# Patient Record
Sex: Male | Born: 1940 | Race: White | Hispanic: No | Marital: Single | Smoking: Never smoker
Health system: Southern US, Community
[De-identification: ages and names within clinical notes are randomized; demographics above are authoritative.]

## PROBLEM LIST (undated history)

## (undated) DIAGNOSIS — I5032 Chronic diastolic (congestive) heart failure: Secondary | ICD-10-CM

## (undated) DIAGNOSIS — I480 Paroxysmal atrial fibrillation: Secondary | ICD-10-CM

## (undated) DIAGNOSIS — I2510 Atherosclerotic heart disease of native coronary artery without angina pectoris: Secondary | ICD-10-CM

## (undated) DIAGNOSIS — I10 Essential (primary) hypertension: Secondary | ICD-10-CM

## (undated) DIAGNOSIS — I77810 Thoracic aortic ectasia: Secondary | ICD-10-CM

## (undated) DIAGNOSIS — I872 Venous insufficiency (chronic) (peripheral): Secondary | ICD-10-CM

## (undated) DIAGNOSIS — I472 Ventricular tachycardia, unspecified: Secondary | ICD-10-CM

## (undated) DIAGNOSIS — J939 Pneumothorax, unspecified: Secondary | ICD-10-CM

## (undated) HISTORY — PX: INGUINAL HERNIA REPAIR: SUR1180

## (undated) HISTORY — DX: Pneumothorax, unspecified: J93.9

## (undated) HISTORY — DX: Paroxysmal atrial fibrillation: I48.0

## (undated) HISTORY — DX: Atherosclerotic heart disease of native coronary artery without angina pectoris: I25.10

## (undated) HISTORY — PX: OTHER SURGICAL HISTORY: SHX169

## (undated) HISTORY — DX: Chronic diastolic (congestive) heart failure: I50.32

## (undated) HISTORY — DX: Essential (primary) hypertension: I10

## (undated) HISTORY — DX: Venous insufficiency (chronic) (peripheral): I87.2

## (undated) HISTORY — DX: Ventricular tachycardia, unspecified: I47.20

## (undated) HISTORY — DX: Ventricular tachycardia: I47.2

## (undated) HISTORY — DX: Thoracic aortic ectasia: I77.810

---

## 2001-12-07 ENCOUNTER — Inpatient Hospital Stay (HOSPITAL_COMMUNITY): Admission: EM | Admit: 2001-12-07 | Discharge: 2001-12-13 | Payer: Self-pay | Admitting: Emergency Medicine

## 2002-02-06 ENCOUNTER — Encounter: Payer: Self-pay | Admitting: Urology

## 2002-02-06 ENCOUNTER — Encounter: Admission: RE | Admit: 2002-02-06 | Discharge: 2002-02-06 | Payer: Self-pay | Admitting: Urology

## 2002-03-09 ENCOUNTER — Encounter (INDEPENDENT_AMBULATORY_CARE_PROVIDER_SITE_OTHER): Payer: Self-pay | Admitting: Specialist

## 2002-03-09 ENCOUNTER — Ambulatory Visit (HOSPITAL_BASED_OUTPATIENT_CLINIC_OR_DEPARTMENT_OTHER): Admission: RE | Admit: 2002-03-09 | Discharge: 2002-03-09 | Payer: Self-pay | Admitting: Urology

## 2002-03-09 ENCOUNTER — Ambulatory Visit (HOSPITAL_COMMUNITY): Admission: RE | Admit: 2002-03-09 | Discharge: 2002-03-09 | Payer: Self-pay | Admitting: Urology

## 2002-03-16 ENCOUNTER — Encounter: Payer: Self-pay | Admitting: Urology

## 2002-03-16 ENCOUNTER — Ambulatory Visit (HOSPITAL_COMMUNITY): Admission: RE | Admit: 2002-03-16 | Discharge: 2002-03-16 | Payer: Self-pay | Admitting: Urology

## 2006-06-01 ENCOUNTER — Encounter: Admission: RE | Admit: 2006-06-01 | Discharge: 2006-06-01 | Payer: Self-pay | Admitting: Cardiology

## 2006-06-04 ENCOUNTER — Inpatient Hospital Stay (HOSPITAL_COMMUNITY): Admission: RE | Admit: 2006-06-04 | Discharge: 2006-06-07 | Payer: Self-pay | Admitting: Cardiology

## 2006-10-20 ENCOUNTER — Ambulatory Visit: Payer: Self-pay | Admitting: Internal Medicine

## 2007-01-05 ENCOUNTER — Ambulatory Visit: Payer: Self-pay | Admitting: Internal Medicine

## 2007-05-11 ENCOUNTER — Ambulatory Visit: Payer: Self-pay | Admitting: Internal Medicine

## 2007-11-11 ENCOUNTER — Ambulatory Visit: Payer: Self-pay | Admitting: Internal Medicine

## 2008-05-10 ENCOUNTER — Ambulatory Visit: Payer: Self-pay | Admitting: Internal Medicine

## 2008-06-17 ENCOUNTER — Encounter: Admission: RE | Admit: 2008-06-17 | Discharge: 2008-06-17 | Payer: Self-pay | Admitting: Family Medicine

## 2008-06-18 ENCOUNTER — Encounter: Admission: RE | Admit: 2008-06-18 | Discharge: 2008-06-18 | Payer: Self-pay | Admitting: Family Medicine

## 2008-08-13 ENCOUNTER — Encounter: Admission: RE | Admit: 2008-08-13 | Discharge: 2008-08-13 | Payer: Self-pay | Admitting: Cardiology

## 2008-08-16 ENCOUNTER — Encounter: Admission: RE | Admit: 2008-08-16 | Discharge: 2008-08-16 | Payer: Self-pay | Admitting: Dermatology

## 2009-05-09 ENCOUNTER — Ambulatory Visit: Payer: Self-pay | Admitting: Internal Medicine

## 2009-05-09 DIAGNOSIS — I10 Essential (primary) hypertension: Secondary | ICD-10-CM | POA: Insufficient documentation

## 2009-05-09 DIAGNOSIS — I4891 Unspecified atrial fibrillation: Secondary | ICD-10-CM

## 2009-05-31 ENCOUNTER — Encounter: Payer: Self-pay | Admitting: Internal Medicine

## 2009-07-08 ENCOUNTER — Inpatient Hospital Stay (HOSPITAL_COMMUNITY): Admission: RE | Admit: 2009-07-08 | Discharge: 2009-07-11 | Payer: Self-pay | Admitting: Orthopedic Surgery

## 2010-05-27 ENCOUNTER — Ambulatory Visit: Payer: Self-pay | Admitting: Internal Medicine

## 2010-05-27 DIAGNOSIS — I495 Sick sinus syndrome: Secondary | ICD-10-CM

## 2010-08-10 ENCOUNTER — Encounter: Payer: Self-pay | Admitting: Cardiology

## 2010-08-19 NOTE — Assessment & Plan Note (Signed)
Summary: per check out/sf   Visit Type:  1 year follow up  CC:  no complaints.  History of Present Illness: [name] is seen in followup for paroxysmal atrial fibrillation occurring in the setting of hypertension. He has had very few palpitation problems over the last year.  He denies chest pain.  He has gone through his knee and hip surgery and he is anticipating going skiing again this winters.  His heart rate and blood pressures the gym regularly have run 120-130 and pulse is 58-60 with an occasional 40  Problems Prior to Update: 1)  Hypertension, Benign  (ICD-401.1) 2)  Atrial Fibrillation  (ICD-427.31)  Current Medications (verified): 1)  Folic Acid 1 Mg Tabs (Folic Acid) .... Once Daily 2)  Flomax 0.4 Mg Caps (Tamsulosin Hcl) .... Two Times A Day 3)  Aspirin 81 Mg Tabs (Aspirin) .... Once Daily 4)  Clonidine Hcl 0.1 Mg Tabs (Clonidine Hcl) .... Two Times A Day 5)  Lipitor 20 Mg Tabs (Atorvastatin Calcium) .... Once Daily 6)  Pindolol 5 Mg Tabs (Pindolol) .... Two Times A Day 7)  Epa 1000 Mg Caps (Omega-3 Fatty Acids) .... Once Daily 8)  Calcium-Vitamin D 500-200 Mg-Unit Tabs (Calcium-Vitamin D) .... Once Daily 9)  Century  Tabs (Multiple Vitamins-Minerals) .... Once Daily 10)  Tylenol Extra Strength 500 Mg Tabs (Acetaminophen) .... 2 Daily 11)  Glucosamine 500 Mg Caps (Glucosamine Sulfate) .... Once Daily  Allergies (verified): No Known Drug Allergies  Past History:  Past Medical History: Last updated: 05/08/2009  Atrial fibrillation - paroxysmal  Coronary artery disease status post bare metal stent to the right coronary artery  Hypertension  Past Surgical History: Last updated: 05/08/2009 Status post inguinal hernia repair x2 in the 1950s and 1970s.  Family History: Last updated: 05/08/2009  Insignificant for early coronary artery disease.  Father deceased, age 80 - natural causes.  Mother deceased, age 60 - natural causes.  Social History: Last  updated: 05/08/2009 Retired  Single  Tobacco Use - No.  Alcohol Use - no Regular Exercise - yes Drug Use - no  Risk Factors: Exercise: yes (05/08/2009)  Risk Factors: Smoking Status: never (05/08/2009)  Vital Signs:  Patient profile:   70 year old male Height:      74 inches Weight:      165.25 pounds BMI:     21.29 Pulse rate:   44 / minute BP sitting:   150 / 70  (left arm) Cuff size:   regular  Vitals Entered By: Caralee Ates CMA (May 27, 2010 9:55 AM)  Physical Exam  General:  The patient was alert and oriented in no acute distress. HEENT Normal.  Neck veins were flat, carotids were brisk.  Lungs were clear.  Heart sounds were regular without murmurs or gallops.  Abdomen was soft with active bowel sounds. There is no clubbing cyanosis or edema. Skin Warm and dry there is prominence of his right scapula   EKG  Procedure date:  05/27/2010  Findings:      sinus rhythm at 48 Intervals 0.18/0.10/0.43 Axis is 77  Impression & Recommendations:  Problem # 1:  CAD, NATIVE VESSEL PRIOR BMS (ICD-414.01) stable continue current meds His updated medication list for this problem includes:    Aspirin 81 Mg Tabs (Aspirin) ..... 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 Once daily    Pindolol 5 Mg Tabs (Pindolol) .Marland Kitchen..Marland Kitchen Two times a day  Problem # 3:  ATRIAL FIBRILLATION (ICD-427.31) no intercurrent symptoms ofatrial fibrillation .b CHADS VASC score is 3- age-57;htn-1, vasc dis-1;  Will need to discuss oral anticoagulation at his next visit His updated medication list for this problem includes:    Aspirin 81 Mg Tabs (Aspirin) ..... Once daily    Pindolol 5 Mg Tabs (Pindolol) .Marland Kitchen..Marland Kitchen Two times a day  Problem # 4:  HYPERTENSION, BENIGN  (ICD-401.1) His blood pressure is modestly elevated. Intercurrently his hydrochlorothiazide has been discontinued. We will continue to monitor it     Patient Instructions: 1)  Your physician recommends that you continue on your current medications as directed. Please refer to the Current Medication list given to you today. 2)  Your physician wants you to follow-up in:  1year You will receive a reminder letter in the mail two months in advance. If you don't receive a letter, please call our office to schedule the follow-up appointment.

## 2010-10-20 LAB — URINALYSIS, ROUTINE W REFLEX MICROSCOPIC
Bilirubin Urine: NEGATIVE
Glucose, UA: NEGATIVE mg/dL
Hgb urine dipstick: NEGATIVE
Ketones, ur: NEGATIVE mg/dL
Nitrite: NEGATIVE
Nitrite: POSITIVE — AB
Protein, ur: 100 mg/dL — AB
Protein, ur: NEGATIVE mg/dL
Specific Gravity, Urine: 1.02 (ref 1.005–1.030)
Urobilinogen, UA: 0.2 mg/dL (ref 0.0–1.0)
pH: 6.5 (ref 5.0–8.0)

## 2010-10-20 LAB — BASIC METABOLIC PANEL
BUN: 9 mg/dL (ref 6–23)
BUN: 9 mg/dL (ref 6–23)
CO2: 27 mEq/L (ref 19–32)
Calcium: 8.1 mg/dL — ABNORMAL LOW (ref 8.4–10.5)
Creatinine, Ser: 1.27 mg/dL (ref 0.4–1.5)
GFR calc Af Amer: >60 mL/min (ref >60)
GFR calc Af Amer: >60 mL/min (ref >60)
GFR calc Af Amer: >60 mL/min (ref >60)
GFR calc non Af Amer: 54 mL/min — ABNORMAL LOW (ref >60)
GFR calc non Af Amer: 56 mL/min — ABNORMAL LOW (ref >60)
GFR calc non Af Amer: >60 mL/min (ref >60)
Potassium: 3.8 mEq/L (ref 3.5–5.1)
Potassium: 4 mEq/L (ref 3.5–5.1)
Sodium: 136 mEq/L (ref 135–145)

## 2010-10-20 LAB — URINE MICROSCOPIC-ADD ON

## 2010-10-20 LAB — CBC
HCT: 26.7 % — ABNORMAL LOW (ref 39.0–52.0)
Hemoglobin: 9.3 g/dL — ABNORMAL LOW (ref 13.0–17.0)
Platelets: 119 10*3/uL — ABNORMAL LOW (ref 150–400)
Platelets: 131 10*3/uL — ABNORMAL LOW (ref 150–400)
RBC: 2.97 MIL/uL — ABNORMAL LOW (ref 4.22–5.81)
RBC: 3.09 MIL/uL — ABNORMAL LOW (ref 4.22–5.81)
RBC: 3.25 MIL/uL — ABNORMAL LOW (ref 4.22–5.81)
WBC: 11 10*3/uL — ABNORMAL HIGH (ref 4.0–10.5)
WBC: 7.8 10*3/uL (ref 4.0–10.5)

## 2010-10-20 LAB — TYPE AND SCREEN: ABO/RH(D): O POS

## 2010-10-20 LAB — PROTIME-INR
INR: 1.48 (ref 0.00–1.49)
INR: 2.72 — ABNORMAL HIGH (ref 0.00–1.49)
Prothrombin Time: 17.8 seconds — ABNORMAL HIGH (ref 11.6–15.2)
Prothrombin Time: 39 seconds — ABNORMAL HIGH (ref 11.6–15.2)

## 2010-10-20 LAB — URINE CULTURE: Culture: NO GROWTH

## 2010-10-20 LAB — ABO/RH: ABO/RH(D): O POS

## 2010-10-21 LAB — CBC
Hemoglobin: 12.1 g/dL — ABNORMAL LOW (ref 13.0–17.0)
MCHC: 33 g/dL (ref 30.0–36.0)
Platelets: 171 10*3/uL (ref 150–400)
RDW: 14.7 % (ref 11.5–15.5)

## 2010-10-21 LAB — COMPREHENSIVE METABOLIC PANEL
ALT: 23 U/L (ref 0–53)
Albumin: 4 g/dL (ref 3.5–5.2)
Alkaline Phosphatase: 64 U/L (ref 39–117)
Calcium: 9.6 mg/dL (ref 8.4–10.5)
Glucose, Bld: 94 mg/dL (ref 70–99)
Potassium: 4.5 mEq/L (ref 3.5–5.1)
Sodium: 141 mEq/L (ref 135–145)
Total Protein: 7 g/dL (ref 6.0–8.3)

## 2010-10-21 LAB — PROTIME-INR
INR: 1.14 (ref 0.00–1.49)
Prothrombin Time: 14.5 seconds (ref 11.6–15.2)

## 2010-12-02 NOTE — Assessment & Plan Note (Signed)
[facility]                         ELECTROPHYSIOLOGY OFFICE NOTE   [name], [name]                       MRN:          [id number]  DATE:01/05/2007                            DOB:          [birth year]    Mr. [name] comes in.  He is having a little bit of atrial palpitations.  He records his heart rate on a daily basis.  Sometimes it is 100,  sometimes it is 50.  His energy level is not much different since we  stopped the Sotalol, notwithstanding the bradycardia.   He is also off the Coumadin.  He is taking aspirin, as well as Vytorin.   EXAMINATION:  His blood pressure is 118/68, his pulse is 65.  LUNGS:  Clear.  HEART SOUNDS:  Regular.   EKG, dated today, demonstrated sinus rhythm at about 55 with occasional  atrial couplets.   IMPRESSION:  1. Atrial fibrillation - paroxysmal.  2. Coronary artery disease with prior stenting and normal left      ventricular function.  3. Relative bradycardia, aggravated by Sotalol.   Mr. [name] is doing fine.  We will plan to see him again in about four  months' time.  In the event that he has more atrial fibrillation, we  will consider the initiation of Tikosyn in the hopes of trying to  maintain his electrical milieu in the atrium so as to minimize the  likelihood of down-the-road atrial fibrillation or persistent atrial  fibrillation, which might detract from success rates for PVI further  down the road.     Duke Salvia, MD, [facility]  Electronically Signed    SCK/MedQ  DD: 01/05/2007  DT: 01/05/2007  Job #: [id number]   cc:   Armanda Magic, M.D.

## 2010-12-02 NOTE — Assessment & Plan Note (Signed)
[name]                         ELECTROPHYSIOLOGY OFFICE NOTE   [name], [name]                       MRN:          [id number]  DATE:05/10/2008                            DOB:          11-29-1940    [name] continues to do well with his atrial fibrillation.  He has  had a little bit of late-day fatigue.  He is not sure whether this is  related to the pindolol or the fact that he is 67 and exercises briskly  each morning.   He is scheduled to go skiing 23 days this winter in the [place].   CURRENT MEDICATIONS:  1. Multiple nutraceuticals.  2. He is also on hydrochlorothiazide 25 mg a day.  3. Lisinopril 5.  4. Aspirin.  5. Vytorin.   PHYSICAL EXAMINATION:  VITAL SIGNS:  His blood pressure is quite  elevated today for him of 160/90; his pulse was 50; and his weight was  170, which is relatively stable.  NECK:  Veins were 7 cm.  LUNGS:  Clear.  HEART:  Sounds were irregular without significant murmurs.  ABDOMEN:  Soft.  EXTREMITIES:  Peripheral edema is 1 to 2+, which was a surprise.   Electrocardiogram dated today demonstrated a sinus rhythm at 51 with  intervals of 0.18/0.09/0.43.  The electrocardiogram was otherwise  normal.   IMPRESSION:  1. Paroxysmal atrial fibrillation.  2. Moderate hypertension.  3. Peripheral edema, question cause.  4. Modest end of the day fatigue.  5. Pindolol therapy for the above.  6. Aspirin therapy for thrombosed embolic prophylaxis.  7. CHADS score of 2.   [name] atrial fibrillation is not particularly asymptomatic.  Continuing on his current medical therapy makes sense.   The more alarming issue is his edema in the context of his hypertension.  This may well be related to increase salt intake as he got back from the  beach where he had been eating pretzels and and chips etc.   I have given him prescription for Lasix 20 to use as an alternative to  his hydrochlorothiazide  for 3 days in the event that modification of his  diet by decreasing salt intake, does not have intermediation effect on  his edema.   We will plan to see him again in 1 year's time.  He will be followed up  with his other physicians as scheduled.     Duke Salvia, MD, [facility]  Electronically Signed    SCK/MedQ  DD: 05/10/2008  DT: 05/11/2008  Job #: [id number]   cc:   Armanda Magic, M.D.

## 2010-12-02 NOTE — Assessment & Plan Note (Signed)
[facility]                         ELECTROPHYSIOLOGY OFFICE NOTE   [name], [name]                       MRN:          [id number]  DATE:05/11/2007                            DOB:          Nov 21, 1940    [name] comes in for follow-up for atrial fibrillation.  He is  largely asymptomatic.  He is currently taking no medications for this.   He continues to exercise, skiing and playing tennis.   On examination, his blood pressure is 134/70, his pulse is 45.  His lungs were clear.  Heart sounds were regular now.  The extremities were without edema.   Electrocardiogram dated today demonstrated sinus rhythm at 75, actually  probably in the range of 45 with intermittent bursts of an atrial  tachycardia, the P wave of which was monophasic, upright in lead V1 and  biphasic in V5.   IMPRESSION:  1. Atrial fibrillation with runs of a nonsustained atrial tachycardia.  2. Intermittent sinus bradycardia.  3. Hypertension.   [name] is doing really quite well.  He has no symptoms.  We had  discussed pursuing further treatment of his atrial arrhythmia but in the  absence of symptoms and the lack of data demonstrating that we can  improve outcome, I think we are a little bit premature in this regard.   We will plan to see him again in 6 months' time.  Maybe there will be  some data at that time demonstrating mortality benefit with early  pulmonary vein isolation, at which point that would be an appropriate  consideration.     Duke Salvia, MD, [facility]  Electronically Signed    SCK/MedQ  DD: 05/11/2007  DT: 05/12/2007  Job #: [id number]

## 2010-12-02 NOTE — Assessment & Plan Note (Signed)
[facility]                         ELECTROPHYSIOLOGY OFFICE NOTE   DARWIN, ROTHLISBERGER                       MRN:          [id number]  DATE:11/11/2007                            DOB:          Jan 17, 1941    Mr. [name] is doing pretty well on his atrial fibrillation.  He was  recently started on pindolol.  He has had a little bit of fatigue, but  nothing too noticeable.  He continues to ski, in fact just got back.   His other medications are hydrochlorothiazide 25, lisinopril 5.  He is  also on Vytorin 10/20.   EXAMINATION:  His blood pressure is 142/84, pulse is 58.  LUNGS:  Clear.  HEART:  Heart sounds were regular.  EXTREMITIES:  Without edema.   IMPRESSION:  1. Paroxysmal atrial fibrillation.  2. Hypertension.   Mr. [name] will continue on the current therapy.  We will plan to sit  down again in about six months' time to see if there are any hard data  on strategies for catheter ablation to reduce hard end points.     Duke Salvia, MD, [facility]  Electronically Signed    SCK/MedQ  DD: 11/11/2007  DT: 11/11/2007  Job #: [id number]   cc:   Armanda Magic, M.D.

## 2010-12-05 NOTE — Discharge Summary (Signed)
[place]  Patient:    [name], [name] Visit Number: [id number] MRN: [id number]          Service Type: MED Location: [id number] Attending Physician:  Armanda Magic Dictated by:   Anselm Lis, N.P. Admit Date:  12/07/2001 Discharge Date: 12/13/2001   CC:         Doylene Canning. Ladona Ridgel, M.D. [facility]  Florencia Reasons, M.D.  Veverly Fells. Arletha Grippe, M.D.  Sigmund I. Patsi Sears, M.D.  Rosanne Sack, M.D.   Discharge Summary  DATE OF BIRTH:  12-09-1940  CONSULTANTS: 1. Doylene Canning. Ladona Ridgel, M.D. [facility], electrophysiology. 2. Florencia Reasons, M.D., gastroenterology. 3. Veverly Fells. Arletha Grippe, M.D., head, eyes, ears, nose, and throat. 4. Sigmund I. Patsi Sears, M.D. (urology). 5. Rosanne Sack, M.D., [facility] hospitalist.  PROCEDURES: 1. Transfusion of a total of four units PRBCs. 2. (Dec 08, 2001) A 2-D echocardiogram revealing hyperdynamic LV systolic    function, EF 75-85%.  Trivial aortic valve regurgitation. 3. (Dec 08, 2001) Dr. Matthias Hughs: EGD revealing erosive/hemorrhagic gastritis    and incidental large hiatal hernia. 4. (Dec 07, 2001) Head CT without contrast revealing no acute intracranial    abnormality.  There is an air-fluid level within the right maxillary sinus    which could represent blood.  DISCHARGE DIAGNOSES:  1. Supraventricular tachycardia.  The patient with a history of palpitations     and irregular heart rate in the past.  On day of admission dizziness and     palpitations as well as nosebleed.  Presented to the emergency room and     was in supraventricular tachycardia, rate of 210 beats per minute,     associated with nasal and oropharyngeal bleeding.  He continued with     tachypalpitations in hospital and was placed on combination beta-blocker     and calcium channel blockers with improvement of his symptoms.  He     converted to normal sinus rhythm.  Admission electrocardiogram     demonstrated what appeared  to be long RP tachycardia at 210 beats per     minute.  Electrophysiology consultation obtained from Dr. Lewayne Bunting who     discussed medical therapy versus catheter ablation of his supraventricular     tachycardia.  The patient expressed preference for medical therapy with     calcium channel blocker and beta blockers as he had done fairly well with     only palpitations.  Dr. Ladona Ridgel felt that his supraventricular tachycardia     appeared to be atrial tachycardia, although he could not rule out the     unusual form of atrioventricular reciprocating tachycardia or a concealed     pathway.  The patient will follow up with Dr. Ladona Ridgel in six weeks     postdischarge.  The patient did have intermittent bursts of atrial     tachycardia versus unusual atrioventricular nodal reentry tachycardia.     His Cardizem was increased to 300 a day and Lopressor 50 mg b.i.d. at time     of discharge.  The patient had a 2-D echocardiogram which revealed     hyperdynamic systolic function, but it was an inadequate study for     evaluation of left ventricular wall motion.  There was trivial aortic     regurgitation.  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  The location of the outflow obstruction     (outflow tract versus midcavity) could not be definitely determined.  TSH     obtained and was within normal range at 0.513.  2. Anemia with gastrointestinal bleed/nasal bleed.  Prior to admission the     patient had hematemesis and subsequent nasal bleed, became presyncopal and     bumped his head on the kitchen floor as he tried to lower himself to the     floor.  Ulcer risk factors include a daily aspirin plus Naprosyn a few     times a week plus fairly regular heavy ethyl alcohol use.  During the     hospital course he did have melanotic stool.  His admission hemoglobin was     11.7 and  dropped to 8.0 on the morning of first hospital day.  Despite     blood transfusion with two units of PRBCs and increase of hemoglobin to     9.4, it again drifted downward to a low of 7.4.  He received two more     units of PRBCs and was discharged with a hemoglobin of 8.9 at time of     discharge without further melanotic stool nor nasal bleed.  He underwent     endoscopy by Dr. Matthias Hughs revealing erosive/hemorrhagic gastritis, question     culprit for his bleed.  There was an incidental large hiatal hernia     identified.  He was initiated on proton pump inhibitor b.i.d. and folic     acid was also added to regimen.  He will follow up with Dr. Matthias Hughs on     Tuesday, June 24, at noon.  The patient will have a followup CBC on     Wednesday, May 28.  Also anticipate a screening colonoscopy as an     outpatient.  3. Nasal bleed.  Nose initially packed at time of admission.  Dr. Arletha Grippe of     ears, eyes, nose and throat did, I believe, place essential packing.  The     nosebleed did resolve and it was without problems by the time of     discharge.  He will follow up with Dr. Arletha Grippe as an outpatient.  His head     CT did reveal air-fluid level within the right maxillary sinus which could     represent blood.  No other acute abnormality.  4. Ethyl alcohol abuse.  The patient had increased use of alcohol apparently     as much as one to two bottles of wine a day for the last several years.     He was initiated on the alcohol abuse detoxication pharmacy protocol and     internal medicine (Dr. Nolon Rod Monguilod, [place] hospitalist) was      consulted.  Dr. Jamie Brookes mentioned that patients potassium, magnesium     and phosphorus abnormalities were typically low in ETOH abuse.  He was     referred to AA as outpatient and this information was provided to him.  5. Hypokalemia.  Supplemented.  His serum potassium was as low as 3.1 and     3.4 at time of discharge.  He will have a followup BMET  on Wednesday,     May 28.  The patient discharged on K-Dur 40 mEq p.o. q.d.  6. Thrombocytopenia.  Admission platelets of 189 as well as 103, 151 at time     of discharge.  A followup CBC as outpatient  scheduled Wednesday, May 28.  7. Low-grade temperature.  As high as 101.6 on Dec 09, 2001, post transfusion     of PRBCs which may have been the culprit.  The patient remained afebrile     during the rest of hospital admission.  His admission WBC was 13.0,     drifting down but then as high as 14.1; 5.8 at time of discharge.  He did     have an initial left shift, but this may have been a stress response.     Neutrophils had normalized by the time of discharge.  8. Hematuria.  Consult obtained by Dr. Lynelle Smoke I. Tannenbaum who noted that     patient had an elevated PSA of 12.1 done screening examination.  Dr.     Patsi Sears also noted that patient had a history of gross hematuria which     had never been evaluated.  Dr. Patsi Sears discussed followup with the     patient and family and plans for a complete urologic evaluation were     forgone until after his daughters wedding which would occurring in     approximately three weeks.  Plan for patient to return to see Dr.     Patsi Sears for repeat PSA as well as a CT and flexible office cystoscopy.     This would allow the patient to intervally obtain treatment for possible     alcoholic withdrawal as well as for his tachyarrhythmia.  9. Admission hyperglycemia.  Glucose as high as 226, 108 at time of     discharge.  His hemoglobin A1c was okay at 5.4.  He was counseled on a     no-concentrated-sweet diet. 10. Symptoms of anxiety.  Started on Celexa. 11. Hypertension.  Anticipate improvement with increasing of Cardizem.  PLAN: 1. The patient discharged home in improved condition. 2. Discharge medications.    A. Protonix 40 mg one p.o. b.i.d.    B. Thiamine (vitamin B1) 100 mg p.o. q.d.    C. Folic acid 1 mg p.o. q.d.    D. Multivitamin once  daily.    E. Lopressor 50 mg one tab p.o. b.i.d.    F. K-Dur 40 mEq p.o. q.d.    G. Celexa 10 mg p.o. q.d. for one week, then 20 mg p.o. q.d.    H. Cardizem CD 300 mg one p.o. q.d. 2. Activity: As tolerated as before. 3. Diet: No concentrated sweets. 4. Followup: Dr. Lewayne Bunting [phone]); the patient to call to be seen    within 4-6 weeks.  Dr. Matthias Hughs [phone]) on Tuesday, June 24, at 12 noon.    Dr. Patsi Sears [phone]) on June 30, at 3:10 p.m.  Dr. Armanda Magic on    Thursday, May 29, at 11:45 a.m.  The patient will report to the office of    Dr. Armanda Magic, [address], Wednesday, May 28, for lab work including a    CBC and BMET.  HISTORY OF PRESENT ILLNESS:  The patient is a very pleasant 70 year old gentleman with a history of palpitations and irregular heartbeat in the past. He also has a history of hypertension and an inguinal hernia repair.  He has had no previous cardiac history and has not seen a medical doctor in 10 years. On the day of admission he awoke with nasal bleed.  Got out of bed with nose intermittently bleeding.  Approximately two hours later he felt dizzy and had presyncopal episode.  He hit his head on the kitchen counter  as he was catching himself to lower himself to the floor.  He did not lose consciousness.  He denied headache or seizure activity.  EMS arrived, blood pressure 150/88, heart rate 102.  His nose was bleeding.  In the ER he had bilateral nasal packing and later went into supraventricular tachycardia at 209 beats per minute which was asymptomatic.  He was given 6 mg adenosine which slowed him down, revealing question sinus tachycardia with PACs versus atrial fibrillation/futter.  He was started on an IV Cardizem drip and went into sinus tachycardia.  Further details of hospital admission as detailed above.  LABORATORY TESTS AND DATA:  Admission WBC 13.0, peak of 14.1, 5.8 at time of discharge.  Initial left shift, normalizing by time of  discharge.  Admission hemoglobin of 11.7 with hematocrit 34.4, as low as 7.4 on May 25; 8.9 at time of discharge.  Platelets on admission 189, as low as 103; 151 at time of discharge.  Admission pro time 13.9, INR of 1.0.  Admission sodium 143. Admission potassium of 4; as low as 3.1; supplemented; 3.4 at time of discharge.  Chloride was 108, CO2 22.  Glucose on admission 226 which was the highest he exhibited; 108 at time of discharge.  Admission calcium of 8.0, 8.7 at time of discharge.  Total protein and albumin decreased at 5.2 and 2.9, respectively.  AST elevated at 62, normalizing to 33.  Other LFTs within normal range save for low-normal magnesium at 1.7 and low phosphorus at 1.8. Hemoglobin A1c within normal range at 5.4.  CK of 218 with MB fraction 2.4, troponin I 0.04; second CK of 265, MB fraction 1.3, troponin I 0.03; third CK of 305, MB fraction 2.3, troponin I 0.04.  Cholesterol 159, triglycerides 87, HDL 70, LDL 72.  TSH within normal range at 0.513.  Serum iron elevated at 274, TIBC not calculated, RBC/folate elevated at 679.  PSA elevated at 12.10. UA negative.  Type and cross-match revealed ABO type O, Rh positive, antibody screen negative.  Admission chest x-ray revealed no active CAD.  Total time period for discharge greater than 40 minutes including filling out and reviewing discharge instructions with the patient, calling of prescriptions, dictating this discharge note. Dictated by:   Anselm Lis, N.P. Attending Physician:  Armanda Magic DD:  01/27/02 TD:  01/27/02 Job: [id number]

## 2010-12-05 NOTE — Cardiovascular Report (Signed)
NAMECHER, EGNOR NO.:  [id number]   MEDICAL RECORD NO.:  [id number]          PATIENT TYPE:  OIB   LOCATION:  2899                         FACILITY:  [facility]   PHYSICIAN:  Lyn Records, M.D.   DATE OF BIRTH:  [birth year]   DATE OF PROCEDURE:  06/04/2006  DATE OF DISCHARGE:                            CARDIAC CATHETERIZATION   INDICATIONS FOR PROCEDURE:  Early positive exercise treadmill test with  high grade mid to distal right coronary artery stenosis.   PROCEDURE PERFORMED:  1. Percutaneous transluminal coronary angiography of the right      coronary.  2. Intravascular ultrasound of the right coronary.  3. Bare metal stent overlapping implantation in the mid to distal      right coronary artery.   DESCRIPTION:  After informed consent, a 7-French sheath was exchanged  for the 6-French sheath that had been previously placed.  We also placed  a 6-French venous sheath.  We had concerns about heavy calcification  noted on cinefluoroscopy throughout the patient's entire coronary bed.  We attempted to intravascular ultrasound pre-dilatation, but were unable  to cross the lesion with the ultrasound catheter.  We predilated with a  2-mm Quantum balloon.  We then did intravascular ultrasound to size the  distal and proximal lesion segments and also to look at the distribution  of calcium within the vessel.  Calcium was deep.  The distal and  proximal margins were in the 3.75 to 4.1 mm diameter size.  The lesion  was felt to be approximately 14 mm in length.   We then deployed a 16 mm x 3.5 mm Liberty balloon to 14 atmospheres, two  balloon inflations were performed.  There was a region distal to the  stented area margin that was somewhat hazy and a second 12 mm x 3.5  Liberty was overlapped with the first stent.  Post dilatation was then  performed with a 4 x 12-mm Quantum balloon to 14 atmospheres.  The  distal margin of the mid vessel and the proximal margin  were stented.  The patient tolerated the procedure without complications.  IVUS was  done post procedure and the minimal luminal diameter within the stented  segment was nearly 9 mm square.  Angiographically, the lesion was  reduced from 95% to 0% with TIMI grade III flow.   The patient received 5600 units of IV heparin and intravenous Integrilin  double bolus followed by an infusion.  The patient also received 300 mg  of Plavix.  No significant complications occurred.   Atlantis intravascular ultrasound catheter was used.  Asahi Prowater  guidewire was used. A 7-French side-hole JR-4 guide catheter was used.  A 2 mm x 12 mm Quantum predilatation balloon was used.   CONCLUSION:  Successful stent of the mid to distal right coronary artery  from 99% to 0% with TIMI III flow with final post dilatation with a 4  balloon and minimal luminal area of approximately 9 mm square post  procedure.   PLAN:  Aspirin and Plavix.  The Plavix should be continued at least  three weeks.  Further  management per Dr. Mayford Knife.      Lyn Records, M.D.  Electronically Signed     HWS/MEDQ  D:  06/04/2006  T:  06/04/2006  Job:  [id number]   cc:   Ladell Pier, M.D.  Armanda Magic, M.D.

## 2010-12-05 NOTE — Consult Note (Signed)
[facility]  Patient:    [name], [name] Visit Number: [id number] MRN: [id number]          Service Type: MED Location: 3W [phone] Attending Physician:  Armanda Magic Dictated by:   Doylene Canning. Ladona Ridgel, M.D. [facility] Admit Date:  12/07/2001 Discharge Date: 12/13/2001   CC:         Armanda Magic, M.D.  Florencia Reasons, M.D.  Kathrine Cords   Consultation Report  REASON FOR REFERRAL:  Patient is referred by Dr. Mayford Knife for evaluation of SVT.  HISTORY OF PRESENT ILLNESS:  The patient is a very pleasant 70 year old man with a history of palpitations and irregular heartbeat in the past.  He also has a history of hypertension and an inguinal hernia repair.  He was in his usual state of health until Dec 08, 2001 when he had dizziness and palpitations and a nose bleed.  He presented to the emergency department where he seemed to be in SVT at a rate of 210 beats per minute in association with nasal and oropharyngeal bleeding.  He subsequently underwent GI evaluation demonstrated a hiatal hernia and erosive esophagitis.  In the hospital he continued to have tachy palpitations and was placed on a combination of beta blockers and calcium channel blockers with improvement in his symptoms.  He is now referred for evaluation.  The patient denies a history of syncope.  SOCIAL HISTORY:  The patient is widowed.  He drinks three and a half glasses of wine per day.  MEDICATIONS ON ADMISSION:  Aspirin, multivitamins.  In the hospital he was subsequently placed on Lopressor 50 mg b.i.d. and Cardizem 240 mg q.d. in conduction with protime pump blockers, thiamine, and multivitamins.  REVIEW OF SYSTEMS:  Notable for no problems with headache or vision or hearing problems.  He wears glasses for visual acuity.  He has had some problems with his gait for approximately one year.  He denies nausea and vomiting in the past.  He has a remote history of hematuria and has had approximately  a 5 pound weight gain but otherwise his review of systems was unremarkable with no arthritic symptoms complained of.  PHYSICAL EXAMINATION  GENERAL:  He is a pleasant, well-appearing, middle-aged man in no distress.  VITAL SIGNS:  Blood pressure 164/98, pulse 65 and regular, respirations 20.  CARDIOVASCULAR:  Irregular rhythm with a grade 2/6 systolic murmur at the apex.  LUNGS:  Clear bilaterally with no wheezes, rhonchi, or rales.  ABDOMEN:  Soft, nontender, nondistended. There was no obvious organomegaly.  EXTREMITIES:  No clubbing, cyanosis, edema.  There was trace to 1+ peripheral edema bilaterally.  LABORATORIES:  EKG demonstrates normal sinus rhythm.  Previous ECGs demonstrate what appears to be a long RP tachycardia at 210 beats per minute. Other telemetry strips demonstrate what appear to be atrial fibrillation.  IMPRESSION: 1. Recurrent supraventricular tachycardia. 2. Hypertension. 3. History of gastrointestinal bleeding/questionable epistaxis.  DISCUSSION:  I have discussed the treatment options with Mr. Taddei in some detail along with his daughter.  Options include proceeding with catheter ablation of his SVT.  The other option would be initial trial of medical therapy.  Risks, benefits, goals, and expectations of each treatment option have been given to the patient in a fair amount of detail and he would prefer to try medical therapy for now as he has not been particularly symptomatic in the past and has done fairly well with only palpitations since his calcium channel blockers and beta blockers have become  therapeutic.  Looking at his SVT, it appears to be atrial tachycardia, although we certainly could not rule out the usual form of AVRT.  In addition, he could have a concealed pathway. Dictated by:   Doylene Canning. Ladona Ridgel, M.D. [facility] Attending Physician:  Armanda Magic DD:  12/13/01 TD:  12/14/01 Job: [id number]

## 2010-12-05 NOTE — Discharge Summary (Signed)
NAMEKENTRAIL, SHEW              ACCOUNT NO.:  [id number]   MEDICAL RECORD NO.:  [id number]          PATIENT TYPE:  INP   LOCATION:  2035                         FACILITY:  [facility]   PHYSICIAN:  Armanda Magic, M.D.     DATE OF BIRTH:  May 26, 1941   DATE OF ADMISSION:  06/04/2006  DATE OF DISCHARGE:                               DISCHARGE SUMMARY   DISCHARGE DIAGNOSES:  1. Paroxysmal atrial fibrillation, currently in normal sinus rhythm.  2. On sotalol.  3. Coronary artery disease status post bare metal stent to the right      coronary artery.  4. History of ventricular tachycardia, nonsustained.  5. History of palpitations.  6. Systemic anticoagulation.  7. Hypertension.  8. Long-term medication use.   ALLERGIES:  NO KNOWN DRUG ALLERGIES.   HISTORY OF PRESENT ILLNESS:  Mr. Staples is a 70 year old male patient  who has palpitations as well as proven wide-complex tachycardia on  Holter monitor. Apparently, for these reasons,  he underwent an exercise  treadmill test which was inducible for ischemia. He was then sent for  cardiac catheterization on June 04, 2006. The catheterization  revealed a 95% RCA lesion, and Dr. Katrinka Blazing implanted a bare metal stent  without difficulty.   The patient has known PAF and he was kept in the hospital and  transitioned from flecainide to sotalol.  He was watched over 72 hours,  and on discharge, he had remained stable, and his QTc was approximately  420 msec. He was discharged to home in stable condition. His INR is 1.2,  and we will go ahead and resume his Coumadin as prior to admission.   DISCHARGE MEDICATIONS:  Include folic acid daily; potassium 20 mEq a  day; Flomax 0.4 mg a day; hydrochlorothiazide 25 mg a day; omega-3 fatty  acids twice a day; Voltaren as before; calcium, magnesium, and zinc  daily; lisinopril 5 mg a day; Coumadin as prior to admission; Plavix 75  mg a day; baby aspirin daily; sotalol 80 mg twice a day; and sublingual  nitroglycerin p.r.n. chest pain.   He is to stop his Cardizem, Toprol, and flecainide.   He is to clean his right groin cath site with soap and water. Do not  scrub the area. He is not to drive for two days. No lifting over 10  pounds for one week. He is to follow with Kizzie Furnish, PA-C, at [facility], on June 09, 2006 at 11:30 a.m. At 11:15 a.m., he will  see our pharmacist, Riki Rusk, for an INR check. He is to follow up with  Dr. Armanda Magic on June 18, 2006 at 1:30 p.m.      Guy Franco, P.A.      Armanda Magic, M.D.  Electronically Signed    LB/MEDQ  D:  06/07/2006  T:  06/07/2006  Job:  [id number]

## 2010-12-05 NOTE — Cardiovascular Report (Signed)
[name], [name]              ACCOUNT NO.:  [id number]   MEDICAL RECORD NO.:  [id number]          PATIENT TYPE:  INP   LOCATION:  2035                         FACILITY:  [facility]   PHYSICIAN:  [name], M.D.     DATE OF BIRTH:  17-Jun-1941   DATE OF PROCEDURE:  06/04/2006  DATE OF DISCHARGE:                            CARDIAC CATHETERIZATION   PROCEDURE:  Left heart catheterization, coronary angiography, left  ventriculography.   OPERATOR:  [name], M.D.   INDICATIONS:  Abnormal exercise treadmill test.   COMPLICATIONS:  None.   IV access via right femoral artery 6-French sheath.   This is a very pleasant 70 year old white male with a history of  paroxysmal atrial fibrillation on flecainide, now with palpitations and  Holter monitor showing wide complex tachycardia, question whether this  is nonsustained VT from her proarrhythmia or ischemia versus atrial  flutter with aberration.  The patient had a stress exercise treadmill  test with 2 to 3 mm of downsloping ST-segment depression and now  presents for cardiac catheterization.   The patient was brought to the cardiac catheterization laboratory in a  fasting nonsedated state.  Informed consent was obtained.  The patient  was connected to continuous heart rate and pulse oximetry monitoring and  intermittent blood pressure monitoring.  The right groin was prepped and  draped in sterile fashion.  1% Xylocaine was used for local anesthesia.   Using modified Seldinger technique, a 6-French sheath was placed in the  right femoral artery.  Under fluoroscopic guidance, a 6-French JL-4  catheter was placed in the left coronary artery.  Multiple cine films  were taken at 30-degree RAO and 40-degree LAO views.  This catheter was  then exchanged out over a guidewire for a 6-French JR-4 catheter which  was placed under fluoroscopic guidance in the right coronary artery.  Multiple cine films were taken in 30-degree RAO and  40-degree LAO views.  This catheter was then exchanged out over a guidewire for a 6-French  angled pigtail catheter which was placed in the left ventricular cavity.  Left ventriculography was performed in the 30-degree RAO view using  total of 30 mL of contrast at 15 mL per second.  The catheter was then  pulled back across the aortic valve, with no significant gradient noted.  At the end of the procedure, all catheters and sheaths were removed.  Manual compression was performed until adequate hemostasis was obtained.   The patient was transferred back to his room in stable condition.   RESULTS:  1. The left main coronary artery is heavily calcified and bifurcates      into the left anterior descending artery and left circumflex      artery.  2. The left anterior descending artery is heavily calcified in its      proximal portion.  There is a 30-40% ostial narrowing and then a 20-      30% proximal narrowing.  The LAD gives off a first diagonal branch      which is widely patent.  The LAD is widely patent in its  midportion, giving rise to a second diagonal which is widely      patent.  In the mid to distal portion of the LAD after the takeoff      of the second diagonal, was a 90% stenosis distally and a small      vessel.  3. The left circumflex is widely patent throughout its course in the      AV groove, giving rise to a high first obtuse marginal branch which      has a 50% narrowing.  It then gives off a second obtuse marginal      branch which is widely patent and bifurcates into 2 daughter      branches which are widely patent.  4. The right coronary artery is heavily calcified throughout its      course, with a 90% mid to distal lesion.  It bifurcates into the      posterior descending artery and posterolateral artery, both of      which are widely patent.  5. Left ventriculography shows normal LV systolic function with an EF      of 50%.  Left ventricular pressure 132/2  mmHg, aortic pressure      148/74 mmHg.   ASSESSMENT:  1. Two-vessel obstructive coronary disease with a 90% right coronary      artery heavily calcified and 80% distal left anterior descending.  2. Normal left ventricular function.  3. Paroxysmal atrial fibrillation.   PLAN:  PCI of the RCA per Dr. Katrinka Reyes.  Medical management of the LAD  lesion given how distally it is and such a small vessel distally.  Continue aspirin.  Start Plavix.  Restart Coumadin.  I discussed with  Dr. Sampson Reyes the results of the catheterization, and it was decided  that we would discontinue the flecainide and start sotalol 80 mg b.i.d.      [name], M.D.  Electronically Signed     TT/MEDQ  D:  06/07/2006  T:  06/07/2006  Job:  [id number]   cc:   [name], M.D.

## 2010-12-05 NOTE — Consult Note (Signed)
[facility]  Patient:    [name], [name] Visit Number: [id number] MRN: [id number]          Service Type: MED Location: 3W [phone] Attending Physician:  Armanda Magic Dictated by:   Florencia Reasons, M.D. Proc. Date: 12/08/01 Admit Date:  12/07/2001   CC:         Armanda Magic, M.D.   Consultation Report  REASON FOR CONSULTATION:  Dr. Candace Cruise, covering for Dr. 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  [name] was admitted to the hospital yesterday because of supraventricular tachycardia, syncope, and a nose bleed.  On further questioning, the patient also had hematemesis before the nose bleed started, became presyncopal, and bumped his head on the kitchen counter as he tried to lower himself to the floor.  He has ulcer risk factors of the use of a daily baby aspirin plus Naprosyn a couple of times a week, plus fairly regular heavy alcohol use.  However, there is no prior history of GI bleeding nor any prodrome of dyspeptic symptomatology.  Last night, he had a significant melanic stool, and he has dropped his hemoglobin overnight from 11.7 to a current level of 8.2.  He has been intermittently hypotensive, but this has been associated with him being in and out of supraventricular tachycardia, which may be contributing to the hypotension.  ALLERGIES:  No known drug allergies.  OUTPATIENT MEDICATIONS: 1. Daily aspirin. 2. Vitamin supplements. 3. Glucosamine. 4. Herbal remedies. 5. Magnesium and potassium supplements.  CURRENT MEDICATIONS IN THE HOSPITAL: 1. Keflex. 2. Ativan. 3. Vitamins. 4. Thymine. 5. P.r.n. antacids. 6. P.r.n. intravenous Diltiazem.  PAST SURGICAL HISTORY:  I do not believe there is any prior history of abdominal surgery other than inguinal hernia repair on two occasions, 1952 and 1969.  CHRONIC MEDICAL ILLNESSES: 1. Hypertension of about 40 years  duration. 2. Currently diagnosed supraventricular tachycardia.  No known history of    myocardial infarction, chronic obstructive pulmonary disease, or diabetes.  HABITS:  Nonsmoker, moderate ethanol, increased recently, apparently as much as 1 to 2 bottles of wine a day for the past several years.  FAMILY HISTORY:  Negative for GI tract illnesses such as colon cancer, ulcers, liver disease, or gallstones.  SOCIAL HISTORY:  The patient is widowed and lives by himself.  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  REVIEW OF SYSTEMS:  Negative for anorexia, involuntary weight loss (he actually thinks he has gained about 5 pounds in the past month or two), dyspepsia, significant reflux (does have occasional diet related heartburn), or prior history of melanic stools.  PHYSICAL EXAMINATION:  GENERAL:  [name] is a pleasant, articulate, relaxed individual, perhaps slightly sedated from Ativan.  VITAL SIGNS:  Heart rate about 112, systolic pressure about 120.  Respirations unlabored.  HEENT:  He is anicteric.  He has some slight facial pallor.  CHEST:  Clear to auscultation anteriorly, and at this time, the heart has a regular rhythm without gallops, rubs, murmurs, or clicks appreciated.  ABDOMEN:  Soft and without overt organomegaly, guarding, mass, tenderness, hepatomegaly, or overt ascites.  RECTAL:  A rectal examination was not performed since the patient just had a melanic stool a short while ago observed by the nurse.  LABORATORY DATA:  Admission hemoglobin was 11.7, and current hemoglobin is 8.2, down about a gram from this morning.  White count 12,400, with a diff earlier today being 73  polys, 12 lymph, no eosinophils.  MCV 95.  Platelet count 155,000.  Chemistry panel pertinent for elevation of BUN.  It was 22 on admission, and is 45 this morning.  Potassium 3.9 currently.  Albumin 3.6 prior to hydration.  CK-MBs negative.   Troponin-I negative.  Cholesterol 159. Serum iron elevated at 274.  Liver chemistries:  Bilirubin 0.6, alkaline phosphatase normal at 62, SGOT slightly elevated at 62, SGPT normal at 40, albumin 3.6 as noted.  IMPRESSION: 1. History of hematemesis and melanic stools with posthemorrhagic anemia in a    patient on aspirin and nonsteroidal anti-inflammatory drugs. 2. Gastrointestinal bleeding.  Picture is somewhat confused by a history of    nose bleed in association with this bleeding, but apparently starting after    the bleeding began.  I wonder whether this could actually have been blood    reflux stopped into his nose during the active hematemesis. 3. History of significant ethanol consumption.  PLAN:  Procedure to endoscopic evaluation this afternoon.  The patient is currently in the process of being transfused 2 units, so we will plan to do it towards the end of that transfusion later this afternoon.  We will initiate a proton pump inhibitor empirically.  Ultimately, the patient may be a candidate for screening colonoscopy, but that would need to be done electively after his acute medical problems are sorted out, most likely as an outpatient.  In the meantime, the patient will be started on a proton pump inhibitor. Dictated by:   Florencia Reasons, M.D. Attending Physician:  Armanda Magic DD:  12/08/01 TD:  12/11/01 Job: [id number]

## 2010-12-05 NOTE — Letter (Signed)
October 20, 2006    [name], M.D.  [address], [address]  [place], [place] [address]   RE:  [name]  MRN:  [id number]  /  DOB:  09-Jan-1941   Dear [name]:   It was a pleasure to see [name] at your request today regarding his  atrial fibrillation.  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  He does not recall it being sustained.  He does not recall ever having a cardioversion.  But ultimately, because  of your friend [name]'s connection with [name], he saw [name]  with consideration of ablation of this arrhythmia.  It was elected to  put him on flecainide.  He apparently had a stress test that was not  entirely normal.  Flecainide was started anyway.  Subsequently in the  fall of 2007, repeat stress testing was concerning, and he underwent  catheterization, and was found to have 2-vessel disease for which he  underwent bare-metal stenting, and flecainide was discontinued and  sotalol was initiated.   He has been on Coumadin for about a year.  His thromboembolic risk  factors are notable for hypertension.  They are otherwise negative on a  CHADS score.   The patient has very few palpitations, and he has no symptoms that he  can attribute to his atrial arrhythmia.   I just got off the phone talking to you, and you mentioned that he had  an atrial tachycardia.  We will have to look at that electrocardiogram  way back when.   He has also had problems with bradycardia, and that was part of the  hesitation for using the sotalol, and that is an issue ongoing.   Your progress notes also list a history of ventricular tachycardia,  though I do not see that.  I do know there is significant aberration on  his Holter monitor.   CURRENT MEDICATIONS:  Include:  1. Flomax.  2. Hydrochlorothiazide 25.  3. Lisinopril 5.  4.  Coumadin.  5. Aspirin.  6. Sotalol at 80 b.i.d.   HE HAS NO KNOWN DRUG ALLERGIES.   SOCIAL HISTORY:  He lives by himself.  He is very active skiing and  traveling frequently.  He does not use cigarettes or recreational drugs.  He does alcohol now a couple of times a year, he says.  This is a  considerable change from what was done previously, apparently.   There are remote hernias.   REVIEW OF SYSTEMS:  Apart from above, is notable for arthritis.  It is  otherwise negative across multiple organ systems.   EXAMINATION:  Today his blood pressure is 122/72.  His pulse was 44 and  regular.  His weight was 172.  HEENT EXAM:  Demonstrated no icterus or xanthoma.  NECK:  Veins were flat.  The carotids were brisk and full bilaterally  without bruits.  BACK:  Was without kyphosis or scoliosis.  LUNGS:  Were clear.  HEART:  Sounds were regular.  There was an S4.  ABDOMEN:  Soft with active bowel sounds.  __________  hepatosplenomegaly.  Femoral pulses were 2+.  Distal pulses were intact.  There was no  clubbing, cyanosis, or edema.  NEUROLOGIC EXAM:  Was grossly normal.   Electrocardiogram dated from your office showed heart rates in the mid  40s.  Holter demonstrated heart rates from 33 to  about 130, though the  latter were tachycardia.  The maximum regular heart rate appeared to be  about 100.   IMPRESSION:  1. Nonsustained atrial tachycardia with and without aberration.  2. Thromboembolic risk factors notable for hypertension.  3. History of atrial fibrillation.  4. History of ventricular tachycardia has been nonsustained, question      documentation.  5. Coronary artery disease.      a.     Normal left ventricular function.      b.     Status post BMS to the left anterior descending and the       right coronary artery.  6. Bradycardia.   [name], [name] has atrial tachycardia, and he may have atrial  fibrillation.  Thromboembolic risk stratification with new CHADS   guideline and the intermediate risk attributable to hypertension alone  suggests that Coumadin may not be necessary, and that aspirin would be a  reasonable alternative, at the dose of 81 to 325 mg now being clearly  set.  So, it was suggested that he continue on his Coumadin.   It would be appropriate also to discontinue his sotalol, as is having  not trivial bradycardia, although it is not clearly symptomatic.  In the  event that he has further arrhythmias that are sustained, Tikosyn or  catheter approach might be reasonable that time.   I reviewed the above with him.  He understands, and is glad to proceed  that way.   Thank you again for asking [place] to see him.    Sincerely,      Jesse Salvia, MD, [facility]  Electronically Signed    SCK/MedQ  DD: 10/20/2006  DT: 10/20/2006  Job #: [id number]   CC:    [name], M.D.

## 2010-12-05 NOTE — Procedures (Signed)
[facility]  Patient:    [name], [name] Visit Number: [id number] MRN: [id number]          Service Type: MED Location: 3W 0349 01 Attending Physician:  Armanda Magic Dictated by:   Florencia Reasons, M.D. Proc. Date: 12/08/01 Admit Date:  12/07/2001   CC:         Armanda Magic, M.D.   Procedure Report  PROCEDURE:  Upper endoscopy.  INDICATIONS FOR PROCEDURE:  This is a 70 year old gentleman admitted to the hospital yesterday with a history of hematemesis, nose bleeds and a significant drop in hemoglobin to approximately 8. He has also had hypotension and SVT. He was on aspirin prior to admission on a fairly regular basis. He also drinks significant amounts of alcohol. This is being done to exclude aspirin or alcohol induced upper tract source of bleeding. He has had melenic stool but it is unclear whether this may be arising from blood swallowed from the nose bleed.  FINDINGS:  No active bleeding. Moderately severe erosive and hemorrhagic esophagitis with focal ulcerations. Large hiatal hernia.  DESCRIPTION OF PROCEDURE:  The nature, purpose and risk of the procedure had been discussed with the patient who provided written consent. The procedure was done at the bedside in the intensive care unit. The patient had received Ativan earlier today and was somewhat somnolent so further sedation was not administered. Cetacaine spray for topical pharyngeal anesthesia was given and we used an adult small caliber GIF-160 Olympus endoscope which was passed under direct vision. The vocal cords looked normal. The esophagus was readily entered.  The proximal esophagus was unremarkable but the distal esophagus, beginning at about 35 cm, had marked erosive changes in a semicircumferential pattern, extending about 5 cm above the squamocolumnar junction. Interspersed among this area of confluent exudate were areas of focal hemorrhage and apparent ulceration.  However, there was no adherent clot or obvious visible vessel. 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 There was what appeared to be an esophageal ring right at the level of the gastroesophageal junction. This was at 40 cm. Below this was an 8 cm hiatal hernia, with a diaphragmatic hiatus located at about 48 cm from the mouth.  The stomach contained no blood or significant coffee-ground material, just a few hemorrhagic flecks here and there. No gastritis, erosions, ulcers or polyps or masses were observed and specifically no aspirin-related gastropathy or NSAID induced gastric irritation was noted.  Retroflexed viewing of the proximal stomach showed a rather patulous diaphragmatic hiatus probably measuring about 8 cm across and the large hiatal hernia was seen from its inferior perspective.  The pylorus, duodenal bulb, and second duodenum were carefully inspected. In fact, these areas were inspected several times to confirm the absence of any ulceration. None was seen. There were some questionable duodenal diverticula in the post bulbar region of the second duodenum. However, no ulceration was seen.  I elected not to biopsy the esophagus in view of the patients recent hemorrhage.  The scope was removed from the patient who tolerated the procedure quite well and without any apparent complication.  IMPRESSION:  1. No evidence of active bleeding at the time of this exam.  2. Minimal coffee-ground material in the duodenum and a few flecks in the     stomach but no fresh blood.  3. Moderately severe erosive, hemorrhagic,  and focally ulcerative distal     esophagitis.  4. Large (8 cm) hiatal hernia.  5. No evidence of aspirin induced or NSAID induced gastropathy or ulcers.  PLAN:  1. High dose PPI  therapy.  2. Follow-up endoscopy in a couple of months to confirm healing of the     esophagus.  3. It is unclear to what degree, if any, the patients esophagitis has     contributed to his melena and anemia, but I think it is quite possible     that he did bleed from his esophagitis.  4. The endoscopic appearance does not suggest a high risk of rebleeding. Dictated by:   Florencia Reasons, M.D. Attending Physician:  Armanda Magic DD:  12/08/01 TD:  12/12/01 Job: [id number]

## 2010-12-05 NOTE — H&P (Signed)
NAMEPREET, PERRIER              ACCOUNT NO.:  [id number]   MEDICAL RECORD NO.:  [id number]          PATIENT TYPE:  OIB   LOCATION:  2899                         FACILITY:  [facility]   PHYSICIAN:  Armanda Magic, M.D.     DATE OF BIRTH:  May 15, 1941   DATE OF ADMISSION:  06/04/2006  DATE OF DISCHARGE:                              HISTORY & PHYSICAL   PRIMARY CARE PHYSICIAN:  Ladell Pier, M.D. at [facility]  at [facility].   HISTORY OF PRESENT ILLNESS:  Mr. Dennard is a 70 year old Caucasian male  with a history of atrial fibrillation, now maintaining sinus bradycardia  on Cardizem and flecainide, systemic anticoagulation on Coumadin, and  hypertension.  The patient was having palpitations and was noted to have  wide complex tachycardia on a Holter monitor.  He then presented for an  outpatient exercise treadmill test at [facility] which was  positive for inducible ischemia.  Today he presents to the [facility] for an outpatient diagnostic cardiac catheterization to rule  out ischemia.  The patient denies chest pain, shortness of breath,  nausea, vomiting, diaphoresis, and dizziness.   PAST MEDICAL HISTORY:  1. Hypertension.  2. Paroxysmal atrial fibrillation, now maintaining sinus bradycardia.  3. History of SVT.  4. Systemic anticoagulation on Coumadin, followed in the [facility].  5. History of a systolic murmur in the past with very mild outflow      tract gradient of 25 mm at rest by echo in 2003.  6. History of pneumothorax in 1960s.  7. Low back pain   ALLERGIES:  No known drug allergies.   HOME MEDICATIONS:  1. Folic acid daily.  2. K-Dur 20 mEq daily.  3. Cardizem CD 300 mg daily.  4. Flomax 0.4 mg daily.  5. HCTZ 25 mg daily.  6. Vitamin B 2 tablets daily.  7. Vitamin D daily.  8. Toprol XL 50 mg daily.  9. Omega 3 fatty acids 1000 mg twice daily.  10.Voltaren 2 tablets daily.  11.Calcium  12.Magnesium.  13.Zinc.  14.Lisinopril 5 mg daily.  15.Flecainide 50 mg twice daily.  16.Coumadin take as directed.   PAST SURGICAL HISTORY:  Status post inguinal hernia repair x2 in the  1950s and 1970s.   FAMILY HISTORY:  Insignificant for early coronary artery disease.  Father deceased, age 33 - natural causes.  Mother deceased, age 79 - natural causes.   SOCIAL HISTORY:  Single with two adult daughters - both healthy.  Lives  alone.  Retired Cytogeneticist.  Former smoker for 4  years with cessation 43 years ago.  Currently denies tobacco or illicit  drug use.  However, admits to occasional alcohol use.  He currently  maintains a consistent exercise regimen, exercises six to seven times  per week for 1-1/2 hours each time during cardio and strength training.  He admits to 16-24 ounces of caffeine daily.   REVIEW OF SYSTEMS:  Positive for joint pain as well as low back pain.   PHYSICAL EXAMINATION:  GENERAL:  A 70 year old male, pleasant and  cooperative, NAD.  VITALS:  Temperature 97.3 pulse 52, respirations 16, blood pressure  143/72, O2 saturation is 95 on room air.  Height 6 feet 2 inches, weight  180 pounds.  HEENT: Unremarkable.  NECK: Supple without JVD or carotid bruits, bilaterally.  Carotid  upstrokes 2+.  PULMONARY:  Breath sounds are equal and clear to auscultation,  bilaterally.  No use of accessory muscles.  CARDIOVASCULAR:  Bradycardic.  Normal S1, S2 with a 1/6 systolic murmur.  ABDOMEN:  Soft, nontender, nondistended with active bowel sounds.  No  masses, organomegaly, or bilateral bruits.  EXTREMITIES: No peripheral edema.  DP and PT pulses 2+/2, bilaterally.  Femoral pulses 2+/2, bilaterally.  SKIN:  Warm and dry without rashes or lesions.  NEURO: No focal motor or sensory deficits.  PSYCH:  Normal mood and affect.  BACK:  No kyphosis or scoliosis.   LABORATORY DATA:  PT 15.6, INR 1.2, PTT 34.  Chest x-ray, EKG, CBC and  __________ were obtained on  an outpatient basis prior to the cardiac  catheterization.   ASSESSMENT AND PLAN:  1. Positive exercise treadmill test for inducible ischemia.  2. Atrial fibrillation now in sinus bradycardia.  3. Hypertension.  4. Systemic anticoagulation on Coumadin, being followed in the [facility].  5. Cardiac catheterization with possible PCI.  The catheterization      procedure, risks, and potential complications      have been explained to the patient by Dr. Mayford Knife including MI, CVA,      death, IV contrast dye allergy, renal insufficiency, and vascular      complications including bleeding, hematoma, and pseudoaneurysm.      The patient admits to understanding and wishes to proceed.      Tylene Fantasia, [place]      Armanda Magic, M.D.  Electronically Signed    RDM/MEDQ  D:  06/04/2006  T:  06/04/2006  Job:  [id number]   cc:   Ladell Pier, M.D.

## 2010-12-05 NOTE — Op Note (Signed)
[name], RAMROOP                         ACCOUNT NO.:  [id number]   MEDICAL RECORD NO.:  [id number]                   PATIENT TYPE:  AMB   LOCATION:  DAY                                  FACILITY:  [facility]   PHYSICIAN:  Sigmund I. Patsi Sears, M.D.         DATE OF BIRTH:  06-19-1941   DATE OF PROCEDURE:  DATE OF DISCHARGE:                                 OPERATIVE REPORT   PREOPERATIVE DIAGNOSES:  Elevated prostate-specific antigen, bladder outlet  obstruction, history of bladder diverticular formation, history of bilateral  hydronephrosis right greater than left.   POSTOPERATIVE DIAGNOSES:  Elevated prostate-specific antigen, bladder outlet  obstruction, history of bladder diverticular formation, history of bilateral  hydronephrosis right greater than left.   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   PREPARATION:  After appropriate preanesthesia, the patient was brought to  the operating room, placed on the operating table in the dorsal supine  position where general endotracheal anesthesia was introduced. He was then  replaced in dorsal lithotomy position where the pubis was prepped with  Betadine solution and draped in the usual fashion.   REVIEW OF HISTORY:  This 70 year old male was originally seen Dec 08, 2001  for inhospital evaluation. The patient had a remote history of gross  hematuria, and a history of PSA elevation of 12.1. He was started on Flomax  for bladder outlet symptoms, and has done well with that. The patient had  abdominal pelvic CT, with the findings of three urinary bladder diverticula  with a thick urinary bladder, enlarged prostate, bilateral hydronephrosis  right greater than left, with bladder outlet obstruction versus compression  of  the distal ureters by the bladder diverticula as the cause of its  problems. 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 He is now for cystourethroscopy bilateral retrograde  pyelogram, possible bladder biopsy and prostate biopsy.   Review of the CT shows that the CT was accomplished with and without  contrast. Hydronephrosis was noted bilaterally, left greater than right. The  left renal pelvis was significantly distended and the left ureter was  dilated more so than the right. There was no adenopathy seen.   DESCRIPTION OF PROCEDURE:  Cystourethroscopy was accomplished, and showed  the patient had multiple massive bladder trabeculations, cellule formation  and bladder diverticula formation. The left ureter was identified, and  retrograde pyelogram was performed which showed a large tortuous ureter, but  a good jet of contrast went into the left kidney, which showed a very full  and distended left renal pelvis, with blown out calices. The right side was  identified, but retrograde pyelogram was only performed in the lower portion  of the ureter because contrast would not go up the upper portion of the  ureter. There was no evidence  of obstruction, the lower portion of the  ureter appeared normal. The patient had such massive diverticular formation,  that it was impossible to get the catheter around the diverticulum. The  diverticula were scoped, and there was no evidence of bladder tumor with any  of the diverticula, but there was a bladder tumor right beside the opening  of the right ureter, and this was cold cup excisionally biopsied. A catheter  was placed because there was bleeding just from hydrodistention and this  will be taken out in the recovery room.   Transrectal ultrasound was accomplished, and shows a smooth prostate,  measuring approximately 50 cc, with no evidence of hypoechoic areas within  the transitional or peripheral zones.  The rectal wall and seminal vesicles  appeared normal. Transrectal needle biopsy of the prostate was accomplished,  and following this, the patient was awakened and taken to the recovery room  in good condition.                                               Sigmund I. Patsi Sears, M.D.    SIT/MEDQ  D:  03/09/2002  T:  03/10/2002  Job:  [id number]   cc:   Barbette Hair. Merril Abbe, M.D.

## 2010-12-05 NOTE — Consult Note (Signed)
[facility]  Patient:    [name], [name] Visit Number: [id number] MRN: [id number]          Service Type: MED Location: 3W 0349 01 Attending Physician:  Armanda Magic Dictated by:   Vonzell Schlatter. Patsi Sears, M.D. Proc. Date: 12/08/01 Admit Date:  12/07/2001   CC:         Rosanne Sack, M.D.   Consultation Report  HISTORY OF PRESENT ILLNESS:  This is a 70 year old retired white married male, who was admitted to the hospital after falling at home, with nosebleed, forehead abrasions and lacerations, and found to have significant ventricular tachycardia in the emergency room.  The patient complained of dizziness and had pre-syncopal episode.  He has since been admitted to the hospital, been transfused, had GI evaluation with a diagnosis of upper GI bleed.  PAST MEDICAL HISTORY: 1. Hypertension. 2. Inguinal hernia x2.  SOCIAL HISTORY:  The patient is retired with two children.  One is to be married in three weeks.  He is currently widowed.  ALCOHOL:  Three and a half glasses of wine per day (up to one to two bottles per day x3-4 years).  FAMILY HISTORY:  Noncontributory.  CURRENT MEDICATIONS: 1. Aspirin 81 mg a day. 2. Multivitamins. 3. Lycopene. 4. Magnesium. 5. Potassium. 6. Saw palmetto.  REVIEW OF SYSTEMS:  UROLOGIC:  Significant for remote history of gross hematuria x1, with no evaluation.  The patient denies bladder outlet obstructive symptoms.  PHYSICAL EXAMINATION:  RECTAL:  Normal sphincter tone.  Prostate is 3+ lobular benign.  There are no nodules.  There is no mass, no blood.  IMPRESSION:  The patient has an elevated PSA of 12.1 as done on screening examination.  He also has a history of gross hematuria which was never evaluated.  However, the patients daughter is being married in three weeks, and his cardiac situation appears to be the most pressing issue, along with this possible alcohol withdrawal.  Therefore, I have  discussed with the patient and his family that we would forego complete urologic evaluation until after his daughters wedding.  I will have him return to the office after his daughters wedding for repeat PSA, as well as CT and flexible office cystoscopy.  In this way, the patient will be able to obtain treatment for possible alcohol withdrawal, as well as his tachyarrhythmia. Dictated by:   Vonzell Schlatter Patsi Sears, M.D. Attending Physician:  Armanda Magic DD:  12/08/01 TD:  12/12/01 Job: [id number]

## 2011-06-05 ENCOUNTER — Encounter: Payer: Self-pay | Admitting: *Deleted

## 2011-06-08 ENCOUNTER — Ambulatory Visit (INDEPENDENT_AMBULATORY_CARE_PROVIDER_SITE_OTHER): Payer: Medicare Other | Admitting: Internal Medicine

## 2011-06-08 ENCOUNTER — Encounter: Payer: Self-pay | Admitting: Internal Medicine

## 2011-06-08 DIAGNOSIS — I10 Essential (primary) hypertension: Secondary | ICD-10-CM

## 2011-06-08 DIAGNOSIS — I4891 Unspecified atrial fibrillation: Secondary | ICD-10-CM

## 2011-06-08 DIAGNOSIS — E785 Hyperlipidemia, unspecified: Secondary | ICD-10-CM | POA: Insufficient documentation

## 2011-06-08 NOTE — Assessment & Plan Note (Signed)
Blood pressure is high today but he says normally is under control. He thinks his white whitecoat phenomenon

## 2011-06-08 NOTE — Patient Instructions (Signed)
We will see you back on an as needed basis.  

## 2011-06-08 NOTE — Assessment & Plan Note (Signed)
He asked about his need for Lipitor. For secondary prevention is clearly indicated.

## 2011-06-08 NOTE — Progress Notes (Signed)
  HPI  [name] is a 70 y.o. male is seen in followup for paroxysmal atrial fibrillation occurring in the setting of hypertension. He has had very few palpitation problems over the last year.  He denies chest pain.   His blood pressure is well controlled at home   Past Medical History  Diagnosis Date  . PAF (paroxysmal atrial fibrillation)   . CAD (coronary artery disease)     s/p bare metal stend to RCA  . HTN (hypertension)     Past Surgical History  Procedure Date  . Inguinal hernia repair     x2    Current Outpatient Prescriptions  Medication Sig Dispense Refill  . acetaminophen (TYLENOL) 500 MG tablet Take 500 mg by mouth every 6 (six) hours as needed.        Marland Kitchen aspirin 81 MG tablet Take 81 mg by mouth daily.        Marland Kitchen atorvastatin (LIPITOR) 20 MG tablet Take 20 mg by mouth daily.        . Calcium Carbonate-Vitamin D (CALCIUM-VITAMIN D) 500-200 MG-UNIT per tablet Take 1 tablet by mouth daily.        . cloNIDine (CATAPRES) 0.1 MG tablet Take 0.1 mg by mouth 2 (two) times daily.        . fish oil-omega-3 fatty acids 1000 MG capsule Take 2 g by mouth daily.        . folic acid (FOLVITE) 1 MG tablet Take 1 mg by mouth daily.        Marland Kitchen glucosamine-chondroitin 500-400 MG tablet Take 1 tablet by mouth daily.        . Multiple Vitamin (MULTIVITAMIN) tablet Take 1 tablet by mouth daily.        . pindolol (VISKEN) 5 MG tablet Take 5 mg by mouth 2 (two) times daily.        . Tamsulosin HCl (FLOMAX) 0.4 MG CAPS Take 0.4 mg by mouth 2 (two) times daily.          No Known Allergies  Review of Systems negative except from HPI and PMH  Physical Exam Well developed and well nourished in no acute distress HENT normal E scleral and icterus clear Neck Supple JVP flat; carotids brisk and full Clear to ausculation Regular rate and rhythm, no murmurs gallops or rub Soft with active bowel sounds No clubbing cyanosis and edema Alert and oriented, grossly normal motor and sensory  function Skin Warm and Dry  ECG Sinus rhythm at 70 year old 0.19/0.09/0.41 Axis is 63  Assessment and  Plan

## 2011-06-08 NOTE — Assessment & Plan Note (Signed)
Patient is having infrequent atrial fibrillation. Thrombo-embolic risk profile has a CHADS VASC score of 3 And I would recommend long-term anticoagulation. We discussed Pradaxa and other alternatives to Coumadin. He likes to ski and is concerned about safety. I told him that apixoban Has been demonstrated to be a safe his aspirin. I also shared with him a reservations about the utility of aspirin at all. Related to continue on his current course for now.

## 2013-05-21 ENCOUNTER — Other Ambulatory Visit: Payer: Self-pay | Admitting: *Deleted

## 2013-05-21 DIAGNOSIS — E7800 Pure hypercholesterolemia, unspecified: Secondary | ICD-10-CM

## 2013-05-21 DIAGNOSIS — Z79899 Other long term (current) drug therapy: Secondary | ICD-10-CM

## 2013-06-21 ENCOUNTER — Other Ambulatory Visit: Payer: Self-pay | Admitting: *Deleted

## 2013-06-21 ENCOUNTER — Other Ambulatory Visit: Payer: BC Managed Care – PPO

## 2013-06-21 DIAGNOSIS — E785 Hyperlipidemia, unspecified: Secondary | ICD-10-CM

## 2013-06-21 DIAGNOSIS — Z79899 Other long term (current) drug therapy: Secondary | ICD-10-CM

## 2013-06-23 ENCOUNTER — Other Ambulatory Visit: Payer: Medicare Other

## 2013-06-23 DIAGNOSIS — Z79899 Other long term (current) drug therapy: Secondary | ICD-10-CM

## 2013-06-23 DIAGNOSIS — E785 Hyperlipidemia, unspecified: Secondary | ICD-10-CM

## 2013-06-24 LAB — HEPATIC FUNCTION PANEL
AST: 28 U/L (ref 0–37)
Bilirubin, Direct: 0.1 mg/dL (ref 0.0–0.3)
Total Bilirubin: 0.4 mg/dL (ref 0.3–1.2)

## 2013-06-24 LAB — LIPID PANEL: Total CHOL/HDL Ratio: 2.3 Ratio

## 2013-06-26 ENCOUNTER — Encounter: Payer: Self-pay | Admitting: Cardiology

## 2013-06-26 ENCOUNTER — Encounter: Payer: Self-pay | Admitting: General Surgery

## 2013-06-26 ENCOUNTER — Ambulatory Visit (INDEPENDENT_AMBULATORY_CARE_PROVIDER_SITE_OTHER): Payer: Medicare Other | Admitting: Cardiology

## 2013-06-26 ENCOUNTER — Encounter (INDEPENDENT_AMBULATORY_CARE_PROVIDER_SITE_OTHER): Payer: Self-pay

## 2013-06-26 ENCOUNTER — Telehealth: Payer: Self-pay | Admitting: Cardiology

## 2013-06-26 ENCOUNTER — Ambulatory Visit: Payer: BC Managed Care – PPO | Admitting: Cardiology

## 2013-06-26 ENCOUNTER — Other Ambulatory Visit: Payer: Self-pay | Admitting: General Surgery

## 2013-06-26 VITALS — BP 148/94 | HR 59 | Ht 74.0 in | Wt 164.0 lb

## 2013-06-26 DIAGNOSIS — I10 Essential (primary) hypertension: Secondary | ICD-10-CM

## 2013-06-26 DIAGNOSIS — I4891 Unspecified atrial fibrillation: Secondary | ICD-10-CM

## 2013-06-26 DIAGNOSIS — I2510 Atherosclerotic heart disease of native coronary artery without angina pectoris: Secondary | ICD-10-CM | POA: Insufficient documentation

## 2013-06-26 DIAGNOSIS — E785 Hyperlipidemia, unspecified: Secondary | ICD-10-CM

## 2013-06-26 DIAGNOSIS — I872 Venous insufficiency (chronic) (peripheral): Secondary | ICD-10-CM | POA: Insufficient documentation

## 2013-06-26 DIAGNOSIS — Z79899 Other long term (current) drug therapy: Secondary | ICD-10-CM

## 2013-06-26 DIAGNOSIS — I472 Ventricular tachycardia: Secondary | ICD-10-CM | POA: Insufficient documentation

## 2013-06-26 NOTE — Telephone Encounter (Signed)
New message     Saw Dr Mayford Knife today---cannot get labs off of mychart.   Can you help?

## 2013-06-26 NOTE — Progress Notes (Signed)
[address] [place], [place]  [address] Phone: [phone] Fax:  [phone]  Date:  06/26/2013   ID:  [name], DOB [birth year], MRN [id number]  PCP:  Katy Apo, MD  Cardiologist:  Armanda Magic, MD     History of Present Illness: [name] is a 72 y.o. male with a history of ASCAD, HTN PAF, dyslipidemia.  He is doing well. He denies any chest pain, SOB, DOE, LE edema, dizziness, palpitations or syncope.  His BP at home runs around 130/.70mmHg.   Wt Readings from Last 3 Encounters:  06/26/13 164 lb (74.39 kg)  06/08/11 158 lb (71.668 kg)  05/27/10 165 lb 4 oz (74.957 kg)     Past Medical History  Diagnosis Date  . PAF (paroxysmal atrial fibrillation)   . CAD (coronary artery disease)     s/p bare metal stend to RCA  . HTN (hypertension)   . Ventricular tachycardia     possibly proarrhythmia from flecainide  . Chronic venous insufficiency   . Pneumothorax     Current Outpatient Prescriptions  Medication Sig Dispense Refill  . acetaminophen (TYLENOL) 500 MG tablet Take 500 mg by mouth 2 (two) times daily.       Marland Kitchen aspirin 81 MG tablet Take 81 mg by mouth daily.        Marland Kitchen atorvastatin (LIPITOR) 20 MG tablet Take 20 mg by mouth daily.        . Calcium Carbonate-Vitamin D (CALCIUM-VITAMIN D) 500-200 MG-UNIT per tablet Take 1 tablet by mouth daily.        . cloNIDine (CATAPRES) 0.1 MG tablet Take 0.1 mg by mouth 2 (two) times daily.        . fish oil-omega-3 fatty acids 1000 MG capsule Take 2 g by mouth daily.        . folic acid (FOLVITE) 1 MG tablet Take 1 mg by mouth daily.        Marland Kitchen glucosamine-chondroitin 500-400 MG tablet Take 1 tablet by mouth daily.        . Multiple Vitamin (MULTIVITAMIN) tablet Take 1 tablet by mouth daily.        . pindolol (VISKEN) 5 MG tablet Take 5 mg by mouth 2 (two) times daily.        . Tamsulosin HCl (FLOMAX) 0.4 MG CAPS Take 0.4 mg by mouth 2 (two) times daily.         No current facility-administered  medications for this visit.    Allergies:   No Known Allergies  Social History:  The patient  reports that he has never smoked. He does not have any smokeless tobacco history on file. He reports that he uses illicit drugs (Other-see comments). He reports that he does not drink alcohol.   Family History:  The patient's family history includes Coronary artery disease in an other family member.   ROS:  Please see the history of present illness.      All other systems reviewed and negative.   PHYSICAL EXAM: VS:  BP 148/94  Pulse 59  Ht 6\' 2"  (1.88 m)  Wt 164 lb (74.39 kg)  BMI 21.05 kg/m2 Well nourished, well developed, in no acute distress HEENT: normal Neck: no JVD Cardiac:  normal S1, S2; RRR; no murmur Lungs:  clear to auscultation bilaterally, no wheezing, rhonchi or rales Abd: soft, nontender, no hepatomegaly Ext: no edema Skin: warm and dry Neuro:  CNs 2-12 intact, no focal abnormalities noted  EKG:  NSR with occasional PAC's     ASSESSMENT AND PLAN:  1.  SVT with no reoccurence   - continue Pindolol 2.  ASCAD with no angina  - continue ASA 3.  HTN - well controlled  - continue pindolol/Clonidine 4.  Dyslipidemia - just checked and panel was excellent  - continue Atorvastatin   Followup with me in 1 year   Signed, Armanda Magic, MD 06/26/2013 1:39 PM

## 2013-06-26 NOTE — Telephone Encounter (Signed)
Spoke with pt and gave results verbally and explained he would have to wait a few days since he did not have mycharts yet when we got the original results.

## 2013-06-26 NOTE — Patient Instructions (Signed)
Your physician recommends that you continue on your current medications as directed. Please refer to the Current Medication list given to you today.  Your physician wants you to follow-up in: 1 Year with Dr Turner You will receive a reminder letter in the mail two months in advance. If you don't receive a letter, please call our office to schedule the follow-up appointment.  

## 2013-07-10 ENCOUNTER — Other Ambulatory Visit: Payer: Self-pay

## 2013-07-10 MED ORDER — PINDOLOL 5 MG PO TABS: 5.0000 mg | ORAL_TABLET | Freq: Two times a day (BID) | ORAL | Status: DC

## 2013-08-30 ENCOUNTER — Telehealth: Payer: Self-pay | Admitting: Cardiology

## 2013-08-30 NOTE — Telephone Encounter (Signed)
He needs to check with Dermatologist

## 2013-08-30 NOTE — Telephone Encounter (Signed)
To Danielle.  

## 2013-08-30 NOTE — Telephone Encounter (Signed)
New Problem:  Pt states he is taking 81 mg of aspirin... Pt is asking if he needs to stop taking that prior to a dermatology procedure.

## 2013-08-30 NOTE — Telephone Encounter (Signed)
To Dr. Turner, please advise.  

## 2013-08-31 ENCOUNTER — Other Ambulatory Visit: Payer: Self-pay | Admitting: Cardiology

## 2013-08-31 NOTE — Telephone Encounter (Signed)
PT made aware

## 2013-08-31 NOTE — Telephone Encounter (Signed)
LVM for pt to return call

## 2013-08-31 NOTE — Telephone Encounter (Signed)
Pts dermatologist preferred that we ask  Her since it can cause excess bleeding.   Local anesthetic per pt.

## 2013-08-31 NOTE — Telephone Encounter (Signed)
From a cardiac standpoint he is fine to hold ASA if dermatologist wants it held

## 2013-09-01 NOTE — Telephone Encounter (Signed)
Follow up    Pt did not understand message from [facility] yesterday.  Pt want to make sure Dr Radford Pax said it was ok to hold aspirin

## 2013-09-01 NOTE — Telephone Encounter (Signed)
Left message for pt OK to hold ASA per Dr Radford Pax.  Requested pt call back with further questions.

## 2013-11-28 ENCOUNTER — Other Ambulatory Visit: Payer: Self-pay | Admitting: Cardiology

## 2013-12-25 ENCOUNTER — Other Ambulatory Visit: Payer: BC Managed Care – PPO

## 2014-02-25 ENCOUNTER — Other Ambulatory Visit: Payer: Self-pay | Admitting: Cardiology

## 2014-03-16 ENCOUNTER — Other Ambulatory Visit: Payer: Self-pay | Admitting: Cardiology

## 2014-05-22 ENCOUNTER — Other Ambulatory Visit: Payer: Self-pay | Admitting: Dermatology

## 2014-05-24 ENCOUNTER — Other Ambulatory Visit: Payer: Self-pay | Admitting: Cardiology

## 2014-06-11 ENCOUNTER — Other Ambulatory Visit: Payer: Self-pay | Admitting: Dermatology

## 2014-06-19 ENCOUNTER — Other Ambulatory Visit (INDEPENDENT_AMBULATORY_CARE_PROVIDER_SITE_OTHER): Payer: Medicare Other | Admitting: *Deleted

## 2014-06-19 DIAGNOSIS — Z79899 Other long term (current) drug therapy: Secondary | ICD-10-CM

## 2014-06-19 DIAGNOSIS — E785 Hyperlipidemia, unspecified: Secondary | ICD-10-CM

## 2014-06-19 LAB — LIPID PANEL
CHOL/HDL RATIO: 3
Cholesterol: 128 mg/dL (ref 0–200)
HDL: 47.6 mg/dL (ref >39.00)
LDL Cholesterol: 72 mg/dL (ref 0–99)
NONHDL: 80.4
Triglycerides: 43 mg/dL (ref 0.0–149.0)
VLDL: 8.6 mg/dL (ref 0.0–40.0)

## 2014-06-19 LAB — ALT: ALT: 18 U/L (ref 0–53)

## 2014-06-20 ENCOUNTER — Telehealth: Payer: Self-pay | Admitting: Cardiology

## 2014-06-20 NOTE — Telephone Encounter (Signed)
New problem ° ° °Pt returning your call. Please call pt. °

## 2014-06-20 NOTE — Telephone Encounter (Signed)
Patient informed of lab results and verbal understanding expressed.   

## 2014-06-21 ENCOUNTER — Telehealth: Payer: Self-pay | Admitting: Cardiology

## 2014-06-21 NOTE — Telephone Encounter (Signed)
New message    Patient calling the attachment will not open for him. Asking for copy to mail to him.

## 2014-06-21 NOTE — Telephone Encounter (Signed)
Left message that labs are being placed in the mail.

## 2014-06-26 ENCOUNTER — Ambulatory Visit (INDEPENDENT_AMBULATORY_CARE_PROVIDER_SITE_OTHER): Payer: Medicare Other | Admitting: Cardiology

## 2014-06-26 ENCOUNTER — Encounter: Payer: Self-pay | Admitting: Cardiology

## 2014-06-26 VITALS — BP 158/82 | HR 59 | Ht 74.0 in | Wt 158.0 lb

## 2014-06-26 DIAGNOSIS — I10 Essential (primary) hypertension: Secondary | ICD-10-CM

## 2014-06-26 DIAGNOSIS — I2583 Coronary atherosclerosis due to lipid rich plaque: Secondary | ICD-10-CM

## 2014-06-26 DIAGNOSIS — I2510 Atherosclerotic heart disease of native coronary artery without angina pectoris: Secondary | ICD-10-CM

## 2014-06-26 DIAGNOSIS — E785 Hyperlipidemia, unspecified: Secondary | ICD-10-CM

## 2014-06-26 DIAGNOSIS — I480 Paroxysmal atrial fibrillation: Secondary | ICD-10-CM

## 2014-06-26 DIAGNOSIS — I872 Venous insufficiency (chronic) (peripheral): Secondary | ICD-10-CM

## 2014-06-26 NOTE — Patient Instructions (Signed)
Your physician wants you to follow-up in: 1 year with Dr. Turner. You will receive a reminder letter in the mail two months in advance. If you don't receive a letter, please call our office to schedule the follow-up appointment.  

## 2014-06-26 NOTE — Progress Notes (Signed)
[place], [place], [place]  [address] Phone: [phone] Fax:  [phone]  Date:  06/26/2014   ID:  [name], DOB [birth year], MRN [id number]  PCP:  Kandice Hams, MD  Cardiologist:  Fransico Him, MD    History of Present Illness: [name] is a 73 y.o. male with a history of ASCAD, HTN PAF, dyslipidemia. He is doing well. He denies any chest pain, SOB, DOE, LE edema, dizziness, palpitations or syncope. His BP at home runs around 126/86 -135/50mmHg.    Wt Readings from Last 3 Encounters:  06/26/14 158 lb (71.668 kg)  06/26/13 164 lb (74.39 kg)  06/08/11 158 lb (71.668 kg)     Past Medical History  Diagnosis Date  . PAF (paroxysmal atrial fibrillation)     Not on long term anticoagluation despite CHADS2VASC score of 4 due to patient refusal because he likes to ski and does not want to risk a fall with bleeding complications.  He understands his cardioembolic risk associated with PAF.   Marland Kitchen CAD (coronary artery disease)     s/p bare metal stend to RCA  . HTN (hypertension)   . Ventricular tachycardia     possibly proarrhythmia from flecainide  . Chronic venous insufficiency   . Pneumothorax     Current Outpatient Prescriptions  Medication Sig Dispense Refill  . acetaminophen (TYLENOL) 500 MG tablet Take 500 mg by mouth 2 (two) times daily.     Marland Kitchen aspirin 81 MG tablet Take 81 mg by mouth daily.      Marland Kitchen atorvastatin (LIPITOR) 20 MG tablet TAKE 1 TABLET BY MOUTH DAILY 90 tablet 0  . Cholecalciferol (D-3-5) 5000 UNITS capsule Take 5,000 Units by mouth daily.    . cloNIDine (CATAPRES) 0.1 MG tablet TAKE 1 TABLET BY MOUTH TWICE DAILY 180 tablet 0  . Cyanocobalamin (B-12) 2500 MCG TABS Take by mouth daily.    . fish oil-omega-3 fatty acids 1000 MG capsule Take 2 g by mouth daily.      . folic acid (FOLVITE) 1 MG tablet Take 1 mg by mouth daily.      Marland Kitchen glucosamine-chondroitin 500-400 MG tablet Take 1 tablet by mouth daily.      . Multiple Vitamin  (MULTIVITAMIN) tablet Take 1 tablet by mouth daily.      . pindolol (VISKEN) 5 MG tablet Take 1 tablet (5 mg total) by mouth 2 (two) times daily. 180 tablet 3  . Tamsulosin HCl (FLOMAX) 0.4 MG CAPS Take 0.4 mg by mouth 2 (two) times daily.       No current facility-administered medications for this visit.    Allergies:   No Known Allergies  Social History:  The patient  reports that he has never smoked. He does not have any smokeless tobacco history on file. He reports that he uses illicit drugs (Other-see comments). He reports that he does not drink alcohol.   Family History:  The patient's family history includes Coronary artery disease in an other family member.   ROS:  Please see the history of present illness.      All other systems reviewed and negative.   PHYSICAL EXAM: VS:  BP 158/82 mmHg  Pulse 59  Ht 6\' 2"  (1.88 m)  Wt 158 lb (71.668 kg)  BMI 20.28 kg/m2 Well nourished, well developed, in no acute distress HEENT: normal Neck: no JVD Cardiac:  normal S1, S2; RRR; no murmur Lungs:  clear to auscultation bilaterally, no wheezing, rhonchi or rales  Abd: soft, nontender, no hepatomegaly Ext: no edema Skin: warm and dry Neuro:  CNs 2-12 intact, no focal abnormalities noted  EKG:     NSR with sinus arrhythmia and no ST changes  ASSESSMENT AND PLAN:  1. SVT/PAF with no reoccurence - continue Pindolol 2. ASCAD with no angina - continue ASA 3. HTN - well controlled - continue pindolol/Clonidine 4. Dyslipidemia - just checked and LDL at goal - continue Atorvastatin   Followup with me in 1 year   Signed, Fransico Him, MD [facility] 06/26/2014 2:38 PM

## 2014-06-27 ENCOUNTER — Telehealth: Payer: Self-pay | Admitting: Cardiology

## 2014-06-27 NOTE — Telephone Encounter (Signed)
New message     Need to give an update to Dr Radford Pax.  He saw her yesterday.  Please call

## 2014-06-27 NOTE — Telephone Encounter (Signed)
Spoke with patient about Xarelto and Eliquis. Patient st he is thinking about whether he wants to try one basked on his conversation with Dr. Radford Pax yesterday.  Instructed patient to think about it and call back when he makes a decision.

## 2014-07-17 ENCOUNTER — Telehealth: Payer: Self-pay | Admitting: Cardiology

## 2014-07-17 NOTE — Telephone Encounter (Signed)
New message      Pt saw Dr Radford Pax last week.  They talked about taking a blood thinner.  Daughter want to talk to Dr Marca Ancona has questions

## 2014-07-18 NOTE — Telephone Encounter (Signed)
[name] inquiring about blood thinner for her father. She st that pt told her Dr. Radford Pax mentioned starting one at the last office visit.  She asked about EKG and was explained that it was sinus arrhythmia.  Explained to her that if patient were to start a blood thinner, it would be for prevention.  She is thankful for the information.

## 2014-08-20 ENCOUNTER — Other Ambulatory Visit: Payer: Self-pay | Admitting: Cardiology

## 2014-08-21 ENCOUNTER — Other Ambulatory Visit: Payer: Self-pay | Admitting: Cardiology

## 2014-11-13 ENCOUNTER — Telehealth: Payer: Self-pay | Admitting: Cardiology

## 2014-11-13 NOTE — Telephone Encounter (Signed)
New message     Pt calling stating that he is considering taking a supplement called Turmeric/Curcumin 500 mg pt wants to take 1,000 mg per day. Pt is wanting to find out if Dr. Radford Pax thinks this will be ok or if it would have a negative reaction with his current meds. Please call back and advise.

## 2014-11-13 NOTE — Telephone Encounter (Signed)
Please forward this to pharmacy

## 2014-11-14 NOTE — Telephone Encounter (Signed)
Turmeric may increase bleeding risk.  He should watch for any signs of bleeding or increased bruising as he is already taking aspirin and fish oil.

## 2014-11-15 NOTE — Telephone Encounter (Signed)
F/u ° ° °Pt returning your call °

## 2014-11-15 NOTE — Telephone Encounter (Signed)
Left message to call back  

## 2014-11-15 NOTE — Telephone Encounter (Signed)
Informed patient that Turmeric may increase his bleeding risk. Instructed him to watch for signs of bleeding, increased bruising, or petechiae.  Patient agrees with treatment plan.

## 2014-12-14 ENCOUNTER — Telehealth: Payer: Self-pay | Admitting: Cardiology

## 2014-12-14 NOTE — Telephone Encounter (Signed)
Informed patient that CAD is listed in his chart and he is taking medications to treat it. Patient annoyed because his insurance is more expensive because of the diagnosis, but appreciates the call back. Instructed patient to call if he has any other questions or concerns.

## 2014-12-14 NOTE — Telephone Encounter (Signed)
New Prob   Pt has some questions regarding his current diagnosis. Calling to verify he is being treated for coronary artery disease. Please call.

## 2015-04-03 ENCOUNTER — Encounter: Payer: Self-pay | Admitting: Cardiology

## 2015-05-11 ENCOUNTER — Other Ambulatory Visit: Payer: Self-pay | Admitting: Cardiology

## 2015-05-28 ENCOUNTER — Telehealth: Payer: Self-pay | Admitting: Cardiology

## 2015-05-28 DIAGNOSIS — E785 Hyperlipidemia, unspecified: Secondary | ICD-10-CM

## 2015-05-28 NOTE — Telephone Encounter (Signed)
New Message  Pt calling about lab work for Dec appt. Pt was made appt for Dec 2 for labs, but no order in syst.

## 2015-05-29 NOTE — Telephone Encounter (Signed)
Left message for patient to come fasting to lab appointment December 2. Labs ordered. Instructed patient to call back with further questions or concerns.

## 2015-05-29 NOTE — Telephone Encounter (Signed)
FLP and ALT 

## 2015-06-21 ENCOUNTER — Other Ambulatory Visit (INDEPENDENT_AMBULATORY_CARE_PROVIDER_SITE_OTHER): Payer: Medicare Other | Admitting: *Deleted

## 2015-06-21 ENCOUNTER — Telehealth: Payer: Self-pay | Admitting: Cardiology

## 2015-06-21 DIAGNOSIS — E785 Hyperlipidemia, unspecified: Secondary | ICD-10-CM

## 2015-06-21 LAB — HEPATIC FUNCTION PANEL
ALK PHOS: 47 U/L (ref 40–115)
ALT: 16 U/L (ref 9–46)
AST: 21 U/L (ref 10–35)
Albumin: 3.5 g/dL — ABNORMAL LOW (ref 3.6–5.1)
BILIRUBIN DIRECT: 0.1 mg/dL (ref <=0.2)
BILIRUBIN TOTAL: 0.6 mg/dL (ref 0.2–1.2)
Indirect Bilirubin: 0.5 mg/dL (ref 0.2–1.2)
Total Protein: 5.8 g/dL — ABNORMAL LOW (ref 6.1–8.1)

## 2015-06-21 LAB — LIPID PANEL
CHOL/HDL RATIO: 2.2 ratio (ref <=5.0)
CHOLESTEROL: 108 mg/dL — AB (ref 125–200)
HDL: 50 mg/dL (ref >=40)
LDL Cholesterol: 52 mg/dL (ref <130)
Triglycerides: 31 mg/dL (ref <150)
VLDL: 6 mg/dL (ref <30)

## 2015-06-21 NOTE — Telephone Encounter (Signed)
Pt states that he has an appt with Dr. Radford Pax on 12/8 and he would like to get a copy of his lab results while he is here that day. Advised pt that I will send message to Valetta Fuller to make her aware so she could give those to him on Thursday. Pt verbalized understanding and was appreciative for call.

## 2015-06-21 NOTE — Telephone Encounter (Signed)
New problem    Pt need copy of his lab report.

## 2015-06-25 NOTE — Telephone Encounter (Signed)
Labs printed to be given to patient 12/8.

## 2015-06-27 ENCOUNTER — Encounter: Payer: Self-pay | Admitting: Cardiology

## 2015-06-27 ENCOUNTER — Encounter: Payer: Medicare Other | Admitting: Cardiology

## 2015-06-28 NOTE — Progress Notes (Signed)
This encounter was created in error - please disregard.

## 2015-06-30 NOTE — Progress Notes (Signed)
Cardiology Office Note   Date:  07/01/2015   ID:  Santanna, Scarce 12-Oct-1940, MRN [id number]  PCP:  Kandice Hams, MD    No chief complaint on file.     History of Present Illness: TRAI NASH is a 74 y.o. male with a history of ASCAD, HTN PAF, dyslipidemia. He is doing well. He denies any chest pain, SOB, DOE, LE edema, dizziness, palpitations or syncope.   Past Medical History  Diagnosis Date  . PAF (paroxysmal atrial fibrillation) (HCC)     Not on long term anticoagluation despite CHADS2VASC score of 4 due to patient refusal because he likes to ski and does not want to risk a fall with bleeding complications.  He understands his cardioembolic risk associated with PAF.   Marland Kitchen CAD (coronary artery disease)     s/p bare metal stend to RCA  . HTN (hypertension)   . Ventricular tachycardia (HCC)     possibly proarrhythmia from flecainide  . Chronic venous insufficiency   . Pneumothorax     Past Surgical History  Procedure Laterality Date  . Inguinal hernia repair      x2     Current Outpatient Prescriptions  Medication Sig Dispense Refill  . aspirin 81 MG tablet Take 81 mg by mouth daily.      Marland Kitchen atorvastatin (LIPITOR) 20 MG tablet TAKE 1 TABLET BY MOUTH DAILY 90 tablet 0  . Cholecalciferol (D-3-5) 5000 UNITS capsule Take 5,000 Units by mouth daily.    . cloNIDine (CATAPRES) 0.1 MG tablet TAKE 1 TABLET BY MOUTH TWICE DAILY 180 tablet 0  . Coenzyme Q10 (CO Q 10 PO) Take 1 tablet by mouth daily.    . Cyanocobalamin (B-12) 2500 MCG TABS Take by mouth daily.    . fish oil-omega-3 fatty acids 1000 MG capsule Take 2 g by mouth daily.      Marland Kitchen glucosamine-chondroitin 500-400 MG tablet Take 1 tablet by mouth daily.      . Multiple Vitamin (MULTIVITAMIN) tablet Take 1 tablet by mouth daily.      . pindolol (VISKEN) 5 MG tablet TAKE 1 TABLET BY MOUTH TWICE DAILY 180 tablet 0  . Tamsulosin HCl (FLOMAX) 0.4 MG CAPS Take 0.4 mg by mouth 2 (two) times  daily.       No current facility-administered medications for this visit.    Allergies:   Review of patient's allergies indicates no known allergies.    Social History:  The patient  reports that he has never smoked. He does not have any smokeless tobacco history on file. He reports that he uses illicit drugs (Other-see comments). He reports that he does not drink alcohol.   Family History:  The patient's family history includes Coronary artery disease in an other family member.    ROS:  Please see the history of present illness.   Otherwise, review of systems are positive for none.   Al other systems are reviewed and negative.    PHYSICAL EXAM: VS:  BP 136/88 mmHg  Pulse 59  Ht 6\' 2"  (1.88 m)  Wt 157 lb (71.215 kg)  BMI 20.15 kg/m2 , BMI Body mass index is 20.15 kg/(m^2). GEN: Well nourished, well developed, in no acute distress HEENT: normal Neck: no JVD, carotid bruits, or masses Cardiac: RRR; no murmurs, rubs, or gallops,no edema  Respiratory:  clear to auscultation bilaterally, normal work of breathing GI: soft, nontender,  nondistended, + BS MS: no deformity or atrophy Skin: warm and dry, no rash Neuro:  Strength and sensation are intact Psych: euthymic mood, full affect   EKG:  EKG is ordered today. The ekg ordered today demonstrates NSR with no ST change   Recent Labs: 06/21/2015: ALT 16    Lipid Panel    Component Value Date/Time   CHOL 108* 06/21/2015 0755   TRIG 31 06/21/2015 0755   HDL 50 06/21/2015 0755   CHOLHDL 2.2 06/21/2015 0755   VLDL 6 06/21/2015 0755   LDLCALC 52 06/21/2015 0755      Wt Readings from Last 3 Encounters:  07/01/15 157 lb (71.215 kg)  06/26/14 158 lb (71.668 kg)  06/26/13 164 lb (74.39 kg)        ASSESSMENT AND PLAN:  1. SVT/PAF with no reoccurence - continue Pindolol 2. ASCAD with no angina - continue ASA 3. HTN - well controlled - continue pindolol/Clonidine 4.  Dyslipidemia - just checked and LDL at goal (52) - continue Atorvastatin  Current medicines are reviewed at length with the patient today.  The patient does not have concerns regarding medicines.  The following changes have been made:  no change  Labs/ tests ordered today: See above Assessment and Plan No orders of the defined types were placed in this encounter.     Disposition:   FU with me in 1 year  Signed, Sueanne Margarita, MD  07/01/2015 10:02 AM    [facility] [place], [place], [place]  [address] Phone: [phone]; Fax: [phone]

## 2015-07-01 ENCOUNTER — Ambulatory Visit (INDEPENDENT_AMBULATORY_CARE_PROVIDER_SITE_OTHER): Payer: Medicare Other | Admitting: Cardiology

## 2015-07-01 ENCOUNTER — Encounter: Payer: Self-pay | Admitting: Cardiology

## 2015-07-01 VITALS — BP 136/88 | HR 59 | Ht 74.0 in | Wt 157.0 lb

## 2015-07-01 DIAGNOSIS — I480 Paroxysmal atrial fibrillation: Secondary | ICD-10-CM

## 2015-07-01 DIAGNOSIS — I2510 Atherosclerotic heart disease of native coronary artery without angina pectoris: Secondary | ICD-10-CM | POA: Diagnosis not present

## 2015-07-01 DIAGNOSIS — I2583 Coronary atherosclerosis due to lipid rich plaque: Secondary | ICD-10-CM

## 2015-07-01 DIAGNOSIS — I10 Essential (primary) hypertension: Secondary | ICD-10-CM | POA: Diagnosis not present

## 2015-07-01 DIAGNOSIS — E785 Hyperlipidemia, unspecified: Secondary | ICD-10-CM | POA: Diagnosis not present

## 2015-07-01 NOTE — Patient Instructions (Signed)
Medication Instructions:  Your physician recommends that you continue on your current medications as directed. Please refer to the Current Medication list given to you today.   Labwork: None Ordered   Testing/Procedures: None Ordered   Follow-Up: Your physician wants you to follow-up in: 1 year with Dr. Acie Fredrickson.  You will receive a reminder letter in the mail two months in advance. If you don't receive a letter, please call our office to schedule the follow-up appointment.   If you need a refill on your cardiac medications before your next appointment, please call your pharmacy.

## 2015-07-23 ENCOUNTER — Telehealth: Payer: Self-pay | Admitting: Cardiology

## 2015-07-23 NOTE — Telephone Encounter (Signed)
This should not interfere with any of his current cardiac medications.

## 2015-07-23 NOTE — Telephone Encounter (Signed)
Patient would like to start taking turmeric 500 mg daily for joint health and would like to know if Dr. Radford Pax thinks it's OK before starting.  To Dr. Radford Pax and Gay Filler, [facility].

## 2015-07-23 NOTE — Telephone Encounter (Signed)
Left message for patient that it is fine for him to start new supplement. Instructed patient to call if he has any further questions or concerns.

## 2015-07-23 NOTE — Telephone Encounter (Signed)
New message    Patient calling    Pt C/O medication issue:  1. Name of Medication: supplement turmeric / curcumin   2. How are you currently taking this medication (dosage and times per day)? Has not started taken medication    3. Are you having a reaction (difficulty breathing--STAT)? Has not started taken medication    4. What is your medication issue?  Would like to start taken this medication / or any interaction .

## 2015-07-24 NOTE — Telephone Encounter (Signed)
Ok to take tumeric  

## 2015-08-10 ENCOUNTER — Other Ambulatory Visit: Payer: Self-pay | Admitting: Cardiology

## 2015-08-12 ENCOUNTER — Telehealth: Payer: Self-pay | Admitting: Cardiology

## 2015-08-12 NOTE — Telephone Encounter (Signed)
New message      Calling to let nurse know that [facility] may call to refill pindolol.  He did not want it refilled, he was inquiring on the price.  If they call-----do not refill it at this time

## 2015-08-13 NOTE — Telephone Encounter (Signed)
Noted. To Refill team as FYI.

## 2015-08-15 ENCOUNTER — Other Ambulatory Visit: Payer: Self-pay | Admitting: Cardiology

## 2015-08-15 MED ORDER — PINDOLOL 5 MG PO TABS: 5.0000 mg | ORAL_TABLET | Freq: Two times a day (BID) | ORAL | Status: DC

## 2015-08-15 MED ORDER — CLONIDINE HCL 0.1 MG PO TABS: 0.1000 mg | ORAL_TABLET | Freq: Two times a day (BID) | ORAL | Status: DC

## 2015-08-15 MED ORDER — ATORVASTATIN CALCIUM 20 MG PO TABS: 20.0000 mg | ORAL_TABLET | Freq: Every day | ORAL | Status: DC

## 2015-08-15 NOTE — Telephone Encounter (Signed)
Pt changed pharmacy and requested Rx be resent. Confirmation received.

## 2015-09-30 ENCOUNTER — Telehealth: Payer: Self-pay | Admitting: Cardiology

## 2015-09-30 NOTE — Telephone Encounter (Signed)
Nitro not listed on patients current or historical med list.

## 2015-09-30 NOTE — Telephone Encounter (Signed)
°*  STAT* If patient is at the pharmacy, call can be transferred to refill team.   1. Which medications need to be refilled? (please list name of each medication and dose if known) Nitro Stat 0.4 mg-new prescription  2. Which pharmacy/location (including street and city if local pharmacy) is medication to be sent to? [facility] [phone]  3. Do they need a 30 day or 90 day supply? 25

## 2015-10-02 ENCOUNTER — Other Ambulatory Visit: Payer: Self-pay | Admitting: *Deleted

## 2015-10-02 MED ORDER — NITROGLYCERIN 0.4 MG SL SUBL: 0.4000 mg | SUBLINGUAL_TABLET | SUBLINGUAL | Status: DC | PRN

## 2015-10-02 NOTE — Telephone Encounter (Signed)
Yes he needs this refilled

## 2015-10-02 NOTE — Telephone Encounter (Signed)
Patient called and stated that the nitro was prescribed for him by Dr Radford Pax ten years ago or so. He questioned if Dr Radford Pax still wanted him to carry this medication. He has not had to use it, but he will be going out of town on Tuesday and wanted a new bottle just in case he needs it.

## 2015-10-02 NOTE — Telephone Encounter (Signed)
Rx sent in, patient aware  

## 2016-03-18 ENCOUNTER — Telehealth: Payer: Self-pay | Admitting: Cardiology

## 2016-03-18 NOTE — Telephone Encounter (Signed)
New message       Can pt take tylenol (500mg ---1 tablet) for aches and pains?

## 2016-03-18 NOTE — Telephone Encounter (Signed)
Informed patient it is fine for him to take Tylenol 500 mg PRN for minor aches and pains. He understands to take seldomly for kidney safety. He was grateful for call.

## 2016-04-23 ENCOUNTER — Telehealth: Payer: Self-pay | Admitting: Cardiology

## 2016-04-23 DIAGNOSIS — E785 Hyperlipidemia, unspecified: Secondary | ICD-10-CM

## 2016-04-23 NOTE — Telephone Encounter (Signed)
New Message  Pt voiced he wants someone to contact him about his labs prior to his appt on 06/23/2016.  No orders listed for labs.  Please f/u

## 2016-05-01 NOTE — Telephone Encounter (Signed)
Follow Up:     Pt still waiting to find out if he needs lab work?

## 2016-05-01 NOTE — Telephone Encounter (Signed)
FLP and ALT - he can come fasting to his appt if he wants

## 2016-05-01 NOTE — Telephone Encounter (Signed)
Left message for patient that he will be called next week to let him know if Dr. Radford Pax would like lab work prior to appointment in December.

## 2016-05-04 NOTE — Telephone Encounter (Signed)
Patient to come for fasting lab work Friday, December 1. He was grateful for call.

## 2016-06-09 ENCOUNTER — Telehealth: Payer: Self-pay | Admitting: Cardiology

## 2016-06-09 NOTE — Telephone Encounter (Signed)
Attempted to call patient. After several rings, hung up as no VM came on to leave message.  Will try again later.

## 2016-06-09 NOTE — Telephone Encounter (Signed)
New Message  Pt call requesting to speak with RN about taking the supplement SAM-E. Please call back to discuss

## 2016-06-15 NOTE — Telephone Encounter (Signed)
Patient would like permission from Cardiology to start the supplement SAM-e (A-adenosyl-methionine) for liver and joint health.

## 2016-06-15 NOTE — Telephone Encounter (Signed)
F/u Message ° °Pt returning RN call. Please call back to discuss  °

## 2016-06-16 ENCOUNTER — Encounter: Payer: Self-pay | Admitting: Cardiology

## 2016-06-16 NOTE — Telephone Encounter (Signed)
[place] for pt to take SAM-e. Cautioned him that if he is started on any medications that affect serotonin (SSRI, tramadol, MAOI) etc that he should stop taking supplementation (potential to cause serotonin syndrome). Pt verbalized understanding.

## 2016-06-19 ENCOUNTER — Other Ambulatory Visit: Payer: Medicare Other | Admitting: *Deleted

## 2016-06-19 DIAGNOSIS — E785 Hyperlipidemia, unspecified: Secondary | ICD-10-CM

## 2016-06-19 LAB — HEPATIC FUNCTION PANEL
ALBUMIN: 3.6 g/dL (ref 3.6–5.1)
ALK PHOS: 56 U/L (ref 40–115)
ALT: 16 U/L (ref 9–46)
AST: 20 U/L (ref 10–35)
Bilirubin, Direct: 0.1 mg/dL (ref <=0.2)
Indirect Bilirubin: 0.4 mg/dL (ref 0.2–1.2)
TOTAL PROTEIN: 5.2 g/dL — AB (ref 6.1–8.1)
Total Bilirubin: 0.5 mg/dL (ref 0.2–1.2)

## 2016-06-19 LAB — LIPID PANEL
CHOL/HDL RATIO: 1.9 ratio (ref <5.0)
CHOLESTEROL: 107 mg/dL (ref <200)
HDL: 56 mg/dL (ref >40)
LDL Cholesterol: 44 mg/dL (ref <100)
TRIGLYCERIDES: 34 mg/dL (ref <150)
VLDL: 7 mg/dL (ref <30)

## 2016-06-22 NOTE — Progress Notes (Signed)
Cardiology Office Note    Date:  06/23/2016   ID:  Jesse, Reyes [birth year], MRN [id number]  PCP:  Kandice Hams, MD  Cardiologist:  Fransico Him, MD   Chief Complaint  Patient presents with  . Coronary Artery Disease  . Hypertension  . Atrial Fibrillation  . Hyperlipidemia    History of Present Illness:  [name] is a 75 y.o. male with a history of ASCAD, HTN PAF, dyslipidemia. He is doing well. He denies any chest pain, SOB, DOE,  dizziness, palpitations or syncope.He is working 5 days weekly with no problems. He occasionally has some LE edema which is controlled on compression hose.  mn Past Medical History:  Diagnosis Date  . CAD (coronary artery disease)    s/p bare metal stend to RCA  . Chronic venous insufficiency   . HTN (hypertension)   . PAF (paroxysmal atrial fibrillation) (HCC)    Not on long term anticoagluation despite CHADS2VASC score of 4 due to patient refusal because he likes to ski and does not want to risk a fall with bleeding complications.  He understands his cardioembolic risk associated with PAF.   Marland Kitchen Pneumothorax   . Ventricular tachycardia (HCC)    possibly proarrhythmia from flecainide    Past Surgical History:  Procedure Laterality Date  . INGUINAL HERNIA REPAIR     x2    Current Medications: Outpatient Medications Prior to Visit  Medication Sig Dispense Refill  . aspirin 81 MG tablet Take 81 mg by mouth daily.      Marland Kitchen atorvastatin (LIPITOR) 20 MG tablet Take 1 tablet (20 mg total) by mouth daily. 90 tablet 3  . Cholecalciferol (D-3-5) 5000 UNITS capsule Take 5,000 Units by mouth daily.    . cloNIDine (CATAPRES) 0.1 MG tablet Take 1 tablet (0.1 mg total) by mouth 2 (two) times daily. 180 tablet 3  . Coenzyme Q10 (CO Q 10 PO) Take 1 tablet by mouth daily.    . Cyanocobalamin (B-12) 2500 MCG TABS Take by mouth daily.    . fish oil-omega-3 fatty acids 1000 MG capsule Take 2 g by mouth daily.      Marland Kitchen  glucosamine-chondroitin 500-400 MG tablet Take 1 tablet by mouth daily.      . Multiple Vitamin (MULTIVITAMIN) tablet Take 1 tablet by mouth daily.      . nitroGLYCERIN (NITROSTAT) 0.4 MG SL tablet Place 1 tablet (0.4 mg total) under the tongue every 5 (five) minutes as needed for chest pain. 25 tablet 5  . pindolol (VISKEN) 5 MG tablet Take 1 tablet (5 mg total) by mouth 2 (two) times daily. 180 tablet 3  . Tamsulosin HCl (FLOMAX) 0.4 MG CAPS Take 0.4 mg by mouth 2 (two) times daily.       No facility-administered medications prior to visit.      Allergies:   Patient has no known allergies.   Social History   Social History  . Marital status: Widowed    Spouse name: N/A  . Number of children: N/A  . Years of education: N/A   Occupational History  . retired    Social History Main Topics  . Smoking status: Never Smoker  . Smokeless tobacco: Never Used  . Alcohol use No     Comment: occsaoinal  . Drug use:     Types: Other-see comments  . Sexual activity: Not Asked   Other Topics Concern  . None   Social History Narrative  . None  Family History:  The patient's family history is not on file.   ROS:   Please see the history of present illness.    ROS All other systems reviewed and are negative.  No flowsheet data found.     PHYSICAL EXAM:   VS:  BP 134/68   Pulse 60   Ht 6\' 2"  (1.88 m)   Wt 159 lb 12.8 oz (72.5 kg)   BMI 20.52 kg/m    GEN: Well nourished, well developed, in no acute distress  HEENT: normal  Neck: no JVD, carotid bruits, or masses Cardiac: RRR; no murmurs, rubs, or gallops,no edema.  Intact distal pulses bilaterally.  Respiratory:  clear to auscultation bilaterally, normal work of breathing GI: soft, nontender, nondistended, + BS MS: no deformity or atrophy  Skin: warm and dry, no rash Neuro:  Alert and Oriented x 3, Strength and sensation are intact Psych: euthymic mood, full affect  Wt Readings from Last 3 Encounters:  06/23/16 159  lb 12.8 oz (72.5 kg)  07/01/15 157 lb (71.2 kg)  06/26/14 158 lb (71.7 kg)      Studies/Labs Reviewed:   EKG:  EKG is ordered today.  The ekg ordered today demonstrates sinus bradycardia at 54bpm with PACs with no changes  Recent Labs: 06/19/2016: ALT 16   Lipid Panel    Component Value Date/Time   CHOL 107 06/19/2016 0809   TRIG 34 06/19/2016 0809   HDL 56 06/19/2016 0809   CHOLHDL 1.9 06/19/2016 0809   VLDL 7 06/19/2016 0809   LDLCALC 44 06/19/2016 0809    Additional studies/ records that were reviewed today include:  none    ASSESSMENT:    1. Persistent atrial fibrillation ([place])   2. HYPERTENSION, BENIGN   3. Coronary artery disease involving native coronary artery of native heart without angina pectoris   4. Dyslipidemia      PLAN:  In order of problems listed above:  1. Persistent atrial fibrillation - maintaining sinus bradycardia.  Continue BB.  He had refused anticoagulation in the past due to being very active with skiing.  His CHADS2VASC score is 4. 2. HTN - BP controlled on current meds.  Continue BB 3. ASCAD s/p BMS to the RCA - he has not had any angina and remains quite active.  Continue ASA/statin and BB and Clonidine. 4. Hyperlipidemia with LDL goal < 70.  Continue statin. LDL at goal at 44 this month when checked.     Medication Adjustments/Labs and Tests Ordered: Current medicines are reviewed at length with the patient today.  Concerns regarding medicines are outlined above.  Medication changes, Labs and Tests ordered today are listed in the Patient Instructions below.  There are no Patient Instructions on file for this visit.   Signed, Fransico Him, MD  06/23/2016 8:28 AM    [address], [place], [place]  [address] Phone: [phone]; Fax: [phone]

## 2016-06-23 ENCOUNTER — Encounter: Payer: Self-pay | Admitting: Cardiology

## 2016-06-23 ENCOUNTER — Telehealth: Payer: Self-pay | Admitting: Cardiology

## 2016-06-23 ENCOUNTER — Ambulatory Visit (INDEPENDENT_AMBULATORY_CARE_PROVIDER_SITE_OTHER): Payer: Medicare Other | Admitting: Cardiology

## 2016-06-23 VITALS — BP 134/68 | HR 60 | Ht 74.0 in | Wt 159.8 lb

## 2016-06-23 DIAGNOSIS — I481 Persistent atrial fibrillation: Secondary | ICD-10-CM | POA: Diagnosis not present

## 2016-06-23 DIAGNOSIS — E785 Hyperlipidemia, unspecified: Secondary | ICD-10-CM

## 2016-06-23 DIAGNOSIS — I2510 Atherosclerotic heart disease of native coronary artery without angina pectoris: Secondary | ICD-10-CM

## 2016-06-23 DIAGNOSIS — I10 Essential (primary) hypertension: Secondary | ICD-10-CM

## 2016-06-23 DIAGNOSIS — I4819 Other persistent atrial fibrillation: Secondary | ICD-10-CM

## 2016-06-23 NOTE — Telephone Encounter (Signed)
Copy of labs printed and placed in outgoing mail box.

## 2016-06-23 NOTE — Patient Instructions (Signed)

## 2016-06-23 NOTE — Telephone Encounter (Signed)
New Message  Pt voiced he is calling to request a copy of his results, if someone would like to call, email, or send it to him in the mail it's fine with him.  Please f/u with pt

## 2016-06-25 ENCOUNTER — Telehealth: Payer: Self-pay | Admitting: Cardiology

## 2016-06-25 NOTE — Telephone Encounter (Signed)
New message      Pt was seen recently.  He forgot to ask the nurse for a copy of his lab results.  Please call and give him the numbers over the phone or send him a copy of the labs.

## 2016-06-25 NOTE — Telephone Encounter (Signed)
Informed patient a copy was placed in the mail last time he called. He was grateful for help.

## 2016-08-19 ENCOUNTER — Other Ambulatory Visit: Payer: Self-pay | Admitting: Cardiology

## 2016-08-24 ENCOUNTER — Telehealth: Payer: Self-pay | Admitting: Cardiology

## 2016-08-24 ENCOUNTER — Other Ambulatory Visit: Payer: Self-pay | Admitting: *Deleted

## 2016-08-24 MED ORDER — PINDOLOL 5 MG PO TABS: ORAL_TABLET | ORAL | 2 refills | Status: DC

## 2016-08-24 NOTE — Telephone Encounter (Signed)
New message   *STAT* If patient is at the pharmacy, call can be transferred to refill team.   1. Which medications need to be refilled? (please list name of each medication and dose if known) Pindolol  2. Which pharmacy/location (including street and city if local pharmacy) is medication to be sent to[facility]  3. Do they need a 30 day or 90 day supply? Per pt would like to change from 30 day to 90 day. Please call back to discuss

## 2016-09-07 ENCOUNTER — Telehealth: Payer: Self-pay | Admitting: Cardiology

## 2016-09-07 NOTE — Telephone Encounter (Signed)
NO ANSWER PHONE JUST RINGS WILL TRY LATER ./CY 

## 2016-09-07 NOTE — Telephone Encounter (Signed)
New message      Can pt take "type 2 collegen tablets" for joint problems along with his heart medications?  Please call

## 2016-09-08 NOTE — Telephone Encounter (Signed)
Will forward to Dr. Radford Pax to review.

## 2016-09-09 NOTE — Telephone Encounter (Signed)
Patient needs to review with pharmacy but most likely this is fine

## 2016-09-09 NOTE — Telephone Encounter (Signed)
Informed patient that collagen tablets should be Ok to take with cardiac medications. He was grateful for call.

## 2016-09-09 NOTE — Telephone Encounter (Signed)
Agree this should be ok with his cardiac medications.

## 2017-01-28 ENCOUNTER — Telehealth: Payer: Self-pay | Admitting: Cardiology

## 2017-01-28 NOTE — Telephone Encounter (Signed)
Patient calling, states that he would like to take a 500 mg tylenol as needed. Patient would like to know how long before or after the current medications he takes, can he take the 500 mg tylenol.

## 2017-01-28 NOTE — Telephone Encounter (Signed)
Left message to call back  

## 2017-01-29 NOTE — Telephone Encounter (Signed)
Mr.Sangha is returning your call. He ask that you call him back at [phone].. Thanks

## 2017-01-29 NOTE — Telephone Encounter (Signed)
Informed patient he may take Tylenol as needed. Reiterated to him not to take over 3000 mg daily to protect his liver. He was grateful for call and agrees with treatment plan.

## 2017-03-16 ENCOUNTER — Telehealth: Payer: Self-pay | Admitting: Cardiology

## 2017-03-16 DIAGNOSIS — E785 Hyperlipidemia, unspecified: Secondary | ICD-10-CM

## 2017-03-16 NOTE — Telephone Encounter (Signed)
FLp and ALT

## 2017-03-16 NOTE — Telephone Encounter (Signed)
New message    Pt states he should have blood work for his December appt. No orders to schedule.

## 2017-03-17 NOTE — Telephone Encounter (Signed)
Scheduled patient for fasting blood work 12/12. Patient agrees with treatment plan and was grateful for call.

## 2017-06-30 ENCOUNTER — Other Ambulatory Visit: Payer: Medicare Other

## 2017-07-02 ENCOUNTER — Encounter: Payer: Self-pay | Admitting: Cardiology

## 2017-07-02 ENCOUNTER — Other Ambulatory Visit: Payer: Medicare Other | Admitting: *Deleted

## 2017-07-02 ENCOUNTER — Telehealth: Payer: Self-pay

## 2017-07-02 ENCOUNTER — Ambulatory Visit (INDEPENDENT_AMBULATORY_CARE_PROVIDER_SITE_OTHER): Payer: Medicare Other | Admitting: Cardiology

## 2017-07-02 ENCOUNTER — Encounter (INDEPENDENT_AMBULATORY_CARE_PROVIDER_SITE_OTHER): Payer: Self-pay

## 2017-07-02 VITALS — BP 201/92 | HR 42 | Ht 74.0 in | Wt 158.8 lb

## 2017-07-02 DIAGNOSIS — I10 Essential (primary) hypertension: Secondary | ICD-10-CM | POA: Diagnosis not present

## 2017-07-02 DIAGNOSIS — I2510 Atherosclerotic heart disease of native coronary artery without angina pectoris: Secondary | ICD-10-CM

## 2017-07-02 DIAGNOSIS — E785 Hyperlipidemia, unspecified: Secondary | ICD-10-CM | POA: Diagnosis not present

## 2017-07-02 DIAGNOSIS — I4819 Other persistent atrial fibrillation: Secondary | ICD-10-CM

## 2017-07-02 DIAGNOSIS — I481 Persistent atrial fibrillation: Secondary | ICD-10-CM | POA: Diagnosis not present

## 2017-07-02 LAB — LIPID PANEL
CHOL/HDL RATIO: 2 ratio (ref 0.0–5.0)
CHOLESTEROL TOTAL: 117 mg/dL (ref 100–199)
HDL: 58 mg/dL (ref >39)
LDL Calculated: 51 mg/dL (ref 0–99)
TRIGLYCERIDES: 39 mg/dL (ref 0–149)
VLDL Cholesterol Cal: 8 mg/dL (ref 5–40)

## 2017-07-02 LAB — ALT: ALT: 17 IU/L (ref 0–44)

## 2017-07-02 NOTE — Telephone Encounter (Addendum)
Patient was seen in the office earlier and was Hypertensive (178/84 and 201/92 on recheck). Dr. Radford Pax wanted to order a 24 hour BP monitor. Patient was scheduled for 08/06/17 (first available). This is too long for the patient to have to wait. Discussed with Dr. Radford Pax, and she wants to cancel 24 hour BP monitor and have patient be seen in the HTN Clinic and he is to bring his BP monitor with him. Made Dr. Radford Pax aware that the first available would be the week after Christmas. Dr. Radford Pax was okay with this.   Attempted to contact patient, but there was no answer. Left message for patient to call back.

## 2017-07-02 NOTE — Telephone Encounter (Signed)
Patient returned call. Made him aware that Dr. Radford Pax would like for him to be seen in HTN Clinic and cancel the BP monitor since there is such a delay. Patient verbalized understanding. Appointment made at [facility] on 07/15/17 at 2:30 PM. Patient agrees to bring his BP monitor from home to appointment. Patient also states that he started a new herbal medicine that is called Evangeline Gula for joint pain and is not sure if this would cause his HTN. Patient advised to stop taking it for now to see if his BP improves. Also reviewed LIPID and ALT results with patient from today while he was on the phone. Patient verbalized understanding and thanked me for the call.

## 2017-07-02 NOTE — Addendum Note (Signed)
Addended by: Drue Novel I on: 07/02/2017 02:51 PM   Modules accepted: Orders

## 2017-07-02 NOTE — Progress Notes (Signed)
Cardiology Office Note:    Date:  07/02/2017   ID:  [name], DOB [birth year], MRN [id number]  PCP:  Seward Carol, MD  Cardiologist:  Fransico Him, MD   Referring MD: Seward Carol, MD   Chief Complaint  Patient presents with  . Follow-up    CAD, HTN, Afib    History of Present Illness:    [name] is a 76 y.o. male with a hx of ASCAD s/p BMS to the RCA, HTN,  PAF with CHADS2VASC score of 4 but not on long term anticoagulation de to refusal to take because he likes to ski, dyslipidemia and possible proarrhythmia from flecainide with VT.  He is here today for followup and is doing well.  He denies any chest pain or pressure, SOB, DOE, PND, orthopnea, LE edema, dizziness, palpitations or syncope. He is compliant with He meds and is tolerating meds with no SE.     Past Medical History:  Diagnosis Date  . CAD (coronary artery disease)    s/p bare metal stend to RCA  . Chronic venous insufficiency   . HTN (hypertension)   . PAF (paroxysmal atrial fibrillation) (HCC)    Not on long term anticoagluation despite CHADS2VASC score of 4 due to patient refusal because he likes to ski and does not want to risk a fall with bleeding complications.  He understands his cardioembolic risk associated with PAF.   Marland Kitchen Pneumothorax   . Ventricular tachycardia (HCC)    possibly proarrhythmia from flecainide    Past Surgical History:  Procedure Laterality Date  . INGUINAL HERNIA REPAIR     x2    Current Medications: Current Meds  Medication Sig  . Ascorbic Acid (VITAMIN C PO) Take by mouth daily.  Marland Kitchen aspirin 81 MG tablet Take 81 mg by mouth daily.    Marland Kitchen atorvastatin (LIPITOR) 20 MG tablet TAKE ONE TABLET (20 MG) BY MOUTH DAILY.  Marland Kitchen Cholecalciferol (D-3-5) 5000 UNITS capsule Take 5,000 Units by mouth daily.  . cloNIDine (CATAPRES) 0.1 MG tablet TAKE ONE TABLET (0.1 MG) BY MOUTH TWO TIMES DAILY.  Marland Kitchen Coenzyme Q10 (CO Q 10 PO) Take 1 tablet by mouth daily.  . Cyanocobalamin (B-12)  2500 MCG TABS Take by mouth daily.  . fish oil-omega-3 fatty acids 1000 MG capsule Take 2 g by mouth daily.    Marland Kitchen glucosamine-chondroitin 500-400 MG tablet Take 1 tablet by mouth daily.    . Multiple Vitamin (MULTIVITAMIN) tablet Take 1 tablet by mouth daily.    . nitroGLYCERIN (NITROSTAT) 0.4 MG SL tablet Place 1 tablet (0.4 mg total) under the tongue every 5 (five) minutes as needed for chest pain.  . pindolol (VISKEN) 5 MG tablet TAKE ONE TABLET (5 MG) BY MOUTH TWO TIMES DAILY.  . Tamsulosin HCl (FLOMAX) 0.4 MG CAPS Take 0.4 mg by mouth 2 (two) times daily.       Allergies:   Patient has no known allergies.   Social History   Socioeconomic History  . Marital status: Widowed    Spouse name: None  . Number of children: None  . Years of education: None  . Highest education level: None  Social Needs  . Financial resource strain: None  . Food insecurity - worry: None  . Food insecurity - inability: None  . Transportation needs - medical: None  . Transportation needs - non-medical: None  Occupational History  . Occupation: retired  Tobacco Use  . Smoking status: Never Smoker  . Smokeless tobacco: Never  Used  Substance and Sexual Activity  . Alcohol use: No    Comment: occsaoinal  . Drug use: Yes    Types: Other-see comments  . Sexual activity: None  Other Topics Concern  . None  Social History Narrative  . None     Family History: The patient's family history includes Coronary artery disease in his unknown relative.  ROS:   Please see the history of present illness.    ROS  All other systems reviewed and negative.   EKGs/Labs/Other Studies Reviewed:    The following studies were reviewed today: none  EKG:  EKG is  ordered today.  The ekg ordered today demonstrates sinus bradycardia at 46bpm with sinus arrhythmia  Recent Labs: No results found for requested labs within last 8760 hours.   Recent Lipid Panel    Component Value Date/Time   CHOL 107 06/19/2016  0809   TRIG 34 06/19/2016 0809   HDL 56 06/19/2016 0809   CHOLHDL 1.9 06/19/2016 0809   VLDL 7 06/19/2016 0809   LDLCALC 44 06/19/2016 0809    Physical Exam:    VS:  BP (!) 178/84   Pulse (!) 46   Ht 6\' 2"  (1.88 m)   Wt 158 lb 12.8 oz (72 kg)   BMI 20.39 kg/m     Wt Readings from Last 3 Encounters:  07/02/17 158 lb 12.8 oz (72 kg)  06/23/16 159 lb 12.8 oz (72.5 kg)  07/01/15 157 lb (71.2 kg)     GEN:  Well nourished, well developed in no acute distress HEENT: Normal NECK: No JVD; No carotid bruits LYMPHATICS: No lymphadenopathy CARDIAC: RRR, no murmurs, rubs, gallops RESPIRATORY:  Clear to auscultation without rales, wheezing or rhonchi  ABDOMEN: Soft, non-tender, non-distended MUSCULOSKELETAL:  No edema; No deformity  SKIN: Warm and dry NEUROLOGIC:  Alert and oriented x 3 PSYCHIATRIC:  Normal affect   ASSESSMENT:    1. Persistent atrial fibrillation ([place])   2. HYPERTENSION, BENIGN   3. Coronary artery disease involving native coronary artery of native heart without angina pectoris   4. Dyslipidemia    PLAN:    In order of problems listed above:  1. Persistent atrial fibrillation - he continues to remain in sinus bradycardia.  He is off flecainide due to possible proarrhythmia with VT in the past.  He will continue on Pindolol 5mg  BID. He has a CHADS2VASC score of 4 and has refused anticoagulation in the past due to being very active with skiing.  He understands the risk involved with cardioembolic events.    2. HTN - BP poorly controlled on exam today but at home runs 131/36mmHg.  Repeat BP in office even higher so I am wondering if his BP cuff at home is accurate.  He will continue on Pindolol 5mg  BID and Clonidine. I will get a 24 hour BP monitor.   3. ASCAD s/p BMS to the RCA - he has not had any angina and remains quite active. He will continue on ASA 81mg  daily, atorvastatin 20mg  daily and Clonidine 0.1mg  BID.    4. Hyperlipidemia with LDL goal < 70.   Continue statin. LDL at goal at 58 a year ago.  I will repeat FLP and ALT.      Medication Adjustments/Labs and Tests Ordered: Current medicines are reviewed at length with the patient today.  Concerns regarding medicines are outlined above.  No orders of the defined types were placed in this encounter.  No orders of the defined types were  placed in this encounter.   Signed, Fransico Him, MD  07/02/2017 12:10 PM    [place]

## 2017-07-02 NOTE — Patient Instructions (Signed)
Medication Instructions:  Your physician recommends that you continue on your current medications as directed. Please refer to the Current Medication list given to you today.   Labwork: None ordered  Testing/Procedures: Your physician wants you to wear a 24 hour Blood Pressure Monitor.  Follow-Up: Your physician wants you to follow-up in: 1 year with Dr. Radford Pax. You will receive a reminder letter in the mail two months in advance. If you don't receive a letter, please call our office to schedule the follow-up appointment.   Any Other Special Instructions Will Be Listed Below (If Applicable).     If you need a refill on your cardiac medications before your next appointment, please call your pharmacy.

## 2017-07-03 ENCOUNTER — Other Ambulatory Visit: Payer: Self-pay | Admitting: Cardiology

## 2017-07-08 ENCOUNTER — Telehealth: Payer: Self-pay | Admitting: Cardiology

## 2017-07-08 NOTE — Telephone Encounter (Signed)
New message      Patient called back , spoke with [place] the other day about herbal supplement and his bp, he would like to follow up with her and go over somethings that has happened.   Patient states he will call back to talk with [place] later.

## 2017-07-08 NOTE — Telephone Encounter (Signed)
Spoke with patient and he feels that his elevated BP of 201/92 on 07/02/17 was more stress and anxiety related. Patient states he has stopped the herbal supplement and his BP is much better, he also has a follow up appointment on 12/27 at  [facility]. He would like to give bp readings  12/14  116/71 HR 60 12/15 AM 140/80 HR 56   PM--129/76 HR 54 12/16 PM 131/75 HR 52 12/17 AM 125/79 HR 58    PM 138/80 HR54 12/18 126/81 HR67 12/19 124/78 HR69  Informed patient to keep appointment on 12/27 and I will forward BP reading to Dr. Radford Pax to review. Patient verbalized understanding and thanked me for the call.

## 2017-07-08 NOTE — Telephone Encounter (Signed)
BPs much improved - have patient continue to follow once daily and keep appt

## 2017-07-15 ENCOUNTER — Encounter: Payer: Self-pay | Admitting: Pharmacist

## 2017-07-15 ENCOUNTER — Ambulatory Visit (INDEPENDENT_AMBULATORY_CARE_PROVIDER_SITE_OTHER): Payer: Medicare Other | Admitting: Pharmacist

## 2017-07-15 VITALS — BP 158/76 | HR 58

## 2017-07-15 DIAGNOSIS — I10 Essential (primary) hypertension: Secondary | ICD-10-CM

## 2017-07-15 DIAGNOSIS — I2510 Atherosclerotic heart disease of native coronary artery without angina pectoris: Secondary | ICD-10-CM | POA: Diagnosis not present

## 2017-07-15 MED ORDER — CLONIDINE HCL 0.1 MG PO TABS: 0.1000 mg | ORAL_TABLET | Freq: Three times a day (TID) | ORAL | 0 refills | Status: DC

## 2017-07-15 NOTE — Progress Notes (Signed)
Patient ID: [name]                 DOB: [birth year]                      MRN: [id number]     HPI: DAILY CRATE is a 76 y.o. male referred by Dr. Radford Pax to HTN clinic. PMH includes CAD, hyperlipidemia, hypertension, and Afib.  Elevated BP of 201/92 was noted during regula check up with Dr Radford Pax on 07/02/2017. 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 Patient home BP readings average 131/70 but need to calibrate home BP machine and assess technique during HTN clinic assessment.  Patient presents to clinic and denies chest pain, headaches, swelling, blurry vision, or any problems with current therapy. He reports increase anxiety and stress due to health problems in the family. His 66 years old son-in-law has terminal cancer and patient is helping his daughter as much as possible.   Current HTN meds:  Pindolol 5mg  twice daily Clonidine 0.1mg  twice daily  Intolerance: none  BP goal: 130/80  Family History:   Social History: denies tobacco use; reports occasional alcohol use  Diet: eating out more this days   Exercise: daily work out   Home BP readings:   25 readings; average 125/73 (HR 48 - 74 bpm)  **[place] (76yrs old); not accurate with > 5mmHg different from manual readings**   Wt Readings from Last 3 Encounters:  07/02/17 158 lb 12.8 oz (72 kg)  06/23/16 159 lb 12.8 oz (72.5 kg)  07/01/15 157 lb (71.2 kg)   BP Readings from Last 3 Encounters:  07/15/17 (!) 158/76  07/02/17 (!) 201/92  06/23/16 134/68   Pulse Readings from Last 3 Encounters:  07/15/17 (!) 58  07/02/17 (!) 42  06/23/16 60    Renal function: CrCl cannot be calculated (Patient's most recent lab result is older than the maximum 21 days allowed.).  Past Medical History:  Diagnosis Date  . CAD (coronary artery disease)    s/p bare metal stend to RCA  . Chronic venous insufficiency   . HTN (hypertension)     . PAF (paroxysmal atrial fibrillation) (HCC)    Not on long term anticoagluation despite CHADS2VASC score of 4 due to patient refusal because he likes to ski and does not want to risk a fall with bleeding complications.  He understands his cardioembolic risk associated with PAF.   Marland Kitchen Pneumothorax   . Ventricular tachycardia (HCC)    possibly proarrhythmia from flecainide    Current Outpatient Medications on File Prior to Visit  Medication Sig Dispense Refill  . Ascorbic Acid (VITAMIN C PO) Take by mouth daily.    Marland Kitchen aspirin 81 MG tablet Take 81 mg by mouth daily.      Marland Kitchen atorvastatin (LIPITOR) 20 MG tablet TAKE ONE TABLET (20 MG) BY MOUTH DAILY. 90 tablet 3  . Cholecalciferol (D-3-5) 5000 UNITS capsule Take 5,000 Units by mouth daily.    . Coenzyme Q10 (CO Q 10 PO) Take 1 tablet by mouth daily.    . Cyanocobalamin (B-12) 2500 MCG TABS Take by mouth daily.    . fish oil-omega-3 fatty acids 1000 MG capsule Take 2 g by mouth daily.      Marland Kitchen glucosamine-chondroitin 500-400 MG tablet Take 1 tablet by mouth daily.      . Multiple Vitamin (MULTIVITAMIN) tablet Take 1 tablet by mouth daily.      Marland Kitchen  nitroGLYCERIN (NITROSTAT) 0.4 MG SL tablet Place 1 tablet (0.4 mg total) under the tongue every 5 (five) minutes as needed for chest pain. 25 tablet 5  . pindolol (VISKEN) 5 MG tablet TAKE ONE TABLET (5 MG) BY MOUTH TWO TIMES DAILY. 180 tablet 2  . Tamsulosin HCl (FLOMAX) 0.4 MG CAPS Take 0.4 mg by mouth 2 (two) times daily.       No current facility-administered medications on file prior to visit.     No Known Allergies  Blood pressure (!) 158/76, pulse (!) 58, SpO2 99 %.  HYPERTENSION, BENIGN Blood pressure remains above desired goal of 130/80 today and home BP device was determined to be inaccurate with > 42mmHg difference from manual readings. Patient denies previous exposure to ACEi or ARBs but last BMET in system is > 42 years old. Will increase clonidine to 0.1mg  three times daily and patient is to  change home BP device. Plan to f/u in 4 weeks with HTN clinic and add ACEi to therapy is renal function stable.   Mathea Frieling Rodriguez-Guzman PharmD, BCPS, [facility] [address] [place] [address] 07/15/2017 9:34 PM

## 2017-07-15 NOTE — Assessment & Plan Note (Signed)
Blood pressure remains above desired goal of 130/80 today and home BP device was determined to be inaccurate with > 48mmHg difference from manual readings. Patient denies previous exposure to ACEi or ARBs but last BMET in system is > 78 years old. Will increase clonidine to 0.1mg  three times daily and patient is to change home BP device. Plan to f/u in 4 weeks with HTN clinic and add ACEi to therapy is renal function stable.

## 2017-07-15 NOTE — Patient Instructions (Addendum)
Return for a a follow up appointment in 4 weeks  Check your blood pressure at home daily (if able) and keep record of the readings.  Take your BP meds as follows: *INCREASE clonidine to 0.1mg  three times daily* CONTINUE all other medication as previously prescribed  Bring all of your meds, your BP cuff and your record of home blood pressures to your next appointment.  Exercise as you're able, try to walk approximately 30 minutes per day.  Keep salt intake to a minimum, especially watch canned and prepared boxed foods.  Eat more fresh fruits and vegetables and fewer canned items.  Avoid eating in fast food restaurants.    HOW TO TAKE YOUR BLOOD PRESSURE: . Rest 5 minutes before taking your blood pressure. .  Don't smoke or drink caffeinated beverages for at least 30 minutes before. . Take your blood pressure before (not after) you eat. . Sit comfortably with your back supported and both feet on the floor (don't cross your legs). . Elevate your arm to heart level on a table or a desk. . Use the proper sized cuff. It should fit smoothly and snugly around your bare upper arm. There should be enough room to slip a fingertip under the cuff. The bottom edge of the cuff should be 1 inch above the crease of the elbow. . Ideally, take 3 measurements at one sitting and record the average.

## 2017-07-16 LAB — BASIC METABOLIC PANEL
BUN / CREAT RATIO: 9 — AB (ref 10–24)
BUN: 10 mg/dL (ref 8–27)
CALCIUM: 9.3 mg/dL (ref 8.6–10.2)
CO2: 27 mmol/L (ref 20–29)
CREATININE: 1.07 mg/dL (ref 0.76–1.27)
Chloride: 105 mmol/L (ref 96–106)
GFR calc non Af Amer: 66 mL/min/{1.73_m2} (ref >59)
GFR, EST AFRICAN AMERICAN: 76 mL/min/{1.73_m2} (ref >59)
Glucose: 86 mg/dL (ref 65–99)
Potassium: 4.3 mmol/L (ref 3.5–5.2)
Sodium: 145 mmol/L — ABNORMAL HIGH (ref 134–144)

## 2017-07-27 ENCOUNTER — Telehealth: Payer: Self-pay | Admitting: Cardiology

## 2017-07-27 NOTE — Telephone Encounter (Signed)
Ok to take cinnamon with other medications, there are no major interactions.

## 2017-07-27 NOTE — Telephone Encounter (Signed)
[name] is calling because he is interested in taking a Cinnamon Tablet 500 mg for joint control. Wants to know if this would interfere with his current medication ? Please call

## 2017-07-27 NOTE — Telephone Encounter (Signed)
Please forward to [facility] to review.  I am not aware of any interactions

## 2017-07-30 NOTE — Telephone Encounter (Signed)
Left message on VM okay to take cinnamon per Pepper Pike, [facility].

## 2017-08-02 ENCOUNTER — Other Ambulatory Visit: Payer: Self-pay | Admitting: Pharmacist Clinician (PhC)/ Clinical Pharmacy Specialist

## 2017-08-02 MED ORDER — CLONIDINE HCL 0.1 MG PO TABS: 0.1000 mg | ORAL_TABLET | Freq: Three times a day (TID) | ORAL | 1 refills | Status: DC

## 2017-08-12 ENCOUNTER — Ambulatory Visit (INDEPENDENT_AMBULATORY_CARE_PROVIDER_SITE_OTHER): Payer: Medicare Other | Admitting: Pharmacist

## 2017-08-12 VITALS — BP 138/80 | HR 56

## 2017-08-12 DIAGNOSIS — I10 Essential (primary) hypertension: Secondary | ICD-10-CM

## 2017-08-12 NOTE — Progress Notes (Signed)
Patient ID: JAMAURY GUMZ                 DOB: 26-Dec-1940                      MRN: [id number]     HPI: [name] is a 77 y.o. male referred by Dr. Radford Pax to HTN clinic. PMH includes CAD, hyperlipidemia, hypertension, and Afib. During most recent HTN follow up visit on 07/15/17 I increased his clonidine 0.1mg  from twice daily to TID. BMET was repeated on 12/27 as well an showed stable renal function. Patient presents to clinic today and denies chest pain, headaches, swelling, blurry vision, or any problems with current therapy.  His 67 years old son-in-law passed away from from terminal cancer 3 weeks ago.  Current HTN meds:  Pindolol 5mg  twice daily Clonidine 0.1mg  three times daily  Intolerance: none  BP goal: 130/80  Family History: no significant  Social History: denies tobacco use; reports occasional alcohol use  Diet: eating out more this days   Exercise: daily work out   Home BP readings:   25 readings; average 126/74 (HR 55 - 81 bpm)  **[employer] BP cuff calibrated and determined to be accurate within 60mmHg**  Wt Readings from Last 3 Encounters:  07/02/17 158 lb 12.8 oz (72 kg)  06/23/16 159 lb 12.8 oz (72.5 kg)  07/01/15 157 lb (71.2 kg)   BP Readings from Last 3 Encounters:  08/12/17 138/80  07/15/17 (!) 158/76  07/02/17 (!) 201/92   Pulse Readings from Last 3 Encounters:  08/12/17 (!) 56  07/15/17 (!) 58  07/02/17 (!) 42    Past Medical History:  Diagnosis Date  . CAD (coronary artery disease)    s/p bare metal stend to RCA  . Chronic venous insufficiency   . HTN (hypertension)   . PAF (paroxysmal atrial fibrillation) (HCC)    Not on long term anticoagluation despite CHADS2VASC score of 4 due to patient refusal because he likes to ski and does not Reyes to risk a fall with bleeding complications.  He understands his cardioembolic risk associated with PAF.   Marland Kitchen Pneumothorax   . Ventricular tachycardia (HCC)    possibly proarrhythmia from  flecainide    Current Outpatient Medications on File Prior to Visit  Medication Sig Dispense Refill  . Ascorbic Acid (VITAMIN C PO) Take by mouth daily.    Marland Kitchen aspirin 81 MG tablet Take 81 mg by mouth daily.      Marland Kitchen atorvastatin (LIPITOR) 20 MG tablet TAKE ONE TABLET (20 MG) BY MOUTH DAILY. 90 tablet 3  . Cholecalciferol (D-3-5) 5000 UNITS capsule Take 5,000 Units by mouth daily.    . cloNIDine (CATAPRES) 0.1 MG tablet Take 1 tablet (0.1 mg total) by mouth 3 (three) times daily. 270 tablet 1  . Coenzyme Q10 (CO Q 10 PO) Take 1 tablet by mouth daily.    . Cyanocobalamin (B-12) 2500 MCG TABS Take by mouth daily.    . fish oil-omega-3 fatty acids 1000 MG capsule Take 2 g by mouth daily.      Marland Kitchen glucosamine-chondroitin 500-400 MG tablet Take 1 tablet by mouth daily.      . Multiple Vitamin (MULTIVITAMIN) tablet Take 1 tablet by mouth daily.      . nitroGLYCERIN (NITROSTAT) 0.4 MG SL tablet Place 1 tablet (0.4 mg total) under the tongue every 5 (five) minutes as needed for chest pain. 25 tablet 5  . Tamsulosin HCl (FLOMAX)  0.4 MG CAPS Take 0.4 mg by mouth 2 (two) times daily.       No current facility-administered medications on file prior to visit.     No Known Allergies  Blood pressure 138/80, pulse (!) 56, SpO2 96 %.  HYPERTENSION, BENIGN Blood well controlled in office and at home with an average BP reading of 126/74. Patient denies problems with current therapy or any adverse drug events.  Will continue current medication without changes and follow up in 8 weeks to assure stability in blood pressure. Renal function remain appropriate to initiate ACEi in the future if needed.   Dhwani Venkatesh Rodriguez-Guzman PharmD, BCPS, [place] [id number] 08/15/2017 7:13 PM

## 2017-08-12 NOTE — Patient Instructions (Signed)
Return for a  follow up appointment in 8 weeks  Check your blood pressure at home daily (if able) and keep record of the readings.  Take your BP meds as follows: *CONTINUE all medication as previously prescribed*  Bring all of your meds, your BP cuff and your record of home blood pressures to your next appointment.  Exercise as you're able, try to walk approximately 30 minutes per day.  Keep salt intake to a minimum, especially watch canned and prepared boxed foods.  Eat more fresh fruits and vegetables and fewer canned items.  Avoid eating in fast food restaurants.    HOW TO TAKE YOUR BLOOD PRESSURE: . Rest 5 minutes before taking your blood pressure. .  Don't smoke or drink caffeinated beverages for at least 30 minutes before. . Take your blood pressure before (not after) you eat. . Sit comfortably with your back supported and both feet on the floor (don't cross your legs). . Elevate your arm to heart level on a table or a desk. . Use the proper sized cuff. It should fit smoothly and snugly around your bare upper arm. There should be enough room to slip a fingertip under the cuff. The bottom edge of the cuff should be 1 inch above the crease of the elbow. . Ideally, take 3 measurements at one sitting and record the average.

## 2017-08-13 ENCOUNTER — Other Ambulatory Visit: Payer: Self-pay | Admitting: Cardiology

## 2017-08-14 ENCOUNTER — Other Ambulatory Visit: Payer: Self-pay | Admitting: Cardiology

## 2017-08-15 ENCOUNTER — Encounter: Payer: Self-pay | Admitting: Pharmacist

## 2017-08-15 NOTE — Assessment & Plan Note (Addendum)
Blood well controlled in office and at home with an average BP reading of 126/74. Patient denies problems with current therapy or any adverse drug events.  Will continue current medication without changes and follow up in 8 weeks to assure stability in blood pressure. Renal function remain appropriate to initiate ACEi in the future if needed.

## 2017-08-16 NOTE — Telephone Encounter (Signed)
Medication Detail    Disp Refills Start End   cloNIDine (CATAPRES) 0.1 MG tablet 270 tablet 1 08/02/2017    Sig - Route: Take 1 tablet (0.1 mg total) by mouth 3 (three) times daily. - Oral   Sent to pharmacy as: cloNIDine (CATAPRES) 0.1 MG tablet   E-Prescribing Status: Receipt confirmed by pharmacy (08/02/2017 3:26 PM EST)   Pharmacy   [id number] - [place], [place]

## 2017-08-18 ENCOUNTER — Other Ambulatory Visit: Payer: Self-pay | Admitting: Pharmacist Clinician (PhC)/ Clinical Pharmacy Specialist

## 2017-08-18 MED ORDER — PINDOLOL 5 MG PO TABS: 5.0000 mg | ORAL_TABLET | Freq: Two times a day (BID) | ORAL | 2 refills | Status: DC

## 2017-09-30 ENCOUNTER — Telehealth: Payer: Self-pay | Admitting: Pharmacist Clinician (PhC)/ Clinical Pharmacy Specialist

## 2017-09-30 NOTE — Telephone Encounter (Signed)
Patient called to report increase in weight and "fluid retention".  States he attributes this to increase in clonidine from 0.1 mg bid to 0.1 mg tid.  Says that he saw this on the internet, with the recommendation to give fluid pills when taking clonidine.    I assured him that we don't associate clonidine with fluid retention and have no manufacturer recommendations to give fluid pills when using clonidine.  Suggested that if he is concerned he can go back to twice daily dosing and see if his symptoms improve.   Patient agreeable to this, will call if he has further concerns

## 2017-10-07 ENCOUNTER — Ambulatory Visit: Payer: Medicare Other

## 2017-10-14 ENCOUNTER — Encounter: Payer: Self-pay | Admitting: Pharmacist Clinician (PhC)/ Clinical Pharmacy Specialist

## 2017-10-14 ENCOUNTER — Ambulatory Visit (INDEPENDENT_AMBULATORY_CARE_PROVIDER_SITE_OTHER): Payer: Medicare Other | Admitting: Pharmacist Clinician (PhC)/ Clinical Pharmacy Specialist

## 2017-10-14 DIAGNOSIS — I10 Essential (primary) hypertension: Secondary | ICD-10-CM | POA: Diagnosis not present

## 2017-10-14 NOTE — Assessment & Plan Note (Signed)
Patient with hypertension, with some aspect of white coat syndrome.  His home readings are all looking to be well WNL, however he still remains high when coming into the office.   Will not make any changes and asked that he continue checking home readings several times each week.  He can call the office should he have any further concerns for his pressure.  I will forward a note to Dr. Radford Pax regarding the right calf edema.

## 2017-10-14 NOTE — Progress Notes (Signed)
Patient ID: [name]                 DOB: 11/18/1940                      MRN: [id number]     HPI: [name] is a 77 y.o. male referred by Dr. Radford Pax to HTN clinic. PMH includes CAD, hyperlipidemia, hypertension, and Afib.  He has been doing well on his current regimen of pindolol and clonidine.    Today he denies any chest pain or tightness, shortness of breath, visual disturbances or dizziness.  He has recently noted that his right leg is swelling, below the knee, and because of this has also noted a 3-4 pound weight gain.  He reports that the swelling is not usually present in the morning, but increases as the day goes on, despite wearing compression stockings.  His left leg is fine.    Current HTN meds:  Pindolol 5mg  twice daily Clonidine 0.1mg  twice daily  Intolerance: none  BP goal: 130/80  Family History: no significant  Social History: denies tobacco use; reports occasional alcohol use  Diet: eating out more this days   Exercise: daily work out, helps with his grandchildren  Home BP readings:   Did not bring his home cuff today, but reports most readings in the 409W systolic, and today was 119/14 just before coming here.  **[employer] BP cuff calibrated and determined to be accurate within 38mmHg**  Wt Readings from Last 3 Encounters:  10/14/17 158 lb (71.7 kg)  07/02/17 158 lb 12.8 oz (72 kg)  06/23/16 159 lb 12.8 oz (72.5 kg)   BP Readings from Last 3 Encounters:  10/14/17 (!) 160/84  08/12/17 138/80  07/15/17 (!) 158/76   Pulse Readings from Last 3 Encounters:  10/14/17 72  08/12/17 (!) 56  07/15/17 (!) 58    Past Medical History:  Diagnosis Date  . CAD (coronary artery disease)    s/p bare metal stend to RCA  . Chronic venous insufficiency   . HTN (hypertension)   . PAF (paroxysmal atrial fibrillation) (HCC)    Not on long term anticoagluation despite CHADS2VASC score of 4 due to patient refusal because he likes to ski and does not want to  risk a fall with bleeding complications.  He understands his cardioembolic risk associated with PAF.   Marland Kitchen Pneumothorax   . Ventricular tachycardia (HCC)    possibly proarrhythmia from flecainide    Current Outpatient Medications on File Prior to Visit  Medication Sig Dispense Refill  . Ascorbic Acid (VITAMIN C PO) Take by mouth daily.    Marland Kitchen aspirin 81 MG tablet Take 81 mg by mouth daily.      Marland Kitchen atorvastatin (LIPITOR) 20 MG tablet TAKE ONE TABLET (20 MG) BY MOUTH DAILY. 90 tablet 3  . Cholecalciferol (D-3-5) 5000 UNITS capsule Take 5,000 Units by mouth daily.    . cloNIDine (CATAPRES) 0.1 MG tablet Take 1 tablet (0.1 mg total) by mouth 3 (three) times daily. 270 tablet 1  . Coenzyme Q10 (CO Q 10 PO) Take 1 tablet by mouth daily.    . Cyanocobalamin (B-12) 2500 MCG TABS Take by mouth daily.    . fish oil-omega-3 fatty acids 1000 MG capsule Take 2 g by mouth daily.      Marland Kitchen glucosamine-chondroitin 500-400 MG tablet Take 1 tablet by mouth daily.      . Multiple Vitamin (MULTIVITAMIN) tablet Take 1 tablet by  mouth daily.      . nitroGLYCERIN (NITROSTAT) 0.4 MG SL tablet Place 1 tablet (0.4 mg total) under the tongue every 5 (five) minutes as needed for chest pain. 25 tablet 5  . pindolol (VISKEN) 5 MG tablet Take 1 tablet (5 mg total) by mouth 2 (two) times daily. 180 tablet 2  . Tamsulosin HCl (FLOMAX) 0.4 MG CAPS Take 0.4 mg by mouth 2 (two) times daily.       No current facility-administered medications on file prior to visit.     No Known Allergies  Blood pressure (!) 160/84, pulse 72, height 6\' 2"  (1.88 m), weight 158 lb (71.7 kg).  HYPERTENSION, BENIGN Patient with hypertension, with some aspect of white coat syndrome.  His home readings are all looking to be well WNL, however he still remains high when coming into the office.   Will not make any changes and asked that he continue checking home readings several times each week.  He can call the office should he have any further concerns  for his pressure.  I will forward a note to Dr. Radford Pax regarding the right calf edema.    Tommy Medal PharmD CPP [facility] [address] [place] [id number] 10/14/2017 7:37 PM

## 2017-10-14 NOTE — Patient Instructions (Addendum)
Call if you notice that your home BP readings are consistently >202 systolic  Your blood pressure today is 160/84   Check your blood pressure at home daily (if able) and keep record of the readings.  Take your BP meds as follows:  Continue with your current medications  Bring all of your meds, your BP cuff and your record of home blood pressures to your next appointment.  Exercise as you're able, try to walk approximately 30 minutes per day.  Keep salt intake to a minimum, especially watch canned and prepared boxed foods.  Eat more fresh fruits and vegetables and fewer canned items.  Avoid eating in fast food restaurants.    HOW TO TAKE YOUR BLOOD PRESSURE: . Rest 5 minutes before taking your blood pressure. .  Don't smoke or drink caffeinated beverages for at least 30 minutes before. . Take your blood pressure before (not after) you eat. . Sit comfortably with your back supported and both feet on the floor (don't cross your legs). . Elevate your arm to heart level on a table or a desk. . Use the proper sized cuff. It should fit smoothly and snugly around your bare upper arm. There should be enough room to slip a fingertip under the cuff. The bottom edge of the cuff should be 1 inch above the crease of the elbow. . Ideally, take 3 measurements at one sitting and record the average.

## 2017-10-17 ENCOUNTER — Other Ambulatory Visit: Payer: Self-pay | Admitting: Cardiology

## 2017-10-18 ENCOUNTER — Other Ambulatory Visit: Payer: Self-pay | Admitting: *Deleted

## 2017-10-18 ENCOUNTER — Telehealth: Payer: Self-pay | Admitting: Cardiology

## 2017-10-18 DIAGNOSIS — R0602 Shortness of breath: Secondary | ICD-10-CM

## 2017-10-18 DIAGNOSIS — M7989 Other specified soft tissue disorders: Secondary | ICD-10-CM

## 2017-10-18 MED ORDER — NITROGLYCERIN 0.4 MG SL SUBL: 0.4000 mg | SUBLINGUAL_TABLET | SUBLINGUAL | 3 refills | Status: DC | PRN

## 2017-10-18 NOTE — Telephone Encounter (Signed)
I spoke with patient. Patient states 5lb weight gain, right calf swelling and states he gets winded with activity. Patient states right calf swelling started about 10 days ago, he states the swelling increases throughout the day. Swelling improves at night once his leg is elevated. Patient denies redness, warmth or tenderness.  Patient reported symtpoms to [place], [facility] on 10/14/17 while at HTN clinic at [facility]. Patient also states he is very active, he walks everyday, but the past few days he has been more winded with his walks. I informed patient that I would send to Dr.Turner for further recommendations. Patient verbalized understanding.

## 2017-10-18 NOTE — Telephone Encounter (Signed)
New Message   Pt c/o swelling: STAT is pt has developed SOB within 24 hours  1) How much weight have you gained and in what time span? 5lbs in about a week   If swelling, where is the swelling located? Below the knee 2) Are you currently taking a fluid pill?   3) Are you currently SOB? No. Only short of breath when moving around are working out.    4) Do you have a log of your daily weights (if so, list)? No  5) Have you gained 3 pounds in a day or 5 pounds in a week? Yes    Have you traveled recently? no

## 2017-10-19 ENCOUNTER — Telehealth: Payer: Self-pay

## 2017-10-19 NOTE — Addendum Note (Signed)
Addended by: Lynann Bologna on: 10/19/2017 01:41 PM   Modules accepted: Orders

## 2017-10-19 NOTE — Telephone Encounter (Signed)
Spoke with pt he is fine with MD's recommendations. Pt is aware of appointment with Dr. Radford Pax for tomorrow 4/3 at 10:40 AM. Orders were placed for chest CT, 2D echo,LE doppler  and BMET.   Pt needs to know that he needs to come to the office today to get blood work today prior chest CT at 9:00 AM tomorrow prior seen Dr. Radford Pax. Left pt a detail message and  to call back.

## 2017-10-19 NOTE — Telephone Encounter (Addendum)
Please get STAT chest CT to rule out acute PE, le venous dopplers to rule out DVT and 2D echo to assess LVF.  ALso needs OV this week

## 2017-10-19 NOTE — Telephone Encounter (Signed)
Called pt  on his home and his cell phone. Left pt a message to call back regarding Dr. Theodosia Blender recommendations: MD recommends to get STAT chest CT to R/O acute PE, left venous doppler to R/O DVT and 2D echo to assess LVF. Also needs OV this week.

## 2017-10-19 NOTE — Telephone Encounter (Signed)
Spoke with the pts Daughter and she will endorse to him that he needs to arrive at 0730 to have his STAT BMET done here at our office, prior to his scheduled CT angio at 0900.  Endorsed to the daughter that we have been unable to make contact with the pt to endorse STAT lab needed prior to his CT.  Daughter verbalized understanding and agrees with this plan.  Daughter states she will endorse this to the pt, for she will be seeing him in 30 mins.

## 2017-10-19 NOTE — Telephone Encounter (Signed)
LPM:  Labs 4/2; Chest CT 4/3 @ 0900; OV @ 10:40.Marland KitchenMarland Kitchen

## 2017-10-19 NOTE — Telephone Encounter (Signed)
Left patient final message regarding lab work, Chest CT and appt with Dr Radford Pax...  Have not heard back from patient this afternoon after multiple attempts.Marland Kitchen

## 2017-10-20 ENCOUNTER — Other Ambulatory Visit: Payer: Medicare Other | Admitting: *Deleted

## 2017-10-20 ENCOUNTER — Ambulatory Visit (INDEPENDENT_AMBULATORY_CARE_PROVIDER_SITE_OTHER)
Admission: RE | Admit: 2017-10-20 | Discharge: 2017-10-20 | Disposition: A | Discharge status: Home / Self Care | Payer: Medicare Other | Source: Ambulatory Visit | Attending: Cardiology | Admitting: Cardiology

## 2017-10-20 ENCOUNTER — Ambulatory Visit: Payer: Medicare Other | Admitting: Cardiology

## 2017-10-20 DIAGNOSIS — R0602 Shortness of breath: Secondary | ICD-10-CM

## 2017-10-20 DIAGNOSIS — M7989 Other specified soft tissue disorders: Secondary | ICD-10-CM

## 2017-10-20 LAB — BASIC METABOLIC PANEL
BUN / CREAT RATIO: 14 (ref 10–24)
BUN: 15 mg/dL (ref 8–27)
CO2: 27 mmol/L (ref 20–29)
CREATININE: 1.1 mg/dL (ref 0.76–1.27)
Calcium: 9.2 mg/dL (ref 8.6–10.2)
Chloride: 107 mmol/L — ABNORMAL HIGH (ref 96–106)
GFR, EST AFRICAN AMERICAN: 74 mL/min/{1.73_m2} (ref >59)
GFR, EST NON AFRICAN AMERICAN: 64 mL/min/{1.73_m2} (ref >59)
GLUCOSE: 111 mg/dL — AB (ref 65–99)
Potassium: 4.5 mmol/L (ref 3.5–5.2)
SODIUM: 141 mmol/L (ref 134–144)

## 2017-10-20 MED ORDER — FUROSEMIDE 20 MG PO TABS: 20.0000 mg | ORAL_TABLET | ORAL | 3 refills | Status: DC | PRN

## 2017-10-20 MED ORDER — IOPAMIDOL (ISOVUE-370) INJECTION 76%
80.0000 mL | Freq: Once | INTRAVENOUS | Status: AC | PRN
  Administered 2017-10-20: 80 mL via INTRAVENOUS

## 2017-10-20 NOTE — Telephone Encounter (Signed)
This encounter was created in error - please disregard.

## 2017-10-20 NOTE — Telephone Encounter (Signed)
I spoke with patient in office today. Patient here today for Chest CT and labs. Patient is scheduled for LE venous doppler and echo tomorrow and patient rescheduled to with Dr. Radford Pax on 10/22/17 at 7:30AM. I reviewed upcoming appts with patient and explained that Dr. Radford Pax recommends lasix 20 mg daily as needed for LE edema. Patient in agreement with treatment plan.   Per Dr. Radford Pax Please get RLE venous doppler to rule out DVT. Then start Lasix 20mg  daily PRN for LE edema.

## 2017-10-21 ENCOUNTER — Ambulatory Visit (HOSPITAL_COMMUNITY)
Admission: RE | Admit: 2017-10-21 | Discharge: 2017-10-21 | Disposition: A | Discharge status: Home / Self Care | Payer: Medicare Other | Source: Ambulatory Visit | Attending: Cardiology | Admitting: Cardiology

## 2017-10-21 ENCOUNTER — Ambulatory Visit (HOSPITAL_BASED_OUTPATIENT_CLINIC_OR_DEPARTMENT_OTHER): Payer: Medicare Other

## 2017-10-21 ENCOUNTER — Other Ambulatory Visit: Payer: Self-pay

## 2017-10-21 DIAGNOSIS — I4891 Unspecified atrial fibrillation: Secondary | ICD-10-CM | POA: Insufficient documentation

## 2017-10-21 DIAGNOSIS — R000 Tachycardia, unspecified: Secondary | ICD-10-CM | POA: Insufficient documentation

## 2017-10-21 DIAGNOSIS — I082 Rheumatic disorders of both aortic and tricuspid valves: Secondary | ICD-10-CM | POA: Insufficient documentation

## 2017-10-21 DIAGNOSIS — I313 Pericardial effusion (noninflammatory): Secondary | ICD-10-CM | POA: Diagnosis not present

## 2017-10-21 DIAGNOSIS — I872 Venous insufficiency (chronic) (peripheral): Secondary | ICD-10-CM | POA: Diagnosis not present

## 2017-10-21 DIAGNOSIS — E785 Hyperlipidemia, unspecified: Secondary | ICD-10-CM | POA: Insufficient documentation

## 2017-10-21 DIAGNOSIS — R0602 Shortness of breath: Secondary | ICD-10-CM

## 2017-10-21 DIAGNOSIS — I2510 Atherosclerotic heart disease of native coronary artery without angina pectoris: Secondary | ICD-10-CM | POA: Diagnosis not present

## 2017-10-21 DIAGNOSIS — I119 Hypertensive heart disease without heart failure: Secondary | ICD-10-CM | POA: Diagnosis not present

## 2017-10-21 DIAGNOSIS — M7989 Other specified soft tissue disorders: Secondary | ICD-10-CM | POA: Diagnosis not present

## 2017-10-22 ENCOUNTER — Ambulatory Visit: Payer: Medicare Other | Admitting: Cardiology

## 2017-10-25 ENCOUNTER — Telehealth: Payer: Self-pay | Admitting: Cardiology

## 2017-10-25 ENCOUNTER — Telehealth: Payer: Self-pay

## 2017-10-25 DIAGNOSIS — I714 Abdominal aortic aneurysm, without rupture, unspecified: Secondary | ICD-10-CM

## 2017-10-25 MED ORDER — APIXABAN 5 MG PO TABS: 5.0000 mg | ORAL_TABLET | Freq: Two times a day (BID) | ORAL | 11 refills | Status: DC

## 2017-10-25 NOTE — Telephone Encounter (Signed)
Notes recorded by Teressa Senter, RN on 10/25/2017 at 5:19 PM EDT Patient made aware of CT, echo and LE [place]  results and Dr. Theodosia Blender recommendation to repeat Chest CT in 1 year. Patient in agreement with treatment plan and thankful for the call   Notes recorded by Sueanne Margarita, MD on 10/22/2017 at 11:35 PM EDT No PE. 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 This was not appreciated on echo. Please repeat CHest CT angio in 1 year to reassess

## 2017-10-25 NOTE — Telephone Encounter (Signed)
Patient is aware that samples are up front ready for pick up on Wednesday and a discount card for he first 30 days as well

## 2017-10-25 NOTE — Telephone Encounter (Signed)
-----   Message from Sueanne Margarita, MD sent at 10/23/2017 11:59 PM EDT ----- Patient back in atrial fibrillation - he has refused anticoagulation in the past.  Recommend starting eliquis 5mg  BID, stop ASA and needs to get into afib clinic ASAP Monday or Tuesday

## 2017-10-25 NOTE — Telephone Encounter (Signed)
Patient calling,  Patient states that he would like to know if his results are available.

## 2017-10-25 NOTE — Telephone Encounter (Signed)
Patient calling back, states that the office put some Eliquis coupons up front. Patient states that he will come pick it up on Wednesday.

## 2017-10-25 NOTE — Telephone Encounter (Signed)
Patient calling back,  *see previous note from today* Patient states that he spoke to nurse previously about picking up Eliquis samples and patient would like to know if he can pick Eliquis samples up on Wednesday.  Patient states that he would like to know if office has a  savings card for the Eliquis medication.

## 2017-10-25 NOTE — Telephone Encounter (Signed)
Patient is aware of echo results. Dr. Radford Pax recommend patient to start eliquis 5 mg BID, stop ASA and needs to get into A. FIB clinic ASAP Monday or Tuesday. Patient is unable to come in today for A. FIB clinic. Patient would like afternoon, so patient is going to the A. FIB clinic on Wednesday afternoon. Patient will start on eliquis, will leave sample at front desk and send in prescription.  Patient verbalized understanding.

## 2017-10-25 NOTE — Telephone Encounter (Signed)
F

## 2017-10-26 ENCOUNTER — Telehealth: Payer: Self-pay | Admitting: Cardiology

## 2017-10-26 NOTE — Telephone Encounter (Signed)
New Message   Patient is calling to obtain lab test results. Please call to discuss.

## 2017-10-26 NOTE — Telephone Encounter (Signed)
Informed pt of results. Pt verbalized understanding. 

## 2017-10-27 ENCOUNTER — Encounter (HOSPITAL_COMMUNITY): Payer: Self-pay | Admitting: Nurse Practitioner

## 2017-10-27 ENCOUNTER — Ambulatory Visit (HOSPITAL_COMMUNITY)
Admission: RE | Admit: 2017-10-27 | Discharge: 2017-10-27 | Disposition: A | Discharge status: Home / Self Care | Payer: Medicare Other | Source: Ambulatory Visit | Attending: Nurse Practitioner | Admitting: Nurse Practitioner

## 2017-10-27 VITALS — BP 132/84 | HR 119 | Ht 74.0 in | Wt 162.0 lb

## 2017-10-27 DIAGNOSIS — I481 Persistent atrial fibrillation: Secondary | ICD-10-CM

## 2017-10-27 DIAGNOSIS — I480 Paroxysmal atrial fibrillation: Secondary | ICD-10-CM | POA: Insufficient documentation

## 2017-10-27 DIAGNOSIS — Z79899 Other long term (current) drug therapy: Secondary | ICD-10-CM | POA: Diagnosis not present

## 2017-10-27 DIAGNOSIS — I10 Essential (primary) hypertension: Secondary | ICD-10-CM | POA: Insufficient documentation

## 2017-10-27 DIAGNOSIS — Z7982 Long term (current) use of aspirin: Secondary | ICD-10-CM | POA: Insufficient documentation

## 2017-10-27 DIAGNOSIS — I4819 Other persistent atrial fibrillation: Secondary | ICD-10-CM

## 2017-10-28 ENCOUNTER — Encounter: Payer: Self-pay | Admitting: Cardiology

## 2017-10-28 ENCOUNTER — Ambulatory Visit (INDEPENDENT_AMBULATORY_CARE_PROVIDER_SITE_OTHER): Payer: Medicare Other | Admitting: Cardiology

## 2017-10-28 VITALS — BP 138/80 | HR 67 | Wt 161.8 lb

## 2017-10-28 DIAGNOSIS — I5030 Unspecified diastolic (congestive) heart failure: Secondary | ICD-10-CM | POA: Insufficient documentation

## 2017-10-28 DIAGNOSIS — E785 Hyperlipidemia, unspecified: Secondary | ICD-10-CM | POA: Diagnosis not present

## 2017-10-28 DIAGNOSIS — I2510 Atherosclerotic heart disease of native coronary artery without angina pectoris: Secondary | ICD-10-CM | POA: Diagnosis not present

## 2017-10-28 DIAGNOSIS — I481 Persistent atrial fibrillation: Secondary | ICD-10-CM | POA: Diagnosis not present

## 2017-10-28 DIAGNOSIS — I4819 Other persistent atrial fibrillation: Secondary | ICD-10-CM

## 2017-10-28 DIAGNOSIS — I10 Essential (primary) hypertension: Secondary | ICD-10-CM | POA: Diagnosis not present

## 2017-10-28 DIAGNOSIS — I5032 Chronic diastolic (congestive) heart failure: Secondary | ICD-10-CM | POA: Diagnosis not present

## 2017-10-28 MED ORDER — FUROSEMIDE 20 MG PO TABS: 20.0000 mg | ORAL_TABLET | Freq: Every day | ORAL | 3 refills | Status: DC

## 2017-10-28 MED ORDER — APIXABAN 5 MG PO TABS: 5.0000 mg | ORAL_TABLET | Freq: Two times a day (BID) | ORAL | 11 refills | Status: DC

## 2017-10-28 NOTE — Progress Notes (Signed)
Cardiology Office Note:    Date:  10/28/2017   ID:  [name], DOB 1941/03/16, MRN [id number]  PCP:  Seward Carol, MD  Cardiologist:  No primary care provider on file.    Referring MD: Seward Carol, MD   Chief Complaint  Patient presents with  . Coronary Artery Disease  . Hypertension  . Atrial Fibrillation  . Hyperlipidemia    History of Present Illness:    [name] is a 77 y.o. 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 recently he has had problems with increasing shortness of breath lower extremity edema.  The echocardiogram showed low normal LV function with EF 50-55% was felt to possibly be underestimated due to atrial fibrillation.  He had mild AR, severe left atrial enlargement and a mild pericardial effusion.  He appear to be back in atrial fibrillation and prescription was sent in for Eliquis and he was referred back to A. fib clinic.  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  He was referred to orthopedics.  He was seen in A. fib clinic yesterday.  He was weight was up by 4 pounds compared to the week prior.  He remained in atrial fibrillation.  He had not started the Eliquis and after a long discussion by  Roderic Palau, NP, he refused to start the Eliquis until he spoke with me.  In regards to fluid overload he was currently taking 20 mg of Lasix daily and he was instructed to take an extra dose last night.  He is now here for follow-up.  He has not really lost any significant weight since  the extra dose of Lasix and is still 3 pounds up from his baseline weight.  He denies any chest pain or pressure, PND, orthopnea, dizziness, palpitations or syncope.  He says he has not really noticed any more shortness of breath.  He still intermittently has some lower extremity edema.  He says the cyst in his left knee is old and was noted by his orthopedic surgeon he has been getting steroid shots in his knee.  The right lower extremity lower extremity edema has significantly improved.  He is compliant with his meds and is tolerating meds with no SE.    Past Medical History:  Diagnosis Date  . CAD (coronary artery disease)    s/p bare metal stend to RCA  . Chronic diastolic CHF (congestive heart failure) ([place])   . Chronic venous insufficiency   . HTN (hypertension)   . PAF (paroxysmal atrial fibrillation) (HCC)    Not on long term anticoagluation despite CHADS2VASC score of 4 due to patient refusal because he likes to ski and does not want to risk a fall with bleeding complications.  He understands his cardioembolic risk associated with PAF.   Marland Kitchen Pneumothorax   . Ventricular tachycardia (HCC)    possibly proarrhythmia from flecainide    Past Surgical History:  Procedure Laterality Date  . INGUINAL HERNIA REPAIR     x2  Current Medications: Current Meds  Medication Sig  . Ascorbic Acid (VITAMIN C PO) Take by mouth daily.  Marland Kitchen atorvastatin (LIPITOR) 20 MG tablet TAKE ONE TABLET (20 MG) BY MOUTH DAILY.  Marland Kitchen Cholecalciferol (D-3-5) 5000 UNITS capsule Take 5,000 Units by mouth daily.  . cloNIDine (CATAPRES) 0.1 MG tablet Take 1 tablet (0.1 mg total) by mouth 3 (three) times daily.  . Coenzyme Q10 (CO Q 10 PO) Take 1 tablet by mouth daily.  . Cyanocobalamin (B-12) 2500 MCG TABS Take by mouth daily.  . fish oil-omega-3 fatty acids 1000 MG capsule Take 2 g by mouth daily.    Marland Kitchen glucosamine-chondroitin 500-400 MG tablet Take 1 tablet by mouth daily.    . Multiple Vitamin (MULTIVITAMIN) tablet  Take 1 tablet by mouth daily.    . nitroGLYCERIN (NITROSTAT) 0.4 MG SL tablet Place 1 tablet (0.4 mg total) under the tongue every 5 (five) minutes as needed for chest pain.  . pindolol (VISKEN) 5 MG tablet Take 1 tablet (5 mg total) by mouth 2 (two) times daily.  . Tamsulosin HCl (FLOMAX) 0.4 MG CAPS Take 0.4 mg by mouth 2 (two) times daily.    . [DISCONTINUED] aspirin EC 81 MG tablet Take 81 mg by mouth daily.  . [DISCONTINUED] furosemide (LASIX) 20 MG tablet Take 1 tablet (20 mg total) by mouth as needed. As needed daily for swelling     Allergies:   Patient has no known allergies.   Social History   Socioeconomic History  . Marital status: Widowed    Spouse name: Not on file  . Number of children: Not on file  . Years of education: Not on file  . Highest education level: Not on file  Occupational History  . Occupation: retired  Scientific laboratory technician  . Financial resource strain: Not on file  . Food insecurity:    Worry: Not on file    Inability: Not on file  . Transportation needs:    Medical: Not on file    Non-medical: Not on file  Tobacco Use  . Smoking status: Never Smoker  . Smokeless tobacco: Never Used  Substance and Sexual Activity  . Alcohol use: No    Comment: occsaoinal  . Drug use: Yes    Types: Other-see comments  . Sexual activity: Not on file  Lifestyle  . Physical activity:    Days per week: Not on file    Minutes per session: Not on file  . Stress: Not on file  Relationships  . Social connections:    Talks on phone: Not on file    Gets together: Not on file    Attends religious service: Not on file    Active member of club or organization: Not on file    Attends meetings of clubs or organizations: Not on file    Relationship status: Not on file  Other Topics Concern  . Not on file  Social History Narrative  . Not on file     Family History: The patient's none family history includes Coronary artery disease in his unknown relative.  ROS:   Please  see the history of present illness.    ROS  All other systems reviewed and negative.   EKGs/Labs/Other Studies Reviewed:    The following studies were reviewed today: Office visit - afib clinic  EKG:  EKG is not ordered today.    Recent Labs: 07/02/2017: ALT 17 10/20/2017: BUN 15; Creatinine, Ser 1.10; Potassium 4.5; Sodium 141   Recent Lipid Panel  Component Value Date/Time   CHOL 117 07/02/2017 0818   TRIG 39 07/02/2017 0818   HDL 58 07/02/2017 0818   CHOLHDL 2.0 07/02/2017 0818   CHOLHDL 1.9 06/19/2016 0809   VLDL 7 06/19/2016 0809   LDLCALC 51 07/02/2017 0818    Physical Exam:    VS:  BP 138/80   Pulse 67   Wt 161 lb 12.8 oz (73.4 kg)   SpO2 99%   BMI 20.77 kg/m     Wt Readings from Last 3 Encounters:  10/28/17 161 lb 12.8 oz (73.4 kg)  10/27/17 162 lb (73.5 kg)  10/14/17 158 lb (71.7 kg)     GEN:  Well nourished, well developed in no acute distress HEENT: Normal NECK: No JVD; No carotid bruits LYMPHATICS: No lymphadenopathy CARDIAC: RRR, no murmurs, rubs, gallops RESPIRATORY:  Clear to auscultation without rales, wheezing or rhonchi  ABDOMEN: Soft, non-tender, non-distended MUSCULOSKELETAL:  No edema; No deformity  SKIN: Warm and dry NEUROLOGIC:  Alert and oriented x 3 PSYCHIATRIC:  Normal affect   ASSESSMENT:    1. Persistent atrial fibrillation ([place])   2. HYPERTENSION, BENIGN   3. Coronary artery disease involving native coronary artery of native heart without angina pectoris   4. Chronic diastolic CHF (congestive heart failure) ([place])   5. Dyslipidemia    PLAN:    In order of problems listed above:  1.  Persistent atrial fibrillation -he remains in atrial fibrillation today but rate controlled.  He will continue on pindolol 5 mg twice daily.  We had a very long discussion about anticoagulation today.  He has been leary to use anti-coagulation in the past because he is a skier but he says he is stopped seeing is not going to do it anymore  because of his age and worrying about injuries. His he agrees to start CHADSVASC score is 3 (CAD, HTN and CHF) and I have recommended that he go on Eliquis 5 mg twice daily.  We will then wait 4 weeks and I will see him back in the office and if still in atrial fibrillation we will set him up for outpatient cardioversion.  I stressed the importance of him continuing on chronic anticoagulation for a lifetime given his increased risk of stroke regardless of if he is in sinus rhythm or in A. Fib.    2.  Hypertension -blood pressures well controlled on exam today.  Continue on clonidine 0.1 mg 3 times daily and pindolol 5 mg twice daily.  3.  ASCAD - s/p remote BMS to the RCA.  He will continue on beta-blocker and statin.  I will stop his aspirin due to taking Eliquis.  4.  Chronic diastolic CHF - his weight is up 3 pounds from 10/14/2017 and when I last saw him in December.  He has trace lower extremity edema on exam.  I recommended increasing Lasix to 40 mg daily for 3 days then go back to 20 mg daily with an extra 20 mg as needed for lower extremity edema or weight gain  5.  Dyslipidemia with LDL goal less than 70 -he will continue on atorvastatin 20 mg daily.  LDL was 51 on 07/02/2017.    OnceMedication Adjustments/Labs and Tests Ordered: Current medicines are reviewed at length with the patient today.  Concerns regarding medicines are outlined above.  Orders Placed This Encounter  Procedures  . CBC  . TSH   Meds ordered this encounter  Medications  . apixaban (ELIQUIS) 5 MG TABS tablet  Sig: Take 1 tablet (5 mg total) by mouth 2 (two) times daily.    Dispense:  60 tablet    Refill:  11  . furosemide (LASIX) 20 MG tablet    Sig: Take 1 tablet (20 mg total) by mouth daily. Take extra 20 mg once a day as needed for edema    Dispense:  135 tablet    Refill:  3    Signed, Fransico Him, MD  10/28/2017 12:57 PM    [place]

## 2017-10-28 NOTE — Progress Notes (Signed)
Primary Care Physician: Seward Carol, MD Referring Physician: Dr. Ashley Jacobs KYLYN SOOKRAM is a 77 y.o. male with a h/o CAD, HTN, PAF not on anticoagulation, that is in the afib clinic for f/u of a phone call where pt mentioned that he had gained weight of 5 lbs with LEE, rt greater than left, and shortness of breath with activity. Chest CT was ordered as well as venous dopplers and 2D echo. When he arrived for echo he was in afib and referred here.  Today, the pt refuses an EKG. He was asked to start anticoagulation, Eliquis, by Dr. Radford Pax, but he went on the website and did not like what  he read, so he did not start. He seems to be somewhat in denial that he appears to have been in persistent afib for some time, tries to contribute his symptoms to other causes. Recent doppler studies were negative for DVT. Echo showed normal EF but with severely dilated left atrium. He has afib with RVR today, by auscultation, has been on pindolol for some time. His weight is up 4 lbs since last week.States  that he is not going to do anything different until he sees Dr. Radford Pax tomorrow.  Today, he denies symptoms of palpitations, chest pain, , orthopnea, PND,  dizziness, presyncope, syncope, or neurologic sequela. The patient is tolerating medications without difficulties and is otherwise without complaint today.   Past Medical History:  Diagnosis Date  . CAD (coronary artery disease)    s/p bare metal stend to RCA  . Chronic venous insufficiency   . HTN (hypertension)   . PAF (paroxysmal atrial fibrillation) (HCC)    Not on long term anticoagluation despite CHADS2VASC score of 4 due to patient refusal because he likes to ski and does not want to risk a fall with bleeding complications.  He understands his cardioembolic risk associated with PAF.   Marland Kitchen Pneumothorax   . Ventricular tachycardia (HCC)    possibly proarrhythmia from flecainide   Past Surgical History:  Procedure Laterality Date  .  INGUINAL HERNIA REPAIR     x2    Current Outpatient Medications  Medication Sig Dispense Refill  . Ascorbic Acid (VITAMIN C PO) Take by mouth daily.    Marland Kitchen aspirin EC 81 MG tablet Take 81 mg by mouth daily.    Marland Kitchen atorvastatin (LIPITOR) 20 MG tablet TAKE ONE TABLET (20 MG) BY MOUTH DAILY. 90 tablet 3  . Cholecalciferol (D-3-5) 5000 UNITS capsule Take 5,000 Units by mouth daily.    . cloNIDine (CATAPRES) 0.1 MG tablet Take 1 tablet (0.1 mg total) by mouth 3 (three) times daily. 270 tablet 1  . Coenzyme Q10 (CO Q 10 PO) Take 1 tablet by mouth daily.    . Cyanocobalamin (B-12) 2500 MCG TABS Take by mouth daily.    . fish oil-omega-3 fatty acids 1000 MG capsule Take 2 g by mouth daily.      . furosemide (LASIX) 20 MG tablet Take 1 tablet (20 mg total) by mouth as needed. As needed daily for swelling 25 tablet 3  . glucosamine-chondroitin 500-400 MG tablet Take 1 tablet by mouth daily.      . Multiple Vitamin (MULTIVITAMIN) tablet Take 1 tablet by mouth daily.      . nitroGLYCERIN (NITROSTAT) 0.4 MG SL tablet Place 1 tablet (0.4 mg total) under the tongue every 5 (five) minutes as needed for chest pain. 25 tablet 3  . pindolol (VISKEN) 5 MG tablet Take 1 tablet (5  mg total) by mouth 2 (two) times daily. 180 tablet 2  . Tamsulosin HCl (FLOMAX) 0.4 MG CAPS Take 0.4 mg by mouth 2 (two) times daily.       No current facility-administered medications for this encounter.     No Known Allergies  Social History   Socioeconomic History  . Marital status: Widowed    Spouse name: Not on file  . Number of children: Not on file  . Years of education: Not on file  . Highest education level: Not on file  Occupational History  . Occupation: retired  Scientific laboratory technician  . Financial resource strain: Not on file  . Food insecurity:    Worry: Not on file    Inability: Not on file  . Transportation needs:    Medical: Not on file    Non-medical: Not on file  Tobacco Use  . Smoking status: Never Smoker  .  Smokeless tobacco: Never Used  Substance and Sexual Activity  . Alcohol use: No    Comment: occsaoinal  . Drug use: Yes    Types: Other-see comments  . Sexual activity: Not on file  Lifestyle  . Physical activity:    Days per week: Not on file    Minutes per session: Not on file  . Stress: Not on file  Relationships  . Social connections:    Talks on phone: Not on file    Gets together: Not on file    Attends religious service: Not on file    Active member of club or organization: Not on file    Attends meetings of clubs or organizations: Not on file    Relationship status: Not on file  . Intimate partner violence:    Fear of current or ex partner: Not on file    Emotionally abused: Not on file    Physically abused: Not on file    Forced sexual activity: Not on file  Other Topics Concern  . Not on file  Social History Narrative  . Not on file    Family History  Problem Relation Age of Onset  . Coronary artery disease Unknown     ROS- All systems are reviewed and negative except as per the HPI above  Physical Exam: Vitals:   10/27/17 1448  BP: 132/84  Pulse: (!) 119  SpO2: 96%  Weight: 162 lb (73.5 kg)  Height: 6\' 2"  (1.88 m)   Wt Readings from Last 3 Encounters:  10/27/17 162 lb (73.5 kg)  10/14/17 158 lb (71.7 kg)  07/02/17 158 lb 12.8 oz (72 kg)    Labs: Lab Results  Component Value Date   NA 141 10/20/2017   K 4.5 10/20/2017   CL 107 (H) 10/20/2017   CO2 27 10/20/2017   GLUCOSE 111 (H) 10/20/2017   BUN 15 10/20/2017   CREATININE 1.10 10/20/2017   CALCIUM 9.2 10/20/2017   Lab Results  Component Value Date   INR 2.72 (H) 07/11/2009   Lab Results  Component Value Date   CHOL 117 07/02/2017   HDL 58 07/02/2017   LDLCALC 51 07/02/2017   TRIG 39 07/02/2017     GEN- The patient is well appearing, alert and oriented x 3 today.   Head- normocephalic, atraumatic Eyes-  Sclera clear, conjunctiva pink Ears- hearing intact Oropharynx-  clear Neck- supple, no JVP Lymph- no cervical lymphadenopathy Lungs- Clear to ausculation bilaterally, normal work of breathing Heart- irregular rate and rhythm, no murmurs, rubs or gallops, PMI not laterally displaced GI-  soft, NT, ND, + BS Extremities- no clubbing, cyanosis,  2+ pitting edema bilaterally but rt greater thanleft MS- no significant deformity or atrophy Skin- no rash or lesion Psych- euthymic mood, full affect Neuro- strength and sensation are intact  EKG-pt declined Echo-Study Conclusions  - Left ventricle: There was mild concentric hypertrophy. Systolic   function was normal. The estimated ejection fraction was in the   range of 50% to 55%. - Aortic valve: There was mild regurgitation. - Left atrium: The atrium was severely dilated. - Right ventricle: Pacer wire or catheter noted in right ventricle.   Systolic function was normal. - Right atrium: The atrium was moderately dilated. Pacer wire or   catheter noted in right atrium. - Inferior vena cava: The vessel was dilated. The respirophasic   diameter changes were blunted (< 50%), consistent with elevated   central venous pressure. - Pericardium, extracardiac: A mild pericardial effusion was   identified. Features were not consistent with tamponade   physiology.  Impressions:  - LVEF might be underestimated by a-fib with RVR during the   acquisition.   Assessment and Plan: 1. H/o paroxysmal afib , may be in persistent afib for unknown time General education and triggers discussed Discussed that he needled more rate control but pt declined Discussed at length re anticoagulation and chadsvasc score of 4, pt declined Per EPIC holter is pending  2. Weight gain/LLE Pt is currently taking 20 mg lasix daily I suggested that he may need bid until he gets back to baseline weight and LLE is improved He will take an extra tonight and discuss with Dr. Radford Pax further tomorrow   Geroge Baseman. Franko Hilliker, [facility] [address] [place], [place] [address] [phone]

## 2017-10-28 NOTE — Patient Instructions (Addendum)
Medication Instructions:  Your physician has recommended you make the following change in your medication:  STOP: aspirin  START: Eliquis 5 mg (1 tablet) two times a day   INCREASE: Lasix to 40 mg (2  Tablets) once a day for 3 days, Then TAKE 20 mg once a day. Take extra 20 mg (1 tablet) once a day as needed for edema.   If you need a refill on your cardiac medications, please contact your pharmacy first.  Labwork: Today for complete blood count and thyroid  Testing/Procedures: None ordered   Follow-Up: Your physician recommends that you schedule a follow-up appointment in: 4 weeks with Dr. Radford Pax   Any Other Special Instructions Will Be Listed Below (If Applicable). Apixaban oral tablets What is this medicine? APIXABAN (a PIX a ban) is an anticoagulant (blood thinner). It is used to lower the chance of stroke in people with a medical condition called atrial fibrillation. It is also used to treat or prevent blood clots in the lungs or in the veins. This medicine may be used for other purposes; ask your health care provider or pharmacist if you have questions. COMMON BRAND NAME(S): Eliquis What should I tell my health care provider before I take this medicine? They need to know if you have any of these conditions: -bleeding disorders -bleeding in the brain -blood in your stools (black or tarry stools) or if you have blood in your vomit -history of stomach bleeding -kidney disease -liver disease -mechanical heart valve -an unusual or allergic reaction to apixaban, other medicines, foods, dyes, or preservatives -pregnant or trying to get pregnant -breast-feeding How should I use this medicine? Take this medicine by mouth with a glass of water. Follow the directions on the prescription label. You can take it with or without food. If it upsets your stomach, take it with food. Take your medicine at regular intervals. Do not take it more often than directed. Do not stop taking except  on your doctor's advice. Stopping this medicine may increase your risk of a blot clot. Be sure to refill your prescription before you run out of medicine. Talk to your pediatrician regarding the use of this medicine in children. Special care may be needed. Overdosage: If you think you have taken too much of this medicine contact a poison control center or emergency room at once. NOTE: This medicine is only for you. Do not share this medicine with others. What if I miss a dose? If you miss a dose, take it as soon as you can. If it is almost time for your next dose, take only that dose. Do not take double or extra doses. What may interact with this medicine? This medicine may interact with the following: -aspirin and aspirin-like medicines -certain medicines for fungal infections like ketoconazole and itraconazole -certain medicines for seizures like carbamazepine and phenytoin -certain medicines that treat or prevent blood clots like warfarin, enoxaparin, and dalteparin -clarithromycin -NSAIDs, medicines for pain and inflammation, like ibuprofen or naproxen -rifampin -ritonavir -St. John's wort This list may not describe all possible interactions. Give your health care provider a list of all the medicines, herbs, non-prescription drugs, or dietary supplements you use. Also tell them if you smoke, drink alcohol, or use illegal drugs. Some items may interact with your medicine. What should I watch for while using this medicine? Visit your doctor or health care professional for regular checks on your progress. Notify your doctor or health care professional and seek emergency treatment if you develop breathing  problems; changes in vision; chest pain; severe, sudden headache; pain, swelling, warmth in the leg; trouble speaking; sudden numbness or weakness of the face, arm or leg. These can be signs that your condition has gotten worse. If you are going to have surgery or other procedure, tell your  doctor that you are taking this medicine. What side effects may I notice from receiving this medicine? Side effects that you should report to your doctor or health care professional as soon as possible: -allergic reactions like skin rash, itching or hives, swelling of the face, lips, or tongue -signs and symptoms of bleeding such as bloody or black, tarry stools; red or dark-brown urine; spitting up blood or brown material that looks like coffee grounds; red spots on the skin; unusual bruising or bleeding from the eye, gums, or nose This list may not describe all possible side effects. Call your doctor for medical advice about side effects. You may report side effects to FDA at 1-800-FDA-1088. Where should I keep my medicine? Keep out of the reach of children. Store at room temperature between 20 and 25 degrees C (68 and 77 degrees F). Throw away any unused medicine after the expiration date. NOTE: This sheet is a summary. It may not cover all possible information. If you have questions about this medicine, talk to your doctor, pharmacist, or health care provider.  2018 Elsevier/Gold Standard (2016-01-27 11:54:23)   Thank you for choosing [facility], RN  [phone]  If you need a refill on your cardiac medications before your next appointment, please call your pharmacy.

## 2017-10-29 ENCOUNTER — Telehealth: Payer: Self-pay

## 2017-10-29 LAB — CBC
Hematocrit: 38.7 % (ref 37.5–51.0)
Hemoglobin: 12.3 g/dL — ABNORMAL LOW (ref 13.0–17.7)
MCH: 29.1 pg (ref 26.6–33.0)
MCHC: 31.8 g/dL (ref 31.5–35.7)
MCV: 92 fL (ref 79–97)
Platelets: 159 10*3/uL (ref 150–379)
RBC: 4.23 x10E6/uL (ref 4.14–5.80)
RDW: 13.7 % (ref 12.3–15.4)
WBC: 6.1 10*3/uL (ref 3.4–10.8)

## 2017-10-29 LAB — TSH: TSH: 1.55 u[IU]/mL (ref 0.450–4.500)

## 2017-10-29 NOTE — Telephone Encounter (Signed)
LMOM for pt to call back about alternative. Can try Xarelto but cost likely similar given he has medicare plan. If Xarelto cost-prohibitive will need to pursue Coumadin.

## 2017-10-29 NOTE — Telephone Encounter (Addendum)
**Note De-Identified  Obfuscation** The pt is requesting an alternative to Eliquis as his co-pay for Eliquis is way more than he can afford.  I called the pts pharmacy and was advised that a PA is not required for Eliquis but that the pts co-pay is over $400 a month. Pharmacy recommends trying Xarelto.  Please advise.

## 2017-11-01 NOTE — Telephone Encounter (Signed)
Spoke with patient about alternatives. He states that he is ok with Eliquis and will consider Xarelto if cost lower and Dr. Radford Pax feels more appropriate. Advised that he call play and let us know if will be significantly cheaper. He states that he took warfarin years ago and is not interested in taking this. Advised he call if he would like to consider a change. He states understanding.

## 2017-11-05 ENCOUNTER — Telehealth: Payer: Self-pay | Admitting: Cardiology

## 2017-11-05 NOTE — Telephone Encounter (Signed)
Patient states he is working an [employer] and will be standing for an extended period of time which causes some knee pain. He states tylenol usually helps relieve some of the pain and would like to know if its okay to take. Informed patient okay to take tylenol for knee pain. Patient verbalized understanding and thankful for the call

## 2017-11-05 NOTE — Telephone Encounter (Signed)
New message   Pt c/o medication issue:  1. Name of Medication: Tylenol  2. How are you currently taking this medication (dosage and times per day)? N/A  3. Are you having a reaction (difficulty breathing--STAT)? No  4. What is your medication issue? Patient wants to know about taking medication for knee pain

## 2017-12-03 ENCOUNTER — Encounter (INDEPENDENT_AMBULATORY_CARE_PROVIDER_SITE_OTHER): Payer: Self-pay

## 2017-12-03 ENCOUNTER — Ambulatory Visit (INDEPENDENT_AMBULATORY_CARE_PROVIDER_SITE_OTHER): Payer: Medicare Other | Admitting: Cardiology

## 2017-12-03 ENCOUNTER — Encounter: Payer: Self-pay | Admitting: Cardiology

## 2017-12-03 ENCOUNTER — Telehealth: Payer: Self-pay | Admitting: *Deleted

## 2017-12-03 ENCOUNTER — Telehealth: Payer: Self-pay | Admitting: Cardiology

## 2017-12-03 VITALS — BP 132/81 | HR 109 | Ht 74.0 in | Wt 161.2 lb

## 2017-12-03 DIAGNOSIS — I5032 Chronic diastolic (congestive) heart failure: Secondary | ICD-10-CM

## 2017-12-03 DIAGNOSIS — E785 Hyperlipidemia, unspecified: Secondary | ICD-10-CM

## 2017-12-03 DIAGNOSIS — I481 Persistent atrial fibrillation: Secondary | ICD-10-CM | POA: Diagnosis not present

## 2017-12-03 DIAGNOSIS — I4819 Other persistent atrial fibrillation: Secondary | ICD-10-CM

## 2017-12-03 DIAGNOSIS — I10 Essential (primary) hypertension: Secondary | ICD-10-CM

## 2017-12-03 DIAGNOSIS — I2510 Atherosclerotic heart disease of native coronary artery without angina pectoris: Secondary | ICD-10-CM

## 2017-12-03 DIAGNOSIS — Z79899 Other long term (current) drug therapy: Secondary | ICD-10-CM

## 2017-12-03 NOTE — Progress Notes (Signed)
Cardiology Office Note:    Date:  12/03/2017    ID:  [name], DOB 1941-05-09, MRN [id number]  PCP:  Seward Carol, MD  Cardiologist:  No primary care provider on file.    Referring MD: Seward Carol, MD   Chief Complaint  Patient presents with  . Coronary Artery Disease  . Atrial Fibrillation  . Hypertension    History of Present Illness:    [name] is a 77 y.o. male with a hx of ASCADs/p BMS to the RCA, HTN,PAF with CHADS2VASC score of 4 but not on long term anticoagulation due to refusal to take because he likes to ski, dyslipidemiaand possible proarrhythmia from flecainide with VT. Diagnosed in April with acute diastolic CHF.   Echocardiogram showed low normal LV function with EF 50-55% was felt to possibly be underestimated due to atrial fibrillation.  He had mild AR, severe left atrial enlargement and a mild pericardial effusion.  He also was back in atrial fibrillation and was started on Eliquis.  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  He was referred to orthopedics and plans are for left TKR in August.   He is here for 4 week followup.  He has not had any problems with the Eliquis and has not missed any doses.  He complains of fatigue but denies any chest pain, SOB, DOE, PND, orthopnea or palpitations.  He has chronic LE edema which is improved on diuretics and compression hose.      Past Medical History:  Diagnosis Date  . CAD (coronary artery disease)    s/p bare metal stend to RCA  . Chronic diastolic CHF (congestive heart failure) ([place])   . Chronic venous insufficiency   . HTN (hypertension)   . PAF (paroxysmal atrial fibrillation) (HCC)    Not on long  term anticoagluation despite CHADS2VASC score of 4 due to patient refusal because he likes to ski and does not want to risk a fall with bleeding complications.  He understands his cardioembolic risk associated with PAF.   Marland Kitchen Pneumothorax   . Ventricular tachycardia (HCC)    possibly proarrhythmia from flecainide    Past Surgical History:  Procedure Laterality Date  . INGUINAL HERNIA REPAIR     x2    Current Medications: Current Meds  Medication Sig  . apixaban (ELIQUIS) 5 MG TABS tablet Take 1 tablet (5 mg total) by mouth 2 (two) times daily.  . Ascorbic Acid (VITAMIN C PO) Take by mouth daily.  Marland Kitchen atorvastatin (LIPITOR) 20 MG tablet TAKE ONE TABLET (20 MG) BY MOUTH DAILY.  Marland Kitchen Cholecalciferol (D-3-5) 5000 UNITS capsule Take 5,000 Units by mouth daily.  Marland Kitchen CINNAMON PO Take 500 mg by mouth 2 (two) times daily.  . cloNIDine (CATAPRES) 0.1 MG tablet Take 1 tablet (0.1 mg total) by mouth 3 (three) times daily.  . Coenzyme Q10 (CO Q 10 PO) Take 1 tablet by mouth daily.  . Cyanocobalamin (B-12) 2500 MCG TABS Take by mouth daily.  . fish oil-omega-3 fatty acids 1000 MG capsule Take 2 g by mouth daily.    . furosemide (LASIX) 20 MG tablet Take 1 tablet (20 mg total) by mouth daily. Take extra 20 mg once  a day as needed for edema  . glucosamine-chondroitin 500-400 MG tablet Take 1 tablet by mouth daily.    . Multiple Vitamin (MULTIVITAMIN) tablet Take 1 tablet by mouth daily.    . nitroGLYCERIN (NITROSTAT) 0.4 MG SL tablet Place 1 tablet (0.4 mg total) under the tongue every 5 (five) minutes as needed for chest pain.  . pindolol (VISKEN) 5 MG tablet Take 1 tablet (5 mg total) by mouth 2 (two) times daily.  . Tamsulosin HCl (FLOMAX) 0.4 MG CAPS Take 0.4 mg by mouth 2 (two) times daily.       Allergies:   Patient has no known allergies.   Social History   Socioeconomic History  . Marital status: Widowed    Spouse name: Not on file  . Number of children: Not on file  . Years of education: Not  on file  . Highest education level: Not on file  Occupational History  . Occupation: retired  Scientific laboratory technician  . Financial resource strain: Not on file  . Food insecurity:    Worry: Not on file    Inability: Not on file  . Transportation needs:    Medical: Not on file    Non-medical: Not on file  Tobacco Use  . Smoking status: Never Smoker  . Smokeless tobacco: Never Used  Substance and Sexual Activity  . Alcohol use: No    Comment: occsaoinal  . Drug use: Yes    Types: Other-see comments  . Sexual activity: Not on file  Lifestyle  . Physical activity:    Days per week: Not on file    Minutes per session: Not on file  . Stress: Not on file  Relationships  . Social connections:    Talks on phone: Not on file    Gets together: Not on file    Attends religious service: Not on file    Active member of club or organization: Not on file    Attends meetings of clubs or organizations: Not on file    Relationship status: Not on file  Other Topics Concern  . Not on file  Social History Narrative  . Not on file     Family History: The patient's family history includes Coronary artery disease in his unknown relative.  ROS:   Please see the history of present illness.    ROS  All other systems reviewed and negative.   EKGs/Labs/Other Studies Reviewed:    The following studies were reviewed today: none  EKG:  EKG is  ordered today.  The ekg ordered today demonstrates atrial fibrillation with CVR.   Recent Labs: 07/02/2017: ALT 17 10/20/2017: BUN 15; Creatinine, Ser 1.10; Potassium 4.5; Sodium 141 10/28/2017: Hemoglobin 12.3; Platelets 159; TSH 1.550   Recent Lipid Panel    Component Value Date/Time   CHOL 117 07/02/2017 0818   TRIG 39 07/02/2017 0818   HDL 58 07/02/2017 0818   CHOLHDL 2.0 07/02/2017 0818   CHOLHDL 1.9 06/19/2016 0809   VLDL 7 06/19/2016 0809   LDLCALC 51 07/02/2017 0818    Physical Exam:    VS:  BP 132/81   Pulse (!) 109   Ht 6\' 2"  (1.88 m)    Wt 161 lb 3.2 oz (73.1 kg)   BMI 20.70 kg/m     Wt Readings from Last 3 Encounters:  12/03/17 161 lb 3.2 oz (73.1 kg)  10/28/17 161 lb 12.8 oz (73.4 kg)  10/27/17 162 lb (73.5 kg)     GEN:  Well nourished, well  developed in no acute distress HEENT: Normal NECK: No JVD; No carotid bruits LYMPHATICS: No lymphadenopathy CARDIAC: irregularly irregular, no murmurs, rubs, gallops RESPIRATORY:  Clear to auscultation without rales, wheezing or rhonchi  ABDOMEN: Soft, non-tender, non-distended MUSCULOSKELETAL:  1+ pitting LE edema; No deformity  SKIN: Warm and dry NEUROLOGIC:  Alert and oriented x 3 PSYCHIATRIC:  Normal affect   ASSESSMENT:    1. Persistent atrial fibrillation ([place])   2. HYPERTENSION, BENIGN   3. Coronary artery disease involving native coronary artery of native heart without angina pectoris   4. Chronic diastolic CHF (congestive heart failure) ([place])   5. Dyslipidemia    PLAN:    In order of problems listed above:  1.  Persistent atrial fibrillation - HR is controlled on current medical therapy.  He has not missed any doses of Eliquis and EKG confirms persistence of atrial fibrillation. I will set him up for DCCV next week.  He will continue on Eliquis 5mg  BID and Pindolol 5mg  BID.    2.  HTN - BP is well controlled on exam today.  He will continue on Pindolol 5mg  BID and Clonidine 0.1mg  TID.    3.  ASCAD - s/p remote BMS to the RCA.  He denies any anginal sx.  He wil continue on BB and statin.  He is not on ASA due to DOAC.    4.  Chronic diastolic CHF - he has some mild LE edema on exam which is likely related to underlying afib.  Hopefully this will resolve once NSR has been reestablished. He will continue on lasix 20mg  daily.    5.  Hyperlipidemia with LDL goal < 70.  He will continue on atorvastatin 20mg  daily.  LDL was 51 on 07/02/2017.  Medication Adjustments/Labs and Tests Ordered: Current medicines are reviewed at length with the patient today.  Concerns  regarding medicines are outlined above.  Orders Placed This Encounter  Procedures  . EKG 12-Lead   No orders of the defined types were placed in this encounter.   Signed, Fransico Him, MD  12/03/2017 12:36 PM    [place]

## 2017-12-03 NOTE — Telephone Encounter (Signed)
New Message  Pt states he is calling about a procedure that Radford Pax wants him to have. Please call

## 2017-12-03 NOTE — Telephone Encounter (Signed)
   [facility] Pre-operative Risk Assessment    Request for surgical clearance:  1. What type of surgery is being performed? LEFT TKA-MEDIAL & LATERAL W/WO PATELLA RESURFACING  2. When is this surgery scheduled? 02/28/18   3. Are there any medications that need to be held prior to surgery and how long? ELIQUIS   4. Practice name and name of physician performing surgery? [facility]; DR. Velda [place]   5. What is your office phone and fax number? PH# [phone]; FAX # [phone]   0. Anesthesia type (None, local, MAC, general) ? ANESTHESIA CHOICE   Julaine Hua 12/03/2017, 1:46 PM  _________________________________________________________________   (provider comments below)

## 2017-12-03 NOTE — Patient Instructions (Signed)
Medication Instructions:  Your physician recommends that you continue on your current medications as directed. Please refer to the Current Medication list given to you today.  If you need a refill on your cardiac medications, please contact your pharmacy first.  Labwork: Your physician recommends that you return for lab work in: 1 week prior to your scheduled cardioversion   Testing/Procedures: Your physician has recommended that you have a Cardioversion (DCCV). Electrical Cardioversion uses a jolt of electricity to your heart either through paddles or wired patches attached to your chest. This is a controlled, usually prescheduled, procedure. Defibrillation is done under light anesthesia in the hospital, and you usually go home the day of the procedure. This is done to get your heart back into a normal rhythm. You are not awake for the procedure. Please see the instruction sheet given to you today.   Follow-Up: Your physician recommends that you schedule a follow-up appointment in: 1 month with Dr. Radford Pax   Any Other Special Instructions Will Be Listed Below (If Applicable).   Thank you for choosing [facility], RN  [phone]  If you need a refill on your cardiac medications before your next appointment, please call your pharmacy.

## 2017-12-03 NOTE — Telephone Encounter (Signed)
I spoke with patient. Patient would like to know the success rate of a cardioversion and if he would have to stay on eliquis forever. I explained to patient that per Dr. Radford Pax it's a high success rate that patients are converted to a normal rhythm and due to chadsvasc score would recommend continuing Eliquis. Patient stated understanding. He states he would speak with his daughter and call back to schedule cardioversion.

## 2017-12-03 NOTE — H&P (View-Only) (Signed)
Cardiology Office Note:    Date:  12/03/2017    ID:  [name], DOB 1941-05-27, MRN [id number]  PCP:  Seward Carol, MD  Cardiologist:  No primary care provider on file.    Referring MD: Seward Carol, MD   Chief Complaint  Patient presents with  . Coronary Artery Disease  . Atrial Fibrillation  . Hypertension    History of Present Illness:    [name] is a 77 y.o. male with a hx of ASCADs/p BMS to the RCA, HTN,PAF with CHADS2VASC score of 4 but not on long term anticoagulation due to refusal to take because he likes to ski, dyslipidemiaand possible proarrhythmia from flecainide with VT. Diagnosed in April with acute diastolic CHF.   Echocardiogram showed low normal LV function with EF 50-55% was felt to possibly be underestimated due to atrial fibrillation.  He had mild AR, severe left atrial enlargement and a mild pericardial effusion.  He also was back in atrial fibrillation and was started on Eliquis.  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  He was referred to orthopedics and plans are for left TKR in August.   He is here for 4 week followup.  He has not had any problems with the Eliquis and has not missed any doses.  He complains of fatigue but denies any chest pain, SOB, DOE, PND, orthopnea or palpitations.  He has chronic LE edema which is improved on diuretics and compression hose.      Past Medical History:  Diagnosis Date  . CAD (coronary artery disease)    s/p bare metal stend to RCA  . Chronic diastolic CHF (congestive heart failure) ([place])   . Chronic venous insufficiency   . HTN (hypertension)   . PAF (paroxysmal atrial fibrillation) (HCC)    Not on long  term anticoagluation despite CHADS2VASC score of 4 due to patient refusal because he likes to ski and does not want to risk a fall with bleeding complications.  He understands his cardioembolic risk associated with PAF.   Marland Kitchen Pneumothorax   . Ventricular tachycardia (HCC)    possibly proarrhythmia from flecainide    Past Surgical History:  Procedure Laterality Date  . INGUINAL HERNIA REPAIR     x2    Current Medications: Current Meds  Medication Sig  . apixaban (ELIQUIS) 5 MG TABS tablet Take 1 tablet (5 mg total) by mouth 2 (two) times daily.  . Ascorbic Acid (VITAMIN C PO) Take by mouth daily.  Marland Kitchen atorvastatin (LIPITOR) 20 MG tablet TAKE ONE TABLET (20 MG) BY MOUTH DAILY.  Marland Kitchen Cholecalciferol (D-3-5) 5000 UNITS capsule Take 5,000 Units by mouth daily.  Marland Kitchen CINNAMON PO Take 500 mg by mouth 2 (two) times daily.  . cloNIDine (CATAPRES) 0.1 MG tablet Take 1 tablet (0.1 mg total) by mouth 3 (three) times daily.  . Coenzyme Q10 (CO Q 10 PO) Take 1 tablet by mouth daily.  . Cyanocobalamin (B-12) 2500 MCG TABS Take by mouth daily.  . fish oil-omega-3 fatty acids 1000 MG capsule Take 2 g by mouth daily.    . furosemide (LASIX) 20 MG tablet Take 1 tablet (20 mg total) by mouth daily. Take extra 20 mg once  a day as needed for edema  . glucosamine-chondroitin 500-400 MG tablet Take 1 tablet by mouth daily.    . Multiple Vitamin (MULTIVITAMIN) tablet Take 1 tablet by mouth daily.    . nitroGLYCERIN (NITROSTAT) 0.4 MG SL tablet Place 1 tablet (0.4 mg total) under the tongue every 5 (five) minutes as needed for chest pain.  . pindolol (VISKEN) 5 MG tablet Take 1 tablet (5 mg total) by mouth 2 (two) times daily.  . Tamsulosin HCl (FLOMAX) 0.4 MG CAPS Take 0.4 mg by mouth 2 (two) times daily.       Allergies:   Patient has no known allergies.   Social History   Socioeconomic History  . Marital status: Widowed    Spouse name: Not on file  . Number of children: Not on file  . Years of education: Not  on file  . Highest education level: Not on file  Occupational History  . Occupation: retired  Scientific laboratory technician  . Financial resource strain: Not on file  . Food insecurity:    Worry: Not on file    Inability: Not on file  . Transportation needs:    Medical: Not on file    Non-medical: Not on file  Tobacco Use  . Smoking status: Never Smoker  . Smokeless tobacco: Never Used  Substance and Sexual Activity  . Alcohol use: No    Comment: occsaoinal  . Drug use: Yes    Types: Other-see comments  . Sexual activity: Not on file  Lifestyle  . Physical activity:    Days per week: Not on file    Minutes per session: Not on file  . Stress: Not on file  Relationships  . Social connections:    Talks on phone: Not on file    Gets together: Not on file    Attends religious service: Not on file    Active member of club or organization: Not on file    Attends meetings of clubs or organizations: Not on file    Relationship status: Not on file  Other Topics Concern  . Not on file  Social History Narrative  . Not on file     Family History: The patient's family history includes Coronary artery disease in his unknown relative.  ROS:   Please see the history of present illness.    ROS  All other systems reviewed and negative.   EKGs/Labs/Other Studies Reviewed:    The following studies were reviewed today: none  EKG:  EKG is  ordered today.  The ekg ordered today demonstrates atrial fibrillation with CVR.   Recent Labs: 07/02/2017: ALT 17 10/20/2017: BUN 15; Creatinine, Ser 1.10; Potassium 4.5; Sodium 141 10/28/2017: Hemoglobin 12.3; Platelets 159; TSH 1.550   Recent Lipid Panel    Component Value Date/Time   CHOL 117 07/02/2017 0818   TRIG 39 07/02/2017 0818   HDL 58 07/02/2017 0818   CHOLHDL 2.0 07/02/2017 0818   CHOLHDL 1.9 06/19/2016 0809   VLDL 7 06/19/2016 0809   LDLCALC 51 07/02/2017 0818    Physical Exam:    VS:  BP 132/81   Pulse (!) 109   Ht 6\' 2"  (1.88 m)    Wt 161 lb 3.2 oz (73.1 kg)   BMI 20.70 kg/m     Wt Readings from Last 3 Encounters:  12/03/17 161 lb 3.2 oz (73.1 kg)  10/28/17 161 lb 12.8 oz (73.4 kg)  10/27/17 162 lb (73.5 kg)     GEN:  Well nourished, well  developed in no acute distress HEENT: Normal NECK: No JVD; No carotid bruits LYMPHATICS: No lymphadenopathy CARDIAC: irregularly irregular, no murmurs, rubs, gallops RESPIRATORY:  Clear to auscultation without rales, wheezing or rhonchi  ABDOMEN: Soft, non-tender, non-distended MUSCULOSKELETAL:  1+ pitting LE edema; No deformity  SKIN: Warm and dry NEUROLOGIC:  Alert and oriented x 3 PSYCHIATRIC:  Normal affect   ASSESSMENT:    1. Persistent atrial fibrillation ([place])   2. HYPERTENSION, BENIGN   3. Coronary artery disease involving native coronary artery of native heart without angina pectoris   4. Chronic diastolic CHF (congestive heart failure) ([place])   5. Dyslipidemia    PLAN:    In order of problems listed above:  1.  Persistent atrial fibrillation - HR is controlled on current medical therapy.  He has not missed any doses of Eliquis and EKG confirms persistence of atrial fibrillation. I will set him up for DCCV next week.  He will continue on Eliquis 5mg  BID and Pindolol 5mg  BID.    2.  HTN - BP is well controlled on exam today.  He will continue on Pindolol 5mg  BID and Clonidine 0.1mg  TID.    3.  ASCAD - s/p remote BMS to the RCA.  He denies any anginal sx.  He wil continue on BB and statin.  He is not on ASA due to DOAC.    4.  Chronic diastolic CHF - he has some mild LE edema on exam which is likely related to underlying afib.  Hopefully this will resolve once NSR has been reestablished. He will continue on lasix 20mg  daily.    5.  Hyperlipidemia with LDL goal < 70.  He will continue on atorvastatin 20mg  daily.  LDL was 51 on 07/02/2017.  Medication Adjustments/Labs and Tests Ordered: Current medicines are reviewed at length with the patient today.  Concerns  regarding medicines are outlined above.  Orders Placed This Encounter  Procedures  . EKG 12-Lead   No orders of the defined types were placed in this encounter.   Signed, Fransico Him, MD  12/03/2017 12:36 PM    [place]

## 2017-12-07 NOTE — Telephone Encounter (Signed)
Left message with Katharine Look, daughter to call back to schedule cardioversion and BMET

## 2017-12-07 NOTE — Telephone Encounter (Signed)
Follow up    Patient calling to request a call be made to his daughter Claudie Fisherman to discuss care plan. Katharine Look [phone]

## 2017-12-07 NOTE — Telephone Encounter (Signed)
Pt calling and stated he need Dr Radford Pax to call his daughter Katharine Look at [phone] and not the nurse to call his daughter,concerning his heart function and the procedure he is having to schedule.Marland Kitchen

## 2017-12-08 NOTE — Telephone Encounter (Signed)
   Primary Cardiologist:Traci Turner, MD  Chart reviewed as part of pre-operative protocol coverage. Last seen by Dr. Radford Pax 12/03/17. Plan for DCCV in upcoming weeks. Will review clearance when patient comes for follow up.  Pre-op covering staff: - Please contact requesting surgeon's office via preferred method (i.e, phone, fax) to inform them of need for cardioversion  appointment prior to surgery.  [place], [place]  12/08/2017, 2:03 PM

## 2017-12-08 NOTE — Telephone Encounter (Signed)
He will not be able to be cleared until he is 4 weeks out for DCCV.  Please set up appt with me 4 weeks after DCCV

## 2017-12-08 NOTE — Telephone Encounter (Signed)
DCCV needs to be scheduled.

## 2017-12-08 NOTE — Telephone Encounter (Signed)
   I will route this recommendation to the requesting party via Epic fax function and remove from pre-op pool.  Please call with questions.  [place], [place] 12/08/2017, 4:24 PM

## 2017-12-08 NOTE — Telephone Encounter (Signed)
Left message with daughter per patient request to call back to set up patient for DCCV.

## 2017-12-08 NOTE — Telephone Encounter (Addendum)
Will send message to Dr. Theodosia Blender nurse to see when patient will have f/u appt and cardioversion, so he can be cleared for surgery.

## 2017-12-09 ENCOUNTER — Telehealth: Payer: Self-pay | Admitting: Cardiology

## 2017-12-09 NOTE — Telephone Encounter (Signed)
DCCV scheduled for 12/23/17 pending approval by Dr. Radford Pax   See phone note 12/09/17

## 2017-12-09 NOTE — Telephone Encounter (Signed)
Received a call from [place], patient's daughter. She states she is out of town right now and patient has no one to transport home after DCCV. She states she will be back in 2 weeks. She also requested that DCCV be performed by Dr. Radford Pax. Scheduled patient for DCCV on 12/23/17 at 12PM with Dr. Radford Pax. I informed her I would make Dr. Radford Pax aware. She stated understanding and thankful for the call.   Patient informed he will need BMET on Monday 12/20/17.

## 2017-12-09 NOTE — Telephone Encounter (Signed)
I spoke with patient. Reviewed cardioversion instructions with patient. Informed patient that the procedure will done a short stay. Instructions mailed to patient's home address. Patient stated understanding and thankful for the call

## 2017-12-09 NOTE — Telephone Encounter (Signed)
Per Dr. Radford Pax okay for patient to proceed with DCCV on 12/23/17. CASE no. [id number]. Patient and [name] made aware. Patient to schedule a 4 week f/u with Dr. Radford Pax after DCCV. [name] stated she will speak with pt and call back to schedule 4 week f/u/ She stated understanding and thankful for the call

## 2017-12-09 NOTE — Telephone Encounter (Signed)
New Message:      Pt is calling with some questions about a upcoming procedure.

## 2017-12-15 NOTE — Telephone Encounter (Signed)
**Note De-Identified  Obfuscation** As of today the pt has not picked up the Eliquis samples that were placed in the front office awaiting his pick up. I have placed Eliquis samples back in samples closet.

## 2017-12-20 ENCOUNTER — Other Ambulatory Visit: Payer: Medicare Other | Admitting: *Deleted

## 2017-12-20 DIAGNOSIS — Z79899 Other long term (current) drug therapy: Secondary | ICD-10-CM

## 2017-12-21 LAB — BASIC METABOLIC PANEL
BUN/Creatinine Ratio: 14 (ref 10–24)
BUN: 15 mg/dL (ref 8–27)
CALCIUM: 9.2 mg/dL (ref 8.6–10.2)
CO2: 28 mmol/L (ref 20–29)
CREATININE: 1.09 mg/dL (ref 0.76–1.27)
Chloride: 103 mmol/L (ref 96–106)
GFR calc Af Amer: 75 mL/min/{1.73_m2} (ref >59)
GFR, EST NON AFRICAN AMERICAN: 65 mL/min/{1.73_m2} (ref >59)
Glucose: 97 mg/dL (ref 65–99)
Potassium: 4.1 mmol/L (ref 3.5–5.2)
Sodium: 143 mmol/L (ref 134–144)

## 2017-12-22 ENCOUNTER — Other Ambulatory Visit: Payer: Self-pay | Admitting: Cardiology

## 2017-12-22 ENCOUNTER — Telehealth: Payer: Self-pay | Admitting: Cardiology

## 2017-12-22 NOTE — Telephone Encounter (Signed)
Pt asking if he would be able to eat after the cardioversion. I informed pt once he is stable he will be discharged and allowed to eat. He stated understanding and thankful for the call

## 2017-12-22 NOTE — Telephone Encounter (Signed)
New message     Patient wants you to call him about tomorrow

## 2017-12-23 ENCOUNTER — Ambulatory Visit (HOSPITAL_COMMUNITY): Payer: Medicare Other | Admitting: Anesthesiology

## 2017-12-23 ENCOUNTER — Encounter (HOSPITAL_COMMUNITY)
Admission: RE | Disposition: A | Discharge status: Home / Self Care | Payer: Self-pay | Source: Ambulatory Visit | Attending: Cardiology

## 2017-12-23 ENCOUNTER — Telehealth: Payer: Self-pay | Admitting: Cardiology

## 2017-12-23 ENCOUNTER — Ambulatory Visit (HOSPITAL_COMMUNITY)
Admission: RE | Admit: 2017-12-23 | Discharge: 2017-12-23 | Disposition: A | Discharge status: Home / Self Care | Payer: Medicare Other | Source: Ambulatory Visit | Attending: Cardiology | Admitting: Cardiology

## 2017-12-23 ENCOUNTER — Encounter (HOSPITAL_COMMUNITY): Payer: Self-pay | Admitting: *Deleted

## 2017-12-23 DIAGNOSIS — I5032 Chronic diastolic (congestive) heart failure: Secondary | ICD-10-CM | POA: Diagnosis not present

## 2017-12-23 DIAGNOSIS — I110 Hypertensive heart disease with heart failure: Secondary | ICD-10-CM | POA: Diagnosis not present

## 2017-12-23 DIAGNOSIS — Z8249 Family history of ischemic heart disease and other diseases of the circulatory system: Secondary | ICD-10-CM | POA: Diagnosis not present

## 2017-12-23 DIAGNOSIS — E785 Hyperlipidemia, unspecified: Secondary | ICD-10-CM | POA: Insufficient documentation

## 2017-12-23 DIAGNOSIS — I2510 Atherosclerotic heart disease of native coronary artery without angina pectoris: Secondary | ICD-10-CM | POA: Diagnosis not present

## 2017-12-23 DIAGNOSIS — Z7901 Long term (current) use of anticoagulants: Secondary | ICD-10-CM | POA: Diagnosis not present

## 2017-12-23 DIAGNOSIS — I481 Persistent atrial fibrillation: Secondary | ICD-10-CM | POA: Insufficient documentation

## 2017-12-23 DIAGNOSIS — I739 Peripheral vascular disease, unspecified: Secondary | ICD-10-CM | POA: Diagnosis not present

## 2017-12-23 HISTORY — PX: CARDIOVERSION: SHX1299

## 2017-12-23 SURGERY — CARDIOVERSION
Anesthesia: General

## 2017-12-23 MED ORDER — PROPOFOL 10 MG/ML IV BOLUS
INTRAVENOUS | Status: DC | PRN
  Administered 2017-12-23: 70 mg via INTRAVENOUS

## 2017-12-23 MED ORDER — CLONIDINE HCL 0.1 MG PO TABS: 0.1000 mg | ORAL_TABLET | Freq: Two times a day (BID) | ORAL | Status: DC

## 2017-12-23 MED ORDER — LIDOCAINE 2% (20 MG/ML) 5 ML SYRINGE
INTRAMUSCULAR | Status: DC | PRN
  Administered 2017-12-23: 100 mg via INTRAVENOUS

## 2017-12-23 MED ORDER — SODIUM CHLORIDE 0.9 % IV SOLN
INTRAVENOUS | Status: DC
  Administered 2017-12-23: 11:00:00 via INTRAVENOUS

## 2017-12-23 NOTE — Interval H&P Note (Signed)
History and Physical Interval Note:  12/23/2017 12:01 PM  [name]  has presented today for surgery, with the diagnosis of afib  The various methods of treatment have been discussed with the patient and family. After consideration of risks, benefits and other options for treatment, the patient has consented to  Procedure(s): CARDIOVERSION (N/A) as a surgical intervention .  The patient's history has been reviewed, patient examined, no change in status, stable for surgery.  I have reviewed the patient's chart and labs.  Questions were answered to the patient's satisfaction.     Fransico Him

## 2017-12-23 NOTE — Telephone Encounter (Signed)
New Message   Pt's daughter is calling to set up an appt for a cardio version recheck, gave the soonest with a PA 6/17 but pt wants to make sure that's an ok date for Dr. Radford Pax. Please call

## 2017-12-23 NOTE — CV Procedure (Signed)
   Electrical Cardioversion Procedure Note [name] [id number] 1941/03/03  Procedure: Electrical Cardioversion Indications:  Atrial Fibrillation  Time Out: Verified patient identification, verified procedure,medications/allergies/relevent history reviewed, required imaging and test results available.  Performed  Procedure Details  The patient was NPO after midnight. Anesthesia was administered at the beside  by Dr.Germeroth with 70mg  of propofol and 100mg  of Lidocaine.  Cardioversion was done with synchronized biphasic defibrillation with AP pads with 150watts.  The patient did not convert to normal sinus rhythm. Cardioversion was done with synchronized biphasic defibrillation with AP pads with 200watts.  The patient did not convert to normal sinus rhythm.  Repeat cardioversion was done with synchronized biphasic defibrillation with AP pads with 200watts and successfully converted patient to NSR with PACs.  The patient tolerated the procedure well   IMPRESSION:  Successful cardioversion of atrial fibrillation    [name] 12/23/2017, 11:57 AM

## 2017-12-23 NOTE — Telephone Encounter (Addendum)
Dr. Radford Pax and Mearl Latin, is it ok for the pt to be seen on 02/01/18, for post cardioversion follow-up? Scheduling put him there. Pt wanted to clarify with you, on the timing of this appt. This is the next available date with an APP, per scheduling.  You have an opening on 6/17, would you like the pt to be scheduled for then, or proceed with already scheduled appt with Ellen Henri PA-C for 02/01/18? Please advise.

## 2017-12-23 NOTE — Discharge Instructions (Signed)
Electrical Cardioversion, Care After °This sheet gives you information about how to care for yourself after your procedure. Your health care provider may also give you more specific instructions. If you have problems or questions, contact your health care provider. °What can I expect after the procedure? °After the procedure, it is common to have: °· Some redness on the skin where the shocks were given. ° °Follow these instructions at home: °· Do not drive for 24 hours if you were given a medicine to help you relax (sedative). °· Take over-the-counter and prescription medicines only as told by your health care provider. °· Ask your health care provider how to check your pulse. Check it often. °· Rest for 48 hours after the procedure or as told by your health care provider. °· Avoid or limit your caffeine use as told by your health care provider. °Contact a health care provider if: °· You feel like your heart is beating too quickly or your pulse is not regular. °· You have a serious muscle cramp that does not go away. °Get help right away if: °· You have discomfort in your chest. °· You are dizzy or you feel faint. °· You have trouble breathing or you are short of breath. °· Your speech is slurred. °· You have trouble moving an arm or leg on one side of your body. °· Your fingers or toes turn cold or blue. °This information is not intended to replace advice given to you by your health care provider. Make sure you discuss any questions you have with your health care provider. °Document Released: 04/26/2013 Document Revised: 02/07/2016 Document Reviewed: 01/10/2016 °Elsevier Interactive Patient Education © 2018 [employer] ° °

## 2017-12-23 NOTE — Transfer of Care (Signed)
Immediate Anesthesia Transfer of Care Note  Patient: [name]  Procedure(s) Performed: CARDIOVERSION (N/A )  Patient Location: Endoscopy Unit  Anesthesia Type:MAC  Level of Consciousness: drowsy and patient cooperative  Airway & Oxygen Therapy: Patient Spontanous Breathing  Post-op Assessment: Report given to RN and Post -op Vital signs reviewed and stable  Post vital signs: Reviewed and stable  Last Vitals:  Vitals Value Taken Time  BP 148/82 12/23/2017 12:30 PM  Temp    Pulse 63 12/23/2017 12:30 PM  Resp 16 12/23/2017 12:30 PM  SpO2 99 % 12/23/2017 12:30 PM  Vitals shown include unvalidated device data.  Last Pain:  Vitals:   12/23/17 1230  TempSrc:   PainSc: 0-No pain         Complications: No apparent anesthesia complications

## 2017-12-23 NOTE — Anesthesia Preprocedure Evaluation (Signed)
Anesthesia Evaluation  Patient identified by MRN, date of birth, ID band Patient awake    Reviewed: Allergy & Precautions, NPO status , Patient's Chart, lab work & pertinent test results  Airway Mallampati: II  TM Distance: >3 FB Neck ROM: Full    Dental no notable dental hx.    Pulmonary neg pulmonary ROS,    Pulmonary exam normal breath sounds clear to auscultation       Cardiovascular hypertension, + CAD, + Peripheral Vascular Disease and +CHF  Normal cardiovascular exam Rhythm:Regular Rate:Normal  Echo 10/2017 - Left ventricle: There was mild concentric hypertrophy. Systolic function was normal. The estimated ejection fraction was in the range of 50% to 55%. - Aortic valve: There was mild regurgitation. - Left atrium: The atrium was severely dilated. - Right ventricle: Pacer wire or catheter noted in right ventricle. Systolic function was normal. - Right atrium: The atrium was moderately dilated. Pacer wire or catheter noted in right atrium. - Inferior vena cava: The vessel was dilated. The respirophasic diameter changes were blunted (< 50%), consistent with elevated central venous pressure. - Pericardium, extracardiac: A mild pericardial effusion was identified. Features were not consistent with tamponade physiology.  Impressions: - LVEF might be underestimated by a-fib with RVR during the acquisition.   Neuro/Psych negative neurological ROS  negative psych ROS   GI/Hepatic negative GI ROS, Neg liver ROS,   Endo/Other  negative endocrine ROS  Renal/GU negative Renal ROS     Musculoskeletal negative musculoskeletal ROS (+)   Abdominal   Peds  Hematology negative hematology ROS (+)   Anesthesia Other Findings   Reproductive/Obstetrics negative OB ROS                             Anesthesia Physical Anesthesia Plan  ASA: III  Anesthesia Plan: General   Post-op Pain Management:     Induction: Intravenous  PONV Risk Score and Plan: 2 and Propofol infusion and Treatment may vary due to age or medical condition  Airway Management Planned: Mask  Additional Equipment:   Intra-op Plan:   Post-operative Plan:   Informed Consent: I have reviewed the patients History and Physical, chart, labs and discussed the procedure including the risks, benefits and alternatives for the proposed anesthesia with the patient or authorized representative who has indicated his/her understanding and acceptance.   Dental advisory given  Plan Discussed with: CRNA  Anesthesia Plan Comments:         Anesthesia Quick Evaluation

## 2017-12-24 NOTE — Anesthesia Postprocedure Evaluation (Signed)
Anesthesia Post Note  Patient: [name]  Procedure(s) Performed: CARDIOVERSION (N/A )     Patient location during evaluation: PACU Anesthesia Type: General Level of consciousness: sedated and patient cooperative Pain management: pain level controlled Vital Signs Assessment: post-procedure vital signs reviewed and stable Respiratory status: spontaneous breathing Cardiovascular status: stable Anesthetic complications: no    Last Vitals:  Vitals:   12/23/17 1220 12/23/17 1230  BP: 136/85 (!) 148/82  Pulse: 72 66  Resp: 19 (!) 21  Temp:    SpO2: 100% 97%    Last Pain:  Vitals:   12/23/17 1230  TempSrc:   PainSc: 0-No pain                 Nolon Nations

## 2017-12-25 NOTE — Telephone Encounter (Signed)
6-17 is fine.

## 2017-12-27 NOTE — Telephone Encounter (Signed)
Pt scheduled with Dr. Radford Pax on 12/24/17 at 2 PM. Left message informing pt of changes and informed pt call call back with any questions or concerns.

## 2018-01-03 ENCOUNTER — Encounter: Payer: Self-pay | Admitting: Cardiology

## 2018-01-03 ENCOUNTER — Ambulatory Visit (INDEPENDENT_AMBULATORY_CARE_PROVIDER_SITE_OTHER): Payer: Medicare Other | Admitting: Cardiology

## 2018-01-03 VITALS — BP 140/73 | HR 69 | Ht 74.0 in | Wt 160.0 lb

## 2018-01-03 DIAGNOSIS — I5032 Chronic diastolic (congestive) heart failure: Secondary | ICD-10-CM | POA: Diagnosis not present

## 2018-01-03 DIAGNOSIS — I481 Persistent atrial fibrillation: Secondary | ICD-10-CM

## 2018-01-03 DIAGNOSIS — I4819 Other persistent atrial fibrillation: Secondary | ICD-10-CM

## 2018-01-03 DIAGNOSIS — I10 Essential (primary) hypertension: Secondary | ICD-10-CM

## 2018-01-03 DIAGNOSIS — E785 Hyperlipidemia, unspecified: Secondary | ICD-10-CM

## 2018-01-03 DIAGNOSIS — I2510 Atherosclerotic heart disease of native coronary artery without angina pectoris: Secondary | ICD-10-CM | POA: Diagnosis not present

## 2018-01-03 MED ORDER — PINDOLOL 10 MG PO TABS: 10.0000 mg | ORAL_TABLET | Freq: Two times a day (BID) | ORAL | 3 refills | Status: DC

## 2018-01-03 NOTE — Progress Notes (Signed)
Cardiology Office Note:    Date:  01/03/2018   ID:  [name], DOB Jun 27, 1941, MRN [id number]  PCP:  Jesse Carol, MD  Cardiologist:  Jesse Him, MD    Referring MD: Jesse Carol, MD   Chief Complaint  Patient presents with  . Atrial Fibrillation  . Coronary Artery Disease  . Hypertension    History of Present Illness:    [name] is a 77 y.o. 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 Diagnosed in April with acute diastolic CHF.  Echocardiogram showed low normal LV function with EF 50-55% was felt to possibly be underestimated due to atrial fibrillation. He had mild AR, severe left atrial enlargement and a mild pericardial effusion. He also was back in atrial fibrillation and was started on Eliquis. 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 He was referred to orthopedics and plans are for left TKR in August.   I saw Reyes back in May and he remained in atrial fibrillation with controlled ventricular response.  He had not missed any doses of his Eliquis for over 4 weeks and he was set up for outpatient cardioversion.  He underwent cardioversion on 12/23/2017 to normal sinus rhythm.  He is now here for follow-up.  He is here today for followup and is doing well.  He denies any chest pain or pressure, SOB, DOE, PND, orthopnea, LE edema, dizziness, palpitations or syncope. He is compliant with his meds and is tolerating meds with no SE.      Past Medical History:  Diagnosis Date  . CAD (coronary artery disease)    s/p bare metal stend to RCA  . Chronic diastolic CHF  (congestive heart failure) ([place])   . Chronic venous insufficiency   . HTN (hypertension)   . PAF (paroxysmal atrial fibrillation) ([facility])    Not on long term anticoagluation despite CHADS2VASC score of 4 due to patient refusal because he likes to ski and does not want to risk a fall with bleeding complications.  He understands his cardioembolic risk associated with PAF.   Marland Kitchen Pneumothorax   . Ventricular tachycardia ([facility])    possibly proarrhythmia from flecainide    Past Surgical History:  Procedure Laterality Date  . CARDIOVERSION N/A 12/23/2017   Procedure: CARDIOVERSION;  Surgeon: Sueanne Margarita, MD;  Location: [facility];  Service: Cardiovascular;  Laterality: N/A;  . INGUINAL HERNIA REPAIR     x2    Current Medications: Current Meds  Medication Sig  . apixaban (ELIQUIS) 5 MG TABS tablet Take 1 tablet (5 mg total) by mouth 2 (two) times daily.  . Ascorbic Acid (VITAMIN C PO) Take 1 tablet by mouth daily.   Marland Kitchen atorvastatin (LIPITOR) 20 MG tablet TAKE ONE TABLET (20 MG) BY MOUTH DAILY.  Marland Kitchen Cholecalciferol (D-3-5) 5000 UNITS capsule Take 5,000 Units by mouth daily.  Marland Kitchen CINNAMON PO Take 500 mg by mouth 2 (two) times daily.  . cloNIDine (CATAPRES) 0.1 MG tablet Take 1 tablet (0.1 mg total) by mouth 2 (two) times daily.  . Coenzyme Q10 (CO Q 10 PO) Take 1 tablet by mouth daily.  . Cyanocobalamin (  B-12) 2500 MCG TABS Take by mouth daily.  . fish oil-omega-3 fatty acids 1000 MG capsule Take 2 g by mouth daily.    . furosemide (LASIX) 20 MG tablet Take 1 tablet (20 mg total) by mouth daily. Take extra 20 mg once a day as needed for edema  . glucosamine-chondroitin 500-400 MG tablet Take 1 tablet by mouth daily.    . Multiple Vitamin (MULTIVITAMIN) tablet Take 1 tablet by mouth daily.    . nitroGLYCERIN (NITROSTAT) 0.4 MG SL tablet Place 1 tablet (0.4 mg total) under the tongue every 5 (five) minutes as needed for chest pain.  . pindolol (VISKEN) 5 MG tablet Take 1 tablet (5 mg total) by mouth  2 (two) times daily.  . Tamsulosin HCl (FLOMAX) 0.4 MG CAPS Take 0.4 mg by mouth 2 (two) times daily.       Allergies:   Patient has no known allergies.   Social History   Socioeconomic History  . Marital status: Widowed    Spouse name: Not on file  . Number of children: Not on file  . Years of education: Not on file  . Highest education level: Not on file  Occupational History  . Occupation: retired  Scientific laboratory technician  . Financial resource strain: Not on file  . Food insecurity:    Worry: Not on file    Inability: Not on file  . Transportation needs:    Medical: Not on file    Non-medical: Not on file  Tobacco Use  . Smoking status: Never Smoker  . Smokeless tobacco: Never Used  Substance and Sexual Activity  . Alcohol use: No    Comment: occsaoinal  . Drug use: Yes    Types: Other-see comments  . Sexual activity: Not on file  Lifestyle  . Physical activity:    Days per week: Not on file    Minutes per session: Not on file  . Stress: Not on file  Relationships  . Social connections:    Talks on phone: Not on file    Gets together: Not on file    Attends religious service: Not on file    Active member of club or organization: Not on file    Attends meetings of clubs or organizations: Not on file    Relationship status: Not on file  Other Topics Concern  . Not on file  Social History Narrative  . Not on file     Family History: The patient's family history includes Coronary artery disease in his unknown relative.  ROS:   Please see the history of present illness.    ROS  All other systems reviewed and negative.   EKGs/Labs/Other Studies Reviewed:    The following studies were reviewed today: none  EKG:  EKG is  ordered today.  The ekg ordered today demonstrates normal sinus rhythm with occasional PACs and atrial couplets.  Recent Labs: 07/02/2017: ALT 17 10/28/2017: Hemoglobin 12.3; Platelets 159; TSH 1.550 12/20/2017: BUN 15; Creatinine, Ser 1.09;  Potassium 4.1; Sodium 143   Recent Lipid Panel    Component Value Date/Time   CHOL 117 07/02/2017 0818   TRIG 39 07/02/2017 0818   HDL 58 07/02/2017 0818   CHOLHDL 2.0 07/02/2017 0818   CHOLHDL 1.9 06/19/2016 0809   VLDL 7 06/19/2016 0809   LDLCALC 51 07/02/2017 0818    Physical Exam:    VS:  BP 140/73   Pulse 69   Ht 6\' 2"  (1.88 m)   Wt 160 lb (72.6  kg)   BMI 20.54 kg/m     Wt Readings from Last 3 Encounters:  01/03/18 160 lb (72.6 kg)  12/23/17 161 lb (73 kg)  12/03/17 161 lb 3.2 oz (73.1 kg)     GEN:  Well nourished, well developed in no acute distress HEENT: Normal NECK: No JVD; No carotid bruits LYMPHATICS: No lymphadenopathy CARDIAC: RRR, no murmurs, rubs, gallops and frequent ectopy RESPIRATORY:  Clear to auscultation without rales, wheezing or rhonchi  ABDOMEN: Soft, non-tender, non-distended MUSCULOSKELETAL:  No edema; No deformity  SKIN: Warm and dry NEUROLOGIC:  Alert and oriented x 3 PSYCHIATRIC:  Normal affect   ASSESSMENT:    1. Persistent atrial fibrillation ([place])   2. Coronary artery disease involving native coronary artery of native heart without angina pectoris   3. Chronic diastolic CHF (congestive heart failure) ([place])   4. HYPERTENSION, BENIGN   5. Dyslipidemia    PLAN:    In order of problems listed above:  1.  Persistent atrial fibrillation -status post cardioversion to normal sinus rhythm.  He appears to be maintaining sinus rhythm on exam today but does appear to have short runs of tachycardia that on EKG appear to be frequent PACs, atrial couplets and nonsustained atrial tachycardia..  I have instructed Reyes to increase pindolol to 10 mg twice daily to try to suppress the PACs prevent recurrence of atrial fibrillation and continue Eliquis 5 mg twice daily.  Creatinine was stable at 1.09 on 12/20/2017 and hemoglobin stable at 12.3 on 10/28/2017.  2.  ASCHD - s/p BMS to the RCA.  He denies any anginal symptoms.  He will continue on statin  therapy.  He is not on aspirin due to Eliquis.  3.  Chronic diastolic CHF -he appears euvolemic on exam today.  He will continue on Lasix 20 mg daily.  4.  Hypertension -BP appears well controlled today.  Since I am increasing his pindolol to 10 mg twice daily and blood pressure is well controlled I will actually have Reyes stop his clonidine.  We will do this slowly over a taper.  I instructed Reyes to decrease his clonidine to 0.1 mg daily for 2 days then every other day for 2 days and then stop.  I have asked Reyes to check his blood pressure and heart rate daily for a week and call me with the results.  5.  Hyperlipidemia with LDL goal less than 70 -he will continue on atorvastatin 20 mg daily.  LDL was at goal at 51 on 07/02/2017.   Medication Adjustments/Labs and Tests Ordered: Current medicines are reviewed at length with the patient today.  Concerns regarding medicines are outlined above.  Orders Placed This Encounter  Procedures  . EKG 12-Lead   No orders of the defined types were placed in this encounter.   Signed, Jesse Him, MD  01/03/2018 2:15 PM    [facility]

## 2018-01-03 NOTE — Patient Instructions (Signed)
Medication Instructions:  Your physician has recommended you make the following change in your medication:  INCREASE: Pindolol to 10 mg two times a day   You physician would like to gradually stop your Clonidine:  take 1 tablet once a day for 2 days, then take 1 tablet every other day for 2 days, Then discontinue completely    Monitor and record blood pressure and heart rate daily for the next week and call the office next with blood pressure and heart rate readings.   If you need a refill on your cardiac medications, please contact your pharmacy first.  Labwork: None ordered   Testing/Procedures: None ordered   Follow-Up: Your physician recommends that you schedule a follow-up appointment in: 4 months with Dr. Radford Pax   Any Other Special Instructions Will Be Listed Below (If Applicable).   Thank you for choosing [facility], RN  [phone]  If you need a refill on your cardiac medications before your next appointment, please call your pharmacy.

## 2018-01-10 ENCOUNTER — Telehealth: Payer: Self-pay | Admitting: Cardiology

## 2018-01-10 NOTE — Telephone Encounter (Signed)
New Message   Pt calling to speak with nurse, hung up without giving a reason. Please call

## 2018-01-11 NOTE — Telephone Encounter (Signed)
Left message to call back  

## 2018-01-11 NOTE — Telephone Encounter (Signed)
New Message: ° ° ° ° ° ° °Pt is returning a call °

## 2018-01-11 NOTE — Telephone Encounter (Signed)
Follow up  ° ° °Patient is returning call. Please call to discuss.  °

## 2018-01-12 NOTE — Telephone Encounter (Signed)
Pt calling to report blood pressure readings.   6/18 121/76  63 6/19 137/79  61 6/20 128/76  62  6/21 133/78  64  6/22 136/81  56 6/23 143/85  61 6/24 130/72  66 6/25 125/77  60  Will forward to Dr. Radford Pax for review. Pt verbalized understanding and thankful for the call

## 2018-01-12 NOTE — Telephone Encounter (Signed)
Stable blood pressure and heart rate.  Continue current medical therapy

## 2018-01-14 ENCOUNTER — Telehealth: Payer: Self-pay | Admitting: Cardiology

## 2018-01-14 NOTE — Telephone Encounter (Signed)
Left message to call back  

## 2018-01-14 NOTE — Telephone Encounter (Signed)
Pt stated he was completing his paperwork for his upcoming surgery and would like to know what to document for his cardiac history. I informed him per chart he had history of CAD, CHF, HTN and afib. He stated understanding and thankful for the call

## 2018-01-14 NOTE — Telephone Encounter (Signed)
New Message    Patient is calling requesting to speak with a nurse. He states that he is to have a surgery in August on his knee and was told that he needed to ask the nurse some questions. I did ask is he needing a cardiac clearance and he said no he just wants to leave a message and its a waste of his time to have to wait. Please call.

## 2018-01-19 ENCOUNTER — Encounter (HOSPITAL_COMMUNITY): Payer: Self-pay | Admitting: *Deleted

## 2018-01-25 ENCOUNTER — Telehealth: Payer: Self-pay | Admitting: Cardiology

## 2018-01-25 MED ORDER — PINDOLOL 10 MG PO TABS: 10.0000 mg | ORAL_TABLET | Freq: Two times a day (BID) | ORAL | 3 refills | Status: DC

## 2018-01-25 NOTE — Telephone Encounter (Signed)
New Message    *STAT* If patient is at the pharmacy, call can be transferred to refill team.   1. Which medications need to be refilled? (please list name of each medication and dose if known) pindolol (VISKEN) 10 MG tablet  2. Which pharmacy/location (including street and city if local pharmacy) is medication to be sent to? [id number] - [place], [address]  3. Do they need a 30 day or 90 day supply? [place]

## 2018-01-25 NOTE — Telephone Encounter (Signed)
[facility]. He states its cheaper due to his insurance. I informed pt that I [facility] to [facility]. He stated understanding and thankful for the call

## 2018-02-01 ENCOUNTER — Ambulatory Visit: Payer: Medicare Other | Admitting: Cardiology

## 2018-02-14 ENCOUNTER — Telehealth: Payer: Self-pay | Admitting: Cardiology

## 2018-02-14 NOTE — Telephone Encounter (Signed)
Please check with pharm D since he is on DOAC but I think it is ok

## 2018-02-14 NOTE — Telephone Encounter (Signed)
Will route to our Pharmacist for further review and recommendation, and triage to follow-up with the pt thereafter.

## 2018-02-14 NOTE — Telephone Encounter (Signed)
New Message   Pt c/o medication issue:  1. Name of Medication: Tumeric 500mg  once a day  2. How are you currently taking this medication (dosage and times per day)? 500mg  Once a day  3. Are you having a reaction (difficulty breathing--STAT)? no  4. What is your medication issue? Pt wants to know if its ok to take this. Please call

## 2018-02-14 NOTE — Telephone Encounter (Signed)
Will route to Dr. Turner for review and advisement. 

## 2018-02-15 NOTE — Telephone Encounter (Signed)
Informed patient he will need to monitor for S/S of bleeding should he decide to use turmeric because it can increase bleeding risk. He was grateful for call and agrees with treatment plan.

## 2018-02-15 NOTE — Telephone Encounter (Signed)
Turmeric increases bleeding risk with Eliquis will need to monitor for signs/symptoms of bleeding if chooses to use.

## 2018-02-18 ENCOUNTER — Telehealth: Payer: Self-pay

## 2018-02-18 NOTE — Patient Instructions (Addendum)
[name]  02/18/2018   Your procedure is scheduled on: 02-28-18   Report to [facility] Main  Entrance    Report to Admitting at 11:15 AM    Call this number if you have problems the morning of surgery [phone]   Remember: Do not eat food or drink liquids :After Midnight. You may have a Clear Liquid Diet from Midnight until 7:45 AM. After 7:45 AM, nothing until after surgery.     CLEAR LIQUID DIET   Foods Allowed                                                                     Foods Excluded  Coffee and tea, regular and decaf                             liquids that you cannot  Plain Jell-O in any flavor                                             see through such as: Fruit ices (not with fruit pulp)                                     milk, soups, orange juice  Iced Popsicles                                    All solid food Carbonated beverages, regular and diet                                    Cranberry, grape and apple juices Sports drinks like Gatorade Lightly seasoned clear broth or consume(fat free) Sugar, honey syrup  Sample Menu Breakfast                                Lunch                                     Supper Cranberry juice                    Beef broth                            Chicken broth Jell-O                                     Grape juice  Apple juice Coffee or tea                        Jell-O                                      Popsicle                                                Coffee or tea                        Coffee or tea  _____________________________________________________________________     Take these medicines the morning of surgery with A SIP OF WATER: Atorvastatin (Lipitor), Pindolol (Visken), and Tamsulosin HCL ( Flomax)                                You may not have any metal on your body including hair pins and              piercings  Do not wear jewelry,  lotions, powders, cologne or deodorant             Men may shave face and neck.   Do not bring valuables to the hospital. [place].  Contacts, dentures or bridgework may not be worn into surgery.  Leave suitcase in the car. After surgery it may be brought to your room.       Special Instructions: N/A              Please read over the following fact sheets you were given: _____________________________________________________________________             [facility] - Preparing for Surgery Before surgery, you can play an important role.  Because skin is not sterile, your skin needs to be as free of germs as possible.  You can reduce the number of germs on your skin by washing with CHG (chlorahexidine gluconate) soap before surgery.  CHG is an antiseptic cleaner which kills germs and bonds with the skin to continue killing germs even after washing. Please DO NOT use if you have an allergy to CHG or antibacterial soaps.  If your skin becomes reddened/irritated stop using the CHG and inform your nurse when you arrive at Short Stay. Do not shave (including legs and underarms) for at least 48 hours prior to the first CHG shower.  You may shave your face/neck. Please follow these instructions carefully:  1.  Shower with CHG Soap the night before surgery and the  morning of Surgery.  2.  If you choose to wash your hair, wash your hair first as usual with your  normal  shampoo.  3.  After you shampoo, rinse your hair and body thoroughly to remove the  shampoo.                           4.  Use CHG as you would any other liquid soap.  You can apply chg directly  to the skin and wash  Gently with a scrungie or clean washcloth.  5.  Apply the CHG Soap to your body ONLY FROM THE NECK DOWN.   Do not use on face/ open                           Wound or open sores. Avoid contact with eyes, ears mouth and genitals (private parts).                        Wash face,  Genitals (private parts) with your normal soap.             6.  Wash thoroughly, paying special attention to the area where your surgery  will be performed.  7.  Thoroughly rinse your body with warm water from the neck down.  8.  DO NOT shower/wash with your normal soap after using and rinsing off  the CHG Soap.                9.  Pat yourself dry with a clean towel.            10.  Wear clean pajamas.            11.  Place clean sheets on your bed the night of your first shower and do not  sleep with pets. Day of Surgery : Do not apply any lotions/deodorants the morning of surgery.  Please wear clean clothes to the hospital/surgery center.  FAILURE TO FOLLOW THESE INSTRUCTIONS MAY RESULT IN THE CANCELLATION OF YOUR SURGERY PATIENT SIGNATURE_________________________________  NURSE SIGNATURE__________________________________  ________________________________________________________________________   [name]  An incentive spirometer is a tool that can help keep your lungs clear and active. This tool measures how well you are filling your lungs with each breath. Taking long deep breaths may help reverse or decrease the chance of developing breathing (pulmonary) problems (especially infection) following:  A long period of time when you are unable to move or be active. BEFORE THE PROCEDURE   If the spirometer includes an indicator to show your best effort, your nurse or respiratory therapist will set it to a desired goal.  If possible, sit up straight or lean slightly forward. Try not to slouch.  Hold the incentive spirometer in an upright position. INSTRUCTIONS FOR USE  1. Sit on the edge of your bed if possible, or sit up as far as you can in bed or on a chair. 2. Hold the incentive spirometer in an upright position. 3. Breathe out normally. 4. Place the mouthpiece in your mouth and seal your lips tightly around it. 5. Breathe in slowly and as  deeply as possible, raising the piston or the ball toward the top of the column. 6. Hold your breath for 3-5 seconds or for as long as possible. Allow the piston or ball to fall to the bottom of the column. 7. Remove the mouthpiece from your mouth and breathe out normally. 8. Rest for a few seconds and repeat Steps 1 through 7 at least 10 times every 1-2 hours when you are awake. Take your time and take a few normal breaths between deep breaths. 9. The spirometer may include an indicator to show your best effort. Use the indicator as a goal to work toward during each repetition. 10. After each set of 10 deep breaths, practice coughing to be sure your lungs are clear. If you have an incision (the cut made at the time of  surgery), support your incision when coughing by placing a pillow or rolled up towels firmly against it. Once you are able to get out of bed, walk around indoors and cough well. You may stop using the incentive spirometer when instructed by your caregiver.  RISKS AND COMPLICATIONS  Take your time so you do not get dizzy or light-headed.  If you are in pain, you may need to take or ask for pain medication before doing incentive spirometry. It is harder to take a deep breath if you are having pain. AFTER USE  Rest and breathe slowly and easily.  It can be helpful to keep track of a log of your progress. Your caregiver can provide you with a simple table to help with this. If you are using the spirometer at home, follow these instructions: [place] IF:   You are having difficultly using the spirometer.  You have trouble using the spirometer as often as instructed.  Your pain medication is not giving enough relief while using the spirometer.  You develop fever of 100.5 F (38.1 C) or higher. SEEK IMMEDIATE MEDICAL CARE IF:   You cough up bloody sputum that had not been present before.  You develop fever of 102 F (38.9 C) or greater.  You develop worsening pain  at or near the incision site. MAKE SURE YOU:   Understand these instructions.  Will watch your condition.  Will get help right away if you are not doing well or get worse. Document Released: 11/16/2006 Document Revised: 09/28/2011 Document Reviewed: 01/17/2007 ExitCare Patient Information 2014 Exit[employer], [place].   ________________________________________________________________________  WHAT IS A BLOOD TRANSFUSION? Blood Transfusion Information  A transfusion is the replacement of blood or some of its parts. Blood is made up of multiple cells which provide different functions.  Red blood cells carry oxygen and are used for blood loss replacement.  White blood cells fight against infection.  Platelets control bleeding.  Plasma helps clot blood.  Other blood products are available for specialized needs, such as hemophilia or other clotting disorders. BEFORE THE TRANSFUSION  Who gives blood for transfusions?   Healthy volunteers who are fully evaluated to make sure their blood is safe. This is blood bank blood. Transfusion therapy is the safest it has ever been in the practice of medicine. Before blood is taken from a donor, a complete history is taken to make sure that person has no history of diseases nor engages in risky social behavior (examples are intravenous drug use or sexual activity with multiple partners). The donor's travel history is screened to minimize risk of transmitting infections, such as malaria. The donated blood is tested for signs of infectious diseases, such as HIV and hepatitis. The blood is then tested to be sure it is compatible with you in order to minimize the chance of a transfusion reaction. If you or a relative donates blood, this is often done in anticipation of surgery and is not appropriate for emergency situations. It takes many days to process the donated blood. RISKS AND COMPLICATIONS Although transfusion therapy is very safe and saves many lives, the  main dangers of transfusion include:   Getting an infectious disease.  Developing a transfusion reaction. This is an allergic reaction to something in the blood you were given. Every precaution is taken to prevent this. The decision to have a blood transfusion has been considered carefully by your caregiver before blood is given. Blood is not given unless the benefits outweigh the risks. AFTER THE  TRANSFUSION  Right after receiving a blood transfusion, you will usually feel much better and more energetic. This is especially true if your red blood cells have gotten low (anemic). The transfusion raises the level of the red blood cells which carry oxygen, and this usually causes an energy increase.  The nurse administering the transfusion will monitor you carefully for complications. HOME CARE INSTRUCTIONS  No special instructions are needed after a transfusion. You may find your energy is better. Speak with your caregiver about any limitations on activity for underlying diseases you may have. SEEK MEDICAL CARE IF:   Your condition is not improving after your transfusion.  You develop redness or irritation at the intravenous (IV) site. SEEK IMMEDIATE MEDICAL CARE IF:  Any of the following symptoms occur over the next 12 hours:  Shaking chills.  You have a temperature by mouth above 102 F (38.9 C), not controlled by medicine.  Chest, back, or muscle pain.  People around you feel you are not acting correctly or are confused.  Shortness of breath or difficulty breathing.  Dizziness and fainting.  You get a rash or develop hives.  You have a decrease in urine output.  Your urine turns a dark color or changes to pink, red, or brown. Any of the following symptoms occur over the next 10 days:  You have a temperature by mouth above 102 F (38.9 C), not controlled by medicine.  Shortness of breath.  Weakness after normal activity.  The white part of the eye turns yellow  (jaundice).  You have a decrease in the amount of urine or are urinating less often.  Your urine turns a dark color or changes to pink, red, or brown. Document Released: 07/03/2000 Document Revised: 09/28/2011 Document Reviewed: 02/20/2008 [facility] Patient Information 2014 [place], [place].  _______________________________________________________________________

## 2018-02-18 NOTE — Telephone Encounter (Signed)
   [facility] Pre-operative Risk Assessment    Request for surgical clearance:  1. What type of surgery is being performed? Left Total Knee Arthroplasty   2. When is this surgery scheduled? 02/28/2018   3. What type of clearance is required (medical clearance vs. Pharmacy clearance to hold med vs. Both)? both  4. Are there any medications that need to be held prior to surgery and how long?  Eliquis 5 mg - no specific date range requested   5. Practice name and name of physician performing surgery? Dr. Wynelle Link at [facility]  6. What is your office phone number  [phone]    7.   What is your office fax number  [phone]  8.   Anesthesia type (None, local, MAC, general) ? Not noted- stated "choice" under "type of anesthesia"   Damian Leavell 02/18/2018, 12:27 PM  _________________________________________________________________   (provider comments below)

## 2018-02-18 NOTE — Telephone Encounter (Signed)
   Primary Cardiologist:Traci Turner, MD  Chart reviewed as part of pre-operative protocol coverage. Will route to pharm for input on Eliquis, then pt will need call for assessment.  Charlie Pitter, PA-C 02/18/2018, 2:28 PM

## 2018-02-18 NOTE — Progress Notes (Signed)
12-23-17 (Epic) EKG  10-21-17 (Epic) ECHO

## 2018-02-20 NOTE — Telephone Encounter (Signed)
Patient with diagnosis of Afib on Eliquis for anticoagulation.    Procedure: TKA Date of procedure: 02/28/18  CHADS2-VASc score of  5 (CHF, HTN, AGE, DM2, stroke/tia x 2, CAD, AGE, male)  CrCl 22ml/min  Per office protocol, patient can hold Eliquis for 3 days prior to procedure.

## 2018-02-21 ENCOUNTER — Encounter (HOSPITAL_COMMUNITY)
Admission: RE | Admit: 2018-02-21 | Discharge: 2018-02-21 | Disposition: A | Discharge status: Home / Self Care | Payer: Medicare Other | Source: Ambulatory Visit | Attending: Orthopedic Surgery | Admitting: Orthopedic Surgery

## 2018-02-21 ENCOUNTER — Encounter (HOSPITAL_COMMUNITY): Payer: Self-pay

## 2018-02-21 ENCOUNTER — Other Ambulatory Visit: Payer: Self-pay

## 2018-02-21 DIAGNOSIS — Z01812 Encounter for preprocedural laboratory examination: Secondary | ICD-10-CM | POA: Diagnosis present

## 2018-02-21 DIAGNOSIS — M1712 Unilateral primary osteoarthritis, left knee: Secondary | ICD-10-CM | POA: Insufficient documentation

## 2018-02-21 LAB — CBC
HEMATOCRIT: 38.4 % — AB (ref 39.0–52.0)
Hemoglobin: 12.3 g/dL — ABNORMAL LOW (ref 13.0–17.0)
MCH: 29.1 pg (ref 26.0–34.0)
MCHC: 32 g/dL (ref 30.0–36.0)
MCV: 91 fL (ref 78.0–100.0)
PLATELETS: 232 10*3/uL (ref 150–400)
RBC: 4.22 MIL/uL (ref 4.22–5.81)
RDW: 14.1 % (ref 11.5–15.5)
WBC: 5.1 10*3/uL (ref 4.0–10.5)

## 2018-02-21 LAB — COMPREHENSIVE METABOLIC PANEL
ALBUMIN: 3.7 g/dL (ref 3.5–5.0)
ALT: 20 U/L (ref 0–44)
AST: 31 U/L (ref 15–41)
Alkaline Phosphatase: 57 U/L (ref 38–126)
Anion gap: 6 (ref 5–15)
BUN: 16 mg/dL (ref 8–23)
CHLORIDE: 109 mmol/L (ref 98–111)
CO2: 29 mmol/L (ref 22–32)
Calcium: 9.1 mg/dL (ref 8.9–10.3)
Creatinine, Ser: 1.01 mg/dL (ref 0.61–1.24)
GFR calc Af Amer: >60 mL/min (ref >60)
GFR calc non Af Amer: >60 mL/min (ref >60)
GLUCOSE: 100 mg/dL — AB (ref 70–99)
POTASSIUM: 4.1 mmol/L (ref 3.5–5.1)
SODIUM: 144 mmol/L (ref 135–145)
Total Bilirubin: 0.5 mg/dL (ref 0.3–1.2)
Total Protein: 6.3 g/dL — ABNORMAL LOW (ref 6.5–8.1)

## 2018-02-21 LAB — URINALYSIS, ROUTINE W REFLEX MICROSCOPIC
Bacteria, UA: NONE SEEN
Bilirubin Urine: NEGATIVE
Glucose, UA: NEGATIVE mg/dL
HGB URINE DIPSTICK: NEGATIVE
Ketones, ur: NEGATIVE mg/dL
Nitrite: NEGATIVE
Protein, ur: NEGATIVE mg/dL
Specific Gravity, Urine: 1.01 (ref 1.005–1.030)
pH: 5 (ref 5.0–8.0)

## 2018-02-21 LAB — APTT: APTT: 38 s — AB (ref 24–36)

## 2018-02-21 LAB — PROTIME-INR
INR: 1.22
Prothrombin Time: 15.3 seconds — ABNORMAL HIGH (ref 11.4–15.2)

## 2018-02-21 LAB — SURGICAL PCR SCREEN
MRSA, PCR: NEGATIVE
STAPHYLOCOCCUS AUREUS: NEGATIVE

## 2018-02-21 NOTE — Progress Notes (Signed)
02-21-18 PT, PTT results routed to Dr. Wynelle Link for review

## 2018-02-21 NOTE — Telephone Encounter (Signed)
   Chart reviewed according to protocol.  The patient has a history of coronary artery disease status post stenting to the RCA in 3833, diastolic heart failure, paroxysmal atrial fibrillation, hypertension, hyperlipidemia, ascending thoracic aortic aneurysm.  He has been intolerant to flecainide due to pro-arrhythmia (ventricular tachycardia) in the past.  He underwent recent cardioversion in 6/19.  When last seen, he was in normal sinus rhythm.  Cardiac catheterization in 2007 demonstrated a 90% mid RCA stenosis, distal 90% LAD stenosis in a small portion of the vessel and 50% OM1 stenosis.  The RCA was treated with a bare-metal stent.    Nuclear stress test in 2010 was low risk.    Echo in April 2019 demonstrated normal EF, mild AI and mild pericardial effusion.    Chest CTA 4/19 demonstrated 4.5 cm thoracic aortic aneurysm.  RCRI:  0.9 (low risk).  I have left a message for the patient to call back in order to assess and clinical change since last seen by Dr. Radford Pax on 01/03/18.    Richardson Dopp, PA-C    02/21/2018 4:36 PM

## 2018-02-21 NOTE — Progress Notes (Addendum)
Late entry: Pt called to advised that he had a UA done at [facility], and wanted to know if the UAt needs to be repeate, as pt has to self-cath... UA results received from [facility], and faxed to Dr. Anne Fu office for review. Spoke to [place], Dr. Anne Fu scheduler who indicated that she would have a PA look at the results, and contact patient with a decision. Per pt's, no call from Dr. Anne Fu office.   Called Kelly on 02/21/18 to follow-up. Per Claiborne Billings, [place] would like UA repeated. Also, surgery time is now 1:45 PM on 02-28-18

## 2018-02-22 NOTE — Telephone Encounter (Signed)
Tried to call patient back and left message.  Office staff:  If patient calls back, please document best phone number to reach patient.   If between 1:30-5:00, keep patient on the phone and transfer to PA/NP in [place] clinic.  Richardson Dopp, PA-C    02/22/2018 3:47 PM

## 2018-02-22 NOTE — Telephone Encounter (Signed)
Follow up ° ° ° °Patient is returning call in reference to pre-op clearance.  °

## 2018-02-23 NOTE — Telephone Encounter (Signed)
Follow Up:     Returning Scott's call from yesterday.

## 2018-02-23 NOTE — Telephone Encounter (Signed)
Left message for patient to call back.  Operators: Please ask patient what number and what time we should try to call him back.  Richardson Dopp, PA-C    02/23/2018 3:59 PM

## 2018-02-23 NOTE — Telephone Encounter (Signed)
Spoke with patient 02/23/2018. He is doing well without chest pain or shortness of breath. He may proceed with planned surgery at acceptable risk. As noted, he will hold Eliquis for 3 days prior to surgery.  He will be resumed postop when safe.  Call back staff: I faxed a letter to the surgeon.  Please ensure that he received the letter.  Note will be removed from preop pool.  Richardson Dopp, PA-C 02/23/2018 5:09 PM

## 2018-02-23 NOTE — Telephone Encounter (Signed)
His daughter called back.  However, she is not listed on DPR.  Patient does not turn his cell phone on.  She will have him call back tomorrow.  We need to confirm that he knows to not hold his Eliquis no longer than 3 days prior to his procedure.  Richardson Dopp, PA-C    02/23/2018 4:20 PM

## 2018-02-24 ENCOUNTER — Encounter (HOSPITAL_COMMUNITY): Payer: Self-pay

## 2018-02-24 NOTE — Progress Notes (Signed)
02-24-18 Cardiac clearance on chart.

## 2018-02-24 NOTE — Telephone Encounter (Signed)
Spoke with requesting office and they confirmed that they have received clearance.

## 2018-02-25 ENCOUNTER — Other Ambulatory Visit: Payer: Self-pay | Admitting: Orthopedic Surgery

## 2018-02-25 NOTE — Care Plan (Signed)
L TKA scheduled on 02-28-18 DCP:  Home with dtr DME:  Has a RW and 3-in-1 PT:  Virtual PT

## 2018-02-27 MED ORDER — TRANEXAMIC ACID 1000 MG/10ML IV SOLN
2000.0000 mg | INTRAVENOUS | Status: DC
  Filled 2018-02-27: qty 20

## 2018-02-27 MED ORDER — BUPIVACAINE LIPOSOME 1.3 % IJ SUSP
20.0000 mL | Freq: Once | INTRAMUSCULAR | Status: DC
  Filled 2018-02-27: qty 20

## 2018-02-27 NOTE — H&P (Signed)
TOTAL KNEE ADMISSION H&P  Patient is being admitted for left total knee arthroplasty.  Subjective:  Chief Complaint:left knee pain.  HPI: [name], 77 y.o. male, has a history of pain and functional disability in the left knee due to arthritis and has failed non-surgical conservative treatments for greater than 12 weeks to includeNSAID's and/or analgesics, corticosteriod injections, flexibility and strengthening excercises and activity modification.  Onset of symptoms was gradual, starting >10 years ago with gradually worsening course since that time. The patient noted no past surgery on the left knee(s).  Patient currently rates pain in the left knee(s) at 7 out of 10 with activity. Patient has night pain, worsening of pain with activity and weight bearing, pain that interferes with activities of daily living, pain with passive range of motion, crepitus and joint swelling.  Patient has evidence of periarticular osteophytes and joint space narrowing by imaging studies. There is no active infection.  Patient Active Problem List   Diagnosis Date Noted  . Chronic diastolic CHF (congestive heart failure) ([place])   . CAD (coronary artery disease) 06/26/2013  . Ventricular tachycardia ([place])   . Chronic venous insufficiency   . Dyslipidemia 06/08/2011  . 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 HYPERTENSION, BENIGN 05/09/2009  . ATRIAL FIBRILLATION 05/09/2009   Past Medical History:  Diagnosis Date  . CAD (coronary artery disease)    s/p bare metal stend to RCA  . Chronic diastolic CHF (congestive heart failure) ([place])   . Chronic venous insufficiency   . HTN (hypertension)   . PAF (paroxysmal atrial fibrillation) ([facility])    Not on long term anticoagluation despite CHADS2VASC score of 4 due to patient refusal because he likes to ski and does not want to risk a fall with bleeding complications.  He understands his cardioembolic risk associated with PAF.   Marland Kitchen Pneumothorax   . Ventricular tachycardia  ([facility])    possibly proarrhythmia from flecainide    Past Surgical History:  Procedure Laterality Date  . CARDIOVERSION N/A 12/23/2017   Procedure: CARDIOVERSION;  Surgeon: Sueanne Margarita, MD;  Location: [facility] ENDOSCOPY;  Service: Cardiovascular;  Laterality: N/A;  . INGUINAL HERNIA REPAIR     x2    Current Facility-Administered Medications  Medication Dose Route Frequency Provider Last Rate Last Dose  . [START ON 02/28/2018] bupivacaine liposome (EXPAREL) 1.3 % injection 266 mg  20 mL Other Once Gaynelle Arabian, MD      . Derrill Memo ON 02/28/2018] tranexamic acid (CYKLOKAPRON) 2,000 mg in sodium chloride 0.9 % 50 mL Topical Application  6,144 mg Topical To OR Gaynelle Arabian, MD       Current Outpatient Medications  Medication Sig Dispense Refill Last Dose  . apixaban (ELIQUIS) 5 MG TABS tablet Take 1 tablet (5 mg total) by mouth 2 (two) times daily. (Patient not taking: Reported on 02/21/2018) 60 tablet 11 Not Taking at Unknown time  . Ascorbic Acid (VITAMIN C PO) Take 1 tablet by mouth daily.    Taking  . atorvastatin (LIPITOR) 20 MG tablet TAKE ONE TABLET (20 MG) BY MOUTH DAILY. 90 tablet 3 Taking  . Cholecalciferol (VITAMIN D3) 2000 units TABS Take 2,000 Units by mouth daily.     . Coenzyme Q10 (CO Q 10 PO) Take 1 tablet by mouth daily.   Taking  . Cyanocobalamin (B-12) 2500 MCG TABS Take 2,500 mcg by mouth daily.    Taking  . fish oil-omega-3 fatty acids 1000 MG capsule Take 2 g by mouth daily.     Taking  .  furosemide (LASIX) 20 MG tablet Take 1 tablet (20 mg total) by mouth daily. Take extra 20 mg once a day as needed for edema 135 tablet 3 Taking  . glucosamine-chondroitin 500-400 MG tablet Take 1 tablet by mouth 2 (two) times daily.    Taking  . Magnesium ([employer] MAGNESIUM) 250 MG TABS Take 250 mg by mouth daily.     . Multiple Vitamin (MULTIVITAMIN) tablet Take 1 tablet by mouth daily.     Taking  . nitroGLYCERIN (NITROSTAT) 0.4 MG SL tablet Place 1 tablet (0.4 mg total) under the tongue every 5  (five) minutes as needed for chest pain. 25 tablet 3 Taking  . pindolol (VISKEN) 10 MG tablet Take 1 tablet (10 mg total) by mouth 2 (two) times daily. 180 tablet 3   . Tamsulosin HCl (FLOMAX) 0.4 MG CAPS Take 0.4 mg by mouth 2 (two) times daily.     Taking   No Known Allergies  Social History   Tobacco Use  . Smoking status: Never Smoker  . Smokeless tobacco: Never Used  Substance Use Topics  . Alcohol use: No    Comment: occsaoinal    Family History  Problem Relation Age of Onset  . Coronary artery disease Unknown      Review of Systems  Constitutional: Negative.   HENT: Positive for hearing loss. Negative for congestion, ear discharge, ear pain, nosebleeds, sinus pain, sore throat and tinnitus.   Eyes: Negative.   Respiratory: Negative.  Negative for stridor.   Cardiovascular: Negative.   Gastrointestinal: Negative.   Genitourinary: Negative.   Musculoskeletal: Positive for joint pain and myalgias. Negative for back pain, falls and neck pain.  Skin: Negative.   Neurological: Negative.   Endo/Heme/Allergies: Negative.   Psychiatric/Behavioral: Positive for memory loss. Negative for depression, hallucinations, substance abuse and suicidal ideas. The patient is nervous/anxious. The patient does not have insomnia.     Objective:  Physical Exam  Constitutional: He is oriented to person, place, and time. He appears well-developed and well-nourished. No distress.  HENT:  Head: Normocephalic and atraumatic.  Right Ear: External ear normal.  Left Ear: External ear normal.  Nose: Nose normal.  Mouth/Throat: Oropharynx is clear and moist.  Eyes: Conjunctivae and EOM are normal.  Neck: Normal range of motion. Neck supple.  Cardiovascular: Normal rate, normal heart sounds and intact distal pulses. An irregularly irregular rhythm present.  Respiratory: Effort normal and breath sounds normal. No respiratory distress. He has no wheezes.  GI: Soft. Bowel sounds are normal. He  exhibits no distension. There is no tenderness.  Musculoskeletal:       Right hip: Normal.       Left hip: Normal.       Right knee: Normal.  His LEFT knee shows no effusion. He has a valgus deformity in the knee. He is tender lateral greater than medial and range of motion is 5-125. There is no instability about the knee.  Neurological: He is alert and oriented to person, place, and time. He has normal strength. No sensory deficit.  Skin: No rash noted. He is not diaphoretic. No erythema.  Psychiatric: He has a normal mood and affect.    Vital Signs BP:144/78 HR: 76 bpm   Estimated body mass index is 19.58 kg/m as calculated from the following:   Height as of 02/21/18: 6\' 2"  (1.88 m).   Weight as of 02/21/18: 69.2 kg.   Imaging Review Plain radiographs demonstrate severe degenerative joint disease of the left knee(s). The  overall alignment ismild valgus. The bone quality appears to be fair for age and reported activity level.   Preoperative templating of the joint replacement has been completed, documented, and submitted to the Operating Room personnel in order to optimize intra-operative equipment management.   Anticipated LOS equal to or greater than 2 midnights due to - Age 62 and older with one or more of the following:  - Obesity  - Expected need for hospital services (PT, OT, Nursing) required for safe  discharge  - Anticipated need for postoperative skilled nursing care or inpatient rehab  - Active co-morbidities: Coronary Artery Disease and Cardiac Arrhythmia OR   - Unanticipated findings during/Post Surgery: None  - Patient is a high risk of re-admission due to: None     Assessment/Plan:  End stage primary osteoarthritis, left knee   The patient history, physical examination, clinical judgment of the provider and imaging studies are consistent with end stage degenerative joint disease of the left knee(s) and total knee arthroplasty is deemed medically necessary.  The treatment options including medical management, injection therapy arthroscopy and arthroplasty were discussed at length. The risks and benefits of total knee arthroplasty were presented and reviewed. The risks due to aseptic loosening, infection, stiffness, patella tracking problems, thromboembolic complications and other imponderables were discussed. The patient acknowledged the explanation, agreed to proceed with the plan and consent was signed. Patient is being admitted for inpatient treatment for surgery, pain control, PT, OT, prophylactic antibiotics, VTE prophylaxis, progressive ambulation and ADL's and discharge planning. The patient is planning to be discharged home    Therapy Plans: outpatient PT Disposition: Home with alone; daughter nearby PCP: Dr. Fransico Him Other: some memory issues   Ardeen Jourdain, PA-C

## 2018-02-28 ENCOUNTER — Inpatient Hospital Stay (HOSPITAL_COMMUNITY): Payer: Medicare Other | Admitting: Anesthesiology

## 2018-02-28 ENCOUNTER — Encounter (HOSPITAL_COMMUNITY)
Admission: RE | Disposition: A | Discharge status: Home / Self Care | Payer: Self-pay | Source: Ambulatory Visit | Attending: Orthopedic Surgery

## 2018-02-28 ENCOUNTER — Inpatient Hospital Stay (HOSPITAL_COMMUNITY)
Admission: RE | Admit: 2018-02-28 | Discharge: 2018-03-08 | DRG: 470 | Disposition: A | Discharge status: Home / Self Care | Payer: Medicare Other | Source: Ambulatory Visit | Attending: Orthopedic Surgery | Admitting: Orthopedic Surgery

## 2018-02-28 ENCOUNTER — Encounter (HOSPITAL_COMMUNITY): Payer: Self-pay

## 2018-02-28 ENCOUNTER — Other Ambulatory Visit: Payer: Self-pay

## 2018-02-28 DIAGNOSIS — I97191 Other postprocedural cardiac functional disturbances following other surgery: Secondary | ICD-10-CM | POA: Diagnosis not present

## 2018-02-28 DIAGNOSIS — I739 Peripheral vascular disease, unspecified: Secondary | ICD-10-CM | POA: Diagnosis present

## 2018-02-28 DIAGNOSIS — I480 Paroxysmal atrial fibrillation: Secondary | ICD-10-CM | POA: Diagnosis present

## 2018-02-28 DIAGNOSIS — Z955 Presence of coronary angioplasty implant and graft: Secondary | ICD-10-CM | POA: Diagnosis not present

## 2018-02-28 DIAGNOSIS — I110 Hypertensive heart disease with heart failure: Secondary | ICD-10-CM | POA: Diagnosis present

## 2018-02-28 DIAGNOSIS — E785 Hyperlipidemia, unspecified: Secondary | ICD-10-CM | POA: Diagnosis present

## 2018-02-28 DIAGNOSIS — I4891 Unspecified atrial fibrillation: Secondary | ICD-10-CM

## 2018-02-28 DIAGNOSIS — D649 Anemia, unspecified: Secondary | ICD-10-CM | POA: Diagnosis not present

## 2018-02-28 DIAGNOSIS — Z79899 Other long term (current) drug therapy: Secondary | ICD-10-CM | POA: Diagnosis not present

## 2018-02-28 DIAGNOSIS — I340 Nonrheumatic mitral (valve) insufficiency: Secondary | ICD-10-CM | POA: Diagnosis not present

## 2018-02-28 DIAGNOSIS — I4892 Unspecified atrial flutter: Secondary | ICD-10-CM | POA: Diagnosis not present

## 2018-02-28 DIAGNOSIS — M1710 Unilateral primary osteoarthritis, unspecified knee: Secondary | ICD-10-CM | POA: Diagnosis not present

## 2018-02-28 DIAGNOSIS — Y838 Other surgical procedures as the cause of abnormal reaction of the patient, or of later complication, without mention of misadventure at the time of the procedure: Secondary | ICD-10-CM | POA: Diagnosis not present

## 2018-02-28 DIAGNOSIS — Z7901 Long term (current) use of anticoagulants: Secondary | ICD-10-CM | POA: Diagnosis not present

## 2018-02-28 DIAGNOSIS — I951 Orthostatic hypotension: Secondary | ICD-10-CM | POA: Diagnosis not present

## 2018-02-28 DIAGNOSIS — M179 Osteoarthritis of knee, unspecified: Secondary | ICD-10-CM | POA: Diagnosis present

## 2018-02-28 DIAGNOSIS — I2510 Atherosclerotic heart disease of native coronary artery without angina pectoris: Secondary | ICD-10-CM | POA: Diagnosis present

## 2018-02-28 DIAGNOSIS — I5032 Chronic diastolic (congestive) heart failure: Secondary | ICD-10-CM | POA: Diagnosis present

## 2018-02-28 DIAGNOSIS — I25118 Atherosclerotic heart disease of native coronary artery with other forms of angina pectoris: Secondary | ICD-10-CM | POA: Diagnosis not present

## 2018-02-28 DIAGNOSIS — M1712 Unilateral primary osteoarthritis, left knee: Secondary | ICD-10-CM | POA: Diagnosis present

## 2018-02-28 HISTORY — PX: TOTAL KNEE ARTHROPLASTY: SHX125

## 2018-02-28 LAB — TYPE AND SCREEN
ABO/RH(D): O POS
ANTIBODY SCREEN: NEGATIVE

## 2018-02-28 SURGERY — ARTHROPLASTY, KNEE, TOTAL
Anesthesia: Spinal | Site: Knee | Laterality: Left

## 2018-02-28 MED ORDER — LACTATED RINGERS IV SOLN
INTRAVENOUS | Status: DC
  Administered 2018-02-28 (×2): via INTRAVENOUS

## 2018-02-28 MED ORDER — MENTHOL 3 MG MT LOZG
1.0000 | LOZENGE | OROMUCOSAL | Status: DC | PRN
  Filled 2018-02-28: qty 9

## 2018-02-28 MED ORDER — SODIUM CHLORIDE 0.9 % IR SOLN
Status: DC | PRN
  Administered 2018-02-28: 1000 mL

## 2018-02-28 MED ORDER — ONDANSETRON HCL 4 MG PO TABS: 4.0000 mg | ORAL_TABLET | Freq: Four times a day (QID) | ORAL | Status: DC | PRN

## 2018-02-28 MED ORDER — PROPOFOL 10 MG/ML IV BOLUS
INTRAVENOUS | Status: AC
  Filled 2018-02-28: qty 20

## 2018-02-28 MED ORDER — EPHEDRINE SULFATE 50 MG/ML IJ SOLN
INTRAMUSCULAR | Status: DC | PRN
  Administered 2018-02-28 (×2): 10 mg via INTRAVENOUS

## 2018-02-28 MED ORDER — FUROSEMIDE 20 MG PO TABS
20.0000 mg | ORAL_TABLET | Freq: Every day | ORAL | Status: DC
  Administered 2018-03-01 – 2018-03-04 (×4): 20 mg via ORAL
  Filled 2018-02-28 (×4): qty 1

## 2018-02-28 MED ORDER — METHOCARBAMOL 500 MG PO TABS
500.0000 mg | ORAL_TABLET | Freq: Four times a day (QID) | ORAL | Status: DC | PRN
  Administered 2018-03-01 – 2018-03-03 (×2): 500 mg via ORAL
  Filled 2018-02-28 (×2): qty 1

## 2018-02-28 MED ORDER — METHOCARBAMOL 500 MG IVPB - SIMPLE MED
INTRAVENOUS | Status: AC
Start: 2018-02-28 — End: 2018-03-01
  Filled 2018-02-28: qty 50

## 2018-02-28 MED ORDER — SODIUM CHLORIDE 0.9 % IJ SOLN
INTRAMUSCULAR | Status: DC | PRN
  Administered 2018-02-28: 60 mL

## 2018-02-28 MED ORDER — DIPHENHYDRAMINE HCL 12.5 MG/5ML PO ELIX: 12.5000 mg | ORAL_SOLUTION | ORAL | Status: DC | PRN

## 2018-02-28 MED ORDER — HYDROMORPHONE HCL 1 MG/ML IJ SOLN
0.5000 mg | INTRAMUSCULAR | Status: DC | PRN
  Administered 2018-02-28: 0.5 mg via INTRAVENOUS
  Administered 2018-03-01: 1 mg via INTRAVENOUS
  Filled 2018-02-28 (×2): qty 1

## 2018-02-28 MED ORDER — ONDANSETRON HCL 4 MG/2ML IJ SOLN: 4.0000 mg | Freq: Four times a day (QID) | INTRAMUSCULAR | Status: DC | PRN

## 2018-02-28 MED ORDER — TRAMADOL HCL 50 MG PO TABS: 50.0000 mg | ORAL_TABLET | Freq: Four times a day (QID) | ORAL | Status: DC | PRN

## 2018-02-28 MED ORDER — CEFAZOLIN SODIUM-DEXTROSE 2-4 GM/100ML-% IV SOLN
2.0000 g | INTRAVENOUS | Status: AC
  Administered 2018-02-28: 2 g via INTRAVENOUS
  Filled 2018-02-28: qty 100

## 2018-02-28 MED ORDER — METOCLOPRAMIDE HCL 5 MG PO TABS: 5.0000 mg | ORAL_TABLET | Freq: Three times a day (TID) | ORAL | Status: DC | PRN

## 2018-02-28 MED ORDER — SODIUM CHLORIDE 0.9 % IJ SOLN
INTRAMUSCULAR | Status: AC
  Filled 2018-02-28: qty 10

## 2018-02-28 MED ORDER — PROPOFOL 10 MG/ML IV BOLUS
INTRAVENOUS | Status: AC
  Filled 2018-02-28: qty 40

## 2018-02-28 MED ORDER — DEXAMETHASONE SODIUM PHOSPHATE 10 MG/ML IJ SOLN
10.0000 mg | Freq: Once | INTRAMUSCULAR | Status: AC
  Administered 2018-03-01: 10 mg via INTRAVENOUS
  Filled 2018-02-28: qty 1

## 2018-02-28 MED ORDER — DEXAMETHASONE SODIUM PHOSPHATE 10 MG/ML IJ SOLN
INTRAMUSCULAR | Status: AC
  Filled 2018-02-28: qty 1

## 2018-02-28 MED ORDER — ONDANSETRON HCL 4 MG/2ML IJ SOLN: 4.0000 mg | Freq: Once | INTRAMUSCULAR | Status: DC | PRN

## 2018-02-28 MED ORDER — METOCLOPRAMIDE HCL 5 MG/ML IJ SOLN: 5.0000 mg | Freq: Three times a day (TID) | INTRAMUSCULAR | Status: DC | PRN

## 2018-02-28 MED ORDER — TRANEXAMIC ACID 1000 MG/10ML IV SOLN: 1000.0000 mg | INTRAVENOUS | Status: DC

## 2018-02-28 MED ORDER — OXYCODONE HCL 5 MG PO TABS
10.0000 mg | ORAL_TABLET | ORAL | Status: DC | PRN
  Administered 2018-03-04 – 2018-03-07 (×4): 10 mg via ORAL
  Filled 2018-02-28 (×2): qty 2

## 2018-02-28 MED ORDER — PINDOLOL 5 MG PO TABS
10.0000 mg | ORAL_TABLET | Freq: Two times a day (BID) | ORAL | Status: DC
  Administered 2018-02-28 – 2018-03-08 (×14): 10 mg via ORAL
  Filled 2018-02-28 (×17): qty 2

## 2018-02-28 MED ORDER — APIXABAN 2.5 MG PO TABS
2.5000 mg | ORAL_TABLET | Freq: Two times a day (BID) | ORAL | Status: DC
  Administered 2018-03-01 – 2018-03-04 (×7): 2.5 mg via ORAL
  Filled 2018-02-28 (×7): qty 1

## 2018-02-28 MED ORDER — PHENYLEPHRINE 40 MCG/ML (10ML) SYRINGE FOR IV PUSH (FOR BLOOD PRESSURE SUPPORT)
PREFILLED_SYRINGE | INTRAVENOUS | Status: DC | PRN
  Administered 2018-02-28: 80 ug via INTRAVENOUS

## 2018-02-28 MED ORDER — BUPIVACAINE IN DEXTROSE 0.75-8.25 % IT SOLN
INTRATHECAL | Status: DC | PRN
  Administered 2018-02-28: 1.6 mL via INTRATHECAL

## 2018-02-28 MED ORDER — ONDANSETRON HCL 4 MG/2ML IJ SOLN
INTRAMUSCULAR | Status: DC | PRN
  Administered 2018-02-28: 4 mg via INTRAVENOUS

## 2018-02-28 MED ORDER — OXYCODONE HCL 5 MG PO TABS
5.0000 mg | ORAL_TABLET | ORAL | Status: DC | PRN
  Administered 2018-03-01: 5 mg via ORAL
  Administered 2018-03-01: 10 mg via ORAL
  Administered 2018-03-01: 5 mg via ORAL
  Administered 2018-03-01 – 2018-03-07 (×5): 10 mg via ORAL
  Filled 2018-02-28: qty 1
  Filled 2018-02-28 (×10): qty 2

## 2018-02-28 MED ORDER — MIDAZOLAM HCL 2 MG/2ML IJ SOLN
1.0000 mg | INTRAMUSCULAR | Status: DC
  Filled 2018-02-28: qty 2

## 2018-02-28 MED ORDER — DEXAMETHASONE SODIUM PHOSPHATE 10 MG/ML IJ SOLN
8.0000 mg | Freq: Once | INTRAMUSCULAR | Status: AC
  Administered 2018-02-28: 10 mg via INTRAVENOUS

## 2018-02-28 MED ORDER — ACETAMINOPHEN 10 MG/ML IV SOLN
1000.0000 mg | Freq: Four times a day (QID) | INTRAVENOUS | Status: DC
  Administered 2018-02-28: 1000 mg via INTRAVENOUS
  Filled 2018-02-28: qty 100

## 2018-02-28 MED ORDER — BISACODYL 10 MG RE SUPP: 10.0000 mg | Freq: Every day | RECTAL | Status: DC | PRN

## 2018-02-28 MED ORDER — FENTANYL CITRATE (PF) 100 MCG/2ML IJ SOLN: 25.0000 ug | INTRAMUSCULAR | Status: DC | PRN

## 2018-02-28 MED ORDER — FLEET ENEMA 7-19 GM/118ML RE ENEM: 1.0000 | ENEMA | Freq: Once | RECTAL | Status: DC | PRN

## 2018-02-28 MED ORDER — FENTANYL CITRATE (PF) 100 MCG/2ML IJ SOLN
50.0000 ug | INTRAMUSCULAR | Status: DC
Start: 2018-02-28 — End: 2018-02-28
  Administered 2018-02-28: 50 ug via INTRAVENOUS
  Filled 2018-02-28: qty 2

## 2018-02-28 MED ORDER — STERILE WATER FOR IRRIGATION IR SOLN
Status: DC | PRN
  Administered 2018-02-28: 2000 mL

## 2018-02-28 MED ORDER — POLYETHYLENE GLYCOL 3350 17 G PO PACK: 17.0000 g | PACK | Freq: Every day | ORAL | Status: DC | PRN

## 2018-02-28 MED ORDER — ONDANSETRON HCL 4 MG/2ML IJ SOLN
INTRAMUSCULAR | Status: AC
  Filled 2018-02-28: qty 2

## 2018-02-28 MED ORDER — ROPIVACAINE HCL 7.5 MG/ML IJ SOLN
INTRAMUSCULAR | Status: DC | PRN
  Administered 2018-02-28: 20 mL via PERINEURAL

## 2018-02-28 MED ORDER — SODIUM CHLORIDE 0.9 % IJ SOLN
INTRAMUSCULAR | Status: AC
  Filled 2018-02-28: qty 50

## 2018-02-28 MED ORDER — DOCUSATE SODIUM 100 MG PO CAPS
100.0000 mg | ORAL_CAPSULE | Freq: Two times a day (BID) | ORAL | Status: DC
  Administered 2018-02-28 – 2018-03-07 (×15): 100 mg via ORAL
  Filled 2018-02-28 (×15): qty 1

## 2018-02-28 MED ORDER — 0.9 % SODIUM CHLORIDE (POUR BTL) OPTIME
TOPICAL | Status: DC | PRN
  Administered 2018-02-28: 1000 mL

## 2018-02-28 MED ORDER — OXYCODONE HCL 5 MG/5ML PO SOLN
5.0000 mg | Freq: Once | ORAL | Status: DC | PRN
  Filled 2018-02-28: qty 5

## 2018-02-28 MED ORDER — BUPIVACAINE LIPOSOME 1.3 % IJ SUSP
INTRAMUSCULAR | Status: DC | PRN
  Administered 2018-02-28: 20 mL

## 2018-02-28 MED ORDER — CHLORHEXIDINE GLUCONATE 4 % EX LIQD: 60.0000 mL | Freq: Once | CUTANEOUS | Status: DC

## 2018-02-28 MED ORDER — PROPOFOL 500 MG/50ML IV EMUL
INTRAVENOUS | Status: DC | PRN
  Administered 2018-02-28: 100 ug/kg/min via INTRAVENOUS

## 2018-02-28 MED ORDER — METHOCARBAMOL 500 MG IVPB - SIMPLE MED
500.0000 mg | Freq: Four times a day (QID) | INTRAVENOUS | Status: DC | PRN
  Filled 2018-02-28: qty 50

## 2018-02-28 MED ORDER — NITROGLYCERIN 0.4 MG SL SUBL: 0.4000 mg | SUBLINGUAL_TABLET | SUBLINGUAL | Status: DC | PRN

## 2018-02-28 MED ORDER — SODIUM CHLORIDE 0.9 % IV SOLN
1000.0000 mg | INTRAVENOUS | Status: AC
  Administered 2018-02-28: 1000 mg via INTRAVENOUS
  Filled 2018-02-28: qty 10

## 2018-02-28 MED ORDER — TAMSULOSIN HCL 0.4 MG PO CAPS
0.4000 mg | ORAL_CAPSULE | Freq: Two times a day (BID) | ORAL | Status: DC
  Administered 2018-02-28 – 2018-03-07 (×15): 0.4 mg via ORAL
  Filled 2018-02-28 (×15): qty 1

## 2018-02-28 MED ORDER — SODIUM CHLORIDE 0.9 % IV SOLN
INTRAVENOUS | Status: DC
  Administered 2018-02-28 – 2018-03-06 (×3): via INTRAVENOUS

## 2018-02-28 MED ORDER — PHENOL 1.4 % MT LIQD
1.0000 | OROMUCOSAL | Status: DC | PRN
  Filled 2018-02-28: qty 177

## 2018-02-28 MED ORDER — ACETAMINOPHEN 500 MG PO TABS
1000.0000 mg | ORAL_TABLET | Freq: Four times a day (QID) | ORAL | Status: AC
  Administered 2018-02-28 – 2018-03-01 (×4): 1000 mg via ORAL
  Filled 2018-02-28 (×3): qty 2

## 2018-02-28 MED ORDER — OXYCODONE HCL 5 MG PO TABS: 5.0000 mg | ORAL_TABLET | Freq: Once | ORAL | Status: DC | PRN

## 2018-02-28 MED ORDER — ATORVASTATIN CALCIUM 20 MG PO TABS
20.0000 mg | ORAL_TABLET | Freq: Every day | ORAL | Status: DC
  Administered 2018-03-01 – 2018-03-08 (×8): 20 mg via ORAL
  Filled 2018-02-28 (×8): qty 1

## 2018-02-28 MED ORDER — CEFAZOLIN SODIUM-DEXTROSE 2-4 GM/100ML-% IV SOLN
2.0000 g | Freq: Four times a day (QID) | INTRAVENOUS | Status: AC
  Administered 2018-02-28 – 2018-03-01 (×2): 2 g via INTRAVENOUS
  Filled 2018-02-28 (×2): qty 100

## 2018-02-28 SURGICAL SUPPLY — 57 items
BAG ZIPLOCK 12X15 (MISCELLANEOUS) ×3 IMPLANT
BANDAGE ACE 6X5 VEL STRL LF (GAUZE/BANDAGES/DRESSINGS) ×3 IMPLANT
BLADE SAG 18X100X1.27 (BLADE) ×3 IMPLANT
BLADE SAW SGTL 11.0X1.19X90.0M (BLADE) ×3 IMPLANT
BOWL SMART MIX CTS (DISPOSABLE) ×3 IMPLANT
CEMENT HV SMART SET (Cement) ×6 IMPLANT
CEMENT TIBIA MBT SIZE 5 (Knees) ×1 IMPLANT
CLOSURE WOUND 1/2 X4 (GAUZE/BANDAGES/DRESSINGS) ×2
COVER SURGICAL LIGHT HANDLE (MISCELLANEOUS) ×3 IMPLANT
CUFF TOURN SGL QUICK 34 (TOURNIQUET CUFF) ×2
CUFF TRNQT CYL 34X4X40X1 (TOURNIQUET CUFF) ×1 IMPLANT
DECANTER SPIKE VIAL GLASS SM (MISCELLANEOUS) ×3 IMPLANT
DRAPE U-SHAPE 47X51 STRL (DRAPES) ×3 IMPLANT
DRSG ADAPTIC 3X8 NADH LF (GAUZE/BANDAGES/DRESSINGS) ×3 IMPLANT
DRSG PAD ABDOMINAL 8X10 ST (GAUZE/BANDAGES/DRESSINGS) ×3 IMPLANT
DURAPREP 26ML APPLICATOR (WOUND CARE) ×3 IMPLANT
ELECT REM PT RETURN 15FT ADLT (MISCELLANEOUS) ×3 IMPLANT
EVACUATOR 1/8 PVC DRAIN (DRAIN) ×3 IMPLANT
FEMUR SIGMA PS SZ 4.0 L (Knees) ×3 IMPLANT
GAUZE SPONGE 4X4 12PLY STRL (GAUZE/BANDAGES/DRESSINGS) ×3 IMPLANT
GLOVE BIO SURGEON STRL SZ7 (GLOVE) ×3 IMPLANT
GLOVE BIO SURGEON STRL SZ8 (GLOVE) ×6 IMPLANT
GLOVE BIOGEL PI IND STRL 6.5 (GLOVE) ×1 IMPLANT
GLOVE BIOGEL PI IND STRL 7.0 (GLOVE) ×2 IMPLANT
GLOVE BIOGEL PI IND STRL 8 (GLOVE) ×1 IMPLANT
GLOVE BIOGEL PI IND STRL 9 (GLOVE) ×1 IMPLANT
GLOVE BIOGEL PI INDICATOR 6.5 (GLOVE) ×2
GLOVE BIOGEL PI INDICATOR 7.0 (GLOVE) ×4
GLOVE BIOGEL PI INDICATOR 8 (GLOVE) ×2
GLOVE BIOGEL PI INDICATOR 9 (GLOVE) ×2
GLOVE ECLIPSE 8.5 STRL (GLOVE) ×3 IMPLANT
GLOVE SURG SS PI 6.5 STRL IVOR (GLOVE) ×6 IMPLANT
GOWN STRL REUS W/TWL LRG LVL3 (GOWN DISPOSABLE) ×6 IMPLANT
HANDPIECE INTERPULSE COAX TIP (DISPOSABLE) ×2
HOLDER FOLEY CATH W/STRAP (MISCELLANEOUS) IMPLANT
IMMOBILIZER KNEE 20 (SOFTGOODS) ×6 IMPLANT
IMMOBILIZER KNEE 20 THIGH 36 (SOFTGOODS) ×1 IMPLANT
MANIFOLD NEPTUNE II (INSTRUMENTS) ×3 IMPLANT
NS IRRIG 1000ML POUR BTL (IV SOLUTION) ×3 IMPLANT
PACK TOTAL KNEE CUSTOM (KITS) ×3 IMPLANT
PAD ABD 8X10 STRL (GAUZE/BANDAGES/DRESSINGS) ×3 IMPLANT
PADDING CAST COTTON 6X4 STRL (CAST SUPPLIES) ×6 IMPLANT
PATELLA DOME PFC 41MM (Knees) ×3 IMPLANT
PLATE ROT INSERT 12.5MM SIZE 4 (Plate) ×3 IMPLANT
POSITIONER SURGICAL ARM (MISCELLANEOUS) ×3 IMPLANT
SET HNDPC FAN SPRY TIP SCT (DISPOSABLE) ×1 IMPLANT
STRIP CLOSURE SKIN 1/2X4 (GAUZE/BANDAGES/DRESSINGS) ×4 IMPLANT
SUT MNCRL AB 4-0 PS2 18 (SUTURE) ×3 IMPLANT
SUT STRATAFIX 0 PDS 27 VIOLET (SUTURE) ×3
SUT VIC AB 2-0 CT1 27 (SUTURE) ×6
SUT VIC AB 2-0 CT1 TAPERPNT 27 (SUTURE) ×3 IMPLANT
SUTURE STRATFX 0 PDS 27 VIOLET (SUTURE) ×1 IMPLANT
TIBIA MBT CEMENT SIZE 5 (Knees) ×3 IMPLANT
TRAY FOLEY MTR SLVR 16FR STAT (SET/KITS/TRAYS/PACK) ×3 IMPLANT
WATER STERILE IRR 1000ML POUR (IV SOLUTION) ×6 IMPLANT
WRAP KNEE MAXI GEL POST OP (GAUZE/BANDAGES/DRESSINGS) ×3 IMPLANT
YANKAUER SUCT BULB TIP 10FT TU (MISCELLANEOUS) ×3 IMPLANT

## 2018-02-28 NOTE — Anesthesia Procedure Notes (Signed)
Anesthesia Regional Block: Adductor canal block   Pre-Anesthetic Checklist: ,, timeout performed, Correct Patient, Correct Site, Correct Laterality, Correct Procedure, Correct Position, site marked, Risks and benefits discussed,  Surgical consent,  Pre-op evaluation,  At surgeon's request and post-op pain management  Laterality: Left  Prep: chloraprep       Needles:  Injection technique: Single-shot  Needle Type: Echogenic Needle     Needle Length: 10cm  Needle Gauge: 21     Additional Needles:   Narrative:  Start time: 02/28/2018 1:16 PM End time: 02/28/2018 1:20 PM Injection made incrementally with aspirations every 5 mL.  Performed by: Personally  Anesthesiologist: Audry Pili, MD  Additional Notes: No pain on injection. No increased resistance to injection. Injection made in 5cc increments. Good needle visualization. Patient tolerated the procedure well.

## 2018-02-28 NOTE — Transfer of Care (Signed)
Immediate Anesthesia Transfer of Care Note  Patient: [name]  Procedure(s) Performed: LEFT TOTAL KNEE ARTHROPLASTY (Left Knee)  Patient Location: PACU  Anesthesia Type:Spinal  Level of Consciousness: awake, alert , oriented and patient cooperative  Airway & Oxygen Therapy: Patient Spontanous Breathing and Patient connected to face mask oxygen  Post-op Assessment: Report given to RN, Post -op Vital signs reviewed and stable and Patient moving all extremities  Post vital signs: Reviewed and stable  Last Vitals:  Vitals Value Taken Time  BP 149/84 02/28/2018  3:45 PM  Temp    Pulse 63 02/28/2018  3:47 PM  Resp 15 02/28/2018  3:47 PM  SpO2 100 % 02/28/2018  3:47 PM  Vitals shown include unvalidated device data.  Last Pain:  Vitals:   02/28/18 1345  TempSrc:   PainSc: 0-No pain         Complications: No apparent anesthesia complications

## 2018-02-28 NOTE — Discharge Instructions (Addendum)
° °Dr. Frank Aluisio °Total Joint Specialist °[facility] °[address] °[place], [place] [address] °[phone] ° °TOTAL KNEE REPLACEMENT POSTOPERATIVE DIRECTIONS ° °Knee Rehabilitation, Guidelines Following Surgery  °Results after knee surgery are often greatly improved when you follow the exercise, range of motion and muscle strengthening exercises prescribed by your doctor. Safety measures are also important to protect the knee from further injury. Any time any of these exercises cause you to have increased pain or swelling in your knee joint, decrease the amount until you are comfortable again and slowly increase them. If you have problems or questions, call your caregiver or physical therapist for advice.  ° °HOME CARE INSTRUCTIONS  °Remove items at home which could result in a fall. This includes throw rugs or furniture in walking pathways.  °· ICE to the affected knee every three hours for 30 minutes at a time and then as needed for pain and swelling.  Continue to use ice on the knee for pain and swelling from surgery. You may notice swelling that will progress down to the foot and ankle.  This is normal after surgery.  Elevate the leg when you are not up walking on it.   °· Continue to use the breathing machine which will help keep your temperature down.  It is common for your temperature to cycle up and down following surgery, especially at night when you are not up moving around and exerting yourself.  The breathing machine keeps your lungs expanded and your temperature down. °· Do not place pillow under knee, focus on keeping the knee straight while resting ° °DIET °You may resume your previous home diet once your are discharged from the hospital. ° °DRESSING / WOUND CARE / SHOWERING °You may change your dressing every day with sterile gauze.  Please use good hand washing techniques before changing the dressing.  Do not use any lotions or creams on the incision until instructed by your  surgeon. °You may start showering once you are discharged home but do not submerge the incision under water. Just pat the incision dry and apply a dry gauze dressing on daily. °Change the surgical dressing daily and reapply a dry dressing each time. ° °ACTIVITY °Walk with your walker as instructed. °Use walker as long as suggested by your caregivers. °Avoid periods of inactivity such as sitting longer than an hour when not asleep. This helps prevent blood clots.  °You may resume a sexual relationship in one month or when given the OK by your doctor.  °You may return to work once you are cleared by your doctor.  °Do not drive a car for 6 weeks or until released by you surgeon.  °Do not drive while taking narcotics. ° °WEIGHT BEARING °Weight bearing as tolerated with assist device (walker, cane, etc) as directed, use it as long as suggested by your surgeon or therapist, typically at least 4-6 weeks. ° °POSTOPERATIVE CONSTIPATION PROTOCOL °Constipation - defined medically as fewer than three stools per week and severe constipation as less than one stool per week. ° °One of the most common issues patients have following surgery is constipation.  Even if you have a regular bowel pattern at home, your normal regimen is likely to be disrupted due to multiple reasons following surgery.  Combination of anesthesia, postoperative narcotics, change in appetite and fluid intake all can affect your bowels.  In order to avoid complications following surgery, here are some recommendations in order to help you during your recovery period. ° °  Colace (docusate) - Pick up an over-the-counter form of Colace or another stool softener and take twice a day as long as you are requiring postoperative pain medications.  Take with a full glass of water daily.  If you experience loose stools or diarrhea, hold the colace until you stool forms back up.  If your symptoms do not get better within 1 week or if they get worse, check with your  doctor. ° °Dulcolax (bisacodyl) - Pick up over-the-counter and take as directed by the product packaging as needed to assist with the movement of your bowels.  Take with a full glass of water.  Use this product as needed if not relieved by Colace only.  ° °MiraLax (polyethylene glycol) - Pick up over-the-counter to have on hand.  MiraLax is a solution that will increase the amount of water in your bowels to assist with bowel movements.  Take as directed and can mix with a glass of water, juice, soda, coffee, or tea.  Take if you go more than two days without a movement. °Do not use MiraLax more than once per day. Call your doctor if you are still constipated or irregular after using this medication for 7 days in a row. ° °If you continue to have problems with postoperative constipation, please contact the office for further assistance and recommendations.  If you experience "the worst abdominal pain ever" or develop nausea or vomiting, please contact the office immediatly for further recommendations for treatment. ° °ITCHING ° If you experience itching with your medications, try taking only a single pain pill, or even half a pain pill at a time.  You can also use Benadryl over the counter for itching or also to help with sleep.  ° °TED HOSE STOCKINGS °Wear the elastic stockings on both legs for three weeks following surgery during the day but you may remove then at night for sleeping. ° °MEDICATIONS °See your medication summary on the “After Visit Summary” that the nursing staff will review with you prior to discharge.  You may have some home medications which will be placed on hold until you complete the course of blood thinner medication.  It is important for you to complete the blood thinner medication as prescribed by your surgeon.  Continue your approved medications as instructed at time of discharge. ° °PRECAUTIONS °If you experience chest pain or shortness of breath - call 911 immediately for transfer to the  hospital emergency department.  °If you develop a fever greater that 101 F, purulent drainage from wound, increased redness or drainage from wound, foul odor from the wound/dressing, or calf pain - CONTACT YOUR SURGEON.   °                                                °FOLLOW-UP APPOINTMENTS °Make sure you keep all of your appointments after your operation with your surgeon and caregivers. You should call the office at the above phone number and make an appointment for approximately two weeks after the date of your surgery or on the date instructed by your surgeon outlined in the "After Visit Summary". ° ° °RANGE OF MOTION AND STRENGTHENING EXERCISES  °Rehabilitation of the knee is important following a knee injury or an operation. After just a few days of immobilization, the muscles of the thigh which control the knee become weakened and   shrink (atrophy). Knee exercises are designed to build up the tone and strength of the thigh muscles and to improve knee motion. Often times heat used for twenty to thirty minutes before working out will loosen up your tissues and help with improving the range of motion but do not use heat for the first two weeks following surgery. These exercises can be done on a training (exercise) mat, on the floor, on a table or on a bed. Use what ever works the best and is most comfortable for you Knee exercises include:  Leg Lifts - While your knee is still immobilized in a splint or cast, you can do straight leg raises. Lift the leg to 60 degrees, hold for 3 sec, and slowly lower the leg. Repeat 10-20 times 2-3 times daily. Perform this exercise against resistance later as your knee gets better.  Quad and Hamstring Sets - Tighten up the muscle on the front of the thigh (Quad) and hold for 5-10 sec. Repeat this 10-20 times hourly. Hamstring sets are done by pushing the foot backward against an object and holding for 5-10 sec. Repeat as with quad sets.   Leg Slides: Lying on your back,  slowly slide your foot toward your buttocks, bending your knee up off the floor (only go as far as is comfortable). Then slowly slide your foot back down until your leg is flat on the floor again.  Angel Wings: Lying on your back spread your legs to the side as far apart as you can without causing discomfort.  A rehabilitation program following serious knee injuries can speed recovery and prevent re-injury in the future due to weakened muscles. Contact your doctor or a physical therapist for more information on knee rehabilitation.   IF YOU ARE TRANSFERRED TO A SKILLED REHAB FACILITY If the patient is transferred to a skilled rehab facility following release from the hospital, a list of the current medications will be sent to the facility for the patient to continue.  When discharged from the skilled rehab facility, please have the facility set up the patient's [place] prior to being released. Also, the skilled facility will be responsible for providing the patient with their medications at time of release from the facility to include their pain medication, the muscle relaxants, and their blood thinner medication. If the patient is still at the rehab facility at time of the two week follow up appointment, the skilled rehab facility will also need to assist the patient in arranging follow up appointment in our office and any transportation needs.  MAKE SURE YOU:  Understand these instructions.  Get help right away if you are not doing well or get worse.    Pick up stool softner and laxative for home use following surgery while on pain medications. Do not submerge incision under water. Please use good hand washing techniques while changing dressing each day. May shower starting three days after surgery. Please use a clean towel to pat the incision dry following showers. Continue to use ice for pain and swelling after surgery. Do not use any lotions or creams on the incision  until instructed by your surgeon.  Information on my medicine - ELIQUIS (apixaban)  This medication education was reviewed with me or my healthcare representative as part of my discharge preparation.  The pharmacist that spoke with me during my hospital stay was:  Why was Eliquis prescribed for you? Eliquis was prescribed for you to reduce the risk of blood clots forming  after orthopedic surgery.    What do You need to know about Eliquis? Take your Eliquis TWICE DAILY - one tablet in the morning and one tablet in the evening with or without food.  It would be best to take the dose about the same time each day.  If you have difficulty swallowing the tablet whole please discuss with your pharmacist how to take the medication safely.  Take Eliquis exactly as prescribed by your doctor and DO NOT stop taking Eliquis without talking to the doctor who prescribed the medication.  Stopping without other medication to take the place of Eliquis may increase your risk of developing a clot.  After discharge, you should have regular check-up appointments with your healthcare provider that is prescribing your Eliquis.  What do you do if you miss a dose? If a dose of ELIQUIS is not taken at the scheduled time, take it as soon as possible on the same day and twice-daily administration should be resumed.  The dose should not be doubled to make up for a missed dose.  Do not take more than one tablet of ELIQUIS at the same time.  Important Safety Information A possible side effect of Eliquis is bleeding. You should call your healthcare provider right away if you experience any of the following: ? Bleeding from an injury or your nose that does not stop. ? Unusual colored urine (red or dark brown) or unusual colored stools (red or black). ? Unusual bruising for unknown reasons. ? A serious fall or if you hit your head (even if there is no bleeding).  Some medicines may interact with Eliquis and  might increase your risk of bleeding or clotting while on Eliquis. To help avoid this, consult your healthcare provider or pharmacist prior to using any new prescription or non-prescription medications, including herbals, vitamins, non-steroidal anti-inflammatory drugs (NSAIDs) and supplements.  This website has more information on Eliquis (apixaban): http://[email or web address].com/eliquis/home

## 2018-02-28 NOTE — Op Note (Signed)
OPERATIVE REPORT-TOTAL KNEE ARTHROPLASTY   Pre-operative diagnosis- Osteoarthritis  Left knee(s)  Post-operative diagnosis- Osteoarthritis Left knee(s)  Procedure-  Left  Total Knee Arthroplasty  Surgeon- Jesse Plover. Steadman Prosperi, MD  Assistant- Theresa Duty, PA-C   Anesthesia-  Adductor canal block and spinal  EBL-50 mL   Drains Hemovac  Tourniquet time-  Total Tourniquet Time Documented: Thigh (Left) - 37 minutes Total: Thigh (Left) - 37 minutes     Complications- None  Condition-PACU - hemodynamically stable.   Brief Clinical Note   [name] is a 77 y.o. year old male with end stage OA of his left knee with progressively worsening pain and dysfunction. He has constant pain, with activity and at rest and significant functional deficits with difficulties even with ADLs. He has had extensive non-op management including analgesics, injections of cortisone and viscosupplements, and home exercise program, but remains in significant pain with significant dysfunction. Radiographs show bone on bone arthritis lateral and patellofemoral with significant valgus deformity. He presents now for left Total Knee Arthroplasty.    Procedure in detail---   The patient is brought into the operating room and positioned supine on the operating table. After successful administration of  Adductor canal block and spinal,   a tourniquet is placed high on the  Left thigh(s) and the lower extremity is prepped and draped in the usual sterile fashion. Time out is performed by the operating team and then the  Left lower extremity is wrapped in Esmarch, knee flexed and the tourniquet inflated to 300 mmHg.       A midline incision is made with a ten blade through the subcutaneous tissue to the level of the extensor mechanism. A fresh blade is used to make a medial parapatellar arthrotomy. Soft tissue over the proximal medial tibia is subperiosteally elevated to the joint line with a knife and into the  semimembranosus bursa with a Cobb elevator. Soft tissue over the proximal lateral tibia is elevated with attention being paid to avoiding the patellar tendon on the tibial tubercle. The patella is everted, knee flexed 90 degrees and the ACL and PCL are removed. Findings are bone on bone lateral and patellofemoral with large global osteophytes.        The drill is used to create a starting hole in the distal femur and the canal is thoroughly irrigated with sterile saline to remove the fatty contents. The 5 degree Left  valgus alignment guide is placed into the femoral canal and the distal femoral cutting block is pinned to remove 10 mm off the distal femur. Resection is made with an oscillating saw.      The tibia is subluxed forward and the menisci are removed. The extramedullary alignment guide is placed referencing proximally at the medial aspect of the tibial tubercle and distally along the second metatarsal axis and tibial crest. The block is pinned to remove 61mm off the more deficient lateral  side. Resection is made with an oscillating saw. Size 5is the most appropriate size for the tibia and the proximal tibia is prepared with the modular drill and keel punch for that size.      The femoral sizing guide is placed and size 4 is most appropriate. Rotation is marked off the epicondylar axis and confirmed by creating a rectangular flexion gap at 90 degrees. The size 4 cutting block is pinned in this rotation and the anterior, posterior and chamfer cuts are made with the oscillating saw. The intercondylar block is then placed and  that cut is made.      Trial size 5 tibial component, trial size 4 posterior stabilized femur and a 12.5  mm posterior stabilized rotating platform insert trial is placed. Full extension is achieved with excellent varus/valgus and anterior/posterior balance throughout full range of motion. The patella is everted and thickness measured to be 27  mm. Free hand resection is taken to 15  mm, a 41 template is placed, lug holes are drilled, trial patella is placed, and it tracks normally. Osteophytes are removed off the posterior femur with the trial in place. All trials are removed and the cut bone surfaces prepared with pulsatile lavage. Cement is mixed and once ready for implantation, the size 5 tibial implant, size  4 posterior stabilized femoral component, and the size 41 patella are cemented in place and the patella is held with the clamp. The trial insert is placed and the knee held in full extension. The Exparel (20 ml mixed with 60 ml saline) is injected into the extensor mechanism, posterior capsule, medial and lateral gutters and subcutaneous tissues.  All extruded cement is removed and once the cement is hard the permanent 12.5 mm posterior stabilized rotating platform insert is placed into the tibial tray.      The wound is copiously irrigated with saline solution and the extensor mechanism closed over a hemovac drain with #1 V-loc suture. The tourniquet is released for a total tourniquet time of 37  minutes. Flexion against gravity is 140 degrees and the patella tracks normally. Subcutaneous tissue is closed with 2.0 vicryl and subcuticular with running 4.0 Monocryl. The incision is cleaned and dried and steri-strips and a bulky sterile dressing are applied. The limb is placed into a knee immobilizer and the patient is awakened and transported to recovery in stable condition.      Please note that a surgical assistant was a medical necessity for this procedure in order to perform it in a safe and expeditious manner. Surgical assistant was necessary to retract the ligaments and vital neurovascular structures to prevent injury to them and also necessary for proper positioning of the limb to allow for anatomic placement of the prosthesis.   Jesse Plover Renarda Mullinix, MD    02/28/2018, 3:10 PM

## 2018-02-28 NOTE — Interval H&P Note (Signed)
History and Physical Interval Note:  02/28/2018 11:27 AM  [name]  has presented today for surgery, with the diagnosis of left knee osteoarthritis  The various methods of treatment have been discussed with the patient and family. After consideration of risks, benefits and other options for treatment, the patient has consented to  Procedure(s): LEFT TOTAL KNEE ARTHROPLASTY (Left) as a surgical intervention .  The patient's history has been reviewed, patient examined, no change in status, stable for surgery.  I have reviewed the patient's chart and labs.  Questions were answered to the patient's satisfaction.     Pilar Plate Eesa Justiss

## 2018-02-28 NOTE — Anesthesia Postprocedure Evaluation (Signed)
Anesthesia Post Note  Patient: [name]  Procedure(s) Performed: LEFT TOTAL KNEE ARTHROPLASTY (Left Knee)     Patient location during evaluation: PACU Anesthesia Type: Spinal Level of consciousness: awake and alert Pain management: pain level controlled Vital Signs Assessment: post-procedure vital signs reviewed and stable Respiratory status: spontaneous breathing and respiratory function stable Cardiovascular status: blood pressure returned to baseline and stable Postop Assessment: spinal receding and no apparent nausea or vomiting Anesthetic complications: no    Last Vitals:  Vitals:   02/28/18 1715 02/28/18 1720  BP:  (!) 161/95  Pulse:  66  Resp:  14  Temp: (!) 36.3 C (!) 36.3 C  SpO2:  98%    Last Pain:  Vitals:   02/28/18 1700  TempSrc:   PainSc: 0-No pain                 Audry Pili

## 2018-02-28 NOTE — Progress Notes (Signed)
AssistedDr. Brock with left, ultrasound guided, adductor canal block. Side rails up, monitors on throughout procedure. See vital signs in flow sheet. Tolerated Procedure well.  

## 2018-02-28 NOTE — Anesthesia Procedure Notes (Signed)
Spinal  Patient location during procedure: OR Start time: 02/28/2018 2:05 PM End time: 02/28/2018 2:10 PM Staffing Anesthesiologist: Audry Pili, MD Performed: anesthesiologist  Preanesthetic Checklist Completed: patient identified, surgical consent, pre-op evaluation, timeout performed, IV checked, risks and benefits discussed and monitors and equipment checked Spinal Block Patient position: sitting Prep: DuraPrep Patient monitoring: heart rate, cardiac monitor, continuous pulse ox and blood pressure Approach: midline Location: L2-3 Injection technique: single-shot Needle Needle type: Pencan  Needle gauge: 24 G Additional Notes Functioning IV was confirmed and monitors were applied. Sterile prep and drape, including hand hygiene, mask, and sterile gloves were used. The patient was positioned and the spine was prepped. The skin was anesthetized with lidocaine. Free flow of clear CSF was obtained prior to injecting local anesthetic into the CSF. The spinal needle aspirated freely following injection. The needle was carefully withdrawn. The patient tolerated the procedure well. Consent was obtained prior to the procedure with all questions answered and concerns addressed. Risks including, but not limited to, bleeding, infection, nerve damage, paralysis, failed block, inadequate analgesia, allergic reaction, high spinal, itching, and headache were discussed and the patient wished to proceed.  Renold Don, MD

## 2018-02-28 NOTE — Anesthesia Preprocedure Evaluation (Addendum)
Anesthesia Evaluation  Patient identified by MRN, date of birth, ID band Patient awake    Reviewed: Allergy & Precautions, NPO status , Patient's Chart, lab work & pertinent test results  History of Anesthesia Complications Negative for: history of anesthetic complications  Airway Mallampati: II  TM Distance: >3 FB Neck ROM: Full    Dental  (+) Teeth Intact, Chipped,    Pulmonary neg pulmonary ROS,    breath sounds clear to auscultation       Cardiovascular hypertension, Pt. on medications + CAD, + Cardiac Stents (BMS in RCA), + Peripheral Vascular Disease and +CHF  + dysrhythmias Atrial Fibrillation and Ventricular Tachycardia  Rhythm:Irregular Rate:Bradycardia   '19 TTE - Mild concentric LVH. EF 50% to 55%. Mild AI, severely dilated LA. Pacer wire or catheter noted in RA and RV. RA was moderately dilated. A mild pericardial effusion was identified. Features were not consistent with tamponade physiology.    Neuro/Psych negative neurological ROS  negative psych ROS   GI/Hepatic negative GI ROS, Neg liver ROS,   Endo/Other  negative endocrine ROS  Renal/GU negative Renal ROS  negative genitourinary   Musculoskeletal  (+) Arthritis ,   Abdominal   Peds  Hematology  (+) anemia ,   Anesthesia Other Findings   Reproductive/Obstetrics                           Anesthesia Physical Anesthesia Plan  ASA: III  Anesthesia Plan: Spinal   Post-op Pain Management:  Regional for Post-op pain   Induction:   PONV Risk Score and Plan: 1 and Treatment may vary due to age or medical condition and Propofol infusion  Airway Management Planned: Natural Airway and Simple Face Mask  Additional Equipment: None  Intra-op Plan:   Post-operative Plan:   Informed Consent: I have reviewed the patients History and Physical, chart, labs and discussed the procedure including the risks, benefits and  alternatives for the proposed anesthesia with the patient or authorized representative who has indicated his/her understanding and acceptance.     Plan Discussed with: CRNA and Anesthesiologist  Anesthesia Plan Comments: (Labs reviewed, platelets acceptable. Discussed risks and benefits of spinal, including spinal/epidural hematoma, infection, failed block, and PDPH. Patient expressed understanding and wished to proceed. )      Anesthesia Quick Evaluation

## 2018-03-01 ENCOUNTER — Encounter (HOSPITAL_COMMUNITY): Payer: Self-pay | Admitting: Orthopedic Surgery

## 2018-03-01 LAB — BASIC METABOLIC PANEL
Anion gap: 10 (ref 5–15)
BUN: 13 mg/dL (ref 8–23)
CHLORIDE: 105 mmol/L (ref 98–111)
CO2: 26 mmol/L (ref 22–32)
CREATININE: 0.88 mg/dL (ref 0.61–1.24)
Calcium: 8.5 mg/dL — ABNORMAL LOW (ref 8.9–10.3)
GFR calc Af Amer: >60 mL/min (ref >60)
GFR calc non Af Amer: >60 mL/min (ref >60)
GLUCOSE: 163 mg/dL — AB (ref 70–99)
POTASSIUM: 4.3 mmol/L (ref 3.5–5.1)
Sodium: 141 mmol/L (ref 135–145)

## 2018-03-01 LAB — CBC
HEMATOCRIT: 29.7 % — AB (ref 39.0–52.0)
HEMOGLOBIN: 9.8 g/dL — AB (ref 13.0–17.0)
MCH: 29.7 pg (ref 26.0–34.0)
MCHC: 33 g/dL (ref 30.0–36.0)
MCV: 90 fL (ref 78.0–100.0)
Platelets: 142 10*3/uL — ABNORMAL LOW (ref 150–400)
RBC: 3.3 MIL/uL — AB (ref 4.22–5.81)
RDW: 13.7 % (ref 11.5–15.5)
WBC: 9.7 10*3/uL (ref 4.0–10.5)

## 2018-03-01 MED ORDER — TRAMADOL HCL 50 MG PO TABS: 50.0000 mg | ORAL_TABLET | Freq: Four times a day (QID) | ORAL | Status: DC | PRN

## 2018-03-01 NOTE — Progress Notes (Signed)
Patient constantly getting out of bed. Patient again non-compliant with safety measures.   Patient states "I want to see the Dr's order that states that I have to stay in the bed, Im prisoner here. I cant move at all." RN again educated patient about safety precautions, and the importance of calling if needing assistance out of bed.  Patient states "I just want to put my pajamas on"  RN has requested that the patient call any time he needs assistance. Patient states "I don't want to bother you." RN again states that we are here to help you.   RN writing this note to document the safety measures that are in place to keep patient from falling, and the amount of education that has been communicated with the patient and family.

## 2018-03-01 NOTE — Progress Notes (Addendum)
   Subjective: 1 Day Post-Op Procedure(s) (LRB): LEFT TOTAL KNEE ARTHROPLASTY (Left) Patient reports pain as mild.   Patient seen in rounds by Dr. Wynelle Link. Patient is well, and has had no acute complaints or problems other than pain in the left knee. Foley catheter to be removed this AM. Denies chest pain, SOB, or calf pain.  We will start therapy today.   Objective: Vital signs in last 24 hours: Temp:  [97.4 F (36.3 C)-98.2 F (36.8 C)] 97.7 F (36.5 C) (08/13 0533) Pulse Rate:  [27-89] 59 (08/13 0533) Resp:  [10-23] 16 (08/13 0533) BP: (123-184)/(74-105) 123/74 (08/13 0533) SpO2:  [97 %-100 %] 99 % (08/13 0533) Weight:  [69.2 kg] 69.2 kg (08/12 1121)  Intake/Output from previous day:  Intake/Output Summary (Last 24 hours) at 03/01/2018 0727 Last data filed at 03/01/2018 0600 Gross per 24 hour  Intake 3260.93 ml  Output 2225 ml  Net 1035.93 ml     Labs: Recent Labs    03/01/18 0521  HGB 9.8*   Recent Labs    03/01/18 0521  WBC 9.7  RBC 3.30*  HCT 29.7*  PLT 142*   Recent Labs    03/01/18 0521  NA 141  K 4.3  CL 105  CO2 26  BUN 13  CREATININE 0.88  GLUCOSE 163*  CALCIUM 8.5*    Exam: General - Patient is Alert and Oriented Extremity - Neurologically intact Neurovascular intact Sensation intact distally Dorsiflexion/Plantar flexion intact Dressing - dressing C/D/I Motor Function - intact, moving foot and toes well on exam.   Past Medical History:  Diagnosis Date  . CAD (coronary artery disease)    s/p bare metal stend to RCA  . Chronic diastolic CHF (congestive heart failure) ([place])   . Chronic venous insufficiency   . HTN (hypertension)   . PAF (paroxysmal atrial fibrillation) ([facility])    Not on long term anticoagluation despite CHADS2VASC score of 4 due to patient refusal because he likes to ski and does not want to risk a fall with bleeding complications.  He understands his cardioembolic risk associated with PAF.   Marland Kitchen Pneumothorax   .  Ventricular tachycardia ([facility])    possibly proarrhythmia from flecainide    Assessment/Plan: 1 Day Post-Op Procedure(s) (LRB): LEFT TOTAL KNEE ARTHROPLASTY (Left) Principal Problem:   OA (osteoarthritis) of knee  Estimated body mass index is 19.58 kg/m as calculated from the following:   Height as of this encounter: 6\' 2"  (1.88 m).   Weight as of this encounter: 69.2 kg. Advance diet Up with therapy  Anticipated LOS equal to or greater than 2 midnights due to - Age 79 and older with one or more of the following:  - Obesity  - Expected need for hospital services (PT, OT, Nursing) required for safe  discharge  - Anticipated need for postoperative skilled nursing care or inpatient rehab  - Active co-morbidities: Coronary Artery Disease, Heart Failure and Cardiac Arrhythmia OR   - Unanticipated findings during/Post Surgery: None  - Patient is a high risk of re-admission due to: None    DVT Prophylaxis - Eliquis Weight bearing as tolerated. D/C O2 and pulse ox and try on room air. Hemovac pulled without difficulty, will begin therapy today.  Plan is to go Home after hospital stay. Possible discharge tomorrow with outpatient PT if meeting goals.   Theresa Duty, PA-C Orthopedic Surgery 03/01/2018, 7:27 AM

## 2018-03-01 NOTE — Progress Notes (Signed)
Pt family concerned that the video physical therapy will not work for pt. They are also concerned he is overall not doing what he is being advised to by nursing staff as well. He will not keep his leg straight and is very impulsive with behavior in general. They feel like Dr. Wynelle Link needs to reinforce what the patient needs to do with patient.

## 2018-03-01 NOTE — Progress Notes (Addendum)
Pt's has schedule Pindolol 10mg  at 2200, BP is 142/86, HR of 55 apical pulse, paged ortho on call if its ok to give medicine and ordered to give it; also informed about the HemoVac that was pulled accidentally by pt, noticed scant drainage from the dressing. No active bleeding. No new Order at this time; Will continue to monitor pt.

## 2018-03-01 NOTE — Progress Notes (Signed)
Physical Therapy Treatment Patient Details Name: [name] MRN: [id number] DOB: [birth year] Today's Date: 03/01/2018    History of Present Illness L tka,. H/O Afib, cardioversion 12/23/17. 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 Requires frequent cues for safety.Did participate in there EXer.. Daughter present. Continue PT until safe for DC.  Follow Up Recommendations  Follow surgeon's recommendation for DC plan and follow-up therapies(virtual)     Equipment Recommendations  None recommended by PT    Recommendations for Other Services       Precautions / Restrictions Precautions Precautions: Fall Precaution Comments: impulsive, safety Restrictions Weight Bearing Restrictions: No    Mobility  Bed Mobility Overal bed mobility: Needs Assistance Bed Mobility: Supine to Sit     Supine to sit: Min guard     General bed mobility comments: OOB in recliner upon arrival   Transfers Overall transfer level: Needs assistance Equipment used: Rolling walker (2 wheeled) Transfers: Sit to/from Stand Sit to Stand: Min assist         General transfer comment: declined to ambulate  Ambulation/Gait                 Stairs             Wheelchair Mobility    Modified Rankin (Stroke Patients Only)       Balance Overall balance assessment: Needs assistance Sitting-balance support: No upper extremity supported;Feet supported Sitting balance-Leahy Scale: Good     Standing balance support: During functional activity;Bilateral upper extremity supported Standing balance-Leahy Scale: Fair                              Cognition Arousal/Alertness: Awake/alert Behavior During Therapy: Impulsive;Restless Overall Cognitive Status: Impaired/Different from baseline Area of Impairment: Safety/judgement;Awareness                         Safety/Judgement: Decreased awareness of  safety;Decreased awareness of deficits Awareness: Emergent   General Comments: pt requires consistent cues for safety during all mobility tasks and redirection       Exercises Total Joint Exercises Ankle Circles/Pumps: AROM;10 reps;Both;Supine Quad Sets: AROM;Left;10 reps;Supine Short Arc Quad: AAROM;Left;10 reps;Supine Hip ABduction/ADduction: AAROM;Left;10 reps;Supine Straight Leg Raises: AROM;Left;10 reps;Supine    General Comments        Pertinent Vitals/Pain Pain Assessment: Faces Faces Pain Scale: Hurts whole lot Pain Location: L knee  Pain Descriptors / Indicators: Aching;Discomfort;Grimacing;Guarding;Tightness Pain Intervention(s): Monitored during session;Premedicated before session;Repositioned;Ice applied    Home Living Family/patient expects to be discharged to:: Private residence Living Arrangements: Children(daughter to stay the first week ) Available Help at Discharge: Family Type of Home: House Home Access: Level entry   Home Layout: Multi-level;Able to live on main level with bedroom/bathroom Home Equipment: Gilford Rile - 2 wheels;Bedside commode      Prior Function Level of Independence: Independent          PT Goals (current goals can now be found in the care plan section) Acute Rehab PT Goals Patient Stated Goal: to walk, go home Progress towards PT goals: Progressing toward goals    Frequency    7X/week      PT Plan      Co-evaluation              AM-PAC PT "6 Clicks" Daily Activity  Outcome Measure  Difficulty turning over in bed (  including adjusting bedclothes, sheets and blankets)?: A Little Difficulty moving from lying on back to sitting on the side of the bed? : A Little Difficulty sitting down on and standing up from a chair with arms (e.g., wheelchair, bedside commode, etc,.)?: A Lot Help needed moving to and from a bed to chair (including a wheelchair)?: A Lot Help needed walking in hospital room?: A Lot Help needed  climbing 3-5 steps with a railing? : Total 6 Click Score: 13    End of Session   Activity Tolerance: Patient limited by pain Patient left: in chair;with call bell/phone within reach;with chair alarm set;with family/visitor present Nurse Communication: Mobility status PT Visit Diagnosis: Unsteadiness on feet (R26.81);Pain Pain - Right/Left: Left Pain - part of body: Knee     Time: 5686-1683 PT Time Calculation (min) (ACUTE ONLY): 16 min  Charges:  $Therapeutic Exercise: 8-22 mins                     [place] [facility] [phone]    [name] 03/01/2018, 4:40 PM

## 2018-03-01 NOTE — Plan of Care (Signed)
Pt stable though remains very uncooperative and does not obey commands. He still gets out of bed with bed alarm frequently going off. Pt also pulling off ace wrap and refuses scds/ teds. Pt will not work with nursing staff overall.

## 2018-03-01 NOTE — Progress Notes (Signed)
Patient trying multiple times to get OOB between 7 and 7:30. Bed alarm going off.  Rn and NT present to the room and ask patient what he needs at this time. Patient stated that he wanted to get dressed. I educated patient about the importance of not getting out of bed alone because we do not want him to fall.   RN also stated that I would help him get dressed once I came back to do his assessment.   Patient non-compliant with safety measures including: bed alarm, chair alarm, leaving feet elevated, not having feet down out of the bed, or while in the chair.   Daughter arrived to room at 11am, and reinforced teaching with patient and daughter at this time.

## 2018-03-01 NOTE — Evaluation (Signed)
Occupational Therapy Evaluation Patient Details Name: [name] MRN: [id number] DOB: 05-22-1941 Today's Date: 03/01/2018    History of Present Illness L tka,. H/O Afib, cardioversion 12/23/17. 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 At baseline pt is independent with ADLs and functional mobility. Pt presenting with increased pain, decreased dynamic balance and functional performance. Pt requiring minA for room level mobility using RW. Currently requires minA for standing grooming ADLs, min-modA for LB ADLs. Pt demonstrates decreased memory and safety awareness during this session, requiring frequent cues during functional tasks and mobility. Pt will benefit from continued OT services while in acute setting to maximize his overall safety and independence with ADLs and mobility. Will follow.     Follow Up Recommendations  Follow surgeon's recommendation for DC plan and follow-up therapies;Supervision/Assistance - 24 hour    Equipment Recommendations  None recommended by OT           Precautions / Restrictions Precautions Precautions: Fall Precaution Comments: impulsive Restrictions Weight Bearing Restrictions: No      Mobility Bed Mobility Overal bed mobility: Needs Assistance Bed Mobility: Supine to Sit     Supine to sit: Min guard     General bed mobility comments: OOB in recliner upon arrival   Transfers Overall transfer level: Needs assistance Equipment used: Rolling walker (2 wheeled) Transfers: Sit to/from Stand Sit to Stand: Min assist         General transfer comment: cues for safety and sequence    Balance Overall balance assessment: Needs assistance Sitting-balance support: No upper extremity supported;Feet supported Sitting balance-Leahy Scale: Good     Standing balance support: During functional activity;Bilateral upper extremity supported Standing balance-Leahy Scale: Fair                              ADL either performed or assessed with clinical judgement   ADL Overall ADL's : Needs assistance/impaired Eating/Feeding: Set up;Sitting   Grooming: Minimal assistance;Standing;Wash/dry hands Grooming Details (indicate cue type and reason): minA standing balance  Upper Body Bathing: Min guard;Sitting   Lower Body Bathing: Minimal assistance;Sit to/from stand   Upper Body Dressing : Set up;Sitting   Lower Body Dressing: Minimal assistance;Sit to/from stand Lower Body Dressing Details (indicate cue type and reason): requires assist to don L shoe, educated on donning operative LE first when completing dressing task, educated on safety during task as pt initially attempting to stand to slide foot into shoe and doing so without RW in front of him  Toilet Transfer: Minimal assistance;Ambulation;BSC;RW Toilet Transfer Details (indicate cue type and reason): over toilet; simulated in transfer to/from [place] and Hygiene: Minimal assistance;Sit to/from Nurse, children's Details (indicate cue type and reason): daughter present end of session and educating pt and daughter on use of 3:1 as shower chair during bathing task initially for safety, will plan to practice transfer during following tx session Functional mobility during ADLs: Minimal assistance;Rolling walker General ADL Comments: began education regarding safety and compensatory strategies for completing ADLs; pt requires frequent safety cues during functional tasks                          Pertinent Vitals/Pain Pain Assessment: Faces Pain Score: 6  Faces Pain Scale: Hurts even more Pain Location: L knee  Pain Descriptors / Indicators: Aching;Discomfort;Grimacing;Guarding;Tightness  Pain Intervention(s): Monitored during session;Repositioned;Ice applied     Hand Dominance     Extremity/Trunk Assessment Upper Extremity Assessment Upper Extremity  Assessment: Overall WFL for tasks assessed   Lower Extremity Assessment Lower Extremity Assessment: Defer to PT evaluation LLE Deficits / Details: 10-60 knee flexion   Cervical / Trunk Assessment Cervical / Trunk Assessment: Kyphotic   Communication Communication Communication: No difficulties   Cognition Arousal/Alertness: Awake/alert Behavior During Therapy: Impulsive;Restless Overall Cognitive Status: Impaired/Different from baseline Area of Impairment: Safety/judgement;Awareness                         Safety/Judgement: Decreased awareness of safety;Decreased awareness of deficits Awareness: Emergent   General Comments: pt requires consistent cues for safety during all mobility tasks and redirection    General Comments       Exercises Total Joint Exercises Ankle Circles/Pumps: AROM;10 reps;Both Quad Sets: AROM;Left;10 reps Hip ABduction/ADduction: AROM;Left;10 reps;Supine Straight Leg Raises: AROM;Left;10 reps;Supine Long Arc Quad: AROM;Seated;Left;10 reps Knee Flexion: AROM;Seated;Left;10 reps   Shoulder Instructions      Home Living Family/patient expects to be discharged to:: Private residence Living Arrangements: Children(daughter to stay the first week ) Available Help at Discharge: Family Type of Home: House Home Access: Level entry     Home Layout: Multi-level;Able to live on main level with bedroom/bathroom     Bathroom Shower/Tub: Occupational psychologist: Standard     Home Equipment: Environmental consultant - 2 wheels;Bedside commode          Prior Functioning/Environment Level of Independence: Independent                 OT Problem List: Decreased strength;Impaired balance (sitting and/or standing);Decreased cognition;Decreased knowledge of precautions;Pain;Decreased safety awareness;Decreased activity tolerance;Decreased range of motion      OT Treatment/Interventions: Self-care/ADL training;DME and/or AE instruction;Therapeutic  activities;Balance training;Therapeutic exercise;Patient/family education    OT Goals(Current goals can be found in the care plan section) Acute Rehab OT Goals Patient Stated Goal: to walk, go home OT Goal Formulation: With patient Time For Goal Achievement: 03/15/18 Potential to Achieve Goals: Good  OT Frequency: Min 2X/week   Barriers to D/C:            Co-evaluation              AM-PAC PT "6 Clicks" Daily Activity     Outcome Measure Help from another person eating meals?: None Help from another person taking care of personal grooming?: A Little Help from another person toileting, which includes using toliet, bedpan, or urinal?: A Little Help from another person bathing (including washing, rinsing, drying)?: A Lot Help from another person to put on and taking off regular upper body clothing?: None Help from another person to put on and taking off regular lower body clothing?: A Lot 6 Click Score: 18   End of Session Equipment Utilized During Treatment: Gait belt;Rolling walker  Activity Tolerance: Patient tolerated treatment well Patient left: in chair;with call bell/phone within reach;with chair alarm set;with family/visitor present  OT Visit Diagnosis: Other abnormalities of gait and mobility (R26.89);Pain Pain - Right/Left: Left Pain - part of body: Knee                Time: 1700-1749 OT Time Calculation (min): 27 min Charges:  OT General Charges $OT Visit: 1 Visit OT Evaluation $OT Eval Low Complexity: 1 Low OT Treatments $Self Care/Home Management : 8-22 mins  Lou Cal, OT Pager [phone] 03/01/2018   Bubba Hales  Lorinda Creed 03/01/2018, 2:12 PM

## 2018-03-01 NOTE — Evaluation (Signed)
Physical Therapy Evaluation Patient Details Name: [name] MRN: [id number] DOB: 1940/11/03 Today's Date: 03/01/2018   History of Present Illness  L tka,. H/O Afib, cardioversion 12/23/17. 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 Patient plans to  Have family assist after Dc. Pt admitted with above diagnosis. Pt currently with functional limitations due to the deficits listed below (see PT Problem List).  Pt will benefit from skilled PT to increase their independence and safety with mobility to allow discharge to the venue listed below.  Patient self caths.     Follow Up Recommendations Follow surgeon's recommendation for DC plan and follow-up therapies([facility])    Equipment Recommendations  None recommended by PT    Recommendations for Other Services   OT    Precautions / Restrictions Precautions Precautions: Fall Precaution Comments: impulsive Restrictions Weight Bearing Restrictions: No      Mobility  Bed Mobility Overal bed mobility: Needs Assistance Bed Mobility: Supine to Sit     Supine to sit: Min guard     General bed mobility comments: cues for technique, safety,  Transfers Overall transfer level: Needs assistance Equipment used: Rolling walker (2 wheeled) Transfers: Sit to/from Stand Sit to Stand: Min assist         General transfer comment: cues for safety and sequence  Ambulation/Gait Ambulation/Gait assistance: Min assist Gait Distance (Feet): 100 Feet Assistive device: Rolling walker (2 wheeled) Gait Pattern/deviations: Step-to pattern;Step-through pattern     General Gait Details: cues for sequence and safety  Stairs            Wheelchair Mobility    Modified Rankin (Stroke Patients Only)       Balance Overall balance assessment: Needs assistance Sitting-balance support: No upper extremity supported;Feet supported Sitting  balance-Leahy Scale: Good     Standing balance support: During functional activity;Bilateral upper extremity supported Standing balance-Leahy Scale: Fair                               Pertinent Vitals/Pain Pain Assessment: 0-10 Pain Score: 6  Pain Location: back of the knee Pain Descriptors / Indicators: Aching;Discomfort;Grimacing;Guarding;Tightness Pain Intervention(s): Limited activity within patient's tolerance;Monitored during session;Premedicated before session;Repositioned;Ice applied    Home Living Family/patient expects to be discharged to:: Private residence Living Arrangements: Children Available Help at Discharge: Family Type of Home: House Home Access: Level entry     Home Layout: Multi-level;Able to live on main level with bedroom/bathroom Home Equipment: Gilford Rile - 2 wheels;Bedside commode      Prior Function Level of Independence: Independent               Hand Dominance        Extremity/Trunk Assessment   Upper Extremity Assessment Upper Extremity Assessment: Overall WFL for tasks assessed    Lower Extremity Assessment Lower Extremity Assessment: LLE deficits/detail LLE Deficits / Details: 10-60 knee flexion    Cervical / Trunk Assessment Cervical / Trunk Assessment: Kyphotic  Communication   Communication: No difficulties  Cognition Arousal/Alertness: Awake/alert Behavior During Therapy: Impulsive;Restless Overall Cognitive Status: No family/caregiver present to determine baseline cognitive functioning Area of Impairment: Safety/judgement                         Safety/Judgement: Decreased awareness of safety     General Comments: constantly  putting  recliner   leg rest up/down, cues for safety, frequentr redirection to stay on task. Unable to complete exercises due to patient distracting      General Comments      Exercises Total Joint Exercises Ankle Circles/Pumps: AROM;10 reps;Both Quad Sets:  AROM;Left;10 reps Hip ABduction/ADduction: AROM;Left;10 reps;Supine Straight Leg Raises: AROM;Left;10 reps;Supine Long Arc Quad: AROM;Seated;Left;10 reps Knee Flexion: AROM;Seated;Left;10 reps   Assessment/Plan    PT Assessment Patient needs continued PT services  PT Problem List Decreased strength;Decreased range of motion;Decreased cognition;Decreased activity tolerance;Decreased knowledge of use of DME;Decreased safety awareness;Decreased mobility;Decreased knowledge of precautions;Pain       PT Treatment Interventions DME instruction;Gait training;Therapeutic exercise;Functional mobility training;Therapeutic activities;Patient/family education;Stair training    PT Goals (Current goals can be found in the Care Plan section)  Acute Rehab PT Goals Patient Stated Goal: to walk, go home PT Goal Formulation: With patient Time For Goal Achievement: 03/04/18 Potential to Achieve Goals: Good    Frequency 7X/week   Barriers to discharge        Co-evaluation               AM-PAC PT "6 Clicks" Daily Activity  Outcome Measure Difficulty turning over in bed (including adjusting bedclothes, sheets and blankets)?: A Little Difficulty moving from lying on back to sitting on the side of the bed? : A Little Difficulty sitting down on and standing up from a chair with arms (e.g., wheelchair, bedside commode, etc,.)?: A Lot Help needed moving to and from a bed to chair (including a wheelchair)?: A Lot Help needed walking in hospital room?: A Lot Help needed climbing 3-5 steps with a railing? : Total 6 Click Score: 13    End of Session Equipment Utilized During Treatment: Gait belt Activity Tolerance: Patient tolerated treatment well Patient left: in chair;with call bell/phone within reach;with chair alarm set Nurse Communication: Mobility status PT Visit Diagnosis: Unsteadiness on feet (R26.81);Pain Pain - Right/Left: Left Pain - part of body: Knee    Time: 4287-6811 PT Time  Calculation (min) (ACUTE ONLY): 34 min   Charges:   PT Evaluation $PT Eval Low Complexity: 1 Low PT Treatments $Gait Training: 8-22 mins        [place] PT [phone]   [name] 03/01/2018, 11:22 AM

## 2018-03-01 NOTE — Progress Notes (Signed)
At 11am, patient found removing surgical ACE wrap. RN was informed in report that patient had done this during their shift as well.  ACE wrap replaced, and patient was again educated not to touch the knee, or dressing, and that if he had concerns to address them with staff.   Patient stated understanding at this time.   RN concerned for patient's memory, as well as judgement related to safety awareness and short term memory.

## 2018-03-01 NOTE — Progress Notes (Signed)
Care Plan Notes 12/01/2017 to 03/01/2018       Care Plan by Leonides Grills A at 02/25/2018  3:43 PM    Date of Service   Author Author Type Status Note Type File Time  02/25/2018  [name]  Signed Care Plan 02/25/2018             L TKA scheduled on 02-28-18 DCP:  Home with dtr DME:  Has a RW and 3-in-1 PT:  Virtual PT

## 2018-03-02 LAB — BASIC METABOLIC PANEL
Anion gap: 7 (ref 5–15)
BUN: 16 mg/dL (ref 8–23)
CALCIUM: 8.3 mg/dL — AB (ref 8.9–10.3)
CO2: 27 mmol/L (ref 22–32)
Chloride: 104 mmol/L (ref 98–111)
Creatinine, Ser: 0.91 mg/dL (ref 0.61–1.24)
GFR calc Af Amer: >60 mL/min (ref >60)
GLUCOSE: 117 mg/dL — AB (ref 70–99)
Potassium: 3.8 mmol/L (ref 3.5–5.1)
SODIUM: 138 mmol/L (ref 135–145)

## 2018-03-02 LAB — CBC
HCT: 27.5 % — ABNORMAL LOW (ref 39.0–52.0)
Hemoglobin: 9.1 g/dL — ABNORMAL LOW (ref 13.0–17.0)
MCH: 29.7 pg (ref 26.0–34.0)
MCHC: 33.1 g/dL (ref 30.0–36.0)
MCV: 89.9 fL (ref 78.0–100.0)
PLATELETS: 135 10*3/uL — AB (ref 150–400)
RBC: 3.06 MIL/uL — ABNORMAL LOW (ref 4.22–5.81)
RDW: 14.1 % (ref 11.5–15.5)
WBC: 10.2 10*3/uL (ref 4.0–10.5)

## 2018-03-02 MED ORDER — ACETAMINOPHEN 325 MG PO TABS
650.0000 mg | ORAL_TABLET | Freq: Four times a day (QID) | ORAL | Status: DC | PRN
  Administered 2018-03-02 – 2018-03-03 (×2): 650 mg via ORAL
  Filled 2018-03-02: qty 2

## 2018-03-02 NOTE — Progress Notes (Signed)
Physical Therapy Treatment Patient Details Name: QAADIR KENT MRN: [id number] DOB: Oct 03, 1940 Today's Date: 03/02/2018    History of Present Illness L tka,. H/O Afib, cardioversion 12/23/17. 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 Concentrated exercises on knee extension. Continue PT. Daughter is present. Deferred to discuss  Rehab options with MD or case manager.   Follow Up Recommendations  Follow surgeon's recommendation for DC plan and follow-up therapies(patient may benefit from OPPT initially for closer guidance of post op rehab.)     Equipment Recommendations  None recommended by PT    Recommendations for Other Services       Precautions / Restrictions Precautions Precautions: Fall Precaution Comments: impulsive, safety Required Braces or Orthoses: Knee Immobilizer - Left Restrictions Weight Bearing Restrictions: No    Mobility  Bed Mobility Overal bed mobility: Needs Assistance Bed Mobility: Supine to Sit     Supine to sit: Min assist     General bed mobility comments: in recliner  Transfers Overall transfer level: Needs assistance Equipment used: Rolling walker (2 wheeled) Transfers: Sit to/from Stand Sit to Stand: Min assist         General transfer comment: steadying assistance; cues for UE/LE placement  Ambulation/Gait Ambulation/Gait assistance: Min assist Gait Distance (Feet): 100 Feet Assistive device: Rolling walker (2 wheeled) Gait Pattern/deviations: Step-to pattern;Step-through pattern Gait velocity: decreased. stopp for break x 1   General Gait Details: cues for sequence and safety   Stairs             Wheelchair Mobility    Modified Rankin (Stroke Patients Only)       Balance                                            Cognition Arousal/Alertness: Awake/alert Behavior During Therapy: Impulsive Overall  Cognitive Status: Impaired/Different from baseline Area of Impairment: Safety/judgement;Awareness                         Safety/Judgement: Decreased awareness of safety;Decreased awareness of deficits Awareness: Emergent   General Comments: patient able to focus on the exercises with frequent cues.      Exercises Total Joint Exercises Ankle Circles/Pumps: AROM;10 reps;Both;Supine Quad Sets: AROM;Left;10 reps;Supine Short Arc Quad: AAROM;Left;10 reps;Supine Heel Slides: AAROM;Left;10 reps;Supine Hip ABduction/ADduction: AAROM;Left;10 reps;Supine Straight Leg Raises: Left;10 reps;Supine;AAROM Long Arc Quad: Seated;Left;10 reps;AAROM Knee Flexion: Seated;Left;10 reps;AAROM Goniometric ROM: 20-50 left knee flexion    General Comments        Pertinent Vitals/Pain Pain Assessment: Faces Pain Score: 5  Faces Pain Scale: Hurts little more Pain Location: left knee Pain Descriptors / Indicators: Discomfort;Sore Pain Intervention(s): Monitored during session;Premedicated before session;Ice applied    Home Living                      Prior Function            PT Goals (current goals can now be found in the care plan section) Progress towards PT goals: Progressing toward goals    Frequency    7X/week      PT Plan Current plan remains appropriate    Co-evaluation              AM-PAC PT "6 Clicks" Daily  Activity  Outcome Measure  Difficulty turning over in bed (including adjusting bedclothes, sheets and blankets)?: A Little Difficulty moving from lying on back to sitting on the side of the bed? : A Little Difficulty sitting down on and standing up from a chair with arms (e.g., wheelchair, bedside commode, etc,.)?: A Lot Help needed moving to and from a bed to chair (including a wheelchair)?: A Lot Help needed walking in hospital room?: A Lot Help needed climbing 3-5 steps with a railing? : Total 6 Click Score: 13    End of Session  Equipment Utilized During Treatment: Gait belt Activity Tolerance: Patient tolerated treatment well Patient left: (with RN in BR) Nurse Communication: Mobility status PT Visit Diagnosis: Unsteadiness on feet (R26.81);Pain Pain - Right/Left: Left Pain - part of body: Knee     Time: 2023-3435 PT Time Calculation (min) (ACUTE ONLY): 43 min  Charges:  $Gait Training: 8-22 mins $Therapeutic Exercise: 8-22 mins $Self Care/Home Management: [place]    [name] 03/02/2018, 2:27 PM

## 2018-03-02 NOTE — Progress Notes (Signed)
Physical Therapy Treatment Patient Details Name: [name] MRN: [id number] DOB: January 30, 1941 Today's Date: 03/02/2018    History of Present Illness L tka,. H/O Afib, cardioversion 12/23/17. 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 Daughter is present and expresses concern for patient's ability to participate in virtual. Deferred to MD/casemanagement. Patient could benefit from OPPT initially to ensure steady progress with 1:1  Pt/therapist engagement. Continue PT .  Follow Up Recommendations  Follow surgeon's recommendation for DC plan and follow-up therapies     Equipment Recommendations  None recommended by PT    Recommendations for Other Services       Precautions / Restrictions Precautions Precautions: Fall Precaution Comments: impulsive, safety Required Braces or Orthoses: Knee Immobilizer - Left    Mobility  Bed Mobility Overal bed mobility: Needs Assistance Bed Mobility: Supine to Sit     Supine to sit: Min assist     General bed mobility comments: in recliner  Transfers Overall transfer level: Needs assistance Equipment used: Rolling walker (2 wheeled) Transfers: Sit to/from Stand Sit to Stand: Min assist         General transfer comment: assist to rise and steady from recliner, patient is very tall and  loww seat.  Ambulation/Gait Ambulation/Gait assistance: Min assist Gait Distance (Feet): 130 Feet Assistive device: Rolling walker (2 wheeled) Gait Pattern/deviations: Step-to pattern;Step-through pattern Gait velocity: decreased. stopp for break x 1   General Gait Details: cues for sequence and safety, increase weight on left, posture   Stairs             Wheelchair Mobility    Modified Rankin (Stroke Patients Only)       Balance                                            Cognition Arousal/Alertness: Awake/alert Behavior During Therapy:  Impulsive Overall Cognitive Status: Impaired/Different from baseline Area of Impairment: Safety/judgement;Awareness                         Safety/Judgement: Decreased awareness of safety;Decreased awareness of deficits Awareness: Emergent   General Comments: patient is much improved in MS, calm and followint directions      Exercises Total Joint Exercises Ankle Circles/Pumps: AROM;10 reps;Both;Supine Quad Sets: AROM;Left;10 reps;Supine Short Arc Quad: AAROM;Left;10 reps;Supine Heel Slides: AAROM;Left;10 reps;Supine Hip ABduction/ADduction: AAROM;Left;10 reps;Supine Straight Leg Raises: Left;10 reps;Supine;AAROM Long Arc Quad: Seated;Left;10 reps;AAROM Knee Flexion: Seated;Left;10 reps;AAROM Goniometric ROM: 20-50 left knee flexion    General Comments        Pertinent Vitals/Pain Pain Score: 2  Faces Pain Scale: Hurts little more Pain Location: left knee Pain Descriptors / Indicators: Discomfort;Sore Pain Intervention(s): Monitored during session    Home Living                      Prior Function            PT Goals (current goals can now be found in the care plan section) Progress towards PT goals: Progressing toward goals    Frequency    7X/week      PT Plan Current plan remains appropriate    Co-evaluation              AM-PAC PT "6 Clicks" Daily  Activity  Outcome Measure  Difficulty turning over in bed (including adjusting bedclothes, sheets and blankets)?: A Little Difficulty moving from lying on back to sitting on the side of the bed? : A Little Difficulty sitting down on and standing up from a chair with arms (e.g., wheelchair, bedside commode, etc,.)?: A Lot Help needed moving to and from a bed to chair (including a wheelchair)?: A Lot Help needed walking in hospital room?: A Lot Help needed climbing 3-5 steps with a railing? : Total 6 Click Score: 13    End of Session Equipment Utilized During Treatment: Gait  belt Activity Tolerance: Patient tolerated treatment well Patient left: in chair;with call bell/phone within reach;with chair alarm set;with family/visitor present Nurse Communication: Mobility status PT Visit Diagnosis: Unsteadiness on feet (R26.81);Pain Pain - Right/Left: Left Pain - part of body: Knee     Time: 1530-1555 PT Time Calculation (min) (ACUTE ONLY): 25 min  Charges:  $Gait Training: 23-37 mins $Therapeutic Exercise: 8-22 mins $Self Care/Home Management: 8-22                        [name] 03/02/2018, 5:04 PM

## 2018-03-02 NOTE — Progress Notes (Signed)
Occupational Therapy Treatment Patient Details Name: [name] MRN: [id number] DOB: June 10, 1941 Today's Date: 03/02/2018    History of present illness L tka,. H/O Afib, cardioversion 12/23/17. 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  Followed commands today  Follow Up Recommendations  Supervision/Assistance - 24 hour    Equipment Recommendations  None recommended by OT(has [facility])    Recommendations for Other Services      Precautions / Restrictions Precautions Precautions: Fall Precaution Comments: impulsive, safety Restrictions Weight Bearing Restrictions: No       Mobility Bed Mobility         Supine to sit: Min assist     General bed mobility comments: light assistance for LLE  Transfers   Equipment used: Rolling walker (2 wheeled)   Sit to Stand: Min assist         General transfer comment: steadying assistance; cues for UE/LE placement    Balance                                           ADL either performed or assessed with clinical judgement   ADL                           Toilet Transfer: Minimal assistance;Ambulation;RW Toilet Transfer Details (indicate cue type and reason): simulated chair           General ADL Comments: Pt ambulated with light min A and cues for walker distance.  Had +2 assistance available (PT) as notes indicated pt was restless and not following commands with nursing last evening.   ADL was completed this am.  Mod cues for safety     Vision       Perception     Praxis      Cognition Arousal/Alertness: Awake/alert Behavior During Therapy: Impulsive Overall Cognitive Status: Impaired/Different from baseline Area of Impairment: Safety/judgement;Awareness                               General Comments: pt was following all commands, but he tends to move quickly. When going to sit, multiple commands  needed to extend LLE        Exercises     Shoulder Instructions       General Comments      Pertinent Vitals/ Pain       Pain Assessment: Faces Faces Pain Scale: Hurts a little bit Pain Location: L knee  Pain Descriptors / Indicators: Discomfort;Sore Pain Intervention(s): Limited activity within patient's tolerance;Monitored during session;Repositioned  Home Living                                          Prior Functioning/Environment              Frequency           Progress Toward Goals  OT Goals(current goals can now be found in the care plan section)  Progress towards OT goals: Progressing toward goals     Plan      Co-evaluation  AM-PAC PT "6 Clicks" Daily Activity     Outcome Measure   Help from another person eating meals?: None Help from another person taking care of personal grooming?: A Little Help from another person toileting, which includes using toliet, bedpan, or urinal?: A Little Help from another person bathing (including washing, rinsing, drying)?: A Lot Help from another person to put on and taking off regular upper body clothing?: A Little Help from another person to put on and taking off regular lower body clothing?: A Lot 6 Click Score: 17    End of Session    OT Visit Diagnosis: Pain Pain - Right/Left: Left Pain - part of body: Knee   Activity Tolerance Patient tolerated treatment well   Patient Left in chair;with call bell/phone within reach;with chair alarm set   Nurse Communication          Time: 7903-8333 OT Time Calculation (min): 17 min  Charges: OT General Charges $OT Visit: 1 Visit OT Treatments $Therapeutic Activity: 8-22 mins  Lesle Chris, OTR/L [phone] 03/02/2018   [place] 03/02/2018, 11:21 AM

## 2018-03-02 NOTE — Progress Notes (Signed)
   Subjective: 2 Days Post-Op Procedure(s) (LRB): LEFT TOTAL KNEE ARTHROPLASTY (Left) Patient reports pain as mild.   Patient seen in rounds with Dr. Wynelle Link. Patient is well, but had some problems with confusion yesterday afternoon. Alert and oriented during rounds this AM. Voiding without difficulty and positive flatus. Denies chest pain, Sob, or calf pain.  Plan is to go Home after hospital stay.  Objective: Vital signs in last 24 hours: Temp:  [97.3 F (36.3 C)-98.3 F (36.8 C)] 98.2 F (36.8 C) (08/14 0635) Pulse Rate:  [53-69] 64 (08/14 0635) Resp:  [16-17] 16 (08/14 0635) BP: (108-125)/(55-80) 125/67 (08/14 0635) SpO2:  [92 %-100 %] 92 % (08/14 0635)  Intake/Output from previous day:  Intake/Output Summary (Last 24 hours) at 03/02/2018 0829 Last data filed at 03/02/2018 6387 Gross per 24 hour  Intake 793.28 ml  Output 450 ml  Net 343.28 ml    Labs: Recent Labs    03/01/18 0521 03/02/18 0525  HGB 9.8* 9.1*   Recent Labs    03/01/18 0521 03/02/18 0525  WBC 9.7 10.2  RBC 3.30* 3.06*  HCT 29.7* 27.5*  PLT 142* 135*   Recent Labs    03/01/18 0521 03/02/18 0525  NA 141 138  K 4.3 3.8  CL 105 104  CO2 26 27  BUN 13 16  CREATININE 0.88 0.91  GLUCOSE 163* 117*  CALCIUM 8.5* 8.3*    Exam: General - Patient is Alert and Oriented Extremity - Neurologically intact Neurovascular intact Sensation intact distally Dorsiflexion/Plantar flexion intact Dressing/Incision - clean, dry, no drainage Motor Function - intact, moving foot and toes well on exam.   Past Medical History:  Diagnosis Date  . CAD (coronary artery disease)    s/p bare metal stend to RCA  . Chronic diastolic CHF (congestive heart failure) ([place])   . Chronic venous insufficiency   . HTN (hypertension)   . PAF (paroxysmal atrial fibrillation) (HCC)    Not on long term anticoagluation despite CHADS2VASC score of 4 due to patient refusal because he likes to ski and does not want to risk a  fall with bleeding complications.  He understands his cardioembolic risk associated with PAF.   Marland Kitchen Pneumothorax   . Ventricular tachycardia (HCC)    possibly proarrhythmia from flecainide    Assessment/Plan: 2 Days Post-Op Procedure(s) (LRB): LEFT TOTAL KNEE ARTHROPLASTY (Left) Principal Problem:   OA (osteoarthritis) of knee  Estimated body mass index is 19.58 kg/m as calculated from the following:   Height as of this encounter: 6\' 2"  (1.88 m).   Weight as of this encounter: 69.2 kg. Up with therapy  DVT Prophylaxis - Eliquis Weight-bearing as tolerated  Family has expressed concern that patient may not do well with virtual PT. Dr. Wynelle Link and I both spoke with patient regarding the importance of completing his therapy. Daughter who is a nurse will be at home with patient, and Dr. Wynelle Link and I do not feel that the VERA will be a problem with his knee rehabilitation.   Possible discharge tomorrow if meeting goals with therapy and no further issues with confusion.   Theresa Duty, PA-C Orthopedic Surgery 03/02/2018, 8:29 AM

## 2018-03-02 NOTE — Progress Notes (Signed)
Physical Therapy Treatment Patient Details Name: [name] MRN: [id number] DOB: [birth year] Today's Date: 03/02/2018    History of Present Illness L tka,. H/O Afib, cardioversion 12/23/17. 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 Daughter present and expresses concern for patient having virtual therapy. Family will be available. Deferred post op PT  To be determined by MD. Continue PT.   Follow Up Recommendations  Follow surgeon's recommendation for DC plan and follow-up therapies     Equipment Recommendations  None recommended by PT    Recommendations for Other Services       Precautions / Restrictions Precautions Precautions: Fall Precaution Comments: impulsive, safety Required Braces or Orthoses: Knee Immobilizer - Left Restrictions Weight Bearing Restrictions: No    Mobility  Bed Mobility Overal bed mobility: Needs Assistance Bed Mobility: Supine to Sit     Supine to sit: Min assist     General bed mobility comments: light assistance for LLE  Transfers Overall transfer level: Needs assistance Equipment used: Rolling walker (2 wheeled) Transfers: Sit to/from Stand Sit to Stand: Min assist         General transfer comment: steadying assistance; cues for UE/LE placement  Ambulation/Gait Ambulation/Gait assistance: Min assist Gait Distance (Feet): 100 Feet Assistive device: Rolling walker (2 wheeled) Gait Pattern/deviations: Step-to pattern;Step-through pattern Gait velocity: cues for sequence and posture   General Gait Details: cues for sequence and safety   Stairs             Wheelchair Mobility    Modified Rankin (Stroke Patients Only)       Balance                                            Cognition Arousal/Alertness: Awake/alert Behavior During Therapy: Impulsive Overall Cognitive Status: Impaired/Different from baseline Area of  Impairment: Safety/judgement;Awareness                         Safety/Judgement: Decreased awareness of safety;Decreased awareness of deficits Awareness: Emergent   General Comments: pt was following all commands, but he tends to move quickly. When going to sit, multiple commands needed to extend LLE      Exercises      General Comments        Pertinent Vitals/Pain Pain Assessment: Faces Faces Pain Scale: Hurts even more Pain Location: L knee  Pain Descriptors / Indicators: Discomfort;Sore Pain Intervention(s): Monitored during session;Limited activity within patient's tolerance;Ice applied    Home Living                      Prior Function            PT Goals (current goals can now be found in the care plan section) Progress towards PT goals: Progressing toward goals    Frequency           PT Plan Current plan remains appropriate    Co-evaluation              AM-PAC PT "6 Clicks" Daily Activity  Outcome Measure  Difficulty turning over in bed (including adjusting bedclothes, sheets and blankets)?: A Little Difficulty moving from lying on back to sitting on the side of the bed? : A Little Difficulty sitting down on and standing  up from a chair with arms (e.g., wheelchair, bedside commode, etc,.)?: A Lot Help needed moving to and from a bed to chair (including a wheelchair)?: A Lot Help needed walking in hospital room?: A Lot Help needed climbing 3-5 steps with a railing? : Total 6 Click Score: 13    End of Session Equipment Utilized During Treatment: Gait belt Activity Tolerance: Patient tolerated treatment well Patient left: in chair;with call bell/phone within reach;with chair alarm set;with family/visitor present Nurse Communication: Mobility status PT Visit Diagnosis: Unsteadiness on feet (R26.81);Pain Pain - Right/Left: Left Pain - part of body: Knee     Time: 6644-0347 PT Time Calculation (min) (ACUTE ONLY): 10  min  Charges:  $Gait Training: 8-22 mins                     [place] PT [phone]    [name] 03/02/2018, 2:20 PM

## 2018-03-03 ENCOUNTER — Telehealth: Payer: Self-pay | Admitting: Cardiology

## 2018-03-03 LAB — CBC
HCT: 31.2 % — ABNORMAL LOW (ref 39.0–52.0)
Hemoglobin: 10.3 g/dL — ABNORMAL LOW (ref 13.0–17.0)
MCH: 29.6 pg (ref 26.0–34.0)
MCHC: 33 g/dL (ref 30.0–36.0)
MCV: 89.7 fL (ref 78.0–100.0)
PLATELETS: 143 10*3/uL — AB (ref 150–400)
RBC: 3.48 MIL/uL — AB (ref 4.22–5.81)
RDW: 14.2 % (ref 11.5–15.5)
WBC: 10.2 10*3/uL (ref 4.0–10.5)

## 2018-03-03 MED ORDER — SODIUM CHLORIDE 0.9 % IV BOLUS
250.0000 mL | Freq: Once | INTRAVENOUS | Status: AC
  Administered 2018-03-03: 250 mL via INTRAVENOUS

## 2018-03-03 MED ORDER — METHOCARBAMOL 500 MG PO TABS: 500.0000 mg | ORAL_TABLET | Freq: Four times a day (QID) | ORAL | 0 refills | Status: DC | PRN

## 2018-03-03 MED ORDER — OXYCODONE HCL 5 MG PO TABS: 5.0000 mg | ORAL_TABLET | Freq: Four times a day (QID) | ORAL | 0 refills | Status: DC | PRN

## 2018-03-03 MED ORDER — ACETAMINOPHEN 325 MG PO TABS
650.0000 mg | ORAL_TABLET | Freq: Four times a day (QID) | ORAL | Status: DC | PRN
  Administered 2018-03-03 – 2018-03-07 (×8): 650 mg via ORAL
  Filled 2018-03-03 (×8): qty 2

## 2018-03-03 MED ORDER — TRAMADOL HCL 50 MG PO TABS: 50.0000 mg | ORAL_TABLET | Freq: Four times a day (QID) | ORAL | 0 refills | Status: DC | PRN

## 2018-03-03 NOTE — Telephone Encounter (Signed)
New Message:       Pt's daughter is calling and states her dad just had a knee replacement. She states he has been orthostatic hypertensive. She states the pt is still in the hospital but he is very dehydrated. She would like to speak with Dr. Radford Pax regarding this issue

## 2018-03-03 NOTE — Progress Notes (Signed)
OT Note:  Touched base with daughter to review shower transfer sequence/positioning of 3:1 vs shower seat (which she may get). She is getting an aide to assist with showering at home.  [place], OTR/L [phone] 03/03/2018

## 2018-03-03 NOTE — Progress Notes (Signed)
Pt transferred to [facility].

## 2018-03-03 NOTE — Telephone Encounter (Signed)
Spoke with patient's daughter, Lovey Newcomer, who states patient was supposed to be discharged today s/p left total knee replacement. She states patient became very orthostatic when he stood and was given IV fluids and will be staying another night. She asks if Dr. Radford Pax should consult regarding patient's cardiac medications. I advised that the admitting service for patient would need to consult cardiology in order for our team to see the patient. Daughter states this was discussed with Dr. Maureen Ralphs and she will check later to see if our team has consulted on the patient. I advised that Dr. Radford Pax is out of the office until next week but that I will route to Dr. Radford Pax for her knowledge. Sandy verbalized understanding and agreement and thanked me for the call.

## 2018-03-03 NOTE — Plan of Care (Signed)
Pt alert and oriented this am.  No complaints of pain.  Dressing intact, pt doing well.  Plan to d/c home per MD order today 03/03/18.

## 2018-03-03 NOTE — Progress Notes (Signed)
Patient alert and oriented. Family requested not to give Robaxin referencing that they feel it may have contributed to the patient's confused state. Order for Tylenol PRN obtained, pain controlled. Vitals stable. Self-catheterization output 875 mL of  amber/clear urine.

## 2018-03-03 NOTE — Progress Notes (Signed)
RN made aware by OT that pt had episode of orthostatic hypotension during therapy session.  Pt c/o dizziness and was assisted to chair. RN paged Drue Dun, [place] for Dr. Wynelle Link and 283mL bolus ordered, as well as, orthostatic bp's.  Oral intake encouraged. RN will monitor.

## 2018-03-03 NOTE — Progress Notes (Signed)
Physical Therapy Treatment Patient Details Name: [name] MRN: [id number] DOB: 11/18/1940 Today's Date: 03/03/2018    History of Present Illness L tka,. H/O Afib, cardioversion 12/23/17. 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 Previously dizzy and low BP with OT. Will check back in PM for ambulation. Plans DC tomorrow with virtual PT. Daughter present for instruction.   Follow Up Recommendations  Follow surgeon's recommendation for DC plan and follow-up therapies     Equipment Recommendations  None recommended by PT    Recommendations for Other Services       Precautions / Restrictions Precautions Precautions: Fall Precaution Comments: impulsive at times; check BP Required Braces or Orthoses: Knee Immobilizer - Left    Mobility  Bed Mobility               General bed mobility comments: oob  Transfers                 General transfer comment: did not ambulate at this time. has had a  bolus of fluids, BP soft. will check back  Ambulation/Gait                 Stairs             Wheelchair Mobility    Modified Rankin (Stroke Patients Only)       Balance                                            Cognition Arousal/Alertness: Awake/alert                                     General Comments: able to participate in all exercises, gives info re: virtual.  therapy      Exercises Total Joint Exercises Ankle Circles/Pumps: AROM;10 reps;Both;Supine Quad Sets: AROM;Left;10 reps;Supine Short Arc Quad: AAROM;Left;10 reps;Supine Heel Slides: AAROM;Left;10 reps;Supine Hip ABduction/ADduction: AAROM;Left;10 reps;Supine Straight Leg Raises: Left;10 reps;Supine;AAROM Long Arc Quad: Seated;Left;10 reps;AAROM Knee Flexion: Seated;Left;10 reps;AAROM Goniometric ROM: 15-50  left knee flexion    General Comments         Pertinent Vitals/Pain Faces Pain Scale: Hurts a little bit Pain Location: left knee Pain Descriptors / Indicators: Discomfort;Sore Pain Intervention(s): Monitored during session;Premedicated before session    Home Living                      Prior Function            PT Goals (current goals can now be found in the care plan section) Progress towards PT goals: Progressing toward goals    Frequency    7X/week      PT Plan Current plan remains appropriate    Co-evaluation              AM-PAC PT "6 Clicks" Daily Activity  Outcome Measure  Difficulty turning over in bed (including adjusting bedclothes, sheets and blankets)?: A Little Difficulty moving from lying on back to sitting on the side of the bed? : A Little Difficulty sitting down on and standing up from a chair with arms (e.g., wheelchair, bedside commode, etc,.)?: A Lot Help needed moving to and from a bed to chair (  including a wheelchair)?: A Lot Help needed walking in hospital room?: A Lot Help needed climbing 3-5 steps with a railing? : Total 6 Click Score: 13    End of Session   Activity Tolerance: Patient tolerated treatment well Patient left: in chair;with chair alarm set;with family/visitor present Nurse Communication: Mobility status PT Visit Diagnosis: Unsteadiness on feet (R26.81);Pain Pain - Right/Left: Left Pain - part of body: Knee     Time: 1252-7129 PT Time Calculation (min) (ACUTE ONLY): 48 min  Charges:  $Therapeutic Exercise: 23-37 mins $Self Care/Home Management: 8-22                        [name] 03/03/2018, 4:32 PM

## 2018-03-03 NOTE — Progress Notes (Signed)
PT Cancellation Note  Patient Details Name: [name] MRN: [id number] DOB: [birth year]   Cancelled Treatment:    Reason Eval/Treat Not Completed: Fatigue/lethargy limiting ability to participate , patient has ambulated  With nursing  And just moved. Patient desires to stay in recliner. Continue tomorrow.  [name] 03/03/2018, 6:08 PM

## 2018-03-03 NOTE — Care Management Important Message (Signed)
Important Message  Patient Details  Name: [name] MRN: [id number] Date of Birth: [birth year]   Medicare Important Message Given:  Yes    Kerin Salen 03/03/2018, 10:51 AMImportant Message  Patient Details  Name: [name] MRN: [id number] Date of Birth: [birth year]   Medicare Important Message Given:  Yes    Kerin Salen 03/03/2018, 10:51 AM

## 2018-03-03 NOTE — Progress Notes (Signed)
Occupational Therapy Treatment Patient Details Name: [name] MRN: [id number] DOB: 07/10/1941 Today's Date: 03/03/2018    History of present illness L tka,. H/O Afib, cardioversion 12/23/17. 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  Helped pt take a shower, get dressed and try to use toilet. Pt dizzy in bathroom and BP low (see vitals).  Transferred to chair, reclined.  RN aware.  Follow Up Recommendations  Supervision/Assistance - 24 hour    Equipment Recommendations  None recommended by OT(has bsc)    Recommendations for Other Services      Precautions / Restrictions Precautions Precautions: Fall Precaution Comments: impulsive at times; check BP Required Braces or Orthoses: Knee Immobilizer - Left Restrictions Weight Bearing Restrictions: No       Mobility Bed Mobility               General bed mobility comments: oob  Transfers   Equipment used: Rolling walker (2 wheeled)   Sit to Stand: Min assist         General transfer comment: assist to rise and steady.  Did not use KI as pt performing leg lift    Balance                                           ADL either performed or assessed with clinical judgement   ADL               Lower Body Bathing: Minimal assistance;Sit to/from stand       Lower Body Dressing: Moderate assistance;Sit to/from stand             Tub/Shower Transfer Details (indicate cue type and reason): min guard ambulating to shower; only stepped back in with RLE, keeping L out due to limited space.   General ADL Comments: ambulated to shower and completed this activity. Returned to room and got dressed.  Pt needed to use toilet; returned to bathroom and he was dizzy.  BPs taken and low:  see vitals section of chart.     Vision       Perception     Praxis      Cognition Arousal/Alertness: Awake/alert Behavior During Therapy: WFL for tasks  assessed/performed                                   General Comments: less impulsive today.  Needed multiple cues at times for sequencing walker:  BP was low in bathroom        Exercises     Shoulder Instructions       General Comments      Pertinent Vitals/ Pain       Faces Pain Scale: Hurts a little bit Pain Location: left knee Pain Descriptors / Indicators: Discomfort;Sore Pain Intervention(s): Limited activity within patient's tolerance;Monitored during session;Premedicated before session;Repositioned  Home Living                                          Prior Functioning/Environment              Frequency  Min 2X/week        Progress Toward Goals  OT  Goals(current goals can now be found in the care plan section)  Progress towards OT goals: Progressing toward goals     Plan      Co-evaluation                 AM-PAC PT "6 Clicks" Daily Activity     Outcome Measure   Help from another person eating meals?: None Help from another person taking care of personal grooming?: A Little Help from another person toileting, which includes using toliet, bedpan, or urinal?: A Little Help from another person bathing (including washing, rinsing, drying)?: A Lot Help from another person to put on and taking off regular upper body clothing?: A Little Help from another person to put on and taking off regular lower body clothing?: A Lot 6 Click Score: 17    End of Session CPM Left Knee CPM Left Knee: Off  OT Visit Diagnosis: Pain Pain - Right/Left: Left Pain - part of body: Knee   Activity Tolerance Patient tolerated treatment well   Patient Left in chair;with call bell/phone within reach;with chair alarm set   Nurse Communication (BP)        Time: 3244-0102 OT Time Calculation (min): 57 min  Charges: OT General Charges $OT Visit: 1 Visit OT Treatments $Self Care/Home Management : 53-67 mins  Lesle Chris, OTR/L [phone] 03/03/2018   [name] 03/03/2018, 11:08 AM

## 2018-03-03 NOTE — Progress Notes (Signed)
   Subjective: 3 Days Post-Op Procedure(s) (LRB): LEFT TOTAL KNEE ARTHROPLASTY (Left) Patient reports pain as mild.   Patient seen in rounds by Dr. Wynelle Link. Patient is well, and has had no acute complaints or problems. Pt alert and oriented this AM, states he is ready to go home. Voiding without difficulty and positive flatus. Denies chest pain, SOB, or calf pain. Plan is to go Home after hospital stay.  Objective: Vital signs in last 24 hours: Temp:  [97.9 F (36.6 C)-99.4 F (37.4 C)] 97.9 F (36.6 C) (08/15 0519) Pulse Rate:  [63-122] 63 (08/15 0519) Resp:  [16-18] 16 (08/15 0519) BP: (123-145)/(66-88) 123/66 (08/15 0519) SpO2:  [97 %-98 %] 98 % (08/15 0519)  Intake/Output from previous day:  Intake/Output Summary (Last 24 hours) at 03/03/2018 0730 Last data filed at 03/03/2018 0531 Gross per 24 hour  Intake 435 ml  Output 875 ml  Net -440 ml     Labs: Recent Labs    03/01/18 0521 03/02/18 0525 03/03/18 0546  HGB 9.8* 9.1* 10.3*   Recent Labs    03/02/18 0525 03/03/18 0546  WBC 10.2 10.2  RBC 3.06* 3.48*  HCT 27.5* 31.2*  PLT 135* 143*   Recent Labs    03/01/18 0521 03/02/18 0525  NA 141 138  K 4.3 3.8  CL 105 104  CO2 26 27  BUN 13 16  CREATININE 0.88 0.91  GLUCOSE 163* 117*  CALCIUM 8.5* 8.3*   Exam: General - Patient is Alert and Oriented Extremity - Neurologically intact Neurovascular intact Sensation intact distally Dorsiflexion/Plantar flexion intact Dressing/Incision - clean, dry, no drainage Motor Function - intact, moving foot and toes well on exam.   Past Medical History:  Diagnosis Date  . CAD (coronary artery disease)    s/p bare metal stend to RCA  . Chronic diastolic CHF (congestive heart failure) ([place])   . Chronic venous insufficiency   . HTN (hypertension)   . PAF (paroxysmal atrial fibrillation) ([facility])    Not on long term anticoagluation despite CHADS2VASC score of 4 due to patient refusal because he likes to ski and does  not want to risk a fall with bleeding complications.  He understands his cardioembolic risk associated with PAF.   Marland Kitchen Pneumothorax   . Ventricular tachycardia (HCC)    possibly proarrhythmia from flecainide    Assessment/Plan: 3 Days Post-Op Procedure(s) (LRB): LEFT TOTAL KNEE ARTHROPLASTY (Left) Principal Problem:   OA (osteoarthritis) of knee  Estimated body mass index is 19.58 kg/m as calculated from the following:   Height as of this encounter: 6\' 2"  (1.88 m).   Weight as of this encounter: 69.2 kg. Up with therapy D/C IV fluids  DVT Prophylaxis - Eliquis Weight-bearing as tolerated  Plan for discharge to home today with virtual physical therapy. Follow-up in the office with Dr. Wynelle Link in 2 weeks.   Theresa Duty, PA-C Orthopedic Surgery 03/03/2018, 7:30 AM

## 2018-03-04 ENCOUNTER — Other Ambulatory Visit: Payer: Self-pay

## 2018-03-04 ENCOUNTER — Encounter (HOSPITAL_COMMUNITY): Payer: Self-pay | Admitting: Nurse Practitioner

## 2018-03-04 DIAGNOSIS — I4891 Unspecified atrial fibrillation: Secondary | ICD-10-CM

## 2018-03-04 DIAGNOSIS — M1710 Unilateral primary osteoarthritis, unspecified knee: Secondary | ICD-10-CM

## 2018-03-04 LAB — BASIC METABOLIC PANEL
Anion gap: 11 (ref 5–15)
BUN: 25 mg/dL — AB (ref 8–23)
CALCIUM: 8.6 mg/dL — AB (ref 8.9–10.3)
CO2: 28 mmol/L (ref 22–32)
Chloride: 102 mmol/L (ref 98–111)
Creatinine, Ser: 1 mg/dL (ref 0.61–1.24)
GFR calc non Af Amer: >60 mL/min (ref >60)
Glucose, Bld: 112 mg/dL — ABNORMAL HIGH (ref 70–99)
Potassium: 3.7 mmol/L (ref 3.5–5.1)
Sodium: 141 mmol/L (ref 135–145)

## 2018-03-04 LAB — CBC
HEMATOCRIT: 31.4 % — AB (ref 39.0–52.0)
Hemoglobin: 10.2 g/dL — ABNORMAL LOW (ref 13.0–17.0)
MCH: 29.1 pg (ref 26.0–34.0)
MCHC: 32.5 g/dL (ref 30.0–36.0)
MCV: 89.7 fL (ref 78.0–100.0)
Platelets: 196 10*3/uL (ref 150–400)
RBC: 3.5 MIL/uL — ABNORMAL LOW (ref 4.22–5.81)
RDW: 14.2 % (ref 11.5–15.5)
WBC: 10 10*3/uL (ref 4.0–10.5)

## 2018-03-04 MED ORDER — AMIODARONE HCL IN DEXTROSE 360-4.14 MG/200ML-% IV SOLN
30.0000 mg/h | INTRAVENOUS | Status: DC
  Administered 2018-03-04 – 2018-03-08 (×7): 30 mg/h via INTRAVENOUS
  Filled 2018-03-04 (×10): qty 200

## 2018-03-04 MED ORDER — SODIUM CHLORIDE 0.9 % IV BOLUS
500.0000 mL | Freq: Once | INTRAVENOUS | Status: AC
  Administered 2018-03-04: 500 mL via INTRAVENOUS

## 2018-03-04 MED ORDER — METOPROLOL TARTRATE 5 MG/5ML IV SOLN
5.0000 mg | Freq: Once | INTRAVENOUS | Status: AC
  Administered 2018-03-04: 5 mg via INTRAVENOUS
  Filled 2018-03-04 (×2): qty 5

## 2018-03-04 MED ORDER — APIXABAN 5 MG PO TABS
5.0000 mg | ORAL_TABLET | Freq: Two times a day (BID) | ORAL | Status: DC
  Administered 2018-03-04 – 2018-03-08 (×8): 5 mg via ORAL
  Filled 2018-03-04 (×8): qty 1

## 2018-03-04 MED ORDER — AMIODARONE LOAD VIA INFUSION
150.0000 mg | Freq: Once | INTRAVENOUS | Status: AC
  Administered 2018-03-04: 150 mg via INTRAVENOUS
  Filled 2018-03-04 (×3): qty 83.34

## 2018-03-04 MED ORDER — AMIODARONE HCL IN DEXTROSE 360-4.14 MG/200ML-% IV SOLN
60.0000 mg/h | INTRAVENOUS | Status: AC
  Administered 2018-03-04 (×2): 60 mg/h via INTRAVENOUS
  Filled 2018-03-04 (×2): qty 200

## 2018-03-04 NOTE — Progress Notes (Signed)
PT Cancellation Note  Patient Details Name: [name] MRN: [id number] DOB: [birth year]   Cancelled Treatment:    Reason Eval/Treat Not Completed: Medical issues which prohibited therapyAttemptred in AM but needed to go to [facility],  Now transferred to tele for afib/ RVR. Check back when medically stable.   Marcelino Freestone PT [phone]  03/04/2018, 4:19 PM

## 2018-03-04 NOTE — Progress Notes (Signed)
Received a call from patient's daughter, Claudie Fisherman, re: his care specifically relating to a hypotensive episode the patient had with OT.  She went on to express her concern over her Dad's dehydration, hypotension, BP meds, and his Lasix.  She asked for me to contact the on call MD and request a hospitalist and/or Cardiology consult.  I explained to her that it felt inappropriate for me to request the MD make those referrals and she became very upset. I reviewed the patient's BPs with her, explaining that yesterday's interventions by Dr. Wynelle Link and Theresa Duty, PA appeared to adequately address the patient's hypotension.  I informed her that I planned to contact the on call person for orders to hold the patient's beta blocker and that I would discuss the diuretic with Dr. Wynelle Link in the morning when he made rounds.  Mrs. Harlow Mares requested I call her back to inform her of the on call person's response to her requests. I told her I would give her a return call.

## 2018-03-04 NOTE — Progress Notes (Signed)
I called the patient's daughter to update her on her dad and what the on call person decided to do. I reaffirmed my previous statement to her that I would discuss her concerns with Dr. Wynelle Link in the morning. She thanked me for calling back with the update and I reassured her that I was glad to care for her father.

## 2018-03-04 NOTE — Plan of Care (Signed)
Patient to transfer to telemetry. Plan discussed with patient and daughter.

## 2018-03-04 NOTE — Progress Notes (Addendum)
   Subjective: 4 Days Post-Op Procedure(s) (LRB): LEFT TOTAL KNEE ARTHROPLASTY (Left) Patient reports pain as mild.   Patient seen in rounds by Dr. Wynelle Link. Patient is well, and has had no acute complaints or problems. Denies chest pain or SOB. Voiding without difficulty and positive flatus. Plan is to go Home after hospital stay.  Objective: Vital signs in last 24 hours: Temp:  [97.5 F (36.4 C)-98.6 F (37 C)] 97.6 F (36.4 C) (08/16 0542) Pulse Rate:  [55-119] 87 (08/16 0542) Resp:  [16-18] 16 (08/16 0542) BP: (77-116)/(60-87) 98/76 (08/16 0542) SpO2:  [97 %-100 %] 99 % (08/16 0542)  Intake/Output from previous day:  Intake/Output Summary (Last 24 hours) at 03/04/2018 0802 Last data filed at 03/04/2018 0545 Gross per 24 hour  Intake 661.6 ml  Output 901 ml  Net -239.4 ml    Intake/Output this shift: No intake/output data recorded.  Labs: Recent Labs    03/02/18 0525 03/03/18 0546  HGB 9.1* 10.3*   Recent Labs    03/02/18 0525 03/03/18 0546  WBC 10.2 10.2  RBC 3.06* 3.48*  HCT 27.5* 31.2*  PLT 135* 143*   Recent Labs    03/02/18 0525  NA 138  K 3.8  CL 104  CO2 27  BUN 16  CREATININE 0.91  GLUCOSE 117*  CALCIUM 8.3*   No results for input(s): LABPT, INR in the last 72 hours.  Exam: General - Patient is Alert and Oriented Extremity - Neurologically intact Neurovascular intact Sensation intact distally Dorsiflexion/Plantar flexion intact Dressing/Incision - clean, dry, no drainage Motor Function - intact, moving foot and toes well on exam.   Past Medical History:  Diagnosis Date  . CAD (coronary artery disease)    s/p bare metal stend to RCA  . Chronic diastolic CHF (congestive heart failure) ([place])   . Chronic venous insufficiency   . HTN (hypertension)   . PAF (paroxysmal atrial fibrillation) (HCC)    Not on long term anticoagluation despite CHADS2VASC score of 4 due to patient refusal because he likes to ski and does not want to risk a  fall with bleeding complications.  He understands his cardioembolic risk associated with PAF.   Marland Kitchen Pneumothorax   . Ventricular tachycardia (HCC)    possibly proarrhythmia from flecainide    Assessment/Plan: 4 Days Post-Op Procedure(s) (LRB): LEFT TOTAL KNEE ARTHROPLASTY (Left) Principal Problem:   OA (osteoarthritis) of knee  Estimated body mass index is 19.58 kg/m as calculated from the following:   Height as of this encounter: 6\' 2"  (1.88 m).   Weight as of this encounter: 69.2 kg. Up with therapy  DVT Prophylaxis - Eliquis Weight-bearing as tolerated  Spoke with Justice Deeds, RN this AM. Will call cardiology consult for patient.  Theresa Duty, PA-C Orthopedic Surgery 03/04/2018, 8:02 AM

## 2018-03-04 NOTE — Plan of Care (Signed)
Plan of care discussed/reviewed.

## 2018-03-04 NOTE — Progress Notes (Signed)
Merla Riches, PA-C was notified regarding the patient's BP (99/73) and the requests made by the patient's daughter. Order received to hold the beta blocker.  Leroy Sea stated that we would hold off on calling any consults unless the patient began to exhibit symptomatic hypotension. I explained to the patient that he would not be getting his Pindolol tonight due to his current BP.  Patient verbalized u/o holding this med.

## 2018-03-04 NOTE — Consult Note (Addendum)
Cardiology Consult    Patient ID: [name] MRN: [id number], DOB/AGE: 02/04/1941   Admit date: 02/28/2018 Date of Consult: 03/04/2018  Primary Physician: Patient, No Pcp Per Primary Cardiologist: Fransico Him, MD Requesting Provider: F. Aluisio, MD  Patient Profile    [name] is a 77 y.o. male with a history of CAD s/p RCA stenting in 2007, HFpEF, PAF s/p recent DCCV, HTN, HL, and OA s/p L TKA, who is being seen today for the evaluation of low blood pressures at the request of Dr. Wynelle Link.  Past Medical History   Past Medical History:  Diagnosis Date  . CAD (coronary artery disease)    a. 05/2006 Abnl stress test w/ 2-59mm ST dep; b. 05/2006 Cath/PCI: LM nl, LAD 30-40ost, 20-30p, 90d (small), D1 nl, D2 nl, LCX nl, OM1/2 nl, RCA Ca2+, 67m/d (3.5x16 Liberty BMS & 3.5x12 Liberty BMS), EF 50%.  . Chronic diastolic CHF (congestive heart failure) ([place])    a. 10/2017 Echo: EF 50-55%, mild AI, sev dil LA, mod dil RA.  Marland Kitchen Chronic venous insufficiency   . HTN (hypertension)   . PAF (paroxysmal atrial fibrillation) ([place])    a. CHA2DS2VASc 5-->Eliquis; b. Prev WCT on flecainide;  c. 12/2017 s/p DCCV.  Marland Kitchen Pneumothorax   . Ventricular tachycardia (HCC)    a. possibly proarrhythmia from flecainide.    Past Surgical History:  Procedure Laterality Date  . CARDIOVERSION N/A 12/23/2017   Procedure: CARDIOVERSION;  Surgeon: Sueanne Margarita, MD;  Location: [facility] ENDOSCOPY;  Service: Cardiovascular;  Laterality: N/A;  . INGUINAL HERNIA REPAIR     x2  . TOTAL KNEE ARTHROPLASTY Left 02/28/2018   Procedure: LEFT TOTAL KNEE ARTHROPLASTY;  Surgeon: Gaynelle Arabian, MD;  Location: [facility] ORS;  Service: Orthopedics;  Laterality: Left;     Allergies  No Known Allergies  History of Present Illness    77 year old male with the above complex past medical history including coronary artery disease, HFpEF, paroxysmal atrial fibrillation, hypertension, hyperlipidemia, and arthritis.  His cardiac history  dates back to the early 2000, when he was diagnosed with paroxysmal atrial fibrillation.  He was initially placed on flecainide but Holter monitoring subsequently showed wide-complex tachycardia concerning for ventricular tachycardia.  Flecainide was discontinued.  He subsequently underwent exercise treadmill testing and developed 2 to 3 mm downsloping ST segment depression, which led to diagnostic catheterization revealing severe RCA disease which was treated with 2 bare-metal stents.  For a long time, he was not on any oral anticoagulation however in April of this year, he developed acute diastolic congestive heart failure with an EF of 50 to 55%.  He was noted to be back in atrial fibrillation.  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  Lower extremity duplex showed venous insufficiency on the right with a complex cystic structure in the left medial knee.  He was placed on Eliquis and after 4 weeks of compliance, he subsequently underwent successful cardioversion in June.  Given cystic structure in the left knee, he was referred to orthopedics and in the setting of worsening pain and arthritis, decision was made to pursue left total knee arthroplasty.  Patient presented August 12 for left total knee arthroplasty.  He has been receiving physical therapy since and on August 15, he was noted to have symptomatic orthostatic hypotension while working with occupational therapy.  Blood pressure dropped to 77/60 and he felt lightheaded.  He was given IV fluids and his beta-blocker dose was held last night and this morning. He has continued to receive lasix 20mg  daily (last dose this AM). Pressures have been in the high 90s to low 100s since.  His last dose of oxycodone was 2 days ago.  We have been asked to evaluate.  He denies chest pain, palpitations, dyspnea, pnd, orthopnea, n, v, syncope, edema, weight gain,  or early satiety. He left leg feels ok - not too painful and w/o swelling - now only req acetaminophen prn.  Inpatient Medications    . apixaban  2.5 mg Oral Q12H  . atorvastatin  20 mg Oral q1800  . docusate sodium  100 mg Oral BID  . furosemide  20 mg Oral Daily  . pindolol  10 mg Oral BID  . tamsulosin  0.4 mg Oral BID    Family History    Family History  Problem Relation Age of Onset  . Coronary artery disease Unknown    He indicated that his mother is deceased. He indicated that his father is deceased. He indicated that his maternal grandmother is deceased. He indicated that his maternal grandfather is deceased. He indicated that his paternal grandmother is deceased. He indicated that his paternal grandfather is deceased. He indicated that the status of his unknown relative is unknown.   Social History    Social History   Socioeconomic History  . Marital status: Widowed    Spouse name: Not on file  . Number of children: Not on file  . Years of education: Not on file  . Highest education level: Not on file  Occupational History  . Occupation: retired  Scientific laboratory technician  . Financial resource strain: Not on file  . Food insecurity:    Worry: Not on file    Inability: Not on file  . Transportation needs:    Medical: Not on file    Non-medical: Not on file  Tobacco Use  . Smoking status: Never Smoker  . Smokeless tobacco: Never Used  Substance and Sexual Activity  . Alcohol use: No    Comment: occsaoinal  . Drug use: Yes    Types: Other-see comments  . Sexual activity: Not on file  Lifestyle  . Physical activity:    Days per week: Not on file    Minutes per session: Not on file  . Stress: Not on file  Relationships  . Social connections:    Talks on phone: Not on file    Gets together: Not on file    Attends religious service: Not on file    Active member of club or organization: Not on file    Attends meetings of clubs or organizations: Not on file     Relationship status: Not on file  . Intimate partner violence:    Fear of current or ex partner: Not on file    Emotionally abused: Not on file    Physically abused: Not on file    Forced sexual activity: Not on file  Other Topics Concern  . Not on file  Social History Narrative  . Not on file     Review of Systems    General:  No chills, fever, night sweats or weight changes.  Cardiovascular:  No chest pain, dyspnea on exertion, edema, orthopnea, palpitations, paroxysmal nocturnal dyspnea. +++LH while working w/ OT yesterday. Dermatological: No rash, lesions/masses Respiratory: No cough, dyspnea Urologic: No hematuria, dysuria Abdominal:   No nausea, vomiting, diarrhea, bright red blood per  rectum, melena, or hematemesis Neurologic:  No visual changes, wkns, changes in mental status. MSK: minimal left knee pain. All other systems reviewed and are otherwise negative except as noted above.  Physical Exam    Blood pressure 98/76, pulse 87, temperature 97.6 F (36.4 C), temperature source Oral, resp. rate 16, height 6\' 2"  (1.88 m), weight 69.2 kg, SpO2 99 %.  General: Pleasant, NAD Psych: Normal affect. Neuro: Alert and oriented X 3. Moves all extremities spontaneously. HEENT: Normal  Neck: Supple without bruits or JVD. Lungs:  Resp regular and unlabored, CTA. Heart: IR, IR no s3, s4, or murmurs. Abdomen: Soft, non-tender, non-distended, BS + x 4.  Extremities: No clubbing, cyanosis or edema. DP/PT/Radials 2+ and equal bilaterally.  Labs     Lab Results  Component Value Date   WBC 10.2 03/03/2018   HGB 10.3 (L) 03/03/2018   HCT 31.2 (L) 03/03/2018   MCV 89.7 03/03/2018   PLT 143 (L) 03/03/2018    Recent Labs  Lab 03/02/18 0525  NA 138  K 3.8  CL 104  CO2 27  BUN 16  CREATININE 0.91  CALCIUM 8.3*  GLUCOSE 117*   Lab Results  Component Value Date   CHOL 117 07/02/2017   HDL 58 07/02/2017   LDLCALC 51 07/02/2017   TRIG 39 07/02/2017     Radiology Studies     No results found.  ECG & Cardiac Imaging    Afib, 165, inflat st dep - new  Assessment & Plan    1.  Orthostatic hypotension: S/p L TKA.  BP's initially stable post-op but softened on 8/15 and he developed LH while walking with OT on 8/15, with a bp of 77/60.  He received 27ml bolus yesterday and pindolol held last night and this AM.  He did receive lasix this AM.  No narcotics in 48h.  BP's have since been in the mid 90's to low 100's.  He has had no recurrent presyncope/orthostasis.  On exam, rhythm was irregular and so I obtained ECG showing rapid afib.  F/u ecg shows rapid afib.  He is asymptomatic.  HR's in the 90's yesterday, 119 last night, and 87 this AM.  Currently 165.  It's possible that he was in afib yesterday and it may be contributing to relative hypotension.  I will rebolus 500 ml over 2 hrs and tx to tele  lopressor 5mg  IV.  Resume pindolol.   2.  Paroxysmal atrial fibrillation: s/p recent DCCV in June.  At last office visit, he was in sinus with freq PAC's and runs of Atach and pindolol was increased to 10mg  BID.  As above, irregular on exam today and currently in rapid Afib.  Bolus and  blocker for now.  His LA was sev dil on echo in April and I suspect he will require antiarrhythmic rx  amio.  Will discuss w/ MD.  He is currently on eliquis 2.5bid in the post-op period but will resume 5mg  bid @ discharge - I will do this now.  F/u bmet/Mg. If he does not convert on amio, we would consider repeat DCCV.  3.  Coronary artery disease: s/p RCA BMS x 2 in 2007.  No chest pain or dyspnea.  ECG w/ inflat ST dep in setting of rapid afib.  Will check trop.  Cont  blocker and statin. No ASA in setting of eliquis.  4.  HFpEF: EF 50-55% in setting of Afib earlier this year.  Euvolemic on exam.  Will have to monitor  volume closely in setting of recurrent afib.  Resume  blocker.  6.  Osteoarthritis status post left total knee arthroplasty: Per Ortho. In setting of above, would hold off  on d/c.  Signed, Murray Hodgkins, NP 03/04/2018, 11:39 AM  For questions or updates, please contact   Please consult [email or web address] for contact info under Cardiology/STEMI.

## 2018-03-04 NOTE — Progress Notes (Signed)
When making rounds this morning, I spoke with Dr. Wynelle Link re: the patient's daughter's concerns and the conversations I had had with her last night.

## 2018-03-05 DIAGNOSIS — Z7901 Long term (current) use of anticoagulants: Secondary | ICD-10-CM

## 2018-03-05 DIAGNOSIS — I480 Paroxysmal atrial fibrillation: Secondary | ICD-10-CM

## 2018-03-05 DIAGNOSIS — I25118 Atherosclerotic heart disease of native coronary artery with other forms of angina pectoris: Secondary | ICD-10-CM

## 2018-03-05 LAB — MAGNESIUM: Magnesium: 1.9 mg/dL (ref 1.7–2.4)

## 2018-03-05 NOTE — Progress Notes (Signed)
PT Cancellation Note  Patient Details Name: DERMOT GREMILLION MRN: [id number] DOB: 17-Dec-1940   Cancelled Treatment:    Reason Eval/Treat Not Completed: Other (comment) attempted to work with patient, however he was occupied with MD at this time. Will plan to attempt back later as schedule allows.    Deniece Ree PT, DPT, CBIS  Supplemental Physical Therapist [facility]   Pager [phone]

## 2018-03-05 NOTE — Progress Notes (Signed)
   Subjective: 5 Days Post-Op Procedure(s) (LRB): LEFT TOTAL KNEE ARTHROPLASTY (Left) Patient reports pain as mild.   Patient seen in rounds for Dr. Wynelle Link. Patient is well, and has had no acute complaints or problems. Voiding without difficulty and positive flatus. Denies chest pain, SOB, or calf pain. Plan is to go Home after hospital stay.  Objective: Vital signs in last 24 hours: Temp:  [97.6 F (36.4 C)-98.2 F (36.8 C)] 98.2 F (36.8 C) (08/17 0634) Pulse Rate:  [79-120] 95 (08/17 0634) Resp:  [16-18] 16 (08/17 0634) BP: (111-126)/(67-92) 121/83 (08/17 0634) SpO2:  [95 %-100 %] 100 % (08/17 0634)  Intake/Output from previous day:  Intake/Output Summary (Last 24 hours) at 03/05/2018 0855 Last data filed at 03/04/2018 1539 Gross per 24 hour  Intake 710.89 ml  Output 800 ml  Net -89.11 ml    Labs: Recent Labs    03/03/18 0546 03/04/18 1305  HGB 10.3* 10.2*   Recent Labs    03/03/18 0546 03/04/18 1305  WBC 10.2 10.0  RBC 3.48* 3.50*  HCT 31.2* 31.4*  PLT 143* 196   Recent Labs    03/04/18 1305  NA 141  K 3.7  CL 102  CO2 28  BUN 25*  CREATININE 1.00  GLUCOSE 112*  CALCIUM 8.6*   No results for input(s): LABPT, INR in the last 72 hours.  Exam: General - Patient is Alert and Oriented Extremity - Neurologically intact Neurovascular intact Sensation intact distally Dorsiflexion/Plantar flexion intact Dressing/Incision - clean, dry, no drainage Motor Function - intact, moving foot and toes well on exam.   Past Medical History:  Diagnosis Date  . CAD (coronary artery disease)    a. 05/2006 Abnl stress test w/ 2-62mm ST dep; b. 05/2006 Cath/PCI: LM nl, LAD 30-40ost, 20-30p, 90d (small), D1 nl, D2 nl, LCX nl, OM1/2 nl, RCA Ca2+, 1m/d (3.5x16 Liberty BMS & 3.5x12 Liberty BMS), EF 50%.  . Chronic diastolic CHF (congestive heart failure) ([place])    a. 10/2017 Echo: EF 50-55%, mild AI, sev dil LA, mod dil RA.  Marland Kitchen Chronic venous insufficiency   . HTN  (hypertension)   . PAF (paroxysmal atrial fibrillation) ([place])    a. CHA2DS2VASc 5-->Eliquis; b. Prev WCT on flecainide;  c. 12/2017 s/p DCCV.  Marland Kitchen Pneumothorax   . Ventricular tachycardia (HCC)    a. possibly proarrhythmia from flecainide.    Assessment/Plan: 5 Days Post-Op Procedure(s) (LRB): LEFT TOTAL KNEE ARTHROPLASTY (Left) Principal Problem:   OA (osteoarthritis) of knee  Estimated body mass index is 19.58 kg/m as calculated from the following:   Height as of this encounter: 6\' 2"  (1.88 m).   Weight as of this encounter: 69.2 kg. Up with therapy  DVT Prophylaxis - Eliquis Weight-bearing as tolerated  For orthopedic standpoint, pt is ready to go home with virtual physical therapy. Pending cardiology clearance for discharge. Follow-up in the office in 2 weeks with Dr. Wynelle Link.  Theresa Duty, PA-C Orthopedic Surgery 03/05/2018, 8:55 AM

## 2018-03-05 NOTE — Progress Notes (Signed)
Progress Note  Patient Name: [name] Date of Encounter: 03/05/2018  Primary Cardiologist: Fransico Him, MD   Subjective   Denies any palpitations, SOB, CP.  Inpatient Medications    Scheduled Meds: . apixaban  5 mg Oral Q12H  . atorvastatin  20 mg Oral q1800  . docusate sodium  100 mg Oral BID  . pindolol  10 mg Oral BID  . tamsulosin  0.4 mg Oral BID   Continuous Infusions: . sodium chloride 10 mL/hr at 03/04/18 1722  . amiodarone 30 mg/hr (03/04/18 2042)   PRN Meds: acetaminophen, bisacodyl, diphenhydrAMINE, HYDROmorphone (DILAUDID) injection, menthol-cetylpyridinium **OR** phenol, methocarbamol **OR** methocarbamol (ROBAXIN) IV, metoCLOPramide **OR** metoCLOPramide (REGLAN) injection, nitroGLYCERIN, ondansetron **OR** ondansetron (ZOFRAN) IV, oxyCODONE, oxyCODONE, polyethylene glycol, sodium phosphate, traMADol   Vital Signs    Vitals:   03/04/18 1428 03/04/18 1523 03/04/18 2311 03/05/18 0634  BP: (!) 126/92 111/80 113/67 121/83  Pulse: (!) 109 (!) 120 79 95  Resp: 17 18 16 16   Temp: 97.6 F (36.4 C) 97.9 F (36.6 C) 98 F (36.7 C) 98.2 F (36.8 C)  TempSrc: Oral Oral Oral Oral  SpO2: 98% 95% 99% 100%  Weight:      Height:        Intake/Output Summary (Last 24 hours) at 03/05/2018 0928 Last data filed at 03/04/2018 1539 Gross per 24 hour  Intake 710.89 ml  Output 800 ml  Net -89.11 ml   Filed Weights   02/28/18 1121  Weight: 69.2 kg   Telemetry    A-fib with RVR ventricular rates 95-115 - Personally Reviewed  Physical Exam   GEN: No acute distress.   Neck: No JVD Cardiac: iRRR, no murmurs, rubs, or gallops.  Respiratory: Clear to auscultation bilaterally. GI: Soft, nontender, non-distended  MS: mild B/L edema; No deformity. Neuro:  Nonfocal  Psych: Normal affect   Labs    Chemistry Recent Labs  Lab 03/01/18 0521 03/02/18 0525 03/04/18 1305  NA 141 138 141  K 4.3 3.8 3.7  CL 105 104 102  CO2 26 27 28   GLUCOSE 163* 117*  112*  BUN 13 16 25*  CREATININE 0.88 0.91 1.00  CALCIUM 8.5* 8.3* 8.6*  GFRNONAA >60 >60 >60  GFRAA >60 >60 >60  ANIONGAP 10 7 11     Hematology Recent Labs  Lab 03/02/18 0525 03/03/18 0546 03/04/18 1305  WBC 10.2 10.2 10.0  RBC 3.06* 3.48* 3.50*  HGB 9.1* 10.3* 10.2*  HCT 27.5* 31.2* 31.4*  MCV 89.9 89.7 89.7  MCH 29.7 29.6 29.1  MCHC 33.1 33.0 32.5  RDW 14.1 14.2 14.2  PLT 135* 143* 196   Cardiac EnzymesNo results for input(s): TROPONINI in the last 168 hours. No results for input(s): TROPIPOC in the last 168 hours.   BNPNo results for input(s): BNP, PROBNP in the last 168 hours.   DDimer No results for input(s): DDIMER in the last 168 hours.   Radiology    No results found.  Cardiac Studies   Patient Profile     77 y.o. male   Assessment & Plan    1. PAF  - continue amio drip - ventricular rates are better controlled, continue amio drip, if no spontaneous cardioversion, plan for a DCCV on Monday   2. Orthostatic hypotension - improved   3. CAD, s/p RCA BMS x 2 in 2007 - no chest pain  4. HFpEF: EF 50-55%  5. Osteoarthritis status post left total knee arthroplasty: Per Ortho. In setting of above, would hold  off on d/c.  For questions or updates, please contact [place] Please consult [email or web address] for contact info under Cardiology/STEMI.      Signed, Ena Dawley, MD  03/05/2018, 9:28 AM

## 2018-03-05 NOTE — Progress Notes (Signed)
Physical Therapy Treatment Patient Details Name: [name] MRN: [id number] DOB: 1941/06/06 Today's Date: 03/05/2018    History of Present Illness L tka,. H/O Afib, cardioversion 12/23/17. 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 HR at rest fluctuating between 57-70s, he was able to complete all functional mobility with S and RW today, verbal cues for safety but no physical assistance provided. Ambulated approximately 156f with RW and cues from PT for gait pattern, HR post activity found to be 109-125, BP 108/83, RN aware of vitals and patient status/response to activity. He was left up in the chair with all needs met, chair alarm active, RN present and attending.     Follow Up Recommendations  Follow surgeon's recommendation for DC plan and follow-up therapies     Equipment Recommendations  None recommended by PT    Recommendations for Other Services       Precautions / Restrictions Precautions Precautions: Fall Precaution Comments: impulsive at times; check BP Required Braces or Orthoses: Knee Immobilizer - Left Restrictions Weight Bearing Restrictions: No    Mobility  Bed Mobility               General bed mobility comments: DNT, received up in chair   Transfers Overall transfer level: Needs assistance Equipment used: Rolling walker (2 wheeled) Transfers: Sit to/from Stand Sit to Stand: Supervision         General transfer comment: S, verbal cues for safety, no physical assistance provided   Ambulation/Gait Ambulation/Gait assistance: Supervision Gait Distance (Feet): 160 Feet Assistive device: Rolling walker (2 wheeled) Gait Pattern/deviations: Step-through pattern;Decreased step length - right;Decreased stance time - left;Decreased dorsiflexion - left;Decreased weight shift to left;Trunk flexed     General Gait Details: cues for technique with gait, noted  difficulty placing L LE fully on floor, patient reports he has ongoing leg length difference contributing    Stairs             Wheelchair Mobility    Modified Rankin (Stroke Patients Only)       Balance Overall balance assessment: Needs assistance Sitting-balance support: No upper extremity supported;Feet supported Sitting balance-Leahy Scale: Good     Standing balance support: During functional activity;Bilateral upper extremity supported Standing balance-Leahy Scale: Good                              Cognition Arousal/Alertness: Awake/alert Behavior During Therapy: WFL for tasks assessed/performed Overall Cognitive Status: Impaired/Different from baseline Area of Impairment: Safety/judgement;Awareness                         Safety/Judgement: Decreased awareness of safety;Decreased awareness of deficits Awareness: Emergent   General Comments: able to participate in all exercises, gives info re: virtual.  therapy      Exercises Total Joint Exercises Ankle Circles/Pumps: Both;20 reps;Seated Hip ABduction/ADduction: Both;10 reps;Seated Long Arc Quad: Left;10 reps;Seated Knee Flexion: Left;10 reps;Seated    General Comments General comments (skin integrity, edema, etc.): HR 56-70s at rest, elevated to 109-125 post-gait at rest in chair; BP 108/83 following activity and per nursing measures had been WNL throughout day       Pertinent Vitals/Pain Pain Assessment: No/denies pain Pain Score: 0-No pain Faces Pain Scale: No hurt Pain Intervention(s): Monitored during session    Home Living  Prior Function            PT Goals (current goals can now be found in the care plan section) Acute Rehab PT Goals Patient Stated Goal: to walk, go home PT Goal Formulation: With patient Time For Goal Achievement: 03/04/18 Potential to Achieve Goals: Good Progress towards PT goals: Progressing toward goals     Frequency    7X/week      PT Plan Current plan remains appropriate    Co-evaluation              AM-PAC PT "6 Clicks" Daily Activity  Outcome Measure  Difficulty turning over in bed (including adjusting bedclothes, sheets and blankets)?: A Little Difficulty moving from lying on back to sitting on the side of the bed? : A Little Difficulty sitting down on and standing up from a chair with arms (e.g., wheelchair, bedside commode, etc,.)?: A Little Help needed moving to and from a bed to chair (including a wheelchair)?: A Little Help needed walking in hospital room?: A Little Help needed climbing 3-5 steps with a railing? : A Little 6 Click Score: 18    End of Session   Activity Tolerance: Patient tolerated treatment well Patient left: in chair;with call bell/phone within reach;with nursing/sitter in room;with chair alarm set Nurse Communication: Other (comment)(vitals during session, response to activity ) PT Visit Diagnosis: Unsteadiness on feet (R26.81);Pain Pain - Right/Left: Left Pain - part of body: Knee     Time: 6950-7225 PT Time Calculation (min) (ACUTE ONLY): 28 min  Charges:  $Gait Training: 8-22 mins $Self Care/Home Management: 8-22                     Deniece Ree PT, DPT, CBIS  Supplemental Physical Therapist [place]   Pager [phone]

## 2018-03-06 LAB — BASIC METABOLIC PANEL
Anion gap: 9 (ref 5–15)
BUN: 14 mg/dL (ref 8–23)
CO2: 27 mmol/L (ref 22–32)
Calcium: 8.4 mg/dL — ABNORMAL LOW (ref 8.9–10.3)
Chloride: 107 mmol/L (ref 98–111)
Creatinine, Ser: 1 mg/dL (ref 0.61–1.24)
GFR calc Af Amer: >60 mL/min (ref >60)
GFR calc non Af Amer: >60 mL/min (ref >60)
Glucose, Bld: 118 mg/dL — ABNORMAL HIGH (ref 70–99)
Potassium: 4 mmol/L (ref 3.5–5.1)
Sodium: 143 mmol/L (ref 135–145)

## 2018-03-06 MED ORDER — SODIUM CHLORIDE 0.9% FLUSH: 3.0000 mL | INTRAVENOUS | Status: DC | PRN

## 2018-03-06 MED ORDER — SODIUM CHLORIDE 0.9% FLUSH: 3.0000 mL | Freq: Two times a day (BID) | INTRAVENOUS | Status: DC

## 2018-03-06 MED ORDER — SODIUM CHLORIDE 0.9 % IV SOLN
250.0000 mL | INTRAVENOUS | Status: DC
  Administered 2018-03-06: 250 mL via INTRAVENOUS

## 2018-03-06 NOTE — Progress Notes (Signed)
   Subjective: 6 Days Post-Op Procedure(s) (LRB): LEFT TOTAL KNEE ARTHROPLASTY (Left) Patient reports pain as mild.  No complaints for theknee at this time. Patient is well, and has had no acute complaints or problems. Voiding without difficulty and positive flatus. Denies chest pain, SOB, or calf pain. Plan is to go Home after hospital stay.  Objective: Vital signs in last 24 hours: Temp:  [98.7 F (37.1 C)-98.8 F (37.1 C)] 98.7 F (37.1 C) (08/18 0504) Pulse Rate:  [81-94] 83 (08/18 0504) Resp:  [12-18] 14 (08/18 0504) BP: (107-119)/(71-85) 119/75 (08/18 0504) SpO2:  [87 %-98 %] 97 % (08/18 0504)  Intake/Output from previous day:  Intake/Output Summary (Last 24 hours) at 03/06/2018 0949 Last data filed at 03/06/2018 0600 Gross per 24 hour  Intake 1190.22 ml  Output 500 ml  Net 690.22 ml    Labs: Recent Labs    03/04/18 1305  HGB 10.2*   Recent Labs    03/04/18 1305  WBC 10.0  RBC 3.50*  HCT 31.4*  PLT 196   Recent Labs    03/04/18 1305  NA 141  K 3.7  CL 102  CO2 28  BUN 25*  CREATININE 1.00  GLUCOSE 112*  CALCIUM 8.6*   No results for input(s): LABPT, INR in the last 72 hours.  Exam: General - Patient is Alert and Oriented Extremity - Neurologically intact Neurovascular intact Sensation intact distally Dorsiflexion/Plantar flexion intact Dressing/Incision - clean, dry, no drainage Motor Function - intact, moving foot and toes well on exam.   Past Medical History:  Diagnosis Date  . CAD (coronary artery disease)    a. 05/2006 Abnl stress test w/ 2-62mm ST dep; b. 05/2006 Cath/PCI: LM nl, LAD 30-40ost, 20-30p, 90d (small), D1 nl, D2 nl, LCX nl, OM1/2 nl, RCA Ca2+, 54m/d (3.5x16 Liberty BMS & 3.5x12 Liberty BMS), EF 50%.  . Chronic diastolic CHF (congestive heart failure) ([place])    a. 10/2017 Echo: EF 50-55%, mild AI, sev dil LA, mod dil RA.  Marland Kitchen Chronic venous insufficiency   . HTN (hypertension)   . PAF (paroxysmal atrial fibrillation) ([place])    a.  CHA2DS2VASc 5-->Eliquis; b. Prev WCT on flecainide;  c. 12/2017 s/p DCCV.  Marland Kitchen Pneumothorax   . Ventricular tachycardia (HCC)    a. possibly proarrhythmia from flecainide.    Assessment/Plan: 6 Days Post-Op Procedure(s) (LRB): LEFT TOTAL KNEE ARTHROPLASTY (Left) Principal Problem:   OA (osteoarthritis) of knee Active Problems:   Atrial fibrillation with RVR (HCC)  Estimated body mass index is 19.58 kg/m as calculated from the following:   Height as of this encounter: 6\' 2"  (1.88 m).   Weight as of this encounter: 69.2 kg. Up with therapy  DVT Prophylaxis - Eliquis Weight-bearing as tolerated  For orthopedic standpoint, pt is ready to go home with virtual physical therapy.  Will keep NPO tonight at [facility] in case they plan for cardioversion tomrrow.  Pt is going to consider his options.  Likely dc tomorrow afternoon.  Nicholes Stairs  Orthopedic Surgery 03/06/2018, 9:49 AM

## 2018-03-06 NOTE — Progress Notes (Signed)
12 Lead EKG obtained to confirm conversion to ST. Placed on chart. Eulas Post, RN

## 2018-03-06 NOTE — Progress Notes (Signed)
Physical Therapy Treatment Patient Details Name: [name] MRN: [id number] DOB: October 27, 1940 Today's Date: 03/06/2018    History of Present Illness Pt is a 77 year old male s/p L TKA with post op afib,. H/O Afib, cardioversion 12/23/17. 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  Pt with HR 109-126 bpm during session.  Possible DCCV tomorrow prior to d/c home.  Follow Up Recommendations  Follow surgeon's recommendation for DC plan and follow-up therapies(plans for virtual therapy)     Equipment Recommendations  None recommended by PT    Recommendations for Other Services       Precautions / Restrictions Precautions Precautions: Fall Precaution Comments: impulsive at times; monitor HR Restrictions Weight Bearing Restrictions: No    Mobility  Bed Mobility Overal bed mobility: Needs Assistance Bed Mobility: Supine to Sit     Supine to sit: Supervision;HOB elevated        Transfers Overall transfer level: Needs assistance Equipment used: Rolling walker (2 wheeled) Transfers: Sit to/from Stand Sit to Stand: Min guard;From elevated surface         General transfer comment: min/guard for safety  Ambulation/Gait Ambulation/Gait assistance: Min guard;Supervision Gait Distance (Feet): 180 Feet Assistive device: Rolling walker (2 wheeled) Gait Pattern/deviations: Step-through pattern;Decreased stride length;Trunk flexed     General Gait Details: verbal cues for posture, heel strike, L knee flexion with swing; HR 109-126 bpm during gait   Stairs             Wheelchair Mobility    Modified Rankin (Stroke Patients Only)       Balance                                            Cognition Arousal/Alertness: Awake/alert Behavior During Therapy: WFL for tasks assessed/performed Overall Cognitive Status: Within Functional Limits for tasks assessed                                         Exercises Total Joint Exercises Ankle Circles/Pumps: Both;20 reps;Seated Quad Sets: AROM;Left;10 reps;Supine Short Arc Quad: AAROM;Left;10 reps;Supine Heel Slides: AAROM;Left;10 reps;Seated Hip ABduction/ADduction: Both;10 reps;AROM Straight Leg Raises: Left;10 reps;Supine;AAROM    General Comments        Pertinent Vitals/Pain Pain Assessment: 0-10 Pain Score: 2  Pain Location: left knee Pain Descriptors / Indicators: Discomfort;Sore Pain Intervention(s): Limited activity within patient's tolerance;Repositioned;Monitored during session;Ice applied;Premedicated before session    Home Living                      Prior Function            PT Goals (current goals can now be found in the care plan section) Progress towards PT goals: Progressing toward goals    Frequency    7X/week      PT Plan Current plan remains appropriate    Co-evaluation              AM-PAC PT "6 Clicks" Daily Activity  Outcome Measure  Difficulty turning over in bed (including adjusting bedclothes, sheets and blankets)?: A Little Difficulty moving from lying on back to sitting on the side of the bed? : A Little Difficulty sitting down on  and standing up from a chair with arms (e.g., wheelchair, bedside commode, etc,.)?: A Little Help needed moving to and from a bed to chair (including a wheelchair)?: A Little Help needed walking in hospital room?: A Little Help needed climbing 3-5 steps with a railing? : A Little 6 Click Score: 18    End of Session Equipment Utilized During Treatment: Gait belt Activity Tolerance: Patient tolerated treatment well Patient left: in chair;with call bell/phone within reach;with nursing/sitter in room;with chair alarm set Nurse Communication: Mobility status PT Visit Diagnosis: Difficulty in walking, not elsewhere classified (R26.2)     Time: 0981-1914 PT Time Calculation (min) (ACUTE ONLY): 22  min  Charges:  $Gait Training: 8-22 mins                     Carmelia Bake, PT, DPT 03/06/2018 Pager: [phone]  York Ram E 03/06/2018, 1:22 PM

## 2018-03-06 NOTE — Progress Notes (Signed)
Progress Note  Patient Name: [name] Date of Encounter: 03/06/2018  Primary Cardiologist: Fransico Him, MD   Subjective   He is asymptomatic but angry about the whole situation.  Inpatient Medications    Scheduled Meds: . apixaban  5 mg Oral Q12H  . atorvastatin  20 mg Oral q1800  . docusate sodium  100 mg Oral BID  . pindolol  10 mg Oral BID  . tamsulosin  0.4 mg Oral BID   Continuous Infusions: . sodium chloride 100 mL/hr at 03/06/18 0829  . amiodarone 30 mg/hr (03/05/18 1656)   PRN Meds: acetaminophen, bisacodyl, diphenhydrAMINE, HYDROmorphone (DILAUDID) injection, menthol-cetylpyridinium **OR** phenol, methocarbamol **OR** methocarbamol (ROBAXIN) IV, metoCLOPramide **OR** metoCLOPramide (REGLAN) injection, nitroGLYCERIN, ondansetron **OR** ondansetron (ZOFRAN) IV, oxyCODONE, oxyCODONE, polyethylene glycol, sodium phosphate, traMADol   Vital Signs    Vitals:   03/05/18 1010 03/05/18 1512 03/05/18 2335 03/06/18 0504  BP: 108/83 107/71 112/85 119/75  Pulse: 86 94 81 83  Resp:  18 12 14   Temp:  98.8 F (37.1 C) 98.7 F (37.1 C) 98.7 F (37.1 C)  TempSrc:  Oral Oral Oral  SpO2:  (!) 87% 98% 97%  Weight:      Height:        Intake/Output Summary (Last 24 hours) at 03/06/2018 0916 Last data filed at 03/06/2018 0600 Gross per 24 hour  Intake 1190.22 ml  Output 500 ml  Net 690.22 ml   Filed Weights   02/28/18 1121  Weight: 69.2 kg   Telemetry    A-fib with RVR ventricular rates 85-108 - Personally Reviewed  Physical Exam   GEN: No acute distress.   Neck: No JVD Cardiac: iRRR, no murmurs, rubs, or gallops.  Respiratory: Clear to auscultation bilaterally. GI: Soft, nontender, non-distended  MS: mild B/L edema; No deformity. Neuro:  Nonfocal  Psych: Normal affect   Labs    Chemistry Recent Labs  Lab 03/01/18 0521 03/02/18 0525 03/04/18 1305  NA 141 138 141  K 4.3 3.8 3.7  CL 105 104 102  CO2 26 27 28   GLUCOSE 163* 117* 112*  BUN 13  16 25*  CREATININE 0.88 0.91 1.00  CALCIUM 8.5* 8.3* 8.6*  GFRNONAA >60 >60 >60  GFRAA >60 >60 >60  ANIONGAP 10 7 11     Hematology Recent Labs  Lab 03/02/18 0525 03/03/18 0546 03/04/18 1305  WBC 10.2 10.2 10.0  RBC 3.06* 3.48* 3.50*  HGB 9.1* 10.3* 10.2*  HCT 27.5* 31.2* 31.4*  MCV 89.9 89.7 89.7  MCH 29.7 29.6 29.1  MCHC 33.1 33.0 32.5  RDW 14.1 14.2 14.2  PLT 135* 143* 196   Cardiac EnzymesNo results for input(s): TROPONINI in the last 168 hours. No results for input(s): TROPIPOC in the last 168 hours.   BNPNo results for input(s): BNP, PROBNP in the last 168 hours.   DDimer No results for input(s): DDIMER in the last 168 hours.   Radiology    No results found.  Cardiac Studies   Patient Profile     77 y.o. male   Assessment & Plan    1. PAF  - ventricular rates are better controlled, continue amio drip, if no spontaneous cardioversion, plan for a DCCV on Monday, I will place him on DCCV schedule, however the patient still wants to think if he wants to be rate controlled or cardioverted, doesn't seem to have a good understanding of his condition,  Hopefully he   2. Orthostatic hypotension - improved   3. CAD, s/p RCA  BMS x 2 in 2007 - no chest pain  4. HFpEF: EF 50-55%  5. Osteoarthritis status post left total knee arthroplasty: Per Ortho. In setting of above, would hold off on d/c.  For questions or updates, please contact [facility] Please consult [email or web address] for contact info under Cardiology/STEMI.      Signed, Ena Dawley, MD  03/06/2018, 9:16 AM

## 2018-03-06 NOTE — Progress Notes (Signed)
RN was obtaining the patient's consent for cardioversion tomorrow AM when the patient stated he did not feel as though he got enough information for the MD about the procedure. He wants to meet with the MD in the AM and discuss the procedure in more detail before signing the consent form.

## 2018-03-07 NOTE — Progress Notes (Signed)
Physical Therapy Treatment Patient Details Name: [name] MRN: [id number] DOB: [birth year] Today's Date: 03/07/2018    History of Present Illness Pt is a 77 year old male s/p L TKA with post op afib,. H/O Afib, cardioversion 12/23/17. 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    PT Comments     The patient is mobil;izing well. Delayed DC due to afib. To go for TEE[facility] at [facility]. Continue PT  Follow Up Recommendations  Follow surgeon's recommendation for DC plan and follow-up therapies(virtual)     Equipment Recommendations    none   Recommendations for Other Services       Precautions / Restrictions Precautions Precaution Comments: monitor HR,     Mobility  Bed Mobility Overal bed mobility: Independent                Transfers Overall transfer level: Needs assistance Equipment used: Rolling walker (2 wheeled) Transfers: Sit to/from Stand Sit to Stand: Supervision            Ambulation/Gait Ambulation/Gait assistance: Supervision Gait Distance (Feet): 130 Feet Assistive device: Rolling walker (2 wheeled) Gait Pattern/deviations: Step-through pattern     General Gait Details: verbal cues for posture, heel strike, L knee flexion with swing; HR 112-127during gait. RN gave clearNCE TO AMBULATE    Stairs             Wheelchair Mobility    Modified Rankin (Stroke Patients Only)       Balance                                            Cognition Arousal/Alertness: Awake/alert Behavior During Therapy: WFL for tasks assessed/performed Overall Cognitive Status: Within Functional Limits for tasks assessed                                        Exercises Total Joint Exercises Straight Leg Raises: AROM;Left;10 reps;Supine Long Arc Quad: AROM;Left;10 reps;Seated Knee Flexion: AROM;Left;10 reps;Seated    General Comments        Pertinent Vitals/Pain Faces Pain  Scale: Hurts little more Pain Descriptors / Indicators: Discomfort;Sore Pain Intervention(s): Monitored during session;Ice applied    Home Living                      Prior Function            PT Goals (current goals can now be found in the care plan section) Acute Rehab PT Goals Patient Stated Goal: to walk, go home PT Goal Formulation: With patient Time For Goal Achievement: 03/12/18 Potential to Achieve Goals: Good Progress towards PT goals: Progressing toward goals;Goals met and updated - see care plan    Frequency    7X/week      PT Plan Current plan remains appropriate    Co-evaluation              AM-PAC PT "6 Clicks" Daily Activity  Outcome Measure  Difficulty turning over in bed (including adjusting bedclothes, sheets and blankets)?: None Difficulty moving from lying on back to sitting on the side of the bed? : None Difficulty sitting down on and standing up from a chair with arms (e.g., wheelchair, bedside commode, etc,.)?: A Little Help needed  moving to and from a bed to chair (including a wheelchair)?: A Little Help needed walking in hospital room?: A Little Help needed climbing 3-5 steps with a railing? : A Little 6 Click Score: 20    End of Session   Activity Tolerance: Patient tolerated treatment well Patient left: in chair;with call bell/phone within reach Nurse Communication: Mobility status PT Visit Diagnosis: Difficulty in walking, not elsewhere classified (R26.2) Pain - Right/Left: Left Pain - part of body: Knee     Time: 1207-1231 PT Time Calculation (min) (ACUTE ONLY): 24 min  Charges:  $Gait Training: 8-22 mins $Self Care/Home Management: [place] PT [phone]    [name] 03/07/2018, 1:15 PM

## 2018-03-07 NOTE — Progress Notes (Signed)
Patient is scheduled for a cardioversion @ [facility] @ 1415 on Tuesday, 8.20.19. Spoke with Jeannene Patella, RN about setting up [facility] transport for arrival at [facility] @ 1200.

## 2018-03-07 NOTE — Care Management Note (Signed)
Case Management Note  Patient Details  Name: [name] MRN: [id number] Date of Birth: 1940-11-23  Subjective/Objective:                    Action/Plan: Pt will follow Virtual PT, has DME.   Expected Discharge Date:  03/03/18               Expected Discharge Plan:  OP Rehab  In-House Referral:     Discharge planning Services  CM Consult  Post Acute Care Choice:    Choice offered to:     DME Arranged:    DME Agency:     HH Arranged:    HH Agency:     Status of Service:  Completed, signed off  If discussed at [employer] of Stay Meetings, dates discussed:    Additional CommentsPurcell Mouton, RN 03/07/2018, 1:17 PM

## 2018-03-07 NOTE — Progress Notes (Signed)
   Subjective: 7 Days Post-Op Procedure(s) (LRB): LEFT TOTAL KNEE ARTHROPLASTY (Left) Patient reports pain as mild.   Plan is to go Home after hospital stay. He is to undergo a cardioversion today  Objective: Vital signs in last 24 hours: Temp:  [98.5 F (36.9 C)-99.2 F (37.3 C)] 98.5 F (36.9 C) (08/19 0512) Pulse Rate:  [127-128] 128 (08/19 0512) Resp:  [12-16] 12 (08/19 0512) BP: (104-136)/(70-92) 130/84 (08/19 0512) SpO2:  [97 %-98 %] 98 % (08/19 0512)  Intake/Output from previous day: No intake or output data in the 24 hours ending 03/07/18 0658  Intake/Output this shift: No intake/output data recorded.  Labs: Recent Labs    03/04/18 1305  HGB 10.2*   Recent Labs    03/04/18 1305  WBC 10.0  RBC 3.50*  HCT 31.4*  PLT 196   Recent Labs    03/04/18 1305 03/06/18 1401  NA 141 143  K 3.7 4.0  CL 102 107  CO2 28 27  BUN 25* 14  CREATININE 1.00 1.00  GLUCOSE 112* 118*  CALCIUM 8.6* 8.4*   No results for input(s): LABPT, INR in the last 72 hours.  EXAM General - Patient is Alert, Appropriate and Oriented Extremity - Neurologically intact Neurovascular intact Incision: dressing C/D/I No cellulitis present Compartment soft Dressing/Incision - clean, dry, no drainage Motor Function - intact, moving foot and toes well on exam.   Past Medical History:  Diagnosis Date  . CAD (coronary artery disease)    a. 05/2006 Abnl stress test w/ 2-56mm ST dep; b. 05/2006 Cath/PCI: LM nl, LAD 30-40ost, 20-30p, 90d (small), D1 nl, D2 nl, LCX nl, OM1/2 nl, RCA Ca2+, 51m/d (3.5x16 Liberty BMS & 3.5x12 Liberty BMS), EF 50%.  . Chronic diastolic CHF (congestive heart failure) ([place])    a. 10/2017 Echo: EF 50-55%, mild AI, sev dil LA, mod dil RA.  Marland Kitchen Chronic venous insufficiency   . HTN (hypertension)   . PAF (paroxysmal atrial fibrillation) ([place])    a. CHA2DS2VASc 5-->Eliquis; b. Prev WCT on flecainide;  c. 12/2017 s/p DCCV.  Marland Kitchen Pneumothorax   . Ventricular tachycardia (HCC)     a. possibly proarrhythmia from flecainide.    Assessment/Plan: 7 Days Post-Op Procedure(s) (LRB): LEFT TOTAL KNEE ARTHROPLASTY (Left) Principal Problem:   OA (osteoarthritis) of knee Active Problems:   Atrial fibrillation with RVR (HCC)   Up with therapy  Cardioversion today Possible discharge once stable from cardiac standpoint  DVT Prophylaxis - Eliquis Weight-Bearing as tolerated to left leg  Gaynelle Arabian 03/07/2018, 6:58 AM

## 2018-03-07 NOTE — Progress Notes (Addendum)
Progress Note  Patient Name: [name] Date of Encounter: 03/07/2018  Primary Cardiologist: Fransico Him, MD   Subjective   Patient is asymptomatic with no chest discomfort, shortness of breath or lightheadedness. He is frustrated with this delay in his discharge and wants to know what we are going to do about it.   Inpatient Medications    Scheduled Meds: . apixaban  5 mg Oral Q12H  . atorvastatin  20 mg Oral q1800  . docusate sodium  100 mg Oral BID  . pindolol  10 mg Oral BID  . sodium chloride flush  3 mL Intravenous Q12H  . tamsulosin  0.4 mg Oral BID   Continuous Infusions: . sodium chloride 100 mL/hr at 03/06/18 0829  . sodium chloride 250 mL (03/06/18 1131)  . amiodarone 30 mg/hr (03/06/18 2240)   PRN Meds: acetaminophen, bisacodyl, diphenhydrAMINE, HYDROmorphone (DILAUDID) injection, menthol-cetylpyridinium **OR** phenol, methocarbamol **OR** methocarbamol (ROBAXIN) IV, metoCLOPramide **OR** metoCLOPramide (REGLAN) injection, nitroGLYCERIN, ondansetron **OR** ondansetron (ZOFRAN) IV, oxyCODONE, oxyCODONE, polyethylene glycol, sodium chloride flush, sodium phosphate, traMADol   Vital Signs    Vitals:   03/06/18 0504 03/06/18 1453 03/06/18 2105 03/07/18 0512  BP: 119/75 104/70 (!) 136/92 130/84  Pulse: 83 (!) 127 (!) 128 (!) 128  Resp: 14 16 14 12   Temp: 98.7 F (37.1 C) 99.2 F (37.3 C) 98.9 F (37.2 C) 98.5 F (36.9 C)  TempSrc: Oral Oral Oral Oral  SpO2: 97% 97% 97% 98%  Weight:      Height:       No intake or output data in the 24 hours ending 03/07/18 0927 Filed Weights   02/28/18 1121  Weight: 69.2 kg    Telemetry    Atrial flutter in the 120s overnight, this morning rates 90s-110s- Personally Reviewed  ECG    Atrial flutter with 2:1 conduction, 26 bpm- Personally Reviewed  Physical Exam   GEN: No acute distress.   Neck: No JVD Cardiac: Irregularly irregular rhythm, no murmurs, rubs, or gallops.  Respiratory: Clear to  auscultation bilaterally. GI: Soft, nontender, non-distended  MS: left knee and lower leg edema;  Neuro:  Nonfocal  Psych: Normal affect   Labs    Chemistry Recent Labs  Lab 03/02/18 0525 03/04/18 1305 03/06/18 1401  NA 138 141 143  K 3.8 3.7 4.0  CL 104 102 107  CO2 27 28 27   GLUCOSE 117* 112* 118*  BUN 16 25* 14  CREATININE 0.91 1.00 1.00  CALCIUM 8.3* 8.6* 8.4*  GFRNONAA >60 >60 >60  GFRAA >60 >60 >60  ANIONGAP 7 11 9      Hematology Recent Labs  Lab 03/02/18 0525 03/03/18 0546 03/04/18 1305  WBC 10.2 10.2 10.0  RBC 3.06* 3.48* 3.50*  HGB 9.1* 10.3* 10.2*  HCT 27.5* 31.2* 31.4*  MCV 89.9 89.7 89.7  MCH 29.7 29.6 29.1  MCHC 33.1 33.0 32.5  RDW 14.1 14.2 14.2  PLT 135* 143* 196    Cardiac EnzymesNo results for input(s): TROPONINI in the last 168 hours. No results for input(s): TROPIPOC in the last 168 hours.   BNPNo results for input(s): BNP, PROBNP in the last 168 hours.   DDimer No results for input(s): DDIMER in the last 168 hours.   Radiology    No results found.  Cardiac Studies   Echocardiogram 10/21/2017 Study Conclusions - Left ventricle: There was mild concentric hypertrophy. Systolic   function was normal. The estimated ejection fraction was in the   range of 50% to 55%. -  Aortic valve: There was mild regurgitation. - Left atrium: The atrium was severely dilated. - Right ventricle: Pacer wire or catheter noted in right ventricle.   Systolic function was normal. - Right atrium: The atrium was moderately dilated. Pacer wire or   catheter noted in right atrium. - Inferior vena cava: The vessel was dilated. The respirophasic   diameter changes were blunted (< 50%), consistent with elevated   central venous pressure. - Pericardium, extracardiac: A mild pericardial effusion was   identified. Features were not consistent with tamponade   physiology.  Impressions: - LVEF might be underestimated by a-fib with RVR during the    acquisition.  Patient Profile     77 y.o. male with history of CAD as/P RCA stenting 2007, HFpEF, PAF status post recent DCCV 12/2017, hypertension, hyperlipidemia, OSA who is now status post left TKA with low blood pressure and recurrent atrial fibrillation.  Assessment & Plan    Atrial fib/ flutter with RVR -History of paroxysmal atrial fibrillation with wide-complex tachycardia while on flecainide. -Was doing well for years but was noted to be in A. fib in April in the setting of diastolic CHF.  Was started on Eliquis at that time and underwent DCCV in 01/06/2018 -Now in afib/flutter in the 110's-120's, possible related to hyperadrenergic state postoperatively. Left atrium was severely dilated on echo in April which makes it more difficult to maintain sinus rhythm.  -Currently on amiodarone IV load with no spontaneous conversion.  Plan for elective electrical cardioversion, however, there is no room scheduled for today.  He has been scheduled for tomorrow. -Continue Pindolol. HR prior to going into afib looks to have been in the 50's-60's. I am reluctant to increase BB as he may lead to severe bradycardia if he converts. Will discuss with Dr. Stanford Breed.   Orthostatic hypotension -Improved  CAD -S/P BMS x2 to RCA in 2007 -No chest pain. -On pindolol and statin.  Not on aspirin in setting of need for anticoagulation.  Status post left TKA -Management per orthopedic -Recovering well except for afib.    For questions or updates, please contact [place] Please consult [email or web address] for contact info under Cardiology/STEMI.      Signed, Daune Perch, NP  03/07/2018, 9:27 AM   As above, patient seen and examined.  He denies dyspnea or chest pain.  He remains in atrial fibrillation this morning.  It is unclear when his atrial fibrillation began and he was not therapeutically anticoagulated initially.  I would therefore favor TEE to exclude thrombus prior to his cardioversion.  We  will hopefully arrange for tomorrow.  Otherwise continue pindolol and amiodarone.  Continue apixaban.  If he does not hold sinus rhythm then rate control and anticoagulation may be appropriate. Kirk Ruths, MD

## 2018-03-07 NOTE — Care Management Important Message (Signed)
Important Message  Patient Details  Name: [name] MRN: [id number] Date of Birth: Jul 14, 1941   Medicare Important Message Given:  Yes    Kerin Salen 03/07/2018, 12:31 [place] Message  Patient Details  Name: [name] MRN: [id number] Date of Birth: 1941-02-19   Medicare Important Message Given:  Yes    Kerin Salen 03/07/2018, 12:31 PM

## 2018-03-08 ENCOUNTER — Inpatient Hospital Stay (HOSPITAL_COMMUNITY): Payer: Medicare Other | Admitting: Certified Registered Nurse Anesthetist

## 2018-03-08 ENCOUNTER — Encounter (HOSPITAL_COMMUNITY)
Admission: RE | Disposition: A | Discharge status: Home / Self Care | Payer: Self-pay | Source: Ambulatory Visit | Attending: Orthopedic Surgery

## 2018-03-08 ENCOUNTER — Inpatient Hospital Stay (HOSPITAL_COMMUNITY): Payer: Medicare Other

## 2018-03-08 ENCOUNTER — Encounter (HOSPITAL_COMMUNITY): Payer: Self-pay

## 2018-03-08 DIAGNOSIS — I340 Nonrheumatic mitral (valve) insufficiency: Secondary | ICD-10-CM

## 2018-03-08 HISTORY — PX: TEE WITHOUT CARDIOVERSION: SHX5443

## 2018-03-08 HISTORY — PX: CARDIOVERSION: SHX1299

## 2018-03-08 SURGERY — ECHOCARDIOGRAM, TRANSESOPHAGEAL
Anesthesia: General

## 2018-03-08 MED ORDER — AMIODARONE HCL 200 MG PO TABS: 200.0000 mg | ORAL_TABLET | Freq: Every day | ORAL | Status: DC

## 2018-03-08 MED ORDER — AMIODARONE HCL 200 MG PO TABS: 200.0000 mg | ORAL_TABLET | Freq: Every day | ORAL | 0 refills | Status: DC

## 2018-03-08 MED ORDER — PHENYLEPHRINE 40 MCG/ML (10ML) SYRINGE FOR IV PUSH (FOR BLOOD PRESSURE SUPPORT)
PREFILLED_SYRINGE | INTRAVENOUS | Status: DC | PRN
  Administered 2018-03-08 (×2): 80 ug via INTRAVENOUS

## 2018-03-08 MED ORDER — SODIUM CHLORIDE 0.9 % IV SOLN: INTRAVENOUS | Status: DC

## 2018-03-08 MED ORDER — SODIUM CHLORIDE 0.9 % IV SOLN
INTRAVENOUS | Status: DC
  Administered 2018-03-08: 500 mL via INTRAVENOUS

## 2018-03-08 MED ORDER — LIDOCAINE 2% (20 MG/ML) 5 ML SYRINGE
INTRAMUSCULAR | Status: DC | PRN
  Administered 2018-03-08: 60 mg via INTRAVENOUS

## 2018-03-08 MED ORDER — SODIUM CHLORIDE 0.9 % IV SOLN
INTRAVENOUS | Status: DC | PRN
  Administered 2018-03-08: 12:00:00 via INTRAVENOUS

## 2018-03-08 MED ORDER — PROPOFOL 10 MG/ML IV BOLUS
INTRAVENOUS | Status: DC | PRN
  Administered 2018-03-08 (×2): 40 mg via INTRAVENOUS
  Administered 2018-03-08: 60 mg via INTRAVENOUS
  Administered 2018-03-08: 30 mg via INTRAVENOUS
  Administered 2018-03-08: 60 mg via INTRAVENOUS

## 2018-03-08 NOTE — Progress Notes (Signed)
Attempted to have pt sign informed consent this morning. Pt states that he would still like to talk to the MD about the procedure before signing. Incomplete consent placed at the front of pt chart.

## 2018-03-08 NOTE — Transfer of Care (Signed)
Immediate Anesthesia Transfer of Care Note  Patient: [name]  Procedure(s) Performed: TRANSESOPHAGEAL ECHOCARDIOGRAM (TEE) (N/A ) CARDIOVERSION (N/A )  Patient Location: Endoscopy Unit  Anesthesia Type:General  Level of Consciousness: awake, oriented and patient cooperative  Airway & Oxygen Therapy: Patient Spontanous Breathing and Patient connected to nasal cannula oxygen  Post-op Assessment: Report given to RN and Post -op Vital signs reviewed and stable  Post vital signs: Reviewed and stable  Last Vitals:  Vitals Value Taken Time  BP 83/47 03/08/2018  2:11 PM  Temp    Pulse 64 03/08/2018  2:12 PM  Resp 18 03/08/2018  2:12 PM  SpO2 99 % 03/08/2018  2:12 PM  Vitals shown include unvalidated device data.  Last Pain:  Vitals:   03/08/18 1154  TempSrc: Oral  PainSc: 4       Patients Stated Pain Goal: 2 (59/97/74 1423)  Complications: No apparent anesthesia complications

## 2018-03-08 NOTE — Progress Notes (Signed)
Resumed care for this pt at 2300. I agree with previous RN's assessment. Pt resting comfortably at this time. Will continue to monitor.

## 2018-03-08 NOTE — Anesthesia Preprocedure Evaluation (Addendum)
Anesthesia Evaluation  Patient identified by MRN, date of birth, ID band Patient awake    Reviewed: Allergy & Precautions, NPO status , Patient's Chart, lab work & pertinent test results  Airway Mallampati: I  TM Distance: >3 FB Neck ROM: Full    Dental  (+) Missing,    Pulmonary neg pulmonary ROS,    Pulmonary exam normal breath sounds clear to auscultation       Cardiovascular hypertension, Pt. on home beta blockers and Pt. on medications + CAD, + Cardiac Stents (x 1), + Peripheral Vascular Disease and +CHF  Normal cardiovascular exam+ dysrhythmias Atrial Fibrillation + Valvular Problems/Murmurs AI  Rhythm:Regular Rate:Normal  ECG: A-flutter, rate 126  ECHO: LV EF: 50% -   55%   Neuro/Psych negative neurological ROS  negative psych ROS   GI/Hepatic negative GI ROS, Neg liver ROS,   Endo/Other  negative endocrine ROS  Renal/GU negative Renal ROS     Musculoskeletal negative musculoskeletal ROS (+)   Abdominal   Peds  Hematology  (+) anemia , HLD   Anesthesia Other Findings   Reproductive/Obstetrics                            Anesthesia Physical Anesthesia Plan  ASA: IV  Anesthesia Plan: General   Post-op Pain Management:    Induction: Intravenous  PONV Risk Score and Plan: 2 and Propofol infusion and Treatment may vary due to age or medical condition  Airway Management Planned: Nasal Cannula  Additional Equipment:   Intra-op Plan:   Post-operative Plan:   Informed Consent: I have reviewed the patients History and Physical, chart, labs and discussed the procedure including the risks, benefits and alternatives for the proposed anesthesia with the patient or authorized representative who has indicated his/her understanding and acceptance.   Dental advisory given  Plan Discussed with: CRNA  Anesthesia Plan Comments:         Anesthesia Quick Evaluation

## 2018-03-08 NOTE — Progress Notes (Addendum)
Progress Note  Patient Name: [name] Date of Encounter: 03/08/2018  Primary Cardiologist: Fransico Him, MD   Subjective   No complaints. Denies palpitations, CP and dyspnea. No significant post operative knee pain.   Inpatient Medications    Scheduled Meds: . apixaban  5 mg Oral Q12H  . atorvastatin  20 mg Oral q1800  . docusate sodium  100 mg Oral BID  . pindolol  10 mg Oral BID  . sodium chloride flush  3 mL Intravenous Q12H  . tamsulosin  0.4 mg Oral BID   Continuous Infusions: . sodium chloride 10 mL/hr at 03/06/18 0829  . sodium chloride 10 mL/hr at 03/08/18 0600  . amiodarone Stopped (03/08/18 0556)   PRN Meds: acetaminophen, bisacodyl, diphenhydrAMINE, HYDROmorphone (DILAUDID) injection, menthol-cetylpyridinium **OR** phenol, methocarbamol **OR** methocarbamol (ROBAXIN) IV, metoCLOPramide **OR** metoCLOPramide (REGLAN) injection, nitroGLYCERIN, ondansetron **OR** ondansetron (ZOFRAN) IV, oxyCODONE, oxyCODONE, polyethylene glycol, sodium chloride flush, sodium phosphate, traMADol   Vital Signs    Vitals:   03/07/18 1311 03/07/18 2052 03/07/18 2143 03/08/18 0420  BP: 114/65 135/78  118/64  Pulse: 97 (!) 34 98 92  Resp: 18 18  18   Temp: 99 F (37.2 C) 98.1 F (36.7 C)  98 F (36.7 C)  TempSrc: Oral Oral  Oral  SpO2: 97% (!) 79%  97%  Weight:      Height:        Intake/Output Summary (Last 24 hours) at 03/08/2018 0805 Last data filed at 03/08/2018 0600 Gross per 24 hour  Intake 1453.66 ml  Output 1 ml  Net 1452.66 ml   Filed Weights   02/28/18 1121  Weight: 69.2 kg    Telemetry    Atrial fibrillation in the 110s - Personally Reviewed  ECG    Atrial fibrillation - Personally Reviewed  Physical Exam   GEN: No acute distress.   Neck: No JVD Cardiac: irregularly irregular rhythm, mildly tachy rate, no murmurs, rubs, or gallops.  Respiratory: Clear to auscultation bilaterally. GI: Soft, nontender, non-distended  MS: No edema; No  deformity. Neuro:  Nonfocal  Psych: Normal affect   Labs    Chemistry Recent Labs  Lab 03/02/18 0525 03/04/18 1305 03/06/18 1401  NA 138 141 143  K 3.8 3.7 4.0  CL 104 102 107  CO2 27 28 27   GLUCOSE 117* 112* 118*  BUN 16 25* 14  CREATININE 0.91 1.00 1.00  CALCIUM 8.3* 8.6* 8.4*  GFRNONAA >60 >60 >60  GFRAA >60 >60 >60  ANIONGAP 7 11 9      Hematology Recent Labs  Lab 03/02/18 0525 03/03/18 0546 03/04/18 1305  WBC 10.2 10.2 10.0  RBC 3.06* 3.48* 3.50*  HGB 9.1* 10.3* 10.2*  HCT 27.5* 31.2* 31.4*  MCV 89.9 89.7 89.7  MCH 29.7 29.6 29.1  MCHC 33.1 33.0 32.5  RDW 14.1 14.2 14.2  PLT 135* 143* 196    Cardiac EnzymesNo results for input(s): TROPONINI in the last 168 hours. No results for input(s): TROPIPOC in the last 168 hours.   BNPNo results for input(s): BNP, PROBNP in the last 168 hours.   DDimer No results for input(s): DDIMER in the last 168 hours.   Radiology    No results found.  Cardiac Studies   Echocardiogram 10/21/2017 Study Conclusions - Left ventricle: There was mild concentric hypertrophy. Systolic function was normal. The estimated ejection fraction was in the range of 50% to 55%. - Aortic valve: There was mild regurgitation. - Left atrium: The atrium was severely dilated. - Right  ventricle: Pacer wire or catheter noted in right ventricle. Systolic function was normal. - Right atrium: The atrium was moderately dilated. Pacer wire or catheter noted in right atrium. - Inferior vena cava: The vessel was dilated. The respirophasic diameter changes were blunted (<50%), consistent with elevated central venous pressure. - Pericardium, extracardiac: A mild pericardial effusion was identified. Features were not consistent with tamponade physiology.  Impressions: - LVEF might be underestimated by a-fib with RVR during the acquisition.  Patient Profile     77 y.o. male with history of CAD s/p RCA stenting 2007, HFpEF, PAF  status post recent DCCV 12/2017, hypertension, hyperlipidemia, OSA who is now status post left TKA with low blood pressure and recurrent atrial fibrillation.  Assessment & Plan    1. Atrial fib/ flutter with RVR -History of paroxysmal atrial fibrillation with wide-complex tachycardia while on flecainide. -Was doing well for years but was noted to be in A. fib in April in the setting of diastolic CHF.  Was started on Eliquis at that time and underwent DCCV in 01/06/2018 -Now in afib/flutter in the 110's-120's, possible related to hyperadrenergic state postoperatively after knee surgery. Left atrium was severely dilated on echo in April which makes it more difficult to maintain sinus rhythm.  -Currently on amiodarone IV load with no spontaneous conversion. HR in the low 100s-110s.  Plan for elective TEE guided electrical cardioversion today + conversion to oral amiodarone.  - Possible d/c home after cardioversion today if no complications.  - F/u in clinic in 1-2 weeks for repeat EKG. If failed cardioversion/ recurrent afib, would recommend referral to the Afib clinic for further management/ recommendations.    2.  Hypotension -Improved. Normotensive this morning at 118/64.  3.  CAD -S/P BMS x2 to RCA in 2007 -No chest pain. -Continue medical therapy with BB and statin.  Not on aspirin in setting of anticoagulation.  4.  Status post left TKA -Management per orthopedic -Recovering well except for afib.   For questions or updates, please contact [place] Please consult [email or web address] for contact info under Cardiology/STEMI.      Signed, Lyda Jester, PA-C  03/08/2018, 8:05 AM   As above, pt seen and examined; no chest pain or dyspnea; for TEE DCCV today; if successful, pt can be DCed on preadmission meds with addition of amiodarone 200 mg daily and fu in clinic in one week. Would plan to continue amiodarone for 8-12 weeks and then DC to see if he holds sinus on his own. Continue  apixaban. Kirk Ruths

## 2018-03-08 NOTE — Progress Notes (Signed)
   Subjective: 8 Days Post-Op Procedure(s) (LRB): LEFT TOTAL KNEE ARTHROPLASTY (Left) Patient reports pain as mild.   Patient seen in rounds for Dr. Wynelle Link. Patient is well, and has had no acute complaints or problems. Denies chest pain, SOB or calf pain. Voiding without difficulty.  Objective: Vital signs in last 24 hours: Temp:  [98 F (36.7 C)-99 F (37.2 C)] 98 F (36.7 C) (08/20 0420) Pulse Rate:  [34-98] 92 (08/20 0420) Resp:  [18] 18 (08/20 0420) BP: (114-135)/(64-78) 118/64 (08/20 0420) SpO2:  [79 %-97 %] 97 % (08/20 0420)  Intake/Output from previous day:  Intake/Output Summary (Last 24 hours) at 03/08/2018 0744 Last data filed at 03/08/2018 0600 Gross per 24 hour  Intake 1453.66 ml  Output 1 ml  Net 1452.66 ml     Recent Labs    03/06/18 1401  NA 143  K 4.0  CL 107  CO2 27  BUN 14  CREATININE 1.00  GLUCOSE 118*  CALCIUM 8.4*   Exam: General - Patient is Alert and Oriented Extremity - Neurologically intact Neurovascular intact Sensation intact distally Dorsiflexion/Plantar flexion intact Dressing/Incision - clean, dry, no drainage Motor Function - intact, moving foot and toes well on exam.   Past Medical History:  Diagnosis Date  . CAD (coronary artery disease)    a. 05/2006 Abnl stress test w/ 2-7mm ST dep; b. 05/2006 Cath/PCI: LM nl, LAD 30-40ost, 20-30p, 90d (small), D1 nl, D2 nl, LCX nl, OM1/2 nl, RCA Ca2+, 42m/d (3.5x16 [place] BMS & 3.5x12 [place] BMS), EF 50%.  . Chronic diastolic CHF (congestive heart failure) ([place])    a. 10/2017 Echo: EF 50-55%, mild AI, sev dil LA, mod dil RA.  Marland Kitchen Chronic venous insufficiency   . HTN (hypertension)   . PAF (paroxysmal atrial fibrillation) ([place])    a. CHA2DS2VASc 5-->Eliquis; b. Prev WCT on flecainide;  c. 12/2017 s/p DCCV.  Marland Kitchen Pneumothorax   . Ventricular tachycardia ([facility])    a. possibly proarrhythmia from flecainide.    Assessment/Plan: 8 Days Post-Op Procedure(s) (LRB): LEFT TOTAL KNEE ARTHROPLASTY  (Left) Principal Problem:   OA (osteoarthritis) of knee Active Problems:   Atrial fibrillation with RVR (HCC)  Estimated body mass index is 19.58 kg/m as calculated from the following:   Height as of this encounter: 6\' 2"  (1.88 m).   Weight as of this encounter: 69.2 kg.  DVT Prophylaxis - Eliquis Weight-bearing as tolerated  Pt to be transferred to [facility] today for cardioversion at 12 pm. Knee is doing well. We will see him in the office on August 27th for 2 week post-operative follow-up.  Theresa Duty, PA-C Orthopedic Surgery 03/08/2018, 7:44 AM

## 2018-03-08 NOTE — CV Procedure (Signed)
    PROCEDURE NOTE:  Procedure:  Transesophageal echocardiogram Operator:  Fransico Him, MD Indications:  Atrial fibrillation Complications: None  During this procedure the patient is administered a total of Propofol  230mg  and 60mg  of Fentanyl to achieve and maintain moderate conscious sedation.  The patient's heart rate, blood pressure, and oxygen saturation are monitored continuously during the procedure by anesthesia.   Results: Normal LV size and function Normal RV size and function Moderately dilated RA Moderately dilated LA with spontaneous echo contrast.  There was spontaneous echo contrast in the LAA but no evidence of thrombus in the LA or LAA.  Normal TV with mild to moderate TR Normal PV Normal MV with mild to moderate MR with 2 separate jets Trileaflet AV with mild to moderate AV sclerosis and mild to moderate AR. Normal interatrial septum with no evidence of shunt by colorflow dopper  Mild atherosclerotic plaque in the thoracic and ascending aorta.  The patient tolerated the procedure well and went on to DCCV  Signed: Fransico Him, MD [facility]   Electrical Cardioversion Procedure Note [name] [id number] May 11, 1941  Procedure: Electrical Cardioversion Indications:  Atrial Fibrillation  Time Out: Verified patient identification, verified procedure,medications/allergies/relevent history reviewed, required imaging and test results available.  Performed  Procedure Details  The patient was NPO after midnight. Anesthesia was administered at the beside  by Dr.Singer with   Cardioversion was done with synchronized biphasic defibrillation with AP pads with 150watts.  The patient converted to normal sinus rhythm. The patient tolerated the procedure well   IMPRESSION:  Successful cardioversion of atrial fibrillation    Traci Turner 03/08/2018, 10:47 AM

## 2018-03-08 NOTE — Interval H&P Note (Signed)
History and Physical Interval Note:  03/08/2018 10:46 AM  Marcha Solders  has presented today for surgery, with the diagnosis of a fib  The various methods of treatment have been discussed with the patient and family. After consideration of risks, benefits and other options for treatment, the patient has consented to  Procedure(s): TRANSESOPHAGEAL ECHOCARDIOGRAM (TEE) (N/A) CARDIOVERSION (N/A) as a surgical intervention .  The patient's history has been reviewed, patient examined, no change in status, stable for surgery.  I have reviewed the patient's chart and labs.  Questions were answered to the patient's satisfaction.     Fransico Him

## 2018-03-08 NOTE — Progress Notes (Signed)
Pt discharged to home, acknowledged understanding, reviewed care for Left total knee and post Cardioversion. Pt and daughter educated. Remain in SR denies chest pain, VSS stable. SRP, RN

## 2018-03-08 NOTE — Progress Notes (Signed)
Report given to [facility], en route to transport pt to [facility]. SRP, RN

## 2018-03-08 NOTE — Progress Notes (Signed)
*  PRELIMINARY RESULTS* Echocardiogram Echocardiogram Transesophageal has been performed.  [name] 03/08/2018, 2:28 PM

## 2018-03-09 ENCOUNTER — Telehealth: Payer: Self-pay | Admitting: Cardiology

## 2018-03-09 NOTE — Discharge Summary (Signed)
Physician Discharge Summary   Patient ID: [name] MRN: [id number] DOB/AGE: March 06, 1941 77 y.o.  Admit date: 02/28/2018 Discharge date: 03/08/2018  Primary Diagnosis: Osteoarthritis, left knee   Admission Diagnoses:  Past Medical History:  Diagnosis Date  . CAD (coronary artery disease)    a. 05/2006 Abnl stress test w/ 2-88m ST dep; b. 05/2006 Cath/PCI: LM nl, LAD 30-40ost, 20-30p, 90d (small), D1 nl, D2 nl, LCX nl, OM1/2 nl, RCA Ca2+, 913m (3.5x16 Liberty BMS & 3.5x12 Liberty BMS), EF 50%.  . Chronic diastolic CHF (congestive heart failure) ([place]   a. 10/2017 Echo: EF 50-55%, mild AI, sev dil LA, mod dil RA.  . Marland Kitchenhronic venous insufficiency   . HTN (hypertension)   . PAF (paroxysmal atrial fibrillation) ([facility]   a. CHA2DS2VASc 5-->Eliquis; b. Prev WCT on flecainide;  c. 12/2017 s/p DCCV.  . Marland Kitchenneumothorax   . Ventricular tachycardia (HCC)    a. possibly proarrhythmia from flecainide.   Discharge Diagnoses:   Principal Problem:   OA (osteoarthritis) of knee Active Problems:   Atrial fibrillation with RVR (HCC)  Estimated body mass index is 19.58 kg/m as calculated from the following:   Height as of this encounter: '6\' 2"'  (1.88 m).   Weight as of this encounter: 69.2 kg.  Procedure:  Procedure(s) (LRB): TRANSESOPHAGEAL ECHOCARDIOGRAM (TEE) (N/A) CARDIOVERSION (N/A)   Consults: cardiology  HPI: [name] a [age].o. year old male with end stage OA of his left knee with progressively worsening pain and dysfunction. He has constant pain, with activity and at rest and significant functional deficits with difficulties even with ADLs. He has had extensive non-op management including analgesics, injections of cortisone and viscosupplements, and home exercise program, but remains in significant pain with significant dysfunction. Radiographs show bone on bone arthritis lateral and patellofemoral with significant valgus deformity. He presents now for left Total Knee  Arthroplasty.  Laboratory Data: Admission on 02/28/2018, Discharged on 03/08/2018  Component Date Value Ref Range Status  . WBC 03/01/2018 9.7  4.0 - 10.5 K/uL Final  . RBC 03/01/2018 3.30* 4.22 - 5.81 MIL/uL Final  . Hemoglobin 03/01/2018 9.8* 13.0 - 17.0 g/dL Final  . HCT 03/01/2018 29.7* 39.0 - 52.0 % Final  . MCV 03/01/2018 90.0  78.0 - 100.0 fL Final  . MCH 03/01/2018 29.7  26.0 - 34.0 pg Final  . MCHC 03/01/2018 33.0  30.0 - 36.0 g/dL Final  . RDW 03/01/2018 13.7  11.5 - 15.5 % Final  . Platelets 03/01/2018 142* 150 - 400 K/uL Final   Performed at [facility] [place] [id number]. Sodium 03/01/2018 141  135 - 145 mmol/L Final  . Potassium 03/01/2018 4.3  3.5 - 5.1 mmol/L Final  . Chloride 03/01/2018 105  98 - 111 mmol/L Final  . CO2 03/01/2018 26  22 - 32 mmol/L Final  . Glucose, Bld 03/01/2018 163* 70 - 99 mg/dL Final  . BUN 03/01/2018 13  8 - 23 mg/dL Final  . Creatinine, Ser 03/01/2018 0.88  0.61 - 1.24 mg/dL Final  . Calcium 03/01/2018 8.5* 8.9 - 10.3 mg/dL Final  . GFR calc non Af Amer 03/01/2018 >60  >60 mL/min Final  . GFR calc Af Amer 03/01/2018 >60  >60 mL/min Final   Comment: (NOTE) The eGFR has been calculated using the CKD EPI equation. This calculation has not been validated in all clinical situations. eGFR's persistently <60 mL/min signify possible Chronic Kidney Disease.   .Marland Kitchen  Anion gap 03/01/2018 10  5 - 15 Final   Performed at [facility], [place] [address] [place], [place] [address]  . WBC 03/02/2018 10.2  4.0 - 10.5 K/uL Final  . RBC 03/02/2018 3.06* 4.22 - 5.81 MIL/uL Final  . Hemoglobin 03/02/2018 9.1* 13.0 - 17.0 g/dL Final  . HCT 03/02/2018 27.5* 39.0 - 52.0 % Final  . MCV 03/02/2018 89.9  78.0 - 100.0 fL Final  . MCH 03/02/2018 29.7  26.0 - 34.0 pg Final  . MCHC 03/02/2018 33.1  30.0 - 36.0 g/dL Final  . RDW 03/02/2018 14.1  11.5 - 15.5 % Final  . Platelets 03/02/2018 135* 150 - 400  K/uL Final   Performed at [facility], [place] [address] [place], [place] [address]  . Sodium 03/02/2018 138  135 - 145 mmol/L Final  . Potassium 03/02/2018 3.8  3.5 - 5.1 mmol/L Final  . Chloride 03/02/2018 104  98 - 111 mmol/L Final  . CO2 03/02/2018 27  22 - 32 mmol/L Final  . Glucose, Bld 03/02/2018 117* 70 - 99 mg/dL Final  . BUN 03/02/2018 16  8 - 23 mg/dL Final  . Creatinine, Ser 03/02/2018 0.91  0.61 - 1.24 mg/dL Final  . Calcium 03/02/2018 8.3* 8.9 - 10.3 mg/dL Final  . GFR calc non Af Amer 03/02/2018 >60  >60 mL/min Final  . GFR calc Af Amer 03/02/2018 >60  >60 mL/min Final   Comment: (NOTE) The eGFR has been calculated using the CKD EPI equation. This calculation has not been validated in all clinical situations. eGFR's persistently <60 mL/min signify possible Chronic Kidney Disease.   Georgiann Hahn gap 03/02/2018 7  5 - 15 Final   Performed at [facility], [place] [address], [place], [place] [address]  . WBC 03/03/2018 10.2  4.0 - 10.5 K/uL Final  . RBC 03/03/2018 3.48* 4.22 - 5.81 MIL/uL Final  . Hemoglobin 03/03/2018 10.3* 13.0 - 17.0 g/dL Final  . HCT 03/03/2018 31.2* 39.0 - 52.0 % Final  . MCV 03/03/2018 89.7  78.0 - 100.0 fL Final  . MCH 03/03/2018 29.6  26.0 - 34.0 pg Final  . MCHC 03/03/2018 33.0  30.0 - 36.0 g/dL Final  . RDW 03/03/2018 14.2  11.5 - 15.5 % Final  . Platelets 03/03/2018 143* 150 - 400 K/uL Final   Performed at [facility], [place] [address] [place], [place] [address]  . WBC 03/04/2018 10.0  4.0 - 10.5 K/uL Final  . RBC 03/04/2018 3.50* 4.22 - 5.81 MIL/uL Final  . Hemoglobin 03/04/2018 10.2* 13.0 - 17.0 g/dL Final  . HCT 03/04/2018 31.4* 39.0 - 52.0 % Final  . MCV 03/04/2018 89.7  78.0 - 100.0 fL Final  . MCH 03/04/2018 29.1  26.0 - 34.0 pg Final  . MCHC 03/04/2018 32.5  30.0 - 36.0 g/dL Final  . RDW 03/04/2018 14.2  11.5 - 15.5 % Final  . Platelets 03/04/2018 196  150 - 400 K/uL Final   Performed  at [facility], [place] [address] [place],  [address]  . Sodium 03/04/2018 141  135 - 145 mmol/L Final  . Potassium 03/04/2018 3.7  3.5 - 5.1 mmol/L Final  . Chloride 03/04/2018 102  98 - 111 mmol/L Final  . CO2 03/04/2018 28  22 - 32 mmol/L Final  . Glucose, Bld 03/04/2018 112* 70 - 99 mg/dL Final  . BUN 03/04/2018 25* 8 - 23 mg/dL Final  . Creatinine, Ser 03/04/2018  1.00  0.61 - 1.24 mg/dL Final  . Calcium 03/04/2018 8.6* 8.9 - 10.3 mg/dL Final  . GFR calc non Af Amer 03/04/2018 >60  >60 mL/min Final  . GFR calc Af Amer 03/04/2018 >60  >60 mL/min Final   Comment: (NOTE) The eGFR has been calculated using the CKD EPI equation. This calculation has not been validated in all clinical situations. eGFR's persistently <60 mL/min signify possible Chronic Kidney Disease.   Georgiann Hahn gap 03/04/2018 11  5 - 15 Final   Performed at [facility], [place] [address], [place], [place] [address]  . Magnesium 03/05/2018 1.9  1.7 - 2.4 mg/dL Final   Performed at [place] [address] [place], [place] [address]  . Sodium 03/06/2018 143  135 - 145 mmol/L Final  . Potassium 03/06/2018 4.0  3.5 - 5.1 mmol/L Final  . Chloride 03/06/2018 107  98 - 111 mmol/L Final  . CO2 03/06/2018 27  22 - 32 mmol/L Final  . Glucose, Bld 03/06/2018 118* 70 - 99 mg/dL Final  . BUN 03/06/2018 14  8 - 23 mg/dL Final  . Creatinine, Ser 03/06/2018 1.00  0.61 - 1.24 mg/dL Final  . Calcium 03/06/2018 8.4* 8.9 - 10.3 mg/dL Final  . GFR calc non Af Amer 03/06/2018 >60  >60 mL/min Final  . GFR calc Af Amer 03/06/2018 >60  >60 mL/min Final   Comment: (NOTE) The eGFR has been calculated using the CKD EPI equation. This calculation has not been validated in all clinical situations. eGFR's persistently <60 mL/min signify possible Chronic Kidney Disease.   Georgiann Hahn gap 03/06/2018 9  5 - 15 Final   Performed at [facility], [place] [address]  [place], [place] [address]  Hospital Outpatient Visit on 02/21/2018  Component Date Value Ref Range Status  . aPTT 02/21/2018 38* 24 - 36 seconds Final   Comment:        IF BASELINE aPTT IS ELEVATED, SUGGEST PATIENT RISK ASSESSMENT BE USED TO DETERMINE APPROPRIATE ANTICOAGULANT THERAPY. Performed at [facility], [place] [address] [place], [place] [address]   . WBC 02/21/2018 5.1  4.0 - 10.5 K/uL Final  . RBC 02/21/2018 4.22  4.22 - 5.81 MIL/uL Final  . Hemoglobin 02/21/2018 12.3* 13.0 - 17.0 g/dL Final  . HCT 02/21/2018 38.4* 39.0 - 52.0 % Final  . MCV 02/21/2018 91.0  78.0 - 100.0 fL Final  . MCH 02/21/2018 29.1  26.0 - 34.0 pg Final  . MCHC 02/21/2018 32.0  30.0 - 36.0 g/dL Final  . RDW 02/21/2018 14.1  11.5 - 15.5 % Final  . Platelets 02/21/2018 232  150 - 400 K/uL Final   Performed at [facility], [place] [address] [place], [place] [address]  . Sodium 02/21/2018 144  135 - 145 mmol/L Final  . Potassium 02/21/2018 4.1  3.5 - 5.1 mmol/L Final  . Chloride 02/21/2018 109  98 - 111 mmol/L Final  . CO2 02/21/2018 29  22 - 32 mmol/L Final  . Glucose, Bld 02/21/2018 100* 70 - 99 mg/dL Final  . BUN 02/21/2018 16  8 - 23 mg/dL Final  . Creatinine, Ser 02/21/2018 1.01  0.61 - 1.24 mg/dL Final  . Calcium 02/21/2018 9.1  8.9 - 10.3 mg/dL Final  . Total Protein 02/21/2018 6.3* 6.5 - 8.1 g/dL Final  . Albumin 02/21/2018 3.7  3.5 - 5.0 g/dL Final  . AST 02/21/2018 31  15 - 41 U/L Final  .  ALT 02/21/2018 20  0 - 44 U/L Final  . Alkaline Phosphatase 02/21/2018 57  38 - 126 U/L Final  . Total Bilirubin 02/21/2018 0.5  0.3 - 1.2 mg/dL Final  . GFR calc non Af Amer 02/21/2018 >60  >60 mL/min Final  . GFR calc Af Amer 02/21/2018 >60  >60 mL/min Final   Comment: (NOTE) The eGFR has been calculated using the CKD EPI equation. This calculation has not been validated in all clinical situations. eGFR's persistently <60 mL/min signify possible Chronic Kidney Disease.     Georgiann Hahn gap 02/21/2018 6  5 - 15 Final   Performed at [facility], [place] [address], [place], [place] [address]  . Prothrombin Time 02/21/2018 15.3* 11.4 - 15.2 seconds Final  . INR 02/21/2018 1.22   Final   Performed at [facility], [place] [address] [place], [place] [address]  . ABO/RH(D) 02/21/2018 O POS   Final  . Antibody Screen 02/21/2018 NEG   Final  . Sample Expiration 02/21/2018 03/03/2018   Final  . Extend sample reason 02/21/2018    Final                   Value:NO TRANSFUSIONS OR PREGNANCY IN THE PAST 3 MONTHS Performed at [facility], [place] [address], [place], [place] [address]   . MRSA, PCR 02/21/2018 NEGATIVE  NEGATIVE Final  . Staphylococcus aureus 02/21/2018 NEGATIVE  NEGATIVE Final   Comment: (NOTE) The Xpert SA Assay (FDA approved for NASAL specimens in patients 89 years of age and older), is one component of a comprehensive surveillance program. It is not intended to diagnose infection nor to guide or monitor treatment. Performed at [facility], [place] [address] [place], [place] [address]   . Color, Urine 02/21/2018 YELLOW  YELLOW Final  . APPearance 02/21/2018 CLEAR  CLEAR Final  . Specific Gravity, Urine 02/21/2018 1.010  1.005 - 1.030 Final  . pH 02/21/2018 5.0  5.0 - 8.0 Final  . Glucose, UA 02/21/2018 NEGATIVE  NEGATIVE mg/dL Final  . Hgb urine dipstick 02/21/2018 NEGATIVE  NEGATIVE Final  . Bilirubin Urine 02/21/2018 NEGATIVE  NEGATIVE Final  . Ketones, ur 02/21/2018 NEGATIVE  NEGATIVE mg/dL Final  . Protein, ur 02/21/2018 NEGATIVE  NEGATIVE mg/dL Final  . Nitrite 02/21/2018 NEGATIVE  NEGATIVE Final  . Leukocytes, UA 02/21/2018 TRACE* NEGATIVE Final  . RBC / HPF 02/21/2018 0-5  0 - 5 RBC/hpf Final  . WBC, UA 02/21/2018 6-10  0 - 5 WBC/hpf Final  . Bacteria, UA 02/21/2018 NONE SEEN  NONE SEEN Final  . Squamous Epithelial / LPF 02/21/2018 0-5  0 - 5 Final  . Mucus 02/21/2018  PRESENT   Final  . Hyaline Casts, UA 02/21/2018 PRESENT   Final   Performed at [facility], [place] [address], [place], [place] [address]     X-Rays:No results found.  EKG: Orders placed or performed during the hospital encounter of 02/28/18  . EKG 12-Lead  . EKG 12-Lead  . EKG 12-Lead  . EKG 12-Lead  . EKG 12-Lead  . EKG 12-Lead     Hospital Course: KEIJUAN SCHELLHASE is a 78 y.o. who was admitted to [facility]. They were brought to the operating room on 02/28/2018 - 03/08/2018 and underwent Procedure(s): TRANSESOPHAGEAL ECHOCARDIOGRAM (TEE) CARDIOVERSION.  Patient tolerated the procedure well and was later transferred to the recovery room and then to the orthopaedic floor for postoperative care.  They were  given PO and IV analgesics for pain control following their surgery.  They were given 24 hours of postoperative antibiotics of  Anti-infectives (From admission, onward)   Start     Dose/Rate Route Frequency Ordered Stop   02/28/18 2000  ceFAZolin (ANCEF) IVPB 2g/100 mL premix     2 g 200 mL/hr over 30 Minutes Intravenous Every 6 hours 02/28/18 1726 03/01/18 0242   02/28/18 1115  ceFAZolin (ANCEF) IVPB 2g/100 mL premix     2 g 200 mL/hr over 30 Minutes Intravenous On call to O.R. 02/28/18 1114 02/28/18 1418     and started on DVT prophylaxis in the form of Eliquis.   PT and OT were ordered for total joint protocol.  Discharge planning consulted to help with postop disposition and equipment needs.  Patient had a decent night on the evening of surgery. He started to get up OOB with therapy on POD #1. Hemovac drain was pulled without difficulty. On POD #2, dressing was changed and the incision was clean, dry, and intact with no drainage. Pt continued to work with therapy into POD #3. Pt had hypotensive episode while working with occupational therapy on day three, bolus was ordered and given. Cardiology was consulted on POD #4 for hypotensive episode and soft blood  pressures with hx of paroxysmal atrial fibrillation. Pt was seen by Dr. Lyman Bishop, MD and was found to have recurrent post-operative atrial fibrillation. Pt was started on an amiodarone drip, it was determined that if he did not convert on the drip a repeat DCCV would be considered. Pt did not convert back to sinus rhythm over the weekend, continued to work with therapy and was progressing well from an orthopedic standpoint. On POD #8, it was decided by cardiology to proceed with DCCV. Pt was transferred to [facility] for procedure. The DCCV was successful, and pt was cleared for discharge from cardiology standpoint by Kirk Ruths, MD with oral amiodarone for rhythm control. Pt was also cleared for discharge from an orthopedic standpoint. He was discharged to home with his daughter in stable condition.  Diet: Cardiac diet Activity:WBAT Follow-up: in 2 weeks with Dr. Wynelle Link; f/u in 1 week with cardiology Disposition - Home with virtual physical therapy Discharged Condition: stable   Discharge Instructions    Call MD / Call 911   Complete by:  As directed    If you experience chest pain or shortness of breath, CALL 911 and be transported to the hospital emergency room.  If you develope a fever above 101 F, pus (white drainage) or increased drainage or redness at the wound, or calf pain, call your surgeon's office.   Change dressing   Complete by:  As directed    Change the dressing daily with sterile 4 x 4 inch gauze dressing and apply TED hose.   Constipation Prevention   Complete by:  As directed    Drink plenty of fluids.  Prune juice may be helpful.  You may use a stool softener, such as Colace (over the counter) 100 mg twice a day.  Use MiraLax (over the counter) for constipation as needed.   Diet - low sodium heart healthy   Complete by:  As directed    Discharge instructions   Complete by:  As directed    Dr. Gaynelle Arabian Total Joint Specialist [facility] [address], [place], [place] [address] [phone]  TOTAL KNEE REPLACEMENT POSTOPERATIVE DIRECTIONS  Knee Rehabilitation, Guidelines Following Surgery  Results after  knee surgery are often greatly improved when you follow the exercise, range of motion and muscle strengthening exercises prescribed by your doctor. Safety measures are also important to protect the knee from further injury. Any time any of these exercises cause you to have increased pain or swelling in your knee joint, decrease the amount until you are comfortable again and slowly increase them. If you have problems or questions, call your caregiver or physical therapist for advice.   HOME CARE INSTRUCTIONS  Remove items at home which could result in a fall. This includes throw rugs or furniture in walking pathways.  ICE to the affected knee every three hours for 30 minutes at a time and then as needed for pain and swelling.  Continue to use ice on the knee for pain and swelling from surgery. You may notice swelling that will progress down to the foot and ankle.  This is normal after surgery.  Elevate the leg when you are not up walking on it.   Continue to use the breathing machine which will help keep your temperature down.  It is common for your temperature to cycle up and down following surgery, especially at night when you are not up moving around and exerting yourself.  The breathing machine keeps your lungs expanded and your temperature down. Do not place pillow under knee, focus on keeping the knee straight while resting   DIET You may resume your previous home diet once your are discharged from the hospital.  DRESSING / WOUND CARE / SHOWERING You may shower 3 days after surgery, but keep the wounds dry during showering.  You may use an occlusive plastic wrap (Press'n Seal for example), NO SOAKING/SUBMERGING IN THE BATHTUB.  If the bandage gets wet, change with a clean dry gauze.  If the incision gets wet, pat the  wound dry with a clean towel. You may start showering once you are discharged home but do not submerge the incision under water. Just pat the incision dry and apply a dry gauze dressing on daily. Change the surgical dressing daily and reapply a dry dressing each time.  ACTIVITY Walk with your walker as instructed. Use walker as long as suggested by your caregivers. Avoid periods of inactivity such as sitting longer than an hour when not asleep. This helps prevent blood clots.  You may resume a sexual relationship in one month or when given the OK by your doctor.  You may return to work once you are cleared by your doctor.  Do not drive a car for 6 weeks or until released by you surgeon.  Do not drive while taking narcotics.  WEIGHT BEARING Weight bearing as tolerated with assist device (walker, cane, etc) as directed, use it as long as suggested by your surgeon or therapist, typically at least 4-6 weeks.  POSTOPERATIVE CONSTIPATION PROTOCOL Constipation - defined medically as fewer than three stools per week and severe constipation as less than one stool per week.  One of the most common issues patients have following surgery is constipation.  Even if you have a regular bowel pattern at home, your normal regimen is likely to be disrupted due to multiple reasons following surgery.  Combination of anesthesia, postoperative narcotics, change in appetite and fluid intake all can affect your bowels.  In order to avoid complications following surgery, here are some recommendations in order to help you during your recovery period.  Colace (docusate) - Pick up an over-the-counter form of Colace or another stool softener and  take twice a day as long as you are requiring postoperative pain medications.  Take with a full glass of water daily.  If you experience loose stools or diarrhea, hold the colace until you stool forms back up.  If your symptoms do not get better within 1 week or if they get worse,  check with your doctor.  Dulcolax (bisacodyl) - Pick up over-the-counter and take as directed by the product packaging as needed to assist with the movement of your bowels.  Take with a full glass of water.  Use this product as needed if not relieved by Colace only.   MiraLax (polyethylene glycol) - Pick up over-the-counter to have on hand.  MiraLax is a solution that will increase the amount of water in your bowels to assist with bowel movements.  Take as directed and can mix with a glass of water, juice, soda, coffee, or tea.  Take if you go more than two days without a movement. Do not use MiraLax more than once per day. Call your doctor if you are still constipated or irregular after using this medication for 7 days in a row.  If you continue to have problems with postoperative constipation, please contact the office for further assistance and recommendations.  If you experience "the worst abdominal pain ever" or develop nausea or vomiting, please contact the office immediatly for further recommendations for treatment.  ITCHING  If you experience itching with your medications, try taking only a single pain pill, or even half a pain pill at a time.  You can also use Benadryl over the counter for itching or also to help with sleep.   TED HOSE STOCKINGS Wear the elastic stockings on both legs for three weeks following surgery during the day but you may remove then at night for sleeping.  MEDICATIONS See your medication summary on the "After Visit Summary" that the nursing staff will review with you prior to discharge.  You may have some home medications which will be placed on hold until you complete the course of blood thinner medication.  It is important for you to complete the blood thinner medication as prescribed by your surgeon.  Continue your approved medications as instructed at time of discharge.  PRECAUTIONS If you experience chest pain or shortness of breath - call 911 immediately for  transfer to the hospital emergency department.  If you develop a fever greater that 101 F, purulent drainage from wound, increased redness or drainage from wound, foul odor from the wound/dressing, or calf pain - CONTACT YOUR SURGEON.                                                   FOLLOW-UP APPOINTMENTS Make sure you keep all of your appointments after your operation with your surgeon and caregivers. You should call the office at the above phone number and make an appointment for approximately two weeks after the date of your surgery or on the date instructed by your surgeon outlined in the "After Visit Summary".   RANGE OF MOTION AND STRENGTHENING EXERCISES  Rehabilitation of the knee is important following a knee injury or an operation. After just a few days of immobilization, the muscles of the thigh which control the knee become weakened and shrink (atrophy). Knee exercises are designed to build up the tone and strength of the  thigh muscles and to improve knee motion. Often times heat used for twenty to thirty minutes before working out will loosen up your tissues and help with improving the range of motion but do not use heat for the first two weeks following surgery. These exercises can be done on a training (exercise) mat, on the floor, on a table or on a bed. Use what ever works the best and is most comfortable for you Knee exercises include:  Leg Lifts - While your knee is still immobilized in a splint or cast, you can do straight leg raises. Lift the leg to 60 degrees, hold for 3 sec, and slowly lower the leg. Repeat 10-20 times 2-3 times daily. Perform this exercise against resistance later as your knee gets better.  Quad and Hamstring Sets - Tighten up the muscle on the front of the thigh (Quad) and hold for 5-10 sec. Repeat this 10-20 times hourly. Hamstring sets are done by pushing the foot backward against an object and holding for 5-10 sec. Repeat as with quad sets.  Leg Slides: Lying  on your back, slowly slide your foot toward your buttocks, bending your knee up off the floor (only go as far as is comfortable). Then slowly slide your foot back down until your leg is flat on the floor again. Angel Wings: Lying on your back spread your legs to the side as far apart as you can without causing discomfort.  A rehabilitation program following serious knee injuries can speed recovery and prevent re-injury in the future due to weakened muscles. Contact your doctor or a physical therapist for more information on knee rehabilitation.   IF YOU ARE TRANSFERRED TO A SKILLED REHAB FACILITY If the patient is transferred to a skilled rehab facility following release from the hospital, a list of the current medications will be sent to the facility for the patient to continue.  When discharged from the skilled rehab facility, please have the facility set up the patient's [place] prior to being released. Also, the skilled facility will be responsible for providing the patient with their medications at time of release from the facility to include their pain medication, the muscle relaxants, and their blood thinner medication. If the patient is still at the rehab facility at time of the two week follow up appointment, the skilled rehab facility will also need to assist the patient in arranging follow up appointment in our office and any transportation needs.  MAKE SURE YOU:  Understand these instructions.  Get help right away if you are not doing well or get worse.    Pick up stool softner and laxative for home use following surgery while on pain medications. Do not submerge incision under water. Please use good hand washing techniques while changing dressing each day. May shower starting three days after surgery. Please use a clean towel to pat the incision dry following showers. Continue to use ice for pain and swelling after surgery. Do not use any lotions or creams on the  incision until instructed by your surgeon.   Do not put a pillow under the knee. Place it under the heel.   Complete by:  As directed    Driving restrictions   Complete by:  As directed    No driving for two weeks   TED hose   Complete by:  As directed    Use stockings (TED hose) for three weeks on both leg(s).  You may remove them at night for sleeping.  Weight bearing as tolerated   Complete by:  As directed      Allergies as of 03/08/2018   No Known Allergies     Medication List    TAKE these medications   amiodarone 200 MG tablet Commonly known as:  PACERONE Take 1 tablet (200 mg total) by mouth daily.   apixaban 5 MG Tabs tablet Commonly known as:  ELIQUIS Take 1 tablet (5 mg total) by mouth 2 (two) times daily.   atorvastatin 20 MG tablet Commonly known as:  LIPITOR TAKE ONE TABLET (20 MG) BY MOUTH DAILY.   B-12 2500 MCG Tabs Take 2,500 mcg by mouth daily.   CO Q 10 PO Take 1 tablet by mouth daily.   [facility] MAGNESIUM 250 MG Tabs Generic drug:  Magnesium Take 250 mg by mouth daily.   fish oil-omega-3 fatty acids 1000 MG capsule Take 2 g by mouth daily.   furosemide 20 MG tablet Commonly known as:  LASIX Take 1 tablet (20 mg total) by mouth daily. Take extra 20 mg once a day as needed for edema   glucosamine-chondroitin 500-400 MG tablet Take 1 tablet by mouth 2 (two) times daily.   methocarbamol 500 MG tablet Commonly known as:  ROBAXIN Take 1 tablet (500 mg total) by mouth every 6 (six) hours as needed for muscle spasms.   multivitamin tablet Take 1 tablet by mouth daily.   nitroGLYCERIN 0.4 MG SL tablet Commonly known as:  NITROSTAT Place 1 tablet (0.4 mg total) under the tongue every 5 (five) minutes as needed for chest pain.   oxyCODONE 5 MG immediate release tablet Commonly known as:  Oxy IR/ROXICODONE Take 1-2 tablets (5-10 mg total) by mouth every 6 (six) hours as needed for moderate pain (pain score 4-6).   pindolol 10 MG tablet Commonly  known as:  VISKEN Take 1 tablet (10 mg total) by mouth 2 (two) times daily.   tamsulosin 0.4 MG Caps capsule Commonly known as:  FLOMAX Take 0.4 mg by mouth 2 (two) times daily.   traMADol 50 MG tablet Commonly known as:  ULTRAM Take 1-2 tablets (50-100 mg total) by mouth every 6 (six) hours as needed for moderate pain (if oxycodone alone not sufficient).   VITAMIN C PO Take 1 tablet by mouth daily.   Vitamin D3 2000 units Tabs Take 2,000 Units by mouth daily.            Discharge Care Instructions  (From admission, onward)         Start     Ordered   03/03/18 0000  Weight bearing as tolerated     03/03/18 0735   03/03/18 0000  Change dressing    Comments:  Change the dressing daily with sterile 4 x 4 inch gauze dressing and apply TED hose.   03/03/18 0735         Follow-up Information    Gaynelle Arabian, MD. Schedule an appointment as soon as possible for a visit on 03/15/2018.   Specialty:  Orthopedic Surgery Contact information: [address] [place] [address] [phone]        Lelon Perla, MD Follow up in 1 week(s).   Specialty:  Cardiology Contact information: [address] [place] [address] [phone]           Signed: Theresa Duty, PA-C Orthopaedic Surgery 03/09/2018, 9:05 AM

## 2018-03-09 NOTE — Anesthesia Postprocedure Evaluation (Signed)
Anesthesia Post Note  Patient: [name]  Procedure(s) Performed: TRANSESOPHAGEAL ECHOCARDIOGRAM (TEE) (N/A ) CARDIOVERSION (N/A )     Patient location during evaluation: PACU Anesthesia Type: General Level of consciousness: awake and alert Pain management: pain level controlled Vital Signs Assessment: post-procedure vital signs reviewed and stable Respiratory status: spontaneous breathing, nonlabored ventilation, respiratory function stable and patient connected to nasal cannula oxygen Cardiovascular status: blood pressure returned to baseline and stable Postop Assessment: no apparent nausea or vomiting Anesthetic complications: no    Last Vitals:  Vitals:   03/08/18 1409 03/08/18 1418  BP:  (!) 99/56  Pulse:  83  Resp: (!) 24 12  Temp: 37 C   SpO2: 100% 98%    Last Pain:  Vitals:   03/08/18 1418  TempSrc:   PainSc: 3                  Obbie Lewallen P Medardo Hassing

## 2018-03-09 NOTE — Telephone Encounter (Signed)
RN spoke to [place] -- ( daughters of patient) [name] gave permission to speak to  Her sister [name].     [name] was calling she called because she was  trying to get an appointment for next week per discharge instruction from the hospital by orthopaedic.    Aware  Will contact them back with an appointment with  physician or extenders.

## 2018-03-09 NOTE — Telephone Encounter (Addendum)
daughter aware Notified awaiting for a cancellation.

## 2018-03-09 NOTE — Telephone Encounter (Signed)
° °  Patients daughter calling to request appointment with Dr Stanford Breed in 1 week per discharge summary. Offered to schedule with primary cardiologist Dr Jake Church declined. Offered APP staff, however daughter is adamant exactly 1 week appt needed. Please advise Please call  Daughter at [phone]

## 2018-03-10 NOTE — Telephone Encounter (Signed)
SPOKE TO DAUGHTER SANDY. APPOINTMENT AVAILABLE  FOR 03/16/18 AT 8:20 AM WITH DR TURNER. VERBALIZED UNDERSTANDING.

## 2018-03-15 ENCOUNTER — Encounter: Payer: Self-pay | Admitting: Cardiology

## 2018-03-15 ENCOUNTER — Other Ambulatory Visit: Payer: Self-pay

## 2018-03-15 NOTE — Patient Outreach (Signed)
[facility]) Care Management  03/15/2018  [name] 10-31-1940 [id number]   EMMI- General Discharge RED ON EMMI ALERT Day # 4  Date: 8/226/19 Red Alert Reason:  Read discharge papers? No  Sad/hopeless/anxious/empty? Yes    Outreach attempt: spoke with patient. He is able to verify HIPAA.  Discussed red alerts with patient.  He states that he did not read his discharge instruction as he gave his instructions to his daughter who handles those things for him.  He states that he does not have any problems with depression but is just dealing with his knee replacement and limited mobility right now.  Patient states he will see his surgeon today and has transportation to appointment. He has no questions or concerns about medications.    Discussed [facility] services.  Patient declined need at this time.   Plan: RN CM will close case.    Jone Baseman, RN, MSN [facility] Care Management Care Management Coordinator Direct Line [phone] Toll Free: [phone]  Fax: [phone]

## 2018-03-16 ENCOUNTER — Encounter: Payer: Self-pay | Admitting: Cardiology

## 2018-03-16 ENCOUNTER — Ambulatory Visit (INDEPENDENT_AMBULATORY_CARE_PROVIDER_SITE_OTHER): Payer: Medicare Other | Admitting: Cardiology

## 2018-03-16 VITALS — BP 114/70 | HR 54 | Ht 74.0 in | Wt 156.1 lb

## 2018-03-16 DIAGNOSIS — I5032 Chronic diastolic (congestive) heart failure: Secondary | ICD-10-CM | POA: Diagnosis not present

## 2018-03-16 DIAGNOSIS — I4819 Other persistent atrial fibrillation: Secondary | ICD-10-CM

## 2018-03-16 DIAGNOSIS — I481 Persistent atrial fibrillation: Secondary | ICD-10-CM | POA: Diagnosis not present

## 2018-03-16 DIAGNOSIS — I2510 Atherosclerotic heart disease of native coronary artery without angina pectoris: Secondary | ICD-10-CM

## 2018-03-16 DIAGNOSIS — E785 Hyperlipidemia, unspecified: Secondary | ICD-10-CM

## 2018-03-16 DIAGNOSIS — I10 Essential (primary) hypertension: Secondary | ICD-10-CM | POA: Diagnosis not present

## 2018-03-16 DIAGNOSIS — I77810 Thoracic aortic ectasia: Secondary | ICD-10-CM

## 2018-03-16 DIAGNOSIS — I712 Thoracic aortic aneurysm, without rupture, unspecified: Secondary | ICD-10-CM | POA: Insufficient documentation

## 2018-03-16 NOTE — Progress Notes (Signed)
Cardiology Office Note:    Date:  03/16/2018   ID:  [name], DOB Aug 27, 1940, MRN [id number]  PCP:  Seward Carol, MD  Cardiologist:  Fransico Him, MD    Referring MD: No ref. provider found   Chief Complaint  Patient presents with  . Atrial Fibrillation  . Hypertension  . Coronary Artery Disease  . Hyperlipidemia    History of Present Illness:    [name] is a 77 y.o. 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 Diagnosed in April with acute diastolic CHF. Echocardiogram showed low normal LV function with EF 50-55% was felt to possibly be underestimated due to atrial fibrillation. He had mild AR, severe left atrial enlargement and a mild pericardial effusion. Healso was back inatrial fibrillation andwas started onEliquis. 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   He recently was hospitalized after a left total knee replacement which was complicated by recurrent atrial fibrillation with RVR.  He underwent TEE cardioversion to normal sinus rhythm.  He was loaded with amiodarone and is now back for follow-up. He denies any chest pain or pressure, SOB, DOE, PND, orthopnea, LE edema, dizziness, palpitations or syncope. He is compliant with his meds and is tolerating meds with no SE.     Past Medical History:  Diagnosis Date  . Ascending aorta dilatation (HCC)    45mm by chest CT angio 10/2017  . CAD (coronary artery disease)    a. 05/2006 Abnl stress test w/ 2-36mm ST dep; b. 05/2006 Cath/PCI: LM nl, LAD 30-40ost, 20-30p, 90d (small), D1 nl, D2 nl, LCX nl, OM1/2 nl, RCA  Ca2+, 80m/d (3.5x16 [place] BMS & 3.5x12 [place] BMS), EF 50%.  . Chronic diastolic CHF (congestive heart failure) ([place])    a. 10/2017 Echo: EF 50-55%, mild AI, sev dil LA, mod dil RA.  Marland Kitchen Chronic venous insufficiency   . HTN (hypertension)   . PAF (paroxysmal atrial fibrillation) ([place])    a. CHA2DS2VASc 5-->Eliquis; b. Prev WCT on flecainide;  c. 12/2017 s/p DCCV.  Marland Kitchen Pneumothorax   . Ventricular tachycardia (HCC)    a. possibly proarrhythmia from flecainide.    Past Surgical History:  Procedure Laterality Date  . CARDIOVERSION N/A 12/23/2017   Procedure: CARDIOVERSION;  Surgeon: Sueanne Margarita, MD;  Location[facility] ENDOSCOPY;  Service: Cardiovascular;  Laterality: N/A;  . CARDIOVERSION N/A 03/08/2018   Procedure: CARDIOVERSION;  Surgeon: Sueanne Margarita, MD;  Location: [facility] ENDOSCOPY;  Service: Cardiovascular;  Laterality: N/A;  . INGUINAL HERNIA REPAIR     x2  . TEE WITHOUT CARDIOVERSION N/A 03/08/2018   Procedure: TRANSESOPHAGEAL ECHOCARDIOGRAM (TEE);  Surgeon: Sueanne Margarita, MD;  Location[facility] ENDOSCOPY;  Service: Cardiovascular;  Laterality: N/A;  . TOTAL KNEE ARTHROPLASTY Left 02/28/2018   Procedure: LEFT TOTAL KNEE ARTHROPLASTY;  Surgeon: Gaynelle Arabian, MD;  Location: [facility] ORS;  Service: Orthopedics;  Laterality: Left;    Current Medications: Current Meds  Medication Sig  . amiodarone (PACERONE) 200 MG tablet Take 1 tablet (200 mg total) by mouth daily.  Marland Kitchen apixaban (ELIQUIS) 5 MG TABS tablet Take 1 tablet (5 mg total) by mouth 2 (  two) times daily.  . Ascorbic Acid (VITAMIN C PO) Take 1 tablet by mouth daily.   Marland Kitchen atorvastatin (LIPITOR) 20 MG tablet TAKE ONE TABLET (20 MG) BY MOUTH DAILY.  Marland Kitchen Cholecalciferol (VITAMIN D3) 2000 units TABS Take 2,000 Units by mouth daily.  . Coenzyme Q10 (CO Q 10 PO) Take 1 tablet by mouth daily.  . Cyanocobalamin (B-12) 2500 MCG TABS Take 2,500 mcg by mouth daily.   . fish oil-omega-3 fatty acids 1000 MG capsule Take 2 g by mouth daily.    . furosemide  (LASIX) 20 MG tablet Take 1 tablet (20 mg total) by mouth daily. Take extra 20 mg once a day as needed for edema  . glucosamine-chondroitin 500-400 MG tablet Take 1 tablet by mouth 2 (two) times daily.   . Magnesium (CVS MAGNESIUM) 250 MG TABS Take 250 mg by mouth daily.  . methocarbamol (ROBAXIN) 500 MG tablet Take 1 tablet (500 mg total) by mouth every 6 (six) hours as needed for muscle spasms.  . Multiple Vitamin (MULTIVITAMIN) tablet Take 1 tablet by mouth daily.    . nitroGLYCERIN (NITROSTAT) 0.4 MG SL tablet Place 1 tablet (0.4 mg total) under the tongue every 5 (five) minutes as needed for chest pain.  Marland Kitchen oxyCODONE (OXY IR/ROXICODONE) 5 MG immediate release tablet Take 1-2 tablets (5-10 mg total) by mouth every 6 (six) hours as needed for moderate pain (pain score 4-6).  . pindolol (VISKEN) 10 MG tablet Take 1 tablet (10 mg total) by mouth 2 (two) times daily.  . Tamsulosin HCl (FLOMAX) 0.4 MG CAPS Take 0.4 mg by mouth 2 (two) times daily.    . [DISCONTINUED] traMADol (ULTRAM) 50 MG tablet Take 1-2 tablets (50-100 mg total) by mouth every 6 (six) hours as needed for moderate pain (if oxycodone alone not sufficient).     Allergies:   Patient has no known allergies.   Social History   Socioeconomic History  . Marital status: Widowed    Spouse name: Not on file  . Number of children: Not on file  . Years of education: Not on file  . Highest education level: Not on file  Occupational History  . Occupation: retired  Scientific laboratory technician  . Financial resource strain: Not on file  . Food insecurity:    Worry: Not on file    Inability: Not on file  . Transportation needs:    Medical: Not on file    Non-medical: Not on file  Tobacco Use  . Smoking status: Never Smoker  . Smokeless tobacco: Never Used  Substance and Sexual Activity  . Alcohol use: No    Comment: occsaoinal  . Drug use: Yes    Types: Other-see comments  . Sexual activity: Not on file  Lifestyle  . Physical activity:     Days per week: Not on file    Minutes per session: Not on file  . Stress: Not on file  Relationships  . Social connections:    Talks on phone: Not on file    Gets together: Not on file    Attends religious service: Not on file    Active member of club or organization: Not on file    Attends meetings of clubs or organizations: Not on file    Relationship status: Not on file  Other Topics Concern  . Not on file  Social History Narrative   Lives in [place] by himself but 2 dtrs nearby and help out when necessary.     Family History:  The patient's family history includes Coronary artery disease in his unknown relative.  ROS:   Please see the history of present illness.    ROS  All other systenonems reviewed and negative.   EKGs/Labs/Other Studies Reviewed:    The following studies were reviewed today: none  EKG:  EKG is  ordered today.  The ekg ordered today demonstrates EKG shows sinus bradycardia 54 bpm with no ST-T wave abnormalities.  The QTC is stable at 438 ms.  Recent Labs: 10/28/2017: TSH 1.550 02/21/2018: ALT 20 03/04/2018: Hemoglobin 10.2; Platelets 196 03/05/2018: Magnesium 1.9 03/06/2018: BUN 14; Creatinine, Ser 1.00; Potassium 4.0; Sodium 143   Recent Lipid Panel    Component Value Date/Time   CHOL 117 07/02/2017 0818   TRIG 39 07/02/2017 0818   HDL 58 07/02/2017 0818   CHOLHDL 2.0 07/02/2017 0818   CHOLHDL 1.9 06/19/2016 0809   VLDL 7 06/19/2016 0809   LDLCALC 51 07/02/2017 0818    Physical Exam:    VS:  BP 114/70   Pulse (!) 54   Ht 6\' 2"  (1.88 m)   Wt 156 lb 1.9 oz (70.8 kg)   SpO2 96%   BMI 20.04 kg/m     Wt Readings from Last 3 Encounters:  03/16/18 156 lb 1.9 oz (70.8 kg)  02/28/18 152 lb 8 oz (69.2 kg)  02/21/18 152 lb 8 oz (69.2 kg)     GEN:  Well nourished, well developed in no acute distress HEENT: Normal NECK: No JVD; No carotid bruits LYMPHATICS: No lymphadenopathy CARDIAC: RRR, no murmurs, rubs, gallops RESPIRATORY:  Clear to  auscultation without rales, wheezing or rhonchi  ABDOMEN: Soft, non-tender, non-distended MUSCULOSKELETAL:  Left knee swollen; No deformity  SKIN: Warm and dry NEUROLOGIC:  Alert and oriented x 3 PSYCHIATRIC:  Normal affect   ASSESSMENT:    1. Coronary artery disease involving native coronary artery of native heart without angina pectoris   2. Persistent atrial fibrillation ([place])   3. Chronic diastolic CHF (congestive heart failure) ([place])   4. HYPERTENSION, BENIGN   5. Dyslipidemia    PLAN:    In order of problems listed above:  1. ASCAD - s/p BMS to the RCA.  He denies any anginal symptoms.  He will continue on atorvastatin and beta-blocker.  He is not on aspirin due to DOAC.    2.  Persistent atrial fibrillation -he had a recent recurrence of atrial fibrillation of RVR after knee replacement.  He underwent TEE cardioversion to sinus rhythm was loaded on amiodarone.  He is maintaining normal sinus rhythm on exam today.  He will continue on amiodarone 200 mg daily, pindolol 10 mg twice daily and apixaban 5 mg twice daily.  Creatinine was normal at 1 and hemoglobin 10.2 on 03/04/2018.  Check TSH and LFTs in 3 months.   3.  Chronic diastolic CHF -he appears euvolemic on exam today.  He will continue on Lasix 20 mg daily.  Potassium was 4 on 03/06/2018.  4.  HTN -BP is well controlled on exam today.  He will continue on pindolol 10 mg twice daily  5.  Hyperlipidemia with LDL goal < 70.  LDL was 51 on 07/02/2017.  He will continue on atorvastatin 20 mg daily.  6.  Dilated ascending aorta - 35mm by Chest CT angio 10/2017 - repeat chest CT in 04/2018 for stability.   Medication Adjustments/Labs and Tests Ordered: Current medicines are reviewed at length with the patient today.  Concerns regarding medicines are outlined above.  No orders of the defined types were placed in this encounter.  No orders of the defined types were placed in this encounter.   Signed, Fransico Him, MD    03/16/2018 8:40 AM    [place]

## 2018-03-16 NOTE — Patient Instructions (Signed)
Medication Instructions:  Your physician recommends that you continue on your current medications as directed. Please refer to the Current Medication list given to you today.   Labwork: Future: TSH and LFT and BMET in 3 months. BMET is prior to CT angio  Testing/Procedures: Non-Cardiac CT Angiography (CTA), is a special type of CT scan that uses a computer to produce multi-dimensional views of major blood vessels throughout the body. In CT angiography, a contrast material is injected through an IV to help visualize the blood vessels   Follow-Up: Your physician recommends that you schedule a follow-up appointment in: 3 months with Dr. Radford Pax  If you need a refill on your cardiac medications before your next appointment, please call your pharmacy.

## 2018-03-22 ENCOUNTER — Other Ambulatory Visit: Payer: Self-pay | Admitting: *Deleted

## 2018-03-22 MED ORDER — AMIODARONE HCL 200 MG PO TABS: 200.0000 mg | ORAL_TABLET | Freq: Every day | ORAL | 3 refills | Status: DC

## 2018-03-22 NOTE — Telephone Encounter (Signed)
Patients daughter called and requested that Dr Radford Pax refill patients amiodarone. Ordered by Derl Barrow, PA. Okay to refill under Dr Radford Pax? Please advise. Thanks, MI

## 2018-04-12 ENCOUNTER — Telehealth: Payer: Self-pay | Admitting: Cardiology

## 2018-04-12 NOTE — Telephone Encounter (Signed)
New Message:   Patient is calling he is having some concerns about his appts coming up. Please call patient.

## 2018-04-13 NOTE — Telephone Encounter (Signed)
Spoke with the patient, he had his concerns addressed. He had no further questions.

## 2018-04-15 ENCOUNTER — Other Ambulatory Visit: Payer: Medicare Other | Admitting: *Deleted

## 2018-04-15 ENCOUNTER — Encounter (INDEPENDENT_AMBULATORY_CARE_PROVIDER_SITE_OTHER): Payer: Self-pay

## 2018-04-15 DIAGNOSIS — I2510 Atherosclerotic heart disease of native coronary artery without angina pectoris: Secondary | ICD-10-CM

## 2018-04-15 DIAGNOSIS — I4819 Other persistent atrial fibrillation: Secondary | ICD-10-CM

## 2018-04-15 LAB — BASIC METABOLIC PANEL
BUN / CREAT RATIO: 12 (ref 10–24)
BUN: 14 mg/dL (ref 8–27)
CHLORIDE: 103 mmol/L (ref 96–106)
CO2: 25 mmol/L (ref 20–29)
Calcium: 9.3 mg/dL (ref 8.6–10.2)
Creatinine, Ser: 1.15 mg/dL (ref 0.76–1.27)
GFR calc Af Amer: 70 mL/min/{1.73_m2} (ref >59)
GFR calc non Af Amer: 61 mL/min/{1.73_m2} (ref >59)
GLUCOSE: 97 mg/dL (ref 65–99)
Potassium: 4.1 mmol/L (ref 3.5–5.2)
SODIUM: 144 mmol/L (ref 134–144)

## 2018-04-15 LAB — HEPATIC FUNCTION PANEL
ALT: 16 IU/L (ref 0–44)
AST: 17 IU/L (ref 0–40)
Albumin: 4.1 g/dL (ref 3.5–4.8)
Alkaline Phosphatase: 84 IU/L (ref 39–117)
Bilirubin Total: 0.4 mg/dL (ref 0.0–1.2)
Bilirubin, Direct: 0.18 mg/dL (ref 0.00–0.40)
TOTAL PROTEIN: 6.1 g/dL (ref 6.0–8.5)

## 2018-04-15 LAB — TSH: TSH: 2.89 u[IU]/mL (ref 0.450–4.500)

## 2018-04-18 ENCOUNTER — Telehealth: Payer: Self-pay | Admitting: Cardiology

## 2018-04-18 NOTE — Telephone Encounter (Signed)
New Message:     Patient is calling for result for his blood work

## 2018-04-18 NOTE — Telephone Encounter (Signed)
Spoke with the patient regarding his lab work. He expressed understanding and had no further questions.

## 2018-04-20 ENCOUNTER — Ambulatory Visit (INDEPENDENT_AMBULATORY_CARE_PROVIDER_SITE_OTHER)
Admission: RE | Admit: 2018-04-20 | Discharge: 2018-04-20 | Disposition: A | Discharge status: Home / Self Care | Payer: Medicare Other | Source: Ambulatory Visit | Attending: Cardiology | Admitting: Cardiology

## 2018-04-20 ENCOUNTER — Telehealth: Payer: Self-pay

## 2018-04-20 DIAGNOSIS — I77810 Thoracic aortic ectasia: Secondary | ICD-10-CM | POA: Diagnosis not present

## 2018-04-20 MED ORDER — IOPAMIDOL (ISOVUE-370) INJECTION 76%
100.0000 mL | Freq: Once | INTRAVENOUS | Status: AC | PRN
  Administered 2018-04-20: 100 mL via INTRAVENOUS

## 2018-04-20 NOTE — Telephone Encounter (Signed)
-----   Message from Sueanne Margarita, MD sent at 04/20/2018 10:39 AM EDT ----- Chest CTA showed stable aneurysmal dilatation of the ascending thoracic aorta at 27mm.  Repeat Chest CTA in 1 year for followup

## 2018-04-20 NOTE — Telephone Encounter (Signed)
Follow up  ° °Patient is returning call in reference to test results. Please call.  °

## 2018-04-20 NOTE — Telephone Encounter (Signed)
Notes recorded by Frederik Schmidt, RN on 04/20/2018 at 12:02 PM EDT Informed patient of Chest CTA results. He needs to be scheduled for 1 year repeat Chest CT. ------

## 2018-04-20 NOTE — Telephone Encounter (Signed)
Notes recorded by Frederik Schmidt, RN on 04/20/2018 at 11:28 AM EDT [id number] 10/2 ------

## 2018-04-20 NOTE — Telephone Encounter (Signed)
-----   Message from Sueanne Margarita, MD sent at 04/20/2018 10:39 AM EDT ----- Chest CTA showed stable aneurysmal dilatation of the ascending thoracic aorta at 50mm.  Repeat Chest CTA in 1 year for followup

## 2018-04-21 ENCOUNTER — Telehealth: Payer: Self-pay | Admitting: Cardiology

## 2018-04-21 ENCOUNTER — Ambulatory Visit: Payer: Medicare Other | Admitting: Cardiology

## 2018-04-21 MED ORDER — FUROSEMIDE 20 MG PO TABS: 20.0000 mg | ORAL_TABLET | Freq: Every day | ORAL | 3 refills | Status: DC

## 2018-04-21 NOTE — Telephone Encounter (Signed)
Spoke with the patient, he was out of this lasix 20 mg. I refilled and corrected due to wrong pharmacy.

## 2018-04-21 NOTE — Telephone Encounter (Signed)
Placed order for f/u 1 year CTA

## 2018-04-21 NOTE — Telephone Encounter (Signed)
New Message:     Pt wants to know if Dr Radford Pax wants him to continue taking Lasix? If so, he will need it called to [facility]

## 2018-05-23 NOTE — Progress Notes (Signed)
Cardiology Office Note:    Date:  05/24/2018   ID:  [name], DOB 11/11/1940, MRN [id number]  PCP:  Seward Carol, MD  Cardiologist:  Fransico Him, MD    Referring MD: Seward Carol, MD   Chief Complaint  Patient presents with  . Coronary Artery Disease  . Atrial Fibrillation  . Hypertension  . Hyperlipidemia    History of Present Illness:    [name] is a 77 y.o. 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 Diagnosed in April with acute diastolic CHF. Echocardiogram showed low normal LV function with EF 50-55% was felt to possibly be underestimated due to atrial fibrillation. He had mild AR, severe left atrial enlargement and a mild pericardial effusion. Healso was back inatrial fibrillation andwas started onEliquis. 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   He recently was hospitalized after a left total knee replacement which was complicated by recurrent atrial fibrillation with RVR.  He underwent TEE cardioversion to normal sinus rhythm.  He was loaded with amiodarone and has not had any reoccurrence of afib.  He is here today for followup and is doing well.  He denies any chest pain or pressure, SOB, DOE, PND, orthopnea,  dizziness, palpitations or syncope.  He is having some problems with his left knee not having as much range of motion as they would like after total knee replacement and he may need to have a quick procedure done where they put him under anesthesia for a few minutes to move his leg around and break up the scar tissue externally.  He says over the past  week he has noticed bilateral lower extremity edema more on the right than left in his right calf has been very sore to touch.  He has not missed any doses of his anticoagulant.  He is compliant with his meds and is tolerating meds with no SE.    Past Medical History:  Diagnosis Date  . Ascending aorta dilatation (HCC)    52mm by chest CT angio 04/2018  . CAD (coronary artery disease)    a. 05/2006 Abnl stress test w/ 2-67mm ST dep; b. 05/2006 Cath/PCI: LM nl, LAD 30-40ost, 20-30p, 90d (small), D1 nl, D2 nl, LCX nl, OM1/2 nl, RCA Ca2+, 2m/d (3.5x16 [place] BMS & 3.5x12 [place] BMS), EF 50%.  . Chronic diastolic CHF (congestive heart failure) ([place])    a. 10/2017 Echo: EF 50-55%, mild AI, sev dil LA, mod dil RA.  Marland Kitchen Chronic venous insufficiency   . HTN (hypertension)   . PAF (paroxysmal atrial fibrillation) ([place])    a. CHA2DS2VASc 5-->Eliquis; b. Prev WCT on flecainide;  c. 12/2017 s/p DCCV.  Marland Kitchen Pneumothorax   . Ventricular tachycardia (HCC)    a. possibly proarrhythmia from flecainide.    Past Surgical History:  Procedure Laterality Date  . CARDIOVERSION N/A 12/23/2017   Procedure: CARDIOVERSION;  Surgeon: Sueanne Margarita, MD;  Location: [facility] ENDOSCOPY;  Service: Cardiovascular;  Laterality: N/A;  . CARDIOVERSION N/A 03/08/2018   Procedure: CARDIOVERSION;  Surgeon: Sueanne Margarita, MD;  Location: [place];  Service: Cardiovascular;  Laterality: N/A;  .  INGUINAL HERNIA REPAIR     x2  . TEE WITHOUT CARDIOVERSION N/A 03/08/2018   Procedure: TRANSESOPHAGEAL ECHOCARDIOGRAM (TEE);  Surgeon: Sueanne Margarita, MD;  Location: [facility] ENDOSCOPY;  Service: Cardiovascular;  Laterality: N/A;  . TOTAL KNEE ARTHROPLASTY Left 02/28/2018   Procedure: LEFT TOTAL KNEE ARTHROPLASTY;  Surgeon: Gaynelle Arabian, MD;  Location: [facility] ORS;  Service: Orthopedics;  Laterality: Left;    Current Medications: Current Meds  Medication Sig  . amiodarone (PACERONE) 200 MG tablet Take 1 tablet (200 mg total) by mouth daily.  Marland Kitchen  apixaban (ELIQUIS) 5 MG TABS tablet Take 1 tablet (5 mg total) by mouth 2 (two) times daily.  . Ascorbic Acid (VITAMIN C PO) Take 1 tablet by mouth daily.   Marland Kitchen atorvastatin (LIPITOR) 20 MG tablet TAKE ONE TABLET (20 MG) BY MOUTH DAILY.  Marland Kitchen Cholecalciferol (VITAMIN D3) 2000 units TABS Take 2,000 Units by mouth daily.  . Coenzyme Q10 (CO Q 10 PO) Take 1 tablet by mouth daily.  . fish oil-omega-3 fatty acids 1000 MG capsule Take 2 g by mouth daily.    . furosemide (LASIX) 20 MG tablet Take 1 tablet (20 mg total) by mouth daily. Take extra 20 mg once a day as needed for edema  . glucosamine-chondroitin 500-400 MG tablet Take 1 tablet by mouth 2 (two) times daily.   . methocarbamol (ROBAXIN) 500 MG tablet Take 1 tablet (500 mg total) by mouth every 6 (six) hours as needed for muscle spasms.  . Multiple Vitamin (MULTIVITAMIN) tablet Take 1 tablet by mouth daily.    . nitroGLYCERIN (NITROSTAT) 0.4 MG SL tablet Place 1 tablet (0.4 mg total) under the tongue every 5 (five) minutes as needed for chest pain.  Marland Kitchen oxyCODONE (OXY IR/ROXICODONE) 5 MG immediate release tablet Take 1-2 tablets (5-10 mg total) by mouth every 6 (six) hours as needed for moderate pain (pain score 4-6).  . pindolol (VISKEN) 10 MG tablet Take 1 tablet (10 mg total) by mouth 2 (two) times daily.  . Tamsulosin HCl (FLOMAX) 0.4 MG CAPS Take 0.4 mg by mouth 2 (two) times daily.       Allergies:   Patient has no known allergies.   Social History   Socioeconomic History  . Marital status: Widowed    Spouse name: Not on file  . Number of children: Not on file  . Years of education: Not on file  . Highest education level: Not on file  Occupational History  . Occupation: retired  Scientific laboratory technician  . Financial resource strain: Not on file  . Food insecurity:    Worry: Not on file    Inability: Not on file  . Transportation needs:    Medical: Not on file    Non-medical: Not on file  Tobacco Use  . Smoking status: Never Smoker  .  Smokeless tobacco: Never Used  Substance and Sexual Activity  . Alcohol use: No    Comment: occsaoinal  . Drug use: Yes    Types: Other-see comments  . Sexual activity: Not on file  Lifestyle  . Physical activity:    Days per week: Not on file    Minutes per session: Not on file  . Stress: Not on file  Relationships  . Social connections:    Talks on phone: Not on file    Gets together: Not on file    Attends religious service: Not on file    Active member of club or organization: Not on file    Attends  meetings of clubs or organizations: Not on file    Relationship status: Not on file  Other Topics Concern  . Not on file  Social History Narrative   Lives in [place] by himself but 2 dtrs nearby and help out when necessary.     Family History: The patient's family history includes Coronary artery disease in his unknown relative.  ROS:   Please see the history of present illness.    ROS  All other systems reviewed and negative.   EKGs/Labs/Other Studies Reviewed:    The following studies were reviewed today: none  EKG:  EKG is not ordered today.    Recent Labs: 03/04/2018: Hemoglobin 10.2; Platelets 196 03/05/2018: Magnesium 1.9 04/15/2018: ALT 16; BUN 14; Creatinine, Ser 1.15; Potassium 4.1; Sodium 144; TSH 2.890   Recent Lipid Panel    Component Value Date/Time   CHOL 117 07/02/2017 0818   TRIG 39 07/02/2017 0818   HDL 58 07/02/2017 0818   CHOLHDL 2.0 07/02/2017 0818   CHOLHDL 1.9 06/19/2016 0809   VLDL 7 06/19/2016 0809   LDLCALC 51 07/02/2017 0818    Physical Exam:    VS:  BP (!) 124/58 (BP Location: Left Arm, Patient Position: Sitting, Cuff Size: Normal)   Ht 6' 2.4" (1.89 m)   Wt 156 lb 3.2 oz (70.9 kg)   BMI 19.84 kg/m     Wt Readings from Last 3 Encounters:  05/24/18 156 lb 3.2 oz (70.9 kg)  03/16/18 156 lb 1.9 oz (70.8 kg)  02/28/18 152 lb 8 oz (69.2 kg)     GEN:  Well nourished, well developed in no acute distress HEENT: Normal NECK: No JVD;  No carotid bruits LYMPHATICS: No lymphadenopathy CARDIAC: RRR, no murmurs, rubs, gallops RESPIRATORY:  Clear to auscultation without rales, wheezing or rhonchi  ABDOMEN: Soft, non-tender, non-distended MUSCULOSKELETAL: 1+ bilateral lower extremity edema; No deformity  SKIN: Warm and dry NEUROLOGIC:  Alert and oriented x 3 PSYCHIATRIC:  Normal affect   ASSESSMENT:    1. Coronary artery disease involving native coronary artery of native heart without angina pectoris   2. HYPERTENSION, BENIGN   3. Chronic diastolic CHF (congestive heart failure) (HCC)   4. Persistent atrial fibrillation   5. Ascending aorta dilatation (HCC)   6. Dyslipidemia   7. Bilateral lower extremity edema    PLAN:    In order of problems listed above:  1.  ASCAD s/p BMS to the RCA in 2007.  He has not had any anginal symptoms.   He will continue on statin.  He is not on aspirin due to DOAC.  2.  HTN -his BP is well controlled on exam today.    3.  Chronic diastolic CHF -he appears euvolemic on exam today and weight is stable.  He will continue on Lasix 20 mg daily.  Creatinine was stable at 1.15 on 04/15/2018.  4.  Persistent atrial fibrillation - he is status post TEE cardioversion with subsequent amiodarone load following total knee replacement.  He has not had any further breakthrough atrial fibrillation.  He will continue on amiodarone 200 mg and apixaban 5 mg twice daily.  He will also continue on pindolol 10 mg twice daily.  Creatinine was normal at 1.15 and potassium 4.1 on 04/15/2018 and hemoglobin stable at 10.2 on 03/04/2018.  TSH was normal at 2.89 and ALT 16 on 04/15/2018.    We will repeat TSH and ALT in December.  4.  Ascending aortic dilatation -chest CT 04/20/2018 showed stable uncomplicated fusiform  aneurysmal dilatation of the ascending thoracic aorta measuring 44 mm.  This is stable compared to CT 6 months prior.  Repeat chest CT Angio on 1 year.  6.  Hyperlipidemia with LDL goal < 70.  His LDL was  51 on 07/02/2017.   He will continue on atorvastatin 20 mg daily.  I will get an FLP and ALT in 6 weeks when he comes in for blood work for his thyroid and liver testing for amiodarone use.  7.  Bilateral lower extremity edema right greater than left -he has 1+ lower extremity edema clearly worse in the right leg than the left.  He has not missed any doses of anticoagulation but the concerning thing is is that he has had pain in the calf and behind his knee on the right side which is more swollen.  I recommend we check bilateral lower extremity venous Dopplers to rule out DVT despite being on anticoagulation.  I have instructed him to increase his Lasix to 40 mg daily and try to keep his legs elevated above his heart when he sitting down.  He has been fairly sedentary because of his knee replacement and this could be contributing.  Medication Adjustments/Labs and Tests Ordered: Current medicines are reviewed at length with the patient today.  Concerns regarding medicines are outlined above.  No orders of the defined types were placed in this encounter.  No orders of the defined types were placed in this encounter.   Signed, Fransico Him, MD  05/24/2018 1:31 PM    [place]

## 2018-05-24 ENCOUNTER — Ambulatory Visit (INDEPENDENT_AMBULATORY_CARE_PROVIDER_SITE_OTHER): Payer: Medicare Other | Admitting: Cardiology

## 2018-05-24 VITALS — BP 124/58 | Ht 74.4 in | Wt 156.2 lb

## 2018-05-24 DIAGNOSIS — I77810 Thoracic aortic ectasia: Secondary | ICD-10-CM

## 2018-05-24 DIAGNOSIS — I5032 Chronic diastolic (congestive) heart failure: Secondary | ICD-10-CM

## 2018-05-24 DIAGNOSIS — E785 Hyperlipidemia, unspecified: Secondary | ICD-10-CM

## 2018-05-24 DIAGNOSIS — I4819 Other persistent atrial fibrillation: Secondary | ICD-10-CM

## 2018-05-24 DIAGNOSIS — R600 Localized edema: Secondary | ICD-10-CM

## 2018-05-24 DIAGNOSIS — I10 Essential (primary) hypertension: Secondary | ICD-10-CM

## 2018-05-24 DIAGNOSIS — I2510 Atherosclerotic heart disease of native coronary artery without angina pectoris: Secondary | ICD-10-CM

## 2018-05-24 MED ORDER — FUROSEMIDE 40 MG PO TABS: 40.0000 mg | ORAL_TABLET | Freq: Every day | ORAL | 3 refills | Status: DC

## 2018-05-24 NOTE — Patient Instructions (Signed)
Medication Instructions:  Increase Lasix to 40 mg daily  If you need a refill on your cardiac medications before your next appointment, please call your pharmacy.   Lab work: Future: 12/18 BMET, TSH, LFT and Lipids  If you have labs (blood work) drawn today and your tests are completely normal, you will receive your results only by: Marland Kitchen MyChart Message (if you have MyChart) OR . A paper copy in the mail If you have any lab test that is abnormal or we need to change your treatment, we will call you to review the results.  Testing/Procedures: Your physician has requested that you have a lower extremity arterial  duplex. During this test, ultrasound are used to evaluate arterial blood flow in the legs. Allow one hour for this exam. There are no restrictions or special instructions.  Follow-Up: Your physician recommends that you schedule a follow-up appointment in: 2 weeks with PA.  Your physician recommends that you schedule a follow-up appointment in: 3 months with Dr. Radford Pax

## 2018-05-24 NOTE — Addendum Note (Signed)
Addended by: Sarina Ill on: 05/24/2018 02:07 PM   Modules accepted: Orders

## 2018-05-25 ENCOUNTER — Telehealth: Payer: Self-pay | Admitting: Cardiology

## 2018-05-25 ENCOUNTER — Ambulatory Visit (HOSPITAL_COMMUNITY)
Admission: RE | Admit: 2018-05-25 | Discharge: 2018-05-25 | Disposition: A | Discharge status: Home / Self Care | Payer: Medicare Other | Source: Ambulatory Visit | Attending: Cardiovascular Disease | Admitting: Cardiovascular Disease

## 2018-05-25 DIAGNOSIS — R600 Localized edema: Secondary | ICD-10-CM | POA: Diagnosis present

## 2018-05-25 NOTE — Telephone Encounter (Signed)
Patient was asking about venous ultrasound results and advised the patient, when Dr. Radford Pax receives the results we would contact him.

## 2018-05-25 NOTE — Telephone Encounter (Signed)
Follow up  ° ° °Patient returning call from yesterday  °

## 2018-05-27 ENCOUNTER — Telehealth: Payer: Self-pay | Admitting: Cardiology

## 2018-05-27 NOTE — Telephone Encounter (Signed)
Called patient about his vascular study. 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 Asked Dr. Lovena Le, DOD about results and what to do about patient's continues pain. Dr. Lovena Le advised patient to follow up with ortho. Informed patient of Dr. Tanna Furry advisement. Patient stated he will see them today.

## 2018-05-27 NOTE — Telephone Encounter (Signed)
°  Patient calling for results of vascular scan

## 2018-05-30 ENCOUNTER — Telehealth: Payer: Self-pay | Admitting: Cardiology

## 2018-05-30 NOTE — Telephone Encounter (Signed)
Pt wanting to know if needs to keep appt.Marland Kitchen Appears leg pain is from cyst .Will forward to Dr Radford Pax .

## 2018-05-30 NOTE — Telephone Encounter (Signed)
New message:  Patient calling about a appt that is on 06/07/18 wanted to know if he still needs this appt  Now that he is going to the orthopedic. Please call patient.

## 2018-05-30 NOTE — Telephone Encounter (Signed)
Attempted to call the pt back to ask if lasix improved his edema, per Dr Radford Pax, and he did not answer and had no VM set-up.

## 2018-05-30 NOTE — Telephone Encounter (Signed)
Has edema improved with increased lasix dose

## 2018-05-31 NOTE — Telephone Encounter (Signed)
Left the pt a message to call the office back and request to speak with a triage nurse, to endorse Dr. Theodosia Blender recommendations, as indicated in this message.

## 2018-05-31 NOTE — Telephone Encounter (Signed)
Ok to cancel appt with Estella Husk, PA and come for scheduled blood work

## 2018-05-31 NOTE — Telephone Encounter (Signed)
Called the pt per Dr. Theodosia Blender request..  The pt reports that he has had some improvement with his edema since increasing the lasix to 40mg  a day...he says it is not completely gone but much improved.   He says that he has been feeling well and requests to cancel his f/u appt with Estella Husk PA which was made at his last visit with Dr. Radford Pax.Marland Kitchen He says he does not need it and is planning to return for his labwork (bmet) in December as previously scheduled.  I advised him that I will let Dr. Radford Pax know how he feels and let him know her recommendations re: the 11/19 appt... Pt is also set to see Dr. Radford Pax back on 09/14/18.

## 2018-06-02 NOTE — Telephone Encounter (Signed)
LMTCB

## 2018-06-02 NOTE — Telephone Encounter (Signed)
Lm that it is okay to cx Ermalinda Barrios PA  appt ./cy

## 2018-06-07 ENCOUNTER — Ambulatory Visit: Payer: Medicare Other | Admitting: Physician Assistant

## 2018-07-06 ENCOUNTER — Other Ambulatory Visit: Payer: Medicare Other

## 2018-07-06 DIAGNOSIS — E785 Hyperlipidemia, unspecified: Secondary | ICD-10-CM

## 2018-07-06 DIAGNOSIS — I4819 Other persistent atrial fibrillation: Secondary | ICD-10-CM

## 2018-07-06 DIAGNOSIS — I10 Essential (primary) hypertension: Secondary | ICD-10-CM

## 2018-07-06 DIAGNOSIS — I5032 Chronic diastolic (congestive) heart failure: Secondary | ICD-10-CM

## 2018-07-06 DIAGNOSIS — I2510 Atherosclerotic heart disease of native coronary artery without angina pectoris: Secondary | ICD-10-CM

## 2018-07-06 LAB — HEPATIC FUNCTION PANEL
ALT: 20 IU/L (ref 0–44)
AST: 18 IU/L (ref 0–40)
Albumin: 4 g/dL (ref 3.5–4.8)
Alkaline Phosphatase: 76 IU/L (ref 39–117)
Bilirubin Total: 0.2 mg/dL (ref 0.0–1.2)
Bilirubin, Direct: 0.11 mg/dL (ref 0.00–0.40)
Total Protein: 5.8 g/dL — ABNORMAL LOW (ref 6.0–8.5)

## 2018-07-06 LAB — BASIC METABOLIC PANEL
BUN / CREAT RATIO: 13 (ref 10–24)
BUN: 18 mg/dL (ref 8–27)
CO2: 25 mmol/L (ref 20–29)
CREATININE: 1.34 mg/dL — AB (ref 0.76–1.27)
Calcium: 9.1 mg/dL (ref 8.6–10.2)
Chloride: 104 mmol/L (ref 96–106)
GFR calc Af Amer: 58 mL/min/{1.73_m2} — ABNORMAL LOW (ref >59)
GFR, EST NON AFRICAN AMERICAN: 50 mL/min/{1.73_m2} — AB (ref >59)
GLUCOSE: 96 mg/dL (ref 65–99)
Potassium: 4.1 mmol/L (ref 3.5–5.2)
SODIUM: 143 mmol/L (ref 134–144)

## 2018-07-06 LAB — LIPID PANEL
CHOLESTEROL TOTAL: 127 mg/dL (ref 100–199)
Chol/HDL Ratio: 2.2 ratio (ref 0.0–5.0)
HDL: 58 mg/dL (ref >39)
LDL CALC: 61 mg/dL (ref 0–99)
TRIGLYCERIDES: 40 mg/dL (ref 0–149)
VLDL Cholesterol Cal: 8 mg/dL (ref 5–40)

## 2018-07-06 LAB — TSH: TSH: 2.11 u[IU]/mL (ref 0.450–4.500)

## 2018-07-07 ENCOUNTER — Telehealth: Payer: Self-pay

## 2018-07-07 DIAGNOSIS — R7989 Other specified abnormal findings of blood chemistry: Secondary | ICD-10-CM

## 2018-07-07 MED ORDER — FUROSEMIDE 20 MG PO TABS: 20.0000 mg | ORAL_TABLET | Freq: Every day | ORAL | 3 refills | Status: DC

## 2018-07-07 NOTE — Telephone Encounter (Signed)
-----   Message from Sueanne Margarita, MD sent at 07/07/2018  7:55 AM EST ----- Please let patient know that creatinine has increased.  Please decrease lasix to 20mg  daily and repeat BMET in 1 week

## 2018-07-07 NOTE — Telephone Encounter (Signed)
Spoke with the patient, he accepted taking 20 mg of Lasix and is scheduled for 1 week f/u BMET 12/27. He had no further questions.

## 2018-07-15 ENCOUNTER — Other Ambulatory Visit: Payer: Medicare Other | Admitting: *Deleted

## 2018-07-15 ENCOUNTER — Encounter (INDEPENDENT_AMBULATORY_CARE_PROVIDER_SITE_OTHER): Payer: Self-pay

## 2018-07-15 DIAGNOSIS — R7989 Other specified abnormal findings of blood chemistry: Secondary | ICD-10-CM

## 2018-07-15 LAB — BASIC METABOLIC PANEL
BUN/Creatinine Ratio: 11 (ref 10–24)
BUN: 14 mg/dL (ref 8–27)
CHLORIDE: 105 mmol/L (ref 96–106)
CO2: 24 mmol/L (ref 20–29)
Calcium: 9.3 mg/dL (ref 8.6–10.2)
Creatinine, Ser: 1.25 mg/dL (ref 0.76–1.27)
GFR calc non Af Amer: 54 mL/min/{1.73_m2} — ABNORMAL LOW (ref >59)
GFR, EST AFRICAN AMERICAN: 63 mL/min/{1.73_m2} (ref >59)
Glucose: 94 mg/dL (ref 65–99)
Potassium: 4.3 mmol/L (ref 3.5–5.2)
Sodium: 142 mmol/L (ref 134–144)

## 2018-08-09 ENCOUNTER — Telehealth: Payer: Self-pay | Admitting: Cardiology

## 2018-08-09 NOTE — Telephone Encounter (Signed)
Spoke to patient regarding his Pindolol dosage.  He is prescribed 10 mg bid, but has 5 mg tablets from his pharmacy.    I told him ok to take 2 x 5 mg tablets bid.  He will call us when he needs a refill and we'll try to accommodate him with 10 mg tablets.  He verbalized understanding and was thankful for the call.

## 2018-08-09 NOTE — Telephone Encounter (Signed)
New Message        Pt c/o medication issue:  1. Name of Medication: Pindolol  2. How are you currently taking this medication (dosage and times per day)? 2 x a day 10 mg  3. Are you having a reaction (difficulty breathing--STAT)? No   4. What is your medication issue? Need to confirm the dosage

## 2018-08-10 ENCOUNTER — Other Ambulatory Visit: Payer: Self-pay | Admitting: Cardiology

## 2018-08-26 ENCOUNTER — Encounter: Payer: Self-pay | Admitting: Cardiology

## 2018-08-31 ENCOUNTER — Telehealth: Payer: Self-pay | Admitting: Cardiology

## 2018-08-31 NOTE — Telephone Encounter (Signed)
New message:   Patient calling concering when he works out what not to do. Please call back.

## 2018-08-31 NOTE — Telephone Encounter (Signed)
Spoke with the patient, he wanted to know why he is supposed restrict his upper body workouts and what he can do. Sending to Dr. Radford Pax.

## 2018-09-01 NOTE — Telephone Encounter (Signed)
He has a mild to moderate sized ascending aortic aneurysm and therefore should not do any upper body weight lifting including pushups.  The increased stress on aorta could cause it to enlarge further

## 2018-09-05 NOTE — Telephone Encounter (Signed)
Spoke with the patient, he expressed understanding about Dr. Theodosia Blender recommendations. He had no further questions.

## 2018-09-14 ENCOUNTER — Encounter: Payer: Self-pay | Admitting: Cardiology

## 2018-09-14 ENCOUNTER — Ambulatory Visit (INDEPENDENT_AMBULATORY_CARE_PROVIDER_SITE_OTHER): Payer: Medicare Other | Admitting: Cardiology

## 2018-09-14 VITALS — BP 142/78 | HR 44 | Ht 74.0 in | Wt 160.0 lb

## 2018-09-14 DIAGNOSIS — I2510 Atherosclerotic heart disease of native coronary artery without angina pectoris: Secondary | ICD-10-CM

## 2018-09-14 DIAGNOSIS — I77810 Thoracic aortic ectasia: Secondary | ICD-10-CM

## 2018-09-14 DIAGNOSIS — I4819 Other persistent atrial fibrillation: Secondary | ICD-10-CM | POA: Diagnosis not present

## 2018-09-14 DIAGNOSIS — E785 Hyperlipidemia, unspecified: Secondary | ICD-10-CM

## 2018-09-14 DIAGNOSIS — I10 Essential (primary) hypertension: Secondary | ICD-10-CM

## 2018-09-14 DIAGNOSIS — I5032 Chronic diastolic (congestive) heart failure: Secondary | ICD-10-CM | POA: Diagnosis not present

## 2018-09-14 LAB — CBC
HEMOGLOBIN: 11.1 g/dL — AB (ref 13.0–17.7)
Hematocrit: 33.5 % — ABNORMAL LOW (ref 37.5–51.0)
MCH: 28.1 pg (ref 26.6–33.0)
MCHC: 33.1 g/dL (ref 31.5–35.7)
MCV: 85 fL (ref 79–97)
Platelets: 219 10*3/uL (ref 150–450)
RBC: 3.95 x10E6/uL — AB (ref 4.14–5.80)
RDW: 14.6 % (ref 11.6–15.4)
WBC: 5.6 10*3/uL (ref 3.4–10.8)

## 2018-09-14 MED ORDER — AMIODARONE HCL 200 MG PO TABS: 200.0000 mg | ORAL_TABLET | Freq: Every day | ORAL | 3 refills | Status: DC

## 2018-09-14 MED ORDER — PINDOLOL 5 MG PO TABS: 5.0000 mg | ORAL_TABLET | Freq: Two times a day (BID) | ORAL | 3 refills | Status: DC

## 2018-09-14 NOTE — Progress Notes (Signed)
Cardiology Office Note:    Date:  09/14/2018   ID:  [name], Amedee Jul 30, 1940, MRN [id number]  PCP:  Seward Carol, MD  Cardiologist:  Fransico Him, MD    Referring MD: Seward Carol, MD   Chief Complaint  Patient presents with  . Coronary Artery Disease  . Hypertension  . Atrial Fibrillation  . Hyperlipidemia    History of Present Illness:    [name] is a 78 y.o. 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   He is here today for followup and is doing well.  He denies any chest pain or pressure, SOB, DOE, PND, orthopnea, LE edema, dizziness, palpitations or syncope. He is compliant with his meds and is tolerating meds with no SE.    Past Medical History:  Diagnosis Date  . Ascending aorta dilatation (HCC)    78mm by chest CT angio 04/2018  . CAD (coronary artery disease)    a. 05/2006 Abnl stress test w/ 2-29mm ST dep; b. 05/2006 Cath/PCI: LM nl, LAD 30-40ost, 20-30p, 90d (small), D1 nl, D2 nl, LCX nl, OM1/2 nl, RCA Ca2+, 72m/d (3.5x16 Liberty BMS & 3.5x12 Liberty BMS), EF 50%.  . Chronic diastolic CHF (congestive heart failure) ([place])    a. 10/2017 Echo: EF 50-55%, mild AI, sev dil LA, mod dil RA.  Marland Kitchen Chronic venous insufficiency   . HTN (hypertension)   . PAF (paroxysmal atrial fibrillation) ([place])    a. CHA2DS2VASc 5-->Eliquis; b. Prev WCT on flecainide;  c. 12/2017 s/p DCCV.  Marland Kitchen Pneumothorax   . Ventricular tachycardia (HCC)    a. possibly proarrhythmia from flecainide.    Past Surgical History:  Procedure Laterality Date  . CARDIOVERSION N/A 12/23/2017   Procedure: CARDIOVERSION;  Surgeon: Sueanne Margarita, MD;  Location: [facility] ENDOSCOPY;  Service: Cardiovascular;  Laterality: N/A;  . CARDIOVERSION N/A 03/08/2018   Procedure:  CARDIOVERSION;  Surgeon: Sueanne Margarita, MD;  Location: [facility] ENDOSCOPY;  Service: Cardiovascular;  Laterality: N/A;  . INGUINAL HERNIA REPAIR     x2  . TEE WITHOUT CARDIOVERSION N/A 03/08/2018   Procedure: TRANSESOPHAGEAL ECHOCARDIOGRAM (TEE);  Surgeon: Sueanne Margarita, MD;  Location: [facility] ENDOSCOPY;  Service: Cardiovascular;  Laterality: N/A;  . TOTAL KNEE ARTHROPLASTY Left 02/28/2018   Procedure: LEFT TOTAL KNEE ARTHROPLASTY;  Surgeon: Gaynelle Arabian, MD;  Location: [facility] ORS;  Service: Orthopedics;  Laterality: Left;    Current Medications: Current Meds  Medication Sig  . amiodarone (PACERONE) 200 MG tablet Take 1 tablet (200 mg total) by mouth daily.  Marland Kitchen apixaban (ELIQUIS) 5 MG TABS tablet Take 1 tablet (5 mg total) by mouth 2 (two) times daily.  . Ascorbic Acid (VITAMIN C PO) Take 1 tablet by mouth daily.   Marland Kitchen atorvastatin (LIPITOR) 20 MG tablet TAKE 1 TABLET BY MOUTH EVERY DAY  . Cholecalciferol (VITAMIN D3) 2000 units TABS Take 2,000 Units by mouth daily.  . fish oil-omega-3 fatty acids 1000 MG capsule Take 2 g by mouth daily.    . furosemide (LASIX) 20 MG tablet Take 1 tablet (20 mg total) by mouth daily.  Marland Kitchen glucosamine-chondroitin 500-400 MG tablet Take 1 tablet by mouth 2 (two) times daily.   . Multiple Vitamin (MULTIVITAMIN) tablet Take 1 tablet by mouth daily.    . nitroGLYCERIN (  NITROSTAT) 0.4 MG SL tablet Place 1 tablet (0.4 mg total) under the tongue every 5 (five) minutes as needed for chest pain.  . pindolol (VISKEN) 10 MG tablet Take 1 tablet (10 mg total) by mouth 2 (two) times daily.  . Tamsulosin HCl (FLOMAX) 0.4 MG CAPS Take 0.4 mg by mouth 2 (two) times daily.       Allergies:   Patient has no known allergies.   Social History   Socioeconomic History  . Marital status: Widowed    Spouse name: Not on file  . Number of children: Not on file  . Years of education: Not on file  . Highest education level: Not on file  Occupational History  . Occupation: retired  Photographer  . Financial resource strain: Not on file  . Food insecurity:    Worry: Not on file    Inability: Not on file  . Transportation needs:    Medical: Not on file    Non-medical: Not on file  Tobacco Use  . Smoking status: Never Smoker  . Smokeless tobacco: Never Used  Substance and Sexual Activity  . Alcohol use: No    Comment: occsaoinal  . Drug use: Yes    Types: Other-see comments  . Sexual activity: Not on file  Lifestyle  . Physical activity:    Days per week: Not on file    Minutes per session: Not on file  . Stress: Not on file  Relationships  . Social connections:    Talks on phone: Not on file    Gets together: Not on file    Attends religious service: Not on file    Active member of club or organization: Not on file    Attends meetings of clubs or organizations: Not on file    Relationship status: Not on file  Other Topics Concern  . Not on file  Social History Narrative   Lives in [place] by himself but 2 dtrs nearby and help out when necessary.     Family History: The patient's family history includes Coronary artery disease in an other family member.  ROS:   Please see the history of present illness.    ROS  All other systems reviewed and negative.   EKGs/Labs/Other Studies Reviewed:    The following studies were reviewed today: none  EKG:  EKG is not ordered today.    Recent Labs: 03/04/2018: Hemoglobin 10.2; Platelets 196 03/05/2018: Magnesium 1.9 07/06/2018: ALT 20; TSH 2.110 07/15/2018: BUN 14; Creatinine, Ser 1.25; Potassium 4.3; Sodium 142   Recent Lipid Panel    Component Value Date/Time   CHOL 127 07/06/2018 0817   TRIG 40 07/06/2018 0817   HDL 58 07/06/2018 0817   CHOLHDL 2.2 07/06/2018 0817   CHOLHDL 1.9 06/19/2016 0809   VLDL 7 06/19/2016 0809   LDLCALC 61 07/06/2018 0817    Physical Exam:    VS:  BP (!) 142/78   Pulse (!) 44   Ht 6\' 2"  (1.88 m)   Wt 160 lb (72.6 kg)   BMI 20.54 kg/m     Wt Readings from Last 3  Encounters:  09/14/18 160 lb (72.6 kg)  05/24/18 156 lb 3.2 oz (70.9 kg)  03/16/18 156 lb 1.9 oz (70.8 kg)     GEN:  Well nourished, well developed in no acute distress HEENT: Normal NECK: No JVD; No carotid bruits LYMPHATICS: No lymphadenopathy CARDIAC: Regular rhythm and bradycardic, no murmurs, rubs, gallops RESPIRATORY:  Clear to auscultation without rales,  wheezing or rhonchi  ABDOMEN: Soft, non-tender, non-distended MUSCULOSKELETAL:  No edema; No deformity  SKIN: Warm and dry NEUROLOGIC:  Alert and oriented x 3 PSYCHIATRIC:  Normal affect   ASSESSMENT:    1. Coronary artery disease involving native coronary artery of native heart without angina pectoris   2. HYPERTENSION, BENIGN   3. Chronic diastolic CHF (congestive heart failure) (HCC)   4. Persistent atrial fibrillation   5. Ascending aorta dilatation (HCC)   6. Dyslipidemia    PLAN:    In order of problems listed above:  1.   ASCAD - s/p BMS to the RCA.  He has not had any anginal symptoms since I saw him last.  He will continue on beta-blocker and statin therapy.  He is not on aspirin due to apixaban.  2.  HTN -BP is well controlled on exam today but heart rate is in the mid to upper 40s.  I recommended cutting pindolol back to 5 mg twice daily.  He says he is not sure exactly what dose he is on so he will call back and let us know what the dose currently is.  I have asked him to check his blood pressure daily for a week and call with results.  I will have him see my extender back in 1 to 2 weeks for recheck and if blood pressure is trending upward then may need to add on a second agent.  3.  Chronic diastolic CHF -he does not appear volume overloaded on exam today and weight is stable.  He will continue on Lasix 40 mg daily.  His creatinine was stable at 1.25 and potassium 4.3 on 07/15/2018.  4.  Persistent atrial fibrillation -sinus rhythm on exam and has not had any palpitations since I saw him last.  He will  continue on amiodarone 200 mg daily and apixaban 5 mg twice daily.  He is bradycardic today so I recommended cutting pindolol back to 5 mg twice daily.  He will see the extender back in 1 to 2 weeks and if still bradycardic then would stop the pindolol.  Hemoglobin was stable at 10.2 in August 2019.  I will repeat a hemoglobin today.  His TSH was stable at 2.1 on 07/06/2018.  He has not had any baseline PFTs on amio so I will order these today with DLCO.  5.  Ascending aortic dilatation -87 mm on echo 10/21/2017.  Chest CTA 05/08/2018 the ascending aorta measured 44 mm.  Will repeat a chest CT in 1 year to follow-up on this.  Continue statin therapy.  I encouraged him to avoid upper body weight lifting.  6.  Hyperlipidemia -  LDL goal is less than 70.  His LDL was at goal at 61 and ALT normal at 20 on 07/06/2018. He will continue on atorvastatin 20 mg daily.   Medication Adjustments/Labs and Tests Ordered: Current medicines are reviewed at length with the patient today.  Concerns regarding medicines are outlined above.  No orders of the defined types were placed in this encounter.  No orders of the defined types were placed in this encounter.   Signed, Fransico Him, MD  09/14/2018 9:10 AM    [place]

## 2018-09-14 NOTE — Patient Instructions (Signed)
Medication Instructions:  Decrease Pindolol to 5 mg, twice a day, by mouth  If you need a refill on your cardiac medications before your next appointment, please call your pharmacy.   Lab work: Today: CBC  If you have labs (blood work) drawn today and your tests are completely normal, you will receive your results only by: Marland Kitchen MyChart Message (if you have MyChart) OR . A paper copy in the mail If you have any lab test that is abnormal or we need to change your treatment, we will call you to review the results.  Testing/Procedures: Your physician has recommended that you have a pulmonary function test. Pulmonary Function Tests are a group of tests that measure how well air moves in and out of your lungs.  Follow-Up: At [facility], you and your health needs are our priority.  As part of our continuing mission to provide you with exceptional heart care, we have created designated Provider Care Teams.  These Care Teams include your primary Cardiologist (physician) and Advanced Practice Providers (APPs -  Physician Assistants and Nurse Practitioners) who all work together to provide you with the care you need, when you need it.  Your physician recommends that you schedule a follow-up appointment in: 1-2 weeks with the PA.    You will need a follow up appointment in 6 months.  Please call our office 2 months in advance to schedule this appointment.  You may see Fransico Him, MD or one of the following Advanced Practice Providers on your designated Care Team:   [place], PA-C Melina Copa, PA-C . Ermalinda Barrios, PA-C   Check blood pressure daily, for 1 week and call our office with the results.

## 2018-09-14 NOTE — Telephone Encounter (Signed)
  Patient wants to ask some questions regarding why his pindolol (VISKEN) 5 MG tablet was lowered. He states he forgot to ask at his appt today

## 2018-09-14 NOTE — Telephone Encounter (Signed)
Error

## 2018-09-15 ENCOUNTER — Telehealth: Payer: Self-pay | Admitting: Physician Assistant

## 2018-09-15 NOTE — Telephone Encounter (Signed)
The patient has been notified of the result and verbalized understanding.  All questions (if any) were answered. Julaine Hua, [facility] 09/15/2018 12:18 PM

## 2018-09-15 NOTE — Telephone Encounter (Signed)
F/U Message          Patient is returning your call, would like a call back

## 2018-09-16 ENCOUNTER — Telehealth: Payer: Self-pay | Admitting: Cardiology

## 2018-09-16 NOTE — Telephone Encounter (Signed)
Advised the patient, that he should continue to take amiodarone.

## 2018-09-16 NOTE — Telephone Encounter (Signed)
New Message   Pt c/o medication issue:  1. Name of Medication: Amiodarone  2. How are you currently taking this medication (dosage and times per day)? 200mg s  3. Are you having a reaction (difficulty breathing--STAT)? No   4. What is your medication issue? Patient states he has enough for a week and a half but wants to know if the doctor wants to stop the medication or not before he has to refill it.

## 2018-09-21 ENCOUNTER — Telehealth: Payer: Self-pay | Admitting: Cardiology

## 2018-09-21 NOTE — Telephone Encounter (Signed)
  Patient is calling [name] to discuss information that [name] requested.

## 2018-09-21 NOTE — Telephone Encounter (Signed)
Blood pressure: 2/26: 126/70 HR: 53 2/27: 112/66 HR: 53 2/28 110/61 HR: 54 2/29: 135/73 HR:52 3/1: 118/68 HR: 53 3/2 119/62 HR: 52  He has felt fine, he stated that he occasionally gets winded when going up an incline, but he bikes 20 minutes a day and feels fine.

## 2018-09-21 NOTE — Telephone Encounter (Signed)
Good BP no change in treatment at this time

## 2018-09-22 NOTE — Telephone Encounter (Signed)
Spoke with the patient, he expressed understanding and had no further questions.  

## 2018-09-23 ENCOUNTER — Ambulatory Visit (INDEPENDENT_AMBULATORY_CARE_PROVIDER_SITE_OTHER): Payer: Medicare Other | Admitting: Internal Medicine

## 2018-09-23 DIAGNOSIS — R0600 Dyspnea, unspecified: Secondary | ICD-10-CM

## 2018-09-23 DIAGNOSIS — I4819 Other persistent atrial fibrillation: Secondary | ICD-10-CM

## 2018-09-23 LAB — PULMONARY FUNCTION TEST
DL/VA % pred: 110 %
DL/VA: 4.24 ml/min/mmHg/L
DLCO cor % pred: 96 %
DLCO cor: 25.9 ml/min/mmHg
DLCO unc % pred: 85 %
DLCO unc: 22.92 ml/min/mmHg
FEF 25-75 Post: 1.07 L/sec
FEF 25-75 Pre: 0.99 L/sec
FEF2575-%CHANGE-POST: 8 %
FEF2575-%Pred-Post: 46 %
FEF2575-%Pred-Pre: 42 %
FEV1-%Change-Post: 3 %
FEV1-%Pred-Post: 73 %
FEV1-%Pred-Pre: 70 %
FEV1-Post: 2.42 L
FEV1-Pre: 2.33 L
FEV1FVC-%Change-Post: 2 %
FEV1FVC-%Pred-Pre: 81 %
FEV6-%Change-Post: 1 %
FEV6-%Pred-Post: 89 %
FEV6-%Pred-Pre: 88 %
FEV6-Post: 3.86 L
FEV6-Pre: 3.82 L
FEV6FVC-%Change-Post: 0 %
FEV6FVC-%PRED-POST: 101 %
FEV6FVC-%Pred-Pre: 101 %
FVC-%Change-Post: 1 %
FVC-%Pred-Post: 88 %
FVC-%Pred-Pre: 87 %
FVC-POST: 4.06 L
FVC-Pre: 4.01 L
Post FEV1/FVC ratio: 59 %
Post FEV6/FVC ratio: 95 %
Pre FEV1/FVC ratio: 58 %
Pre FEV6/FVC Ratio: 95 %
RV % pred: 112 %
RV: 3.15 L
TLC % pred: 86 %
TLC: 6.65 L

## 2018-09-23 NOTE — Progress Notes (Signed)
Full PFT performed today. Pt was note able to do successful DLCO.

## 2018-09-26 ENCOUNTER — Telehealth: Payer: Self-pay | Admitting: Cardiology

## 2018-09-26 DIAGNOSIS — I4819 Other persistent atrial fibrillation: Secondary | ICD-10-CM

## 2018-09-26 NOTE — Telephone Encounter (Signed)
Spoke with the patient, he accepted canceling his appointment. He also is aware of his PFT results. He had no further questions about either and will follow up with out office in 6 months.    Notes recorded by Sueanne Margarita, MD on 09/25/2018 at 5:07 PM EDT PFTs showed minimal small airway disease but no emphysema - stable lungs for Amio - repeat PFTs in 1 year

## 2018-09-26 NOTE — Telephone Encounter (Signed)
Okay to cancel appointment will need a repeat appointment in 6 months with me

## 2018-09-26 NOTE — Telephone Encounter (Signed)
The visit was originally for monitoring his blood pressure, however, it was normal. Can we cancel his appointment?

## 2018-09-26 NOTE — Telephone Encounter (Signed)
New Message   Pt is calling because he has a Follow up appt with the PA and he wants to know why it is necessary  Please call back

## 2018-09-28 ENCOUNTER — Ambulatory Visit: Payer: Medicare Other | Admitting: Physician Assistant

## 2018-09-29 ENCOUNTER — Encounter: Payer: Self-pay | Admitting: Cardiology

## 2018-10-01 ENCOUNTER — Other Ambulatory Visit: Payer: Self-pay | Admitting: Cardiology

## 2018-10-03 ENCOUNTER — Encounter (HOSPITAL_COMMUNITY): Payer: Self-pay | Admitting: Emergency Medicine

## 2018-10-03 ENCOUNTER — Other Ambulatory Visit: Payer: Self-pay

## 2018-10-03 ENCOUNTER — Emergency Department (HOSPITAL_COMMUNITY)
Admission: EM | Admit: 2018-10-03 | Discharge: 2018-10-03 | Disposition: A | Discharge status: Home / Self Care | Payer: Medicare Other | Attending: Emergency Medicine | Admitting: Emergency Medicine

## 2018-10-03 DIAGNOSIS — Z96652 Presence of left artificial knee joint: Secondary | ICD-10-CM | POA: Diagnosis not present

## 2018-10-03 DIAGNOSIS — I83892 Varicose veins of left lower extremities with other complications: Secondary | ICD-10-CM | POA: Diagnosis not present

## 2018-10-03 DIAGNOSIS — Y92015 Private garage of single-family (private) house as the place of occurrence of the external cause: Secondary | ICD-10-CM | POA: Insufficient documentation

## 2018-10-03 DIAGNOSIS — Y9389 Activity, other specified: Secondary | ICD-10-CM | POA: Diagnosis not present

## 2018-10-03 DIAGNOSIS — Z23 Encounter for immunization: Secondary | ICD-10-CM | POA: Diagnosis not present

## 2018-10-03 DIAGNOSIS — Z79899 Other long term (current) drug therapy: Secondary | ICD-10-CM | POA: Diagnosis not present

## 2018-10-03 DIAGNOSIS — X58XXXA Exposure to other specified factors, initial encounter: Secondary | ICD-10-CM | POA: Insufficient documentation

## 2018-10-03 DIAGNOSIS — I5032 Chronic diastolic (congestive) heart failure: Secondary | ICD-10-CM | POA: Diagnosis not present

## 2018-10-03 DIAGNOSIS — I2510 Atherosclerotic heart disease of native coronary artery without angina pectoris: Secondary | ICD-10-CM | POA: Diagnosis not present

## 2018-10-03 DIAGNOSIS — S91302A Unspecified open wound, left foot, initial encounter: Secondary | ICD-10-CM | POA: Diagnosis present

## 2018-10-03 DIAGNOSIS — Y999 Unspecified external cause status: Secondary | ICD-10-CM | POA: Insufficient documentation

## 2018-10-03 DIAGNOSIS — I110 Hypertensive heart disease with heart failure: Secondary | ICD-10-CM | POA: Diagnosis not present

## 2018-10-03 MED ORDER — TETANUS-DIPHTH-ACELL PERTUSSIS 5-2.5-18.5 LF-MCG/0.5 IM SUSP
0.5000 mL | Freq: Once | INTRAMUSCULAR | Status: AC
  Administered 2018-10-03: 0.5 mL via INTRAMUSCULAR
  Filled 2018-10-03: qty 0.5

## 2018-10-03 MED ORDER — SILVER NITRATE-POT NITRATE 75-25 % EX MISC
1.0000 | Freq: Once | CUTANEOUS | Status: AC
  Administered 2018-10-03: 1 via TOPICAL
  Filled 2018-10-03: qty 1

## 2018-10-03 NOTE — ED Provider Notes (Signed)
[place] DEPT Provider Note   CSN: [id number] Arrival date & time: 10/03/18  1820    History   Chief Complaint No chief complaint on file.   HPI [name] is a 78 y.o. male.     78 year old male brought in by EMS for left foot wound.  Patient states he was in his garage working on his car, was wearing shoes and noticed that his shoe felt squishy.  Patient looked down and noticed that his sock was full of blood and he had blood all over the garage floor.  Patient was unable to identify where the bleeding was coming from, states there was a steady stream of blood coming out of his foot.  Patient states bleeding did improve with applying pressure to this foot and then resume when he lifted his foot up.  Patient called his daughter who is a Animal nutritionist, arrived on scene and was ambulating patient when he began to feel weak.  Patient was found to be hypotensive by EMS, blood pressure has improved.  Last tetanus is unknown.  Bleeding is controlled with pressure dressing.  Patient is on Eliquis.     Past Medical History:  Diagnosis Date  . Ascending aorta dilatation (HCC)    90mm by chest CT angio 04/2018  . CAD (coronary artery disease)    a. 05/2006 Abnl stress test w/ 2-11mm ST dep; b. 05/2006 Cath/PCI: LM nl, LAD 30-40ost, 20-30p, 90d (small), D1 nl, D2 nl, LCX nl, OM1/2 nl, RCA Ca2+, 74m/d (3.5x16 Liberty BMS & 3.5x12 Liberty BMS), EF 50%.  . Chronic diastolic CHF (congestive heart failure) ([place])    a. 10/2017 Echo: EF 50-55%, mild AI, sev dil LA, mod dil RA.  Marland Kitchen Chronic venous insufficiency   . HTN (hypertension)   . PAF (paroxysmal atrial fibrillation) ([place])    a. CHA2DS2VASc 5-->Eliquis; b. Prev WCT on flecainide;  c. 12/2017 s/p DCCV.  Marland Kitchen Pneumothorax   . Ventricular tachycardia (HCC)    a. possibly proarrhythmia from flecainide.    Patient Active Problem List   Diagnosis Date Noted  . Bilateral lower extremity edema 05/24/2018  .  Persistent atrial fibrillation 03/16/2018  . Ascending aorta dilatation (HCC)   . OA (osteoarthritis) of knee 02/28/2018  . Chronic diastolic CHF (congestive heart failure) ([place])   . CAD (coronary artery disease) 06/26/2013  . Ventricular tachycardia ([place])   . Chronic venous insufficiency   . Dyslipidemia 06/08/2011  . 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 HYPERTENSION, BENIGN 05/09/2009    Past Surgical History:  Procedure Laterality Date  . CARDIOVERSION N/A 12/23/2017   Procedure: CARDIOVERSION;  Surgeon: Sueanne Margarita, MD;  Location: [facility] ENDOSCOPY;  Service: Cardiovascular;  Laterality: N/A;  . CARDIOVERSION N/A 03/08/2018   Procedure: CARDIOVERSION;  Surgeon: Sueanne Margarita, MD;  Location: [facility] ENDOSCOPY;  Service: Cardiovascular;  Laterality: N/A;  . INGUINAL HERNIA REPAIR     x2  . TEE WITHOUT CARDIOVERSION N/A 03/08/2018   Procedure: TRANSESOPHAGEAL ECHOCARDIOGRAM (TEE);  Surgeon: Sueanne Margarita, MD;  Location: [facility] ENDOSCOPY;  Service: Cardiovascular;  Laterality: N/A;  . TOTAL KNEE ARTHROPLASTY Left 02/28/2018   Procedure: LEFT TOTAL KNEE ARTHROPLASTY;  Surgeon: Gaynelle Arabian, MD;  Location: [facility] ORS;  Service: Orthopedics;  Laterality: Left;        Home Medications    Prior to Admission medications   Medication Sig Start Date End Date Taking? Authorizing Provider  amiodarone (PACERONE) 200 MG tablet Take 1 tablet (200 mg total) by mouth daily.  09/14/18 09/14/19  Sueanne Margarita, MD  Ascorbic Acid (VITAMIN C PO) Take 1 tablet by mouth daily.     [provider]  atorvastatin (LIPITOR) 20 MG tablet TAKE 1 TABLET BY MOUTH EVERY DAY 08/10/18   Sueanne Margarita, MD  Cholecalciferol (VITAMIN D3) 2000 units TABS Take 2,000 Units by mouth daily.    [provider]  ELIQUIS 5 MG TABS tablet TAKE 1 TABLET BY MOUTH TWICE A DAY 10/03/18   Turner, Eber Hong, MD  fish oil-omega-3 fatty acids 1000 MG capsule Take 2 g by mouth daily.      [provider]  furosemide (LASIX)  20 MG tablet Take 1 tablet (20 mg total) by mouth daily. 07/07/18 07/07/19  Sueanne Margarita, MD  glucosamine-chondroitin 500-400 MG tablet Take 1 tablet by mouth 2 (two) times daily.     [provider]  Multiple Vitamin (MULTIVITAMIN) tablet Take 1 tablet by mouth daily.      [provider]  nitroGLYCERIN (NITROSTAT) 0.4 MG SL tablet Place 1 tablet (0.4 mg total) under the tongue every 5 (five) minutes as needed for chest pain. 10/18/17   Sueanne Margarita, MD  pindolol (VISKEN) 5 MG tablet Take 1 tablet (5 mg total) by mouth 2 (two) times daily. 09/14/18 09/14/19  Sueanne Margarita, MD  Tamsulosin HCl (FLOMAX) 0.4 MG CAPS Take 0.4 mg by mouth 2 (two) times daily.      [provider]    Family History Family History  Problem Relation Age of Onset  . Coronary artery disease Other     Social History Social History   Tobacco Use  . Smoking status: Never Smoker  . Smokeless tobacco: Never Used  Substance Use Topics  . Alcohol use: No    Comment: occsaoinal  . Drug use: Yes    Types: Other-see comments     Allergies   Patient has no known allergies.   Review of Systems Review of Systems  Constitutional: Negative for fever.  Musculoskeletal: Negative for arthralgias and myalgias.  Skin: Positive for wound.  Allergic/Immunologic: Negative for immunocompromised state.  Neurological: Positive for dizziness.  Hematological: Bruises/bleeds easily.     Physical Exam Updated Vital Signs BP 123/62   Pulse (!) 50 Comment: Provider aware  Temp 98.2 F (36.8 C) (Oral)   Resp 18   Ht 6\' 2"  (1.88 m)   Wt 70.8 kg   SpO2 99%   BMI 20.03 kg/m   Physical Exam Vitals signs and nursing note reviewed.  Constitutional:      General: He is not in acute distress.    Appearance: He is well-developed. He is not diaphoretic.  HENT:     Head: Normocephalic and atraumatic.  Cardiovascular:     Pulses: Normal pulses.  Pulmonary:     Effort: Pulmonary effort is  normal.  Musculoskeletal:        General: No swelling, tenderness or deformity.     Left foot: Normal.       Feet:  Skin:    General: Skin is warm and dry.     Capillary Refill: Capillary refill takes less than 2 seconds.     Coloration: Skin is not pale.     Findings: No erythema or rash.  Neurological:     Mental Status: He is alert and oriented to person, place, and time.  Psychiatric:        Behavior: Behavior normal.      ED Treatments / Results  Labs (all labs ordered are listed, but only abnormal results are displayed) Labs Reviewed - No data to display  EKG None  Radiology No results found.  Procedures Procedures (including critical care time)  Medications Ordered in ED Medications  Tdap (BOOSTRIX) injection 0.5 mL (0.5 mLs Intramuscular Given 10/03/18 2000)  silver nitrate applicators applicator 1 Stick (1 Stick Topical Given 10/03/18 2012)     Initial Impression / Assessment and Plan / ED Course  I have reviewed the triage vital signs and the nursing notes.  Pertinent labs & imaging results that were available during my care of the patient were reviewed by me and considered in my medical decision making (see chart for details).  Clinical Course as of Oct 03 2203  Mon Oct 03, 5330  [id number] 78 year old male on Eliquis presents with complaint of bleeding from his left foot.  On exam he appears to have a scab over an area of varicose veins, suspect that he had bleeding from a varicose vein earlier today which has now stopped.  Patient requests silver nitrate to area to cauterize small area which was done prior to discharge, tetanus was updated.  Patient feels well with normal vital signs, suspect that his hypotensive episode with EMS was due to seeing all the blood from his event tonight.   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

## 2018-10-03 NOTE — Discharge Instructions (Signed)
If bleeding returns, apply pressure dressing for 20 minutes. If area continues to bleed, apply another dressing and return to the ER or urgent care.

## 2018-10-03 NOTE — ED Triage Notes (Signed)
Per EMS: Pt from home.  Pt was working in garage, on his car.  Pt's states his L foot felt "squishy".  Pt then noticed his foot was bleeding.  Pt stated he began bleeding at 16:30 and the bleeding stopped at 16:50.  Pt wrapped it at 17:00.  Pt is on Eliquis.  Pt felt lightheaded when daughter help him to a standing position.  Initial BP 80/40, HR 56, CBG 126.  BP increased to 116/51, HR 50.  No pt c/o of pain.

## 2018-10-10 ENCOUNTER — Telehealth: Payer: Self-pay | Admitting: Cardiology

## 2018-10-10 NOTE — Telephone Encounter (Signed)
The patient wanted to know if a cinnamon supplement was fine to take. He also has noticed an increase in his SOB, while outside. I advised that allergies are prevalent now. She stated it usually only occurs outside. He had no other concerns.

## 2018-10-10 NOTE — Telephone Encounter (Signed)
° ° °  Patient calling, would like to know if a cinnamon supplement would interfere with medications. Also has questions about the side effects of pindolol (VISKEN) 5 MG tablet  Pt c/o medication issue:  1. Name of Medication: pindolol (VISKEN) 5 MG tablet  2. How are you currently taking this medication (dosage and times per day)? As written  3. Are you having a reaction (difficulty breathing--STAT)? no  4. What is your medication issue? Can medication cause SOB

## 2018-10-11 NOTE — Telephone Encounter (Signed)
Ok to take cinnamon.  Recommend claritin for allergies and if SOB does not improve then he needs to call us

## 2018-10-12 NOTE — Telephone Encounter (Signed)
Spoke with the patient, he expressed understanding about Dr. Theodosia Blender recommendations.

## 2018-11-21 ENCOUNTER — Telehealth: Payer: Self-pay | Admitting: Cardiology

## 2018-11-21 DIAGNOSIS — R0602 Shortness of breath: Secondary | ICD-10-CM

## 2018-11-21 DIAGNOSIS — I2510 Atherosclerotic heart disease of native coronary artery without angina pectoris: Secondary | ICD-10-CM

## 2018-11-21 DIAGNOSIS — I4819 Other persistent atrial fibrillation: Secondary | ICD-10-CM

## 2018-11-21 DIAGNOSIS — R0609 Other forms of dyspnea: Principal | ICD-10-CM

## 2018-11-21 NOTE — Telephone Encounter (Signed)
Called patient and reviewed Dr. Theodosia Blender advice with him. Patient states his main concern is to know if he can be on the lower dose of Eliquis. I advised him that Eliquis dose is based on age, weight, and kidney function and that at this time per Dr. Radford Pax he needs to remain on Eliqius 5 mg BID. He is concerned about bleeding since he is active and I advised that we do not advise patients to stop Eliquis unless they have internal bleeding or an injury that requires Eliquis to be held for healing. He states he is not taking ASA or ibuprofen. He has Vit E in his multivitamin.  Patient agrees to come to the office for lexiscan myoview and lab work. I advised I will send message to scheduler for date for [place] and that we will do the lab work on the same day unless that date is postponed at which time we will schedule an EKG. Patient states his symptoms occur only recently and with increased exertion. He states it may be related to allergies. I advised that we will call back regarding planning of tests for him and he thanked me for the call.

## 2018-11-21 NOTE — Telephone Encounter (Signed)
Left message for patient to call back for advice from Dr. Radford Pax

## 2018-11-21 NOTE — Telephone Encounter (Signed)
New Message ° °Patient returning your call please call back. °

## 2018-11-21 NOTE — Telephone Encounter (Signed)
Follow Up:    Pt said he had talked  To you earlier today, says he needs to talk  To you again please.

## 2018-11-21 NOTE — Telephone Encounter (Signed)
He needs to stay on current dose of Eliquis.  This is likely not causing his SOB.  Please set up lexiscan myoview to rule out ischemia and come in for CBC, CMET, TSH and BNP.  He may be back in afib and that is why he is SOB.  Please get an EKG when he comes in for his blood work

## 2018-11-21 NOTE — Telephone Encounter (Signed)
Spoke with patient and answered his questions about the lexiscan myoview. I advised that our office will contact him once the test is scheduled. He thanked me for the call.

## 2018-11-21 NOTE — Telephone Encounter (Signed)
Pt concerned the Eliquis is making his blood too "thin".. he says he cut his leg in his garage 10/03/18 and went to the ER because he could not get it to stop bleeding and his sock was soaked with blood.. he went to the ER and got a pressure dressing but he felt very weak and says his BP had dropped.   He says he hit his elbow a week later and same circumstance he bleed for quite a while and had to apply pressure for quite some time. No other noticeable bruising on his body as far as he knows.   He is asking to decrease his dose.. I advised him I will have to talk with Dr. Radford Pax and have her review his chart... his last CBC was 09/14/18 and HGB was 11.1  Pt also says he is getting short of breath when working in the yard such as mowing the lawn but can walk and golf without difficulty.Marland Kitchen it is only with extra exertion.. no edema..he also reports he read Eliquis could be the problem.   Pt BP has been running 110/58.   Will forward to Dr. Radford Pax for her review.

## 2018-11-21 NOTE — Telephone Encounter (Signed)
New message:     Patient calling stating he is having some side effects from [place]. Please call patient.

## 2018-11-22 NOTE — Telephone Encounter (Signed)
° ° °  Patient requesting to speak with me before scheduling stress test

## 2018-11-23 NOTE — Telephone Encounter (Signed)
Patient would like to speak with nurse before he does his stress test. Please call.

## 2018-11-24 NOTE — Telephone Encounter (Signed)
Spoke with the patient, he expressed understanding about the instructions for his [place].

## 2018-11-24 NOTE — Addendum Note (Signed)
Addended by: Sarina Ill on: 11/24/2018 05:11 PM   Modules accepted: Orders

## 2018-11-24 NOTE — Telephone Encounter (Signed)
Left message to call back  

## 2018-12-08 ENCOUNTER — Encounter: Payer: Self-pay | Admitting: Cardiology

## 2019-01-05 ENCOUNTER — Telehealth (HOSPITAL_COMMUNITY): Payer: Self-pay

## 2019-01-05 NOTE — Telephone Encounter (Signed)
New message ° ° °Patient is returning your call. Please call. °

## 2019-01-05 NOTE — Telephone Encounter (Signed)
New message   Just an FYI. We have made several attempts to contact this patient including sending a letter to schedule or reschedule their Myocardial Perfusion We will be removing the patient from the WQ.     6.3. @ 12:31pm home phone just rings [name] 5.21.20 mail reminder letter [name] 5.11.20@ 10:07am lm on home vm to call [place]

## 2019-01-05 NOTE — Telephone Encounter (Signed)
Left message to call back  

## 2019-01-05 NOTE — Telephone Encounter (Signed)
Please call his daughter to make sure he is ok

## 2019-01-09 NOTE — Telephone Encounter (Signed)
Spoke with the patient, he stated he was feeling good and did not want to proceed with the stress test. His symptoms of SOB have decrease. I advised to contact the office if something changes.

## 2019-01-09 NOTE — Telephone Encounter (Signed)
Spoke with the patient, he is doing better and will wait on the stress test. See other encounter.

## 2019-01-11 ENCOUNTER — Encounter (HOSPITAL_COMMUNITY): Payer: Medicare Other

## 2019-01-26 ENCOUNTER — Telehealth: Payer: Self-pay | Admitting: Cardiology

## 2019-01-26 NOTE — Telephone Encounter (Signed)
No message needed °

## 2019-02-01 ENCOUNTER — Encounter (HOSPITAL_COMMUNITY): Payer: Self-pay

## 2019-02-01 ENCOUNTER — Other Ambulatory Visit: Payer: Self-pay

## 2019-02-01 ENCOUNTER — Emergency Department (HOSPITAL_COMMUNITY)
Admission: EM | Admit: 2019-02-01 | Discharge: 2019-02-01 | Disposition: A | Discharge status: Home / Self Care | Payer: Medicare Other | Attending: Emergency Medicine | Admitting: Emergency Medicine

## 2019-02-01 DIAGNOSIS — I110 Hypertensive heart disease with heart failure: Secondary | ICD-10-CM | POA: Diagnosis not present

## 2019-02-01 DIAGNOSIS — Z79899 Other long term (current) drug therapy: Secondary | ICD-10-CM | POA: Insufficient documentation

## 2019-02-01 DIAGNOSIS — I83892 Varicose veins of left lower extremities with other complications: Secondary | ICD-10-CM | POA: Diagnosis not present

## 2019-02-01 DIAGNOSIS — I5032 Chronic diastolic (congestive) heart failure: Secondary | ICD-10-CM | POA: Insufficient documentation

## 2019-02-01 DIAGNOSIS — Z96652 Presence of left artificial knee joint: Secondary | ICD-10-CM | POA: Diagnosis not present

## 2019-02-01 DIAGNOSIS — Z7901 Long term (current) use of anticoagulants: Secondary | ICD-10-CM | POA: Insufficient documentation

## 2019-02-01 DIAGNOSIS — I259 Chronic ischemic heart disease, unspecified: Secondary | ICD-10-CM | POA: Insufficient documentation

## 2019-02-01 DIAGNOSIS — R58 Hemorrhage, not elsewhere classified: Secondary | ICD-10-CM | POA: Diagnosis present

## 2019-02-01 MED ORDER — LIDOCAINE-EPINEPHRINE 2 %-1:100000 IJ SOLN
20.0000 mL | Freq: Once | INTRAMUSCULAR | Status: AC
  Administered 2019-02-01: 3 mL via INTRADERMAL
  Filled 2019-02-01: qty 1

## 2019-02-01 NOTE — ED Triage Notes (Signed)
Patient arrived via [facility] from home. Patient is AOx4 and ambulatory. Patient called 911 due to having squirting blood coming from left foot. Patient is on blood thinners. Patient is unsure of any other medical diagnosis or health conditions at this time.

## 2019-02-01 NOTE — ED Provider Notes (Signed)
[place] DEPT Provider Note   CSN: [id number] Arrival date & time: 02/01/19  0945     History   Chief Complaint Chief Complaint  Patient presents with  . Left Lower Leg Bleed    HPI [name] is a 78 y.o. male.     The history is provided by the patient and medical records. No language interpreter was used.     78 year old male with history of CAD, CHF, paroxysmal atrial fibrillation currently on Eliquis, and hypertension, brought here via EMS for evaluation of bleeding from the leg.  Patient report this morning he was laughing, brushing his teeth when he noticed that blood stabbing in his left lower pants.  He then inspect his leg and notice blood oozing from his leg.  EMS arrived and notice blood squirting from his LLE when he stood up.  Pressure dressing applied and pt brought here.  Pt denies any recent injury, denies pain or numbness.  He Denies fever. He has been compliant with his medication.  He has had one similar bleeding from varicose vein.    Past Medical History:  Diagnosis Date  . Ascending aorta dilatation (HCC)    88mm by chest CT angio 04/2018  . CAD (coronary artery disease)    a. 05/2006 Abnl stress test w/ 2-31mm ST dep; b. 05/2006 Cath/PCI: LM nl, LAD 30-40ost, 20-30p, 90d (small), D1 nl, D2 nl, LCX nl, OM1/2 nl, RCA Ca2+, 27m/d (3.5x16 Liberty BMS & 3.5x12 Liberty BMS), EF 50%.  . Chronic diastolic CHF (congestive heart failure) ([place])    a. 10/2017 Echo: EF 50-55%, mild AI, sev dil LA, mod dil RA.  Marland Kitchen Chronic venous insufficiency   . HTN (hypertension)   . PAF (paroxysmal atrial fibrillation) ([place])    a. CHA2DS2VASc 5-->Eliquis; b. Prev WCT on flecainide;  c. 12/2017 s/p DCCV.  Marland Kitchen Pneumothorax   . Ventricular tachycardia (HCC)    a. possibly proarrhythmia from flecainide.    Patient Active Problem List   Diagnosis Date Noted  . Bilateral lower extremity edema 05/24/2018  . Persistent atrial fibrillation 03/16/2018   . Ascending aorta dilatation (HCC)   . OA (osteoarthritis) of knee 02/28/2018  . Chronic diastolic CHF (congestive heart failure) ([place])   . CAD (coronary artery disease) 06/26/2013  . Ventricular tachycardia ([place])   . Chronic venous insufficiency   . Dyslipidemia 06/08/2011  . 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 HYPERTENSION, BENIGN 05/09/2009    Past Surgical History:  Procedure Laterality Date  . CARDIOVERSION N/A 12/23/2017   Procedure: CARDIOVERSION;  Surgeon: Sueanne Margarita, MD;  Location: [facility] ENDOSCOPY;  Service: Cardiovascular;  Laterality: N/A;  . CARDIOVERSION N/A 03/08/2018   Procedure: CARDIOVERSION;  Surgeon: Sueanne Margarita, MD;  Location: [facility] ENDOSCOPY;  Service: Cardiovascular;  Laterality: N/A;  . INGUINAL HERNIA REPAIR     x2  . TEE WITHOUT CARDIOVERSION N/A 03/08/2018   Procedure: TRANSESOPHAGEAL ECHOCARDIOGRAM (TEE);  Surgeon: Sueanne Margarita, MD;  Location: [facility] ENDOSCOPY;  Service: Cardiovascular;  Laterality: N/A;  . TOTAL KNEE ARTHROPLASTY Left 02/28/2018   Procedure: LEFT TOTAL KNEE ARTHROPLASTY;  Surgeon: Gaynelle Arabian, MD;  Location: [facility] ORS;  Service: Orthopedics;  Laterality: Left;        Home Medications    Prior to Admission medications   Medication Sig Start Date End Date Taking? Authorizing Provider  amiodarone (PACERONE) 200 MG tablet Take 1 tablet (200 mg total) by mouth daily. 09/14/18 09/14/19  Sueanne Margarita, MD  Ascorbic Acid (VITAMIN  C PO) Take 1 tablet by mouth daily.     [provider]  atorvastatin (LIPITOR) 20 MG tablet TAKE 1 TABLET BY MOUTH EVERY DAY 08/10/18   Sueanne Margarita, MD  Cholecalciferol (VITAMIN D3) 2000 units TABS Take 2,000 Units by mouth daily.    [provider]  ELIQUIS 5 MG TABS tablet TAKE 1 TABLET BY MOUTH TWICE A DAY 10/03/18   Turner, Eber Hong, MD  fish oil-omega-3 fatty acids 1000 MG capsule Take 2 g by mouth daily.      [provider]  furosemide (LASIX) 20 MG tablet Take 1 tablet (20 mg total) by  mouth daily. 07/07/18 07/07/19  Sueanne Margarita, MD  glucosamine-chondroitin 500-400 MG tablet Take 1 tablet by mouth 2 (two) times daily.     [provider]  Multiple Vitamin (MULTIVITAMIN) tablet Take 1 tablet by mouth daily.      [provider]  nitroGLYCERIN (NITROSTAT) 0.4 MG SL tablet Place 1 tablet (0.4 mg total) under the tongue every 5 (five) minutes as needed for chest pain. 10/18/17   Sueanne Margarita, MD  pindolol (VISKEN) 5 MG tablet Take 1 tablet (5 mg total) by mouth 2 (two) times daily. 09/14/18 09/14/19  Sueanne Margarita, MD  Tamsulosin HCl (FLOMAX) 0.4 MG CAPS Take 0.4 mg by mouth 2 (two) times daily.      [provider]    Family History Family History  Problem Relation Age of Onset  . Coronary artery disease Other     Social History Social History   Tobacco Use  . Smoking status: Never Smoker  . Smokeless tobacco: Never Used  Substance Use Topics  . Alcohol use: No    Comment: occsaoinal  . Drug use: Yes    Types: Other-see comments     Allergies   Patient has no known allergies.   Review of Systems Review of Systems  Constitutional: Negative for fever.  Skin: Positive for wound.  Neurological: Negative for numbness.     Physical Exam Updated Vital Signs BP 119/66 (BP Location: Right Arm)   Pulse (!) 50   Temp 98.7 F (37.1 C) (Oral)   Resp (!) 22   Ht 6\' 2"  (1.88 m)   Wt 70.8 kg   SpO2 100%   BMI 20.04 kg/m   Physical Exam Vitals signs and nursing note reviewed.  Constitutional:      General: He is not in acute distress.    Appearance: He is well-developed.  HENT:     Head: Atraumatic.  Eyes:     Conjunctiva/sclera: Conjunctivae normal.  Neck:     Musculoskeletal: Neck supple.  Skin:    Findings: No rash.     Comments: LLE: an actively bleeding varicose vein noted to distal lateral lower leg without surrounding trauma.  Pedal pulse palpable with bedside doppler US.  Brisk cap refills to toes.     Neurological:     Mental Status: He is alert.      ED Treatments / Results  Labs (all labs ordered are listed, but only abnormal results are displayed) Labs Reviewed - No data to display  EKG None  Radiology No results found.  Procedures .Marland KitchenLaceration Repair  Date/Time: 02/01/2019 10:23 AM Performed by: Domenic Moras, PA-C Authorized by: Domenic Moras, PA-C   Consent:    Consent obtained:  Verbal   Consent given by:  Patient   Risks discussed:  Vascular damage   Alternatives discussed:  No treatment Anesthesia (see  MAR for exact dosages):    Anesthesia method:  Local infiltration   Local anesthetic:  Lidocaine 1% WITH epi Laceration details:    Location:  Leg   Leg location:  L lower leg   Length (cm):  0.1 Repair type:    Repair type:  Simple Exploration:    Hemostasis achieved with:  Tied off vessels   Contaminated: no   Treatment:    Area cleansed with:  Saline   Amount of cleaning:  Standard   Visualized foreign bodies/material removed: no   Skin repair:    Repair method:  Sutures   Suture size:  5-0   Suture material:  Plain gut   Suture technique:  Figure eight   Number of sutures:  1 Approximation:    Approximation:  Close Post-procedure details:    Dressing: pressure dressing with combat gauze.   Patient tolerance of procedure:  Tolerated well, no immediate complications   (including critical care time)  Medications Ordered in ED Medications  lidocaine-EPINEPHrine (XYLOCAINE W/EPI) 2 %-1:100000 (with pres) injection 20 mL (3 mLs Intradermal Given 02/01/19 1002)     Initial Impression / Assessment and Plan / ED Course  I have reviewed the triage vital signs and the nursing notes.  Pertinent labs & imaging results that were available during my care of the patient were reviewed by me and considered in my medical decision making (see chart for details).        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  Pressure dressing applied.  Wound care instructed. Care discussed with Dr. Ronnald Nian.  12:16 PM Patient has been monitored for the past 2 hours, ambulate without any difficulty, no active bleeding.  Stable for discharge.    Domenic Moras, PA-C 02/01/19 1216    Curatolo, Lares, DO 02/01/19 1510    Curatolo, Adam, DO 02/01/19 1510

## 2019-02-01 NOTE — ED Provider Notes (Signed)
Medical screening examination/treatment/procedure(s) were conducted as a shared visit with non-physician practitioner(s) and myself.  I personally evaluated the patient during the encounter. Briefly, the patient is a 78 y.o. male  Patient brought in by EMS for concern for arterial bleeding to his left lower extremity.  However patient has history of varicose veins.  He is on Eliquis.  When wrapping was taken down patient had slow ooze from likely varicose vein bleed.  Patient had good pulses.  Asymptomatic.  Doubt significant anemia.  PA with suture repair and patient given wound care instructions and discharged from ED in good condition.  This chart was dictated using voice recognition software.  Despite best efforts to proofread,  errors can occur which can change the documentation meaning.     Lennice Sites, DO 02/01/19 1009

## 2019-02-03 ENCOUNTER — Telehealth: Payer: Self-pay | Admitting: Internal Medicine

## 2019-02-03 DIAGNOSIS — I872 Venous insufficiency (chronic) (peripheral): Secondary | ICD-10-CM

## 2019-02-03 DIAGNOSIS — I8392 Asymptomatic varicose veins of left lower extremity: Secondary | ICD-10-CM

## 2019-02-03 NOTE — Telephone Encounter (Signed)
New message:     Patient calling concering some medication issue and states he has been to the ER a few times. Please call patient. Patient also states that he need a medical clearance for dental surgery . Dr. Benson Norway [phone].

## 2019-02-03 NOTE — Telephone Encounter (Signed)
OK to refer to vein and vascular.  Send to preop pool for dental clearance

## 2019-02-03 NOTE — Telephone Encounter (Signed)
Left a message to call back.

## 2019-02-03 NOTE — Telephone Encounter (Signed)
Spoke with the patient, he stated he has had two varicose veins burst and bleed everywhere in the past few months so he had to go to the hospital. He wanted to be referred to vein and vascular.

## 2019-02-06 NOTE — Telephone Encounter (Signed)
F/U Message          Patient is returning a nurses call. Pls advise

## 2019-02-06 NOTE — Telephone Encounter (Signed)
I spoke with pt and gave him information regarding referral.  I also told him I had spoken with dental office and told him they were going to fax clearance if needed.

## 2019-02-06 NOTE — Telephone Encounter (Signed)
Referral to [facility] placed. I spoke with dental office and they are seeing pt today for consultation. They will fax clearance request to our office if needed. Per DPR OK to leave voice message on phone. Left message for pt that referral had been placed and dental office contacted.

## 2019-02-06 NOTE — Telephone Encounter (Signed)
See phone note from 02/03/19

## 2019-02-07 ENCOUNTER — Telehealth: Payer: Self-pay | Admitting: *Deleted

## 2019-02-07 ENCOUNTER — Other Ambulatory Visit: Payer: Self-pay | Admitting: *Deleted

## 2019-02-07 DIAGNOSIS — I83812 Varicose veins of left lower extremities with pain: Secondary | ICD-10-CM

## 2019-02-07 NOTE — Telephone Encounter (Signed)
Returning [name]'s earlier telephone voice message regarding bleeding episode left ankle.  Mr. Roycroft states after showering on 02-01-2019 a varicosity on his left ankle started bleeding and he called EMS and was seen at [facility] ER.  He states no further bleeding from left ankle varicosity but this is his second episode, the first episode was in March 2020.  Reviewed with Mr. Lubeck how to get bleeding stopped if it reoccurs.  Received referral from Mr. Goris's cardiologist.  Informed Mr. Caspers that [facility] schedulers would call him to schedule venous reflux and consult with MD.

## 2019-02-16 ENCOUNTER — Telehealth: Payer: Self-pay | Admitting: Cardiology

## 2019-02-16 NOTE — Telephone Encounter (Signed)
New Message   Pt c/o medication issue:  1. Name of Medication: Boswellia Serrata 1200mg   2. How are you currently taking this medication (dosage and times per day)? 1 tablet a day  3. Are you having a reaction (difficulty breathing--STAT)? no  4. What is your medication issue? Patient wants to make sure the medication won't interfere with Eliquis and Pindolol patient already takes.

## 2019-02-17 NOTE — Telephone Encounter (Signed)
Left message to call back  

## 2019-02-17 NOTE — Telephone Encounter (Signed)
Spoke with pt and went over information provided.  Pt appreciative for call.

## 2019-02-17 NOTE — Telephone Encounter (Signed)
Supplement does not interact with his cardiac meds. Boswellia can cause nausea, diarrhea, and stomach pain - would advise pt to stop therapy if he notices any of these side effects.

## 2019-02-17 NOTE — Telephone Encounter (Signed)
Please let patient know the recommendations made by [place] PharmD

## 2019-02-23 ENCOUNTER — Encounter: Payer: Self-pay | Admitting: Vascular Surgery

## 2019-02-23 ENCOUNTER — Ambulatory Visit (HOSPITAL_COMMUNITY)
Admission: RE | Admit: 2019-02-23 | Discharge: 2019-02-23 | Disposition: A | Discharge status: Home / Self Care | Payer: Medicare Other | Source: Ambulatory Visit | Attending: Vascular Surgery | Admitting: Vascular Surgery

## 2019-02-23 ENCOUNTER — Ambulatory Visit (INDEPENDENT_AMBULATORY_CARE_PROVIDER_SITE_OTHER): Payer: Medicare Other | Admitting: Vascular Surgery

## 2019-02-23 ENCOUNTER — Other Ambulatory Visit: Payer: Self-pay

## 2019-02-23 VITALS — BP 144/73 | HR 53 | Temp 97.7°F | Resp 16 | Ht 74.4 in | Wt 157.2 lb

## 2019-02-23 DIAGNOSIS — I872 Venous insufficiency (chronic) (peripheral): Secondary | ICD-10-CM

## 2019-02-23 DIAGNOSIS — I83812 Varicose veins of left lower extremities with pain: Secondary | ICD-10-CM

## 2019-02-23 DIAGNOSIS — I2510 Atherosclerotic heart disease of native coronary artery without angina pectoris: Secondary | ICD-10-CM

## 2019-02-23 NOTE — Progress Notes (Signed)
REASON FOR CONSULT:    Visit varicose veins of the left lower extremity.  The patient is self-referred.  ASSESSMENT & PLAN:   CHRONIC VENOUS INSUFFICIENCY: This patient has CEAP C4c venous disease.  Based on his duplex he does have evidence of significant deep venous reflux.  He does have some superficial venous reflux also but this is not as significant with no real options for addressing this.  The veins are quite small.  We have discussed conservative treatment for his venous insufficiency.  I discussed with him the importance of intermittent leg elevation and the proper positioning for this.  I have written him a prescription for knee-high compression stockings with a gradient of 15 to 20 mmHg.  I was concerned that giving him a tighter stocking might put him at risk for causing bleeding again from these superficial varicosities on his foot.  I have encouraged him to avoid prolonged sitting and standing.  We discussed the importance of exercise specifically walking and water aerobics.  In addition I encouraged him to keep his skin well lubricated.  I explained that if he does develop recurrent episodes of bleeding I think he would be a good candidate for sclerotherapy of the varicosities on the left foot.  He will call if he develops any new problems.  [name], MD, FACS Beeper [phone] Office: [phone]   HPI:   [name] is a pleasant 78 y.o. male, who presents for evaluation of varicose veins of the left leg.  The patient had 2 bleeding episodes of varicose veins in the left leg.  He had an episode in March with significant bleeding and then again most recently 3 weeks ago he had significant bleeding from a varicosity in his left lower leg.  This required transport to the emergency department.  He is unaware of any previous history of DVT or phlebitis.  He does not describe significant pain in his legs with standing.  He does wear compression stockings but does not know the  exact gradient.  He elevates his legs sometimes.  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  He is otherwise fairly healthy.  He does have a history of some congestive heart failure.  I do not get any history of claudication, rest pain, or nonhealing ulcers.  Past Medical History:  Diagnosis Date  . Ascending aorta dilatation (HCC)    46mm by chest CT angio 04/2018  . CAD (coronary artery disease)    a. 05/2006 Abnl stress test w/ 2-45mm ST dep; b. 05/2006 Cath/PCI: LM nl, LAD 30-40ost, 20-30p, 90d (small), D1 nl, D2 nl, LCX nl, OM1/2 nl, RCA Ca2+, 72m/d (3.5x16 Liberty BMS & 3.5x12 Liberty BMS), EF 50%.  . Chronic diastolic CHF (congestive heart failure) ([place])    a. 10/2017 Echo: EF 50-55%, mild AI, sev dil LA, mod dil RA.  Marland Kitchen Chronic venous insufficiency   . HTN (hypertension)   . PAF (paroxysmal atrial fibrillation) ([place])    a. CHA2DS2VASc 5-->Eliquis; b. Prev WCT on flecainide;  c. 12/2017 s/p DCCV.  Marland Kitchen Pneumothorax   . Ventricular tachycardia (HCC)    a. possibly proarrhythmia from flecainide.    Family History  Problem Relation Age of Onset  . Coronary artery disease Other     SOCIAL HISTORY: Social History   Socioeconomic History  . Marital status: Widowed    Spouse name: Not on file  . Number of children: Not on file  . Years of education: Not on file  .  Highest education level: Not on file  Occupational History  . Occupation: retired  Scientific laboratory technician  . Financial resource strain: Not on file  . Food insecurity    Worry: Not on file    Inability: Not on file  . Transportation needs    Medical: Not on file    Non-medical: Not on file  Tobacco Use  . Smoking status: Never Smoker  . Smokeless tobacco: Never Used  Substance and Sexual Activity  . Alcohol use: No    Comment: occsaoinal  . Drug use: Yes    Types: Other-see comments  . Sexual activity: Not Currently  Lifestyle  . Physical activity    Days per week: Not on file    Minutes per session: Not on file   . Stress: Not on file  Relationships  . Social Herbalist on phone: Not on file    Gets together: Not on file    Attends religious service: Not on file    Active member of club or organization: Not on file    Attends meetings of clubs or organizations: Not on file    Relationship status: Not on file  . Intimate partner violence    Fear of current or ex partner: Not on file    Emotionally abused: Not on file    Physically abused: Not on file    Forced sexual activity: Not on file  Other Topics Concern  . Not on file  Social History Narrative   Lives in [place] by himself but 2 dtrs nearby and help out when necessary.    No Known Allergies  Current Outpatient Medications  Medication Sig Dispense Refill  . amiodarone (PACERONE) 200 MG tablet Take 1 tablet (200 mg total) by mouth daily. (Patient taking differently: Take 200 mg by mouth every evening. ) 90 tablet 3  . Ascorbic Acid (VITAMIN C PO) Take 1 tablet by mouth daily.     . Cholecalciferol (VITAMIN D3) 2000 units TABS Take 2,000 Units by mouth daily.    Marland Kitchen ELIQUIS 5 MG TABS tablet TAKE 1 TABLET BY MOUTH TWICE A DAY 180 tablet 3  . fish oil-omega-3 fatty acids 1000 MG capsule Take 2 g by mouth daily.      . furosemide (LASIX) 20 MG tablet Take 1 tablet (20 mg total) by mouth daily. 90 tablet 3  . glucosamine-chondroitin 500-400 MG tablet Take 1 tablet by mouth 2 (two) times daily.     . Multiple Vitamin (MULTIVITAMIN) tablet Take 1 tablet by mouth daily.      . nitroGLYCERIN (NITROSTAT) 0.4 MG SL tablet Place 1 tablet (0.4 mg total) under the tongue every 5 (five) minutes as needed for chest pain. 25 tablet 3  . pindolol (VISKEN) 10 MG tablet Take 5 mg by mouth 2 (two) times daily.     . Tamsulosin HCl (FLOMAX) 0.4 MG CAPS Take 0.4 mg by mouth 2 (two) times daily.      . vitamin E 400 UNIT capsule Take 400 Units by mouth daily.    Marland Kitchen atorvastatin (LIPITOR) 20 MG tablet TAKE 1 TABLET BY MOUTH EVERY DAY (Patient not taking:  Reported on 02/23/2019) 90 tablet 3  . pindolol (VISKEN) 5 MG tablet Take 1 tablet (5 mg total) by mouth 2 (two) times daily. (Patient not taking: Reported on 02/01/2019) 180 tablet 3   No current facility-administered medications for this visit.     REVIEW OF SYSTEMS:  [X]  denotes positive finding, [ ]  denotes  negative finding Cardiac  Comments:  Chest pain or chest pressure:    Shortness of breath upon exertion:    Short of breath when lying flat:    Irregular heart rhythm:        Vascular    Pain in calf, thigh, or hip brought on by ambulation:    Pain in feet at night that wakes you up from your sleep:     Blood clot in your veins:    Leg swelling:         Pulmonary    Oxygen at home:    Productive cough:     Wheezing:         Neurologic    Sudden weakness in arms or legs:     Sudden numbness in arms or legs:     Sudden onset of difficulty speaking or slurred speech:    Temporary loss of vision in one eye:     Problems with dizziness:         Gastrointestinal    Blood in stool:     Vomited blood:         Genitourinary    Burning when urinating:     Blood in urine:        Psychiatric    Major depression:         Hematologic    Bleeding problems:    Problems with blood clotting too easily:        Skin    Rashes or ulcers:        Constitutional    Fever or chills:     PHYSICAL EXAM:   Vitals:   02/23/19 1419  BP: (!) 144/73  Pulse: (!) 53  Resp: 16  Temp: 97.7 F (36.5 C)  TempSrc: Temporal  SpO2: 98%  Weight: 157 lb 3.2 oz (71.3 kg)  Height: 6' 2.4" (1.89 m)    GENERAL: The patient is a well-nourished male, in no acute distress. The vital signs are documented above. CARDIAC: There is a regular rate and rhythm.  VASCULAR: I do not detect carotid bruits. He has palpable pedal pulses. He has significant hyperpigmentation on the left leg. He has corona phlebectatica of the left foot where he has had the 2 venous bleeding episodes.       PULMONARY: There is good air exchange bilaterally without wheezing or rales. ABDOMEN: Soft and non-tender with normal pitched bowel sounds.  MUSCULOSKELETAL: There are no major deformities or cyanosis. NEUROLOGIC: No focal weakness or paresthesias are detected. SKIN: There are no ulcers or rashes noted. PSYCHIATRIC: The patient has a normal affect.  DATA:    VENOUS DUPLEX: I have independently interpreted his venous duplex scan today of the left lower extremity.  There is no evidence of deep venous thrombosis or superficial venous thrombosis.  He does have deep venous reflux involving the common femoral vein, femoral vein, and popliteal vein.  He does have some reflux in the superficial system involving the saphenofemoral junction and proximal right great saphenous vein however the vein is not especially dilated.  He has some reflux in the proximal small saphenous vein but again the vein is not especially dilated.

## 2019-02-27 ENCOUNTER — Telehealth: Payer: Self-pay | Admitting: Cardiology

## 2019-02-27 NOTE — Telephone Encounter (Signed)
Left message to call back  

## 2019-02-27 NOTE — Telephone Encounter (Signed)
I spoke with pt. He played message back for me. Message was from Odin on July 31,2020.  This has already been addressed.  See phone note from July 30,2020.  I gave pt information from pharmacist in this note.

## 2019-02-27 NOTE — Telephone Encounter (Signed)
  Patient states he is returning a call from Friday

## 2019-03-14 ENCOUNTER — Telehealth: Payer: Self-pay | Admitting: *Deleted

## 2019-03-14 NOTE — Telephone Encounter (Signed)
Returning [name]'s call.  Left detailed telephone message reviewing results of venous reflux exam and his appointment with Dr. Scot Dock on 02-23-2019 including  Dr. Nicole Cella recommendations of elevation when sitting, wearing knee high compression hose and avoiding prolonged sitting and standing.

## 2019-03-30 ENCOUNTER — Telehealth: Payer: Self-pay | Admitting: Cardiology

## 2019-03-30 MED ORDER — FUROSEMIDE 20 MG PO TABS: 20.0000 mg | ORAL_TABLET | Freq: Every day | ORAL | 3 refills | Status: DC

## 2019-03-30 NOTE — Telephone Encounter (Signed)
Patient is returning call.  °

## 2019-03-30 NOTE — Telephone Encounter (Signed)
New Message:     Pt wants to know if he is supposed to continue taking Furosemide?

## 2019-03-30 NOTE — Telephone Encounter (Signed)
I spoke to the patient who is calling for a refill on Lasix and 6 month f/u with Dr Radford Pax.

## 2019-03-30 NOTE — Telephone Encounter (Signed)
Follow up ° ° °Patient is returning call. Please call. °

## 2019-03-30 NOTE — Telephone Encounter (Signed)
lpmtcb 9/10

## 2019-03-30 NOTE — Telephone Encounter (Signed)
I spoke to the patient and confirmed appointment with Dr Radford Pax.  He verbalized understanding.

## 2019-04-12 NOTE — Progress Notes (Signed)
Cardiology Office Note:    Date:  04/13/2019   ID:  [name], DOB 01-Nov-1940, MRN [id number]  PCP:  Seward Carol, MD  Cardiologist:  Fransico Him, MD    Referring MD: Seward Carol, MD   Chief Complaint  Patient presents with  . Coronary Artery Disease  . Hypertension  . Atrial Fibrillation  . Hyperlipidemia    History of Present Illness:    [name] is a 78 y.o. 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    He is here today for followup and is doing well.  He denies any chest pain or pressure, SOB, DOE, PND, orthopnea, LE edema, dizziness, palpitations or syncope. He is compliant with his meds and is tolerating meds with no SE.    Past Medical History:  Diagnosis Date  . Ascending aorta dilatation (HCC)    81mm by chest CT angio 04/2018  . CAD (coronary artery disease)    a. 05/2006 Abnl stress test w/ 2-38mm ST dep; b. 05/2006 Cath/PCI: LM nl, LAD 30-40ost, 20-30p, 90d (small), D1 nl, D2 nl, LCX nl, OM1/2 nl, RCA Ca2+, 79m/d (3.5x16 Liberty BMS & 3.5x12 Liberty BMS), EF 50%.  . Chronic diastolic CHF (congestive heart failure) ([place])    a. 10/2017 Echo: EF 50-55%, mild AI, sev dil LA, mod dil RA.  Marland Kitchen Chronic venous insufficiency   . HTN (hypertension)   . PAF (paroxysmal atrial fibrillation) ([place])    a. CHA2DS2VASc 5-->Eliquis; b. Prev WCT on flecainide;  c. 12/2017 s/p DCCV.  Marland Kitchen Pneumothorax   . Ventricular tachycardia (HCC)    a. possibly proarrhythmia from flecainide.    Past Surgical History:  Procedure Laterality Date  . CARDIOVERSION N/A 12/23/2017   Procedure: CARDIOVERSION;  Surgeon: Sueanne Margarita, MD;  Location: [facility] ENDOSCOPY;  Service: Cardiovascular;  Laterality: N/A;  . CARDIOVERSION N/A 03/08/2018   Procedure:  CARDIOVERSION;  Surgeon: Sueanne Margarita, MD;  Location: [facility] ENDOSCOPY;  Service: Cardiovascular;  Laterality: N/A;  . INGUINAL HERNIA REPAIR     x2  . TEE WITHOUT CARDIOVERSION N/A 03/08/2018   Procedure: TRANSESOPHAGEAL ECHOCARDIOGRAM (TEE);  Surgeon: Sueanne Margarita, MD;  Location: [facility] ENDOSCOPY;  Service: Cardiovascular;  Laterality: N/A;  . TOTAL KNEE ARTHROPLASTY Left 02/28/2018   Procedure: LEFT TOTAL KNEE ARTHROPLASTY;  Surgeon: Gaynelle Arabian, MD;  Location: [facility] ORS;  Service: Orthopedics;  Laterality: Left;    Current Medications: Current Meds  Medication Sig  . amiodarone (PACERONE) 200 MG tablet Take 1 tablet (200 mg total) by mouth daily. (Patient taking differently: Take 200 mg by mouth every evening. )  . Ascorbic Acid (VITAMIN C PO) Take 1 tablet by mouth daily.   Marland Kitchen atorvastatin (LIPITOR) 20 MG tablet TAKE 1 TABLET BY MOUTH EVERY DAY  . Cholecalciferol (VITAMIN D3) 2000 units TABS Take 2,000 Units by mouth daily.  Marland Kitchen ELIQUIS 5 MG TABS tablet TAKE 1 TABLET BY MOUTH TWICE A DAY  . fish oil-omega-3 fatty acids 1000 MG capsule Take 2 g by mouth daily.    . furosemide (LASIX) 20 MG tablet Take 1 tablet (20 mg total) by mouth daily.  Marland Kitchen glucosamine-chondroitin 500-400 MG tablet Take 1 tablet by mouth 2 (two) times daily.   . Multiple Vitamin (MULTIVITAMIN) tablet Take 1 tablet by  mouth daily.    . nitroGLYCERIN (NITROSTAT) 0.4 MG SL tablet Place 1 tablet (0.4 mg total) under the tongue every 5 (five) minutes as needed for chest pain.  . pindolol (VISKEN) 5 MG tablet Take 1 tablet (5 mg total) by mouth 2 (two) times daily.  . Tamsulosin HCl (FLOMAX) 0.4 MG CAPS Take 0.4 mg by mouth 2 (two) times daily.    . vitamin E 400 UNIT capsule Take 400 Units by mouth daily.     Allergies:   Patient has no known allergies.   Social History   Socioeconomic History  . Marital status: Widowed    Spouse name: Not on file  . Number of children: Not on file  . Years of education: Not on file  .  Highest education level: Not on file  Occupational History  . Occupation: retired  Scientific laboratory technician  . Financial resource strain: Not on file  . Food insecurity    Worry: Not on file    Inability: Not on file  . Transportation needs    Medical: Not on file    Non-medical: Not on file  Tobacco Use  . Smoking status: Never Smoker  . Smokeless tobacco: Never Used  Substance and Sexual Activity  . Alcohol use: No    Comment: occsaoinal  . Drug use: Yes    Types: Other-see comments  . Sexual activity: Not Currently  Lifestyle  . Physical activity    Days per week: Not on file    Minutes per session: Not on file  . Stress: Not on file  Relationships  . Social Herbalist on phone: Not on file    Gets together: Not on file    Attends religious service: Not on file    Active member of club or organization: Not on file    Attends meetings of clubs or organizations: Not on file    Relationship status: Not on file  Other Topics Concern  . Not on file  Social History Narrative   Lives in [place] by himself but 2 dtrs nearby and help out when necessary.     Family History: The patient's family history includes Coronary artery disease in an other family member.  ROS:   Please see the history of present illness.    ROS  All other systems reviewed and negative.   EKGs/Labs/Other Studies Reviewed:    The following studies were reviewed today: none  EKG:  EKG is  ordered today.  The ekg ordered today demonstrates sinus bradycardia at 45bpm with early repol  Recent Labs: 07/06/2018: ALT 20; TSH 2.110 07/15/2018: BUN 14; Creatinine, Ser 1.25; Potassium 4.3; Sodium 142 09/14/2018: Hemoglobin 11.1; Platelets 219   Recent Lipid Panel    Component Value Date/Time   CHOL 127 07/06/2018 0817   TRIG 40 07/06/2018 0817   HDL 58 07/06/2018 0817   CHOLHDL 2.2 07/06/2018 0817   CHOLHDL 1.9 06/19/2016 0809   VLDL 7 06/19/2016 0809   LDLCALC 61 07/06/2018 0817    Physical Exam:     VS:  Ht 6\' 2"  (1.88 m)   Wt 158 lb 3.2 oz (71.8 kg)   BMI 20.31 kg/m     Wt Readings from Last 3 Encounters:  04/13/19 158 lb 3.2 oz (71.8 kg)  02/23/19 157 lb 3.2 oz (71.3 kg)  02/01/19 156 lb 1.4 oz (70.8 kg)     GEN:  Well nourished, well developed in no acute distress HEENT: Normal NECK: No JVD; No  carotid bruits LYMPHATICS: No lymphadenopathy CARDIAC: RRR, no murmurs, rubs, gallops RESPIRATORY:  Clear to auscultation without rales, wheezing or rhonchi  ABDOMEN: Soft, non-tender, non-distended MUSCULOSKELETAL:  No edema; No deformity  SKIN: Warm and dry NEUROLOGIC:  Alert and oriented x 3 PSYCHIATRIC:  Normal affect   ASSESSMENT:    1. Coronary artery disease involving native coronary artery of native heart without angina pectoris   2. HYPERTENSION, BENIGN   3. Chronic diastolic CHF (congestive heart failure) (HCC)   4. Persistent atrial fibrillation   5. Ascending aorta dilatation (HCC)   6. Dyslipidemia    PLAN:    In order of problems listed above:  1.  ASCAD - s/p BMS to RCA -no anginal sx since last OV -continue BB and statin -not on ASA due to DOAC  2.  HTN -BP well controlled -continue Pindolol 5mg  BID  3.  Chronic diastolic CHF -he appears euvolemic on exam today -continue lasix 40mg  daily -creatinine 1.25 on 06/2018  4.  Persistent atrial fibrillation -he is maintaining NSR on exam today.   -continue Amio 200mg  daily and Pindolol 5mg  BID -no bleeding issues on DOAC -continue apixaban 5mg  BID -TSH and LFTs normal 06/2018.   -he is up to date on PFTs.   -check BMET and CBC  5.  Ascending aortic aneurysm -chest CTA a year ago 2mm -repeat Chest CTA for stability -continue statin and good BP control -reminded not to do any upper body weight lifting  6.  HLD -LDL goal < 70 -continue atorvastatin 20mg  daily -LDL at goal at 61 in 06/2019  7.  Asymptomatic bradycardia -his HR is 45bpm today secondary to Pindolol for suppression of PAF  but he is completely asymptomatic and works out at [employer] with no problems. -I have asked him to let me know if he has any problems with dizziness, exertional fatigue or exercise intolerance.  -for now will keep on current dose of Pindolol as he has had some breakthrough of PAF in the past and this seems to be holding him in NSR. -I will get a 24 hour Holter to assess average HR   Medication Adjustments/Labs and Tests Ordered: Current medicines are reviewed at length with the patient today.  Concerns regarding medicines are outlined above.  No orders of the defined types were placed in this encounter.  No orders of the defined types were placed in this encounter.   Signed, Fransico Him, MD  04/13/2019 9:23 AM    [place]

## 2019-04-13 ENCOUNTER — Encounter: Payer: Self-pay | Admitting: Cardiology

## 2019-04-13 ENCOUNTER — Ambulatory Visit (INDEPENDENT_AMBULATORY_CARE_PROVIDER_SITE_OTHER): Payer: Medicare Other | Admitting: Cardiology

## 2019-04-13 ENCOUNTER — Other Ambulatory Visit: Payer: Self-pay

## 2019-04-13 ENCOUNTER — Telehealth: Payer: Self-pay

## 2019-04-13 VITALS — BP 143/66 | HR 45 | Ht 74.0 in | Wt 158.2 lb

## 2019-04-13 DIAGNOSIS — I10 Essential (primary) hypertension: Secondary | ICD-10-CM | POA: Diagnosis not present

## 2019-04-13 DIAGNOSIS — E785 Hyperlipidemia, unspecified: Secondary | ICD-10-CM

## 2019-04-13 DIAGNOSIS — I4819 Other persistent atrial fibrillation: Secondary | ICD-10-CM

## 2019-04-13 DIAGNOSIS — I495 Sick sinus syndrome: Secondary | ICD-10-CM

## 2019-04-13 DIAGNOSIS — I5032 Chronic diastolic (congestive) heart failure: Secondary | ICD-10-CM

## 2019-04-13 DIAGNOSIS — R001 Bradycardia, unspecified: Secondary | ICD-10-CM

## 2019-04-13 DIAGNOSIS — I2510 Atherosclerotic heart disease of native coronary artery without angina pectoris: Secondary | ICD-10-CM | POA: Diagnosis not present

## 2019-04-13 DIAGNOSIS — I77810 Thoracic aortic ectasia: Secondary | ICD-10-CM

## 2019-04-13 LAB — BASIC METABOLIC PANEL
BUN/Creatinine Ratio: 11 (ref 10–24)
BUN: 15 mg/dL (ref 8–27)
CO2: 25 mmol/L (ref 20–29)
Calcium: 9.3 mg/dL (ref 8.6–10.2)
Chloride: 104 mmol/L (ref 96–106)
Creatinine, Ser: 1.35 mg/dL — ABNORMAL HIGH (ref 0.76–1.27)
GFR calc Af Amer: 57 mL/min/{1.73_m2} — ABNORMAL LOW (ref >59)
GFR calc non Af Amer: 50 mL/min/{1.73_m2} — ABNORMAL LOW (ref >59)
Glucose: 103 mg/dL — ABNORMAL HIGH (ref 65–99)
Potassium: 4.4 mmol/L (ref 3.5–5.2)
Sodium: 141 mmol/L (ref 134–144)

## 2019-04-13 LAB — LIPID PANEL
Chol/HDL Ratio: 2 ratio (ref 0.0–5.0)
Cholesterol, Total: 118 mg/dL (ref 100–199)
HDL: 58 mg/dL (ref >39)
LDL Chol Calc (NIH): 51 mg/dL (ref 0–99)
Triglycerides: 33 mg/dL (ref 0–149)
VLDL Cholesterol Cal: 9 mg/dL (ref 5–40)

## 2019-04-13 LAB — CBC
Hematocrit: 28.5 % — ABNORMAL LOW (ref 37.5–51.0)
Hemoglobin: 8.6 g/dL — ABNORMAL LOW (ref 13.0–17.7)
MCH: 22.2 pg — ABNORMAL LOW (ref 26.6–33.0)
MCHC: 30.2 g/dL — ABNORMAL LOW (ref 31.5–35.7)
MCV: 74 fL — ABNORMAL LOW (ref 79–97)
Platelets: 225 10*3/uL (ref 150–450)
RBC: 3.87 x10E6/uL — ABNORMAL LOW (ref 4.14–5.80)
RDW: 16.6 % — ABNORMAL HIGH (ref 11.6–15.4)
WBC: 4.6 10*3/uL (ref 3.4–10.8)

## 2019-04-13 LAB — ALT: ALT: 17 IU/L (ref 0–44)

## 2019-04-13 NOTE — Telephone Encounter (Signed)
Patient returned call and would like a call back.

## 2019-04-13 NOTE — Patient Instructions (Signed)
Medication Instructions:  Your physician recommends that you continue on your current medications as directed. Please refer to the Current Medication list given to you today.  If you need a refill on your cardiac medications before your next appointment, please call your pharmacy.   Lab work: Your physician recommends that you return for lab work today: BMET CBC ALT Fasting Lipid Profile  If you have labs (blood work) drawn today and your tests are completely normal, you will receive your results only by: Marland Kitchen MyChart Message (if you have MyChart) OR . A paper copy in the mail If you have any lab test that is abnormal or we need to change your treatment, we will call you to review the results.  Testing/Procedures: None Ordered  Follow-Up: At [facility], you and your health needs are our priority.  As part of our continuing mission to provide you with exceptional heart care, we have created designated Provider Care Teams.  These Care Teams include your primary Cardiologist (physician) and Advanced Practice Providers (APPs -  Physician Assistants and Nurse Practitioners) who all work together to provide you with the care you need, when you need it. You will need a virtual follow up appointment in 6 months.  Please call our office 2 months in advance to schedule this appointment.  You may see Fransico Him, MD or one of the following Advanced Practice Providers on your designated Care Team:   [place], PA-C Melina Copa, PA-C . Ermalinda Barrios, PA-C

## 2019-04-13 NOTE — Telephone Encounter (Signed)
Phoned and left voice message (OK per DPR) informing patient that Dr. Radford Pax would like him to wear a 3 day heart monitor and requested call back for further instructions.

## 2019-04-13 NOTE — Telephone Encounter (Signed)
Phoned patient, explained that Dr. Radford Pax would like him to wear a 3 day heart monitor to further assess heart rhythm. Informed that monitor would be sent to his house and described process and support that would be available for assistance he would need. Questions answered to patient's satisfaction.

## 2019-04-14 ENCOUNTER — Emergency Department (HOSPITAL_COMMUNITY)
Admission: EM | Admit: 2019-04-14 | Discharge: 2019-04-14 | Disposition: A | Discharge status: Home / Self Care | Payer: Medicare Other | Attending: Emergency Medicine | Admitting: Emergency Medicine

## 2019-04-14 ENCOUNTER — Other Ambulatory Visit: Payer: Self-pay

## 2019-04-14 ENCOUNTER — Encounter (HOSPITAL_COMMUNITY): Payer: Self-pay | Admitting: Emergency Medicine

## 2019-04-14 ENCOUNTER — Telehealth: Payer: Self-pay | Admitting: Cardiology

## 2019-04-14 DIAGNOSIS — D649 Anemia, unspecified: Secondary | ICD-10-CM | POA: Insufficient documentation

## 2019-04-14 DIAGNOSIS — I2510 Atherosclerotic heart disease of native coronary artery without angina pectoris: Secondary | ICD-10-CM | POA: Insufficient documentation

## 2019-04-14 DIAGNOSIS — I110 Hypertensive heart disease with heart failure: Secondary | ICD-10-CM | POA: Insufficient documentation

## 2019-04-14 DIAGNOSIS — R7989 Other specified abnormal findings of blood chemistry: Secondary | ICD-10-CM | POA: Diagnosis present

## 2019-04-14 DIAGNOSIS — Z7901 Long term (current) use of anticoagulants: Secondary | ICD-10-CM | POA: Insufficient documentation

## 2019-04-14 DIAGNOSIS — R001 Bradycardia, unspecified: Secondary | ICD-10-CM | POA: Insufficient documentation

## 2019-04-14 DIAGNOSIS — I509 Heart failure, unspecified: Secondary | ICD-10-CM | POA: Diagnosis not present

## 2019-04-14 DIAGNOSIS — Z79899 Other long term (current) drug therapy: Secondary | ICD-10-CM | POA: Diagnosis not present

## 2019-04-14 LAB — COMPREHENSIVE METABOLIC PANEL
ALT: 20 U/L (ref 0–44)
AST: 22 U/L (ref 15–41)
Albumin: 4.1 g/dL (ref 3.5–5.0)
Alkaline Phosphatase: 57 U/L (ref 38–126)
Anion gap: 9 (ref 5–15)
BUN: 16 mg/dL (ref 8–23)
CO2: 27 mmol/L (ref 22–32)
Calcium: 9.1 mg/dL (ref 8.9–10.3)
Chloride: 104 mmol/L (ref 98–111)
Creatinine, Ser: 1.37 mg/dL — ABNORMAL HIGH (ref 0.61–1.24)
GFR calc Af Amer: 56 mL/min — ABNORMAL LOW (ref >60)
GFR calc non Af Amer: 49 mL/min — ABNORMAL LOW (ref >60)
Glucose, Bld: 99 mg/dL (ref 70–99)
Potassium: 4.1 mmol/L (ref 3.5–5.1)
Sodium: 140 mmol/L (ref 135–145)
Total Bilirubin: 0.9 mg/dL (ref 0.3–1.2)
Total Protein: 6.7 g/dL (ref 6.5–8.1)

## 2019-04-14 LAB — TYPE AND SCREEN
ABO/RH(D): O POS
Antibody Screen: NEGATIVE

## 2019-04-14 LAB — CBC
HCT: 30.6 % — ABNORMAL LOW (ref 39.0–52.0)
Hemoglobin: 8.8 g/dL — ABNORMAL LOW (ref 13.0–17.0)
MCH: 21.7 pg — ABNORMAL LOW (ref 26.0–34.0)
MCHC: 28.8 g/dL — ABNORMAL LOW (ref 30.0–36.0)
MCV: 75.6 fL — ABNORMAL LOW (ref 80.0–100.0)
Platelets: 237 10*3/uL (ref 150–400)
RBC: 4.05 MIL/uL — ABNORMAL LOW (ref 4.22–5.81)
RDW: 18.2 % — ABNORMAL HIGH (ref 11.5–15.5)
WBC: 5.7 10*3/uL (ref 4.0–10.5)
nRBC: 0 % (ref 0.0–0.2)

## 2019-04-14 MED ORDER — FERROUS SULFATE 325 (65 FE) MG PO TABS: 325.0000 mg | ORAL_TABLET | Freq: Every day | ORAL | 0 refills | Status: DC

## 2019-04-14 NOTE — Telephone Encounter (Signed)
Pts daughter Caprice Beaver for a call back but she is not noted on the patients DPR... LM for [place], pts daughter on the DPR to please call our office back.

## 2019-04-14 NOTE — Discharge Instructions (Addendum)
Decrease your pintolol to 2.5 mg daily   Take iron tablets daily to get your blood counts up. Your hemoglobin is 8.8 today   See your doctor in a week to recheck hemoglobin   Return to ER if you have chest pain, trouble breathing, passing out

## 2019-04-14 NOTE — Telephone Encounter (Signed)
Spoke with [place] the pts daughter RE; the pts lab results and the pt is on his way to the ED... she will talk with her sister Katharine Look.

## 2019-04-14 NOTE — ED Notes (Signed)
No issues reported during orthostatic vital signs. Pt steady at bedside.

## 2019-04-14 NOTE — ED Notes (Signed)
Pt ambulated w/o assist from lobby to RM 22.

## 2019-04-14 NOTE — ED Triage Notes (Signed)
Pt reports had lab work done at follow up appt yesterday and was called today to go to ED due to low Hgb. Pt on Eliquis but denies any bloody or dark stools, no vomiting blood.

## 2019-04-14 NOTE — ED Provider Notes (Signed)
[facility] DEPT Provider Note   CSN: [id number] Arrival date & time: 04/14/19  1236     History   Chief Complaint Chief Complaint  Patient presents with  . Abnormal Lab  . low Hgb    HPI [name] is a 78 y.o. male hx of ascending aortic aneurysm, CAD, here presenting with anemia, bradycardia.  Patient states that he noticed that his heart rate has been running lower for the last several weeks.  He states that he is very active and denies any chest pain or shortness of breath.  He has a pulse ox at home and apparently reads somewhere in the 40s. He saw his cardiologist yesterday and today was called because his hemoglobin was 8.6.  His previous hemoglobin was 11.  He states that he feels fine and denies any black stools or blood in his stools.  He does take Eliquis.     The history is provided by the patient.    Past Medical History:  Diagnosis Date  . Ascending aorta dilatation (HCC)    66mm by chest CT angio 04/2018  . CAD (coronary artery disease)    a. 05/2006 Abnl stress test w/ 2-47mm ST dep; b. 05/2006 Cath/PCI: LM nl, LAD 30-40ost, 20-30p, 90d (small), D1 nl, D2 nl, LCX nl, OM1/2 nl, RCA Ca2+, 11m/d (3.5x16 [place] BMS & 3.5x12 [place] BMS), EF 50%.  . Chronic diastolic CHF (congestive heart failure) ([place])    a. 10/2017 Echo: EF 50-55%, mild AI, sev dil LA, mod dil RA.  Marland Kitchen Chronic venous insufficiency   . HTN (hypertension)   . PAF (paroxysmal atrial fibrillation) ([place])    a. CHA2DS2VASc 5-->Eliquis; b. Prev WCT on flecainide;  c. 12/2017 s/p DCCV.  Marland Kitchen Pneumothorax   . Ventricular tachycardia (HCC)    a. possibly proarrhythmia from flecainide.    Patient Active Problem List   Diagnosis Date Noted  . Bilateral lower extremity edema 05/24/2018  . Persistent atrial fibrillation 03/16/2018  . Ascending aorta dilatation (HCC)   . OA (osteoarthritis) of knee 02/28/2018  . Chronic diastolic CHF (congestive heart failure) ([place])   . CAD  (coronary artery disease) 06/26/2013  . Ventricular tachycardia ([place])   . Chronic venous insufficiency   . Dyslipidemia 06/08/2011  . 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 HYPERTENSION, BENIGN 05/09/2009    Past Surgical History:  Procedure Laterality Date  . CARDIOVERSION N/A 12/23/2017   Procedure: CARDIOVERSION;  Surgeon: Sueanne Margarita, MD;  Location: [facility] ENDOSCOPY;  Service: Cardiovascular;  Laterality: N/A;  . CARDIOVERSION N/A 03/08/2018   Procedure: CARDIOVERSION;  Surgeon: Sueanne Margarita, MD;  Location: [facility] ENDOSCOPY;  Service: Cardiovascular;  Laterality: N/A;  . INGUINAL HERNIA REPAIR     x2  . TEE WITHOUT CARDIOVERSION N/A 03/08/2018   Procedure: TRANSESOPHAGEAL ECHOCARDIOGRAM (TEE);  Surgeon: Sueanne Margarita, MD;  Location: [facility] ENDOSCOPY;  Service: Cardiovascular;  Laterality: N/A;  . TOTAL KNEE ARTHROPLASTY Left 02/28/2018   Procedure: LEFT TOTAL KNEE ARTHROPLASTY;  Surgeon: Gaynelle Arabian, MD;  Location: [facility] ORS;  Service: Orthopedics;  Laterality: Left;        Home Medications    Prior to Admission medications   Medication Sig Start Date End Date Taking? Authorizing Provider  amiodarone (PACERONE) 200 MG tablet Take 1 tablet (200 mg total) by mouth daily. Patient taking differently: Take 200 mg by mouth every evening.  09/14/18 09/14/19  Sueanne Margarita, MD  Ascorbic Acid (VITAMIN C PO) Take 1 tablet by mouth daily.  [provider]  atorvastatin (LIPITOR) 20 MG tablet TAKE 1 TABLET BY MOUTH EVERY DAY 08/10/18   Sueanne Margarita, MD  Cholecalciferol (VITAMIN D3) 2000 units TABS Take 2,000 Units by mouth daily.    [provider]  ELIQUIS 5 MG TABS tablet TAKE 1 TABLET BY MOUTH TWICE A DAY 10/03/18   Turner, Eber Hong, MD  fish oil-omega-3 fatty acids 1000 MG capsule Take 2 g by mouth daily.      [provider]  furosemide (LASIX) 20 MG tablet Take 1 tablet (20 mg total) by mouth daily. 03/30/19 03/29/20  Sueanne Margarita, MD  glucosamine-chondroitin  500-400 MG tablet Take 1 tablet by mouth 2 (two) times daily.     [provider]  Multiple Vitamin (MULTIVITAMIN) tablet Take 1 tablet by mouth daily.      [provider]  nitroGLYCERIN (NITROSTAT) 0.4 MG SL tablet Place 1 tablet (0.4 mg total) under the tongue every 5 (five) minutes as needed for chest pain. 10/18/17   Sueanne Margarita, MD  pindolol (VISKEN) 5 MG tablet Take 1 tablet (5 mg total) by mouth 2 (two) times daily. 09/14/18 09/14/19  Sueanne Margarita, MD  Tamsulosin HCl (FLOMAX) 0.4 MG CAPS Take 0.4 mg by mouth 2 (two) times daily.      [provider]  vitamin E 400 UNIT capsule Take 400 Units by mouth daily.    [provider]    Family History Family History  Problem Relation Age of Onset  . Coronary artery disease Other     Social History Social History   Tobacco Use  . Smoking status: Never Smoker  . Smokeless tobacco: Never Used  Substance Use Topics  . Alcohol use: No    Comment: occsaoinal  . Drug use: Yes    Types: Other-see comments     Allergies   Patient has no known allergies.   Review of Systems Review of Systems  Cardiovascular:       Bradycardia   All other systems reviewed and are negative.    Physical Exam Updated Vital Signs BP (!) 155/74   Pulse (!) 45   Temp 98.3 F (36.8 C) (Oral)   Resp 14   SpO2 98%   Physical Exam Vitals signs and nursing note reviewed.  Constitutional:      Appearance: Normal appearance.  HENT:     Head: Normocephalic.     Nose: Nose normal.     Mouth/Throat:     Mouth: Mucous membranes are moist.  Eyes:     Extraocular Movements: Extraocular movements intact.     Pupils: Pupils are equal, round, and reactive to light.  Neck:     Musculoskeletal: Normal range of motion.  Cardiovascular:     Rate and Rhythm: Regular rhythm.     Comments: Bradycardic  Pulmonary:     Effort: Pulmonary effort is normal.     Breath sounds: Normal breath sounds.  Abdominal:      General: Abdomen is flat.     Palpations: Abdomen is soft.  Musculoskeletal: Normal range of motion.  Skin:    Capillary Refill: Capillary refill takes less than 2 seconds.  Neurological:     General: No focal deficit present.     Mental Status: He is alert and oriented to person, place, and time.  Psychiatric:        Mood and Affect: Mood normal.        Behavior: Behavior normal.  ED Treatments / Results  Labs (all labs ordered are listed, but only abnormal results are displayed) Labs Reviewed  COMPREHENSIVE METABOLIC PANEL - Abnormal; Notable for the following components:      Result Value   Creatinine, Ser 1.37 (*)    GFR calc non Af Amer 49 (*)    GFR calc Af Amer 56 (*)    All other components within normal limits  CBC - Abnormal; Notable for the following components:   RBC 4.05 (*)    Hemoglobin 8.8 (*)    HCT 30.6 (*)    MCV 75.6 (*)    MCH 21.7 (*)    MCHC 28.8 (*)    RDW 18.2 (*)    All other components within normal limits  TYPE AND SCREEN    EKG EKG Interpretation  Date/Time:  Friday April 14 2019 17:37:26 EDT Ventricular Rate:  44 PR Interval:    QRS Duration: 99 QT Interval:  509 QTC Calculation: 436 R Axis:   81 Text Interpretation:  sinus bradycardia Borderline right axis deviation Nonspecific T abnrm, anterolateral leads No significant change since last tracing Reconfirmed by Wandra Arthurs [phone]) on 04/14/2019 6:06:07 PM   Radiology No results found.  Procedures Procedures (including critical care time)  Medications Ordered in ED Medications - No data to display   Initial Impression / Assessment and Plan / ED Course  I have reviewed the triage vital signs and the nursing notes.  Pertinent labs & imaging results that were available during my care of the patient were reviewed by me and considered in my medical decision making (see chart for details).       [name] is a 78 y.o. male here with bradycardia, possible  anemia. Patient's Hg was 8.6 yesterday. No melena. Pulse around mid 40s. No chest pain or shortness of breath. Will get repeat CBC, chemistry. Will consult cardiology   6:22 PM Patient's repeat CBC showed Hg 8.8. Labs otherwise unremarkable. I talked to Dr. Oval Linsey. She recommend decrease pindolol to 2.5 mg daily and she will notify Dr. Radford Pax.    Final Clinical Impressions(s) / ED Diagnoses   Final diagnoses:  None    ED Discharge Orders    None       Drenda Freeze, MD 04/14/19 1825

## 2019-04-14 NOTE — Telephone Encounter (Signed)
Patient's daughter was calling about her father's lab results.  He was advised to go to the ED for further eval., she was to know a little more information as she lives over an hour away.

## 2019-04-17 NOTE — Telephone Encounter (Signed)
Follow Up ° °Patient is returning call. Please give patient a call back.  °

## 2019-04-17 NOTE — Telephone Encounter (Signed)
Jesse Margarita, MD  P Cv Div Ch St Triage        Please have him followup with his PCP   Previous Messages      Result Notes for Lipid Profile  Notes recorded by Nuala Alpha, LPN on [id number] at [id number] PM EDT  Spoke with the pt and informed him that per Dr Radford Pax, she recommends that he follow-up with his PCP, in regards to previously low hemoglobin, and being sent to the ER for this.  Informed the pt that I will route this result to his PCP Dr. Delfina Redwood, for his review.  Pt verbalized understanding and agrees with this plan.

## 2019-04-19 ENCOUNTER — Telehealth: Payer: Self-pay | Admitting: *Deleted

## 2019-04-19 NOTE — Telephone Encounter (Signed)
3 day ZIO XT long term holter monitor to be mailed to the patients home.  Instructions reviewed briefly as they are included in the monitor kit. 

## 2019-04-24 ENCOUNTER — Ambulatory Visit (INDEPENDENT_AMBULATORY_CARE_PROVIDER_SITE_OTHER): Payer: Medicare Other

## 2019-04-24 DIAGNOSIS — I4819 Other persistent atrial fibrillation: Secondary | ICD-10-CM

## 2019-04-24 DIAGNOSIS — R001 Bradycardia, unspecified: Secondary | ICD-10-CM

## 2019-05-03 ENCOUNTER — Inpatient Hospital Stay: Admission: RE | Admit: 2019-05-03 | Payer: Medicare Other | Source: Ambulatory Visit

## 2019-05-10 ENCOUNTER — Telehealth: Payer: Self-pay | Admitting: Nurse Practitioner

## 2019-05-10 DIAGNOSIS — I712 Thoracic aortic aneurysm, without rupture, unspecified: Secondary | ICD-10-CM

## 2019-05-10 NOTE — Telephone Encounter (Signed)
Spoke with patient and reviewed instructions and plan for CT on Monday. Patient verbalized understanding and agreement with plan. I answered questions to his satisfaction, including verification that he has the correct address. He thanked me for the call.

## 2019-05-10 NOTE — Telephone Encounter (Signed)
Received message from [place] CT that patient's CT cannot be done at our office due to a certain requirement from patient's insurance provider. CT angio chest/aorta ordered and appointment scheduled for patient at [facility] for Monday Oct. 26 at 9:00 am. Patient will be informed to arrive at [facility] Monday morning and no solid foods for 4 hours prior. He may have liquids and take am medications.  Left messages for patient regarding the CT being rescheduled for same time and date but different location. Advised patient to call back and ask for me.

## 2019-05-15 ENCOUNTER — Telehealth: Payer: Self-pay

## 2019-05-15 ENCOUNTER — Inpatient Hospital Stay: Admission: RE | Admit: 2019-05-15 | Payer: Medicare Other | Source: Ambulatory Visit

## 2019-05-15 ENCOUNTER — Other Ambulatory Visit: Payer: Self-pay

## 2019-05-15 ENCOUNTER — Ambulatory Visit
Admission: RE | Admit: 2019-05-15 | Discharge: 2019-05-15 | Disposition: A | Discharge status: Home / Self Care | Payer: Medicare Other | Source: Ambulatory Visit | Attending: Cardiology | Admitting: Cardiology

## 2019-05-15 DIAGNOSIS — I712 Thoracic aortic aneurysm, without rupture, unspecified: Secondary | ICD-10-CM

## 2019-05-15 MED ORDER — IOPAMIDOL (ISOVUE-370) INJECTION 76%
75.0000 mL | Freq: Once | INTRAVENOUS | Status: AC | PRN
  Administered 2019-05-15: 75 mL via INTRAVENOUS

## 2019-05-15 NOTE — Telephone Encounter (Signed)
Notes recorded by Frederik Schmidt, RN on 05/15/2019 at 4:52 PM EDT  The patient has been notified of the result and verbalized understanding. All questions (if any) were answered.  Frederik Schmidt, RN 05/15/2019 4:52 PM

## 2019-05-15 NOTE — Telephone Encounter (Signed)
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  Please notify patient's PCP to followup on this

## 2019-05-15 NOTE — Telephone Encounter (Signed)
Notes recorded by Frederik Schmidt, RN on 05/15/2019 at 10:44 AM EDT  [id number] 10/26  ------

## 2019-05-24 ENCOUNTER — Telehealth (HOSPITAL_COMMUNITY): Payer: Self-pay | Admitting: Nurse Practitioner

## 2019-05-24 NOTE — Telephone Encounter (Signed)
Called and left message for patient to call back.  Need to schedule appt with Roderic Palau for a-fib seen on monitor per Dr. Ashok Norris.

## 2019-06-26 ENCOUNTER — Telehealth: Payer: Self-pay | Admitting: Cardiology

## 2019-06-26 NOTE — Telephone Encounter (Signed)
New Message  Patient states that a letter will be received by the office from [employer] on his behalf and it can be discarded. Patient states that he has decided not to go with [employer] for coverage.

## 2019-07-12 ENCOUNTER — Telehealth: Payer: Self-pay | Admitting: Cardiology

## 2019-07-12 NOTE — Telephone Encounter (Signed)
Pt takes Eliquis for afib with CHADS2VASc score of 5 (age x2, CHF, HTN, CAD). SCr 1.37, CrCl 43mL/min. Ok to hold Eliquis for 2 days prior to dental extractions.

## 2019-07-12 NOTE — Telephone Encounter (Signed)
   [facility] Pre-operative Risk Assessment    Request for surgical clearance:  1. What type of surgery is being performed? 5 TEETH TO BE EXTRACTED; 4 DENTAL IMPLANTS   2. When is this surgery scheduled? 08/02/19   3. What type of clearance is required (medical clearance vs. Pharmacy clearance to hold med vs. Both)? BOTH  4. Are there any medications that need to be held prior to surgery and how long? ELIQUIS   5. Practice name and name of physician performing surgery? DR. Frederik Schmidt   6. What is your office phone number [phone]    7.   What is your office fax number [phone]  8.   Anesthesia type (None, local, MAC, general) ? IV SEDATION   [name] 07/12/2019, 12:59 PM  _________________________________________________________________   (provider comments below)

## 2019-07-12 NOTE — Telephone Encounter (Signed)
New Message:  The patient wanted to know if the patient would need dental clearance to get dental implants put in. The work will be done by Dr. Frederik Schmidt. He has needed clearance in the past, and he wants to get clearance sooner if needed. He  is having dental work on 01-13.  Please let the patient know if he needs to contact the dentist again , or if the staff from Dr. Theodosia Blender office will contact Dr. Benson Norway for the patient

## 2019-07-12 NOTE — Telephone Encounter (Signed)
Left message for the pt that I called the dental office and I have surgery clearance to our pre op team for review. Once we have cleared him for his procedure we will fax notes to Dr. Benson Norway.

## 2019-07-12 NOTE — Telephone Encounter (Signed)
   Primary Cardiologist: Fransico Him, MD  Chart reviewed as part of pre-operative protocol coverage. Given past medical history and time since last visit, based on ACC/AHA guidelines, [name] would be at acceptable risk for the planned procedure without further cardiovascular testing.   Pt takes Eliquis for afib with CHADS2VASc score of 5 (age x2, CHF, HTN, CAD). SCr 1.37, CrCl 40mL/min. Ok to hold Eliquis for 2 days prior to dental extractions.  I will route this recommendation to the requesting party via Epic fax function and remove from pre-op pool.  Please call with questions.  Daune Perch, NP 07/12/2019, 2:26 PM

## 2019-07-27 NOTE — Telephone Encounter (Signed)
   Called patient and left a detailed voicemail with instructions to hold eliquis 2 days prior to his upcoming dental procedure. Patient instructed to call back if further clarification is needed.  Abigail Butts, PA-C 07/27/19; 3:10 PM

## 2019-07-27 NOTE — Telephone Encounter (Signed)
New Message  Patient is calling in regards to his clearance and any medication changes that need to happen. Please give patient a call back to assist.

## 2019-08-08 ENCOUNTER — Ambulatory Visit: Payer: Medicare Other | Attending: Internal Medicine

## 2019-08-08 DIAGNOSIS — Z23 Encounter for immunization: Secondary | ICD-10-CM | POA: Insufficient documentation

## 2019-08-08 NOTE — Progress Notes (Signed)
   Covid-19 Vaccination Clinic  Name:  DERRY MCCLYMONT    MRN: [id number] DOB: 07/09/1941  08/08/2019  Mr. Kodama was observed post Covid-19 immunization for 15 minutes without incidence. He was provided with Vaccine Information Sheet and instruction to access the V-Safe system.   Mr. Dieleman was instructed to call 911 with any severe reactions post vaccine: Marland Kitchen Difficulty breathing  . Swelling of your face and throat  . A fast heartbeat  . A bad rash all over your body  . Dizziness and weakness    Immunizations Administered    Name Date Dose VIS Date Route   Pfizer COVID-19 Vaccine 08/08/2019 11:09 AM 0.3 mL 06/30/2019 Intramuscular   Manufacturer: [place]   Lot: [id number]   [place]: [id number]

## 2019-08-28 ENCOUNTER — Ambulatory Visit: Payer: Medicare Other | Attending: Internal Medicine

## 2019-08-28 ENCOUNTER — Other Ambulatory Visit: Payer: Self-pay | Admitting: Cardiology

## 2019-08-28 DIAGNOSIS — Z23 Encounter for immunization: Secondary | ICD-10-CM

## 2019-08-28 NOTE — Progress Notes (Signed)
   Covid-19 Vaccination Clinic  Name:  [name]    MRN: [id number] DOB: 1941-01-12  08/28/2019  [name] was observed post Covid-19 immunization for 15 minutes without incidence. He was provided with Vaccine Information Sheet and instruction to access the V-Safe system.   [name] was instructed to call 911 with any severe reactions post vaccine: Marland Kitchen Difficulty breathing  . Swelling of your face and throat  . A fast heartbeat  . A bad rash all over your body  . Dizziness and weakness    Immunizations Administered    Name Date Dose VIS Date Route   Pfizer COVID-19 Vaccine 08/28/2019 11:24 AM 0.3 mL 06/30/2019 Intramuscular   Manufacturer: [place]   Lot: [id number]   [place]: [id number]

## 2019-09-05 ENCOUNTER — Other Ambulatory Visit: Payer: Self-pay | Admitting: Cardiology

## 2019-09-13 NOTE — Telephone Encounter (Signed)
ERROR

## 2019-10-04 ENCOUNTER — Other Ambulatory Visit: Payer: Self-pay | Admitting: Cardiology

## 2019-10-04 MED ORDER — PINDOLOL 5 MG PO TABS: 5.0000 mg | ORAL_TABLET | Freq: Two times a day (BID) | ORAL | 1 refills | Status: DC

## 2019-10-11 ENCOUNTER — Encounter: Payer: Self-pay | Admitting: Cardiology

## 2019-10-11 ENCOUNTER — Ambulatory Visit (INDEPENDENT_AMBULATORY_CARE_PROVIDER_SITE_OTHER): Payer: Medicare Other | Admitting: Cardiology

## 2019-10-11 ENCOUNTER — Other Ambulatory Visit: Payer: Self-pay

## 2019-10-11 VITALS — BP 132/74 | HR 49 | Ht 74.0 in | Wt 154.2 lb

## 2019-10-11 DIAGNOSIS — I712 Thoracic aortic aneurysm, without rupture, unspecified: Secondary | ICD-10-CM

## 2019-10-11 DIAGNOSIS — I2510 Atherosclerotic heart disease of native coronary artery without angina pectoris: Secondary | ICD-10-CM

## 2019-10-11 DIAGNOSIS — I4819 Other persistent atrial fibrillation: Secondary | ICD-10-CM | POA: Diagnosis not present

## 2019-10-11 DIAGNOSIS — I10 Essential (primary) hypertension: Secondary | ICD-10-CM

## 2019-10-11 DIAGNOSIS — I5032 Chronic diastolic (congestive) heart failure: Secondary | ICD-10-CM | POA: Diagnosis not present

## 2019-10-11 DIAGNOSIS — I77810 Thoracic aortic ectasia: Secondary | ICD-10-CM

## 2019-10-11 DIAGNOSIS — I495 Sick sinus syndrome: Secondary | ICD-10-CM

## 2019-10-11 DIAGNOSIS — E785 Hyperlipidemia, unspecified: Secondary | ICD-10-CM

## 2019-10-11 NOTE — Progress Notes (Signed)
Cardiology Office Note:    Date:  10/11/2019   ID:  [name], DOB [birth year], MRN [id number]  PCP:  Jesse Carol, MD  Cardiologist:  Jesse Him, MD    Referring MD: Jesse Carol, MD   Chief Complaint  Patient presents with  . Coronary Artery Disease  . Hypertension  . Atrial Fibrillation    History of Present Illness:    [name] is a 79 y.o. 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    He is here today for followup and is doing well.  He denies any chest pain or pressure, SOB, DOE, PND, orthopnea, LE edema, dizziness, palpitations or syncope. he is compliant with his meds and is tolerating meds with no SE.    Past Medical History:  Diagnosis Date  . Ascending aorta dilatation (HCC)    25mm by chest CT angio 04/2018  . CAD (coronary artery disease)    a. 05/2006 Abnl stress test w/ 2-44mm ST dep; b. 05/2006 Cath/PCI: LM nl, LAD 30-40ost, 20-30p, 90d (small), D1 nl, D2 nl, LCX nl, OM1/2 nl, RCA Ca2+, 81m/d (3.5x16 [place] BMS & 3.5x12 Liberty BMS), EF 50%.  . Chronic diastolic CHF (congestive heart failure) ([place])    a. 10/2017 Echo: EF 50-55%, mild AI, sev dil LA, mod dil RA.  Marland Kitchen Chronic venous insufficiency   . HTN (hypertension)   . PAF (paroxysmal atrial fibrillation) ([place])    a. CHA2DS2VASc 5-->Eliquis; b. Prev WCT on flecainide;  c. 12/2017 s/p DCCV.  Marland Kitchen Pneumothorax   . Ventricular tachycardia (HCC)    a. possibly proarrhythmia from flecainide.    Past Surgical History:  Procedure Laterality Date  . CARDIOVERSION N/A 12/23/2017   Procedure: CARDIOVERSION;  Surgeon: Jesse Margarita, MD;  Location: [facility] ENDOSCOPY;  Service: Cardiovascular;  Laterality: N/A;  . CARDIOVERSION N/A 03/08/2018   Procedure: CARDIOVERSION;   Surgeon: Jesse Margarita, MD;  Location: [facility] ENDOSCOPY;  Service: Cardiovascular;  Laterality: N/A;  . INGUINAL HERNIA REPAIR     x2  . TEE WITHOUT CARDIOVERSION N/A 03/08/2018   Procedure: TRANSESOPHAGEAL ECHOCARDIOGRAM (TEE);  Surgeon: Jesse Margarita, MD;  Location: [facility] ENDOSCOPY;  Service: Cardiovascular;  Laterality: N/A;  . TOTAL KNEE ARTHROPLASTY Left 02/28/2018   Procedure: LEFT TOTAL KNEE ARTHROPLASTY;  Surgeon: Jesse Arabian, MD;  Location: [facility] ORS;  Service: Orthopedics;  Laterality: Left;    Current Medications: Current Meds  Medication Sig  . amiodarone (PACERONE) 200 MG tablet Take 1 tablet (200 mg total) by mouth daily.  . Ascorbic Acid (VITAMIN C PO) Take 1 tablet by mouth daily.   Marland Kitchen atorvastatin (LIPITOR) 20 MG tablet TAKE 1 TABLET BY MOUTH EVERY DAY  . Cholecalciferol (VITAMIN D3) 2000 units TABS Take 2,000 Units by mouth daily.  Marland Kitchen ELIQUIS 5 MG TABS tablet TAKE 1 TABLET BY MOUTH TWICE A DAY  . ferrous sulfate 325 (65 FE) MG tablet Take 1 tablet (325 mg total) by mouth daily.  . fish oil-omega-3 fatty acids 1000 MG capsule Take 2 g by mouth daily.    . furosemide (LASIX) 20 MG tablet Take 1 tablet (20 mg total) by mouth daily.  Marland Kitchen glucosamine-chondroitin 500-400 MG tablet Take 1 tablet by mouth 2 (two) times daily.   . Multiple Vitamin (MULTIVITAMIN) tablet Take 1 tablet by  mouth daily.    . nitroGLYCERIN (NITROSTAT) 0.4 MG SL tablet Place 1 tablet (0.4 mg total) under the tongue every 5 (five) minutes as needed for chest pain.  . pindolol (VISKEN) 5 MG tablet Take 1 tablet (5 mg total) by mouth 2 (two) times daily.  . vitamin E 400 UNIT capsule Take 400 Units by mouth daily.     Allergies:   Patient has no known allergies.   Social History   Socioeconomic History  . Marital status: Widowed    Spouse name: Not on file  . Number of children: Not on file  . Years of education: Not on file  . Highest education level: Not on file  Occupational History  . Occupation:  retired  Tobacco Use  . Smoking status: Never Smoker  . Smokeless tobacco: Never Used  Substance and Sexual Activity  . Alcohol use: No    Comment: occsaoinal  . Drug use: Yes    Types: Other-see comments  . Sexual activity: Not Currently  Other Topics Concern  . Not on file  Social History Narrative   Lives in [place] by himself but 2 dtrs nearby and help out when necessary.   Social Determinants of Health   Financial Resource Strain:   . Difficulty of Paying Living Expenses:   Food Insecurity:   . Worried About Charity fundraiser in the Last Year:   . Arboriculturist in the Last Year:   Transportation Needs:   . Film/video editor (Medical):   Marland Kitchen Lack of Transportation (Non-Medical):   Physical Activity:   . Days of Exercise per Week:   . Minutes of Exercise per Session:   Stress:   . Feeling of Stress :   Social Connections:   . Frequency of Communication with Friends and Family:   . Frequency of Social Gatherings with Friends and Family:   . Attends Religious Services:   . Active Member of Clubs or Organizations:   . Attends Archivist Meetings:   Marland Kitchen Marital Status:      Family History: The patient's family history includes Coronary artery disease in an other family member.  ROS:   Please see the history of present illness.    ROS  All other systems reviewed and negative.   EKGs/Labs/Other Studies Reviewed:    The following studies were reviewed today: EKG and outpt labs from PCP on [facility]  EKG:  EKG is  ordered today.  The ekg ordered today demonstrates sinus bradycardia at 46bpm with no ST changes  Recent Labs: 04/14/2019: ALT 20; BUN 16; Creatinine, Ser 1.37; Hemoglobin 8.8; Platelets 237; Potassium 4.1; Sodium 140   Recent Lipid Panel    Component Value Date/Time   CHOL 118 04/13/2019 0954   TRIG 33 04/13/2019 0954   HDL 58 04/13/2019 0954   CHOLHDL 2.0 04/13/2019 0954   CHOLHDL 1.9 06/19/2016 0809   VLDL 7 06/19/2016 0809   LDLCALC 51  04/13/2019 0954    Physical Exam:    VS:  BP 132/74   Pulse (!) 49   Ht 6\' 2"  (1.88 m)   Wt 154 lb 3.2 oz (69.9 kg)   SpO2 95%   BMI 19.80 kg/m     Wt Readings from Last 3 Encounters:  10/11/19 154 lb 3.2 oz (69.9 kg)  04/13/19 158 lb 3.2 oz (71.8 kg)  02/23/19 157 lb 3.2 oz (71.3 kg)     GEN:  Well nourished, well developed in no acute distress  HEENT: Normal NECK: No JVD; No carotid bruits LYMPHATICS: No lymphadenopathy CARDIAC: RRR, no murmurs, rubs, gallops RESPIRATORY:  Clear to auscultation without rales, wheezing or rhonchi  ABDOMEN: Soft, non-tender, non-distended MUSCULOSKELETAL:  No edema; No deformity  SKIN: Warm and dry NEUROLOGIC:  Alert and oriented x 3 PSYCHIATRIC:  Normal affect   ASSESSMENT:    1. Coronary artery disease involving native coronary artery of native heart without angina pectoris   2. HYPERTENSION, BENIGN   3. Chronic diastolic CHF (congestive heart failure) (HCC)   4. Persistent atrial fibrillation ([place])   5. Ascending aorta dilatation (HCC)   6. Dyslipidemia   7. Tachycardia-bradycardia syndrome ([place])   8. Thoracic aortic aneurysm without rupture ([place])    PLAN:    In order of problems listed above:  1.  ASCAD -s/p BMS to RCA -no anginal sx since last OV -continue BB and statin -not on ASA due to DOAC  2.  HTN -Bp is well controlled -continue Pindolol 5mg  BID  3.  Chronic diastolic CHF -he appears euvolemic on exam -continue lasix 40mg  daily -creatinine remains stable at 1.37 and K+ 4.1 in Sept 2020 -repeat BMET today  4.  Persistent atrial fibrillation -remains in sinus brady on exam today -continue Amio 200mg  daily and Pindolol to 5mg  BID  -he has chronic bradycardia but had PAF with RVR on event monitor so will keep on current dose of Amio and BB since he is completely asymptomatic from his bradycardia and from his afib -no bleeding issues on DOAC -continue apixaban 5mg  BID -check CMET, CBC and TSH -Check PFTs  with DLCO  5.  Ascending aortic aneurysm -chest CTA showed stable 4.4cm aneurysm -repeat Chest CTA for stability 04/2020 -continue statin  -BP well controlled -reminded not to do any upper body weight lifting  6.  HLD -LDL goal < 70 -continue atorvastatin 20mg  daily -LDL at goal at 51 in 03/2019  7.  Tachy/Brady syndrome -his HR is 46bpm today  -he wore a 24 hour Holter showing episodes of PAF up to 164bpm with average HR in sinus at 54bpm.   -he was referred to afib clinic but declined -he is completely asymptomatic with no palpitations and no dizziness (unless pushing his mower up a steep hill) and is able to workout on the bike at the gym for over 30 minutes without any problems. Therefore will not change meds at this time and no PPM indicated  8.  Right knee pain -he is seeing Dr. Maureen Ralphs and will like need TKR in the near future -he is able to achieve well over 4.5 mets of activity with no angina.  His revised cardiac index score is 2 c/w at 6.6% risk of periop major cardiac event and acceptable risk for this surgery and ok to proceed.  He will let our office know when it is scheduled to give instructions on holding his Eliquis.   Medication Adjustments/Labs and Tests Ordered: Current medicines are reviewed at length with the patient today.  Concerns regarding medicines are outlined above.  Orders Placed This Encounter  Procedures  . CT ANGIO CHEST AORTA W/CM & OR WO/CM  . CBC  . Comprehensive metabolic panel  . Lipid panel  . TSH  . EKG 12-Lead  . Pulmonary function test   No orders of the defined types were placed in this encounter.   Signed, Jesse Him, MD  10/11/2019 2:14 PM    [place]

## 2019-10-11 NOTE — Patient Instructions (Addendum)
Medication Instructions:  Your physician recommends that you continue on your current medications as directed. Please refer to the Current Medication list given to you today.  *If you need a refill on your cardiac medications before your next appointment, please call your pharmacy*   Lab Work: CBC, CMET, FLP, TSH  If you have labs (blood work) drawn today and your tests are completely normal, you will receive your results only by: Marland Kitchen MyChart Message (if you have MyChart) OR . A paper copy in the mail If you have any lab test that is abnormal or we need to change your treatment, we will call you to review the results.   Testing/Procedures: Your physician has recommended that you have a pulmonary function test. Pulmonary Function Tests are a group of tests that measure how well air moves in and out of your lungs.  Your physician has recommended that you have a Chest CTA done in October 2021. Chest CT Angiography (CTA), is a special type of CT scan that uses a computer to produce multi-dimensional views of major blood vessels throughout the body. In CT angiography, a contrast material is injected through an IV to help visualize the blood vessels  Follow-Up: At [facility], you and your health needs are our priority.  As part of our continuing mission to provide you with exceptional heart care, we have created designated Provider Care Teams.  These Care Teams include your primary Cardiologist (physician) and Advanced Practice Providers (APPs -  Physician Assistants and Nurse Practitioners) who all work together to provide you with the care you need, when you need it.    We recommend signing up for the patient portal called "MyChart".  Sign up information is provided on this After Visit Summary.  MyChart is used to connect with patients for Virtual Visits (Telemedicine).  Patients are able to view lab/test results, encounter notes, upcoming appointments, etc.  Non-urgent messages can be sent to  your provider as well.   To learn more about what you can do with MyChart, go to [email or web address].    Your next appointment:   6 month(s)  The format for your next appointment:   In Person  Provider:   Fransico Him, MD

## 2019-10-19 ENCOUNTER — Other Ambulatory Visit: Payer: Self-pay | Admitting: Cardiology

## 2019-10-19 NOTE — Telephone Encounter (Signed)
Pt last saw Dr Radford Pax 10/11/19, last labs 04/14/19 Creat 1.37, age 79, weight 69.9kg, based on specified criteria pt is on appropriate dosage of Eliquis 5mg  BID.  Will refill rx.

## 2019-10-21 ENCOUNTER — Other Ambulatory Visit: Payer: Self-pay | Admitting: Cardiology

## 2019-10-24 ENCOUNTER — Telehealth: Payer: Self-pay | Admitting: Cardiology

## 2019-10-24 MED ORDER — PINDOLOL 10 MG PO TABS: 5.0000 mg | ORAL_TABLET | Freq: Two times a day (BID) | ORAL | 3 refills | Status: DC

## 2019-10-24 NOTE — Telephone Encounter (Signed)
I spoke with the patient who states that he went to pick up his prescription for pindolol yesterday and it had changed. He used to be receiving 10 mg tablets and taking 0.5 tablet (5 mg) twice per day. This new prescription was sent in as 5 mg tablets to take twice per day. He states that it is significantly more expensive for the 5 mg tablets than the 10 mg tablets. I spoke with his pharmacy and they confirmed that it was more expensive for some reason. I have sent in a new prescription for 10 mg tablets for him to take 0.5 tablet BID.

## 2019-10-24 NOTE — Telephone Encounter (Signed)
New message   Patient has a question about the dosage for pindolol (VISKEN) 5 MG tablet. Please call to discuss.

## 2019-10-25 ENCOUNTER — Other Ambulatory Visit: Payer: Medicare Other | Admitting: *Deleted

## 2019-10-25 ENCOUNTER — Other Ambulatory Visit: Payer: Self-pay

## 2019-10-25 DIAGNOSIS — I4819 Other persistent atrial fibrillation: Secondary | ICD-10-CM

## 2019-10-25 DIAGNOSIS — E785 Hyperlipidemia, unspecified: Secondary | ICD-10-CM

## 2019-10-25 LAB — COMPREHENSIVE METABOLIC PANEL
ALT: 14 IU/L (ref 0–44)
AST: 21 IU/L (ref 0–40)
Albumin/Globulin Ratio: 2.3 — ABNORMAL HIGH (ref 1.2–2.2)
Albumin: 4.3 g/dL (ref 3.7–4.7)
Alkaline Phosphatase: 92 IU/L (ref 39–117)
BUN/Creatinine Ratio: 10 (ref 10–24)
BUN: 14 mg/dL (ref 8–27)
Bilirubin Total: 0.3 mg/dL (ref 0.0–1.2)
CO2: 25 mmol/L (ref 20–29)
Calcium: 9.1 mg/dL (ref 8.6–10.2)
Chloride: 101 mmol/L (ref 96–106)
Creatinine, Ser: 1.34 mg/dL — ABNORMAL HIGH (ref 0.76–1.27)
GFR calc Af Amer: 57 mL/min/{1.73_m2} — ABNORMAL LOW (ref >59)
GFR calc non Af Amer: 50 mL/min/{1.73_m2} — ABNORMAL LOW (ref >59)
Globulin, Total: 1.9 g/dL (ref 1.5–4.5)
Glucose: 104 mg/dL — ABNORMAL HIGH (ref 65–99)
Potassium: 4.5 mmol/L (ref 3.5–5.2)
Sodium: 140 mmol/L (ref 134–144)
Total Protein: 6.2 g/dL (ref 6.0–8.5)

## 2019-10-25 LAB — CBC
Hematocrit: 34.6 % — ABNORMAL LOW (ref 37.5–51.0)
Hemoglobin: 11 g/dL — ABNORMAL LOW (ref 13.0–17.7)
MCH: 24.4 pg — ABNORMAL LOW (ref 26.6–33.0)
MCHC: 31.8 g/dL (ref 31.5–35.7)
MCV: 77 fL — ABNORMAL LOW (ref 79–97)
Platelets: 243 10*3/uL (ref 150–450)
RBC: 4.51 x10E6/uL (ref 4.14–5.80)
RDW: 16.2 % — ABNORMAL HIGH (ref 11.6–15.4)
WBC: 12.6 10*3/uL — ABNORMAL HIGH (ref 3.4–10.8)

## 2019-10-25 LAB — TSH: TSH: 1.2 u[IU]/mL (ref 0.450–4.500)

## 2019-10-25 LAB — LIPID PANEL
Chol/HDL Ratio: 2.3 ratio (ref 0.0–5.0)
Cholesterol, Total: 126 mg/dL (ref 100–199)
HDL: 56 mg/dL (ref >39)
LDL Chol Calc (NIH): 60 mg/dL (ref 0–99)
Triglycerides: 40 mg/dL (ref 0–149)
VLDL Cholesterol Cal: 10 mg/dL (ref 5–40)

## 2019-11-10 ENCOUNTER — Telehealth: Payer: Self-pay | Admitting: Cardiology

## 2019-11-10 ENCOUNTER — Telehealth: Payer: Self-pay | Admitting: *Deleted

## 2019-11-10 ENCOUNTER — Other Ambulatory Visit: Payer: Self-pay

## 2019-11-10 ENCOUNTER — Emergency Department (HOSPITAL_COMMUNITY)
Admission: EM | Admit: 2019-11-10 | Discharge: 2019-11-11 | Disposition: A | Discharge status: Home / Self Care | Payer: Medicare Other | Attending: Emergency Medicine | Admitting: Emergency Medicine

## 2019-11-10 DIAGNOSIS — I83892 Varicose veins of left lower extremities with other complications: Secondary | ICD-10-CM

## 2019-11-10 DIAGNOSIS — Z79899 Other long term (current) drug therapy: Secondary | ICD-10-CM | POA: Insufficient documentation

## 2019-11-10 DIAGNOSIS — I4891 Unspecified atrial fibrillation: Secondary | ICD-10-CM | POA: Diagnosis not present

## 2019-11-10 DIAGNOSIS — I5032 Chronic diastolic (congestive) heart failure: Secondary | ICD-10-CM | POA: Insufficient documentation

## 2019-11-10 DIAGNOSIS — I110 Hypertensive heart disease with heart failure: Secondary | ICD-10-CM | POA: Insufficient documentation

## 2019-11-10 DIAGNOSIS — I2510 Atherosclerotic heart disease of native coronary artery without angina pectoris: Secondary | ICD-10-CM | POA: Diagnosis not present

## 2019-11-10 NOTE — ED Notes (Signed)
Pt reports taking Eliquis daily.

## 2019-11-10 NOTE — Telephone Encounter (Signed)
Left message for patient

## 2019-11-10 NOTE — Telephone Encounter (Signed)
Daughter called stating patient had another varicose bleed.  She was able to stop the bleeding with compression and elevation. An appointment was made for office visit recall and to discuss sclerotherapy.

## 2019-11-10 NOTE — ED Triage Notes (Signed)
Pt arrives to room via [facility] stretcher. 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 Pt was found in kitchen with feet elevated. EMS reports bleeding had stopped when they arrived. EBL 400 mL. Pt reports bleeding started this morning and went to cardiologist Golden Hurter) with bandage on leg. No bleeding throughout day. Pt received 500 mL NS enroute. VSS. Pt A+Ox4 on arrival

## 2019-11-10 NOTE — Telephone Encounter (Signed)
Pt c/o medication issue:  1. Name of Medication:   2. How are you currently taking this medication (dosage and times per day)?   3. Are you having a reaction (difficulty breathing--STAT)? Yes 4. What is your medication issue? Patient states he assumes one of his medications may be causing him to experience some vein bleeding. However, he is not sure which medication may be causing this issue. Please advise.

## 2019-11-10 NOTE — Telephone Encounter (Signed)
Patient states that he had another vein bleed the other day. He was able to control it with elevation and compression. He is concerned that his Eliquis was causing the bleeding and he wants to know if he can reduce his dose. I advised him to continue his current dose of Eliquis and follow up with the vein and vascular specialist. He states that he has an appointment with them 05/06 to discuss sclerotherapy.

## 2019-11-10 NOTE — Telephone Encounter (Signed)
Agree with plan 

## 2019-11-11 ENCOUNTER — Other Ambulatory Visit: Payer: Self-pay | Admitting: Nurse Practitioner

## 2019-11-11 ENCOUNTER — Telehealth: Payer: Self-pay | Admitting: Nurse Practitioner

## 2019-11-11 DIAGNOSIS — D62 Acute posthemorrhagic anemia: Secondary | ICD-10-CM

## 2019-11-11 DIAGNOSIS — I83892 Varicose veins of left lower extremities with other complications: Secondary | ICD-10-CM | POA: Diagnosis not present

## 2019-11-11 LAB — CBC
HCT: 28 % — ABNORMAL LOW (ref 39.0–52.0)
Hemoglobin: 8.4 g/dL — ABNORMAL LOW (ref 13.0–17.0)
MCH: 24.1 pg — ABNORMAL LOW (ref 26.0–34.0)
MCHC: 30 g/dL (ref 30.0–36.0)
MCV: 80.2 fL (ref 80.0–100.0)
Platelets: 216 10*3/uL (ref 150–400)
RBC: 3.49 MIL/uL — ABNORMAL LOW (ref 4.22–5.81)
RDW: 17.5 % — ABNORMAL HIGH (ref 11.5–15.5)
WBC: 11.1 10*3/uL — ABNORMAL HIGH (ref 4.0–10.5)
nRBC: 0 % (ref 0.0–0.2)

## 2019-11-11 LAB — HEMOGLOBIN AND HEMATOCRIT, BLOOD
HCT: 27.3 % — ABNORMAL LOW (ref 39.0–52.0)
Hemoglobin: 8.3 g/dL — ABNORMAL LOW (ref 13.0–17.0)

## 2019-11-11 NOTE — Telephone Encounter (Signed)
   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  He is on eliquis and was seen in the ED.  H/H dropped from 11/34.6 on 4/7 to 8.4/28 early this AM.  F/u lab @ 5:41 AM today  8.3/27.3.  Per notes, hemostasis was achieved w/ manual pressure.  He was d/c'd home and advised to have a f/u CBC next week.  He is currently off of eliquis.  CHA2DS2VASc = 5.  No prior h/o stroke.  I advised to remain off of eliquis at least until we see f/u CBC this coming Wednesday (I placed order for him to have drawn @ our [facility] office).  If H/H stable @ that point, will resume.  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  Caller verbalized understanding and was grateful for the call back.  Murray Hodgkins, NP 11/11/2019, 8:36 AM

## 2019-11-11 NOTE — ED Provider Notes (Signed)
[facility] EMERGENCY DEPARTMENT Provider Note   CSN: [id number] Arrival date & time: 11/10/19  2345     History Chief Complaint  Patient presents with  . Coagulation Disorder    [name] is a 79 y.o. male.  Patient presents to the emergency department for evaluation of bleeding from left lower leg/foot area.  Patient reports that he has varicose veins and has had multiple episodes of bleeding from small veins in the past.  Patient had spontaneous bleeding tonight.  He reports moderate amount of bleeding but it has now stopped with direct pressure.  No chest pains, tachycardia, weakness, dizziness, passing out.        Past Medical History:  Diagnosis Date  . Ascending aorta dilatation (HCC)    62mm by chest CT angio 04/2018  . CAD (coronary artery disease)    a. 05/2006 Abnl stress test w/ 2-22mm ST dep; b. 05/2006 Cath/PCI: LM nl, LAD 30-40ost, 20-30p, 90d (small), D1 nl, D2 nl, LCX nl, OM1/2 nl, RCA Ca2+, 78m/d (3.5x16 Liberty BMS & 3.5x12 Liberty BMS), EF 50%.  . Chronic diastolic CHF (congestive heart failure) ([place])    a. 10/2017 Echo: EF 50-55%, mild AI, sev dil LA, mod dil RA.  Marland Kitchen Chronic venous insufficiency   . HTN (hypertension)   . PAF (paroxysmal atrial fibrillation) ([place])    a. CHA2DS2VASc 5-->Eliquis; b. Prev WCT on flecainide;  c. 12/2017 s/p DCCV.  Marland Kitchen Pneumothorax   . Ventricular tachycardia (HCC)    a. possibly proarrhythmia from flecainide.    Patient Active Problem List   Diagnosis Date Noted  . Bilateral lower extremity edema 05/24/2018  . Persistent atrial fibrillation ([place]) 03/16/2018  . Ascending aorta dilatation (HCC)   . OA (osteoarthritis) of knee 02/28/2018  . Chronic diastolic CHF (congestive heart failure) ([place])   . CAD (coronary artery disease) 06/26/2013  . Ventricular tachycardia ([place])   . Chronic venous insufficiency   . Dyslipidemia 06/08/2011  . 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 HYPERTENSION, BENIGN 05/09/2009     Past Surgical History:  Procedure Laterality Date  . CARDIOVERSION N/A 12/23/2017   Procedure: CARDIOVERSION;  Surgeon: Sueanne Margarita, MD;  Location: [facility] ENDOSCOPY;  Service: Cardiovascular;  Laterality: N/A;  . CARDIOVERSION N/A 03/08/2018   Procedure: CARDIOVERSION;  Surgeon: Sueanne Margarita, MD;  Location: [facility] ENDOSCOPY;  Service: Cardiovascular;  Laterality: N/A;  . INGUINAL HERNIA REPAIR     x2  . TEE WITHOUT CARDIOVERSION N/A 03/08/2018   Procedure: TRANSESOPHAGEAL ECHOCARDIOGRAM (TEE);  Surgeon: Sueanne Margarita, MD;  Location: [facility] ENDOSCOPY;  Service: Cardiovascular;  Laterality: N/A;  . TOTAL KNEE ARTHROPLASTY Left 02/28/2018   Procedure: LEFT TOTAL KNEE ARTHROPLASTY;  Surgeon: Gaynelle Arabian, MD;  Location: [facility] ORS;  Service: Orthopedics;  Laterality: Left;       Family History  Problem Relation Age of Onset  . Coronary artery disease Other     Social History   Tobacco Use  . Smoking status: Never Smoker  . Smokeless tobacco: Never Used  Substance Use Topics  . Alcohol use: No    Comment: occsaoinal  . Drug use: Yes    Types: Other-see comments    Home Medications Prior to Admission medications   Medication Sig Start Date End Date Taking? Authorizing Provider  acetaminophen (TYLENOL) 500 MG tablet Take 500 mg by mouth 2 (two) times daily as needed for mild pain.   Yes [provider]  amiodarone (PACERONE) 200 MG tablet Take 1 tablet (200 mg  total) by mouth daily. 08/28/19  Yes Turner, Eber Hong, MD  Ascorbic Acid (VITAMIN C PO) Take 1 tablet by mouth daily.    Yes [provider]  atorvastatin (LIPITOR) 20 MG tablet TAKE 1 TABLET BY MOUTH EVERY DAY Patient taking differently: Take 20 mg by mouth daily.  09/05/19  Yes Turner, Eber Hong, MD  Cholecalciferol (VITAMIN D3) 2000 units TABS Take 2,000 Units by mouth daily.   Yes [provider]  ELIQUIS 5 MG TABS tablet TAKE 1 TABLET BY MOUTH TWICE A DAY Patient taking differently: Take 5 mg by mouth 2  (two) times daily.  10/19/19  Yes Turner, Eber Hong, MD  fish oil-omega-3 fatty acids 1000 MG capsule Take 2 g by mouth daily.     Yes [provider]  furosemide (LASIX) 20 MG tablet Take 1 tablet (20 mg total) by mouth daily. 03/30/19 03/29/20 Yes Turner, Eber Hong, MD  glucosamine-chondroitin 500-400 MG tablet Take 1 tablet by mouth 2 (two) times daily.    Yes [provider]  Multiple Vitamin (MULTIVITAMIN) tablet Take 1 tablet by mouth daily.     Yes [provider]  pindolol (VISKEN) 10 MG tablet Take 0.5 tablets (5 mg total) by mouth 2 (two) times daily. 10/24/19  Yes Turner, Eber Hong, MD  vitamin E 400 UNIT capsule Take 400 Units by mouth daily.   Yes [provider]  ferrous sulfate 325 (65 FE) MG tablet Take 1 tablet (325 mg total) by mouth daily. Patient not taking: Reported on 11/11/2019 04/14/19   Drenda Freeze, MD  nitroGLYCERIN (NITROSTAT) 0.4 MG SL tablet Place 1 tablet (0.4 mg total) under the tongue every 5 (five) minutes as needed for chest pain. 10/18/17   Sueanne Margarita, MD    Allergies    Patient has no known allergies.  Review of Systems   Review of Systems  Skin: Positive for wound.  All other systems reviewed and are negative.   Physical Exam Updated Vital Signs BP 134/69   Pulse (!) 46   Temp 97.9 F (36.6 C) (Oral)   Resp 14   Ht 6\' 2"  (1.88 m)   Wt 69.9 kg   SpO2 95%   BMI 19.77 kg/m   Physical Exam Vitals and nursing note reviewed.  Constitutional:      General: He is not in acute distress.    Appearance: Normal appearance. He is well-developed.  HENT:     Head: Normocephalic and atraumatic.     Right Ear: Hearing normal.     Left Ear: Hearing normal.     Nose: Nose normal.  Eyes:     Conjunctiva/sclera: Conjunctivae normal.     Pupils: Pupils are equal, round, and reactive to light.  Cardiovascular:     Rate and Rhythm: Regular rhythm.     Heart sounds: S1 normal and S2 normal. No murmur. No friction rub. No  gallop.   Pulmonary:     Effort: Pulmonary effort is normal. No respiratory distress.     Breath sounds: Normal breath sounds.  Chest:     Chest wall: No tenderness.  Abdominal:     General: Bowel sounds are normal.     Palpations: Abdomen is soft.     Tenderness: There is no abdominal tenderness. There is no guarding or rebound. Negative signs include Murphy's sign and McBurney's sign.     Hernia: No hernia is present.  Musculoskeletal:        General: Normal range of  motion.     Cervical back: Normal range of motion and neck supple.     Comments: Numerous superficial varicosities bilateral lower extremities  Tiny puncture wound over varicosity left lower leg, no active bleeding  Skin:    General: Skin is warm and dry.     Findings: No rash.  Neurological:     Mental Status: He is alert and oriented to person, place, and time.     GCS: GCS eye subscore is 4. GCS verbal subscore is 5. GCS motor subscore is 6.     Cranial Nerves: No cranial nerve deficit.     Sensory: No sensory deficit.     Coordination: Coordination normal.  Psychiatric:        Speech: Speech normal.        Behavior: Behavior normal.        Thought Content: Thought content normal.     ED Results / Procedures / Treatments   Labs (all labs ordered are listed, but only abnormal results are displayed) Labs Reviewed  CBC - Abnormal; Notable for the following components:      Result Value   WBC 11.1 (*)    RBC 3.49 (*)    Hemoglobin 8.4 (*)    HCT 28.0 (*)    MCH 24.1 (*)    RDW 17.5 (*)    All other components within normal limits  HEMOGLOBIN AND HEMATOCRIT, BLOOD - Abnormal; Notable for the following components:   Hemoglobin 8.3 (*)    HCT 27.3 (*)    All other components within normal limits    EKG None  Radiology No results found.  Procedures Procedures (including critical care time)  Medications Ordered in ED Medications - No data to display  ED Course  I have reviewed the triage vital  signs and the nursing notes.  Pertinent labs & imaging results that were available during my care of the patient were reviewed by me and considered in my medical decision making (see chart for details).    MDM Rules/Calculators/A&P                      Patient presents to the emergency department for bleeding from his left foot.  Patient ruptured a varicose vein and had bleeding prior to arrival.  At arrival the bleeding has stopped with direct pressure.  Patient was monitored overnight.  No further bleeding.  Vital signs have been normal.  Hemoglobin was down from his baseline, although he has had hemoglobins in the eights before.  A second hemoglobin was obtained and it is stable.  Patient will therefore be discharged, follow-up with his doctor for repeat hemoglobin in a week.  Return to the ER for any further bleeding.  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

## 2019-11-11 NOTE — ED Notes (Signed)
Pt requested urinary catheter as he normally self caths. Pt given one and he ambulated unassisted to bathroom to self cath.

## 2019-11-11 NOTE — ED Notes (Signed)
Pt verbalized understanding of d/c instructions, follow up care and s.s requiring return to work. Pt had no further questions at this time and was transported to exit via wheelchair

## 2019-11-15 ENCOUNTER — Other Ambulatory Visit: Payer: Self-pay | Admitting: *Deleted

## 2019-11-15 ENCOUNTER — Other Ambulatory Visit: Payer: Medicare Other | Admitting: *Deleted

## 2019-11-15 ENCOUNTER — Other Ambulatory Visit: Payer: Self-pay

## 2019-11-15 DIAGNOSIS — D62 Acute posthemorrhagic anemia: Secondary | ICD-10-CM

## 2019-11-15 LAB — CBC
Hematocrit: 28.1 % — ABNORMAL LOW (ref 37.5–51.0)
Hemoglobin: 8.8 g/dL — ABNORMAL LOW (ref 13.0–17.7)
MCH: 24.6 pg — ABNORMAL LOW (ref 26.6–33.0)
MCHC: 31.3 g/dL — ABNORMAL LOW (ref 31.5–35.7)
MCV: 79 fL (ref 79–97)
Platelets: 250 10*3/uL (ref 150–450)
RBC: 3.57 x10E6/uL — ABNORMAL LOW (ref 4.14–5.80)
RDW: 15.9 % — ABNORMAL HIGH (ref 11.6–15.4)
WBC: 6.8 10*3/uL (ref 3.4–10.8)

## 2019-11-16 ENCOUNTER — Ambulatory Visit (INDEPENDENT_AMBULATORY_CARE_PROVIDER_SITE_OTHER): Payer: Medicare Other | Admitting: Physician Assistant

## 2019-11-16 ENCOUNTER — Ambulatory Visit: Payer: Medicare Other

## 2019-11-16 ENCOUNTER — Telehealth: Payer: Self-pay | Admitting: Cardiology

## 2019-11-16 ENCOUNTER — Encounter: Payer: Self-pay | Admitting: Physician Assistant

## 2019-11-16 VITALS — BP 128/62 | HR 60 | Temp 97.0°F | Resp 16 | Ht 74.0 in | Wt 149.5 lb

## 2019-11-16 DIAGNOSIS — I83892 Varicose veins of left lower extremities with other complications: Secondary | ICD-10-CM | POA: Diagnosis not present

## 2019-11-16 DIAGNOSIS — I872 Venous insufficiency (chronic) (peripheral): Secondary | ICD-10-CM | POA: Diagnosis not present

## 2019-11-16 DIAGNOSIS — I2510 Atherosclerotic heart disease of native coronary artery without angina pectoris: Secondary | ICD-10-CM

## 2019-11-16 NOTE — Telephone Encounter (Signed)
Attempted to call daughter back with no answer.  Attempted to call the patient and left message for him to call back.

## 2019-11-16 NOTE — Telephone Encounter (Signed)
Follow Up  Patient returning call. Please give patient a call back.  

## 2019-11-16 NOTE — Telephone Encounter (Signed)
Patient's daughter is requesting to speak with Dr. Theodosia Blender nurse. She has a couple questions regarding when her father should start back on Eliquis and also about an upcoming procedure on Tuesday 11/21/19 on his varicose veins. Please advise.

## 2019-11-16 NOTE — Progress Notes (Signed)
Office Note     CC:  follow up Requesting Provider:  Seward Carol, MD  HPI: [name] is a 79 y.o. (Aug 29, 1940) male who presents for follow up of bleeding varicose veins of the left leg. He has history of multiple bleeding events some handled at home and he has previously been to the ED in the past.  He was last seen in August 2020 by Dr. Scot Dock. Non invasive studies at the time showed deep reflux as well as superficial reflux however very small veins. Conservative therapy including compression stockings, elevation, avoidance of prolonged sitting or standing, exercise, and topical treatment of lower extremities was suggested at that time.  He presents today with recurrent bleeding from his superficial varicosities of his left leg. Most recently he had bleeding from the left lateral leg on 11/10/19. He lives by himself so he called 911 and was taken to the ED. He states that he had just gotten out of the shower when his leg started bleeding. He says it was a moderate amount of blood and he applied direct pressure. By time of arrival at the ED his bleeding was stopped. He was monitored overnight and had no further bleeding. His Hgb did drop from 11 to in the 8's. He remained hemodynamically stable and was discharged with instructions to keep compression on the area. He has been wrapping left leg with coban wrap daily. He has been off of his Eliquis since 4/23. He had Hgb drawn at Cardiologist yesterday and it remains stable at 8.8.  He does not have any symptoms of claudication, rest pain or non healing wounds  The pt is on a statin for cholesterol management.  The pt is not on a daily aspirin.   Other AC: Eliquis for atrial fibrillation The pt is on BB and lasix for hypertension.   The pt is not diabetic.  Tobacco hx:  Never  Past Medical History:  Diagnosis Date  . Ascending aorta dilatation (HCC)    31mm by chest CT angio 04/2018  . CAD (coronary artery disease)    a. 05/2006  Abnl stress test w/ 2-84mm ST dep; b. 05/2006 Cath/PCI: LM nl, LAD 30-40ost, 20-30p, 90d (small), D1 nl, D2 nl, LCX nl, OM1/2 nl, RCA Ca2+, 8m/d (3.5x16 Liberty BMS & 3.5x12 Liberty BMS), EF 50%.  . Chronic diastolic CHF (congestive heart failure) ([place])    a. 10/2017 Echo: EF 50-55%, mild AI, sev dil LA, mod dil RA.  Marland Kitchen Chronic venous insufficiency   . HTN (hypertension)   . PAF (paroxysmal atrial fibrillation) ([place])    a. CHA2DS2VASc 5-->Eliquis; b. Prev WCT on flecainide;  c. 12/2017 s/p DCCV.  Marland Kitchen Pneumothorax   . Ventricular tachycardia (HCC)    a. possibly proarrhythmia from flecainide.    Past Surgical History:  Procedure Laterality Date  . CARDIOVERSION N/A 12/23/2017   Procedure: CARDIOVERSION;  Surgeon: Sueanne Margarita, MD;  Location: [facility] ENDOSCOPY;  Service: Cardiovascular;  Laterality: N/A;  . CARDIOVERSION N/A 03/08/2018   Procedure: CARDIOVERSION;  Surgeon: Sueanne Margarita, MD;  Location: [facility] ENDOSCOPY;  Service: Cardiovascular;  Laterality: N/A;  . INGUINAL HERNIA REPAIR     x2  . TEE WITHOUT CARDIOVERSION N/A 03/08/2018   Procedure: TRANSESOPHAGEAL ECHOCARDIOGRAM (TEE);  Surgeon: Sueanne Margarita, MD;  Location: [facility] ENDOSCOPY;  Service: Cardiovascular;  Laterality: N/A;  . TOTAL KNEE ARTHROPLASTY Left 02/28/2018   Procedure: LEFT TOTAL KNEE ARTHROPLASTY;  Surgeon: Gaynelle Arabian, MD;  Location: [facility] ORS;  Service: Orthopedics;  Laterality:  Left;    Social History   Socioeconomic History  . Marital status: Widowed    Spouse name: Not on file  . Number of children: Not on file  . Years of education: Not on file  . Highest education level: Not on file  Occupational History  . Occupation: retired  Tobacco Use  . Smoking status: Never Smoker  . Smokeless tobacco: Never Used  Substance and Sexual Activity  . Alcohol use: No    Comment: occsaoinal  . Drug use: Yes    Types: Other-see comments  . Sexual activity: Not Currently  Other Topics Concern  . Not on file  Social History  Narrative   Lives in [place] by himself but 2 dtrs nearby and help out when necessary.   Social Determinants of Health   Financial Resource Strain:   . Difficulty of Paying Living Expenses:   Food Insecurity:   . Worried About Charity fundraiser in the Last Year:   . Arboriculturist in the Last Year:   Transportation Needs:   . Film/video editor (Medical):   Marland Kitchen Lack of Transportation (Non-Medical):   Physical Activity:   . Days of Exercise per Week:   . Minutes of Exercise per Session:   Stress:   . Feeling of Stress :   Social Connections:   . Frequency of Communication with Friends and Family:   . Frequency of Social Gatherings with Friends and Family:   . Attends Religious Services:   . Active Member of Clubs or Organizations:   . Attends Archivist Meetings:   Marland Kitchen Marital Status:   Intimate Partner Violence:   . Fear of Current or Ex-Partner:   . Emotionally Abused:   Marland Kitchen Physically Abused:   . Sexually Abused:     Family History  Problem Relation Age of Onset  . Coronary artery disease Other     Current Outpatient Medications  Medication Sig Dispense Refill  . amiodarone (PACERONE) 200 MG tablet Take 1 tablet (200 mg total) by mouth daily. 90 tablet 0  . Ascorbic Acid (VITAMIN C PO) Take 1 tablet by mouth daily.     Marland Kitchen atorvastatin (LIPITOR) 20 MG tablet TAKE 1 TABLET BY MOUTH EVERY DAY (Patient taking differently: Take 20 mg by mouth daily. ) 90 tablet 1  . Cholecalciferol (VITAMIN D3) 2000 units TABS Take 2,000 Units by mouth daily.    . ferrous sulfate 325 (65 FE) MG tablet Take 1 tablet (325 mg total) by mouth daily. 30 tablet 0  . fish oil-omega-3 fatty acids 1000 MG capsule Take 2 g by mouth daily.      . furosemide (LASIX) 20 MG tablet Take 1 tablet (20 mg total) by mouth daily. 90 tablet 3  . glucosamine-chondroitin 500-400 MG tablet Take 1 tablet by mouth 2 (two) times daily.     . Multiple Vitamin (MULTIVITAMIN) tablet Take 1 tablet by mouth daily.       . nitroGLYCERIN (NITROSTAT) 0.4 MG SL tablet Place 1 tablet (0.4 mg total) under the tongue every 5 (five) minutes as needed for chest pain. 25 tablet 3  . pindolol (VISKEN) 10 MG tablet Take 0.5 tablets (5 mg total) by mouth 2 (two) times daily. 90 tablet 3  . vitamin E 400 UNIT capsule Take 400 Units by mouth daily.    Marland Kitchen ELIQUIS 5 MG TABS tablet TAKE 1 TABLET BY MOUTH TWICE A DAY (Patient not taking: No sig reported) 180 tablet 1  No current facility-administered medications for this visit.    No Known Allergies   REVIEW OF SYSTEMS:  Review of Systems  Constitutional: Negative for chills, fever and malaise/fatigue.  HENT: Negative for congestion and sore throat.   Respiratory: Negative for cough and shortness of breath.   Cardiovascular: Negative for chest pain, orthopnea and claudication.  Gastrointestinal: Negative for abdominal pain, constipation, diarrhea, heartburn, nausea and vomiting.  Musculoskeletal: Negative for myalgias.  Neurological: Negative for dizziness, weakness and headaches.    PHYSICAL EXAMINATION:  Vitals:   11/16/19 0856  BP: 128/62  Pulse: 60  Resp: 16  Temp: (!) 97 F (36.1 C)  TempSrc: Temporal  SpO2: (!) 82%  Weight: 149 lb 8 oz (67.8 kg)  Height: 6\' 2"  (1.88 m)    General:  WDWN in NAD; vital signs documented above Gait: Normal HENT: WNL, normocephalic Pulmonary: normal non-labored breathing , without Rales, rhonchi,  wheezing Cardiac: regular HR, without  Murmurs without carotid bruit Abdomen: soft, NT, no masses Skin: without rashes Vascular Exam/Pulses:  Right Left  Radial 2+ (normal) 2+ (normal)  Ulnar 1+ (weak) 1+ (weak)  Femoral 2+ (normal) 2+ (normal)  Popliteal 2+ (normal) 2+ (normal)  DP 1+ (weak) 2+ (normal)  PT 2+ (normal) 2+ (normal)       pinpoint scab of the lateral left leg proximal to the lateral malleolus where he had bleeding from superficial varicosity Extremities: without ischemic changes, without Gangrene  , without cellulitis; without open wounds; He does have hyperpigmentation of bilateral distal lower legs and multiple superficial varicosities of the distal legs and feet. No ulcerations Musculoskeletal: no muscle wasting or atrophy  Neurologic: A&O X 3;  No focal weakness or paresthesias are detected Psychiatric:  The pt has Normal affect.  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  He does have some reflux in the superficial system involving the saphenofemoral junction and proximal right great saphenous vein however the vein is small.  He has some reflux in the proximal small saphenous vein but again the vein small  ASSESSMENT/PLAN:: 79 y.o. 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 I have discussed with both the patient and his daughter that I would recommend sclerotherapy injection of this varicosity that has bleed recurrently. Unfortunately his superficial reflux is not amenable to ablation due to his small veins and conservative therapy is best treatment for this. He will continue his compression stockings, elevation, and avoidance of prolonged sitting or standing. In regard to his bleeding varicosity Estell Harpin- RN will be in contact with the patient to schedule this injection at her earliest convenience. Until then I have recommended elevation, compression wrap to the area with ACE bandage, and to hold pressure should bleeding occur. He may have to return to ED if he does have a significant bleeding event prior to scheduling of his sclerotherapy   Karoline Caldwell, PA-C [facility] [phone]  Clinic MD:  Dr. Scot Dock

## 2019-11-16 NOTE — Telephone Encounter (Signed)
Patient states that he was rushed to the ER last week due to another bleeding vein. His hemoglobin was low and he was advised to stop Eliquis for a few days. Advised him that per Tommy Rainwater, NP the patients H/H is now stable so he can now resume Eliquis.  Patient is very frustrated and states that he does not want to keep taking Eliquis or he would like to cut back and take Eliquis to every other day. I advised him on the importance of taking Eliquis because of his atrial fibrillation, he is at a higher risk for stroke. Advised patient he should continue taking Eliquis 5 mg twice per day. He states that he is only taking 2.5 mg twice per day.  He states that he is worried that he is going to "bleed out". He has followed up with vein specialist who have recommended sclerotherapy.  Advised patient that I would forward to Dr. Radford Pax for further recommendations.

## 2019-11-16 NOTE — Telephone Encounter (Signed)
Patient returned call. He hung up before I was able to get nurse on phone.

## 2019-11-17 ENCOUNTER — Encounter: Payer: Self-pay | Admitting: Physician Assistant

## 2019-11-17 ENCOUNTER — Other Ambulatory Visit: Payer: Self-pay

## 2019-11-17 ENCOUNTER — Ambulatory Visit (INDEPENDENT_AMBULATORY_CARE_PROVIDER_SITE_OTHER): Payer: Self-pay

## 2019-11-17 DIAGNOSIS — I8393 Asymptomatic varicose veins of bilateral lower extremities: Secondary | ICD-10-CM

## 2019-11-17 NOTE — Telephone Encounter (Signed)
Please find out when he is getting the sclerotherapy

## 2019-11-17 NOTE — Progress Notes (Signed)
Treated pt's L lower leg/foot/ankle area spider veins where he had previous bleed.  Pt received a total of 2 mL of Asclera 1% with a  27g butterfly.  He has some skin discoloration of the lower leg making veins harder to see. The visible ones were treated; pt tolerated excellently. Post procedure care instructions provided on both written handout and verbally. Will follow PRN.   Photos: Yes.    Compression stockings applied: Yes.

## 2019-11-19 NOTE — Telephone Encounter (Signed)
Reviewed chart - patient still has PAF and therefore at increased risk of CVA when holding Eliquis but given significant bleed recently ok to hold over the weekend in preparation for his sclerotherapy on Monday and then resume.

## 2019-11-20 ENCOUNTER — Ambulatory Visit: Payer: Medicare Other

## 2019-11-20 ENCOUNTER — Other Ambulatory Visit: Payer: Self-pay | Admitting: Cardiology

## 2019-11-20 ENCOUNTER — Telehealth: Payer: Self-pay

## 2019-11-20 NOTE — Telephone Encounter (Signed)
Pt called with questions regarding when to wear compression stockings s/p sclero treatment. Pt verbalized understanding; will call us back if he has further questions/concerns.

## 2019-11-20 NOTE — Telephone Encounter (Signed)
Attempted to call patient unable to leave voicemail.  

## 2019-11-21 ENCOUNTER — Telehealth: Payer: Self-pay | Admitting: Cardiology

## 2019-11-21 ENCOUNTER — Encounter: Payer: Self-pay | Admitting: *Deleted

## 2019-11-21 DIAGNOSIS — I10 Essential (primary) hypertension: Secondary | ICD-10-CM

## 2019-11-21 DIAGNOSIS — D62 Acute posthemorrhagic anemia: Secondary | ICD-10-CM

## 2019-11-21 NOTE — Telephone Encounter (Signed)
New Message   Pt c/o medication issue:  1. Name of Medication: Eliquis   2. How are you currently taking this medication (dosage and times per day)? 5 mg 2 x daily   3. Are you having a reaction (difficulty breathing--STAT)? No   4. What is your medication issue? Pt read that at his age he should only be taking 1 5 mg tablet daily  He is wanting the nurse to call him back to discuss this    Please call

## 2019-11-21 NOTE — Telephone Encounter (Signed)
Although he is over 21, his creatinine is < 1.5 and his weight is > 60kg and therefore needs Eliquis 5mg  BID.

## 2019-11-21 NOTE — Telephone Encounter (Signed)
Follow up ° ° °Patient is returning your call. Please call. ° ° ° °

## 2019-11-21 NOTE — Telephone Encounter (Signed)
I spoke with patient. He states he read on website for Eliquis that he should be taking Eliquis 2.5 mg twice daily.  He is asking we check with Dr Radford Pax regarding dosing. Current dose of Eliquis is 5 mg twice daily on patient's med list. Patient reports he has not resumed Eliquis. Per previous phone note sclerotherapy was scheduled for 5/3 and patient was to resume Eliquis after procedure.  Patient reports he had sclerotherapy on 4/30 and procedure went well.  I told him he should resume Eliquis 5 mg twice daily since he has had procedure. Will forward to Dr Radford Pax to confirm what dose of Eliquis she would like patient to take

## 2019-11-21 NOTE — Telephone Encounter (Signed)
Left message to call office

## 2019-11-22 NOTE — Telephone Encounter (Signed)
Left message for patient to call back  

## 2019-11-22 NOTE — Telephone Encounter (Signed)
Would recommend he come in for OV with extender.  Eliquis will not cause SOB.  He needs CBC done was well.

## 2019-11-22 NOTE — Telephone Encounter (Signed)
Fatigue and shortness of breath may be coming from his afib, not his Eliquis. Eliquis is unlikely to cause those side effects. Alternative options for anticoagulation would be Xarelto or Pradaxa. However, both of these options carry higher risk of bleeding than Eliquis.

## 2019-11-22 NOTE — Telephone Encounter (Signed)
Left message for patient letting him know that Dr. Radford Pax states he should be taking Eliquis 5 mg BID. Advised to call back with any questions.

## 2019-11-22 NOTE — Telephone Encounter (Signed)
Notified patient on recommendations per [facility]. Patient states that he does not want to switch to Xarelto or Pradaxa because of recent bleeding issues. He will continue on Eliquis 5 mg BID and let us know if any other concerns arise.

## 2019-11-22 NOTE — Telephone Encounter (Signed)
Patient returning call. Transferred call to the nurse. 

## 2019-11-22 NOTE — Telephone Encounter (Signed)
Patient called back and I reviewed Dr. Theodosia Blender advisement on his Eliquis. He states that he will continue to take it, but he is wondering if there is any other anticoagulant that he can be on. He states that he has fatigue and shortness of breath with Eliquis. He has tried warfarin in the past. Advised patient that I would reach out to Dr. Radford Pax and PharmD for any recommendations.

## 2019-11-22 NOTE — Addendum Note (Signed)
Addended by: Antonieta Iba on: 11/22/2019 03:28 PM   Modules accepted: Orders

## 2019-11-22 NOTE — Telephone Encounter (Signed)
Left message for patient to call back.  Needs to schedule an appointment with an APP as well as come in for CBC

## 2019-11-23 ENCOUNTER — Telehealth: Payer: Self-pay

## 2019-11-23 ENCOUNTER — Ambulatory Visit: Payer: Medicare Other

## 2019-11-23 NOTE — Telephone Encounter (Signed)
Left message for patient to call back  

## 2019-11-23 NOTE — Telephone Encounter (Signed)
Spoke with the patient and made him an appointment to see Richardson Dopp, PA-C on 05/12. He will have CBC drawn the same day.

## 2019-11-23 NOTE — Telephone Encounter (Signed)
Pt called to ask about resuming lotion after 2 weeks in compression hose after sclerotherapy. Also, asked about wearing compression after his 2 weeks are up. Pt has no further concerns/questions; will call us if anything arises.

## 2019-11-23 NOTE — Telephone Encounter (Signed)
   Pt returning call, transferred to [facility]

## 2019-11-29 ENCOUNTER — Ambulatory Visit: Payer: Medicare Other | Admitting: Physician Assistant

## 2019-11-29 ENCOUNTER — Other Ambulatory Visit: Payer: Medicare Other

## 2020-01-15 ENCOUNTER — Telehealth: Payer: Self-pay | Admitting: Cardiology

## 2020-01-15 NOTE — Telephone Encounter (Signed)
[name] is calling wanting to know if he still needs a Covid screening due to him being vaccinated. Please advise.

## 2020-01-15 NOTE — Telephone Encounter (Signed)
Spoke with the patient, he will no longer need COVID testing.

## 2020-01-26 ENCOUNTER — Other Ambulatory Visit (HOSPITAL_COMMUNITY): Payer: Medicare Other

## 2020-01-29 ENCOUNTER — Ambulatory Visit (INDEPENDENT_AMBULATORY_CARE_PROVIDER_SITE_OTHER): Payer: Medicare Other | Admitting: Internal Medicine

## 2020-01-29 DIAGNOSIS — I4819 Other persistent atrial fibrillation: Secondary | ICD-10-CM | POA: Diagnosis not present

## 2020-01-29 LAB — PULMONARY FUNCTION TEST
DL/VA % pred: 97 %
DL/VA: 3.73 ml/min/mmHg/L
DLCO cor % pred: 78 %
DLCO cor: 21.02 ml/min/mmHg
DLCO unc % pred: 78 %
DLCO unc: 21.02 ml/min/mmHg
FEF 25-75 Post: 1.2 L/sec
FEF 25-75 Pre: 1.15 L/sec
FEF2575-%Change-Post: 4 %
FEF2575-%Pred-Post: 52 %
FEF2575-%Pred-Pre: 50 %
FEV1-%Change-Post: 1 %
FEV1-%Pred-Post: 71 %
FEV1-%Pred-Pre: 70 %
FEV1-Post: 2.33 L
FEV1-Pre: 2.29 L
FEV1FVC-%Change-Post: -1 %
FEV1FVC-%Pred-Pre: 86 %
FEV6-%Change-Post: 3 %
FEV6-%Pred-Post: 88 %
FEV6-%Pred-Pre: 85 %
FEV6-Post: 3.77 L
FEV6-Pre: 3.65 L
FEV6FVC-%Change-Post: 0 %
FEV6FVC-%Pred-Post: 106 %
FEV6FVC-%Pred-Pre: 105 %
FVC-%Change-Post: 2 %
FVC-%Pred-Post: 83 %
FVC-%Pred-Pre: 81 %
FVC-Post: 3.79 L
FVC-Pre: 3.68 L
Post FEV1/FVC ratio: 61 %
Post FEV6/FVC ratio: 100 %
Pre FEV1/FVC ratio: 62 %
Pre FEV6/FVC Ratio: 99 %
RV % pred: 175 %
RV: 4.95 L
TLC % pred: 112 %
TLC: 8.6 L

## 2020-01-29 NOTE — Progress Notes (Signed)
Full PFT performed today. °

## 2020-02-01 ENCOUNTER — Telehealth: Payer: Self-pay | Admitting: Cardiology

## 2020-02-01 NOTE — Telephone Encounter (Signed)
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  If yes, how much?

## 2020-02-01 NOTE — Telephone Encounter (Signed)
Called patient back with our pharmacist recommendations. Patient verbalized understanding.

## 2020-02-01 NOTE — Telephone Encounter (Signed)
Will route to pharmD since patient is on eliquis.

## 2020-02-01 NOTE — Telephone Encounter (Signed)
Would recommend he try a topical NSAID first like Voltaren gel to use on his knees. This is not absorbed systemically as much as oral NSAIDs are, which can increase his bleeding risk since he also takes Eliquis.

## 2020-02-28 ENCOUNTER — Other Ambulatory Visit: Payer: Self-pay | Admitting: Cardiology

## 2020-03-11 ENCOUNTER — Telehealth: Payer: Self-pay | Admitting: Cardiology

## 2020-03-11 NOTE — Telephone Encounter (Signed)
  Pt c/o medication issue:  1. Name of Medication:   ELIQUIS 5 MG TABS tablet   2. How are you currently taking this medication (dosage and times per day)? 1 tablet by mouth two times daily   3. Are you having a reaction (difficulty breathing--STAT)?   4. What is your medication issue? [name] is calling requesting a nurse call him in regards to a question he has about bleeding on Eliquis. Patient states he is not currently bleeding, he just has a question in regards to it. Please advise.

## 2020-03-11 NOTE — Telephone Encounter (Signed)
Spoke with the patient who states that he is having bad bruising all over his hands. He states that he works in his garden a lot. He would like to know if he can be on a lower does of Eliquis. He states that he has a history of bleeding veins. He did have recent sclerotherapy for this. Patient does not complain of active bleeding. He has many concerns about the dose of Eliquis that he is on and the bruising that it is causing along with risk of bleeding. I advised patient on the prior recommendations from Dr. Radford Pax on dose of Eliquis and that he is on the correct dose. Patient continued to voice concern with the dose that he is on and would like for Dr. Radford Pax to consider reducing his dose. I advised him that I would send a message to Dr. Radford Pax but he should continue his current dose at this time.

## 2020-03-11 NOTE — Telephone Encounter (Signed)
Left message for patient to call back  

## 2020-03-11 NOTE — Telephone Encounter (Signed)
Patient is returning [place] call. Please call back.

## 2020-03-11 NOTE — Telephone Encounter (Signed)
Please see previous phone note.  

## 2020-03-11 NOTE — Telephone Encounter (Signed)
Please find out what patient's current weight is

## 2020-03-11 NOTE — Telephone Encounter (Signed)
Pt has questions about the Eliquis that he has been taken for a while. Wants to talk with Dr. Theodosia Blender nurse

## 2020-03-12 ENCOUNTER — Telehealth: Payer: Self-pay

## 2020-03-12 NOTE — Telephone Encounter (Signed)
Spoke with the patient and advised him that he was on the proper dose of Eliquis and that lowering his dose would not protect him from a CVA. Patient states that he had another episode of bleeding this morning while he was at the gym. He states that another vein had burst in his leg. He was able to stop the bleeding with some assistance. He has a follow up with the vein specialist tomorrow.  Patient states that he can't continue taking Eliquis because he can't continue to bleed so easily. He fears that he is going to bleed out while he is asleep.  He is asking about other medications that he could switch to. I advised him that we had spoken to the pharmacist in regards to this back in May who stated that his other options for anticoagulation have a higher risk of bleeding than Eliquis and so Eliquis is his best option. Patient states that he is considering cutting his Eliquis in half on his own then.

## 2020-03-12 NOTE — Telephone Encounter (Signed)
He is 68kg with SCr < 1.5 so appropriate dose of Eliquis for him is 5mg  BID.  If we decrease dose to 2.5mg  BID he is not protected from CVA

## 2020-03-12 NOTE — Telephone Encounter (Signed)
Pt called yesterday and today with c/o rebleed at L lower leg vein that had been treated in April 2021 with sclerotherapy. Pt is very concerned and anxious and wants to be seen immediately. I have made him an appt with MD to see if another tx is recommended at this point. Pt verbalized understanding.

## 2020-03-12 NOTE — Telephone Encounter (Signed)
Left message for patient to call back  

## 2020-03-12 NOTE — Telephone Encounter (Signed)
    Pt is returning call from [facility]

## 2020-03-12 NOTE — Telephone Encounter (Signed)
Patient sees Dr. [name]

## 2020-03-12 NOTE — Telephone Encounter (Signed)
Follow Up:   Pt  Said he talked to youu earlier, he needs to talk to you again please.

## 2020-03-12 NOTE — Telephone Encounter (Signed)
There is no other option that protects him from cardioembolic events.  If he continues to have bleeding issues then well need to stop Eliquis and be at risk of CVA.  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

## 2020-03-12 NOTE — Telephone Encounter (Signed)
Patient called back and states that he just weighed himself and is actually 151lbs.

## 2020-03-12 NOTE — Telephone Encounter (Signed)
Patient states that his current weight is 154 lb

## 2020-03-13 ENCOUNTER — Other Ambulatory Visit: Payer: Self-pay

## 2020-03-13 ENCOUNTER — Telehealth: Payer: Self-pay

## 2020-03-13 ENCOUNTER — Ambulatory Visit (INDEPENDENT_AMBULATORY_CARE_PROVIDER_SITE_OTHER): Payer: Medicare Other | Admitting: Vascular Surgery

## 2020-03-13 ENCOUNTER — Encounter: Payer: Self-pay | Admitting: Vascular Surgery

## 2020-03-13 VITALS — BP 166/76 | HR 48 | Temp 98.0°F | Resp 20 | Ht 74.0 in | Wt 149.0 lb

## 2020-03-13 DIAGNOSIS — I83892 Varicose veins of left lower extremities with other complications: Secondary | ICD-10-CM

## 2020-03-13 DIAGNOSIS — I2510 Atherosclerotic heart disease of native coronary artery without angina pectoris: Secondary | ICD-10-CM | POA: Diagnosis not present

## 2020-03-13 DIAGNOSIS — I872 Venous insufficiency (chronic) (peripheral): Secondary | ICD-10-CM

## 2020-03-13 NOTE — Progress Notes (Signed)
REASON FOR VISIT:   Follow-up of chronic venous insufficiency  MEDICAL ISSUES:   CHRONIC VENOUS INSUFFICIENCY: This patient had another bleeding episode from the same area of his varicose vein on the posterior left ankle.  Based on his previous reflux studies there is no correctable proximal disease at this point.  I did look myself with the SonoSite and there appears to be an underlying small perforator.  I do not think there is a role for sclerotherapy as there is no significant superficial varicosities or spider veins noted here.  I have instructed him to keep his skin well lubricated and to keep a Band-Aid over this area to prevent him from rubbing it.  When he is at the gym especially I encouraged him to keep a small dressing over this with a 2 inch Ace bandage to keep some small pressure on this.  If he has recurrent bleeding the only thing I could recommend would be to take him to the operating room and excise this ulcer and address the underlying perforator.  I will plan on seeing him back in 3 months with a formal venous reflux test on the left.  He knows to call sooner if he has any further bleeding problems.    HPI:   [name] is a pleasant 79 y.o. 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  The patient had 2 bleeding episodes from his varicose veins in the left leg.  One episode was in March 2020 and then most recently in July 2020.  On exam at that time, he had palpable pedal pulses.  He had significant hyperpigmentation on the left. He had corona phlebectatica of the left foot where he has had the 2 venous bleeding episodes.  His venous duplex scan of the left lower extremity at that time showed that he had deep venous reflux involving the common femoral vein femoral vein and popliteal vein.  There was some reflux at the saphenofemoral junction in the superficial system and in the proximal left great saphenous vein but the vein was not  dilated.  The patient had CEAP C4c venous disease.  At the time of that visit we discussed conservative measures including importance of intermittent leg elevation the proper positioning for this.  I wrote him a prescription for knee-high compression stockings with a gradient of 15 to 20 mmHg.  I encouraged him to avoid prolonged sitting and standing.  We discussed the importance of exercise.  I also encouraged him to keep his skin well lubricated.  I felt that if he had recurrent bleeding issues he might be a candidate for sclerotherapy of the left foot.  He was at the gym yesterday and developed bleeding from the posterior aspect of his left medial malleolus where he had a small varicosity previously.  He is undergone previous sclerotherapy with good result.  This is 1 very small area about a millimeter in diameter.  The paramedics were able to get this to stop.  He is on Eliquis for atrial fibrillation.  Past Medical History:  Diagnosis Date  . Ascending aorta dilatation (HCC)    40mm by chest CT angio 04/2018  . CAD (coronary artery disease)    a. 05/2006 Abnl stress test w/ 2-37mm ST dep; b. 05/2006 Cath/PCI: LM nl, LAD 30-40ost, 20-30p, 90d (small), D1 nl, D2 nl, LCX nl, OM1/2 nl, RCA Ca2+, 36m/d (3.5x16 Liberty BMS & 3.5x12 Liberty BMS), EF 50%.  Marland Kitchen  Chronic diastolic CHF (congestive heart failure) ([place])    a. 10/2017 Echo: EF 50-55%, mild AI, sev dil LA, mod dil RA.  Marland Kitchen Chronic venous insufficiency   . HTN (hypertension)   . PAF (paroxysmal atrial fibrillation) ([place])    a. CHA2DS2VASc 5-->Eliquis; b. Prev WCT on flecainide;  c. 12/2017 s/p DCCV.  Marland Kitchen Pneumothorax   . Ventricular tachycardia (HCC)    a. possibly proarrhythmia from flecainide.    Family History  Problem Relation Age of Onset  . Coronary artery disease Other     SOCIAL HISTORY: Social History   Tobacco Use  . Smoking status: Never Smoker  . Smokeless tobacco: Never Used  Substance Use Topics  . Alcohol use: No     Comment: occsaoinal    No Known Allergies  Current Outpatient Medications  Medication Sig Dispense Refill  . amiodarone (PACERONE) 200 MG tablet TAKE 1 TABLET BY MOUTH EVERY DAY 90 tablet 3  . Ascorbic Acid (VITAMIN C PO) Take 1 tablet by mouth daily.     Marland Kitchen atorvastatin (LIPITOR) 20 MG tablet TAKE 1 TABLET BY MOUTH EVERY DAY 90 tablet 1  . Cholecalciferol (VITAMIN D3) 2000 units TABS Take 2,000 Units by mouth daily.    Marland Kitchen ELIQUIS 5 MG TABS tablet TAKE 1 TABLET BY MOUTH TWICE A DAY 180 tablet 1  . ferrous sulfate 325 (65 FE) MG tablet Take 1 tablet (325 mg total) by mouth daily. 30 tablet 0  . fish oil-omega-3 fatty acids 1000 MG capsule Take 2 g by mouth daily.      . furosemide (LASIX) 20 MG tablet Take 1 tablet (20 mg total) by mouth daily. 90 tablet 3  . glucosamine-chondroitin 500-400 MG tablet Take 1 tablet by mouth 2 (two) times daily.     . Magnesium 400 MG CAPS Take 400 mg by mouth daily.    . Multiple Vitamin (MULTIVITAMIN) tablet Take 1 tablet by mouth daily.      . nitroGLYCERIN (NITROSTAT) 0.4 MG SL tablet Place 1 tablet (0.4 mg total) under the tongue every 5 (five) minutes as needed for chest pain. 25 tablet 3  . pindolol (VISKEN) 10 MG tablet Take 0.5 tablets (5 mg total) by mouth 2 (two) times daily. 90 tablet 3  . vitamin B-12 (CYANOCOBALAMIN) 1000 MCG tablet Take 1,000 mcg by mouth daily.    . vitamin E 400 UNIT capsule Take 400 Units by mouth daily.    . Zinc 50 MG CAPS Take 50 mg by mouth.     No current facility-administered medications for this visit.    REVIEW OF SYSTEMS:  [X]  denotes positive finding, [ ]  denotes negative finding Cardiac  Comments:  Chest pain or chest pressure:    Shortness of breath upon exertion:    Short of breath when lying flat:    Irregular heart rhythm:        Vascular    Pain in calf, thigh, or hip brought on by ambulation:    Pain in feet at night that wakes you up from your sleep:     Blood clot in your veins:    Leg swelling:          Pulmonary    Oxygen at home:    Productive cough:     Wheezing:         Neurologic    Sudden weakness in arms or legs:     Sudden numbness in arms or legs:     Sudden onset of difficulty  speaking or slurred speech:    Temporary loss of vision in one eye:     Problems with dizziness:         Gastrointestinal    Blood in stool:     Vomited blood:         Genitourinary    Burning when urinating:     Blood in urine:        Psychiatric    Major depression:         Hematologic    Bleeding problems:    Problems with blood clotting too easily:        Skin    Rashes or ulcers:        Constitutional    Fever or chills:     PHYSICAL EXAM:   Vitals:   03/13/20 1021  BP: (!) 166/76  Pulse: (!) 48  Resp: 20  Temp: 98 F (36.7 C)  SpO2: 97%  Weight: 67.6 kg  Height: 6\' 2"  (1.88 m)    GENERAL: The patient is a well-nourished male, in no acute distress. The vital signs are documented above. CARDIAC: There is a regular rate and rhythm.  VASCULAR: The patient has a palpable left posterior tibial pulse. He has hyperpigmentation bilaterally consistent with chronic venous insufficiency. The one small area that bled is about a millimeter in diameter.  The photo it is of the left lateral malleolus.   I did look with the SonoSite and that does appear to be a small underlying perforator associated with this area.  PULMONARY: There is good air exchange bilaterally without wheezing or rales. ABDOMEN: Soft and non-tender with normal pitched bowel sounds.  MUSCULOSKELETAL: There are no major deformities or cyanosis. NEUROLOGIC: No focal weakness or paresthesias are detected. SKIN: There are no ulcers or rashes noted. PSYCHIATRIC: The patient has a normal affect.  DATA:    No new data  [name] [facility] [phone]

## 2020-03-13 NOTE — Telephone Encounter (Signed)
I have sent a note to Dr. Scot Dock to get some guidance on how big an issue the varicose vein bleeding is

## 2020-03-13 NOTE — Telephone Encounter (Signed)
-----   Message from Sueanne Margarita, MD sent at 03/13/2020 11:24 AM EDT ----- Please let patient know that I talked with Dr. Scot Dock and we both agree that he needs to stay on Eliquis.  Dr. Scot Dock says he can manage bleeding issues and agrees that if he stops anticoagulation and has a stroke it could be catastrophic Traci ----- Message ----- From: Angelia Mould, MD Sent: 03/13/2020  10:49 AM EDT To: Sueanne Margarita, MD  Hi Trai,  I would agree with you that he needs to continue his Eliquis.  I do not think that we will change his risk for bleeding from the small wound on the lateral aspect of his left ankle.  This is only about a millimeter in diameter.  I do not think sclerotherapy would play a role here.  There are some underlying deeper veins which could not be addressed with sclerotherapy.  There is no proximal disease that I can address with the laser ablation.  I told him that he if he has recurrent bleeding the only option would be to take him to the operating room and excise the small area and try to address any underlying perforating veins.  The main risk with this would be wound healing given his chronic venous insufficiency. I will continue to follow him closely. Gerald Stabs ----- Message ----- From: Sueanne Margarita, MD Sent: 03/13/2020   8:40 AM EDT To: Angelia Mould, MD  Hi Gerald Stabs  I was hoping you could give me some guidance on Mr. Lemen.  He is on Eliquis for Afib with a high CHADS2VASC score.  As you know he has had issues with bleeding varicose veins and feels the Eliquis is the reason.  Can you give me some guidance on how big an issue the bleeding is and if he needs to be off anticoagulation permanently.  He has called our office multiple times stating that he needs to be off blood thinners but his risk ov CVA is high.    Traci

## 2020-03-13 NOTE — Telephone Encounter (Signed)
Spoke with the patient and advised him that per Dr. Radford Pax and Dr. Scot Dock he needs to remain on his current dose of Eliquis. Advised him that his bleeding can be managed and outcomes of him having a stroke if he stops taking Elqiuis could be severe. Patient continued to voice concern of bleeding. I repeated multiple times the recommendations from both Dr. Radford Pax and Dr. Scot Dock. Patient agreeable to continue Eliquis at current dose.

## 2020-03-13 NOTE — H&P (View-Only) (Signed)
REASON FOR VISIT:   Follow-up of chronic venous insufficiency  MEDICAL ISSUES:   CHRONIC VENOUS INSUFFICIENCY: This patient had another bleeding episode from the same area of his varicose vein on the posterior left ankle.  Based on his previous reflux studies there is no correctable proximal disease at this point.  I did look myself with the SonoSite and there appears to be an underlying small perforator.  I do not think there is a role for sclerotherapy as there is no significant superficial varicosities or spider veins noted here.  I have instructed him to keep his skin well lubricated and to keep a Band-Aid over this area to prevent him from rubbing it.  When he is at the gym especially I encouraged him to keep a small dressing over this with a 2 inch Ace bandage to keep some small pressure on this.  If he has recurrent bleeding the only thing I could recommend would be to take him to the operating room and excise this ulcer and address the underlying perforator.  I will plan on seeing him back in 3 months with a formal venous reflux test on the left.  He knows to call sooner if he has any further bleeding problems.    HPI:   [name] is a pleasant 79 y.o. 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  The patient had 2 bleeding episodes from his varicose veins in the left leg.  One episode was in March 2020 and then most recently in July 2020.  On exam at that time, he had palpable pedal pulses.  He had significant hyperpigmentation on the left. He had corona phlebectatica of the left foot where he has had the 2 venous bleeding episodes.  His venous duplex scan of the left lower extremity at that time showed that he had deep venous reflux involving the common femoral vein femoral vein and popliteal vein.  There was some reflux at the saphenofemoral junction in the superficial system and in the proximal left great saphenous vein but the vein was not  dilated.  The patient had CEAP C4c venous disease.  At the time of that visit we discussed conservative measures including importance of intermittent leg elevation the proper positioning for this.  I wrote him a prescription for knee-high compression stockings with a gradient of 15 to 20 mmHg.  I encouraged him to avoid prolonged sitting and standing.  We discussed the importance of exercise.  I also encouraged him to keep his skin well lubricated.  I felt that if he had recurrent bleeding issues he might be a candidate for sclerotherapy of the left foot.  He was at the gym yesterday and developed bleeding from the posterior aspect of his left medial malleolus where he had a small varicosity previously.  He is undergone previous sclerotherapy with good result.  This is 1 very small area about a millimeter in diameter.  The paramedics were able to get this to stop.  He is on Eliquis for atrial fibrillation.  Past Medical History:  Diagnosis Date  . Ascending aorta dilatation (HCC)    62mm by chest CT angio 04/2018  . CAD (coronary artery disease)    a. 05/2006 Abnl stress test w/ 2-72mm ST dep; b. 05/2006 Cath/PCI: LM nl, LAD 30-40ost, 20-30p, 90d (small), D1 nl, D2 nl, LCX nl, OM1/2 nl, RCA Ca2+, 57m/d (3.5x16 Liberty BMS & 3.5x12 Liberty BMS), EF 50%.  Marland Kitchen  Chronic diastolic CHF (congestive heart failure) ([place])    a. 10/2017 Echo: EF 50-55%, mild AI, sev dil LA, mod dil RA.  Marland Kitchen Chronic venous insufficiency   . HTN (hypertension)   . PAF (paroxysmal atrial fibrillation) ([place])    a. CHA2DS2VASc 5-->Eliquis; b. Prev WCT on flecainide;  c. 12/2017 s/p DCCV.  Marland Kitchen Pneumothorax   . Ventricular tachycardia (HCC)    a. possibly proarrhythmia from flecainide.    Family History  Problem Relation Age of Onset  . Coronary artery disease Other     SOCIAL HISTORY: Social History   Tobacco Use  . Smoking status: Never Smoker  . Smokeless tobacco: Never Used  Substance Use Topics  . Alcohol use: No     Comment: occsaoinal    No Known Allergies  Current Outpatient Medications  Medication Sig Dispense Refill  . amiodarone (PACERONE) 200 MG tablet TAKE 1 TABLET BY MOUTH EVERY DAY 90 tablet 3  . Ascorbic Acid (VITAMIN C PO) Take 1 tablet by mouth daily.     Marland Kitchen atorvastatin (LIPITOR) 20 MG tablet TAKE 1 TABLET BY MOUTH EVERY DAY 90 tablet 1  . Cholecalciferol (VITAMIN D3) 2000 units TABS Take 2,000 Units by mouth daily.    Marland Kitchen ELIQUIS 5 MG TABS tablet TAKE 1 TABLET BY MOUTH TWICE A DAY 180 tablet 1  . ferrous sulfate 325 (65 FE) MG tablet Take 1 tablet (325 mg total) by mouth daily. 30 tablet 0  . fish oil-omega-3 fatty acids 1000 MG capsule Take 2 g by mouth daily.      . furosemide (LASIX) 20 MG tablet Take 1 tablet (20 mg total) by mouth daily. 90 tablet 3  . glucosamine-chondroitin 500-400 MG tablet Take 1 tablet by mouth 2 (two) times daily.     . Magnesium 400 MG CAPS Take 400 mg by mouth daily.    . Multiple Vitamin (MULTIVITAMIN) tablet Take 1 tablet by mouth daily.      . nitroGLYCERIN (NITROSTAT) 0.4 MG SL tablet Place 1 tablet (0.4 mg total) under the tongue every 5 (five) minutes as needed for chest pain. 25 tablet 3  . pindolol (VISKEN) 10 MG tablet Take 0.5 tablets (5 mg total) by mouth 2 (two) times daily. 90 tablet 3  . vitamin B-12 (CYANOCOBALAMIN) 1000 MCG tablet Take 1,000 mcg by mouth daily.    . vitamin E 400 UNIT capsule Take 400 Units by mouth daily.    . Zinc 50 MG CAPS Take 50 mg by mouth.     No current facility-administered medications for this visit.    REVIEW OF SYSTEMS:  [X]  denotes positive finding, [ ]  denotes negative finding Cardiac  Comments:  Chest pain or chest pressure:    Shortness of breath upon exertion:    Short of breath when lying flat:    Irregular heart rhythm:        Vascular    Pain in calf, thigh, or hip brought on by ambulation:    Pain in feet at night that wakes you up from your sleep:     Blood clot in your veins:    Leg swelling:          Pulmonary    Oxygen at home:    Productive cough:     Wheezing:         Neurologic    Sudden weakness in arms or legs:     Sudden numbness in arms or legs:     Sudden onset of difficulty  speaking or slurred speech:    Temporary loss of vision in one eye:     Problems with dizziness:         Gastrointestinal    Blood in stool:     Vomited blood:         Genitourinary    Burning when urinating:     Blood in urine:        Psychiatric    Major depression:         Hematologic    Bleeding problems:    Problems with blood clotting too easily:        Skin    Rashes or ulcers:        Constitutional    Fever or chills:     PHYSICAL EXAM:   Vitals:   03/13/20 1021  BP: (!) 166/76  Pulse: (!) 48  Resp: 20  Temp: 98 F (36.7 C)  SpO2: 97%  Weight: 67.6 kg  Height: 6\' 2"  (1.88 m)    GENERAL: The patient is a well-nourished male, in no acute distress. The vital signs are documented above. CARDIAC: There is a regular rate and rhythm.  VASCULAR: The patient has a palpable left posterior tibial pulse. He has hyperpigmentation bilaterally consistent with chronic venous insufficiency. The one small area that bled is about a millimeter in diameter.  The photo it is of the left lateral malleolus.   I did look with the SonoSite and that does appear to be a small underlying perforator associated with this area.  PULMONARY: There is good air exchange bilaterally without wheezing or rales. ABDOMEN: Soft and non-tender with normal pitched bowel sounds.  MUSCULOSKELETAL: There are no major deformities or cyanosis. NEUROLOGIC: No focal weakness or paresthesias are detected. SKIN: There are no ulcers or rashes noted. PSYCHIATRIC: The patient has a normal affect.  DATA:    No new data  [name] [facility] [phone]

## 2020-03-14 ENCOUNTER — Other Ambulatory Visit: Payer: Self-pay | Admitting: *Deleted

## 2020-03-14 DIAGNOSIS — I872 Venous insufficiency (chronic) (peripheral): Secondary | ICD-10-CM

## 2020-03-15 ENCOUNTER — Telehealth: Payer: Self-pay

## 2020-03-15 NOTE — Telephone Encounter (Signed)
I spoke with Judy Pimple about patient's questions and concerns. He advised that everything that Dr. Scot Dock recommends is correct. He states that if patient follows the instructions from Dr. Scot Dock he shouldn't have any additional bleeding, but , if patient does then it is ok to use gauze to cover the area and he should avoid using adhesive bandages. Also, he said there is no time frame between Sclero therapy so patient is able to schedule PRN Sclero therapy with Nancy,RN. I advised patient of Matt's recommendations, I also let patient know that I will reach out to [facility] about scheduling Sclero therapy. Patient voiced his understanding.

## 2020-03-15 NOTE — Telephone Encounter (Signed)
Patient called requesting additional information regarding his veins bleeding. He states he is currently wearing compression hose that he has purchased outside of our office. I advised per Dr. Nicole Cella last [name] note he recommends patient continues to wear 15-20 Compression hose, Avoid prolonged sitting or standing (I provided clarity on ways to avoid prolonged sitting and standing to patient.), keeping his legs moisturized and lubricated along with the importance of exercising. Patient is requesting more info about a treatment he had back in April with Izora Gala, [place]. He states how soon and often is he allowed to have those treatments? Also requesting to know if this treatment would prevent him from bleeding? He also inquired to know if he should keep a bandage on his legs in case of bleeding during exercising? I advised that I would either speak with Izora Gala or Dr. Scot Dock and return his call once I have definite answers. Patient voiced his understanding.

## 2020-03-18 ENCOUNTER — Telehealth: Payer: Self-pay

## 2020-03-18 NOTE — Telephone Encounter (Signed)
Returned pt's call with questions regarding doing another sclerotherapy tx to bleeding vein that was first treated in April. He saw Dr. Scot Dock last week who discussed keeping area covered with a band-aid and if rebleed occurs going to the OR for treatment. Pt is still wanting to try another tx. I will ask Dr. Scot Dock if this is appropriate.

## 2020-03-20 ENCOUNTER — Telehealth: Payer: Self-pay

## 2020-03-20 ENCOUNTER — Telehealth: Payer: Self-pay | Admitting: Cardiology

## 2020-03-20 NOTE — Telephone Encounter (Signed)
Left message for Estill Bamberg at [facility]. She will let Izora Gala know and will call us back tomorrow.

## 2020-03-20 NOTE — Telephone Encounter (Signed)
Pt scheduled sclerotherapy appt yesterday and has since changed his mind and would like to have surgery on the bleeding vein instead. This was discussed at his MD appt last week. He has called back requesting that we speak to Dr. Golden Hurter before scheduling this. I have called her nurse and left a message with their office.

## 2020-03-20 NOTE — Telephone Encounter (Signed)
New Message:    [name] from [facility] called. She said she was aked by the pt to call and tallk to Dr Theodosia Blender nurse.

## 2020-03-22 ENCOUNTER — Other Ambulatory Visit: Payer: Self-pay | Admitting: *Deleted

## 2020-03-26 ENCOUNTER — Telehealth: Payer: Self-pay | Admitting: *Deleted

## 2020-03-26 NOTE — Telephone Encounter (Signed)
Returning earlier telephone voice message from Mr. Santo regarding paperwork he received in the mail from [facility] regarding a venous reflux exam and [facility] Follow up appointment that he is scheduled for on 06-20-2020.  Was not able to speak directly with Mr. Delisle today. Left detailed telephone voice message for Mr. Vacha to disregard the mail from [facility] and the appointments scheduled for 06-20-2020.  Those appointments were scheduled on 03-03-2020 after his office visit with Dr. Scot Dock and his plan of care has changed with surgery now scheduled for 04-08-2020.  Reinforced to Mr. Propp to disregard the 06-20-2020 venous reflux study and follow up appointment with Dr. Scot Dock.

## 2020-03-29 ENCOUNTER — Telehealth: Payer: Self-pay | Admitting: Cardiology

## 2020-03-29 NOTE — Telephone Encounter (Signed)
Left message for patient to call back  

## 2020-03-29 NOTE — Telephone Encounter (Signed)
Spoke with the patient who states that he is having surgery on a vein in his leg next week. He is wanting to know about medications to hold prior to the surgery. I advised him to reach out to the surgeon's office for this information and if they need clearance from [place] they can reach out to our pre-op clearance team. Patient also still expresses many concerns with taking Eliquis and the bruising it is causing on his hands. This has been discussed with the patient multiple times - he is aware that he needs to remain on Eliquis.

## 2020-03-29 NOTE — Telephone Encounter (Signed)
New message:     Patient calling to ask the nurse some questions concerning some information for a up coming surgery. Please call patient.

## 2020-03-29 NOTE — Telephone Encounter (Signed)
Pt is returning call.  

## 2020-04-01 ENCOUNTER — Telehealth: Payer: Self-pay | Admitting: Cardiology

## 2020-04-01 NOTE — Telephone Encounter (Signed)
New Message:    Please call, questions about his procedure on 04-08-20.

## 2020-04-01 NOTE — Telephone Encounter (Signed)
Patient called looking to talk with Dr. Theodosia Blender nurse. Please call back

## 2020-04-01 NOTE — Telephone Encounter (Signed)
Left message for patient to call back  

## 2020-04-02 NOTE — Telephone Encounter (Signed)
Left message for patient to call back  

## 2020-04-02 NOTE — Telephone Encounter (Signed)
Please see previous phone note.  

## 2020-04-03 NOTE — Telephone Encounter (Signed)
Follow up  Pt is returning call to [facility] He is wondering about medications before his procedure    Please call back

## 2020-04-03 NOTE — Telephone Encounter (Signed)
Attempted to call patient back. Unable to leave voicemail.  

## 2020-04-04 ENCOUNTER — Encounter (HOSPITAL_COMMUNITY): Payer: Self-pay | Admitting: Vascular Surgery

## 2020-04-04 NOTE — Telephone Encounter (Signed)
Left message for patient to call back  

## 2020-04-04 NOTE — Telephone Encounter (Signed)
Spoke with the patient who is wondering what medications he is supposed to stop taking prior to his surgery with Dr. Scot Dock on Monday 9/20. I advised the patient that he needs to reach out to Dr. Nicole Cella office for that information. Patient verbalized understanding.  Patient states that he continues to have bruising on his hands and that he bleeds very easily and he is concerned about taking Eliquis. I advised the patient that per previous cnoversations with both Dr. Radford Pax and Dr. Scot Dock the patient needs to remain on Eliquis due to his risk of a stroke.

## 2020-04-04 NOTE — Telephone Encounter (Signed)
Patient returned your call.

## 2020-04-04 NOTE — Telephone Encounter (Signed)
Patient calling to speak with Carly.

## 2020-04-05 ENCOUNTER — Other Ambulatory Visit (HOSPITAL_COMMUNITY)
Admission: RE | Admit: 2020-04-05 | Discharge: 2020-04-05 | Disposition: A | Discharge status: Home / Self Care | Payer: Medicare Other | Source: Ambulatory Visit | Attending: Vascular Surgery | Admitting: Vascular Surgery

## 2020-04-05 DIAGNOSIS — Z20822 Contact with and (suspected) exposure to covid-19: Secondary | ICD-10-CM | POA: Insufficient documentation

## 2020-04-05 DIAGNOSIS — Z01812 Encounter for preprocedural laboratory examination: Secondary | ICD-10-CM | POA: Diagnosis present

## 2020-04-06 LAB — SARS CORONAVIRUS 2 (TAT 6-24 HRS): SARS Coronavirus 2: NEGATIVE

## 2020-04-08 ENCOUNTER — Ambulatory Visit (HOSPITAL_COMMUNITY)
Admission: RE | Admit: 2020-04-08 | Discharge: 2020-04-08 | Disposition: A | Discharge status: Home / Self Care | Payer: Medicare Other | Attending: Vascular Surgery | Admitting: Vascular Surgery

## 2020-04-08 ENCOUNTER — Ambulatory Visit (HOSPITAL_COMMUNITY): Payer: Medicare Other | Admitting: Certified Registered Nurse Anesthetist

## 2020-04-08 ENCOUNTER — Encounter (HOSPITAL_COMMUNITY)
Admission: RE | Disposition: A | Discharge status: Home / Self Care | Payer: Self-pay | Source: Home / Self Care | Attending: Vascular Surgery

## 2020-04-08 ENCOUNTER — Other Ambulatory Visit: Payer: Self-pay

## 2020-04-08 ENCOUNTER — Encounter (HOSPITAL_COMMUNITY): Payer: Self-pay | Admitting: Vascular Surgery

## 2020-04-08 DIAGNOSIS — I110 Hypertensive heart disease with heart failure: Secondary | ICD-10-CM | POA: Diagnosis not present

## 2020-04-08 DIAGNOSIS — I480 Paroxysmal atrial fibrillation: Secondary | ICD-10-CM | POA: Insufficient documentation

## 2020-04-08 DIAGNOSIS — I4819 Other persistent atrial fibrillation: Secondary | ICD-10-CM | POA: Diagnosis not present

## 2020-04-08 DIAGNOSIS — L97929 Non-pressure chronic ulcer of unspecified part of left lower leg with unspecified severity: Secondary | ICD-10-CM | POA: Insufficient documentation

## 2020-04-08 DIAGNOSIS — M1990 Unspecified osteoarthritis, unspecified site: Secondary | ICD-10-CM | POA: Insufficient documentation

## 2020-04-08 DIAGNOSIS — I83892 Varicose veins of left lower extremities with other complications: Secondary | ICD-10-CM | POA: Diagnosis not present

## 2020-04-08 DIAGNOSIS — I872 Venous insufficiency (chronic) (peripheral): Secondary | ICD-10-CM | POA: Diagnosis not present

## 2020-04-08 DIAGNOSIS — Z7901 Long term (current) use of anticoagulants: Secondary | ICD-10-CM | POA: Insufficient documentation

## 2020-04-08 DIAGNOSIS — I2510 Atherosclerotic heart disease of native coronary artery without angina pectoris: Secondary | ICD-10-CM | POA: Insufficient documentation

## 2020-04-08 DIAGNOSIS — Z79899 Other long term (current) drug therapy: Secondary | ICD-10-CM | POA: Insufficient documentation

## 2020-04-08 DIAGNOSIS — I8002 Phlebitis and thrombophlebitis of superficial vessels of left lower extremity: Secondary | ICD-10-CM | POA: Diagnosis not present

## 2020-04-08 DIAGNOSIS — I5032 Chronic diastolic (congestive) heart failure: Secondary | ICD-10-CM | POA: Insufficient documentation

## 2020-04-08 HISTORY — PX: VEIN LIGATION AND STRIPPING: SHX2653

## 2020-04-08 LAB — POCT I-STAT, CHEM 8
BUN: 15 mg/dL (ref 8–23)
Calcium, Ion: 1.19 mmol/L (ref 1.15–1.40)
Chloride: 104 mmol/L (ref 98–111)
Creatinine, Ser: 1.3 mg/dL — ABNORMAL HIGH (ref 0.61–1.24)
Glucose, Bld: 99 mg/dL (ref 70–99)
HCT: 29 % — ABNORMAL LOW (ref 39.0–52.0)
Hemoglobin: 9.9 g/dL — ABNORMAL LOW (ref 13.0–17.0)
Potassium: 4.2 mmol/L (ref 3.5–5.1)
Sodium: 139 mmol/L (ref 135–145)
TCO2: 25 mmol/L (ref 22–32)

## 2020-04-08 LAB — BASIC METABOLIC PANEL
Anion gap: 8 (ref 5–15)
BUN: 12 mg/dL (ref 8–23)
CO2: 25 mmol/L (ref 22–32)
Calcium: 8.8 mg/dL — ABNORMAL LOW (ref 8.9–10.3)
Chloride: 105 mmol/L (ref 98–111)
Creatinine, Ser: 1.24 mg/dL (ref 0.61–1.24)
GFR calc Af Amer: >60 mL/min (ref >60)
GFR calc non Af Amer: 55 mL/min — ABNORMAL LOW (ref >60)
Glucose, Bld: 97 mg/dL (ref 70–99)
Potassium: 4.3 mmol/L (ref 3.5–5.1)
Sodium: 138 mmol/L (ref 135–145)

## 2020-04-08 LAB — CBC
HCT: 31.7 % — ABNORMAL LOW (ref 39.0–52.0)
Hemoglobin: 9 g/dL — ABNORMAL LOW (ref 13.0–17.0)
MCH: 21.2 pg — ABNORMAL LOW (ref 26.0–34.0)
MCHC: 28.4 g/dL — ABNORMAL LOW (ref 30.0–36.0)
MCV: 74.6 fL — ABNORMAL LOW (ref 80.0–100.0)
Platelets: 263 10*3/uL (ref 150–400)
RBC: 4.25 MIL/uL (ref 4.22–5.81)
RDW: 21.8 % — ABNORMAL HIGH (ref 11.5–15.5)
WBC: 5.7 10*3/uL (ref 4.0–10.5)
nRBC: 0 % (ref 0.0–0.2)

## 2020-04-08 SURGERY — LIGATION AND STRIPPING, VARICOSE VEIN
Anesthesia: Monitor Anesthesia Care | Site: Ankle | Laterality: Left

## 2020-04-08 MED ORDER — CHLORHEXIDINE GLUCONATE 4 % EX LIQD: 60.0000 mL | Freq: Once | CUTANEOUS | Status: DC

## 2020-04-08 MED ORDER — FENTANYL CITRATE (PF) 250 MCG/5ML IJ SOLN
INTRAMUSCULAR | Status: DC | PRN
Start: 2020-04-08 — End: 2020-04-08
  Administered 2020-04-08: 50 ug via INTRAVENOUS

## 2020-04-08 MED ORDER — FENTANYL CITRATE (PF) 250 MCG/5ML IJ SOLN
INTRAMUSCULAR | Status: AC
  Filled 2020-04-08: qty 5

## 2020-04-08 MED ORDER — DEXAMETHASONE SODIUM PHOSPHATE 10 MG/ML IJ SOLN
INTRAMUSCULAR | Status: DC | PRN
  Administered 2020-04-08: 10 mg via INTRAVENOUS

## 2020-04-08 MED ORDER — LIDOCAINE HCL (CARDIAC) PF 100 MG/5ML IV SOSY
PREFILLED_SYRINGE | INTRAVENOUS | Status: DC | PRN
  Administered 2020-04-08: 80 mg via INTRATRACHEAL

## 2020-04-08 MED ORDER — ONDANSETRON HCL 4 MG/2ML IJ SOLN
INTRAMUSCULAR | Status: DC | PRN
  Administered 2020-04-08: 4 mg via INTRAVENOUS

## 2020-04-08 MED ORDER — DEXAMETHASONE SODIUM PHOSPHATE 10 MG/ML IJ SOLN
INTRAMUSCULAR | Status: AC
  Filled 2020-04-08: qty 1

## 2020-04-08 MED ORDER — ORAL CARE MOUTH RINSE: 15.0000 mL | Freq: Once | OROMUCOSAL | Status: AC

## 2020-04-08 MED ORDER — ONDANSETRON HCL 4 MG/2ML IJ SOLN
INTRAMUSCULAR | Status: AC
  Filled 2020-04-08: qty 2

## 2020-04-08 MED ORDER — LACTATED RINGERS IV SOLN: INTRAVENOUS | Status: DC

## 2020-04-08 MED ORDER — BACITRACIN ZINC 500 UNIT/GM EX OINT
TOPICAL_OINTMENT | CUTANEOUS | Status: AC
  Filled 2020-04-08: qty 28.35

## 2020-04-08 MED ORDER — TRAMADOL HCL 50 MG PO TABS: 50.0000 mg | ORAL_TABLET | Freq: Four times a day (QID) | ORAL | 0 refills | Status: DC | PRN

## 2020-04-08 MED ORDER — SUCCINYLCHOLINE CHLORIDE 200 MG/10ML IV SOSY
PREFILLED_SYRINGE | INTRAVENOUS | Status: AC
  Filled 2020-04-08: qty 10

## 2020-04-08 MED ORDER — GLYCOPYRROLATE PF 0.2 MG/ML IJ SOSY
PREFILLED_SYRINGE | INTRAMUSCULAR | Status: AC
  Filled 2020-04-08: qty 1

## 2020-04-08 MED ORDER — CEFAZOLIN SODIUM-DEXTROSE 2-4 GM/100ML-% IV SOLN
2.0000 g | INTRAVENOUS | Status: AC
  Administered 2020-04-08: 2 g via INTRAVENOUS
  Filled 2020-04-08: qty 100

## 2020-04-08 MED ORDER — GLYCOPYRROLATE 0.2 MG/ML IJ SOLN
INTRAMUSCULAR | Status: DC | PRN
  Administered 2020-04-08: .2 mg via INTRAVENOUS

## 2020-04-08 MED ORDER — PROPOFOL 10 MG/ML IV BOLUS
INTRAVENOUS | Status: DC | PRN
  Administered 2020-04-08: 70 mg via INTRAVENOUS
  Administered 2020-04-08: 200 mg via INTRAVENOUS

## 2020-04-08 MED ORDER — CHLORHEXIDINE GLUCONATE 0.12 % MT SOLN
15.0000 mL | Freq: Once | OROMUCOSAL | Status: AC
  Administered 2020-04-08: 15 mL via OROMUCOSAL
  Filled 2020-04-08: qty 15

## 2020-04-08 MED ORDER — LIDOCAINE HCL (PF) 1 % IJ SOLN
INTRAMUSCULAR | Status: AC
  Filled 2020-04-08: qty 30

## 2020-04-08 MED ORDER — 0.9 % SODIUM CHLORIDE (POUR BTL) OPTIME
TOPICAL | Status: DC | PRN
  Administered 2020-04-08: 1000 mL

## 2020-04-08 MED ORDER — SUCCINYLCHOLINE CHLORIDE 20 MG/ML IJ SOLN
INTRAMUSCULAR | Status: DC | PRN
  Administered 2020-04-08: 120 mg via INTRAVENOUS

## 2020-04-08 MED ORDER — LIDOCAINE 2% (20 MG/ML) 5 ML SYRINGE
INTRAMUSCULAR | Status: AC
  Filled 2020-04-08: qty 5

## 2020-04-08 SURGICAL SUPPLY — 58 items
BAG ISOLATION DRAPE 18X18 (DRAPES) ×1 IMPLANT
BLADE SURG 11 STRL SS (BLADE) IMPLANT
BNDG COHESIVE 6X5 TAN STRL LF (GAUZE/BANDAGES/DRESSINGS) ×3 IMPLANT
BNDG ELASTIC 3X5.8 VLCR STR LF (GAUZE/BANDAGES/DRESSINGS) ×3 IMPLANT
BNDG ELASTIC 4X5.8 VLCR STR LF (GAUZE/BANDAGES/DRESSINGS) ×3 IMPLANT
BNDG ELASTIC 6X5.8 VLCR STR LF (GAUZE/BANDAGES/DRESSINGS) ×3 IMPLANT
BNDG GAUZE ELAST 4 BULKY (GAUZE/BANDAGES/DRESSINGS) ×3 IMPLANT
BNDG STRETCH 4X75 STRL LF (GAUZE/BANDAGES/DRESSINGS) ×3 IMPLANT
CANISTER SUCT 3000ML PPV (MISCELLANEOUS) ×3 IMPLANT
CLIP VESOCCLUDE MED 6/CT (CLIP) IMPLANT
CLIP VESOCCLUDE SM WIDE 24/CT (CLIP) IMPLANT
CLOSURE WOUND 1/2 X4 (GAUZE/BANDAGES/DRESSINGS)
COVER SURGICAL LIGHT HANDLE (MISCELLANEOUS) ×3 IMPLANT
COVER WAND RF STERILE (DRAPES) IMPLANT
DERMABOND ADVANCED (GAUZE/BANDAGES/DRESSINGS) ×2
DERMABOND ADVANCED .7 DNX12 (GAUZE/BANDAGES/DRESSINGS) ×1 IMPLANT
DRAPE EXTREMITY T 121X128X90 (DISPOSABLE) ×3 IMPLANT
DRAPE HALF SHEET 40X57 (DRAPES) ×3 IMPLANT
DRAPE INCISE IOBAN 66X45 STRL (DRAPES) ×3 IMPLANT
DRAPE ISOLATION BAG 18X18 (DRAPES) ×3
DRSG EMULSION OIL 3X3 NADH (GAUZE/BANDAGES/DRESSINGS) ×3 IMPLANT
ELECT REM PT RETURN 9FT ADLT (ELECTROSURGICAL) ×3
ELECTRODE REM PT RTRN 9FT ADLT (ELECTROSURGICAL) ×1 IMPLANT
GAUZE 4X4 16PLY RFD (DISPOSABLE) ×3 IMPLANT
GLOVE BIO SURGEON STRL SZ 6.5 (GLOVE) ×2 IMPLANT
GLOVE BIO SURGEON STRL SZ7.5 (GLOVE) ×3 IMPLANT
GLOVE BIO SURGEONS STRL SZ 6.5 (GLOVE) ×1
GLOVE BIOGEL PI IND STRL 6.5 (GLOVE) ×1 IMPLANT
GLOVE BIOGEL PI IND STRL 8 (GLOVE) ×1 IMPLANT
GLOVE BIOGEL PI INDICATOR 6.5 (GLOVE) ×2
GLOVE BIOGEL PI INDICATOR 8 (GLOVE) ×2
GLOVE SURG SS PI 6.5 STRL IVOR (GLOVE) ×3 IMPLANT
GOWN STRL REUS W/ TWL LRG LVL3 (GOWN DISPOSABLE) ×3 IMPLANT
GOWN STRL REUS W/TWL LRG LVL3 (GOWN DISPOSABLE) ×9
KIT BASIN OR (CUSTOM PROCEDURE TRAY) ×3 IMPLANT
KIT TURNOVER KIT B (KITS) ×3 IMPLANT
LOOP VESSEL MAXI BLUE (MISCELLANEOUS) IMPLANT
NS IRRIG 1000ML POUR BTL (IV SOLUTION) ×3 IMPLANT
PACK GENERAL/GYN (CUSTOM PROCEDURE TRAY) ×3 IMPLANT
PACK UNIVERSAL I (CUSTOM PROCEDURE TRAY) ×3 IMPLANT
PAD ARMBOARD 7.5X6 YLW CONV (MISCELLANEOUS) ×6 IMPLANT
STAPLER VISISTAT (STAPLE) IMPLANT
STRIP CLOSURE SKIN 1/2X4 (GAUZE/BANDAGES/DRESSINGS) IMPLANT
SUT ETHILON 3 0 PS 1 (SUTURE) ×3 IMPLANT
SUT SILK 2 0 (SUTURE)
SUT SILK 2 0 SH (SUTURE) IMPLANT
SUT SILK 2-0 18XBRD TIE 12 (SUTURE) IMPLANT
SUT SILK 3 0 (SUTURE)
SUT SILK 3-0 18XBRD TIE 12 (SUTURE) IMPLANT
SUT SILK 4 0 (SUTURE)
SUT SILK 4-0 18XBRD TIE 12 (SUTURE) IMPLANT
SUT VIC AB 3-0 SH 27 (SUTURE) ×3
SUT VIC AB 3-0 SH 27X BRD (SUTURE) ×1 IMPLANT
SUT VICRYL 4-0 PS2 18IN ABS (SUTURE) ×3 IMPLANT
TOWEL GREEN STERILE (TOWEL DISPOSABLE) ×3 IMPLANT
TOWEL GREEN STERILE FF (TOWEL DISPOSABLE) ×3 IMPLANT
UNDERPAD 30X36 HEAVY ABSORB (UNDERPADS AND DIAPERS) ×3 IMPLANT
WATER STERILE IRR 1000ML POUR (IV SOLUTION) ×3 IMPLANT

## 2020-04-08 NOTE — Transfer of Care (Signed)
Immediate Anesthesia Transfer of Care Note  Patient: [name]  Procedure(s) Performed: LEFT LOWER EXTREMITY VEIN LIGATION AND EXCISION OF ULCER (Left Ankle)  Patient Location: PACU  Anesthesia Type:General  Level of Consciousness: drowsy and patient cooperative  Airway & Oxygen Therapy: Patient Spontanous Breathing and Patient connected to face mask oxygen  Post-op Assessment: Report given to RN and Post -op Vital signs reviewed and stable  Post vital signs: Reviewed and stable  Last Vitals:  Vitals Value Taken Time  BP 150/75 04/08/20 1044  Temp    Pulse 51 04/08/20 1045  Resp 18 04/08/20 1045  SpO2 100 % 04/08/20 1045  Vitals shown include unvalidated device data.  Last Pain:  Vitals:   04/08/20 0848  TempSrc:   PainSc: 0-No pain      Patients Stated Pain Goal: 5 (60/47/99 8721)  Complications: No complications documented.

## 2020-04-08 NOTE — Anesthesia Postprocedure Evaluation (Signed)
Anesthesia Post Note  Patient: [name]  Procedure(s) Performed: LEFT LOWER EXTREMITY VEIN LIGATION AND EXCISION OF ULCER (Left Ankle)     Patient location during evaluation: PACU Anesthesia Type: MAC Pain management: pain level controlled Vital Signs Assessment: post-procedure vital signs reviewed and stable Respiratory status: spontaneous breathing Cardiovascular status: stable Postop Assessment: no apparent nausea or vomiting Anesthetic complications: no   No complications documented.  Last Vitals:  Vitals:   04/08/20 1100 04/08/20 1115  BP: 136/81 (!) 153/89  Pulse: (!) 50 (!) 51  Resp: 13 12  Temp:  (!) 36.2 C  SpO2: 100% 100%    Last Pain:  Vitals:   04/08/20 1115  TempSrc:   PainSc: 3                  Mariyana Fulop

## 2020-04-08 NOTE — Anesthesia Preprocedure Evaluation (Addendum)
Anesthesia Evaluation  Patient identified by MRN, date of birth, ID band Patient awake    Reviewed: Allergy & Precautions, NPO status , Patient's Chart, lab work & pertinent test results  Airway Mallampati: II  TM Distance: >3 FB     Dental   Pulmonary neg pulmonary ROS,    breath sounds clear to auscultation       Cardiovascular hypertension, + CAD and +CHF   Rhythm:Regular Rate:Normal     Neuro/Psych negative neurological ROS     GI/Hepatic negative GI ROS, Neg liver ROS,   Endo/Other  negative endocrine ROS  Renal/GU Renal disease     Musculoskeletal  (+) Arthritis ,   Abdominal   Peds  Hematology   Anesthesia Other Findings   Reproductive/Obstetrics                             Anesthesia Physical Anesthesia Plan  ASA: III  Anesthesia Plan: MAC   Post-op Pain Management:    Induction: Intravenous  PONV Risk Score and Plan: 2 and Ondansetron, Dexamethasone and Midazolam  Airway Management Planned: Nasal Cannula and Simple Face Mask  Additional Equipment:   Intra-op Plan:   Post-operative Plan:   Informed Consent: I have reviewed the patients History and Physical, chart, labs and discussed the procedure including the risks, benefits and alternatives for the proposed anesthesia with the patient or authorized representative who has indicated his/her understanding and acceptance.     Dental advisory given  Plan Discussed with: Anesthesiologist and CRNA  Anesthesia Plan Comments:         Anesthesia Quick Evaluation

## 2020-04-08 NOTE — Op Note (Signed)
    NAME: [name]    MRN: [id number] DOB: 1940-08-29    DATE OF OPERATION: 04/08/2020  PREOP DIAGNOSIS:    Recurrent bleeding from ulceration left leg  POSTOP DIAGNOSIS:    Same  PROCEDURE:    Excision of ulceration left leg for recurrent bleeding Ligation of underlying perforator Excision of small subcutaneous varicose veins  SURGEON: Judeth Cornfield. Scot Dock, MD  ASSIST: None  ANESTHESIA: General  EBL: Minimal  INDICATIONS:    [name] is a 79 y.o. male who presented with recurrent bleeding episodes from the left leg.  This has been happening for over a year.  He has required transportation to the emergency department for bleeding via EMS because of these episodes.  Based on previous noninvasive studies the patient did not have any correctable proximal venous reflux.  Based on my assessment with the SonoSite he had an underlying small perforator.  The patient did have sclerotherapy on 11/17/2019 of this area but ultimately had recurrent bleeding.  I felt the only remaining option would be to excise the small ulcer and address the underlying perforator.  FINDINGS:   Multiple small subcutaneous varicose veins underlying the ulceration.  TECHNIQUE:   Cath the patient was taken to the operating room and received a general anesthetic.  The left leg was prepped and draped in usual sterile fashion.  I made an elliptical incision longitudinally over the ulceration.  The length of the incision was approximately 3 times the width.  I excised the ulceration and the overlying skin.  Beneath this were multiple small subcutaneous varicose veins.  These were excised and the underlying perforator was identified and ligated with 3-0 silk tie.  Cauterized for hemostasis.  The wound was irrigated and then closed with interrupted 3-0 nylon sutures.  A sterile dressing was applied with mild compression.  The patient tolerated procedure well was transferred to recovery room in stable  condition.  All needle and sponge counts were correct.   [name], MD, FACS [facility]  DATE OF DICTATION:   04/08/2020

## 2020-04-08 NOTE — Interval H&P Note (Signed)
History and Physical Interval Note:  04/08/2020 8:57 AM  [name]  has presented today for surgery, with the diagnosis of VARICOSE VEINS OF LEFT LOWER EXTREMITY WITH OTHER COMPLICATIONS.  The various methods of treatment have been discussed with the patient and family. After consideration of risks, benefits and other options for treatment, the patient has consented to  Procedure(s): LEFT LOWER EXTREMITY VEIN LIGATION AND EXCISION OF ULCER (Left) as a surgical intervention.  The patient's history has been reviewed, patient examined, no change in status, stable for surgery.  I have reviewed the patient's chart and labs.  Questions were answered to the patient's satisfaction.     [name]

## 2020-04-08 NOTE — Anesthesia Procedure Notes (Signed)
Procedure Name: Intubation Date/Time: 04/08/2020 10:18 AM Performed by: Kathryne Hitch, CRNA Pre-anesthesia Checklist: Patient identified, Emergency Drugs available, Suction available and Patient being monitored Patient Re-evaluated:Patient Re-evaluated prior to induction Oxygen Delivery Method: Circle system utilized Preoxygenation: Pre-oxygenation with 100% oxygen Induction Type: IV induction Ventilation: Mask ventilation without difficulty Laryngoscope Size: Miller and 2 Grade View: Grade I Tube type: Oral Tube size: 7.5 mm Number of attempts: 1 Airway Equipment and Method: Stylet and Oral airway Placement Confirmation: ETT inserted through vocal cords under direct vision,  positive ETCO2 and breath sounds checked- equal and bilateral Secured at: 22 cm Tube secured with: Tape Dental Injury: Teeth and Oropharynx as per pre-operative assessment

## 2020-04-09 ENCOUNTER — Telehealth: Payer: Self-pay | Admitting: *Deleted

## 2020-04-09 ENCOUNTER — Encounter (HOSPITAL_COMMUNITY): Payer: Self-pay | Admitting: Vascular Surgery

## 2020-04-09 NOTE — Telephone Encounter (Signed)
Patient called and had questions regarding incision care . Reviewed post op discharge instructions with patient . Patient verbalized understanding.

## 2020-04-11 ENCOUNTER — Other Ambulatory Visit: Payer: Self-pay

## 2020-04-11 ENCOUNTER — Other Ambulatory Visit: Payer: Self-pay | Admitting: Cardiology

## 2020-04-11 ENCOUNTER — Telehealth: Payer: Self-pay | Admitting: Cardiology

## 2020-04-11 MED ORDER — FUROSEMIDE 20 MG PO TABS: 20.0000 mg | ORAL_TABLET | Freq: Every evening | ORAL | 3 refills | Status: DC

## 2020-04-11 NOTE — Telephone Encounter (Signed)
furosemide (LASIX) 20 MG tablet 90 tablet 3 04/11/2020 04/11/2021   Sig - Route: Take 1 tablet (20 mg total) by mouth every evening. - Oral   Sent to pharmacy as: furosemide (LASIX) 20 MG tablet   E-Prescribing Status: Receipt confirmed by pharmacy (04/11/2020  3:08 PM EDT)   Pharmacy  [id number] - [place], [place]

## 2020-04-11 NOTE — Telephone Encounter (Signed)
°*  STAT* If patient is at the pharmacy, call can be transferred to refill team.   1. Which medications need to be refilled? (please list name of each medication and dose if known)  furosemide (LASIX) 20 MG tablet  2. Which pharmacy/location (including street and city if local pharmacy) is medication to be sent to? [id number] - [place], [place] - [address]  3. Do they need a 30 day or 90 day supply? [place]

## 2020-04-11 NOTE — Telephone Encounter (Signed)
Patient returning call .  Verified furosemide is 20 mg po q d

## 2020-04-11 NOTE — Telephone Encounter (Signed)
Called patient. No answer. No voicemail.   I want wanting to confirm patients currently dose of Lasix.  Per last office note 09/2019 with Fransico Him - "continue lasix 40mg  daily" Pharmacy and patients medicine list states " lasix 20MG  daily"

## 2020-04-18 ENCOUNTER — Encounter: Payer: Self-pay | Admitting: Vascular Surgery

## 2020-04-18 ENCOUNTER — Other Ambulatory Visit: Payer: Self-pay | Admitting: Cardiology

## 2020-04-18 ENCOUNTER — Ambulatory Visit (INDEPENDENT_AMBULATORY_CARE_PROVIDER_SITE_OTHER): Payer: Self-pay | Admitting: Vascular Surgery

## 2020-04-18 ENCOUNTER — Other Ambulatory Visit: Payer: Self-pay

## 2020-04-18 VITALS — BP 147/68 | HR 90 | Temp 97.7°F | Resp 16 | Ht 74.0 in | Wt 150.0 lb

## 2020-04-18 DIAGNOSIS — I872 Venous insufficiency (chronic) (peripheral): Secondary | ICD-10-CM

## 2020-04-18 DIAGNOSIS — I83892 Varicose veins of left lower extremities with other complications: Secondary | ICD-10-CM

## 2020-04-18 NOTE — Progress Notes (Signed)
Patient name: [name] MRN: [id number] DOB: 1940-10-08 Sex: male  REASON FOR VISIT:   Follow-up after excision of ulceration left leg and ligation of underlying perforator.  HPI:   [name] is a pleasant 79 y.o. male who presented with recurrent bleeding episodes from the left leg.  This has been happening for over a year.  He had required transportation emergency department for bleeding via EMS on some of these episodes.  Based on previous noninvasive studies the patient did not have any correctable proximal venous disease.  I looked with the SonoSite and appear to have an underlying small perforator.  He did have sclerotherapy on 11/17/2019 but this patient ultimately had recurrent bleeding episode.  I felt the only remaining option was to excise the ulcer and try to address the underlying perforator.  On 04/08/2020 he underwent excision of the ulcer of the left leg with ligation of the underlying perforator and excision of small varicose veins there was subcutaneous.  He is doing well and has no specific complaints.  Current Outpatient Medications  Medication Sig Dispense Refill  . amiodarone (PACERONE) 200 MG tablet TAKE 1 TABLET BY MOUTH EVERY DAY (Patient taking differently: Take 200 mg by mouth every evening. ) 90 tablet 3  . atorvastatin (LIPITOR) 20 MG tablet TAKE 1 TABLET BY MOUTH EVERY DAY (Patient taking differently: Take 20 mg by mouth every evening. ) 90 tablet 1  . Coenzyme Q10 (COQ10 PO) Take 1 capsule by mouth every evening.    Marland Kitchen ELIQUIS 5 MG TABS tablet TAKE 1 TABLET BY MOUTH TWICE A DAY 180 tablet 1  . furosemide (LASIX) 20 MG tablet Take 1 tablet (20 mg total) by mouth every evening. 90 tablet 3  . Glucosamine-Chondroitin (COSAMIN DS PO) Take 1 tablet by mouth daily.    . Magnesium 400 MG CAPS Take 400 mg by mouth every Monday, Tuesday, Wednesday, Thursday, and Friday.     . Multiple Vitamin (MULTIVITAMIN WITH MINERALS) TABS tablet Take 1 tablet by mouth in  the morning and at bedtime.    . nitroGLYCERIN (NITROSTAT) 0.4 MG SL tablet Place 1 tablet (0.4 mg total) under the tongue every 5 (five) minutes as needed for chest pain. (Patient taking differently: Place 0.4 mg under the tongue every 5 (five) minutes x 3 doses as needed for chest pain. ) 25 tablet 3  . Omega-3 Fatty Acids (FISH OIL PO) Take 1 capsule by mouth daily.    . pindolol (VISKEN) 10 MG tablet Take 0.5 tablets (5 mg total) by mouth 2 (two) times daily. 90 tablet 3  . traMADol (ULTRAM) 50 MG tablet Take 1 tablet (50 mg total) by mouth every 6 (six) hours as needed. 12 tablet 0  . Zinc 50 MG CAPS Take 100 mg by mouth 3 (three) times a week.      No current facility-administered medications for this visit.    REVIEW OF SYSTEMS:  [X]  denotes positive finding, [ ]  denotes negative finding Vascular    Leg swelling    Cardiac    Chest pain or chest pressure:    Shortness of breath upon exertion:    Short of breath when lying flat:    Irregular heart rhythm:    Constitutional    Fever or chills:     PHYSICAL EXAM:   Vitals:   04/18/20 1307  BP: (!) 147/68  Pulse: 90  Resp: 16  Temp: 97.7 F (36.5 C)  TempSrc: Temporal  SpO2: 95%  Weight: 150 lb (68 kg)  Height: 6\' 2"  (1.88 m)    GENERAL: The patient is a well-nourished male, in no acute distress. The vital signs are documented above. CARDIOVASCULAR: There is a regular rate and rhythm. PULMONARY: There is good air exchange bilaterally without wheezing or rales. VASCULAR: His incision on the lateral left leg is healing well as documented below I removed his sutures today and placed a Steri-Strip.     DATA:   No new data  MEDICAL ISSUES:   CHRONIC VENOUS INSUFFICIENCY: The patient underwent excision of an ulceration that had bled recurrently and ligation of an underlying perforator.  This is healing adequately and I removed the sutures today.  We have again discussed the importance of intermittent leg elevation and  the proper positioning for this.  He knows to wear his compression stockings when he is on his feet a lot.  We also discussed the importance of exercise.  Most importantly in the winter I encouraged him to keep his skin well lubricated.  I will see him as needed.  [name] [facility] [phone]

## 2020-04-18 NOTE — Telephone Encounter (Signed)
Eliquis 5mg  refill request received. Patient is 79 years old, weight-68kg, Crea-1.30 on 04/08/2020, Diagnosis-Afib, and last seen by Dr. Radford Pax on 10/11/2019. Dose is appropriate based on dosing criteria. Will send in refill to requested pharmacy.

## 2020-04-23 ENCOUNTER — Ambulatory Visit: Payer: Medicare Other

## 2020-05-23 ENCOUNTER — Encounter: Payer: Medicare Other | Admitting: Vascular Surgery

## 2020-05-24 ENCOUNTER — Ambulatory Visit
Admission: RE | Admit: 2020-05-24 | Discharge: 2020-05-24 | Disposition: A | Discharge status: Home / Self Care | Payer: Medicare Other | Source: Ambulatory Visit | Attending: Cardiology | Admitting: Cardiology

## 2020-05-24 ENCOUNTER — Other Ambulatory Visit: Payer: Self-pay

## 2020-05-24 DIAGNOSIS — I712 Thoracic aortic aneurysm, without rupture, unspecified: Secondary | ICD-10-CM

## 2020-05-24 MED ORDER — IOPAMIDOL (ISOVUE-370) INJECTION 76%
75.0000 mL | Freq: Once | INTRAVENOUS | Status: AC | PRN
  Administered 2020-05-24: 75 mL via INTRAVENOUS

## 2020-05-30 ENCOUNTER — Telehealth: Payer: Self-pay | Admitting: Cardiology

## 2020-05-30 NOTE — Telephone Encounter (Signed)
[name] is returning [place] call in regards to his results. Please advise

## 2020-05-31 ENCOUNTER — Telehealth: Payer: Self-pay

## 2020-05-31 DIAGNOSIS — I77810 Thoracic aortic ectasia: Secondary | ICD-10-CM

## 2020-05-31 NOTE — Telephone Encounter (Signed)
-----   Message from Sueanne Margarita, MD sent at 05/30/2020  1:24 PM EST ----- Chest CTA showed an increase in dimension of aortic aneurysm from 44 to 51mm.  Please refer to Dr. Cyndia Bent for evaulation

## 2020-05-31 NOTE — Telephone Encounter (Signed)
The patient has been notified of the result and verbalized understanding.  All questions (if any) were answered. Antonieta Iba, RN 05/31/2020 11:19 AM  Referral has been placed for the patient to see Dr. Cyndia Bent.

## 2020-05-31 NOTE — Telephone Encounter (Signed)
Please see above phone note.

## 2020-06-03 ENCOUNTER — Telehealth: Payer: Self-pay | Admitting: Cardiology

## 2020-06-03 NOTE — Telephone Encounter (Signed)
Follow Up:    Returning [name]'s cal from last week. Pt says he have some questions about his test results.

## 2020-06-03 NOTE — Telephone Encounter (Signed)
I spoke with patient and reviewed recent CT results with him and reason for referral to Dr Cyndia Bent

## 2020-06-06 ENCOUNTER — Telehealth: Payer: Self-pay | Admitting: Cardiology

## 2020-06-06 NOTE — Telephone Encounter (Signed)
Patient states he has a question about his last test and would like Carly to call him.

## 2020-06-06 NOTE — Telephone Encounter (Signed)
Attempted to call patient. Unable to leave voicemail.  

## 2020-06-07 NOTE — Telephone Encounter (Signed)
Spoke with the patient and answered his questions regarding CT scan findings and referral to Dr. Cyndia Bent. Patient verbalized understanding.

## 2020-06-19 ENCOUNTER — Encounter: Payer: Self-pay | Admitting: Surgery

## 2020-06-19 ENCOUNTER — Other Ambulatory Visit: Payer: Self-pay

## 2020-06-19 ENCOUNTER — Institutional Professional Consult (permissible substitution) (INDEPENDENT_AMBULATORY_CARE_PROVIDER_SITE_OTHER): Payer: Medicare Other | Admitting: Surgery

## 2020-06-19 VITALS — BP 145/63 | HR 65 | Resp 18 | Ht 74.0 in | Wt 155.0 lb

## 2020-06-19 DIAGNOSIS — I2510 Atherosclerotic heart disease of native coronary artery without angina pectoris: Secondary | ICD-10-CM | POA: Diagnosis not present

## 2020-06-19 DIAGNOSIS — I712 Thoracic aortic aneurysm, without rupture, unspecified: Secondary | ICD-10-CM

## 2020-06-19 NOTE — Progress Notes (Signed)
Cardiothoracic Surgery Consultation  PCP is Seward Carol, MD Referring Provider is Sueanne Margarita, MD  Chief Complaint  Patient presents with  . Thoracic Aortic Aneurysm    Initial surgical consult, CTA chest 11/7  . Consult    HPI:  The patient is an 79 year old gentleman with history of hypertension, paroxysmal atrial fibrillation, chronic venous insufficiency, coronary artery disease status post stenting in the past, chronic diastolic congestive heart failure, and known ascending aortic aneurysm which has been followed by Dr. Radford Pax since 2019.  CTA of the chest on 05/15/2019 showed a maximum diameter of the ascending thoracic aorta to be 4.4 cm.  He had a repeat CTA of the chest on 05/24/2020 which was read by radiology as having a maximum diameter of 4.7 cm.  He was therefore sent for surgical evaluation.  He is here today with his daughter.  There is no history of connective tissue disorder.  There is no family history of aortic aneurysm or aortic dissection.  He denies any chest or back pain. Past Medical History:  Diagnosis Date  . Ascending aorta dilatation (HCC)    15mm by chest CT angio 04/2018  . CAD (coronary artery disease)    a. 05/2006 Abnl stress test w/ 2-67mm ST dep; b. 05/2006 Cath/PCI: LM nl, LAD 30-40ost, 20-30p, 90d (small), D1 nl, D2 nl, LCX nl, OM1/2 nl, RCA Ca2+, 68m/d (3.5x16 [place] BMS & 3.5x12 Liberty BMS), EF 50%.  . Chronic diastolic CHF (congestive heart failure) ([place])    a. 10/2017 Echo: EF 50-55%, mild AI, sev dil LA, mod dil RA.  Marland Kitchen Chronic venous insufficiency   . HTN (hypertension)   . PAF (paroxysmal atrial fibrillation) ([place])    a. CHA2DS2VASc 5-->Eliquis; b. Prev WCT on flecainide;  c. 12/2017 s/p DCCV.  Marland Kitchen Pneumothorax   . Ventricular tachycardia (HCC)    a. possibly proarrhythmia from flecainide.    Past Surgical History:  Procedure Laterality Date  . CARDIOVERSION N/A 12/23/2017   Procedure: CARDIOVERSION;  Surgeon: Sueanne Margarita, MD;   Location: [facility] ENDOSCOPY;  Service: Cardiovascular;  Laterality: N/A;  . CARDIOVERSION N/A 03/08/2018   Procedure: CARDIOVERSION;  Surgeon: Sueanne Margarita, MD;  Location: [facility] ENDOSCOPY;  Service: Cardiovascular;  Laterality: N/A;  . INGUINAL HERNIA REPAIR     x2  . TEE WITHOUT CARDIOVERSION N/A 03/08/2018   Procedure: TRANSESOPHAGEAL ECHOCARDIOGRAM (TEE);  Surgeon: Sueanne Margarita, MD;  Location: [facility] ENDOSCOPY;  Service: Cardiovascular;  Laterality: N/A;  . TOTAL KNEE ARTHROPLASTY Left 02/28/2018   Procedure: LEFT TOTAL KNEE ARTHROPLASTY;  Surgeon: Gaynelle Arabian, MD;  Location: [facility] ORS;  Service: Orthopedics;  Laterality: Left;  Marland Kitchen VEIN LIGATION AND STRIPPING Left 04/08/2020   Procedure: LEFT LOWER EXTREMITY VEIN LIGATION AND EXCISION OF ULCER;  Surgeon: Angelia Mould, MD;  Location: [facility] OR;  Service: Vascular;  Laterality: Left;    Family History  Problem Relation Age of Onset  . Coronary artery disease Other     Social History Social History   Tobacco Use  . Smoking status: Never Smoker  . Smokeless tobacco: Never Used  Vaping Use  . Vaping Use: Never used  Substance Use Topics  . Alcohol use: No    Comment: occsaoinal  . Drug use: Yes    Types: Other-see comments    Current Outpatient Medications  Medication Sig Dispense Refill  . amiodarone (PACERONE) 200 MG tablet TAKE 1 TABLET BY MOUTH EVERY DAY (Patient taking differently: Take 200 mg by mouth every evening. )  90 tablet 3  . atorvastatin (LIPITOR) 20 MG tablet TAKE 1 TABLET BY MOUTH EVERY DAY (Patient taking differently: Take 20 mg by mouth every evening. ) 90 tablet 1  . Coenzyme Q10 (COQ10 PO) Take 1 capsule by mouth every evening.    Marland Kitchen ELIQUIS 5 MG TABS tablet TAKE 1 TABLET BY MOUTH TWICE A DAY 180 tablet 1  . furosemide (LASIX) 20 MG tablet Take 1 tablet (20 mg total) by mouth every evening. 90 tablet 3  . Glucosamine-Chondroitin (COSAMIN DS PO) Take 1 tablet by mouth daily.    . Magnesium 400 MG CAPS Take 400 mg  by mouth every Monday, Tuesday, Wednesday, Thursday, and Friday.     . Multiple Vitamin (MULTIVITAMIN WITH MINERALS) TABS tablet Take 1 tablet by mouth in the morning and at bedtime.    . Omega-3 Fatty Acids (FISH OIL PO) Take 1 capsule by mouth daily.    . pindolol (VISKEN) 10 MG tablet Take 0.5 tablets (5 mg total) by mouth 2 (two) times daily. 90 tablet 3  . Zinc 50 MG CAPS Take 100 mg by mouth 3 (three) times a week.     . nitroGLYCERIN (NITROSTAT) 0.4 MG SL tablet Place 1 tablet (0.4 mg total) under the tongue every 5 (five) minutes as needed for chest pain. (Patient not taking: Reported on 06/19/2020) 25 tablet 3  . traMADol (ULTRAM) 50 MG tablet Take 1 tablet (50 mg total) by mouth every 6 (six) hours as needed. (Patient not taking: Reported on 06/19/2020) 12 tablet 0   No current facility-administered medications for this visit.    No Known Allergies  Review of Systems  Constitutional: Negative.   HENT: Negative.   Eyes: Negative.   Respiratory: Negative.   Cardiovascular: Negative.   Gastrointestinal: Negative.   Endocrine: Negative.   Genitourinary: Negative.   Musculoskeletal: Positive for arthralgias, joint swelling and myalgias.       Difficulty walking  Skin: Negative.   Allergic/Immunologic: Negative.   Neurological: Negative.   Psychiatric/Behavioral: Negative.     BP (!) 145/63 (BP Location: Left Arm, Patient Position: Sitting)   Pulse 65   Resp 18   Ht 6\' 2"  (1.88 m)   Wt 155 lb (70.3 kg)   SpO2 98% Comment: RA with mask on  BMI 19.90 kg/m  Physical Exam Constitutional:      Appearance: Normal appearance. He is normal weight.  HENT:     Head: Normocephalic and atraumatic.  Eyes:     Extraocular Movements: Extraocular movements intact.     Conjunctiva/sclera: Conjunctivae normal.     Pupils: Pupils are equal, round, and reactive to light.  Cardiovascular:     Rate and Rhythm: Normal rate and regular rhythm.     Pulses: Normal pulses.     Heart sounds:  Normal heart sounds. No murmur heard.   Pulmonary:     Effort: Pulmonary effort is normal.     Breath sounds: Normal breath sounds.  Abdominal:     General: Abdomen is flat.     Palpations: Abdomen is soft.  Musculoskeletal:        General: No swelling. Normal range of motion.  Skin:    General: Skin is warm and dry.  Neurological:     General: No focal deficit present.     Mental Status: He is alert and oriented to person, place, and time.  Psychiatric:        Mood and Affect: Mood normal.  Behavior: Behavior normal.        Thought Content: Thought content normal.        Judgment: Judgment normal.      Diagnostic Tests:  Narrative & Impression  CLINICAL DATA:  Thoracic aortic aneurysm  EXAM: CT ANGIOGRAPHY CHEST WITH CONTRAST  TECHNIQUE: Multidetector CT imaging of the chest was performed using the standard protocol during bolus administration of intravenous contrast. Multiplanar CT image reconstructions and MIPs were obtained to evaluate the vascular anatomy.  CONTRAST:  70mL ISOVUE-370 IOPAMIDOL (ISOVUE-370) INJECTION 76%  COMPARISON:  05/15/2019  FINDINGS: Cardiovascular: There is slight progression of fusiform aneurysmal dilatation of the tubular ascending thoracic aorta now measuring 4.7 cm in greatest dimension, previously 4.4 cm. Remainder of the thoracic aorta is stable in appearance and diameter. Atherosclerotic changes noted. Patent 3 vessel arch anatomy. No acute dissection, mediastinal hemorrhage or hematoma. Stable cardiomegaly. No pericardial effusion. Native coronary atherosclerosis.  Mediastinum/Nodes: No enlarged mediastinal, hilar, or axillary lymph nodes. Thyroid gland, trachea, and esophagus demonstrate no significant findings.  Lungs/Pleura: Lungs are clear. No pleural effusion or pneumothorax.  Upper Abdomen: Stable 3 cm right adrenal mass. Chronic stable left renal atrophy cystic change. No acute obstruction or  hydronephrosis.  Stable 1 cm hyperenhancing focus in the right hepatic dome compared to 2019. Suspect flash filling hemangioma versus transient hepatic attenuation defect.  Musculoskeletal: Scoliosis and degenerative changes noted of the spine. Pectus deformity of the anterior chest as before. No chest wall soft tissue asymmetry or hematoma. No severe compression fracture. Intact sternum.  Review of the MIP images confirms the above findings.  IMPRESSION: Slight progression of fusiform aneurysmal dilatation of the ascending thoracic aorta, maximal diameter 4.7 cm, previously 4.4 cm.  Ascending thoracic aortic aneurysm. Recommend semi-annual imaging followup by CTA or MRA and referral to cardiothoracic surgery if not already obtained. This recommendation follows 2010 ACCF/AHA/AATS/ACR/ASA/SCA/SCAI/SIR/STS/SVM Guidelines for the Diagnosis and Management of Patients With Thoracic Aortic Disease. Circulation. 2010; 121: T732-K025. Aortic aneurysm NOS (ICD10-I71.9)  Native coronary atherosclerosis  Aortic Atherosclerosis (ICD10-I70.0).  Aortic aneurysm NOS (ICD10-I71.9).   Electronically Signed   By: Jerilynn Mages.  Shick M.D.   On: 05/26/2020 10:58    Transesophageal Echocardiography with Cardioversion   Patient:  Baruc, Tugwell  MR #:    [id number]  Study Date: 03/08/2018  Gender:   M  Age:    31  Height:   188 cm  Weight:   69.1 kg  BSA:    1.89 m^2  Pt. Status:  Room:    Concepcion, MD  PERFORMING  Fransico Him, MD  REFERRING  Fransico Him, MD  ADMITTING  Aluisio, Evans, [place]  SONOGRAPHER Haroldine Laws   cc: Dr. Kirk Ruths   -------------------------------------------------------------------  LV EF: 55% -  60%   -------------------------------------------------------------------  Indications:   Atrial fibrillation - 427.31.    -------------------------------------------------------------------  History:  PMH:  Atrial fibrillation. Coronary artery disease.  Congestive heart failure. Risk factors: Hypertension.   -------------------------------------------------------------------  Study Conclusions   - Left ventricle: The cavity size was normal. The estimated  ejection fraction was in the range of 55% to 60%. Wall motion was  normal; there were no regional wall motion abnormalities. No  evidence of thrombus.  - Aortic valve: There was mild regurgitation.  - Mitral valve: No evidence of vegetation. There was mild to  moderate regurgitation, with multiple jets directed centrally.  - Left atrium: The atrium was moderately to  severely dilated. No  evidence of thrombus in the atrial cavity or appendage. There was  mildintermittent spontaneous echo contrast (&quot;smoke&quot;) in the  cavity and the appendage. The appendage was well visualized,  morphologically a left appendage, and of normal size. Emptying  velocity was moderately reduced.  - Right atrium: The atrium was moderately dilated. There was  spontaneous echo contrast (&quot;smoke&quot;).  - Atrial septum: No defect or patent foramen ovale was identified.  Echo contrast study showed no right-to-left atrial level shunt,  following an increase in RA pressure induced by provocative  maneuvers.  - Tricuspid valve: No evidence of vegetation.  - Pulmonic valve: No evidence of vegetation.  - Pericardium, extracardiac: A trivial, free-flowing pericardial  effusion was identified along the right atrial free wall. The  fluid had no internal echoes.   Impressions:   - Successful cardioversion. No cardiac source of emboli was  indentified.   Recommendations:   1. Continue antiarrhythmic therapy.  2. Continue anticoagulation.   -------------------------------------------------------------------  Study data:  Study  status: Routine. Consent: The risks,  benefits, and alternatives to the procedure were explained to the  patient and informed consent was obtained. Procedure: The patient  reported no pain pre or post test. Initial setup. The patient was  brought to the laboratory in the fasting state. A baseline ECG was  recorded. Intravenous access was obtained. Surface ECG leads and  pulse oximetric signals were monitored. Self-adhesive  anterior-posterior defibrillation pads were applied. Sedation.  Moderate sedation with intermittent deep sedation was administered  during cardioversion by anesthesiology staff. Transesophageal  echocardiography. Topical anesthesia was obtained using viscous  lidocaine. An adult multiplane transesophageal probe was inserted  by the attending cardiologistwithout difficulty. Image quality was  excellent. Images were captured in a quad screen format to simplify  data comparison. No intracardiac thrombus was identified.  Cardioversion. The rhythm was successfully converted from atrial  fibrillation to normal sinus rhythm, using 1synchronized shocks.  Study completion: All IVs inserted during the procedure were  removed. The patient tolerated the procedure well. There were no  complications. Administered medications:  Propofol.  Transesophageal echocardiography with cardioversion. 2D and  intravenous contrast injection. Birthdate: Patient birthdate:  Dec 07, 1940. Age: Patient is 79 yr old. Sex: Gender: male.  BMI: 19.6 kg/m^2. Blood pressure:   141/83 Patient status:  Inpatient. Study date: Study date: 03/08/2018. Study time: 01:39  PM. Location: Endoscopy.   -------------------------------------------------------------------   -------------------------------------------------------------------  Left ventricle: The cavity size was normal. The estimated ejection  fraction was in the range of 55% to 60%. Wall motion was normal;  there were no  regional wall motion abnormalities. No evidence of  thrombus.   -------------------------------------------------------------------  Aortic valve:  Moderately thickened, mildly calcified leaflets.  Doppler: There was mild regurgitation.   -------------------------------------------------------------------  Aorta: The aorta was mildly calcified.   -------------------------------------------------------------------  Mitral valve:  Structurally normal valve.  Leaflet separation was  normal. No evidence of vegetation. Doppler: There was mild to  moderate regurgitation, with multiple jets directed centrally.   -------------------------------------------------------------------  Left atrium: The atrium was moderately to severely dilated. No  evidence of thrombus in the atrial cavity or appendage. There was  mildintermittent spontaneous echo contrast (&quot;smoke&quot;) in the  cavity  and the appendage. The appendage was well visualized,  morphologically a left appendage, and of normal size. Emptying  velocity was moderately reduced.   -------------------------------------------------------------------  Atrial septum: No defect or patent foramen ovale was identified.  Echo contrast study showed no right-to-left atrial level shunt,  following an increase in RA pressure induced by provocative  maneuvers.   -------------------------------------------------------------------  Right ventricle: The cavity size was normal. Wall thickness was  normal. Systolic function was normal.   -------------------------------------------------------------------  Pulmonic valve:  Structurally normal valve.  Cusp separation was  normal. No evidence of vegetation.   -------------------------------------------------------------------  Tricuspid valve:  Structurally normal valve.  Leaflet separation  was normal. No evidence of vegetation. Doppler: There was mild  regurgitation.    -------------------------------------------------------------------  Right atrium: The atrium was moderately dilated. There was  spontaneous echo contrast (&quot;smoke&quot;).   -------------------------------------------------------------------  Pericardium: A trivial, free-flowing pericardial effusion was  identified along the right atrial free wall. The fluid had no  internal echoes.   -------------------------------------------------------------------  Post procedure conclusions  Ascending Aorta:   - The aorta was mildly calcified.   -------------------------------------------------------------------  Prepared and Electronically Authenticated by   Fransico Him, MD  2019-08-20T17:01:49   Impression:  I have personally reviewed his CTA of the chest images on all of the scans dating back to 10/20/2017.  On his current scan dated 05/26/2020 I measured a maximum diameter at about 4.5 cm in the mid ascending aorta.  This is unchanged from all of his prior scans of measured at the same level.  The 4.7 cm measurement on his most recent scan was done at the junction of the ascending aorta and aortic arch as his aorta is beginning to turn and gives a falsely higher measurement because it is not perpendicular to the axis of the aorta.  I do not think there is been any significant change in his aorta dating back to 2019 and the measurement of 4.5 cm is well below the surgical threshold of 5.5 cm.  His last echocardiogram in 2019 showed mild calcification of the aortic valve with no significant stenosis and only mild regurgitation.  I reviewed the CTA images from his current scan L*and with him and his daughter and answered their questions.  I explained the differences in the measurements based on technique and how it is measured.  They seemed understand.  I stressed the importance of continued good blood pressure control and preventing further enlargement and acute aortic dissection.  I have  recommended that he have a follow-up scan in 1 year.  Plan:  I will plan to see him back in 1 year with a CTA of the chest to follow-up on his fusiform ascending aortic aneurysm.  I spent 60 minutes performing this consultation and > 50% of this time was spent face to face counseling and coordinating the care of this patient's ascending aortic aneurysm.   Gaye Pollack, MD [facility] [phone]

## 2020-06-20 ENCOUNTER — Encounter (HOSPITAL_COMMUNITY): Payer: Medicare Other

## 2020-06-20 ENCOUNTER — Ambulatory Visit: Payer: Medicare Other | Admitting: Vascular Surgery

## 2020-06-26 ENCOUNTER — Other Ambulatory Visit: Payer: Self-pay

## 2020-06-26 ENCOUNTER — Ambulatory Visit (INDEPENDENT_AMBULATORY_CARE_PROVIDER_SITE_OTHER): Payer: Medicare Other | Admitting: Cardiology

## 2020-06-26 ENCOUNTER — Encounter: Payer: Self-pay | Admitting: Cardiology

## 2020-06-26 VITALS — BP 132/72 | HR 51 | Ht 74.0 in | Wt 153.2 lb

## 2020-06-26 DIAGNOSIS — E785 Hyperlipidemia, unspecified: Secondary | ICD-10-CM

## 2020-06-26 DIAGNOSIS — I77810 Thoracic aortic ectasia: Secondary | ICD-10-CM

## 2020-06-26 DIAGNOSIS — I495 Sick sinus syndrome: Secondary | ICD-10-CM

## 2020-06-26 DIAGNOSIS — I5032 Chronic diastolic (congestive) heart failure: Secondary | ICD-10-CM | POA: Diagnosis not present

## 2020-06-26 DIAGNOSIS — I4819 Other persistent atrial fibrillation: Secondary | ICD-10-CM

## 2020-06-26 DIAGNOSIS — I10 Essential (primary) hypertension: Secondary | ICD-10-CM

## 2020-06-26 DIAGNOSIS — I2510 Atherosclerotic heart disease of native coronary artery without angina pectoris: Secondary | ICD-10-CM | POA: Diagnosis not present

## 2020-06-26 DIAGNOSIS — Z01818 Encounter for other preprocedural examination: Secondary | ICD-10-CM

## 2020-06-26 NOTE — Progress Notes (Signed)
Cardiology Office Note:    Date:  06/30/2020   ID:  [name], DOB 09/28/1940, MRN [id number]  PCP:  Seward Carol, MD  Cardiologist:  Fransico Him, MD    Referring MD: Seward Carol, MD   Chief Complaint  Patient presents with   Coronary Artery Disease   Hypertension   Atrial Fibrillation   Hyperlipidemia   Congestive Heart Failure    History of Present Illness:    [name] is a 79 y.o. 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 Cyndia Bent (4.5cm by CT 25/6389)HTD chronicdiastolic CHF.   He is here today for followup and is doing well.  He denies any chest pain or pressure, SOB, DOE, PND, orthopnea, LE edema, dizziness, palpitations or syncope. He is compliant with his meds and is tolerating meds with no SE.  He has not had any further issues with bleeding varicose veins which is followed by Dr. Scot Dock.  He has had a lot of problems with his right hip and knee.  He has seen ortho and had several second opinions to decide if the hip is his problems or the knee so he can get it fixed.  He is not able to ambulate very well at this time and therefore cannot determine if he would have exertional angina.  He will need preop cardiac clearance prior to orthopedic surgery.  Past Medical History:  Diagnosis Date   Ascending aorta dilatation (HCC)    20mm by chest CT angio 04/2018   CAD (coronary artery disease)    a. 05/2006 Abnl stress test w/ 2-59mm ST dep; b. 05/2006 Cath/PCI: LM nl, LAD 30-40ost, 20-30p, 90d (small), D1 nl, D2 nl, LCX nl, OM1/2 nl, RCA Ca2+, 18m/d (3.5x16 [place] BMS & 3.5x12 Liberty BMS), EF 50%.   Chronic diastolic CHF (congestive heart failure) ([place])    a. 10/2017 Echo: EF 50-55%, mild AI, sev dil LA, mod dil RA.   Chronic venous insufficiency    HTN  (hypertension)    PAF (paroxysmal atrial fibrillation) (HCC)    a. CHA2DS2VASc 5-->Eliquis; b. Prev WCT on flecainide;  c. 12/2017 s/p DCCV.   Pneumothorax    Ventricular tachycardia (HCC)    a. possibly proarrhythmia from flecainide.    Past Surgical History:  Procedure Laterality Date   CARDIOVERSION N/A 12/23/2017   Procedure: CARDIOVERSION;  Surgeon: Sueanne Margarita, MD;  Location: [facility] ENDOSCOPY;  Service: Cardiovascular;  Laterality: N/A;   CARDIOVERSION N/A 03/08/2018   Procedure: CARDIOVERSION;  Surgeon: Sueanne Margarita, MD;  Location: [facility] ENDOSCOPY;  Service: Cardiovascular;  Laterality: N/A;   INGUINAL HERNIA REPAIR     x2   TEE WITHOUT CARDIOVERSION N/A 03/08/2018   Procedure: TRANSESOPHAGEAL ECHOCARDIOGRAM (TEE);  Surgeon: Sueanne Margarita, MD;  Location: [facility] ENDOSCOPY;  Service: Cardiovascular;  Laterality: N/A;   TOTAL KNEE ARTHROPLASTY Left 02/28/2018   Procedure: LEFT TOTAL KNEE ARTHROPLASTY;  Surgeon: Gaynelle Arabian, MD;  Location: [facility] ORS;  Service: Orthopedics;  Laterality: Left;   VEIN LIGATION AND STRIPPING Left 04/08/2020   Procedure: LEFT LOWER EXTREMITY VEIN LIGATION AND EXCISION OF ULCER;  Surgeon: Angelia Mould, MD;  Location: [facility] OR;  Service: Vascular;  Laterality: Left;    Current Medications: Current Meds  Medication Sig   amiodarone (PACERONE) 200 MG tablet TAKE 1 TABLET BY MOUTH EVERY DAY   atorvastatin (  LIPITOR) 20 MG tablet TAKE 1 TABLET BY MOUTH EVERY DAY   Coenzyme Q10 (COQ10 PO) Take 1 capsule by mouth every evening.   ELIQUIS 5 MG TABS tablet TAKE 1 TABLET BY MOUTH TWICE A DAY   furosemide (LASIX) 20 MG tablet Take 1 tablet (20 mg total) by mouth every evening.   Glucosamine-Chondroitin (COSAMIN DS PO) Take 1 tablet by mouth daily.   hydrocortisone 2.5 % cream Apply 1 application topically as needed.   Magnesium 400 MG CAPS Take 400 mg by mouth every Monday, Tuesday, Wednesday, Thursday, and Friday.    Multiple Vitamin  (MULTIVITAMIN WITH MINERALS) TABS tablet Take 1 tablet by mouth in the morning and at bedtime.   Omega-3 Fatty Acids (FISH OIL PO) Take 1 capsule by mouth daily.   pindolol (VISKEN) 10 MG tablet Take 0.5 tablets (5 mg total) by mouth 2 (two) times daily.   Zinc 50 MG CAPS Take 100 mg by mouth 3 (three) times a week.      Allergies:   Patient has no known allergies.   Social History   Socioeconomic History   Marital status: Widowed    Spouse name: Not on file   Number of children: Not on file   Years of education: Not on file   Highest education level: Not on file  Occupational History   Occupation: retired  Tobacco Use   Smoking status: Never Smoker   Smokeless tobacco: Never Used  Scientific laboratory technician Use: Never used  Substance and Sexual Activity   Alcohol use: No    Comment: occsaoinal   Drug use: Yes    Types: Other-see comments   Sexual activity: Not Currently  Other Topics Concern   Not on file  Social History Narrative   Lives in [name] by himself but 2 dtrs nearby and help out when necessary.   Social Determinants of Health   Financial Resource Strain: Not on file  Food Insecurity: Not on file  Transportation Needs: Not on file  Physical Activity: Not on file  Stress: Not on file  Social Connections: Not on file     Family History: The patient's family history includes Coronary artery disease in an other family member.  ROS:   Please see the history of present illness.    ROS  All other systems reviewed and negative.   EKGs/Labs/Other Studies Reviewed:    The following studies were reviewed today: EKG and outpt labs from PCP on [facility]  EKG:  EKG is  ordered today.  The ekg ordered today demonstrates sinus bradycardia at 46bpm with no ST changes  Recent Labs: 10/25/2019: ALT 14; TSH 1.200 04/08/2020: BUN 15; Creatinine, Ser 1.30; Platelets 263; Potassium 4.2; Sodium 139 06/26/2020: Hemoglobin 10.0   Recent Lipid Panel    Component Value  Date/Time   CHOL 126 10/25/2019 0931   TRIG 40 10/25/2019 0931   HDL 56 10/25/2019 0931   CHOLHDL 2.3 10/25/2019 0931   CHOLHDL 1.9 06/19/2016 0809   VLDL 7 06/19/2016 0809   LDLCALC 60 10/25/2019 0931    Physical Exam:    VS:  BP 132/72    Pulse (!) 51    Ht 6\' 2"  (1.88 m)    Wt 153 lb 3.2 oz (69.5 kg)    SpO2 99%    BMI 19.67 kg/m     Wt Readings from Last 3 Encounters:  06/26/20 153 lb 3.2 oz (69.5 kg)  06/19/20 155 lb (70.3 kg)  04/18/20 150 lb (68  kg)     GEN: Well nourished, well developed in no acute distress HEENT: Normal NECK: No JVD; No carotid bruits LYMPHATICS: No lymphadenopathy CARDIAC:RRR, no murmurs, rubs, gallops RESPIRATORY:  Clear to auscultation without rales, wheezing or rhonchi  ABDOMEN: Soft, non-tender, non-distended MUSCULOSKELETAL:  No edema; No deformity  SKIN: Warm and dry NEUROLOGIC:  Alert and oriented x 3 PSYCHIATRIC:  Normal affect   ASSESSMENT:    1. Coronary artery disease involving native coronary artery of native heart without angina pectoris   2. HYPERTENSION, BENIGN   3. Chronic diastolic CHF (congestive heart failure) (HCC)   4. Persistent atrial fibrillation ([place])   5. Ascending aorta dilatation (HCC)   6. Dyslipidemia   7. Tachycardia-bradycardia syndrome ([place])   8. Pre-operative clearance    PLAN:    In order of problems listed above:  1.  ASCAD -s/p BMS to RCA -he has not had any anginal symptoms since I saw him last but has very limited mobility currently due to his orthopedic issues -he will require preop cardiac clearance for his surgery and has not had any ischemic workup in some time.He tells me that he would get short winded when mowing the yard this past Summer and unsure whether that was related to his chronic anemia.   I have recommended that we get a Lexiscan myoview to rule out ischemia prior to surgery -continue BB and statin -not on ASA due to [place]  2.  HTN -Bp well controlled on exam today -continue  Pindolol 5mg  BID  3.  Chronic diastolic CHF -he appears euvolemic on exam -continue lasix 20mg  daily -creatinine remains stable at 1.3 -check 2D echo to make sure LVF remains intact  4.  Persistent atrial fibrillation -remains in sinus today and has not had any palpitations -continue Amio 200mg  daily and Pindolol to 5mg  BID  -he has chronic bradycardia but had PAF with RVR on event monitor so will keep on current dose of Amio and BB since he is completely asymptomatic from his bradycardia and from his afib -no bleeding issues on DOAC -continue apixaban 5mg  BID -he gets yearly eye exams -TSH was 1.2 and ALT 14 in April 2021 -SCr 1.3 and Hbg 9.9 in Sept 2021 -PFTs stable -repeat CBC today to see what Hbg is  5.  Ascending aortic aneurysm -chest CTA showed stable 4.4cm aneurysm in the past and repeat last month showed an increase in size and he was referred to CVTS and Dr. Cyndia Bent felt that measurement by radiologist was not correct and was actually 4.5cm -he will followup with Dr. Cyndia Bent in 1 year -continue statin  -BP well controlled -reminded not to do any upper body weight lifting  6.  HLD -LDL goal < 70 -LDL 60 in April 2021 -continue atorvastatin 20mg  daily  7.  Tachy/Brady syndrome -he wore a 24 hour Holter showing episodes of PAF up to 164bpm with average HR in sinus at 54bpm.   -he was referred to afib clinic but declined -he is completely asymptomatic with no palpitations and no dizziness (unless pushing his mower up a steep hill) and was able to workout on the bike at the gym for over 30 minutes without any problems this summer but now with limited mobility due to knee and hip problems. HR 51bpm today . Will not change meds at this time and no PPM indicated   Medication Adjustments/Labs and Tests Ordered: Current medicines are reviewed at length with the patient today.  Concerns regarding medicines are outlined  above.  Orders Placed This Encounter  Procedures    Hemoglobin   Cardiac Stress Test: Informed Consent Details: Physician/Practitioner Attestation; Transcribe to consent form and obtain patient signature   MYOCARDIAL PERFUSION IMAGING   ECHOCARDIOGRAM COMPLETE   No orders of the defined types were placed in this encounter.   Signed, Fransico Him, MD  06/30/2020 2:57 PM    [facility]

## 2020-06-26 NOTE — Patient Instructions (Signed)
Medication Instructions:  Your physician recommends that you continue on your current medications as directed. Please refer to the Current Medication list given to you today.  *If you need a refill on your cardiac medications before your next appointment, please call your pharmacy*   Lab Work: TODAY: hemoglobin If you have labs (blood work) drawn today and your tests are completely normal, you will receive your results only by: Marland Kitchen MyChart Message (if you have MyChart) OR . A paper copy in the mail If you have any lab test that is abnormal or we need to change your treatment, we will call you to review the results.   Testing/Procedures: Your physician has requested that you have a lexiscan myoview. For further information please visit [email or web address]. Please follow instruction sheet, as given.  Your physician has requested that you have an echocardiogram. Echocardiography is a painless test that uses sound waves to create images of your heart. It provides your doctor with information about the size and shape of your heart and how well your heart's chambers and valves are working. This procedure takes approximately one hour. There are no restrictions for this procedure.  Follow-Up: At [facility], you and your health needs are our priority.  As part of our continuing mission to provide you with exceptional heart care, we have created designated Provider Care Teams.  These Care Teams include your primary Cardiologist (physician) and Advanced Practice Providers (APPs -  Physician Assistants and Nurse Practitioners) who all work together to provide you with the care you need, when you need it.  Your next appointment:   6 month(s)  The format for your next appointment:   In Person  Provider:   You may see Fransico Him, MD or one of the following Advanced Practice Providers on your designated Care Team:    Melina Copa, PA-C  Ermalinda Barrios, PA-C

## 2020-06-27 ENCOUNTER — Telehealth: Payer: Self-pay | Admitting: Cardiology

## 2020-06-27 LAB — HEMOGLOBIN: Hemoglobin: 10 g/dL — ABNORMAL LOW (ref 13.0–17.7)

## 2020-06-27 NOTE — Telephone Encounter (Signed)
    Pt is returning Carly's call to get lab result

## 2020-06-27 NOTE — Telephone Encounter (Signed)
Spoke with the patient and reviewed his lab results. I have also answered patient's questions in regards to his lexiscan myoview that is scheduled for 1/13

## 2020-07-01 ENCOUNTER — Telehealth: Payer: Self-pay | Admitting: Cardiology

## 2020-07-01 NOTE — Telephone Encounter (Signed)
Spoke with the patient and answered his questions regarding lexiscan myoview

## 2020-07-01 NOTE — Telephone Encounter (Signed)
Left message for patient to call back  

## 2020-07-01 NOTE — Telephone Encounter (Signed)
Patient states he has questions regarding stress test scheduled for 08/02/19. He is requesting to speak with Dr. Theodosia Blender nurse and he will ask the questions when he speaks with her. Please call.

## 2020-07-08 ENCOUNTER — Telehealth (HOSPITAL_COMMUNITY): Payer: Self-pay | Admitting: Cardiology

## 2020-07-08 ENCOUNTER — Telehealth: Payer: Self-pay | Admitting: *Deleted

## 2020-07-08 NOTE — Telephone Encounter (Signed)
Patient with diagnosis of afib on Eliquis for anticoagulation.    Procedure: right THA Date of procedure: 07/31/20  CHA2DS2-VASc Score = 5  This indicates a 7.2% annual risk of stroke. The patient's score is based upon: CHF History: Yes HTN History: Yes Diabetes History: No Stroke History: No Vascular Disease History: Yes Age Score: 2 Gender Score: 0  CrCl 71mL/min Platelet count 263K  Per office protocol, patient can hold Eliquis for 3 days prior to procedure.    Per Dr Theodosia Blender note on 06/26/20: "[name] will require preop cardiac clearance for his surgery and has not had any ischemic workup in some time.[name] tells me that [name] would get short winded when mowing the yard this past Summer and unsure whether that was related to his chronic anemia.   I have recommended that we get a Lexiscan myoview to rule out ischemia prior to surgery."  [name] was scheduled for 1/13, procedure date is 1/12. Looks as though pt canceled myoview an hour ago but is still planning on going through with procedure. Forwarding back to preop for follow up since the point of his testing was to have it done preop as part of his cardiac clearance.

## 2020-07-08 NOTE — Telephone Encounter (Signed)
Per Coletta Memos, NP 07/08/2020 @ 3:48 PM Patient needs stress test prior to providing cardiac clearance.  Left message to call back and discuss what is needed before he can be cleared for his upcoming surgery in Jan 2022.

## 2020-07-08 NOTE — Telephone Encounter (Signed)
° °   [facility] Pre-operative Risk Assessment    HEARTCARE STAFF: - Please ensure there is not already an duplicate clearance open for this procedure. - Under Visit Info/Reason for Call, type in Other and utilize the format Clearance MM/DD/YY or Clearance TBD. Do not use dashes or single digits. - If request is for dental extraction, please clarify the # of teeth to be extracted.  Request for surgical clearance:  1. What type of surgery is being performed? RIGHT TOTAL HIP ARTHROPLASTY   2. When is this surgery scheduled? 07/31/20   3. What type of clearance is required (medical clearance vs. Pharmacy clearance to hold med vs. Both)? BOTH  4. Are there any medications that need to be held prior to surgery and how long? ELIQUIS   5. Practice name and name of physician performing surgery? [facility]; DR. FRANK ALUISIO   6. What is the office phone number? [phone]   7.   What is the office fax number? [phone]  8.   Anesthesia type (None, local, MAC, general) ? CHOICE   Julaine Hua 07/08/2020, 3:19 PM  _________________________________________________________________   (provider comments below)

## 2020-07-08 NOTE — Telephone Encounter (Signed)
Primary Cardiologist:Traci Turner, MD  Chart reviewed as part of pre-operative protocol coverage. Because of Tyge Somers Schabel's past medical history and current symptoms.  Patient will need further testing.  Pre-op covering staff: - Please contact requesting surgeon's office via preferred method (i.e, phone, fax) to inform them of need for appointment prior to surgery.  Patient will need stress test and echocardiogram per Dr. Radford Pax prior to providing cardiac clearance.  If applicable, this message will also be routed to pharmacy pool and/or primary cardiologist for input on holding anticoagulant/antiplatelet agent as requested below so that this information is available at time of patient's appointment.   Deberah Pelton, NP  07/08/2020, 3:44 PM

## 2020-07-08 NOTE — Telephone Encounter (Signed)
Called pt and notified that he needs stress test before we can clear him for his surgery on 07-31-20. Verbalized understanding. He will await call back. Message sent to scheduling...see duplicate staff message in this box.

## 2020-07-08 NOTE — Telephone Encounter (Signed)
Patient call and cancelled echocardiogram and Myocardial Test due to Patient (has a procedure scheudled for 08/01/19 / states he will call back to let us know whether or not clearance is needed) Per Avelina Laine.  Order will be removed from the WQ's and when patient calls back to reschedule we will reinstate the order.

## 2020-07-09 ENCOUNTER — Telehealth (HOSPITAL_COMMUNITY): Payer: Self-pay

## 2020-07-09 NOTE — Telephone Encounter (Signed)
Attempted to contact the patient, there was no answer. Will try again later. [name] EMTP 

## 2020-07-09 NOTE — Telephone Encounter (Signed)
-----   Message from Waylan Rocher, LPN sent at [id number]/45/9977  5:14 PM EST ----- Regarding: stress test Please call pt ASAP to schedule stress test. Pt is having surgery 07-31-2020 and needs this to be cleared.  Thank you

## 2020-07-10 ENCOUNTER — Telehealth: Payer: Self-pay | Admitting: Cardiology

## 2020-07-10 NOTE — Telephone Encounter (Signed)
Patients daughter called to reschedule his MYOCARDIAL PERFUSION appointment and wanted to know if he needed to take his medicine on the day of his new appointment on 07/22/2020. Please advise.

## 2020-07-10 NOTE — Telephone Encounter (Signed)
Left message for patient's daughter (DPR) to call back 

## 2020-07-10 NOTE — Telephone Encounter (Signed)
LM2CB 

## 2020-07-10 NOTE — Telephone Encounter (Signed)
Called and notified Dr Alusio's office of dates that times for tests

## 2020-07-10 NOTE — Telephone Encounter (Signed)
Will send this to pre op call back as this is needing to be done for pre op

## 2020-07-10 NOTE — Telephone Encounter (Signed)
Patient's daughter called to reschedule the patient's echo for sooner. I did not se anything before his surgery 07/31/2020, but she states he has to have his echo before his surgery and his surgery cannot be rescheduled. She would like to know if he can have the echo at the hospital sooner.

## 2020-07-10 NOTE — Telephone Encounter (Signed)
ECHO scheduled at [facility] 08-02-20 @3pm 

## 2020-07-10 NOTE — Telephone Encounter (Signed)
Received a message that pt has questions about the tests(echo&stress test), wants to know why he needs them. Per discussion pt needs tests because this is an extensive surgery and we need to know is there are any blockages and the health of his heart. H&H is at 10 and need to be very strict about clearance.  Tried to call pt, LM2CB x3

## 2020-07-10 NOTE — Telephone Encounter (Signed)
Returned call to pt and discussed the reason for these tests. Pt is not happy because he will need to reschedule his surgery. Verified the test dates and times of these scheduled dates so pt will not forget to come.  While explaining to pt hung up before I was finished. Will notify requesting party of this scheduling of these tests.

## 2020-07-11 ENCOUNTER — Encounter (HOSPITAL_COMMUNITY): Payer: Medicare Other

## 2020-07-11 ENCOUNTER — Telehealth: Payer: Self-pay | Admitting: Cardiology

## 2020-07-11 NOTE — Telephone Encounter (Signed)
Spoke with patient regarding appointment for Echo scheduled Wednesday 07/17/20 at 3:15 pm at [facility]---arrival time is 3:00 pm--1st floor admissions office.  Patient voiced his understanding.

## 2020-07-11 NOTE — Telephone Encounter (Signed)
[name] @ [facility] called back and states that pt would not reschedule his surgery he is waiting for [place] to call with a sooner appointment.

## 2020-07-11 NOTE — Telephone Encounter (Signed)
Spoke with [name] regarding appointment for Echocardiogram ---scheduled Tuesday 07/16/20 at 1:00pm at [place], [place] time is 12:45 pm for check in.   [name] voiced her understanding and thanked [place] for our help.

## 2020-07-11 NOTE — Telephone Encounter (Signed)
Called and s/w Clide Deutscher daughter-no DPR explained that there are no appointments here in [place] or our other sites and there is only appointments at [facility] if they are willing to travel. She states that they would like an appt at [facility].

## 2020-07-11 NOTE — Telephone Encounter (Signed)
Returned call to pt he states that he does not want to go to [facility] to have the test done and soul like to reschedule in [place]. He is very adamant about cancelling and said that he has waited 6 months "as it is so another few weeks/month will not matter. I will have a scheduler call pt and reschedule this test. I will forward to requesting providers office.

## 2020-07-11 NOTE — Telephone Encounter (Signed)
[name] is calling wanting to reschedule his Echo in [name] so he doesn't have to travel to [name] for it. I was unable to find anything at the [facility] office before his scheduled procedure, so he is requesting it be reschedule for [name] hospital. Please advise.

## 2020-07-11 NOTE — Telephone Encounter (Signed)
Will forward to pre op call back and [employer]. Echo is being scheduled for pt's pre op assessment.

## 2020-07-16 ENCOUNTER — Other Ambulatory Visit (HOSPITAL_COMMUNITY): Payer: Medicare Other

## 2020-07-17 ENCOUNTER — Ambulatory Visit (HOSPITAL_COMMUNITY)
Admission: RE | Admit: 2020-07-17 | Discharge: 2020-07-17 | Disposition: A | Discharge status: Home / Self Care | Payer: Medicare Other | Source: Ambulatory Visit | Attending: Cardiology | Admitting: Cardiology

## 2020-07-17 ENCOUNTER — Telehealth (HOSPITAL_COMMUNITY): Payer: Self-pay | Admitting: Radiology

## 2020-07-17 ENCOUNTER — Other Ambulatory Visit: Payer: Self-pay

## 2020-07-17 ENCOUNTER — Telehealth (HOSPITAL_COMMUNITY): Payer: Self-pay | Admitting: *Deleted

## 2020-07-17 DIAGNOSIS — I4891 Unspecified atrial fibrillation: Secondary | ICD-10-CM | POA: Diagnosis not present

## 2020-07-17 DIAGNOSIS — I2510 Atherosclerotic heart disease of native coronary artery without angina pectoris: Secondary | ICD-10-CM | POA: Insufficient documentation

## 2020-07-17 DIAGNOSIS — E785 Hyperlipidemia, unspecified: Secondary | ICD-10-CM | POA: Insufficient documentation

## 2020-07-17 DIAGNOSIS — I351 Nonrheumatic aortic (valve) insufficiency: Secondary | ICD-10-CM | POA: Insufficient documentation

## 2020-07-17 DIAGNOSIS — I77810 Thoracic aortic ectasia: Secondary | ICD-10-CM | POA: Insufficient documentation

## 2020-07-17 DIAGNOSIS — I5032 Chronic diastolic (congestive) heart failure: Secondary | ICD-10-CM | POA: Insufficient documentation

## 2020-07-17 NOTE — Progress Notes (Signed)
  Echocardiogram 2D Echocardiogram has been performed.  [name] 07/17/2020, 3:58 PM

## 2020-07-17 NOTE — Telephone Encounter (Signed)
Patient given detailed instructions per Myocardial Perfusion Study Information Sheet for the test on 07/23/2019 at 10:30. Patient notified to arrive 15 minutes early and that it is imperative to arrive on time for appointment to keep from having the test rescheduled.  If you need to cancel or reschedule your appointment, please call the office within 24 hours of your appointment. . Patient verbalized understanding.EHK

## 2020-07-17 NOTE — Telephone Encounter (Signed)
Left message on voicemail per DPR in reference to upcoming appointment scheduled on 07/23/19 at 10:30 with detailed instructions given per Myocardial Perfusion Study Information Sheet for the test. LM to arrive 15 minutes early, and that it is imperative to arrive on time for appointment to keep from having the test rescheduled. If you need to cancel or reschedule your appointment, please call the office within 24 hours of your appointment. Failure to do so may result in a cancellation of your appointment, and a $50 no show fee. Phone number given for call back for any questions.

## 2020-07-22 ENCOUNTER — Other Ambulatory Visit: Payer: Self-pay

## 2020-07-22 ENCOUNTER — Ambulatory Visit (HOSPITAL_COMMUNITY): Payer: Medicare Other | Attending: Cardiology

## 2020-07-22 DIAGNOSIS — Z01818 Encounter for other preprocedural examination: Secondary | ICD-10-CM | POA: Diagnosis present

## 2020-07-22 DIAGNOSIS — I2510 Atherosclerotic heart disease of native coronary artery without angina pectoris: Secondary | ICD-10-CM | POA: Insufficient documentation

## 2020-07-22 LAB — MYOCARDIAL PERFUSION IMAGING
LV dias vol: 132 mL (ref 62–150)
LV sys vol: 51 mL
Peak HR: 60 {beats}/min
Rest HR: 46 {beats}/min
SDS: 1
SRS: 0
SSS: 1
TID: 1.08

## 2020-07-22 MED ORDER — TECHNETIUM TC 99M TETROFOSMIN IV KIT
10.2000 | PACK | Freq: Once | INTRAVENOUS | Status: AC | PRN
  Administered 2020-07-22: 10.2 via INTRAVENOUS
  Filled 2020-07-22: qty 11

## 2020-07-22 MED ORDER — TECHNETIUM TC 99M TETROFOSMIN IV KIT
32.7000 | PACK | Freq: Once | INTRAVENOUS | Status: AC | PRN
  Administered 2020-07-22: 32.7 via INTRAVENOUS
  Filled 2020-07-22: qty 33

## 2020-07-22 MED ORDER — REGADENOSON 0.4 MG/5ML IV SOLN
0.4000 mg | Freq: Once | INTRAVENOUS | Status: AC
  Administered 2020-07-22: 0.4 mg via INTRAVENOUS

## 2020-07-25 LAB — ECHOCARDIOGRAM COMPLETE
Area-P 1/2: 2.95 cm2
MV M vel: 4.86 m/s
MV Peak grad: 94.5 mmHg
P 1/2 time: 622 msec
S' Lateral: 3.1 cm

## 2020-07-25 NOTE — Patient Instructions (Addendum)
DUE TO COVID-19 ONLY ONE VISITOR IS ALLOWED TO COME WITH YOU AND STAY IN THE WAITING ROOM ONLY DURING PRE OP AND PROCEDURE DAY OF SURGERY. THE 1 VISITOR  MAY VISIT WITH YOU AFTER SURGERY IN YOUR PRIVATE ROOM DURING VISITING HOURS ONLY!  ONCE YOUR COVID TEST IS COMPLETED,  PLEASE BEGIN THE QUARANTINE INSTRUCTIONS AS OUTLINED IN YOUR HANDOUT.                [name]    Your procedure is scheduled on: 07/31/20   Report to [facility] Main  Entrance   Report to admitting at 9:15 AM     Call this number if you have problems the morning of surgery [phone]   . BRUSH YOUR TEETH MORNING OF SURGERY AND RINSE YOUR MOUTH OUT, NO CHEWING GUM CANDY OR MINTS.   No food after midnight.    You may have clear liquid until 8:30 AM.    At 8:00 AM drink pre surgery drink   Nothing by mouth after 8:30 AM.  Take these medicines the morning of surgery: Amiodarone, Pindolol                                 You may not have any metal on your body including               piercings  Do not wear jewelry, lotions, powders, colognes or deodorant                         Men may shave face and neck.   Do not bring valuables to the hospital. [name].  Contacts, dentures or bridgework may not be worn into surgery.                  _____________________________________________________________________             [facility] - Preparing for Surgery Before surgery, you can play an important role.   Because skin is not sterile, your skin needs to be as free of germs as possible   You can reduce the number of germs on your skin by washing with CHG (chlorahexidine gluconate) soap before surgery.   CHG is an antiseptic cleaner which kills germs and bonds with the skin to continue killing germs even after washing. Please DO NOT use if you have an allergy to CHG or antibacterial soaps .  If your skin becomes reddened/irritated stop using  the CHG and inform your nurse when you arrive at Short Stay.   You may shave your face/neck. Please follow these instructions carefully:   1.  Shower with CHG Soap the night before surgery and the  morning of Surgery.  2.  If you choose to wash your hair, wash your hair first as usual with your  normal  shampoo.  3.  After you shampoo, rinse your hair and body thoroughly to remove the  shampoo.                                        4.  Use CHG as you would any other liquid soap.  You can apply chg directly  to the skin and wash  Gently with a scrungie or clean washcloth.  5.  Apply the CHG Soap to your body ONLY FROM THE NECK DOWN.   Do not use on face/ open                           Wound or open sores. Avoid contact with eyes, ears mouth and genitals (private parts).                       Wash face,  Genitals (private parts) with your normal soap.             6.  Wash thoroughly, paying special attention to the area where your surgery  will be performed.  7.  Thoroughly rinse your body with warm water from the neck down.  8.  DO NOT shower/wash with your normal soap after using and rinsing off  the CHG Soap.             9.  Pat yourself dry with a clean towel.            10.  Wear clean pajamas.            11.  Place clean sheets on your bed the night of your first shower and do not  sleep with pets. Day of Surgery : Do not apply any lotions/deodorants the morning of surgery.  Please wear clean clothes to the hospital/surgery center.  FAILURE TO FOLLOW THESE INSTRUCTIONS MAY RESULT IN THE CANCELLATION OF YOUR SURGERY PATIENT SIGNATURE_________________________________  NURSE SIGNATURE__________________________________  ________________________________________________________________________   [name]  An incentive spirometer is a tool that can help keep your lungs clear and active. This tool measures how well you are filling your lungs with each  breath. Taking long deep breaths may help reverse or decrease the chance of developing breathing (pulmonary) problems (especially infection) following:  A long period of time when you are unable to move or be active. BEFORE THE PROCEDURE   If the spirometer includes an indicator to show your best effort, your nurse or respiratory therapist will set it to a desired goal.  If possible, sit up straight or lean slightly forward. Try not to slouch.  Hold the incentive spirometer in an upright position. INSTRUCTIONS FOR USE  1. Sit on the edge of your bed if possible, or sit up as far as you can in bed or on a chair. 2. Hold the incentive spirometer in an upright position. 3. Breathe out normally. 4. Place the mouthpiece in your mouth and seal your lips tightly around it. 5. Breathe in slowly and as deeply as possible, raising the piston or the ball toward the top of the column. 6. Hold your breath for 3-5 seconds or for as long as possible. Allow the piston or ball to fall to the bottom of the column. 7. Remove the mouthpiece from your mouth and breathe out normally. 8. Rest for a few seconds and repeat Steps 1 through 7 at least 10 times every 1-2 hours when you are awake. Take your time and take a few normal breaths between deep breaths. 9. The spirometer may include an indicator to show your best effort. Use the indicator as a goal to work toward during each repetition. 10. After each set of 10 deep breaths, practice coughing to be sure your lungs are clear. If you have an incision (the cut made at the time of surgery), support your incision  when coughing by placing a pillow or rolled up towels firmly against it. Once you are able to get out of bed, walk around indoors and cough well. You may stop using the incentive spirometer when instructed by your caregiver.  RISKS AND COMPLICATIONS  Take your time so you do not get dizzy or light-headed.  If you are in pain, you may need to take or ask  for pain medication before doing incentive spirometry. It is harder to take a deep breath if you are having pain. AFTER USE  Rest and breathe slowly and easily.  It can be helpful to keep track of a log of your progress. Your caregiver can provide you with a simple table to help with this. If you are using the spirometer at home, follow these instructions: [place] IF:   You are having difficultly using the spirometer.  You have trouble using the spirometer as often as instructed.  Your pain medication is not giving enough relief while using the spirometer.  You develop fever of 100.5 F (38.1 C) or higher. SEEK IMMEDIATE MEDICAL CARE IF:   You cough up bloody sputum that had not been present before.  You develop fever of 102 F (38.9 C) or greater.  You develop worsening pain at or near the incision site. MAKE SURE YOU:   Understand these instructions.  Will watch your condition.  Will get help right away if you are not doing well or get worse. Document Released: 11/16/2006 Document Revised: 09/28/2011 Document Reviewed: 01/17/2007 ExitCare Patient Information 2014 ExitCare, [place].   ________________________________________________________________________  WHAT IS A BLOOD TRANSFUSION? Blood Transfusion Information  A transfusion is the replacement of blood or some of its parts. Blood is made up of multiple cells which provide different functions.  Red blood cells carry oxygen and are used for blood loss replacement.  White blood cells fight against infection.  Platelets control bleeding.  Plasma helps clot blood.  Other blood products are available for specialized needs, such as hemophilia or other clotting disorders. BEFORE THE TRANSFUSION  Who gives blood for transfusions?   Healthy volunteers who are fully evaluated to make sure their blood is safe. This is blood bank blood. Transfusion therapy is the safest it has ever been in the practice of  medicine. Before blood is taken from a donor, a complete history is taken to make sure that person has no history of diseases nor engages in risky social behavior (examples are intravenous drug use or sexual activity with multiple partners). The donor's travel history is screened to minimize risk of transmitting infections, such as malaria. The donated blood is tested for signs of infectious diseases, such as HIV and hepatitis. The blood is then tested to be sure it is compatible with you in order to minimize the chance of a transfusion reaction. If you or a relative donates blood, this is often done in anticipation of surgery and is not appropriate for emergency situations. It takes many days to process the donated blood. RISKS AND COMPLICATIONS Although transfusion therapy is very safe and saves many lives, the main dangers of transfusion include:   Getting an infectious disease.  Developing a transfusion reaction. This is an allergic reaction to something in the blood you were given. Every precaution is taken to prevent this. The decision to have a blood transfusion has been considered carefully by your caregiver before blood is given. Blood is not given unless the benefits outweigh the risks. AFTER THE TRANSFUSION  Right after  receiving a blood transfusion, you will usually feel much better and more energetic. This is especially true if your red blood cells have gotten low (anemic). The transfusion raises the level of the red blood cells which carry oxygen, and this usually causes an energy increase.  The nurse administering the transfusion will monitor you carefully for complications. HOME CARE INSTRUCTIONS  No special instructions are needed after a transfusion. You may find your energy is better. Speak with your caregiver about any limitations on activity for underlying diseases you may have. SEEK MEDICAL CARE IF:   Your condition is not improving after your transfusion.  You develop  redness or irritation at the intravenous (IV) site. SEEK IMMEDIATE MEDICAL CARE IF:  Any of the following symptoms occur over the next 12 hours:  Shaking chills.  You have a temperature by mouth above 102 F (38.9 C), not controlled by medicine.  Chest, back, or muscle pain.  People around you feel you are not acting correctly or are confused.  Shortness of breath or difficulty breathing.  Dizziness and fainting.  You get a rash or develop hives.  You have a decrease in urine output.  Your urine turns a dark color or changes to pink, red, or brown. Any of the following symptoms occur over the next 10 days:  You have a temperature by mouth above 102 F (38.9 C), not controlled by medicine.  Shortness of breath.  Weakness after normal activity.  The white part of the eye turns yellow (jaundice).  You have a decrease in the amount of urine or are urinating less often.  Your urine turns a dark color or changes to pink, red, or brown. Document Released: 07/03/2000 Document Revised: 09/28/2011 Document Reviewed: 02/20/2008 [facility] Patient Information 2014 [place], [place].  _______________________________________________________________________

## 2020-07-26 ENCOUNTER — Telehealth: Payer: Self-pay | Admitting: Cardiology

## 2020-07-26 NOTE — Telephone Encounter (Signed)
   Kristie with Dr. Peri Maris office calling, she would like to follow up clearance how long pt can hold eliquis prior surgery and also, she wanted to make sure Dr. Radford Pax aware that pt's daughter wants pt to be on teli heart monitor while in the hospital and would like to get her recommendations

## 2020-07-26 NOTE — Telephone Encounter (Signed)
Spoke with [name] and confirmed that the patient could hold Eliquis for 3 days prior to surgery. She also states that the patient and his family requested that he be on telemetry after surgery so they are going to put him on telemetry. They also requested that the patient have a cardiology consult post-op, however their protocol is to only have cardiology consult if the patient has cardiac problems after surgery.

## 2020-07-29 ENCOUNTER — Encounter (HOSPITAL_COMMUNITY): Payer: Self-pay

## 2020-07-29 ENCOUNTER — Other Ambulatory Visit (HOSPITAL_COMMUNITY)
Admission: RE | Admit: 2020-07-29 | Discharge: 2020-07-29 | Disposition: A | Discharge status: Home / Self Care | Payer: Medicare Other | Source: Ambulatory Visit

## 2020-07-29 ENCOUNTER — Encounter (HOSPITAL_COMMUNITY)
Admission: RE | Admit: 2020-07-29 | Discharge: 2020-07-29 | Disposition: A | Discharge status: Home / Self Care | Payer: Medicare Other | Source: Ambulatory Visit | Attending: Orthopedic Surgery | Admitting: Orthopedic Surgery

## 2020-07-29 ENCOUNTER — Other Ambulatory Visit: Payer: Self-pay

## 2020-07-29 ENCOUNTER — Encounter (HOSPITAL_COMMUNITY): Payer: Self-pay | Admitting: Physician Assistant

## 2020-07-29 DIAGNOSIS — Z01812 Encounter for preprocedural laboratory examination: Secondary | ICD-10-CM | POA: Insufficient documentation

## 2020-07-29 DIAGNOSIS — Z20822 Contact with and (suspected) exposure to covid-19: Secondary | ICD-10-CM | POA: Insufficient documentation

## 2020-07-29 LAB — CBC
HCT: 32.1 % — ABNORMAL LOW (ref 39.0–52.0)
Hemoglobin: 9.5 g/dL — ABNORMAL LOW (ref 13.0–17.0)
MCH: 23.1 pg — ABNORMAL LOW (ref 26.0–34.0)
MCHC: 29.6 g/dL — ABNORMAL LOW (ref 30.0–36.0)
MCV: 78.1 fL — ABNORMAL LOW (ref 80.0–100.0)
Platelets: 284 10*3/uL (ref 150–400)
RBC: 4.11 MIL/uL — ABNORMAL LOW (ref 4.22–5.81)
RDW: 18.8 % — ABNORMAL HIGH (ref 11.5–15.5)
WBC: 7.9 10*3/uL (ref 4.0–10.5)
nRBC: 0 % (ref 0.0–0.2)

## 2020-07-29 LAB — COMPREHENSIVE METABOLIC PANEL
ALT: 18 U/L (ref 0–44)
AST: 20 U/L (ref 15–41)
Albumin: 3.6 g/dL (ref 3.5–5.0)
Alkaline Phosphatase: 78 U/L (ref 38–126)
Anion gap: 11 (ref 5–15)
BUN: 15 mg/dL (ref 8–23)
CO2: 25 mmol/L (ref 22–32)
Calcium: 8.9 mg/dL (ref 8.9–10.3)
Chloride: 103 mmol/L (ref 98–111)
Creatinine, Ser: 1.05 mg/dL (ref 0.61–1.24)
GFR, Estimated: >60 mL/min (ref >60)
Glucose, Bld: 95 mg/dL (ref 70–99)
Potassium: 4.1 mmol/L (ref 3.5–5.1)
Sodium: 139 mmol/L (ref 135–145)
Total Bilirubin: 0.7 mg/dL (ref 0.3–1.2)
Total Protein: 6.3 g/dL — ABNORMAL LOW (ref 6.5–8.1)

## 2020-07-29 LAB — APTT: aPTT: 36 seconds (ref 24–36)

## 2020-07-29 LAB — SARS CORONAVIRUS 2 (TAT 6-24 HRS): SARS Coronavirus 2: NEGATIVE

## 2020-07-29 LAB — PROTIME-INR
INR: 1.1 (ref 0.8–1.2)
Prothrombin Time: 13.7 seconds (ref 11.4–15.2)

## 2020-07-29 LAB — SURGICAL PCR SCREEN
MRSA, PCR: NEGATIVE
Staphylococcus aureus: NEGATIVE

## 2020-07-29 NOTE — H&P (View-Only) (Signed)
TOTAL HIP ADMISSION H&P  Patient is admitted for right total hip arthroplasty.  Subjective:  Chief Complaint: Right hip pain  HPI: [name], 80 y.o. male, has a history of pain and functional disability in the right hip due to arthritis and patient has failed non-surgical conservative treatments for greater than 12 weeks to include activity modification. Onset of symptoms was gradual, starting 1 year ago with gradually worsening course since that time. The patient noted no past surgery on the right hip. Patient currently rates pain in the right hip at 8 out of 10 with activity. Patient has worsening of pain with activity and weight bearing, pain that interfers with activities of daily living and crepitus. 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 This almost appears fused. He has some flattening of the femoral head and subchondral cysts by imaging studies. This condition presents safety issues increasing the risk of falls. There is no current active infection.  Patient Active Problem List   Diagnosis Date Noted  . Bilateral lower extremity edema 05/24/2018  . Persistent atrial fibrillation ([place]) 03/16/2018  . Ascending aorta dilatation (HCC)   . OA (osteoarthritis) of knee 02/28/2018  . Chronic diastolic CHF (congestive heart failure) ([place])   . CAD (coronary artery disease) 06/26/2013  . Ventricular tachycardia ([place])   . Chronic venous insufficiency   . Dyslipidemia 06/08/2011  . 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 HYPERTENSION, BENIGN 05/09/2009    Past Medical History:  Diagnosis Date  . Ascending aorta dilatation (HCC)    46mm by chest CT angio 04/2018  . CAD (coronary artery disease)    a. 05/2006 Abnl stress test w/ 2-63mm ST dep; b. 05/2006 Cath/PCI: LM nl, LAD 30-40ost, 20-30p, 90d (small), D1 nl, D2 nl, LCX nl, OM1/2 nl, RCA Ca2+, 43m/d (3.5x16 Liberty BMS & 3.5x12 Liberty BMS), EF 50%.  . Chronic diastolic CHF (congestive heart failure) ([place])    a.  10/2017 Echo: EF 50-55%, mild AI, sev dil LA, mod dil RA.  Marland Kitchen Chronic venous insufficiency   . HTN (hypertension)   . PAF (paroxysmal atrial fibrillation) ([place])    a. CHA2DS2VASc 5-->Eliquis; b. Prev WCT on flecainide;  c. 12/2017 s/p DCCV.  Marland Kitchen Pneumothorax   . Ventricular tachycardia (HCC)    a. possibly proarrhythmia from flecainide.    Past Surgical History:  Procedure Laterality Date  . CARDIOVERSION N/A 12/23/2017   Procedure: CARDIOVERSION;  Surgeon: Sueanne Margarita, MD;  [facility] ENDOSCOPY;  Service: Cardiovascular;  Laterality: N/A;  . CARDIOVERSION N/A 03/08/2018   Procedure: CARDIOVERSION;  Surgeon: Sueanne Margarita, MD;  Location: [facility] ENDOSCOPY;  Service: Cardiovascular;  Laterality: N/A;  . INGUINAL HERNIA REPAIR     x2  . TEE WITHOUT CARDIOVERSION N/A 03/08/2018   Procedure: TRANSESOPHAGEAL ECHOCARDIOGRAM (TEE);  Surgeon: Sueanne Margarita, MD;  Location[facility] ENDOSCOPY;  Service: Cardiovascular;  Laterality: N/A;  . TOTAL KNEE ARTHROPLASTY Left 02/28/2018   Procedure: LEFT TOTAL KNEE ARTHROPLASTY;  Surgeon: Gaynelle Arabian, MD;  Location: [facility] ORS;  Service: Orthopedics;  Laterality: Left;  Marland Kitchen VEIN LIGATION AND STRIPPING Left 04/08/2020   Procedure: LEFT LOWER EXTREMITY VEIN LIGATION AND EXCISION OF ULCER;  Surgeon: Angelia Mould, MD;  Location: [place];  Service: Vascular;  Laterality: Left;    Prior to Admission medications   Medication Sig Start Date End Date Taking? Authorizing Provider  acetaminophen (TYLENOL) 325 MG tablet Take 650 mg by mouth every 6 (six) hours as needed for moderate pain.   Yes  [provider]  amiodarone (PACERONE) 200 MG tablet TAKE 1 TABLET BY MOUTH EVERY DAY Patient taking differently: Take 200 mg by mouth daily. 11/20/19  Yes Turner, Eber Hong, MD  ascorbic acid (VITAMIN C) 500 MG tablet Take 500 mg by mouth daily.   Yes [provider]  atorvastatin (LIPITOR) 20 MG tablet TAKE 1 TABLET BY MOUTH EVERY DAY Patient taking differently:  Take 20 mg by mouth daily. 02/29/20  Yes Turner, Eber Hong, MD  Boswellia Serrata (BOSWELLIA PO) Take 1,200 mg by mouth daily.   Yes [provider]  Coenzyme Q10 (COQ10) 100 MG CAPS Take 100 mg by mouth daily.   Yes [provider]  ELIQUIS 5 MG TABS tablet TAKE 1 TABLET BY MOUTH TWICE A DAY Patient taking differently: Take 5 mg by mouth 2 (two) times daily. 04/18/20  Yes Turner, Eber Hong, MD  furosemide (LASIX) 20 MG tablet Take 1 tablet (20 mg total) by mouth every evening. 04/11/20 04/11/21 Yes Turner, Eber Hong, MD  Magnesium 250 MG TABS Take 250 mg by mouth daily.   Yes [provider]  Misc Natural Products (GLUCOSAMINE CHONDROITIN TRIPLE) TABS Take 1 tablet by mouth daily.   Yes [provider]  Multiple Vitamin (MULTIVITAMIN WITH MINERALS) TABS tablet Take 1 tablet by mouth daily.   Yes [provider]  Omega-3 Fatty Acids (FISH OIL) 1200 MG CAPS Take 1,200 mg by mouth daily.   Yes [provider]  pindolol (VISKEN) 10 MG tablet Take 0.5 tablets (5 mg total) by mouth 2 (two) times daily. 10/24/19  Yes Turner, Eber Hong, MD  Zinc 50 MG CAPS Take 100 mg by mouth 3 (three) times a week.    Yes [provider]    No Known Allergies  Social History   Socioeconomic History  . Marital status: Widowed    Spouse name: Not on file  . Number of children: Not on file  . Years of education: Not on file  . Highest education level: Not on file  Occupational History  . Occupation: retired  Tobacco Use  . Smoking status: Never Smoker  . Smokeless tobacco: Never Used  Vaping Use  . Vaping Use: Never used  Substance and Sexual Activity  . Alcohol use: No    Comment: occsaoinal  . Drug use: Yes    Types: Other-see comments  . Sexual activity: Not Currently  Other Topics Concern  . Not on file  Social History Narrative   Lives in [place] by himself but 2 dtrs nearby and help out when necessary.   Social Determinants of Health   Financial  Resource Strain: Not on file  Food Insecurity: Not on file  Transportation Needs: Not on file  Physical Activity: Not on file  Stress: Not on file  Social Connections: Not on file  Intimate Partner Violence: Not on file    Tobacco Use: Low Risk   . Smoking Tobacco Use: Never Smoker  . Smokeless Tobacco Use: Never Used   Social History   Substance and Sexual Activity  Alcohol Use No   Comment: occsaoinal    Family History  Problem Relation Age of Onset  . Coronary artery disease Other     Review of Systems  Constitutional: Negative for chills and fever.  HENT: Negative for congestion, sore throat and tinnitus.   Eyes: Negative for double vision, photophobia and pain.  Respiratory: Negative for cough, shortness of breath and wheezing.   Cardiovascular: Negative for chest pain, palpitations  and orthopnea.  Gastrointestinal: Negative for heartburn, nausea and vomiting.  Genitourinary: Negative for dysuria, frequency and urgency.  Musculoskeletal: Positive for joint pain.  Neurological: Negative for dizziness, weakness and headaches.   Objective:  Physical Exam: Well nourished and well developed.  General: Alert and oriented x3, cooperative and pleasant, no acute distress.  Head: normocephalic, atraumatic, neck supple.  Eyes: EOMI.  Respiratory: breath sounds clear in all fields, no wheezing, rales, or rhonchi. Cardiovascular: Regular rate and rhythm, no murmurs, gallops or rubs.  Abdomen: non-tender to palpation and soft, normoactive bowel sounds. Musculoskeletal:  Right Hip Exam:  The range of motion: Flexion to 100 degrees, Internal Rotation to 0 degrees, External Rotation to 0 degrees, abduction, and adduction to 0 degrees without discomfort.  There is no tenderness over the greater trochanteric bursa.   Calves soft and nontender. Motor function intact in LE. Strength 5/5 LE bilaterally. Neuro: Distal pulses 2+. Sensation to light touch intact in LE.  Imaging  Review Plain radiographs demonstrate severe degenerative joint disease of the right hip. The bone quality appears to be adequate for age and reported activity level.  Assessment/Plan:  End stage arthritis, right hip  The patient history, physical examination, clinical judgement of the provider and imaging studies are consistent with end stage degenerative joint disease of the right hip and total hip arthroplasty is deemed medically necessary. The treatment options including medical management, injection therapy, arthroscopy and arthroplasty were discussed at length. The risks and benefits of total hip arthroplasty were presented and reviewed. The risks due to aseptic loosening, infection, stiffness, dislocation/subluxation, thromboembolic complications and other imponderables were discussed. The patient acknowledged the explanation, agreed to proceed with the plan and consent was signed. Patient is being admitted for inpatient treatment for surgery, pain control, PT, OT, prophylactic antibiotics, VTE prophylaxis, progressive ambulation and ADLs and discharge planning.The patient is planning to be discharged home.   Patient's anticipated LOS is less than 2 midnights, meeting these requirements: - Lives within 1 hour of care - Has a competent adult at home to recover with post-op recover - NO history of  - Chronic pain requiring opiods  - Diabetes  - Coronary Artery Disease  - Heart failure  - Heart attack  - Stroke  - DVT/VTE  - Respiratory Failure/COPD  - Renal failure  - Anemia  - Advanced Liver disease  Therapy Plans: HEP Disposition: Home with daughter Planned DVT Prophylaxis: Eliquis 5 mg BID DME Needed: None PCP: Seward Carol, MD Cardiologist: Fransico Him, MD TXA: IV Allergies: NKDA Anesthesia Concerns: None BMI: 19 Last HgbA1c: Not diabetic [facility]  Other:  - History of atrial fibrillation onset following TKA - Telemetry bed  - Patient was  instructed on what medications to stop prior to surgery. - Follow-up visit in 2 weeks with Dr. Wynelle Link - Begin physical therapy following surgery - Pre-operative lab work as pre-surgical testing - Prescriptions will be provided in hospital at time of discharge  Theresa Duty, PA-C Orthopedic Surgery [facility] [place]

## 2020-07-29 NOTE — Progress Notes (Signed)
Cardiologist Dr Fransico Him  EKG/epic 10/11/2019 ECHO/epic 07/17/2020 Stress Test/epic 07/22/2020  CT Angio Chest Aorta/epic 05/24/2020  Pulmonary Function Test/epic 01/29/2020   Pt stated last dose of Eliquis taken on Sunday 07/28/2020 Pt stated he was informed per Dr Radford Pax and Dr Aluisio's office to discontinue all medications till after surgery. Pt has still been taking his Amiodarone. Nurse questioned pt on Furosemide and Pindolol. Pt stated he was instructed to hold till after surgery. Nurse and Janett Billow PA spoke with pt and instructed on importance of these medications related to surgery and anesthesia. Pt's instruction sheet highlighted to note pt to take Amiodarone and Pindolol prescriptions am of surgery. Pt verbalized understanding.   Patient verbalized understanding of instructions that were given to them at the PAT appointment. Patient was also instructed that they will need to review over the PAT instructions again at home before surgery.    Spoke with patient's daughter Zane Herald [facility] in regards to medications and clarification. Pts daughter verbalized understanding.

## 2020-07-29 NOTE — H&P (Signed)
TOTAL HIP ADMISSION H&P  Patient is admitted for right total hip arthroplasty.  Subjective:  Chief Complaint: Right hip pain  HPI: [name], 80 y.o. male, has a history of pain and functional disability in the right hip due to arthritis and patient has failed non-surgical conservative treatments for greater than 12 weeks to include activity modification. Onset of symptoms was gradual, starting 1 year ago with gradually worsening course since that time. The patient noted no past surgery on the right hip. Patient currently rates pain in the right hip at 8 out of 10 with activity. Patient has worsening of pain with activity and weight bearing, pain that interfers with activities of daily living and crepitus. 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 This almost appears fused. He has some flattening of the femoral head and subchondral cysts by imaging studies. This condition presents safety issues increasing the risk of falls. There is no current active infection.  Patient Active Problem List   Diagnosis Date Noted  . Bilateral lower extremity edema 05/24/2018  . Persistent atrial fibrillation ([place]) 03/16/2018  . Ascending aorta dilatation (HCC)   . OA (osteoarthritis) of knee 02/28/2018  . Chronic diastolic CHF (congestive heart failure) ([place])   . CAD (coronary artery disease) 06/26/2013  . Ventricular tachycardia ([place])   . Chronic venous insufficiency   . Dyslipidemia 06/08/2011  . 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 HYPERTENSION, BENIGN 05/09/2009    Past Medical History:  Diagnosis Date  . Ascending aorta dilatation (HCC)    46mm by chest CT angio 04/2018  . CAD (coronary artery disease)    a. 05/2006 Abnl stress test w/ 2-63mm ST dep; b. 05/2006 Cath/PCI: LM nl, LAD 30-40ost, 20-30p, 90d (small), D1 nl, D2 nl, LCX nl, OM1/2 nl, RCA Ca2+, 43m/d (3.5x16 Liberty BMS & 3.5x12 Liberty BMS), EF 50%.  . Chronic diastolic CHF (congestive heart failure) ([place])    a.  10/2017 Echo: EF 50-55%, mild AI, sev dil LA, mod dil RA.  Marland Kitchen Chronic venous insufficiency   . HTN (hypertension)   . PAF (paroxysmal atrial fibrillation) ([place])    a. CHA2DS2VASc 5-->Eliquis; b. Prev WCT on flecainide;  c. 12/2017 s/p DCCV.  Marland Kitchen Pneumothorax   . Ventricular tachycardia (HCC)    a. possibly proarrhythmia from flecainide.    Past Surgical History:  Procedure Laterality Date  . CARDIOVERSION N/A 12/23/2017   Procedure: CARDIOVERSION;  Surgeon: Sueanne Margarita, MD;  Location[facility] ENDOSCOPY;  Service: Cardiovascular;  Laterality: N/A;  . CARDIOVERSION N/A 03/08/2018   Procedure: CARDIOVERSION;  Surgeon: Sueanne Margarita, MD;  Location: [facility] ENDOSCOPY;  Service: Cardiovascular;  Laterality: N/A;  . INGUINAL HERNIA REPAIR     x2  . TEE WITHOUT CARDIOVERSION N/A 03/08/2018   Procedure: TRANSESOPHAGEAL ECHOCARDIOGRAM (TEE);  Surgeon: Sueanne Margarita, MD;  Location: [facility] ENDOSCOPY;  Service: Cardiovascular;  Laterality: N/A;  . TOTAL KNEE ARTHROPLASTY Left 02/28/2018   Procedure: LEFT TOTAL KNEE ARTHROPLASTY;  Surgeon: Gaynelle Arabian, MD;  Location: [facility] ORS;  Service: Orthopedics;  Laterality: Left;  Marland Kitchen VEIN LIGATION AND STRIPPING Left 04/08/2020   Procedure: LEFT LOWER EXTREMITY VEIN LIGATION AND EXCISION OF ULCER;  Surgeon: Angelia Mould, MD;  Location: [place];  Service: Vascular;  Laterality: Left;    Prior to Admission medications   Medication Sig Start Date End Date Taking? Authorizing Provider  acetaminophen (TYLENOL) 325 MG tablet Take 650 mg by mouth every 6 (six) hours as needed for moderate pain.   Yes  [provider]  amiodarone (PACERONE) 200 MG tablet TAKE 1 TABLET BY MOUTH EVERY DAY Patient taking differently: Take 200 mg by mouth daily. 11/20/19  Yes Turner, Eber Hong, MD  ascorbic acid (VITAMIN C) 500 MG tablet Take 500 mg by mouth daily.   Yes [provider]  atorvastatin (LIPITOR) 20 MG tablet TAKE 1 TABLET BY MOUTH EVERY DAY Patient taking differently:  Take 20 mg by mouth daily. 02/29/20  Yes Turner, Eber Hong, MD  Boswellia Serrata (BOSWELLIA PO) Take 1,200 mg by mouth daily.   Yes [provider]  Coenzyme Q10 (COQ10) 100 MG CAPS Take 100 mg by mouth daily.   Yes [provider]  ELIQUIS 5 MG TABS tablet TAKE 1 TABLET BY MOUTH TWICE A DAY Patient taking differently: Take 5 mg by mouth 2 (two) times daily. 04/18/20  Yes Turner, Eber Hong, MD  furosemide (LASIX) 20 MG tablet Take 1 tablet (20 mg total) by mouth every evening. 04/11/20 04/11/21 Yes Turner, Eber Hong, MD  Magnesium 250 MG TABS Take 250 mg by mouth daily.   Yes [provider]  Misc Natural Products (GLUCOSAMINE CHONDROITIN TRIPLE) TABS Take 1 tablet by mouth daily.   Yes [provider]  Multiple Vitamin (MULTIVITAMIN WITH MINERALS) TABS tablet Take 1 tablet by mouth daily.   Yes [provider]  Omega-3 Fatty Acids (FISH OIL) 1200 MG CAPS Take 1,200 mg by mouth daily.   Yes [provider]  pindolol (VISKEN) 10 MG tablet Take 0.5 tablets (5 mg total) by mouth 2 (two) times daily. 10/24/19  Yes Turner, Eber Hong, MD  Zinc 50 MG CAPS Take 100 mg by mouth 3 (three) times a week.    Yes [provider]    No Known Allergies  Social History   Socioeconomic History  . Marital status: Widowed    Spouse name: Not on file  . Number of children: Not on file  . Years of education: Not on file  . Highest education level: Not on file  Occupational History  . Occupation: retired  Tobacco Use  . Smoking status: Never Smoker  . Smokeless tobacco: Never Used  Vaping Use  . Vaping Use: Never used  Substance and Sexual Activity  . Alcohol use: No    Comment: occsaoinal  . Drug use: Yes    Types: Other-see comments  . Sexual activity: Not Currently  Other Topics Concern  . Not on file  Social History Narrative   Lives in [place] by himself but 2 dtrs nearby and help out when necessary.   Social Determinants of Health   Financial  Resource Strain: Not on file  Food Insecurity: Not on file  Transportation Needs: Not on file  Physical Activity: Not on file  Stress: Not on file  Social Connections: Not on file  Intimate Partner Violence: Not on file    Tobacco Use: Low Risk   . Smoking Tobacco Use: Never Smoker  . Smokeless Tobacco Use: Never Used   Social History   Substance and Sexual Activity  Alcohol Use No   Comment: occsaoinal    Family History  Problem Relation Age of Onset  . Coronary artery disease Other     Review of Systems  Constitutional: Negative for chills and fever.  HENT: Negative for congestion, sore throat and tinnitus.   Eyes: Negative for double vision, photophobia and pain.  Respiratory: Negative for cough, shortness of breath and wheezing.   Cardiovascular: Negative for chest pain, palpitations  and orthopnea.  Gastrointestinal: Negative for heartburn, nausea and vomiting.  Genitourinary: Negative for dysuria, frequency and urgency.  Musculoskeletal: Positive for joint pain.  Neurological: Negative for dizziness, weakness and headaches.   Objective:  Physical Exam: Well nourished and well developed.  General: Alert and oriented x3, cooperative and pleasant, no acute distress.  Head: normocephalic, atraumatic, neck supple.  Eyes: EOMI.  Respiratory: breath sounds clear in all fields, no wheezing, rales, or rhonchi. Cardiovascular: Regular rate and rhythm, no murmurs, gallops or rubs.  Abdomen: non-tender to palpation and soft, normoactive bowel sounds. Musculoskeletal:  Right Hip Exam:  The range of motion: Flexion to 100 degrees, Internal Rotation to 0 degrees, External Rotation to 0 degrees, abduction, and adduction to 0 degrees without discomfort.  There is no tenderness over the greater trochanteric bursa.   Calves soft and nontender. Motor function intact in LE. Strength 5/5 LE bilaterally. Neuro: Distal pulses 2+. Sensation to light touch intact in LE.  Imaging  Review Plain radiographs demonstrate severe degenerative joint disease of the right hip. The bone quality appears to be adequate for age and reported activity level.  Assessment/Plan:  End stage arthritis, right hip  The patient history, physical examination, clinical judgement of the provider and imaging studies are consistent with end stage degenerative joint disease of the right hip and total hip arthroplasty is deemed medically necessary. The treatment options including medical management, injection therapy, arthroscopy and arthroplasty were discussed at length. The risks and benefits of total hip arthroplasty were presented and reviewed. The risks due to aseptic loosening, infection, stiffness, dislocation/subluxation, thromboembolic complications and other imponderables were discussed. The patient acknowledged the explanation, agreed to proceed with the plan and consent was signed. Patient is being admitted for inpatient treatment for surgery, pain control, PT, OT, prophylactic antibiotics, VTE prophylaxis, progressive ambulation and ADLs and discharge planning.The patient is planning to be discharged home.   Patient's anticipated LOS is less than 2 midnights, meeting these requirements: - Lives within 1 hour of care - Has a competent adult at home to recover with post-op recover - NO history of  - Chronic pain requiring opiods  - Diabetes  - Coronary Artery Disease  - Heart failure  - Heart attack  - Stroke  - DVT/VTE  - Respiratory Failure/COPD  - Renal failure  - Anemia  - Advanced Liver disease  Therapy Plans: HEP Disposition: Home with daughter Planned DVT Prophylaxis: Eliquis 5 mg BID DME Needed: None PCP: Seward Carol, MD Cardiologist: Fransico Him, MD TXA: IV Allergies: NKDA Anesthesia Concerns: None BMI: 19 Last HgbA1c: Not diabetic Pharmacy: [facility])  Other:  - History of atrial fibrillation onset following TKA - Telemetry bed  - Patient was  instructed on what medications to stop prior to surgery. - Follow-up visit in 2 weeks with Dr. Wynelle Link - Begin physical therapy following surgery - Pre-operative lab work as pre-surgical testing - Prescriptions will be provided in hospital at time of discharge  Theresa Duty, PA-C Orthopedic Surgery [facility] [place]

## 2020-07-30 ENCOUNTER — Telehealth: Payer: Self-pay | Admitting: Cardiology

## 2020-07-30 NOTE — Telephone Encounter (Signed)
Patient is calling to follow up regarding R total hip arthroplasty. He states he has been notified that his procedure was cancelled due to a low red blood cell count. He requested a call back to discuss further.

## 2020-07-30 NOTE — Telephone Encounter (Signed)
Patient called and said his surgery that was scheduled for tomorrow (07/31/20) has been cancelled because his red blood cell count was low. Patient wanted to know what to do. Please advise

## 2020-07-30 NOTE — Telephone Encounter (Signed)
Left message for patient to call back  

## 2020-07-30 NOTE — Progress Notes (Signed)
Anesthesia Chart Review   Case: [id number] Date/Time: 07/31/20 1130   Procedure: TOTAL HIP ARTHROPLASTY ANTERIOR APPROACH (Right Hip) - 137min   Anesthesia type: Choice   Pre-op diagnosis: right hip osteoarthritis   [facility] 10 [facility]   Surgeons: Gaynelle Arabian, MD      DISCUSSION:79 y.o. never smoker with h/o HTN, PAF(on Eliquis and amiodarone), CAD (BMS to RCA), CHF, right hip OA scheduled for above procedure 07/31/20 with Dr. Gaynelle Arabian.   Pt last seen by cardiology 06/26/2020. Per OV note, "he has not had any anginal symptoms since I saw him last but has very limited mobility currently due to his orthopedic issues -he will require preop cardiac clearance for his surgery and has not had any ischemic workup in some time.He tells me that he would get short winded when mowing the yard this past Summer and unsure whether that was related to his chronic anemia.   I have recommended that we get a Lexiscan myoview to rule out ischemia prior to surgery"  Echo 07/17/20 EF 60-65%. Low risk stress test 07/22/2020  Pt advised to hold Eliquis 3 days prior to surgery.   Hemoglobin 9.5, forwarded to surgeon.   VS: BP 140/71   Pulse 60   Temp 37 C (Oral)   Resp 16   Ht 6\' 2"  (1.88 m)   SpO2 98%   BMI 19.64 kg/m   PROVIDERS: Seward Carol, MD is PCP   Fransico Him, MD is Cardiologist  LABS: Labs reviewed: Acceptable for surgery. (all labs ordered are listed, but only abnormal results are displayed)  Labs Reviewed  CBC - Abnormal; Notable for the following components:      Result Value   RBC 4.11 (*)    Hemoglobin 9.5 (*)    HCT 32.1 (*)    MCV 78.1 (*)    MCH 23.1 (*)    MCHC 29.6 (*)    RDW 18.8 (*)    All other components within normal limits  COMPREHENSIVE METABOLIC PANEL - Abnormal; Notable for the following components:   Total Protein 6.3 (*)    All other components within normal limits  SURGICAL PCR SCREEN  PROTIME-INR  APTT  TYPE AND SCREEN      IMAGES:   EKG: 10/11/2019 Rate 46 bpm  Sinus bradycardia  CV: Myocardial Perfusion 07/22/2020  The left ventricular ejection fraction is normal (55-65%).  Nuclear stress EF: 64%.  There was no ST segment deviation noted during stress.  Defect 1: There is a small defect of mild severity present in the apical inferior and apex location.  Findings consistent with prior myocardial infarction with peri-infarct ischemia.  This is a low risk study.   There is a very small defect in the inferior apical region that is slightly worse with stress. This was noted on prior scan in 2010. I cannot see images, but no changes based on report. Low risk study.  Echo 07/17/2020 IMPRESSIONS    1. Left ventricular ejection fraction, by estimation, is 60 to 65%. The  left ventricle has normal function. The left ventricle has no regional  wall motion abnormalities. Left ventricular diastolic parameters are  consistent with Grade I diastolic  dysfunction (impaired relaxation).  2. Right ventricular systolic function is normal. The right ventricular  size is normal.  3. Left atrial size was mildly dilated.  4. Right atrial size was mildly dilated.  5. The mitral valve is normal in structure. Mild mitral valve  regurgitation. No evidence of  mitral stenosis.  6. The aortic valve is normal in structure. Aortic valve regurgitation is  trivial. Mild aortic valve sclerosis is present, with no evidence of  aortic valve stenosis. Aortic regurgitation PHT measures 622 msec.  7. Aortic dilatation noted. There is borderline dilatation at the level  of the sinuses of Valsalva, measuring 41 mm.  8. The inferior vena cava is normal in size with greater than 50%  respiratory variability, suggesting right atrial pressure of 3 mmHg.  Past Medical History:  Diagnosis Date  . Ascending aorta dilatation (HCC)    6mm by chest CT angio 04/2018  . CAD (coronary artery disease)    a. 05/2006 Abnl  stress test w/ 2-18mm ST dep; b. 05/2006 Cath/PCI: LM nl, LAD 30-40ost, 20-30p, 90d (small), D1 nl, D2 nl, LCX nl, OM1/2 nl, RCA Ca2+, 28m/d (3.5x16 Liberty BMS & 3.5x12 Liberty BMS), EF 50%.  . Chronic diastolic CHF (congestive heart failure) ([place])    a. 10/2017 Echo: EF 50-55%, mild AI, sev dil LA, mod dil RA.  Marland Kitchen Chronic venous insufficiency   . HTN (hypertension)   . PAF (paroxysmal atrial fibrillation) ([place])    a. CHA2DS2VASc 5-->Eliquis; b. Prev WCT on flecainide;  c. 12/2017 s/p DCCV.  Marland Kitchen Pneumothorax   . Ventricular tachycardia (HCC)    a. possibly proarrhythmia from flecainide.    Past Surgical History:  Procedure Laterality Date  . CARDIOVERSION N/A 12/23/2017   Procedure: CARDIOVERSION;  Surgeon: Sueanne Margarita, MD;  Location: [facility] ENDOSCOPY;  Service: Cardiovascular;  Laterality: N/A;  . CARDIOVERSION N/A 03/08/2018   Procedure: CARDIOVERSION;  Surgeon: Sueanne Margarita, MD;  Location: [facility] ENDOSCOPY;  Service: Cardiovascular;  Laterality: N/A;  . INGUINAL HERNIA REPAIR     x2  . left total hip surgery    . TEE WITHOUT CARDIOVERSION N/A 03/08/2018   Procedure: TRANSESOPHAGEAL ECHOCARDIOGRAM (TEE);  Surgeon: Sueanne Margarita, MD;  Location: [facility] ENDOSCOPY;  Service: Cardiovascular;  Laterality: N/A;  . TOTAL KNEE ARTHROPLASTY Left 02/28/2018   Procedure: LEFT TOTAL KNEE ARTHROPLASTY;  Surgeon: Gaynelle Arabian, MD;  Location: [facility]S;  Service: Orthopedics;  Laterality: Left;  Marland Kitchen VEIN LIGATION AND STRIPPING Left 04/08/2020   Procedure: LEFT LOWER EXTREMITY VEIN LIGATION AND EXCISION OF ULCER;  Surgeon: Angelia Mould, MD;  Location: [facility] OR;  Service: Vascular;  Laterality: Left;    MEDICATIONS: . atorvastatin (LIPITOR) 20 MG tablet  . ELIQUIS 5 MG TABS tablet  . furosemide (LASIX) 20 MG tablet  . pindolol (VISKEN) 10 MG tablet  . acetaminophen (TYLENOL) 325 MG tablet  . amiodarone (PACERONE) 200 MG tablet  . ascorbic acid (VITAMIN C) 500 MG tablet  . Boswellia Serrata (BOSWELLIA PO)   . Coenzyme Q10 (COQ10) 100 MG CAPS  . Magnesium 250 MG TABS  . Misc Natural Products (GLUCOSAMINE CHONDROITIN TRIPLE) TABS  . Multiple Vitamin (MULTIVITAMIN WITH MINERALS) TABS tablet  . Omega-3 Fatty Acids (FISH OIL) 1200 MG CAPS  . Zinc 50 MG CAPS   No current facility-administered medications for this encounter.   Konrad Felix, PA-C [facility] Pre-Surgical Testing [phone]

## 2020-07-30 NOTE — Telephone Encounter (Signed)
   Primary Cardiologist: Fransico Him, MD  Chart reviewed as part of pre-operative protocol coverage. Given past medical history and time since last visit, based on ACC/AHA guidelines, [name] would be at acceptable risk for the planned procedure without further cardiovascular testing.  Recent echocardiogram and Myoview were reassuring.  He has been previously instructed to hold Eliquis for 3 days prior to the procedure.  The patient was advised that if he develops new symptoms prior to surgery to contact our office to arrange for a follow-up visit, and he verbalized understanding.  I will route this recommendation to the requesting party via Epic fax function and remove from pre-op pool.  Please call with questions.  [place], [place] 07/30/2020, 6:39 PM

## 2020-07-30 NOTE — Anesthesia Preprocedure Evaluation (Deleted)
Anesthesia Evaluation    Airway        Dental   Pulmonary           Cardiovascular hypertension,      Neuro/Psych    GI/Hepatic   Endo/Other    Renal/GU      Musculoskeletal   Abdominal   Peds  Hematology   Anesthesia Other Findings   Reproductive/Obstetrics                             Anesthesia Physical Anesthesia Plan  ASA:   Anesthesia Plan:    Post-op Pain Management:    Induction:   PONV Risk Score and Plan:   Airway Management Planned:   Additional Equipment:   Intra-op Plan:   Post-operative Plan:   Informed Consent:   Plan Discussed with:   Anesthesia Plan Comments: (See PAT note 07/29/20, Konrad Felix, PA-C)        Anesthesia Quick Evaluation

## 2020-07-30 NOTE — Telephone Encounter (Signed)
Spoke with the patient who is wanting to know lab work results. I advised him that the lab work that he had done yesterday was not ordered by Dr. Radford Pax, it looks like it was pre-op labs so they will have to be in contact with him in regards to those results. Patient thought that he had lab work done last week when he was here for his echocardiogram. Patient did not have lab work done at that time. Patient verbalized understanding.

## 2020-07-31 ENCOUNTER — Encounter (HOSPITAL_COMMUNITY): Admission: RE | Payer: Self-pay | Source: Home / Self Care

## 2020-07-31 ENCOUNTER — Ambulatory Visit (HOSPITAL_COMMUNITY): Admission: RE | Admit: 2020-07-31 | Payer: Medicare Other | Source: Home / Self Care | Admitting: Orthopedic Surgery

## 2020-07-31 LAB — TYPE AND SCREEN
ABO/RH(D): O POS
Antibody Screen: NEGATIVE

## 2020-07-31 SURGERY — ARTHROPLASTY, HIP, TOTAL, ANTERIOR APPROACH
Anesthesia: Choice | Site: Hip | Laterality: Right

## 2020-08-01 ENCOUNTER — Telehealth: Payer: Self-pay

## 2020-08-01 ENCOUNTER — Encounter (HOSPITAL_COMMUNITY): Payer: Medicare Other

## 2020-08-01 ENCOUNTER — Other Ambulatory Visit (HOSPITAL_COMMUNITY): Payer: Medicare Other

## 2020-08-01 NOTE — Telephone Encounter (Signed)
He needs to see his PCP to determine if other etiologies for persistent anemia

## 2020-08-01 NOTE — Telephone Encounter (Signed)
Patient's daughter states that the patient's hip surgery has been cancelled due to low hemoglobin. She states that they will not do the surgery if his hgb is below 11. She is wondering what they need to do to proceed in order for him to be able to have surgery done.

## 2020-08-02 ENCOUNTER — Other Ambulatory Visit (HOSPITAL_COMMUNITY): Payer: Medicare Other

## 2020-08-16 ENCOUNTER — Other Ambulatory Visit (HOSPITAL_COMMUNITY): Admission: RE | Admit: 2020-08-16 | Payer: Medicare Other | Source: Ambulatory Visit

## 2020-08-16 NOTE — Progress Notes (Signed)
Spoke with Mr. Sexson daughter, [place], she stated Mr. Tippin was seen by Judeth Porch. on 08/15/2020 hemoglobin was rechecked and faxed to Dr. Anne Fu office.  [place] also mentioned her dad had cardiac complications of supraventricular tachycardia requiring cardioversion after previous knee surgery and wanted to have cardiac consult to follow dad after surgery. I spoke with Claiborne Billings at Dr. Anne Fu office, Claiborne Billings stated Dr. Maureen Ralphs was already aware of making cardiology aware and she will fax to me the lab results from yesterday.

## 2020-08-17 ENCOUNTER — Other Ambulatory Visit (HOSPITAL_COMMUNITY)
Admission: RE | Admit: 2020-08-17 | Discharge: 2020-08-17 | Disposition: A | Discharge status: Home / Self Care | Payer: Medicare Other | Source: Ambulatory Visit | Attending: Orthopedic Surgery | Admitting: Orthopedic Surgery

## 2020-08-17 DIAGNOSIS — Z20822 Contact with and (suspected) exposure to covid-19: Secondary | ICD-10-CM | POA: Insufficient documentation

## 2020-08-17 DIAGNOSIS — Z01812 Encounter for preprocedural laboratory examination: Secondary | ICD-10-CM | POA: Insufficient documentation

## 2020-08-17 LAB — SARS CORONAVIRUS 2 (TAT 6-24 HRS): SARS Coronavirus 2: NEGATIVE

## 2020-08-19 ENCOUNTER — Encounter (HOSPITAL_COMMUNITY)
Admission: RE | Disposition: A | Discharge status: Home / Self Care | Payer: Self-pay | Source: Home / Self Care | Attending: Orthopedic Surgery

## 2020-08-19 ENCOUNTER — Ambulatory Visit (HOSPITAL_COMMUNITY): Payer: Medicare Other | Admitting: Physician Assistant

## 2020-08-19 ENCOUNTER — Inpatient Hospital Stay (HOSPITAL_COMMUNITY)
Admission: RE | Admit: 2020-08-19 | Discharge: 2020-08-22 | DRG: 470 | Disposition: A | Discharge status: Home Health | Payer: Medicare Other | Attending: Orthopedic Surgery | Admitting: Orthopedic Surgery

## 2020-08-19 ENCOUNTER — Encounter (HOSPITAL_COMMUNITY): Payer: Self-pay | Admitting: Orthopedic Surgery

## 2020-08-19 ENCOUNTER — Ambulatory Visit (HOSPITAL_COMMUNITY): Payer: Medicare Other

## 2020-08-19 ENCOUNTER — Other Ambulatory Visit: Payer: Self-pay

## 2020-08-19 ENCOUNTER — Observation Stay (HOSPITAL_COMMUNITY): Payer: Medicare Other

## 2020-08-19 DIAGNOSIS — E785 Hyperlipidemia, unspecified: Secondary | ICD-10-CM | POA: Diagnosis present

## 2020-08-19 DIAGNOSIS — Z96652 Presence of left artificial knee joint: Secondary | ICD-10-CM | POA: Diagnosis present

## 2020-08-19 DIAGNOSIS — I872 Venous insufficiency (chronic) (peripheral): Secondary | ICD-10-CM | POA: Diagnosis present

## 2020-08-19 DIAGNOSIS — I480 Paroxysmal atrial fibrillation: Secondary | ICD-10-CM | POA: Diagnosis present

## 2020-08-19 DIAGNOSIS — I2510 Atherosclerotic heart disease of native coronary artery without angina pectoris: Secondary | ICD-10-CM | POA: Diagnosis present

## 2020-08-19 DIAGNOSIS — M25551 Pain in right hip: Secondary | ICD-10-CM

## 2020-08-19 DIAGNOSIS — M169 Osteoarthritis of hip, unspecified: Secondary | ICD-10-CM | POA: Diagnosis present

## 2020-08-19 DIAGNOSIS — Z8249 Family history of ischemic heart disease and other diseases of the circulatory system: Secondary | ICD-10-CM

## 2020-08-19 DIAGNOSIS — I5032 Chronic diastolic (congestive) heart failure: Secondary | ICD-10-CM | POA: Diagnosis present

## 2020-08-19 DIAGNOSIS — M1611 Unilateral primary osteoarthritis, right hip: Principal | ICD-10-CM | POA: Diagnosis present

## 2020-08-19 DIAGNOSIS — Z79899 Other long term (current) drug therapy: Secondary | ICD-10-CM

## 2020-08-19 DIAGNOSIS — Z96649 Presence of unspecified artificial hip joint: Secondary | ICD-10-CM

## 2020-08-19 DIAGNOSIS — Z20822 Contact with and (suspected) exposure to covid-19: Secondary | ICD-10-CM | POA: Diagnosis present

## 2020-08-19 DIAGNOSIS — I110 Hypertensive heart disease with heart failure: Secondary | ICD-10-CM | POA: Diagnosis present

## 2020-08-19 DIAGNOSIS — Z7901 Long term (current) use of anticoagulants: Secondary | ICD-10-CM

## 2020-08-19 HISTORY — PX: TOTAL HIP ARTHROPLASTY: SHX124

## 2020-08-19 LAB — TYPE AND SCREEN
ABO/RH(D): O POS
Antibody Screen: NEGATIVE

## 2020-08-19 LAB — CBC
HCT: 34.7 % — ABNORMAL LOW (ref 39.0–52.0)
Hemoglobin: 10.3 g/dL — ABNORMAL LOW (ref 13.0–17.0)
MCH: 23.7 pg — ABNORMAL LOW (ref 26.0–34.0)
MCHC: 29.7 g/dL — ABNORMAL LOW (ref 30.0–36.0)
MCV: 80 fL (ref 80.0–100.0)
Platelets: 194 10*3/uL (ref 150–400)
RBC: 4.34 MIL/uL (ref 4.22–5.81)
RDW: 19.7 % — ABNORMAL HIGH (ref 11.5–15.5)
WBC: 7 10*3/uL (ref 4.0–10.5)
nRBC: 0 % (ref 0.0–0.2)

## 2020-08-19 LAB — PROTIME-INR
INR: 1.1 (ref 0.8–1.2)
Prothrombin Time: 14 seconds (ref 11.4–15.2)

## 2020-08-19 LAB — COMPREHENSIVE METABOLIC PANEL
ALT: 18 U/L (ref 0–44)
AST: 23 U/L (ref 15–41)
Albumin: 3.6 g/dL (ref 3.5–5.0)
Alkaline Phosphatase: 88 U/L (ref 38–126)
Anion gap: 11 (ref 5–15)
BUN: 21 mg/dL (ref 8–23)
CO2: 26 mmol/L (ref 22–32)
Calcium: 9.4 mg/dL (ref 8.9–10.3)
Chloride: 103 mmol/L (ref 98–111)
Creatinine, Ser: 0.9 mg/dL (ref 0.61–1.24)
GFR, Estimated: >60 mL/min (ref >60)
Glucose, Bld: 99 mg/dL (ref 70–99)
Potassium: 4.1 mmol/L (ref 3.5–5.1)
Sodium: 140 mmol/L (ref 135–145)
Total Bilirubin: 0.6 mg/dL (ref 0.3–1.2)
Total Protein: 6.6 g/dL (ref 6.5–8.1)

## 2020-08-19 LAB — APTT: aPTT: 37 seconds — ABNORMAL HIGH (ref 24–36)

## 2020-08-19 SURGERY — ARTHROPLASTY, HIP, TOTAL, ANTERIOR APPROACH
Anesthesia: Spinal | Site: Hip | Laterality: Right

## 2020-08-19 MED ORDER — AMIODARONE HCL 200 MG PO TABS
200.0000 mg | ORAL_TABLET | Freq: Every day | ORAL | Status: DC
Start: 2020-08-20 — End: 2020-08-22
  Administered 2020-08-20 – 2020-08-22 (×3): 200 mg via ORAL
  Filled 2020-08-19 (×3): qty 1

## 2020-08-19 MED ORDER — ONDANSETRON HCL 4 MG/2ML IJ SOLN
INTRAMUSCULAR | Status: AC
  Filled 2020-08-19: qty 2

## 2020-08-19 MED ORDER — POVIDONE-IODINE 10 % EX SWAB
2.0000 "application " | Freq: Once | CUTANEOUS | Status: AC
  Administered 2020-08-19: 2 via TOPICAL

## 2020-08-19 MED ORDER — WATER FOR IRRIGATION, STERILE IR SOLN
Status: DC | PRN
  Administered 2020-08-19: 2000 mL

## 2020-08-19 MED ORDER — ACETAMINOPHEN 325 MG PO TABS
325.0000 mg | ORAL_TABLET | Freq: Four times a day (QID) | ORAL | Status: DC | PRN
  Administered 2020-08-20 – 2020-08-22 (×3): 650 mg via ORAL
  Filled 2020-08-19 (×3): qty 2

## 2020-08-19 MED ORDER — BISACODYL 10 MG RE SUPP: 10.0000 mg | Freq: Every day | RECTAL | Status: DC | PRN

## 2020-08-19 MED ORDER — ONDANSETRON HCL 4 MG/2ML IJ SOLN: 4.0000 mg | Freq: Four times a day (QID) | INTRAMUSCULAR | Status: DC | PRN

## 2020-08-19 MED ORDER — PINDOLOL 5 MG PO TABS
5.0000 mg | ORAL_TABLET | Freq: Two times a day (BID) | ORAL | Status: DC
  Administered 2020-08-19 – 2020-08-22 (×6): 5 mg via ORAL
  Filled 2020-08-19 (×7): qty 1

## 2020-08-19 MED ORDER — METHOCARBAMOL 1000 MG/10ML IJ SOLN
500.0000 mg | Freq: Four times a day (QID) | INTRAVENOUS | Status: DC | PRN
  Filled 2020-08-19: qty 5

## 2020-08-19 MED ORDER — DEXAMETHASONE SODIUM PHOSPHATE 10 MG/ML IJ SOLN: 8.0000 mg | Freq: Once | INTRAMUSCULAR | Status: AC

## 2020-08-19 MED ORDER — BUPIVACAINE HCL 0.25 % IJ SOLN
INTRAMUSCULAR | Status: DC | PRN
  Administered 2020-08-19: 30 mL

## 2020-08-19 MED ORDER — PROPOFOL 1000 MG/100ML IV EMUL
INTRAVENOUS | Status: AC
  Filled 2020-08-19: qty 100

## 2020-08-19 MED ORDER — ATORVASTATIN CALCIUM 20 MG PO TABS
20.0000 mg | ORAL_TABLET | Freq: Every day | ORAL | Status: DC
Start: 2020-08-20 — End: 2020-08-22
  Administered 2020-08-20 – 2020-08-22 (×3): 20 mg via ORAL
  Filled 2020-08-19 (×3): qty 1

## 2020-08-19 MED ORDER — PHENYLEPHRINE HCL-NACL 10-0.9 MG/250ML-% IV SOLN
INTRAVENOUS | Status: DC | PRN
  Administered 2020-08-19: 20 ug/min via INTRAVENOUS

## 2020-08-19 MED ORDER — CEFAZOLIN SODIUM-DEXTROSE 2-4 GM/100ML-% IV SOLN
2.0000 g | Freq: Four times a day (QID) | INTRAVENOUS | Status: AC
  Administered 2020-08-19 – 2020-08-20 (×2): 2 g via INTRAVENOUS
  Filled 2020-08-19 (×3): qty 100

## 2020-08-19 MED ORDER — ACETAMINOPHEN 10 MG/ML IV SOLN
1000.0000 mg | Freq: Four times a day (QID) | INTRAVENOUS | Status: DC
  Administered 2020-08-19: 1000 mg via INTRAVENOUS
  Filled 2020-08-19: qty 100

## 2020-08-19 MED ORDER — CHLORHEXIDINE GLUCONATE CLOTH 2 % EX PADS: 6.0000 | MEDICATED_PAD | Freq: Every day | CUTANEOUS | Status: DC

## 2020-08-19 MED ORDER — TRAMADOL HCL 50 MG PO TABS
50.0000 mg | ORAL_TABLET | Freq: Four times a day (QID) | ORAL | Status: DC | PRN
  Administered 2020-08-19 – 2020-08-22 (×5): 50 mg via ORAL
  Filled 2020-08-19 (×5): qty 1

## 2020-08-19 MED ORDER — PROPOFOL 500 MG/50ML IV EMUL
INTRAVENOUS | Status: DC | PRN
  Administered 2020-08-19: 50 ug/kg/min via INTRAVENOUS

## 2020-08-19 MED ORDER — MORPHINE SULFATE (PF) 2 MG/ML IV SOLN
0.5000 mg | INTRAVENOUS | Status: DC | PRN
  Administered 2020-08-20: 1 mg via INTRAVENOUS
  Filled 2020-08-19: qty 1

## 2020-08-19 MED ORDER — PHENOL 1.4 % MT LIQD: 1.0000 | OROMUCOSAL | Status: DC | PRN

## 2020-08-19 MED ORDER — MENTHOL 3 MG MT LOZG: 1.0000 | LOZENGE | OROMUCOSAL | Status: DC | PRN

## 2020-08-19 MED ORDER — BUPIVACAINE HCL (PF) 0.25 % IJ SOLN
INTRAMUSCULAR | Status: AC
  Filled 2020-08-19: qty 30

## 2020-08-19 MED ORDER — DEXAMETHASONE SODIUM PHOSPHATE 10 MG/ML IJ SOLN
INTRAMUSCULAR | Status: AC
  Filled 2020-08-19: qty 1

## 2020-08-19 MED ORDER — HYDROCODONE-ACETAMINOPHEN 5-325 MG PO TABS
1.0000 | ORAL_TABLET | ORAL | Status: DC | PRN
  Administered 2020-08-19: 1 via ORAL
  Administered 2020-08-19 – 2020-08-21 (×5): 2 via ORAL
  Filled 2020-08-19 (×2): qty 2
  Filled 2020-08-19 (×2): qty 1
  Filled 2020-08-19 (×2): qty 2
  Filled 2020-08-19: qty 1

## 2020-08-19 MED ORDER — SODIUM CHLORIDE 0.9 % IV SOLN: INTRAVENOUS | Status: DC

## 2020-08-19 MED ORDER — LACTATED RINGERS IV SOLN: INTRAVENOUS | Status: DC

## 2020-08-19 MED ORDER — CEFAZOLIN SODIUM-DEXTROSE 2-4 GM/100ML-% IV SOLN
2.0000 g | INTRAVENOUS | Status: AC
  Administered 2020-08-19: 2 g via INTRAVENOUS
  Filled 2020-08-19: qty 100

## 2020-08-19 MED ORDER — 0.9 % SODIUM CHLORIDE (POUR BTL) OPTIME
TOPICAL | Status: DC | PRN
  Administered 2020-08-19: 1000 mL

## 2020-08-19 MED ORDER — MAGNESIUM CITRATE PO SOLN: 1.0000 | Freq: Once | ORAL | Status: DC | PRN

## 2020-08-19 MED ORDER — METOCLOPRAMIDE HCL 5 MG PO TABS: 5.0000 mg | ORAL_TABLET | Freq: Three times a day (TID) | ORAL | Status: DC | PRN

## 2020-08-19 MED ORDER — DEXAMETHASONE SODIUM PHOSPHATE 10 MG/ML IJ SOLN
10.0000 mg | Freq: Once | INTRAMUSCULAR | Status: AC
  Administered 2020-08-20: 10 mg via INTRAVENOUS
  Filled 2020-08-19: qty 1

## 2020-08-19 MED ORDER — FUROSEMIDE 20 MG PO TABS
20.0000 mg | ORAL_TABLET | Freq: Every evening | ORAL | Status: DC
  Administered 2020-08-20 – 2020-08-21 (×2): 20 mg via ORAL
  Filled 2020-08-19 (×2): qty 1

## 2020-08-19 MED ORDER — ONDANSETRON HCL 4 MG/2ML IJ SOLN
INTRAMUSCULAR | Status: DC | PRN
Start: 2020-08-19 — End: 2020-08-19
  Administered 2020-08-19: 4 mg via INTRAVENOUS

## 2020-08-19 MED ORDER — TRANEXAMIC ACID-NACL 1000-0.7 MG/100ML-% IV SOLN
1000.0000 mg | INTRAVENOUS | Status: AC
  Administered 2020-08-19: 1000 mg via INTRAVENOUS
  Filled 2020-08-19: qty 100

## 2020-08-19 MED ORDER — ONDANSETRON HCL 4 MG PO TABS: 4.0000 mg | ORAL_TABLET | Freq: Four times a day (QID) | ORAL | Status: DC | PRN

## 2020-08-19 MED ORDER — LIDOCAINE HCL (PF) 2 % IJ SOLN
INTRAMUSCULAR | Status: AC
  Filled 2020-08-19: qty 5

## 2020-08-19 MED ORDER — METOCLOPRAMIDE HCL 5 MG/ML IJ SOLN: 5.0000 mg | Freq: Three times a day (TID) | INTRAMUSCULAR | Status: DC | PRN

## 2020-08-19 MED ORDER — METHOCARBAMOL 500 MG PO TABS
500.0000 mg | ORAL_TABLET | Freq: Four times a day (QID) | ORAL | Status: DC | PRN
  Administered 2020-08-19: 500 mg via ORAL
  Filled 2020-08-19: qty 1

## 2020-08-19 MED ORDER — HYDROMORPHONE HCL 1 MG/ML IJ SOLN: 0.2500 mg | INTRAMUSCULAR | Status: DC | PRN

## 2020-08-19 MED ORDER — CHLORHEXIDINE GLUCONATE 0.12 % MT SOLN
15.0000 mL | Freq: Once | OROMUCOSAL | Status: AC
  Administered 2020-08-19: 15 mL via OROMUCOSAL

## 2020-08-19 MED ORDER — DOCUSATE SODIUM 100 MG PO CAPS
100.0000 mg | ORAL_CAPSULE | Freq: Two times a day (BID) | ORAL | Status: DC
  Administered 2020-08-19 – 2020-08-22 (×6): 100 mg via ORAL
  Filled 2020-08-19 (×6): qty 1

## 2020-08-19 MED ORDER — POLYETHYLENE GLYCOL 3350 17 G PO PACK
17.0000 g | PACK | Freq: Every day | ORAL | Status: DC | PRN
  Administered 2020-08-21: 17 g via ORAL
  Filled 2020-08-19 (×2): qty 1

## 2020-08-19 MED ORDER — APIXABAN 2.5 MG PO TABS
2.5000 mg | ORAL_TABLET | Freq: Two times a day (BID) | ORAL | Status: DC
  Administered 2020-08-20 – 2020-08-22 (×5): 2.5 mg via ORAL
  Filled 2020-08-19 (×5): qty 1

## 2020-08-19 MED ORDER — ORAL CARE MOUTH RINSE: 15.0000 mL | Freq: Once | OROMUCOSAL | Status: AC

## 2020-08-19 MED ORDER — BUPIVACAINE IN DEXTROSE 0.75-8.25 % IT SOLN
INTRATHECAL | Status: DC | PRN
  Administered 2020-08-19: 1.6 mL via INTRATHECAL

## 2020-08-19 MED ORDER — PROPOFOL 10 MG/ML IV BOLUS
INTRAVENOUS | Status: DC | PRN
  Administered 2020-08-19 (×2): 40 mg via INTRAVENOUS
  Administered 2020-08-19: 20 mg via INTRAVENOUS

## 2020-08-19 SURGICAL SUPPLY — 43 items
ARTICULEZE HEAD (Hips) ×2 IMPLANT
BAG DECANTER FOR FLEXI CONT (MISCELLANEOUS) IMPLANT
BAG ZIPLOCK 12X15 (MISCELLANEOUS) IMPLANT
BLADE SAG 18X100X1.27 (BLADE) ×2 IMPLANT
COVER PERINEAL POST (MISCELLANEOUS) ×2 IMPLANT
COVER SURGICAL LIGHT HANDLE (MISCELLANEOUS) ×2 IMPLANT
COVER WAND RF STERILE (DRAPES) ×2 IMPLANT
CUP ACETBLR 54 OD PINNACLE (Hips) ×2 IMPLANT
DECANTER SPIKE VIAL GLASS SM (MISCELLANEOUS) ×2 IMPLANT
DRAPE STERI IOBAN 125X83 (DRAPES) ×2 IMPLANT
DRAPE U-SHAPE 47X51 STRL (DRAPES) ×4 IMPLANT
DRSG AQUACEL AG ADV 3.5X10 (GAUZE/BANDAGES/DRESSINGS) ×2 IMPLANT
DURAPREP 26ML APPLICATOR (WOUND CARE) ×2 IMPLANT
ELECT REM PT RETURN 15FT ADLT (MISCELLANEOUS) ×2 IMPLANT
EVACUATOR 1/8 PVC DRAIN (DRAIN) IMPLANT
GLOVE BIO SURGEON STRL SZ8 (GLOVE) ×4 IMPLANT
GLOVE BIOGEL PI IND STRL 8.5 (GLOVE) IMPLANT
GLOVE BIOGEL PI INDICATOR 8.5 (GLOVE)
GLOVE SRG 8 PF TXTR STRL LF DI (GLOVE) ×1 IMPLANT
GLOVE SURG ENC MOIS LTX SZ6 (GLOVE) IMPLANT
GLOVE SURG ENC MOIS LTX SZ7 (GLOVE) IMPLANT
GLOVE SURG UNDER POLY LF SZ6.5 (GLOVE) IMPLANT
GLOVE SURG UNDER POLY LF SZ8 (GLOVE) ×2
GOWN STRL REUS W/TWL LRG LVL3 (GOWN DISPOSABLE) ×2 IMPLANT
GOWN STRL REUS W/TWL XL LVL3 (GOWN DISPOSABLE) IMPLANT
HEAD ARTICULEZE (Hips) ×1 IMPLANT
HOLDER FOLEY CATH W/STRAP (MISCELLANEOUS) ×2 IMPLANT
KIT TURNOVER KIT A (KITS) IMPLANT
LINER MARATHON NEUT +4X54X36 (Hips) ×2 IMPLANT
MANIFOLD NEPTUNE II (INSTRUMENTS) ×2 IMPLANT
PACK ANTERIOR HIP CUSTOM (KITS) ×2 IMPLANT
PENCIL SMOKE EVACUATOR COATED (MISCELLANEOUS) ×2 IMPLANT
STEM FEMORAL SZ8 STD ACTIS (Stem) ×2 IMPLANT
STRIP CLOSURE SKIN 1/2X4 (GAUZE/BANDAGES/DRESSINGS) ×4 IMPLANT
SUT ETHIBOND NAB CT1 #1 30IN (SUTURE) ×2 IMPLANT
SUT MNCRL AB 4-0 PS2 18 (SUTURE) ×2 IMPLANT
SUT STRATAFIX 0 PDS 27 VIOLET (SUTURE) ×2
SUT VIC AB 2-0 CT1 27 (SUTURE) ×4
SUT VIC AB 2-0 CT1 TAPERPNT 27 (SUTURE) ×2 IMPLANT
SUTURE STRATFX 0 PDS 27 VIOLET (SUTURE) ×1 IMPLANT
SYR 50ML LL SCALE MARK (SYRINGE) IMPLANT
TRAY FOLEY MTR SLVR 16FR STAT (SET/KITS/TRAYS/PACK) ×2 IMPLANT
TUBE SUCTION HIGH CAP CLEAR NV (SUCTIONS) ×2 IMPLANT

## 2020-08-19 NOTE — Op Note (Signed)
OPERATIVE REPORT- TOTAL HIP ARTHROPLASTY   PREOPERATIVE DIAGNOSIS: Osteoarthritis of the Right hip.   POSTOPERATIVE DIAGNOSIS: Osteoarthritis of the Right  hip.   PROCEDURE: Right total hip arthroplasty, anterior approach.   SURGEON: Gaynelle Arabian, MD   ASSISTANT: Fenton Foy, PA-C  ANESTHESIA:  Spinal  ESTIMATED BLOOD LOSS:-300 mL    DRAINS: Hemovac x1.   COMPLICATIONS: None   CONDITION: PACU - hemodynamically stable.   BRIEF CLINICAL NOTE: [name] is a 80 y.o. male who has advanced end-  stage arthritis of their Right  hip with progressively worsening pain and  dysfunction.The patient has failed nonoperative management and presents for  total hip arthroplasty.   PROCEDURE IN DETAIL: After successful administration of spinal  anesthetic, the traction boots for the [facility] bed were placed on both  feet and the patient was placed onto the [facility] bed, boots placed into the leg  holders. The Right hip was then isolated from the perineum with plastic  drapes and prepped and draped in the usual sterile fashion. ASIS and  greater trochanter were marked and a oblique incision was made, starting  at about 1 cm lateral and 2 cm distal to the ASIS and coursing towards  the anterior cortex of the femur. The skin was cut with a 10 blade  through subcutaneous tissue to the level of the fascia overlying the  tensor fascia lata muscle. The fascia was then incised in line with the  incision at the junction of the anterior third and posterior 2/3rd. The  muscle was teased off the fascia and then the interval between the TFL  and the rectus was developed. The Hohmann retractor was then placed at  the top of the femoral neck over the capsule. The vessels overlying the  capsule were cauterized and the fat on top of the capsule was removed.  A Hohmann retractor was then placed anterior underneath the rectus  femoris to give exposure to the entire anterior capsule. A T-shaped   capsulotomy was performed. The edges were tagged and the femoral head  was identified.       Osteophytes are removed off the superior acetabulum.  The femoral neck was then cut in situ with an oscillating saw. Traction  was then applied to the left lower extremity utilizing the [facility]  traction. The femoral head was then removed. Retractors were placed  around the acetabulum and then circumferential removal of the labrum was  performed. Osteophytes were also removed. Reaming starts at 47 mm to  medialize and  Increased in 2 mm increments to 53 mm. We reamed in  approximately 40 degrees of abduction, 20 degrees anteversion. A 54 mm  pinnacle acetabular shell was then impacted in anatomic position under  fluoroscopic guidance with excellent purchase. We did not need to place  any additional dome screws. A 36 mm neutral + 4 marathon liner was then  placed into the acetabular shell.       The femoral lift was then placed along the lateral aspect of the femur  just distal to the vastus ridge. The leg was  externally rotated and capsule  was stripped off the inferior aspect of the femoral neck down to the  level of the lesser trochanter, this was done with electrocautery. The femur was lifted after this was performed. The  leg was then placed in an extended and adducted position essentially delivering the femur. We also removed the capsule superiorly and the piriformis from the piriformis  fossa to gain excellent exposure of the  proximal femur. Rongeur was used to remove some cancellous bone to get  into the lateral portion of the proximal femur for placement of the  initial starter reamer. The starter broaches was placed  the starter broach  and was shown to go down the center of the canal. Broaching  with the Actis system was then performed starting at size 0  coursing  Up to size 8. A size 8 had excellent torsional and rotational  and axial stability. The trial standard offset neck was then  placed  with a 36 + 5 trial head. The hip was then reduced. We confirmed that  the stem was in the canal both on AP and lateral x-rays. It also has excellent sizing. The hip was reduced with outstanding stability through full extension and full external rotation.. AP pelvis was taken and the leg lengths were measured and found to be equal. Hip was then dislocated again and the femoral head and neck removed. The  femoral broach was removed. Size 8 Actis stem with a standard offset  neck was then impacted into the femur following native anteversion. Has  excellent purchase in the canal. Excellent torsional and rotational and  axial stability. It is confirmed to be in the canal on AP and lateral  fluoroscopic views. The 36 + 5 metal head was placed and the hip  reduced with outstanding stability. Again AP pelvis was taken and it  confirmed that the leg lengths were equal. The wound was then copiously  irrigated with saline solution and the capsule reattached and repaired  with Ethibond suture. 30 ml of .25% Bupivicaine was  injected into the capsule and into the edge of the tensor fascia lata as well as subcutaneous tissue. The fascia overlying the tensor fascia lata was then closed with a running #1 V-Loc. Subcu was closed with interrupted 2-0 Vicryl and subcuticular running 4-0 Monocryl. Incision was cleaned  and dried. Steri-Strips and a bulky sterile dressing applied. Hemovac  drain was hooked to suction and then the patient was awakened and transported to  recovery in stable condition.        Please note that a surgical assistant was a medical necessity for this procedure to perform it in a safe and expeditious manner. Assistant was necessary to provide appropriate retraction of vital neurovascular structures and to prevent femoral fracture and allow for anatomic placement of the prosthesis.  Gaynelle Arabian, M.D.

## 2020-08-19 NOTE — Interval H&P Note (Signed)
History and Physical Interval Note:  08/19/2020 8:33 AM  [name]  has presented today for surgery, with the diagnosis of right hip osteoarthritis.  The various methods of treatment have been discussed with the patient and family. After consideration of risks, benefits and other options for treatment, the patient has consented to  Procedure(s) with comments: [place] (Right) - 137min as a surgical intervention.  The patient's history has been reviewed, patient examined, no change in status, stable for surgery.  I have reviewed the patient's chart and labs.  Questions were answered to the patient's satisfaction.     Pilar Plate Tiera Mensinger

## 2020-08-19 NOTE — Care Plan (Signed)
Ortho Bundle Case Management Note  Patient Details  Name: [name] MRN: [id number] Date of Birth: 11/24/1940                  R THA on 08-19-20 DCP: Home with hired caregiver. 3 story home with 1 ste. DME: No needs. Has a RW and 3-in-1. PT: HEP   DME Arranged:  N/A DME Agency:     HH Arranged:    [place] Agency:     Additional Comments: Please contact me with any questions of if this plan should need to change.  Marianne Sofia, RN,CCM [facility]  [phone] 08/19/2020, 2:11 PM

## 2020-08-19 NOTE — Progress Notes (Signed)
Patient insists upon sitting in the chair. RN and staff offered to assist the patient back to bed but the patient wants to stay in the chair. Will keep monitoring the patient.

## 2020-08-19 NOTE — Anesthesia Preprocedure Evaluation (Addendum)
Anesthesia Evaluation  Patient identified by MRN, date of birth, ID band Patient awake    Reviewed: Allergy & Precautions, H&P , NPO status , Patient's Chart, lab work & pertinent test results  Airway Mallampati: II  TM Distance: >3 FB Neck ROM: Full    Dental no notable dental hx. (+) Teeth Intact, Dental Advisory Given   Pulmonary neg pulmonary ROS,    Pulmonary exam normal breath sounds clear to auscultation       Cardiovascular hypertension, Pt. on medications + CAD, + Cardiac Stents and +CHF  + dysrhythmias Atrial Fibrillation  Rhythm:Regular Rate:Normal     Neuro/Psych negative neurological ROS  negative psych ROS   GI/Hepatic negative GI ROS, Neg liver ROS,   Endo/Other  negative endocrine ROS  Renal/GU negative Renal ROS  negative genitourinary   Musculoskeletal  (+) Arthritis , Osteoarthritis,    Abdominal   Peds  Hematology negative hematology ROS (+)   Anesthesia Other Findings   Reproductive/Obstetrics negative OB ROS                            Anesthesia Physical Anesthesia Plan  ASA: III  Anesthesia Plan: Spinal   Post-op Pain Management:    Induction: Intravenous  PONV Risk Score and Plan: 2 and Propofol infusion, Ondansetron and Dexamethasone  Airway Management Planned: Simple Face Mask  Additional Equipment:   Intra-op Plan:   Post-operative Plan:   Informed Consent: I have reviewed the patients History and Physical, chart, labs and discussed the procedure including the risks, benefits and alternatives for the proposed anesthesia with the patient or authorized representative who has indicated his/her understanding and acceptance.     Dental advisory given  Plan Discussed with: CRNA  Anesthesia Plan Comments:         Anesthesia Quick Evaluation

## 2020-08-19 NOTE — Anesthesia Procedure Notes (Signed)
Spinal  Patient location during procedure: OR Start time: 08/19/2020 11:45 AM End time: 08/19/2020 11:52 AM Staffing Performed: resident/CRNA  Resident/CRNA: Milford Cage, CRNA Preanesthetic Checklist Completed: patient identified, IV checked, site marked, risks and benefits discussed, surgical consent, monitors and equipment checked, pre-op evaluation and timeout performed Spinal Block Patient position: sitting Prep: DuraPrep Patient monitoring: heart rate, cardiac monitor, continuous pulse ox and blood pressure Approach: midline Location: L3-4 Injection technique: single-shot Needle Needle type: Sprotte  Needle gauge: 24 G Needle length: 9 cm Assessment Sensory level: T4 Additional Notes IV functioning, monitors applied to pt. Expiration date of kit checked and confirmed to be in date. Sterile prep and drape, hand hygiene and sterile gloved used. Pt was positioned and spine was prepped in sterile fashion. Skin was anesthetized with lidocaine. Free flow of clear CSF obtained prior to injecting local anesthetic into CSF x 1 attempt. Spinal needle aspirated freely following injection. Needle was carefully withdrawn, and pt tolerated procedure well. Loss of motor and sensory on exam post injection.

## 2020-08-19 NOTE — Transfer of Care (Signed)
Immediate Anesthesia Transfer of Care Note  Patient: [name]  Procedure(s) Performed: TOTAL HIP ARTHROPLASTY ANTERIOR APPROACH (Right Hip)  Patient Location: PACU  Anesthesia Type:Spinal  Level of Consciousness: awake, alert , oriented and patient cooperative  Airway & Oxygen Therapy: Patient Spontanous Breathing and Patient connected to face mask oxygen  Post-op Assessment: Report given to RN and Post -op Vital signs reviewed and stable  Post vital signs: Reviewed and stable  Last Vitals:  Vitals Value Taken Time  BP 151/101 08/19/20 1231  Temp    Pulse 49 08/19/20 1233  Resp 16 08/19/20 1233  SpO2 100 % 08/19/20 1233  Vitals shown include unvalidated device data.  Last Pain:  Vitals:   08/19/20 0907  TempSrc:   PainSc: 2          Complications: No complications documented.

## 2020-08-19 NOTE — Anesthesia Postprocedure Evaluation (Signed)
Anesthesia Post Note  Patient: [name]  Procedure(s) Performed: TOTAL HIP ARTHROPLASTY ANTERIOR APPROACH (Right Hip)     Patient location during evaluation: PACU Anesthesia Type: Spinal Level of consciousness: oriented and awake and alert Pain management: pain level controlled Vital Signs Assessment: post-procedure vital signs reviewed and stable Respiratory status: spontaneous breathing and respiratory function stable Cardiovascular status: blood pressure returned to baseline and stable Postop Assessment: no headache, no backache, no apparent nausea or vomiting, spinal receding and patient able to bend at knees Anesthetic complications: no   No complications documented.  Last Vitals:  Vitals:   08/19/20 1321 08/19/20 1322  BP:    Pulse: (!) 50 (!) 48  Resp: 17 12  Temp:    SpO2: 100% 98%    Last Pain:  Vitals:   08/19/20 1315  TempSrc:   PainSc: 0-No pain                 [name],W. EDMOND

## 2020-08-19 NOTE — Evaluation (Signed)
Physical Therapy Evaluation Patient Details Name: [name] MRN: [id number] DOB: 1940-09-09 Today's Date: 08/19/2020   History of Present Illness  patient is an 80 yo male s/p Rt THA anterior approach on 08/19/2020 with PMH significant for v-tach, pneumothroax, CAD, venous insufficiency, CHF, HTN, Paroxysmal a-fib, Lt TKA (2019) and Lt THA.  Clinical Impression  Pt is an 80yo male s/p Rt THA POD 0. Pt reports that he is independent with mobility at baseline. PT provided MIN assist for progression of Rt LE to EOB and cues for hand placement on bed rails during supine to sit transfer. Pt required MIN guard for safety/steadying and verbal cues for sit to stand transfer from elevated EOB. Pt required MIN assist progressing to MIN guard for ambulation 64ft with verbal cues for step to gait pattern and to maintain safe proximity to RW. PT reviewed exercise for promotion of DVT prevention with pt and pt demonstrated understanding. Pt lives alone and has a daughter who is available to assist intermittently.  Recommend home with family support. Pt will benefit from skilled PT to increase independence and safety with mobility. Acute therapy to follow up during stay.      Follow Up Recommendations Follow surgeon's recommendation for DC plan and follow-up therapies;Home health PT    Equipment Recommendations  None recommended by PT (pt owns RW)    Recommendations for Other Services       Precautions / Restrictions Precautions Precautions: Fall Restrictions Weight Bearing Restrictions: No Other Position/Activity Restrictions: WBAT      Mobility  Bed Mobility Overal bed mobility: Needs Assistance Bed Mobility: Supine to Sit     Supine to sit: Min assist;HOB elevated     General bed mobility comments: pt required MIN assist for progression of Rt LE to EOB and cues for hand placment on bed rails to reach EOB. Pt able to use B UEs to scoot toward EOB.    Transfers Overall transfer  level: Needs assistance Equipment used: Rolling walker (2 wheeled) Transfers: Sit to/from Stand Sit to Stand: Min guard;From elevated surface         General transfer comment: MIN guard for safety/steadying and cues for safe hand placement  Ambulation/Gait Ambulation/Gait assistance: Min assist;Min guard Gait Distance (Feet): 50 Feet Assistive device: Rolling walker (2 wheeled) Gait Pattern/deviations: Step-to pattern;Decreased stride length;Decreased weight shift to right Gait velocity: decr   General Gait Details: pt performed pre gait with standing marches and B UEs on RW and MIN guard from therapist for safety/steadying. Pt required MIN asisst-MIN guard for ambulation with cues to maintain safe proximity to RW and for step to gait pattern.  Stairs            Wheelchair Mobility    Modified Rankin (Stroke Patients Only)       Balance Overall balance assessment: Needs assistance Sitting-balance support: Bilateral upper extremity supported;Feet supported Sitting balance-Leahy Scale: Fair Sitting balance - Comments: pt required use of UEs on bed to maintain upright seated balance while moving to EOB   Standing balance support: Bilateral upper extremity supported Standing balance-Leahy Scale: Poor Standing balance comment: use of external support                             Pertinent Vitals/Pain Pain Assessment: 0-10 Pain Score: 8  Pain Location: Rt hip Pain Descriptors / Indicators: Discomfort;Sore;Tender Pain Intervention(s): Limited activity within patient's tolerance;Monitored during session;Repositioned;Ice applied    Home Living Family/patient  expects to be discharged to:: Private residence Living Arrangements: Alone Available Help at Discharge: Family Type of Home: House Home Access: Stairs to enter Entrance Stairs-Rails: None Entrance Stairs-Number of Steps: 1 Home Layout: Multi-level Home Equipment: Environmental consultant - 2 wheels;Cane - single  point Additional Comments: has a daughter in [place]    Prior Function Level of Independence: Independent with assistive device(s)         Comments: pt using SPC for gait PTA     Hand Dominance   Dominant Hand: Left    Extremity/Trunk Assessment   Upper Extremity Assessment Upper Extremity Assessment: Overall WFL for tasks assessed    Lower Extremity Assessment Lower Extremity Assessment: RLE deficits/detail RLE Deficits / Details: pt with 4/5 Rt quad set and bil DF/PF strength. RLE Sensation: WNL RLE Coordination: WNL    Cervical / Trunk Assessment Cervical / Trunk Assessment: Normal  Communication   Communication: No difficulties  Cognition Arousal/Alertness: Awake/alert Behavior During Therapy: WFL for tasks assessed/performed Overall Cognitive Status: Within Functional Limits for tasks assessed                                        General Comments      Exercises Total Joint Exercises Ankle Circles/Pumps: AROM;Both;20 reps;Seated   Assessment/Plan    PT Assessment Patient needs continued PT services  PT Problem List Decreased strength;Decreased range of motion;Decreased activity tolerance;Decreased balance;Decreased mobility;Decreased knowledge of use of DME;Pain;Decreased safety awareness       PT Treatment Interventions Gait training;DME instruction;Stair training;Functional mobility training;Therapeutic activities;Therapeutic exercise;Balance training;Patient/family education    PT Goals (Current goals can be found in the Care Plan section)  Acute Rehab PT Goals Patient Stated Goal: decrease pain levels PT Goal Formulation: With patient Time For Goal Achievement: 08/26/20 Potential to Achieve Goals: Good    Frequency 7X/week   Barriers to discharge Decreased caregiver support pt lives alone and his daughter works and has children to care for    Co-evaluation               AM-PAC PT "6 Clicks" Mobility  Outcome  Measure Help needed turning from your back to your side while in a flat bed without using bedrails?: A Little Help needed moving from lying on your back to sitting on the side of a flat bed without using bedrails?: A Little Help needed moving to and from a bed to a chair (including a wheelchair)?: A Little Help needed standing up from a chair using your arms (e.g., wheelchair or bedside chair)?: A Little Help needed to walk in hospital room?: A Little Help needed climbing 3-5 steps with a railing? : A Little 6 Click Score: 18    End of Session Equipment Utilized During Treatment: Gait belt Activity Tolerance: Patient tolerated treatment well Patient left: in chair;with call bell/phone within reach;with nursing/sitter in room;with chair alarm set Nurse Communication: Mobility status PT Visit Diagnosis: Unsteadiness on feet (R26.81);Muscle weakness (generalized) (M62.81);Pain Pain - Right/Left: Right Pain - part of body: Hip    Time: 0258-5277 PT Time Calculation (min) (ACUTE ONLY): 24 min   Charges:             Elna Breslow, SPT  Acute rehab   Cheyan Frees 08/19/2020, 6:01 PM

## 2020-08-20 ENCOUNTER — Encounter (HOSPITAL_COMMUNITY): Payer: Self-pay | Admitting: Orthopedic Surgery

## 2020-08-20 DIAGNOSIS — I2510 Atherosclerotic heart disease of native coronary artery without angina pectoris: Secondary | ICD-10-CM | POA: Diagnosis present

## 2020-08-20 DIAGNOSIS — Z7901 Long term (current) use of anticoagulants: Secondary | ICD-10-CM | POA: Diagnosis not present

## 2020-08-20 DIAGNOSIS — E785 Hyperlipidemia, unspecified: Secondary | ICD-10-CM | POA: Diagnosis present

## 2020-08-20 DIAGNOSIS — I110 Hypertensive heart disease with heart failure: Secondary | ICD-10-CM | POA: Diagnosis present

## 2020-08-20 DIAGNOSIS — Z96652 Presence of left artificial knee joint: Secondary | ICD-10-CM | POA: Diagnosis present

## 2020-08-20 DIAGNOSIS — M1611 Unilateral primary osteoarthritis, right hip: Secondary | ICD-10-CM | POA: Diagnosis present

## 2020-08-20 DIAGNOSIS — I5032 Chronic diastolic (congestive) heart failure: Secondary | ICD-10-CM | POA: Diagnosis present

## 2020-08-20 DIAGNOSIS — Z20822 Contact with and (suspected) exposure to covid-19: Secondary | ICD-10-CM | POA: Diagnosis present

## 2020-08-20 DIAGNOSIS — I872 Venous insufficiency (chronic) (peripheral): Secondary | ICD-10-CM | POA: Diagnosis present

## 2020-08-20 DIAGNOSIS — I480 Paroxysmal atrial fibrillation: Secondary | ICD-10-CM | POA: Diagnosis present

## 2020-08-20 DIAGNOSIS — Z79899 Other long term (current) drug therapy: Secondary | ICD-10-CM | POA: Diagnosis not present

## 2020-08-20 DIAGNOSIS — Z8249 Family history of ischemic heart disease and other diseases of the circulatory system: Secondary | ICD-10-CM | POA: Diagnosis not present

## 2020-08-20 DIAGNOSIS — M25551 Pain in right hip: Secondary | ICD-10-CM | POA: Diagnosis present

## 2020-08-20 LAB — CBC
HCT: 28 % — ABNORMAL LOW (ref 39.0–52.0)
Hemoglobin: 8.6 g/dL — ABNORMAL LOW (ref 13.0–17.0)
MCH: 24.2 pg — ABNORMAL LOW (ref 26.0–34.0)
MCHC: 30.7 g/dL (ref 30.0–36.0)
MCV: 78.9 fL — ABNORMAL LOW (ref 80.0–100.0)
Platelets: 209 10*3/uL (ref 150–400)
RBC: 3.55 MIL/uL — ABNORMAL LOW (ref 4.22–5.81)
RDW: 19.5 % — ABNORMAL HIGH (ref 11.5–15.5)
WBC: 8.8 10*3/uL (ref 4.0–10.5)
nRBC: 0 % (ref 0.0–0.2)

## 2020-08-20 LAB — BASIC METABOLIC PANEL
Anion gap: 9 (ref 5–15)
BUN: 16 mg/dL (ref 8–23)
CO2: 25 mmol/L (ref 22–32)
Calcium: 7.9 mg/dL — ABNORMAL LOW (ref 8.9–10.3)
Chloride: 103 mmol/L (ref 98–111)
Creatinine, Ser: 1.14 mg/dL (ref 0.61–1.24)
GFR, Estimated: >60 mL/min (ref >60)
Glucose, Bld: 115 mg/dL — ABNORMAL HIGH (ref 70–99)
Potassium: 3.8 mmol/L (ref 3.5–5.1)
Sodium: 137 mmol/L (ref 135–145)

## 2020-08-20 MED ORDER — HYDROCODONE-ACETAMINOPHEN 5-325 MG PO TABS: 1.0000 | ORAL_TABLET | Freq: Four times a day (QID) | ORAL | 0 refills | Status: DC | PRN

## 2020-08-20 MED ORDER — METHOCARBAMOL 500 MG PO TABS: 500.0000 mg | ORAL_TABLET | Freq: Four times a day (QID) | ORAL | 0 refills | Status: DC | PRN

## 2020-08-20 MED ORDER — TRAMADOL HCL 50 MG PO TABS: 50.0000 mg | ORAL_TABLET | Freq: Four times a day (QID) | ORAL | 0 refills | Status: DC | PRN

## 2020-08-20 NOTE — Plan of Care (Signed)
  Problem: Clinical Measurements: Goal: Respiratory complications will improve Outcome: Completed/Met

## 2020-08-20 NOTE — Discharge Instructions (Signed)
Frank Aluisio, MD Total Joint Specialist [facility] [address] [place], [place] [address] [phone]  ANTERIOR APPROACH TOTAL HIP REPLACEMENT POSTOPERATIVE DIRECTIONS     Hip Rehabilitation, Guidelines Following Surgery  The results of a hip operation are greatly improved after range of motion and muscle strengthening exercises. Follow all safety measures which are given to protect your hip. If any of these exercises cause increased pain or swelling in your joint, decrease the amount until you are comfortable again. Then slowly increase the exercises. Call your caregiver if you have problems or questions.   HOME CARE INSTRUCTIONS  . Remove items at home which could result in a fall. This includes throw rugs or furniture in walking pathways.   ICE to the affected hip as frequently as 20-30 minutes an hour and then as needed for pain and swelling. Continue to use ice on the hip for pain and swelling from surgery. You may notice swelling that will progress down to the foot and ankle. This is normal after surgery. Elevate the leg when you are not up walking on it.    Continue to use the breathing machine which will help keep your temperature down.  It is common for your temperature to cycle up and down following surgery, especially at night when you are not up moving around and exerting yourself.  The breathing machine keeps your lungs expanded and your temperature down.  DIET You may resume your previous home diet once your are discharged from the hospital.  DRESSING / WOUND CARE / SHOWERING . You have an adhesive waterproof bandage over the incision. Leave this in place until your first follow-up appointment. Once you remove this you will not need to place another bandage.  . You may begin showering 3 days following surgery, but do not submerge the incision under water.  ACTIVITY . For the first 3-5 days, it is important to rest and keep the operative  leg elevated. You should, as a general rule, rest for 50 minutes and walk/stretch for 10 minutes per hour. After 5 days, you may slowly increase activity as tolerated.  . Perform the exercises you were provided twice a day for about 15-20 minutes each session. Begin these 2 days following surgery. . Walk with your walker as instructed. Use the walker until you are comfortable transitioning to a cane. Walk with the cane in the opposite hand of the operative leg. You may discontinue the cane once you are comfortable and walking steadily. . Avoid periods of inactivity such as sitting longer than an hour when not asleep. This helps prevent blood clots.  . Do not drive a car for 6 weeks or until released by your surgeon.  . Do not drive while taking narcotics.  TED HOSE STOCKINGS Wear the elastic stockings on both legs for three weeks following surgery during the day. You may remove them at night while sleeping.  WEIGHT BEARING Weight bearing as tolerated with assist device (walker, cane, etc) as directed, use it as long as suggested by your surgeon or therapist, typically at least 4-6 weeks.  POSTOPERATIVE CONSTIPATION PROTOCOL Constipation - defined medically as fewer than three stools per week and severe constipation as less than one stool per week.  One of the most common issues patients have following surgery is constipation.  Even if you have a regular bowel pattern at home, your normal regimen is likely to be disrupted due to multiple reasons following surgery.  Combination of anesthesia, postoperative narcotics,   change in appetite and fluid intake all can affect your bowels.  In order to avoid complications following surgery, here are some recommendations in order to help you during your recovery period.  . Colace (docusate) - Pick up an over-the-counter form of Colace or another stool softener and take twice a day as long as you are requiring postoperative pain medications.  Take with a full  glass of water daily.  If you experience loose stools or diarrhea, hold the colace until you stool forms back up.  If your symptoms do not get better within 1 week or if they get worse, check with your doctor. . Dulcolax (bisacodyl) - Pick up over-the-counter and take as directed by the product packaging as needed to assist with the movement of your bowels.  Take with a full glass of water.  Use this product as needed if not relieved by Colace only.  . MiraLax (polyethylene glycol) - Pick up over-the-counter to have on hand.  MiraLax is a solution that will increase the amount of water in your bowels to assist with bowel movements.  Take as directed and can mix with a glass of water, juice, soda, coffee, or tea.  Take if you go more than two days without a movement.Do not use MiraLax more than once per day. Call your doctor if you are still constipated or irregular after using this medication for 7 days in a row.  If you continue to have problems with postoperative constipation, please contact the office for further assistance and recommendations.  If you experience "the worst abdominal pain ever" or develop nausea or vomiting, please contact the office immediatly for further recommendations for treatment.  ITCHING  If you experience itching with your medications, try taking only a single pain pill, or even half a pain pill at a time.  You can also use Benadryl over the counter for itching or also to help with sleep.   MEDICATIONS See your medication summary on the "After Visit Summary" that the nursing staff will review with you prior to discharge.  You may have some home medications which will be placed on hold until you complete the course of blood thinner medication.  It is important for you to complete the blood thinner medication as prescribed by your surgeon.  Continue your approved medications as instructed at time of discharge.  PRECAUTIONS If you experience chest pain or shortness of breath -  call 911 immediately for transfer to the hospital emergency department.  If you develop a fever greater that 101 F, purulent drainage from wound, increased redness or drainage from wound, foul odor from the wound/dressing, or calf pain - CONTACT YOUR SURGEON.                                                   FOLLOW-UP APPOINTMENTS Make sure you keep all of your appointments after your operation with your surgeon and caregivers. You should call the office at the above phone number and make an appointment for approximately two weeks after the date of your surgery or on the date instructed by your surgeon outlined in the "After Visit Summary".  RANGE OF MOTION AND STRENGTHENING EXERCISES  These exercises are designed to help you keep full movement of your hip joint. Follow your caregiver's or physical therapist's instructions. Perform all exercises about fifteen times, three   times per day or as directed. Exercise both hips, even if you have had only one joint replacement. These exercises can be done on a training (exercise) mat, on the floor, on a table or on a bed. Use whatever works the best and is most comfortable for you. Use music or television while you are exercising so that the exercises are a pleasant break in your day. This will make your life better with the exercises acting as a break in routine you can look forward to.  . Lying on your back, slowly slide your foot toward your buttocks, raising your knee up off the floor. Then slowly slide your foot back down until your leg is straight again.  . Lying on your back spread your legs as far apart as you can without causing discomfort.  . Lying on your side, raise your upper leg and foot straight up from the floor as far as is comfortable. Slowly lower the leg and repeat.  . Lying on your back, tighten up the muscle in the front of your thigh (quadriceps muscles). You can do this by keeping your leg straight and trying to raise your heel off the  floor. This helps strengthen the largest muscle supporting your knee.  . Lying on your back, tighten up the muscles of your buttocks both with the legs straight and with the knee bent at a comfortable angle while keeping your heel on the floor.   IF YOU ARE TRANSFERRED TO A SKILLED REHAB FACILITY If the patient is transferred to a skilled rehab facility following release from the hospital, a list of the current medications will be sent to the facility for the patient to continue.  When discharged from the skilled rehab facility, please have the facility set up the patient's Home Health Physical Therapy prior to being released. Also, the skilled facility will be responsible for providing the patient with their medications at time of release from the facility to include their pain medication, the muscle relaxants, and their blood thinner medication. If the patient is still at the rehab facility at time of the two week follow up appointment, the skilled rehab facility will also need to assist the patient in arranging follow up appointment in our office and any transportation needs.  MAKE SURE YOU:  . Understand these instructions.  . Get help right away if you are not doing well or get worse.    DENTAL ANTIBIOTICS:  In most cases prophylactic antibiotics for Dental procdeures after total joint surgery are not necessary.  Exceptions are as follows:  1. History of prior total joint infection  2. Severely immunocompromised (Organ Transplant, cancer chemotherapy, Rheumatoid biologic meds such as Humera)  3. Poorly controlled diabetes (A1C &gt; 8.0, blood glucose over 200)  If you have one of these conditions, contact your surgeon for an antibiotic prescription, prior to your dental procedure.    Pick up stool softner and laxative for home use following surgery while on pain medications. Do not submerge incision under water. Please use good hand washing techniques while changing dressing each  day. May shower starting three days after surgery. Please use a clean towel to pat the incision dry following showers. Continue to use ice for pain and swelling after surgery. Do not use any lotions or creams on the incision until instructed by your surgeon.  

## 2020-08-20 NOTE — Progress Notes (Signed)
Physical Therapy Treatment Patient Details Name: [name] MRN: [id number] DOB: 14-Oct-1940 Today's Date: 08/20/2020    History of Present Illness patient is an 80 yo male s/p Rt THA anterior approach on 08/19/2020 with PMH significant for v-tach, pneumothroax, CAD, venous insufficiency, CHF, HTN, Paroxysmal a-fib, Lt TKA (2019) and Lt THA.    PT Comments    Pt ambulated in hallway and tolerated improved distance this afternoon.  Pt also performed LE exercises.  Daughter, [name], present for session and reports family with be in/out however a family friend is staying to supervise pt upon d/c (not able to assist with care or mobility), so pt will need to be fairly independent upon d/c home.    Follow Up Recommendations  Follow surgeon's recommendation for DC plan and follow-up therapies;Home health PT     Equipment Recommendations  None recommended by PT    Recommendations for Other Services       Precautions / Restrictions Precautions Precautions: Fall Restrictions Other Position/Activity Restrictions: WBAT    Mobility  Bed Mobility Overal bed mobility: Needs Assistance Bed Mobility: Supine to Sit     Supine to sit: Min assist;HOB elevated     General bed mobility comments: in recliner  Transfers Overall transfer level: Needs assistance Equipment used: Rolling walker (2 wheeled) Transfers: Sit to/from Stand Sit to Stand: Min assist         General transfer comment: verbal cues for UE and LE positioning, assist to rise and steady  Ambulation/Gait Ambulation/Gait assistance: Min guard Gait Distance (Feet): 160 Feet Assistive device: Rolling walker (2 wheeled) Gait Pattern/deviations: Step-to pattern;Decreased stride length;Decreased weight shift to right;Antalgic Gait velocity: decr   General Gait Details: verbal cues for sequence, step length, UEs through RW for pain control, tolerated improved distance this afternoon   Stairs              Wheelchair Mobility    Modified Rankin (Stroke Patients Only)       Balance                                            Cognition Arousal/Alertness: Awake/alert Behavior During Therapy: WFL for tasks assessed/performed Overall Cognitive Status: Within Functional Limits for tasks assessed                                        Exercises Total Joint Exercises Ankle Circles/Pumps: AROM;Both;10 reps Hip ABduction/ADduction: AROM;Right;10 reps;Standing Long Arc Quad: AROM;Right;Seated;10 reps Knee Flexion: AROM;Right;10 reps;Standing Marching in Standing: AROM;Right;10 reps;Standing    General Comments        Pertinent Vitals/Pain Pain Assessment: 0-10 Pain Score: 5  Pain Location: Rt thigh Pain Descriptors / Indicators: Discomfort;Sore;Tender Pain Intervention(s): Repositioned;Monitored during session    Home Living                      Prior Function            PT Goals (current goals can now be found in the care plan section) Progress towards PT goals: Progressing toward goals    Frequency    7X/week      PT Plan Current plan remains appropriate    Co-evaluation              AM-PAC PT "  6 Clicks" Mobility   Outcome Measure  Help needed turning from your back to your side while in a flat bed without using bedrails?: A Little Help needed moving from lying on your back to sitting on the side of a flat bed without using bedrails?: A Little Help needed moving to and from a bed to a chair (including a wheelchair)?: A Little Help needed standing up from a chair using your arms (e.g., wheelchair or bedside chair)?: A Little Help needed to walk in hospital room?: A Little Help needed climbing 3-5 steps with a railing? : A Lot 6 Click Score: 17    End of Session Equipment Utilized During Treatment: Gait belt Activity Tolerance: Patient tolerated treatment well Patient left: in chair;with call bell/phone  within reach;with chair alarm set;with family/visitor present Nurse Communication: Mobility status PT Visit Diagnosis: Muscle weakness (generalized) (M62.81);Difficulty in walking, not elsewhere classified (R26.2)     Time: 1027-2536 PT Time Calculation (min) (ACUTE ONLY): 24 min  Charges:  $Gait Training: 8-22 mins $Therapeutic Exercise: 8-22 mins                     Jannette Spanner PT, DPT Acute Rehabilitation Services Pager: [phone] Office: [phone]  York Ram E 08/20/2020, 4:14 PM

## 2020-08-20 NOTE — Progress Notes (Signed)
Subjective: 1 Day Post-Op Procedure(s) (LRB): TOTAL HIP ARTHROPLASTY ANTERIOR APPROACH (Right) Patient reports pain as mild.   Patient seen in rounds by Dr. Wynelle Link. Patient is well, and has had no acute complaints or problems other than soreness in the right thigh. No issues overnight, denies chest pain or SOB. Foley catheter to be removed this AM.  We will continue therapy today, ambulated 25' yesterday.   Objective: Vital signs in last 24 hours: Temp:  [97.4 F (36.3 C)-100 F (37.8 C)] 99.5 F (37.5 C) (02/01 0410) Pulse Rate:  [42-67] 67 (02/01 0410) Resp:  [10-18] 18 (02/01 0410) BP: (114-160)/(64-101) 127/81 (02/01 0410) SpO2:  [94 %-100 %] 95 % (02/01 0410) Weight:  [65 kg] 65 kg (01/31 0830)  Intake/Output from previous day:  Intake/Output Summary (Last 24 hours) at 08/20/2020 0710 Last data filed at 08/20/2020 0645 Gross per 24 hour  Intake 3090.32 ml  Output 1800 ml  Net 1290.32 ml     Intake/Output this shift: No intake/output data recorded.  Labs: Recent Labs    08/19/20 0910 08/20/20 0543  HGB 10.3* 8.6*   Recent Labs    08/19/20 0910 08/20/20 0543  WBC 7.0 8.8  RBC 4.34 3.55*  HCT 34.7* 28.0*  PLT 194 209   Recent Labs    08/19/20 0910 08/20/20 0543  NA 140 137  K 4.1 3.8  CL 103 103  CO2 26 25  BUN 21 16  CREATININE 0.90 1.14  GLUCOSE 99 115*  CALCIUM 9.4 7.9*   Recent Labs    08/19/20 0910 08/19/20 1040  INR SPECIMEN CLOTTED 1.1    Exam: General - Patient is Alert and Oriented Extremity - Neurologically intact Neurovascular intact Sensation intact distally Dorsiflexion/Plantar flexion intact Dressing - dressing C/D/I Motor Function - intact, moving foot and toes well on exam.   Past Medical History:  Diagnosis Date  . Ascending aorta dilatation (HCC)    13mm by chest CT angio 04/2018  . CAD (coronary artery disease)    a. 05/2006 Abnl stress test w/ 2-53mm ST dep; b. 05/2006 Cath/PCI: LM nl, LAD 30-40ost, 20-30p, 90d  (small), D1 nl, D2 nl, LCX nl, OM1/2 nl, RCA Ca2+, 3m/d (3.5x16 Liberty BMS & 3.5x12 Liberty BMS), EF 50%.  . Chronic diastolic CHF (congestive heart failure) ([place])    a. 10/2017 Echo: EF 50-55%, mild AI, sev dil LA, mod dil RA.  Marland Kitchen Chronic venous insufficiency   . HTN (hypertension)   . PAF (paroxysmal atrial fibrillation) ([place])    a. CHA2DS2VASc 5-->Eliquis; b. Prev WCT on flecainide;  c. 12/2017 s/p DCCV.  Marland Kitchen Pneumothorax   . Ventricular tachycardia (HCC)    a. possibly proarrhythmia from flecainide.    Assessment/Plan: 1 Day Post-Op Procedure(s) (LRB): TOTAL HIP ARTHROPLASTY ANTERIOR APPROACH (Right) Principal Problem:   OA (osteoarthritis) of hip Active Problems:   Osteoarthritis of right hip  Estimated body mass index is 17.92 kg/m as calculated from the following:   Height as of this encounter: 6\' 3"  (1.905 m).   Weight as of this encounter: 65 kg. Advance diet Up with therapy D/C IV fluids  DVT Prophylaxis - Eliquis Weight bearing as tolerated. Continue therapy.  Plan is to go Home after hospital stay. Possible discharge today with HEP if progresses with therapy and meeting his goals. Follow-up in the office February 15th.  The PDMP database was reviewed today prior to any opioid medications being prescribed to this patient.  Theresa Duty, PA-C Orthopedic Surgery [phone] 08/20/2020, 7:10  AM

## 2020-08-20 NOTE — Progress Notes (Signed)
Physical Therapy Treatment Patient Details Name: [name] MRN: [id number] DOB: 1940/08/10 Today's Date: 08/20/2020    History of Present Illness patient is an 80 yo male s/p Rt THA anterior approach on 08/19/2020 with PMH significant for v-tach, pneumothroax, CAD, venous insufficiency, CHF, HTN, Paroxysmal a-fib, Lt TKA (2019) and Lt THA.    PT Comments    Pt assisted with ambulating however only tolerated short distance in hallway. Pt currently requiring min assist for mobility and does not appear ready to d/c home alone.   Follow Up Recommendations  Follow surgeon's recommendation for DC plan and follow-up therapies;Home health PT     Equipment Recommendations  None recommended by PT    Recommendations for Other Services       Precautions / Restrictions Precautions Precautions: Fall Restrictions Other Position/Activity Restrictions: WBAT    Mobility  Bed Mobility Overal bed mobility: Needs Assistance Bed Mobility: Supine to Sit     Supine to sit: Min assist;HOB elevated     General bed mobility comments: assist for Right LE due to pain  Transfers Overall transfer level: Needs assistance Equipment used: Rolling walker (2 wheeled) Transfers: Sit to/from Stand Sit to Stand: From elevated surface;Min assist         General transfer comment: verbal cues for UE and LE positioning, assist to rise and steady  Ambulation/Gait Ambulation/Gait assistance: Min guard Gait Distance (Feet): 25 Feet Assistive device: Rolling walker (2 wheeled) Gait Pattern/deviations: Step-to pattern;Decreased stride length;Decreased weight shift to right;Antalgic Gait velocity: decr   General Gait Details: verbal cues for sequence, UEs through RW for pain control, distance limited by pain   Stairs             Wheelchair Mobility    Modified Rankin (Stroke Patients Only)       Balance                                            Cognition  Arousal/Alertness: Awake/alert Behavior During Therapy: WFL for tasks assessed/performed Overall Cognitive Status: Within Functional Limits for tasks assessed                                        Exercises      General Comments        Pertinent Vitals/Pain Pain Assessment: 0-10 Pain Score: 6  Pain Location: Rt thigh Pain Descriptors / Indicators: Discomfort;Sore;Tender Pain Intervention(s): Repositioned;Premedicated before session;Monitored during session    Home Living                      Prior Function            PT Goals (current goals can now be found in the care plan section) Progress towards PT goals: Progressing toward goals    Frequency    7X/week      PT Plan Current plan remains appropriate    Co-evaluation              AM-PAC PT "6 Clicks" Mobility   Outcome Measure  Help needed turning from your back to your side while in a flat bed without using bedrails?: A Little Help needed moving from lying on your back to sitting on the side of a flat bed without using bedrails?: A Little  Help needed moving to and from a bed to a chair (including a wheelchair)?: A Little Help needed standing up from a chair using your arms (e.g., wheelchair or bedside chair)?: A Little Help needed to walk in hospital room?: A Little Help needed climbing 3-5 steps with a railing? : A Lot 6 Click Score: 17    End of Session Equipment Utilized During Treatment: Gait belt Activity Tolerance: Patient limited by pain Patient left: in chair;with call bell/phone within reach;with nursing/sitter in room;with chair alarm set Nurse Communication: Mobility status PT Visit Diagnosis: Muscle weakness (generalized) (M62.81);Difficulty in walking, not elsewhere classified (R26.2)     Time: 7622-6333 PT Time Calculation (min) (ACUTE ONLY): 22 min  Charges:  $Gait Training: 8-22 mins                     Jannette Spanner PT, DPT Acute Rehabilitation  Services Pager: [phone] Office: [phone]  York Ram E 08/20/2020, 1:26 PM

## 2020-08-21 LAB — BASIC METABOLIC PANEL
Anion gap: 8 (ref 5–15)
BUN: 18 mg/dL (ref 8–23)
CO2: 25 mmol/L (ref 22–32)
Calcium: 7.9 mg/dL — ABNORMAL LOW (ref 8.9–10.3)
Chloride: 105 mmol/L (ref 98–111)
Creatinine, Ser: 1.01 mg/dL (ref 0.61–1.24)
GFR, Estimated: >60 mL/min (ref >60)
Glucose, Bld: 103 mg/dL — ABNORMAL HIGH (ref 70–99)
Potassium: 3.5 mmol/L (ref 3.5–5.1)
Sodium: 138 mmol/L (ref 135–145)

## 2020-08-21 LAB — CBC
HCT: 28.9 % — ABNORMAL LOW (ref 39.0–52.0)
Hemoglobin: 8.7 g/dL — ABNORMAL LOW (ref 13.0–17.0)
MCH: 24.1 pg — ABNORMAL LOW (ref 26.0–34.0)
MCHC: 30.1 g/dL (ref 30.0–36.0)
MCV: 80.1 fL (ref 80.0–100.0)
Platelets: 222 10*3/uL (ref 150–400)
RBC: 3.61 MIL/uL — ABNORMAL LOW (ref 4.22–5.81)
RDW: 19.8 % — ABNORMAL HIGH (ref 11.5–15.5)
WBC: 10.9 10*3/uL — ABNORMAL HIGH (ref 4.0–10.5)
nRBC: 0 % (ref 0.0–0.2)

## 2020-08-21 NOTE — Progress Notes (Signed)
Physical Therapy Treatment Patient Details Name: [name] MRN: [id number] DOB: Mar 18, 1941 Today's Date: 08/21/2020    History of Present Illness Patient is an 80 yo male s/p Rt THA anterior approach on 08/19/2020 with PMH significant for v-tach, pneumothroax, CAD, venous insufficiency, CHF, HTN, Paroxysmal a-fib, Lt TKA (2019) and Lt THA.    PT Comments    Patient progressing well and ambulated ~300' with min assist for safe walker management. Patient requires verbal cues for safety throughout transfers and gait. He was instructed in seated HEP for ROM, strengthening, and circulation. Patient's daughter present and reports the home aid they had arranged has fallen through and pt does not currently have anyone to stay with him at home. They are working now with a private duty agency to provide home care and are hoping for them to come out tomorrow. Patient is not safe to discharge home today as he will need supervision/assistance for all mobility 2/2 ongoing weakness and decreased safety awareness. Acute PT will continue to progress pt as able.    Follow Up Recommendations  Home health PT;Supervision for mobility/OOB (pt will GREATLY Benefit from HHPT)     Equipment Recommendations  None recommended by PT    Recommendations for Other Services       Precautions / Restrictions Precautions Precautions: Fall Restrictions Weight Bearing Restrictions: No RLE Weight Bearing: Weight bearing as tolerated Other Position/Activity Restrictions: WBAT    Mobility  Bed Mobility               General bed mobility comments: OOB in recliner  Transfers Overall transfer level: Needs assistance Equipment used: Rolling walker (2 wheeled) Transfers: Sit to/from Stand Sit to Stand: Min guard         General transfer comment: cues for hand placement for power up and safety as pt scooting very far forward to front of recliner. cues to kick Rt LE forward to prevent pain when  sitting.  Ambulation/Gait Ambulation/Gait assistance: Min assist Gait Distance (Feet): 160 Feet Assistive device: Rolling walker (2 wheeled) Gait Pattern/deviations: Step-to pattern;Decreased stride length;Decreased weight shift to right;Narrow base of support Gait velocity: decr   General Gait Details: MIN guard for safety with verbal/tactile cues for RW management with step to gait pattern. with WB through UEs when on Rt LE for pain control. cues for safety to keep bil hands on RW during gait as pt paused several times and reached for Rt hip while flexing knee and hip to demonstrate what makes it sore. educated on safety concerns.   Stairs             Wheelchair Mobility    Modified Rankin (Stroke Patients Only)       Balance Overall balance assessment: Needs assistance Sitting-balance support: Feet supported Sitting balance-Leahy Scale: Fair     Standing balance support: Bilateral upper extremity supported Standing balance-Leahy Scale: Poor Standing balance comment: reliant on external support for static and dynamic standing.                            Cognition Arousal/Alertness: Awake/alert Behavior During Therapy: WFL for tasks assessed/performed Overall Cognitive Status: Within Functional Limits for tasks assessed                                        Exercises Total Joint Exercises Ankle Circles/Pumps: AROM;Both;10 reps [employer]  Sets: AROM;Right;10 reps;Seated Heel Slides: AROM;Right;10 reps;Seated Hip ABduction/ADduction: AROM;Right;10 reps;Seated    General Comments        Pertinent Vitals/Pain Pain Assessment: Faces Faces Pain Scale: Hurts little more Pain Location: Rt thigh Pain Descriptors / Indicators: Discomfort;Sore;Tender;Grimacing Pain Intervention(s): Limited activity within patient's tolerance;Monitored during session;Repositioned    Home Living                      Prior Function            PT  Goals (current goals can now be found in the care plan section) Acute Rehab PT Goals Patient Stated Goal: want to return home but have someone there for supervision and improve LE strength. PT Goal Formulation: With patient Time For Goal Achievement: 08/26/20 Potential to Achieve Goals: Good Progress towards PT goals: Progressing toward goals    Frequency    7X/week      PT Plan Current plan remains appropriate    Co-evaluation              AM-PAC PT "6 Clicks" Mobility   Outcome Measure  Help needed turning from your back to your side while in a flat bed without using bedrails?: A Little Help needed moving from lying on your back to sitting on the side of a flat bed without using bedrails?: A Little Help needed moving to and from a bed to a chair (including a wheelchair)?: A Little Help needed standing up from a chair using your arms (e.g., wheelchair or bedside chair)?: A Little Help needed to walk in hospital room?: A Little Help needed climbing 3-5 steps with a railing? : A Lot 6 Click Score: 17    End of Session Equipment Utilized During Treatment: Gait belt Activity Tolerance: Patient tolerated treatment well Patient left: in chair;with call bell/phone within reach;with chair alarm set;with family/visitor present Nurse Communication: Mobility status PT Visit Diagnosis: Muscle weakness (generalized) (M62.81);Difficulty in walking, not elsewhere classified (R26.2) Pain - Right/Left: Right Pain - part of body: Hip     Time: 7622-6333 PT Time Calculation (min) (ACUTE ONLY): 43 min  Charges:  $Gait Training: 23-37 mins $Therapeutic Exercise: 8-22 mins                     Verner Mould, DPT Acute Rehabilitation Services Office [phone] Pager [phone]     Jacques Navy 08/21/2020, 3:44 PM

## 2020-08-21 NOTE — TOC Transition Note (Signed)
Transition of Care [facility]) - CM/SW Discharge Note   Patient Details  Name: [name] MRN: [id number] Date of Birth: 05/06/1941  [facility] CM/SW Contact:  Ross Ludwig, LCSW Phone Number: 08/21/2020, 3:13 PM   Clinical Narrative:     CSW received consult that patient may need home health.  CSW spoke to patient's daughter to discuss home health services.  Patient's daughter was interested in home health.  Patient's daughter was also interested in private care agencies.  CSW emailed her a list of agencies, and informed her that they would have to pay out of pocket.  CSW asked if patient will need any equipment and she stated he already has a walker so they do not need any other equipment.  CSW contacted ortho physician assistant and she stated patient does not need home health.  CSW will not be setting up home health services per PA recommendations.  CSW signing off please reconsult with social work needs.   Final next level of care: Home/Self Care Barriers to Discharge: Barriers Resolved   Patient Goals and CMS Choice Patient states their goals for this hospitalization and ongoing recovery are:: To return back home. CMS Medicare.gov Compare Post Acute Care list provided to:: Patient Represenative (must comment) Choice offered to / list presented to : Adult Children  Discharge Placement                       Discharge Plan and Services                DME Arranged: N/A                    Social Determinants of Health (SDOH) Interventions     Readmission Risk Interventions Readmission Risk Prevention Plan 03/03/2018  Post Dischage Appt Complete  Medication Screening Complete  Transportation Screening Complete  PCP follow-up Complete  Some recent data might be hidden

## 2020-08-21 NOTE — Progress Notes (Signed)
   Subjective: 2 Days Post-Op Procedure(s) (LRB): TOTAL HIP ARTHROPLASTY ANTERIOR APPROACH (Right) Patient reports pain as mild.   Patient seen in rounds by Dr. Wynelle Link. Patient is well, and has had no acute complaints or problems other than pain in the right hip. Did well with physical therapy yesterday. Denies chest pain or SOB. Voiding without difficulty. Plan is to go Home after hospital stay.  Objective: Vital signs in last 24 hours: Temp:  [98.2 F (36.8 C)-98.5 F (36.9 C)] 98.2 F (36.8 C) (02/02 0505) Pulse Rate:  [60-63] 62 (02/02 0505) Resp:  [18] 18 (02/02 0505) BP: (112-122)/(60-69) 112/60 (02/02 0505) SpO2:  [94 %-96 %] 96 % (02/02 0505)  Intake/Output from previous day:  Intake/Output Summary (Last 24 hours) at 08/21/2020 0758 Last data filed at 08/20/2020 1200 Gross per 24 hour  Intake 393.65 ml  Output --  Net 393.65 ml    Intake/Output this shift: No intake/output data recorded.  Labs: Recent Labs    08/19/20 0910 08/20/20 0543 08/21/20 0553  HGB 10.3* 8.6* 8.7*   Recent Labs    08/20/20 0543 08/21/20 0553  WBC 8.8 10.9*  RBC 3.55* 3.61*  HCT 28.0* 28.9*  PLT 209 222   Recent Labs    08/20/20 0543 08/21/20 0553  NA 137 138  K 3.8 3.5  CL 103 105  CO2 25 25  BUN 16 18  CREATININE 1.14 1.01  GLUCOSE 115* 103*  CALCIUM 7.9* 7.9*   Recent Labs    08/19/20 0910 08/19/20 1040  INR SPECIMEN CLOTTED 1.1    Exam: General - Patient is Alert and Oriented Extremity - Neurologically intact Neurovascular intact Sensation intact distally Dorsiflexion/Plantar flexion intact Dressing/Incision - clean, dry, no drainage Motor Function - intact, moving foot and toes well on exam.   Past Medical History:  Diagnosis Date  . Ascending aorta dilatation (HCC)    47mm by chest CT angio 04/2018  . CAD (coronary artery disease)    a. 05/2006 Abnl stress test w/ 2-47mm ST dep; b. 05/2006 Cath/PCI: LM nl, LAD 30-40ost, 20-30p, 90d (small), D1 nl, D2  nl, LCX nl, OM1/2 nl, RCA Ca2+, 59m/d (3.5x16 Liberty BMS & 3.5x12 Liberty BMS), EF 50%.  . Chronic diastolic CHF (congestive heart failure) ([place])    a. 10/2017 Echo: EF 50-55%, mild AI, sev dil LA, mod dil RA.  Marland Kitchen Chronic venous insufficiency   . HTN (hypertension)   . PAF (paroxysmal atrial fibrillation) ([place])    a. CHA2DS2VASc 5-->Eliquis; b. Prev WCT on flecainide;  c. 12/2017 s/p DCCV.  Marland Kitchen Pneumothorax   . Ventricular tachycardia (HCC)    a. possibly proarrhythmia from flecainide.    Assessment/Plan: 2 Days Post-Op Procedure(s) (LRB): TOTAL HIP ARTHROPLASTY ANTERIOR APPROACH (Right) Principal Problem:   OA (osteoarthritis) of hip Active Problems:   Osteoarthritis of right hip  Estimated body mass index is 17.92 kg/m as calculated from the following:   Height as of this encounter: 6\' 3"  (1.905 m).   Weight as of this encounter: 65 kg. Advance diet Up with therapy D/C IV fluids  DVT Prophylaxis - Eliquis Weight-bearing as tolerated  Plan for discharge if meeting goals with physical therapy.  Theresa Duty, PA-C Orthopedic Surgery [phone] 08/21/2020, 7:58 AM

## 2020-08-21 NOTE — Progress Notes (Signed)
Patient having issues with confusion and "fogginess." Discontinuing hydrocodone at this time. He is still having pain, unfortunately the risks of continuing the pain medication outweigh the benefit of relieving pain. Can continue tramadol and tylenol at this time. Due to issues arranging help at home, he will stay until tomorrow in order to arrange adequate care and improve mobility.  Theresa Duty, PA-C Orthopedic Surgery [facility] [place]

## 2020-08-21 NOTE — Progress Notes (Addendum)
Physical Therapy Treatment Patient Details Name: [name] MRN: [id number] DOB: 1941-01-01 Today's Date: 08/21/2020    History of Present Illness Patient is an 80 yo male s/p Rt THA anterior approach on 08/19/2020 with PMH significant for v-tach, pneumothroax, CAD, venous insufficiency, CHF, HTN, Paroxysmal a-fib, Lt TKA (2019) and Lt THA.    PT Comments     Pt progressing toward acute PT goals performing stair negotiation with MIN assist with +2 for safety/ stability with cues for sequencing. Pt ambulated with MIN guard and cues for safe proximity to RW and step to gait pattern. PT reviewed seated therapeutic exercises with pt, pt tolerated well. Pt's daughter reports that family is currently working on making arrangements through a private home agency to provide support for pt at home. Pt is currently not at a safe mobility level for return home alone. Pt will require assist at home for safety with mobility 2/2 increased pain levels, muscle weakness, and decreased safety awareness. Acute therapy to follow up during stay.       Follow Up Recommendations  Home health PT;Supervision for mobility/OOB (pt will GREATLY Benefit from HHPT)     Equipment Recommendations  None recommended by PT    Recommendations for Other Services       Precautions / Restrictions Precautions Precautions: Fall Restrictions Weight Bearing Restrictions: No RLE Weight Bearing: Weight bearing as tolerated Other Position/Activity Restrictions: WBAT    Mobility  Bed Mobility               General bed mobility comments: OOB in recliner  Transfers Overall transfer level: Needs assistance Equipment used: Rolling walker (2 wheeled) Transfers: Sit to/from Stand Sit to Stand: Min assist         General transfer comment: MIN assist for power up to stand with cues for safe hand placement and cues to kick Rt LE forward to prevent pain when sitting.  Ambulation/Gait Ambulation/Gait assistance:  Min assist Gait Distance (Feet): 30 Feet Assistive device: Rolling walker (2 wheeled) Gait Pattern/deviations: Step-to pattern;Decreased stride length;Decreased weight shift to right;Narrow base of support Gait velocity: decr   General Gait Details: MIN assist for safety with cues for step to gait pattern, to keep hands on RW for stability, and for safe proximity to RW.   Stairs Stairs: Yes Stairs assistance: Min assist;+2 safety/equipment Stair Management: No rails;Forwards;With walker Number of Stairs: 3 General stair comments: Pt unsteady on steps requiring MIN assist with +2 for RW management and safety/stability with cues for "up with the good, down with the bad."   Wheelchair Mobility    Modified Rankin (Stroke Patients Only)       Balance Overall balance assessment: Needs assistance Sitting-balance support: Feet supported Sitting balance-Leahy Scale: Fair     Standing balance support: Bilateral upper extremity supported Standing balance-Leahy Scale: Poor Standing balance comment: reliant on external support for static and dynamic standing.                            Cognition Arousal/Alertness: Awake/alert Behavior During Therapy: WFL for tasks assessed/performed Overall Cognitive Status: Within Functional Limits for tasks assessed                                        Exercises Total Joint Exercises Ankle Circles/Pumps: AROM;Both;10 reps;Seated Quad Sets: AROM;Right;Seated;5 reps Short Arc Quad: AROM;Right;5 reps;Seated Hip  ABduction/ADduction: AROM;Right;Seated;5 reps    General Comments        Pertinent Vitals/Pain Pain Assessment: Faces Faces Pain Scale: Hurts little more Pain Location: Rt thigh Pain Descriptors / Indicators: Discomfort;Sore;Tender;Grimacing Pain Intervention(s): Limited activity within patient's tolerance;Monitored during session;Repositioned;Ice applied    Home Living                       Prior Function            PT Goals (current goals can now be found in the care plan section) Acute Rehab PT Goals Patient Stated Goal: want to return home but have someone there for supervision and improve LE strength. PT Goal Formulation: With patient Time For Goal Achievement: 08/26/20 Potential to Achieve Goals: Good Progress towards PT goals: Progressing toward goals    Frequency    7X/week      PT Plan Current plan remains appropriate    Co-evaluation              AM-PAC PT "6 Clicks" Mobility   Outcome Measure  Help needed turning from your back to your side while in a flat bed without using bedrails?: A Little Help needed moving from lying on your back to sitting on the side of a flat bed without using bedrails?: A Little Help needed moving to and from a bed to a chair (including a wheelchair)?: A Little Help needed standing up from a chair using your arms (e.g., wheelchair or bedside chair)?: A Little Help needed to walk in hospital room?: A Little Help needed climbing 3-5 steps with a railing? : A Lot 6 Click Score: 17    End of Session Equipment Utilized During Treatment: Gait belt Activity Tolerance: Patient tolerated treatment well Patient left: in chair;with call bell/phone within reach;with chair alarm set;with family/visitor present Nurse Communication: Mobility status PT Visit Diagnosis: Muscle weakness (generalized) (M62.81);Difficulty in walking, not elsewhere classified (R26.2) Pain - Right/Left: Right Pain - part of body: Hip     Time: 4098-1191 PT Time Calculation (min) (ACUTE ONLY): 22 min  Charges:                        Elna Breslow, SPT  Acute rehab     Elna Breslow 08/21/2020, 6:44 PM

## 2020-08-22 LAB — CBC
HCT: 28.4 % — ABNORMAL LOW (ref 39.0–52.0)
Hemoglobin: 8.6 g/dL — ABNORMAL LOW (ref 13.0–17.0)
MCH: 23.8 pg — ABNORMAL LOW (ref 26.0–34.0)
MCHC: 30.3 g/dL (ref 30.0–36.0)
MCV: 78.5 fL — ABNORMAL LOW (ref 80.0–100.0)
Platelets: 223 10*3/uL (ref 150–400)
RBC: 3.62 MIL/uL — ABNORMAL LOW (ref 4.22–5.81)
RDW: 19.7 % — ABNORMAL HIGH (ref 11.5–15.5)
WBC: 9.3 10*3/uL (ref 4.0–10.5)
nRBC: 0 % (ref 0.0–0.2)

## 2020-08-22 NOTE — Progress Notes (Signed)
   Subjective: 3 Days Post-Op Procedure(s) (LRB): TOTAL HIP ARTHROPLASTY ANTERIOR APPROACH (Right) Patient reports pain as mild.   Patient seen in rounds with Dr. Wynelle Link. Patient is feeling well other than some soreness in the right thigh from lying in bed all night. Denies chest pain or SOB. No issues overnight.  Plan is to go Home after hospital stay.  Objective: Vital signs in last 24 hours: Temp:  [98.2 F (36.8 C)-98.8 F (37.1 C)] 98.2 F (36.8 C) (02/03 0555) Pulse Rate:  [55-62] 62 (02/03 0555) Resp:  [18] 18 (02/03 0555) BP: (108-126)/(60-66) 120/60 (02/03 0555) SpO2:  [97 %-98 %] 97 % (02/03 0555)  Intake/Output from previous day:  Intake/Output Summary (Last 24 hours) at 08/22/2020 2130 Last data filed at 08/22/2020 0400 Gross per 24 hour  Intake -  Output 1450 ml  Net -1450 ml    Intake/Output this shift: No intake/output data recorded.  Labs: Recent Labs    08/19/20 0910 08/20/20 0543 08/21/20 0553 08/22/20 0503  HGB 10.3* 8.6* 8.7* 8.6*   Recent Labs    08/21/20 0553 08/22/20 0503  WBC 10.9* 9.3  RBC 3.61* 3.62*  HCT 28.9* 28.4*  PLT 222 223   Recent Labs    08/20/20 0543 08/21/20 0553  NA 137 138  K 3.8 3.5  CL 103 105  CO2 25 25  BUN 16 18  CREATININE 1.14 1.01  GLUCOSE 115* 103*  CALCIUM 7.9* 7.9*   Recent Labs    08/19/20 0910 08/19/20 1040  INR SPECIMEN CLOTTED 1.1    Exam: General - Patient is Alert and Oriented Extremity - Neurologically intact Neurovascular intact Sensation intact distally Dorsiflexion/Plantar flexion intact Dressing/Incision - clean, dry, no drainage Motor Function - intact, moving foot and toes well on exam.   Past Medical History:  Diagnosis Date  . Ascending aorta dilatation (HCC)    32mm by chest CT angio 04/2018  . CAD (coronary artery disease)    a. 05/2006 Abnl stress test w/ 2-57mm ST dep; b. 05/2006 Cath/PCI: LM nl, LAD 30-40ost, 20-30p, 90d (small), D1 nl, D2 nl, LCX nl, OM1/2 nl, RCA  Ca2+, 33m/d (3.5x16 Liberty BMS & 3.5x12 Liberty BMS), EF 50%.  . Chronic diastolic CHF (congestive heart failure) ([place])    a. 10/2017 Echo: EF 50-55%, mild AI, sev dil LA, mod dil RA.  Marland Kitchen Chronic venous insufficiency   . HTN (hypertension)   . PAF (paroxysmal atrial fibrillation) ([place])    a. CHA2DS2VASc 5-->Eliquis; b. Prev WCT on flecainide;  c. 12/2017 s/p DCCV.  Marland Kitchen Pneumothorax   . Ventricular tachycardia (HCC)    a. possibly proarrhythmia from flecainide.    Assessment/Plan: 3 Days Post-Op Procedure(s) (LRB): TOTAL HIP ARTHROPLASTY ANTERIOR APPROACH (Right) Principal Problem:   OA (osteoarthritis) of hip Active Problems:   Osteoarthritis of right hip  Estimated body mass index is 17.92 kg/m as calculated from the following:   Height as of this encounter: 6\' 3"  (1.905 m).   Weight as of this encounter: 65 kg. Up with therapy  DVT Prophylaxis - Eliquis Weight-bearing as tolerated  Plan for discharge today, hydrocodone discontinued yesterday. Will order HHPT to come out for 2 weeks.  Follow-up in the office February 15th.  Theresa Duty, PA-C Orthopedic Surgery [phone] 08/22/2020, 7:12 AM

## 2020-08-22 NOTE — TOC Transition Note (Signed)
Transition of Care [facility]) - CM/SW Discharge Note   Patient Details  Name: KRYSTIAN YOUNGLOVE MRN: [id number] Date of Birth: February 24, 1941  [facility]) CM/SW Contact:  Ross Ludwig, LCSW Phone Number: 08/22/2020, 10:43 AM   Clinical Narrative:     CSW spoke to patient's daughter and informed her that [facility] is able to accept patient for home health services.  CSW spoke to [place] at [place] and they are able to accept patient for home health PT.  Patient will be discharging back home.  Per daughter, they are working on getting extra in home care set up for patient.  CSW emailed a list of home care agencies yesterday in which she acknowledged receiving.  Final next level of care: [place] Barriers to Discharge: Barriers Resolved   Patient Goals and CMS Choice Patient states their goals for this hospitalization and ongoing recovery are:: To return back home with home health. CMS Medicare.gov Compare Post Acute Care list provided to:: Patient Represenative (must comment) Choice offered to / list presented to : Adult Children  Discharge Placement                       Discharge Plan and Services                DME Arranged: N/A         HH Arranged: PT HH Agency: [facility] Date [place] [facility] Contacted: 08/22/20 Time [place]: [phone] Representative spoke with at [place]: [place] ([place]) Interventions     Readmission Risk Interventions Readmission Risk Prevention Plan 03/03/2018  Post Dischage Appt Complete  Medication Screening Complete  Transportation Screening Complete  PCP follow-up Complete  Some recent data might be hidden

## 2020-08-22 NOTE — Progress Notes (Signed)
Physical Therapy Treatment Patient Details Name: [name] MRN: [id number] DOB: 30-Sep-1940 Today's Date: 08/22/2020    History of Present Illness Patient is an 80 yo male s/p Rt THA anterior approach on 08/19/2020 with PMH significant for v-tach, pneumothroax, CAD, venous insufficiency, CHF, HTN, Paroxysmal a-fib, Lt TKA (2019) and Lt THA.    PT Comments    Patient continues to require repeated cues for safety with all functional mobility. Min assist needed to prevent LOB with gait and stairs. Pt's daughter present and provided assist with instructions from therapist. Educated pt and daughter on 24/7 supervision and assist needed for all mobility to reduce fall risk. Patient somewhat resistant to education but agreeable at EOS. Pt's daughter has [facility] services set up to provide assist and care as needed. Acute PT will continue to progress pt as able.    Follow Up Recommendations  Home health PT;Supervision for mobility/OOB;Supervision/Assistance - 24 hour (Pt will require 24/7 supervision/assist due to)     Equipment Recommendations  None recommended by PT    Recommendations for Other Services       Precautions / Restrictions Precautions Precautions: Fall Restrictions Weight Bearing Restrictions: No RLE Weight Bearing: Weight bearing as tolerated Other Position/Activity Restrictions: WBAT    Mobility  Bed Mobility Overal bed mobility: Needs Assistance Bed Mobility: Supine to Sit     Supine to sit: HOB elevated;Min guard     General bed mobility comments: guarding for safety, pt able to bring Rt LE off EOB with extra time.  Transfers Overall transfer level: Needs assistance Equipment used: Rolling walker (2 wheeled) Transfers: Sit to/from Stand Sit to Stand: Min assist         General transfer comment: Cues for safe hand placement to rise from EOB and to maintain hands on RW standing. cues for safe reach back to sit in recliner. pt walking bil LE's forward before  lowering and cues needed for safety to instruct pt to sit back farther in chair rather than on edge to prevent sliding off.  Ambulation/Gait Ambulation/Gait assistance: Min assist Gait Distance (Feet): 40 Feet Assistive device: Rolling walker (2 wheeled) Gait Pattern/deviations: Step-to pattern;Decreased stride length;Decreased weight shift to right;Narrow base of support Gait velocity: decr   General Gait Details: MIN assist for safety with cues for step to gait pattern, to keep hands on RW for stability, and for safe proximity to RW. pt occasionally moving walker outside BOS quickly and cues needed for safety. pt's daughter present and educated on importance of cuing and on safe guarding position.   Stairs Stairs: Yes Stairs assistance: Min assist;+2 safety/equipment Stair Management: No rails;Forwards;With walker Number of Stairs: 2 General stair comments: Min assist required for safety and to steady. pt has tendency to pick hands up from walker requiring repeated cues for safety. pt's daughter present and provided safe guarding for ascending/descending steps. educated on importance of second person being available to help with entering home.   Wheelchair Mobility    Modified Rankin (Stroke Patients Only)       Balance Overall balance assessment: Needs assistance Sitting-balance support: Feet supported Sitting balance-Leahy Scale: Fair     Standing balance support: Bilateral upper extremity supported Standing balance-Leahy Scale: Poor Standing balance comment: reliant on external support for static and dynamic standing.                            Cognition Arousal/Alertness: Awake/alert Behavior During Therapy: WFL for tasks assessed/performed  Overall Cognitive Status: Within Functional Limits for tasks assessed                                        Exercises Total Joint Exercises Ankle Circles/Pumps: AROM;Both;10 reps;Seated Quad Sets:  AROM;Right;Seated;5 reps Short Arc Quad: AROM;Right;5 reps;Seated Hip ABduction/ADduction: AROM;Right;Seated;5 reps    General Comments        Pertinent Vitals/Pain Pain Assessment: Faces Pain Location: Rt thigh Pain Descriptors / Indicators: Discomfort;Sore;Tender;Grimacing Pain Intervention(s): Limited activity within patient's tolerance;Monitored during session;Repositioned    Home Living                      Prior Function            PT Goals (current goals can now be found in the care plan section) Acute Rehab PT Goals Patient Stated Goal: want to return home but have someone there for supervision and improve LE strength. PT Goal Formulation: With patient Time For Goal Achievement: 08/26/20 Potential to Achieve Goals: Good Progress towards PT goals: Progressing toward goals    Frequency    7X/week      PT Plan Current plan remains appropriate    Co-evaluation              AM-PAC PT "6 Clicks" Mobility   Outcome Measure  Help needed turning from your back to your side while in a flat bed without using bedrails?: A Little Help needed moving from lying on your back to sitting on the side of a flat bed without using bedrails?: A Little Help needed moving to and from a bed to a chair (including a wheelchair)?: A Little Help needed standing up from a chair using your arms (e.g., wheelchair or bedside chair)?: A Little Help needed to walk in hospital room?: A Little Help needed climbing 3-5 steps with a railing? : A Little 6 Click Score: 18    End of Session Equipment Utilized During Treatment: Gait belt Activity Tolerance: Patient tolerated treatment well Patient left: in chair;with call bell/phone within reach;with chair alarm set;with family/visitor present Nurse Communication: Mobility status PT Visit Diagnosis: Muscle weakness (generalized) (M62.81);Difficulty in walking, not elsewhere classified (R26.2) Pain - Right/Left: Right Pain - part  of body: Hip     Time: 0814-4818 PT Time Calculation (min) (ACUTE ONLY): 29 min  Charges:  $Gait Training: 23-37 mins                     Verner Mould, DPT Acute Rehabilitation Services Office [phone] Pager [phone]     Jacques Navy 08/22/2020, 2:51 PM

## 2020-08-23 NOTE — Discharge Summary (Signed)
Physician Discharge Summary   Patient ID: [name] MRN: [id number] DOB/AGE: Oct 30, 1940 80 y.o.  Admit date: 08/19/2020 Discharge date: 08/22/2020  Primary Diagnosis: Osteoarthritis, right hip   Admission Diagnoses:  Past Medical History:  Diagnosis Date  . Ascending aorta dilatation (HCC)    79mm by chest CT angio 04/2018  . CAD (coronary artery disease)    a. 05/2006 Abnl stress test w/ 2-26mm ST dep; b. 05/2006 Cath/PCI: LM nl, LAD 30-40ost, 20-30p, 90d (small), D1 nl, D2 nl, LCX nl, OM1/2 nl, RCA Ca2+, 84m/d (3.5x16 Liberty BMS & 3.5x12 Liberty BMS), EF 50%.  . Chronic diastolic CHF (congestive heart failure) ([place])    a. 10/2017 Echo: EF 50-55%, mild AI, sev dil LA, mod dil RA.  Marland Kitchen Chronic venous insufficiency   . HTN (hypertension)   . PAF (paroxysmal atrial fibrillation) ([place])    a. CHA2DS2VASc 5-->Eliquis; b. Prev WCT on flecainide;  c. 12/2017 s/p DCCV.  Marland Kitchen Pneumothorax   . Ventricular tachycardia (HCC)    a. possibly proarrhythmia from flecainide.   Discharge Diagnoses:   Principal Problem:   OA (osteoarthritis) of hip Active Problems:   Osteoarthritis of right hip  Estimated body mass index is 17.92 kg/m as calculated from the following:   Height as of this encounter: 6\' 3"  (1.905 m).   Weight as of this encounter: 65 kg.  Procedure:  Procedure(s) (LRB): TOTAL HIP ARTHROPLASTY ANTERIOR APPROACH (Right)   Consults: None  HPI: [name] is a 80 y.o. male who has advanced end-stage arthritis of their Right  hip with progressively worsening pain and dysfunction.The patient has failed nonoperative management and presents for total hip arthroplasty.   Laboratory Data: Admission on 08/19/2020, Discharged on 08/22/2020  Component Date Value Ref Range Status  . WBC 08/19/2020 7.0  4.0 - 10.5 K/uL Final  . RBC 08/19/2020 4.34  4.22 - 5.81 MIL/uL Final  . Hemoglobin 08/19/2020 10.3* 13.0 - 17.0 g/dL Final  . HCT 08/19/2020 34.7* 39.0 - 52.0 % Final  . MCV  08/19/2020 80.0  80.0 - 100.0 fL Final  . MCH 08/19/2020 23.7* 26.0 - 34.0 pg Final  . MCHC 08/19/2020 29.7* 30.0 - 36.0 g/dL Final  . RDW 08/19/2020 19.7* 11.5 - 15.5 % Final  . Platelets 08/19/2020 194  150 - 400 K/uL Final  . nRBC 08/19/2020 0.0  0.0 - 0.2 % Final   Performed at [facility], [place] [address], [place], [place] [address]  . Sodium 08/19/2020 140  135 - 145 mmol/L Final  . Potassium 08/19/2020 4.1  3.5 - 5.1 mmol/L Final  . Chloride 08/19/2020 103  98 - 111 mmol/L Final  . CO2 08/19/2020 26  22 - 32 mmol/L Final  . Glucose, Bld 08/19/2020 99  70 - 99 mg/dL Final   Glucose reference range applies only to samples taken after fasting for at least 8 hours.  . BUN 08/19/2020 21  8 - 23 mg/dL Final  . Creatinine, Ser 08/19/2020 0.90  0.61 - 1.24 mg/dL Final  . Calcium 08/19/2020 9.4  8.9 - 10.3 mg/dL Final  . Total Protein 08/19/2020 6.6  6.5 - 8.1 g/dL Final  . Albumin 08/19/2020 3.6  3.5 - 5.0 g/dL Final  . AST 08/19/2020 23  15 - 41 U/L Final  . ALT 08/19/2020 18  0 - 44 U/L Final  . Alkaline Phosphatase 08/19/2020 88  38 - 126 U/L Final  . Total Bilirubin 08/19/2020 0.6  0.3 - 1.2 mg/dL Final  .  GFR, Estimated 08/19/2020 >60  >60 mL/min Final   Comment: (NOTE) Calculated using the CKD-EPI Creatinine Equation (2021)   . Anion gap 08/19/2020 11  5 - 15 Final   Performed at [facility], [place] [address], [place], [place] [address]  . Prothrombin Time 08/19/2020 SPECIMEN CLOTTED  11.4 - 15.2 seconds Corrected   Comment: REORDERED [id number] @ 1030 ON [id number] BY POTEAT,S CORRECTED ON 01/31 AT 1030: PREVIOUSLY REPORTED AS 13.1   . INR 08/19/2020 SPECIMEN CLOTTED  0.8 - 1.2 Corrected   Comment: REORDERED [id number] @ 1030 ON [id number] BY POTEAT,S Performed at [facility], [place] [address] [place], [place] [address] CORRECTED ON 01/31 AT 1030: PREVIOUSLY REPORTED AS 1.0   . aPTT 08/19/2020 SPECIMEN CLOTTED  24 - 36 seconds  Corrected   Comment: REORDERED [id number] @ [id number] BY POTEAT,S Performed at [facility], [place] [address] [place], [place] [address] CORRECTED ON 01/31 AT 1030: PREVIOUSLY REPORTED AS 20   . ABO/RH(D) 08/19/2020 O POS   Final  . Antibody Screen 08/19/2020 NEG   Final  . Sample Expiration 08/19/2020    Final                   Value:08/22/2020,2359 Performed at [facility], [place] [address], [place], [place] [address]   . Prothrombin Time 08/19/2020 14.0  11.4 - 15.2 seconds Final  . INR 08/19/2020 1.1  0.8 - 1.2 Final   Comment: (NOTE) INR goal varies based on device and disease states. Performed at [facility], [place] [address] [place], [place] [address]   . aPTT 08/19/2020 37* 24 - 36 seconds Final   Comment:        IF BASELINE aPTT IS ELEVATED, SUGGEST PATIENT RISK ASSESSMENT BE USED TO DETERMINE APPROPRIATE ANTICOAGULANT THERAPY. Performed at [facility], [place] [address] [place], [place] [address]   . WBC 08/20/2020 8.8  4.0 - 10.5 K/uL Final  . RBC 08/20/2020 3.55* 4.22 - 5.81 MIL/uL Final  . Hemoglobin 08/20/2020 8.6* 13.0 - 17.0 g/dL Final  . HCT 08/20/2020 28.0* 39.0 - 52.0 % Final  . MCV 08/20/2020 78.9* 80.0 - 100.0 fL Final  . MCH 08/20/2020 24.2* 26.0 - 34.0 pg Final  . MCHC 08/20/2020 30.7  30.0 - 36.0 g/dL Final  . RDW 08/20/2020 19.5* 11.5 - 15.5 % Final  . Platelets 08/20/2020 209  150 - 400 K/uL Final  . nRBC 08/20/2020 0.0  0.0 - 0.2 % Final   Performed at [facility], [place] [address], [place], [place] [address]  . Sodium 08/20/2020 137  135 - 145 mmol/L Final  . Potassium 08/20/2020 3.8  3.5 - 5.1 mmol/L Final  . Chloride 08/20/2020 103  98 - 111 mmol/L Final  . CO2 08/20/2020 25  22 - 32 mmol/L Final  . Glucose, Bld 08/20/2020 115* 70 - 99 mg/dL Final   Glucose reference range applies only to samples taken after fasting for at least 8 hours.  . BUN  08/20/2020 16  8 - 23 mg/dL Final  . Creatinine, Ser 08/20/2020 1.14  0.61 - 1.24 mg/dL Final  . Calcium 08/20/2020 7.9* 8.9 - 10.3 mg/dL Final  . GFR, Estimated 08/20/2020 >60  >60 mL/min Final   Comment: (NOTE) Calculated using the CKD-EPI Creatinine Equation (2021)   . Anion gap 08/20/2020 9  5 - 15 Final   Performed at [facility], [place]  [address] [place], [place] [address]  . WBC 08/21/2020 10.9* 4.0 - 10.5 K/uL Final  . RBC 08/21/2020 3.61* 4.22 - 5.81 MIL/uL Final  . Hemoglobin 08/21/2020 8.7* 13.0 - 17.0 g/dL Final  . HCT 08/21/2020 28.9* 39.0 - 52.0 % Final  . MCV 08/21/2020 80.1  80.0 - 100.0 fL Final  . MCH 08/21/2020 24.1* 26.0 - 34.0 pg Final  . MCHC 08/21/2020 30.1  30.0 - 36.0 g/dL Final  . RDW 08/21/2020 19.8* 11.5 - 15.5 % Final  . Platelets 08/21/2020 222  150 - 400 K/uL Final  . nRBC 08/21/2020 0.0  0.0 - 0.2 % Final   Performed at [facility], [place] [address] [place], [place] [address]  . Sodium 08/21/2020 138  135 - 145 mmol/L Final  . Potassium 08/21/2020 3.5  3.5 - 5.1 mmol/L Final  . Chloride 08/21/2020 105  98 - 111 mmol/L Final  . CO2 08/21/2020 25  22 - 32 mmol/L Final  . Glucose, Bld 08/21/2020 103* 70 - 99 mg/dL Final   Glucose reference range applies only to samples taken after fasting for at least 8 hours.  . BUN 08/21/2020 18  8 - 23 mg/dL Final  . Creatinine, Ser 08/21/2020 1.01  0.61 - 1.24 mg/dL Final  . Calcium 08/21/2020 7.9* 8.9 - 10.3 mg/dL Final  . GFR, Estimated 08/21/2020 >60  >60 mL/min Final   Comment: (NOTE) Calculated using the CKD-EPI Creatinine Equation (2021)   . Anion gap 08/21/2020 8  5 - 15 Final   Performed at [facility], [place] [address], [place], [place] [address]  . WBC 08/22/2020 9.3  4.0 - 10.5 K/uL Final  . RBC 08/22/2020 3.62* 4.22 - 5.81 MIL/uL Final  . Hemoglobin 08/22/2020 8.6* 13.0 - 17.0 g/dL Final  . HCT 08/22/2020 28.4* 39.0 - 52.0 % Final  . MCV  08/22/2020 78.5* 80.0 - 100.0 fL Final  . MCH 08/22/2020 23.8* 26.0 - 34.0 pg Final  . MCHC 08/22/2020 30.3  30.0 - 36.0 g/dL Final  . RDW 08/22/2020 19.7* 11.5 - 15.5 % Final  . Platelets 08/22/2020 223  150 - 400 K/uL Final  . nRBC 08/22/2020 0.0  0.0 - 0.2 % Final   Performed at [facility], [place] [address], [place], [place] [address]  Hospital Outpatient Visit on 08/17/2020  Component Date Value Ref Range Status  . SARS Coronavirus 2 08/17/2020 NEGATIVE  NEGATIVE Final   Comment: (NOTE) SARS-CoV-2 target nucleic acids are NOT DETECTED.  The SARS-CoV-2 RNA is generally detectable in upper and lower respiratory specimens during the acute phase of infection. Negative results do not preclude SARS-CoV-2 infection, do not rule out co-infections with other pathogens, and should not be used as the sole basis for treatment or other patient management decisions. Negative results must be combined with clinical observations, patient history, and epidemiological information. The expected result is Negative.  Fact Sheet for Patients: SugarRoll.be  Fact Sheet for Healthcare Providers: https://www.woods-mathews.com/  This test is not yet approved or cleared by the [place] FDA and  has been authorized for detection and/or diagnosis of SARS-CoV-2 by FDA under an Emergency Use Authorization (EUA). This EUA will remain  in effect (meaning this test can be used) for the duration of the COVID-19 declaration under Se                          ction 564(b)(1) of the Act, 21 U.S.C. section 360bbb-3(b)(1),  unless the authorization is terminated or revoked sooner.  Performed at [facility], [place] [address], [place], [place] [address]   Hospital Outpatient Visit on 07/29/2020  Component Date Value Ref Range Status  . MRSA, PCR 07/29/2020 NEGATIVE  NEGATIVE Final  . Staphylococcus aureus 07/29/2020 NEGATIVE  NEGATIVE Final    Comment: (NOTE) The Xpert SA Assay (FDA approved for NASAL specimens in patients 54 years of age and older), is one component of a comprehensive surveillance program. It is not intended to diagnose infection nor to guide or monitor treatment. Performed at [facility], [place] [address] [place], [place] [address]   . WBC 07/29/2020 7.9  4.0 - 10.5 K/uL Final  . RBC 07/29/2020 4.11* 4.22 - 5.81 MIL/uL Final  . Hemoglobin 07/29/2020 9.5* 13.0 - 17.0 g/dL Final  . HCT 07/29/2020 32.1* 39.0 - 52.0 % Final  . MCV 07/29/2020 78.1* 80.0 - 100.0 fL Final  . MCH 07/29/2020 23.1* 26.0 - 34.0 pg Final  . MCHC 07/29/2020 29.6* 30.0 - 36.0 g/dL Final  . RDW 07/29/2020 18.8* 11.5 - 15.5 % Final  . Platelets 07/29/2020 284  150 - 400 K/uL Final  . nRBC 07/29/2020 0.0  0.0 - 0.2 % Final   Performed at [facility], [place] [address], [place], [place] [address]  . Sodium 07/29/2020 139  135 - 145 mmol/L Final  . Potassium 07/29/2020 4.1  3.5 - 5.1 mmol/L Final  . Chloride 07/29/2020 103  98 - 111 mmol/L Final  . CO2 07/29/2020 25  22 - 32 mmol/L Final  . Glucose, Bld 07/29/2020 95  70 - 99 mg/dL Final   Glucose reference range applies only to samples taken after fasting for at least 8 hours.  . BUN 07/29/2020 15  8 - 23 mg/dL Final  . Creatinine, Ser 07/29/2020 1.05  0.61 - 1.24 mg/dL Final  . Calcium 07/29/2020 8.9  8.9 - 10.3 mg/dL Final  . Total Protein 07/29/2020 6.3* 6.5 - 8.1 g/dL Final  . Albumin 07/29/2020 3.6  3.5 - 5.0 g/dL Final  . AST 07/29/2020 20  15 - 41 U/L Final  . ALT 07/29/2020 18  0 - 44 U/L Final  . Alkaline Phosphatase 07/29/2020 78  38 - 126 U/L Final  . Total Bilirubin 07/29/2020 0.7  0.3 - 1.2 mg/dL Final  . GFR, Estimated 07/29/2020 >60  >60 mL/min Final   Comment: (NOTE) Calculated using the CKD-EPI Creatinine Equation (2021)   . Anion gap 07/29/2020 11  5 - 15 Final   Performed at [facility], [place] [address]  [place], [place] [address]  . Prothrombin Time 07/29/2020 13.7  11.4 - 15.2 seconds Final  . INR 07/29/2020 1.1  0.8 - 1.2 Final   Comment: (NOTE) INR goal varies based on device and disease states. Performed at [facility], [place] [address] [place], [place] [address]   . aPTT 07/29/2020 36  24 - 36 seconds Final   Performed at [facility], [place] [address], [place],  [address]  . ABO/RH(D) 07/29/2020 O POS   Final  . Antibody Screen 07/29/2020 NEG   Final  . Sample Expiration 07/29/2020 08/03/2020,2359   Final  . Extend sample reason 07/29/2020    Final                   Value:NO TRANSFUSIONS OR PREGNANCY IN THE PAST 3 MONTHS Performed at [facility], [place]  [address], [place], [place] [address]   Hospital Outpatient Visit on 07/29/2020  Component Date Value Ref Range Status  . SARS Coronavirus 2 07/29/2020 NEGATIVE  NEGATIVE Final   Comment: (NOTE) SARS-CoV-2 target nucleic acids are NOT DETECTED.  The SARS-CoV-2 RNA is generally detectable in upper and lower respiratory specimens during the acute phase of infection. Negative results do not preclude SARS-CoV-2 infection, do not rule out co-infections with other pathogens, and should not be used as the sole basis for treatment or other patient management decisions. Negative results must be combined with clinical observations, patient history, and epidemiological information. The expected result is Negative.  Fact Sheet for Patients: SugarRoll.be  Fact Sheet for Healthcare Providers: https://www.woods-mathews.com/  This test is not yet approved or cleared by the [place] FDA and  has been authorized for detection and/or diagnosis of SARS-CoV-2 by FDA under an Emergency Use Authorization (EUA). This EUA will remain  in effect (meaning this test can be used) for the duration of the COVID-19 declaration under Se                           ction 564(b)(1) of the Act, 21 U.S.C. section 360bbb-3(b)(1), unless the authorization is terminated or revoked sooner.  Performed at [facility], [place] [address] [place], [place] [address]   Appointment on 07/22/2020  Component Date Value Ref Range Status  . Rest HR 07/22/2020 46  bpm Final  . Rest BP 07/22/2020 142/83  mmHg Final  . Peak HR 07/22/2020 60  bpm Final  . Peak BP 07/22/2020 155/74  mmHg Final  . SSS 07/22/2020 1   Final  . SRS 07/22/2020 0   Final  . SDS 07/22/2020 1   Final  . TID 07/22/2020 1.08   Final  . LV sys vol 07/22/2020 51  mL Final  . LV dias vol 07/22/2020 132  62 - 150 mL Final  Hospital Outpatient Visit on 07/17/2020  Component Date Value Ref Range Status  . S' Lateral 07/17/2020 3.10  cm Final  . Area-P 1/2 07/17/2020 2.95  cm2 Final  . MV M vel 07/17/2020 4.86  m/s Final  . MV Peak grad 07/17/2020 94.5  mmHg Final  . P 1/2 time 07/17/2020 622  msec Final     X-Rays:DG Pelvis Portable  Result Date: 08/19/2020 CLINICAL DATA:  Post hip replacement EXAM: PORTABLE PELVIS 1-2 VIEWS COMPARISON:  Earlier intraoperative images of 08/19/2020 FINDINGS: Osseous demineralization. BILATERAL hip prostheses identified. Postsurgical changes of the soft tissues lateral to the RIGHT hip are seen. No acute fracture, dislocation, or bone destruction. IMPRESSION: BILATERAL hip prostheses and osseous demineralization. No acute osseous abnormalities. Electronically Signed   By: Lavonia Dana M.D.   On: 08/19/2020 12:51   DG C-Arm 1-60 Min-No Report  Result Date: 08/19/2020 Fluoroscopy was utilized by the requesting physician.  No radiographic interpretation.   DG HIP OPERATIVE UNILAT WITH PELVIS RIGHT  Result Date: 08/19/2020 CLINICAL DATA:  Right anterior hip replacement. EXAM: OPERATIVE RIGHT HIP (WITH PELVIS IF PERFORMED) 9 VIEWS TECHNIQUE: Fluoroscopic spot image(s) were submitted for interpretation post-operatively. COMPARISON:  07/08/2009 FINDINGS:  Fluoro time: 8 seconds. Nine C-arm fluoroscopic images were obtained intraoperatively and submitted for post operative interpretation. These images demonstrate surgical changes associated with right total hip arthroplasty. No unexpected findings. Partially imaged prior left total hip arthroplasty. Please see the performing provider's procedural report for further detail. IMPRESSION: Intraoperative fluoroscopic imaging, as detailed above. Electronically  Signed   By: Margaretha Sheffield MD   On: 08/19/2020 12:27    EKG: Orders placed or performed in visit on 10/11/19  . EKG 12-Lead     Hospital Course: [name] is a 80 y.o. who was admitted to [facility] They were brought to the operating room on 08/19/2020 and underwent Procedure(s): [place].  Patient tolerated the procedure well and was later transferred to the recovery room and then to the orthopaedic floor for postoperative care. They were given PO and IV analgesics for pain control following their surgery. They were given 24 hours of postoperative antibiotics of  Anti-infectives (From admission, onward)   Start     Dose/Rate Route Frequency Ordered Stop   08/19/20 1700  ceFAZolin (ANCEF) IVPB 2g/100 mL premix        2 g 200 mL/hr over 30 Minutes Intravenous Every 6 hours 08/19/20 1350 08/20/20 0140   08/19/20 0830  ceFAZolin (ANCEF) IVPB 2g/100 mL premix        2 g 200 mL/hr over 30 Minutes Intravenous On call to O.R. 08/19/20 5009 08/19/20 1055     and started on DVT prophylaxis in the form of Eliquis.   PT and OT were ordered for total joint protocol. Discharge planning consulted to help with postop disposition and equipment needs. Patient had a good night on the evening of surgery. They started to get up OOB with therapy on POD #0. Continued to work with therapy into POD #2. Due to limited assistance at home, patient stayed through POD #3 to maximize independence and mobility. He was meeting  his goals with physical therapy and was ready to go home. He was discharged in stable condition  Diet: Cardiac diet Activity: WBAT Follow-up: in 2 weeks Disposition: Home with HHPT Discharged Condition: stable   Discharge Instructions    Call MD / Call 911   Complete by: As directed    If you experience chest pain or shortness of breath, CALL 911 and be transported to the hospital emergency room.  If you develope a fever above 101 F, pus (white drainage) or increased drainage or redness at the wound, or calf pain, call your surgeon's office.   Change dressing   Complete by: As directed    You have an adhesive waterproof bandage over the incision. Leave this in place until your first follow-up appointment. Once you remove this you will not need to place another bandage.   Constipation Prevention   Complete by: As directed    Drink plenty of fluids.  Prune juice may be helpful.  You may use a stool softener, such as Colace (over the counter) 100 mg twice a day.  Use MiraLax (over the counter) for constipation as needed.   Diet - low sodium heart healthy   Complete by: As directed    Do not sit on low chairs, stoools or toilet seats, as it may be difficult to get up from low surfaces   Complete by: As directed    Driving restrictions   Complete by: As directed    No driving for two weeks   TED hose   Complete by: As directed    Use stockings (TED hose) for three weeks on both leg(s).  You may remove them at night for sleeping.   Weight bearing as tolerated   Complete by: As directed      Allergies as of 08/22/2020   No Known Allergies  Medication List    TAKE these medications   acetaminophen 325 MG tablet Commonly known as: TYLENOL Take 650 mg by mouth every 6 (six) hours as needed for moderate pain.   amiodarone 200 MG tablet Commonly known as: PACERONE TAKE 1 TABLET BY MOUTH EVERY DAY   ascorbic acid 500 MG tablet Commonly known as: VITAMIN C Take 500 mg by mouth  daily.   atorvastatin 20 MG tablet Commonly known as: LIPITOR TAKE 1 TABLET BY MOUTH EVERY DAY   BOSWELLIA PO Take 1,200 mg by mouth daily.   CoQ10 100 MG Caps Take 100 mg by mouth daily.   Eliquis 5 MG Tabs tablet Generic drug: apixaban TAKE 1 TABLET BY MOUTH TWICE A DAY What changed: how much to take   Fish Oil 1200 MG Caps Take 1,200 mg by mouth daily.   furosemide 20 MG tablet Commonly known as: LASIX Take 1 tablet (20 mg total) by mouth every evening.   Glucosamine Chondroitin Triple Tabs Take 1 tablet by mouth daily.   HYDROcodone-acetaminophen 5-325 MG tablet Commonly known as: NORCO/VICODIN Take 1-2 tablets by mouth every 6 (six) hours as needed for moderate pain or severe pain.   Magnesium 250 MG Tabs Take 250 mg by mouth daily.   methocarbamol 500 MG tablet Commonly known as: ROBAXIN Take 1 tablet (500 mg total) by mouth every 6 (six) hours as needed for muscle spasms.   multivitamin with minerals Tabs tablet Take 1 tablet by mouth daily.   pindolol 10 MG tablet Commonly known as: VISKEN Take 0.5 tablets (5 mg total) by mouth 2 (two) times daily.   traMADol 50 MG tablet Commonly known as: ULTRAM Take 1-2 tablets (50-100 mg total) by mouth every 6 (six) hours as needed for moderate pain.   Zinc 50 MG Caps Take 100 mg by mouth 3 (three) times a week.            Discharge Care Instructions  (From admission, onward)         Start     Ordered   08/20/20 0000  Weight bearing as tolerated        08/20/20 0715   08/20/20 0000  Change dressing       Comments: You have an adhesive waterproof bandage over the incision. Leave this in place until your first follow-up appointment. Once you remove this you will not need to place another bandage.   08/20/20 0715          Follow-up Information    Gaynelle Arabian, MD. Go on 09/03/2020.   Specialty: Orthopedic Surgery Why: You are scheduled for first post op appointment on Tuesday February 15th at  2:00pm. Contact information: [address] [place] [address] [id number]               Signed: Theresa Duty, PA-C Orthopedic Surgery 08/23/2020, 9:25 AM

## 2020-08-27 ENCOUNTER — Other Ambulatory Visit: Payer: Self-pay | Admitting: Cardiology

## 2020-10-05 ENCOUNTER — Other Ambulatory Visit: Payer: Self-pay | Admitting: Cardiology

## 2020-12-18 ENCOUNTER — Other Ambulatory Visit: Payer: Self-pay

## 2020-12-18 ENCOUNTER — Encounter: Payer: Self-pay | Admitting: Cardiology

## 2020-12-18 ENCOUNTER — Ambulatory Visit (INDEPENDENT_AMBULATORY_CARE_PROVIDER_SITE_OTHER): Payer: Medicare Other | Admitting: Cardiology

## 2020-12-18 VITALS — BP 140/74 | HR 49 | Ht 74.0 in | Wt 155.8 lb

## 2020-12-18 DIAGNOSIS — I2510 Atherosclerotic heart disease of native coronary artery without angina pectoris: Secondary | ICD-10-CM | POA: Diagnosis not present

## 2020-12-18 DIAGNOSIS — I2583 Coronary atherosclerosis due to lipid rich plaque: Secondary | ICD-10-CM | POA: Diagnosis not present

## 2020-12-18 DIAGNOSIS — E785 Hyperlipidemia, unspecified: Secondary | ICD-10-CM

## 2020-12-18 DIAGNOSIS — I10 Essential (primary) hypertension: Secondary | ICD-10-CM | POA: Diagnosis not present

## 2020-12-18 DIAGNOSIS — I4819 Other persistent atrial fibrillation: Secondary | ICD-10-CM | POA: Diagnosis not present

## 2020-12-18 DIAGNOSIS — I495 Sick sinus syndrome: Secondary | ICD-10-CM

## 2020-12-18 DIAGNOSIS — I5032 Chronic diastolic (congestive) heart failure: Secondary | ICD-10-CM

## 2020-12-18 DIAGNOSIS — I77810 Thoracic aortic ectasia: Secondary | ICD-10-CM

## 2020-12-18 MED ORDER — AMIODARONE HCL 200 MG PO TABS: 200.0000 mg | ORAL_TABLET | Freq: Every day | ORAL | 3 refills | Status: DC

## 2020-12-18 MED ORDER — PINDOLOL 10 MG PO TABS: 5.0000 mg | ORAL_TABLET | Freq: Two times a day (BID) | ORAL | 3 refills | Status: DC

## 2020-12-18 MED ORDER — FUROSEMIDE 20 MG PO TABS: 20.0000 mg | ORAL_TABLET | Freq: Every evening | ORAL | 3 refills | Status: DC

## 2020-12-18 MED ORDER — ATORVASTATIN CALCIUM 20 MG PO TABS: 1.0000 | ORAL_TABLET | Freq: Every day | ORAL | 3 refills | Status: DC

## 2020-12-18 NOTE — Addendum Note (Signed)
Addended by: Antonieta Iba on: 12/18/2020 02:49 PM   Modules accepted: Orders

## 2020-12-18 NOTE — Addendum Note (Signed)
Addended by: Antonieta Iba on: 12/18/2020 03:18 PM   Modules accepted: Orders

## 2020-12-18 NOTE — Patient Instructions (Signed)
Medication Instructions:  Your physician recommends that you continue on your current medications as directed. Please refer to the Current Medication list given to you today.  *If you need a refill on your cardiac medications before your next appointment, please call your pharmacy*   Lab Work: TODAY: TSH, ALT, and FLP If you have labs (blood work) drawn today and your tests are completely normal, you will receive your results only by: Marland Kitchen MyChart Message (if you have MyChart) OR . A paper copy in the mail If you have any lab test that is abnormal or we need to change your treatment, we will call you to review the results.  Follow-Up: At [facility], you and your health needs are our priority.  As part of our continuing mission to provide you with exceptional heart care, we have created designated Provider Care Teams.  These Care Teams include your primary Cardiologist (physician) and Advanced Practice Providers (APPs -  Physician Assistants and Nurse Practitioners) who all work together to provide you with the care you need, when you need it.  Your next appointment:   6 month(s)  The format for your next appointment:   In Person  Provider:   You may see Fransico Him, MD or one of the following Advanced Practice Providers on your designated Care Team:    Melina Copa, PA-C  Ermalinda Barrios, PA-C

## 2020-12-18 NOTE — Progress Notes (Signed)
Cardiology Office Note:    Date:  12/18/2020   ID:  [name], DOB 11/20/1940, MRN [id number]  PCP:  Seward Carol, MD  Cardiologist:  Fransico Him, MD    Referring MD: Seward Carol, MD   Chief Complaint  Patient presents with  . Coronary Artery Disease  . Hyperlipidemia  . Atrial Fibrillation  . Congestive Heart Failure    History of Present Illness:    [name] is a 80 y.o. 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 Cyndia Bent (4.5cm by CT 17/7939)QZE chronicdiastolic CHF.   He is here today for followup and is doing well.  He denies any chest pain or pressure, SOB, DOE (except with extreme yard work), PND, orthopnea, LE edema, dizziness, palpitations or syncope. He is compliant with his meds and is tolerating meds with no SE.    Past Medical History:  Diagnosis Date  . Ascending aorta dilatation (HCC)    70mm by chest CT angio 04/2018  . CAD (coronary artery disease)    a. 05/2006 Abnl stress test w/ 2-48mm ST dep; b. 05/2006 Cath/PCI: LM nl, LAD 30-40ost, 20-30p, 90d (small), D1 nl, D2 nl, LCX nl, OM1/2 nl, RCA Ca2+, 48m/d (3.5x16 [place] BMS & 3.5x12 Liberty BMS), EF 50%.  . Chronic diastolic CHF (congestive heart failure) ([place])    a. 10/2017 Echo: EF 50-55%, mild AI, sev dil LA, mod dil RA.  Marland Kitchen Chronic venous insufficiency   . HTN (hypertension)   . PAF (paroxysmal atrial fibrillation) ([place])    a. CHA2DS2VASc 5-->Eliquis; b. Prev WCT on flecainide;  c. 12/2017 s/p DCCV.  Marland Kitchen Pneumothorax   . Ventricular tachycardia (HCC)    a. possibly proarrhythmia from flecainide.    Past Surgical History:  Procedure Laterality Date  . CARDIOVERSION N/A 12/23/2017   Procedure: CARDIOVERSION;  Surgeon: Sueanne Margarita, MD;  Location: [facility] ENDOSCOPY;  Service: Cardiovascular;  Laterality: N/A;   . CARDIOVERSION N/A 03/08/2018   Procedure: CARDIOVERSION;  Surgeon: Sueanne Margarita, MD;  Location: [facility] ENDOSCOPY;  Service: Cardiovascular;  Laterality: N/A;  . INGUINAL HERNIA REPAIR     x2  . left total hip surgery    . TEE WITHOUT CARDIOVERSION N/A 03/08/2018   Procedure: TRANSESOPHAGEAL ECHOCARDIOGRAM (TEE);  Surgeon: Sueanne Margarita, MD;  Location: [facility] ENDOSCOPY;  Service: Cardiovascular;  Laterality: N/A;  . TOTAL HIP ARTHROPLASTY Right 08/19/2020   Procedure: TOTAL HIP ARTHROPLASTY ANTERIOR APPROACH;  Surgeon: Gaynelle Arabian, MD;  Location: [facility] ORS;  Service: Orthopedics;  Laterality: Right;  112min  . TOTAL KNEE ARTHROPLASTY Left 02/28/2018   Procedure: LEFT TOTAL KNEE ARTHROPLASTY;  Surgeon: Gaynelle Arabian, MD;  Location: [facility] ORS;  Service: Orthopedics;  Laterality: Left;  Marland Kitchen VEIN LIGATION AND STRIPPING Left 04/08/2020   Procedure: LEFT LOWER EXTREMITY VEIN LIGATION AND EXCISION OF ULCER;  Surgeon: Angelia Mould, MD;  Location: [facility] OR;  Service: Vascular;  Laterality: Left;    Current Medications: Current Meds  Medication Sig  . acetaminophen (TYLENOL) 325 MG tablet Take 650 mg by mouth every 6 (six) hours as needed for moderate pain.  Marland Kitchen amiodarone (PACERONE) 200 MG tablet TAKE 1 TABLET BY MOUTH EVERY DAY  . ascorbic acid (VITAMIN C) 500 MG tablet Take 500 mg by mouth daily.  Marland Kitchen atorvastatin (LIPITOR) 20 MG tablet TAKE 1 TABLET BY MOUTH EVERY DAY  .  Boswellia Serrata (BOSWELLIA PO) Take 1,200 mg by mouth daily.  . Coenzyme Q10 (COQ10) 100 MG CAPS Take 100 mg by mouth daily.  Marland Kitchen ELIQUIS 5 MG TABS tablet TAKE 1 TABLET BY MOUTH TWICE A DAY  . furosemide (LASIX) 20 MG tablet Take 1 tablet (20 mg total) by mouth every evening.  . Misc Natural Products (GLUCOSAMINE CHONDROITIN TRIPLE) TABS Take 1 tablet by mouth daily.  . Multiple Vitamin (MULTIVITAMIN WITH MINERALS) TABS tablet Take 1 tablet by mouth daily.  . Omega-3 Fatty Acids (FISH OIL) 1200 MG CAPS Take 1,200 mg by mouth daily.   . pindolol (VISKEN) 10 MG tablet TAKE 0.5 TABLETS (5 MG TOTAL) BY MOUTH 2 (TWO) TIMES DAILY.  . traMADol (ULTRAM) 50 MG tablet Take 1-2 tablets (50-100 mg total) by mouth every 6 (six) hours as needed for moderate pain.  . Zinc 50 MG CAPS Take 100 mg by mouth 3 (three) times a week.      Allergies:   Patient has no known allergies.   Social History   Socioeconomic History  . Marital status: Widowed    Spouse name: Not on file  . Number of children: Not on file  . Years of education: Not on file  . Highest education level: Not on file  Occupational History  . Occupation: retired  Tobacco Use  . Smoking status: Never Smoker  . Smokeless tobacco: Never Used  Vaping Use  . Vaping Use: Never used  Substance and Sexual Activity  . Alcohol use: No    Comment: occsaoinal  . Drug use: Yes    Types: Other-see comments  . Sexual activity: Not Currently  Other Topics Concern  . Not on file  Social History Narrative   Lives in [place] by himself but 2 dtrs nearby and help out when necessary.   Social Determinants of Health   Financial Resource Strain: Not on file  Food Insecurity: Not on file  Transportation Needs: Not on file  Physical Activity: Not on file  Stress: Not on file  Social Connections: Not on file     Family History: The patient's family history includes Coronary artery disease in an other family member.  ROS:   Please see the history of present illness.    ROS  All other systems reviewed and negative.   EKGs/Labs/Other Studies Reviewed:    The following studies were reviewed today: EKG and outpt labs from PCP on [facility]  EKG:  EKG is  ordered today.  The ekg ordered today demonstrates sinus bradycardia at 49bpm with no ST changes Recent Labs: 08/19/2020: ALT 18 08/21/2020: BUN 18; Creatinine, Ser 1.01; Potassium 3.5; Sodium 138 08/22/2020: Hemoglobin 8.6; Platelets 223   Recent Lipid Panel    Component Value Date/Time   CHOL 126 10/25/2019 0931   TRIG 40  10/25/2019 0931   HDL 56 10/25/2019 0931   CHOLHDL 2.3 10/25/2019 0931   CHOLHDL 1.9 06/19/2016 0809   VLDL 7 06/19/2016 0809   LDLCALC 60 10/25/2019 0931    Physical Exam:    VS:  BP 140/74   Pulse (!) 49   Ht 6\' 2"  (1.88 m)   Wt 155 lb 12.8 oz (70.7 kg)   SpO2 95%   BMI 20.00 kg/m     Wt Readings from Last 3 Encounters:  12/18/20 155 lb 12.8 oz (70.7 kg)  08/19/20 143 lb 6.4 oz (65 kg)  07/22/20 153 lb (69.4 kg)     GEN: Well nourished, well developed in no acute  distress HEENT: Normal NECK: No JVD; No carotid bruits LYMPHATICS: No lymphadenopathy CARDIAC:RRR, no murmurs, rubs, gallops RESPIRATORY:  Clear to auscultation without rales, wheezing or rhonchi  ABDOMEN: Soft, non-tender, non-distended MUSCULOSKELETAL:  No edema; No deformity  SKIN: Warm and dry NEUROLOGIC:  Alert and oriented x 3 PSYCHIATRIC:  Normal affect    ASSESSMENT:    1. Coronary artery disease due to lipid rich plaque   2. HYPERTENSION, BENIGN   3. Chronic diastolic CHF (congestive heart failure) (HCC)   4. Persistent atrial fibrillation ([place])   5. Ascending aorta dilatation (HCC)   6. Dyslipidemia   7. Tachycardia-bradycardia syndrome ([place])    PLAN:    In order of problems listed above:  1.  ASCAD -s/p BMS to RCA -he has not had any anginal symptoms since I saw him last -Continue prescription drug management with Pindolol 5mg  BID and statin -not on ASA due to DOAC  2.  HTN -BP adequately controlled -Continue prescription drug management with Pindolol 5mg  BID  3.  Chronic diastolic CHF -he does not appear volume overloaded on exam -Continue prescription drug management with Lasix 20mg  daily -I have personally reviewed and interpreted outside labs  performed by patient's orthopedist in Feb 2022 which showed SCr 1.101, K+ 3.5 and Hbg 8.7  4.  Persistent atrial fibrillation -he remains in sinus bradycardia and is asymptomatic -Continue prescription drug management with Amio  200mg  daily, Pindolol 5mg  BID and Eliquis 5mg  BID -he has chronic bradycardia but had PAF with RVR on event monitor so will keep on current dose of Amio and BB since he is completely asymptomatic from his bradycardia and from his afib -no bleeding issues on DOAC -he gets yearly eye exams -check TSH and ALT today -PFTs stable  5.  Ascending aortic aneurysm -chest CTA showed stable 4.4cm aneurysm in the past and repeat last month showed an increase in size and he was referred to CVTS and Dr. Cyndia Bent felt that measurement by radiologist was not correct and was actually 4.5cm -he follows with Dr. Cyndia Bent with CVTS -continue statin  -BP well controlled -reminded not to do any upper body weight lifting  6.  HLD -LDL goal < 70 -check FLP and ALT -Continue prescription drug management with Atorvastatin 20mg  daily  7.  Tachy/Brady syndrome -he wore a 24 hour Holter showing episodes of PAF up to 164bpm with average HR in sinus at 54bpm.   -he was referred to afib clinic but declined -he is completely asymptomatic with no palpitations and no dizziness (unless pushing his mower up a steep hill) and was able to workout on the bike at the gym for over 30 minutes without any problems this summer but now with limited mobility due to knee and hip problems. HR 51bpm today . Will not change meds at this time and no PPM indicated   Medication Adjustments/Labs and Tests Ordered: Current medicines are reviewed at length with the patient today.  Concerns regarding medicines are outlined above.  Orders Placed This Encounter  Procedures  . EKG 12-Lead   No orders of the defined types were placed in this encounter.   Signed, Fransico Him, MD  12/18/2020 2:37 PM    [place]

## 2020-12-19 LAB — LIPID PANEL
Chol/HDL Ratio: 2.5 ratio (ref 0.0–5.0)
Cholesterol, Total: 126 mg/dL (ref 100–199)
HDL: 51 mg/dL (ref >39)
LDL Chol Calc (NIH): 63 mg/dL (ref 0–99)
Triglycerides: 52 mg/dL (ref 0–149)
VLDL Cholesterol Cal: 12 mg/dL (ref 5–40)

## 2020-12-19 LAB — ALT: ALT: 13 IU/L (ref 0–44)

## 2020-12-19 LAB — TSH: TSH: 0.987 u[IU]/mL (ref 0.450–4.500)

## 2021-01-10 ENCOUNTER — Other Ambulatory Visit: Payer: Self-pay | Admitting: Internal Medicine

## 2021-01-10 ENCOUNTER — Ambulatory Visit
Admission: RE | Admit: 2021-01-10 | Discharge: 2021-01-10 | Disposition: A | Discharge status: Home / Self Care | Payer: Medicare Other | Source: Ambulatory Visit | Attending: Internal Medicine | Admitting: Internal Medicine

## 2021-01-10 DIAGNOSIS — M79644 Pain in right finger(s): Secondary | ICD-10-CM

## 2021-01-19 ENCOUNTER — Other Ambulatory Visit: Payer: Self-pay | Admitting: Cardiology

## 2021-01-21 NOTE — Telephone Encounter (Signed)
Prescription refill request for Eliquis received. Indication: afib  Last office visit:06/26/2020 Scr: 1.01, 08/21/2020 Age:  80 yo  Weight:  70.7 kg   Pt is on the correct dose of Eliquis per dosing criteria. Prescription refill sent for Eliquis 5mg  BID.

## 2021-03-05 ENCOUNTER — Telehealth: Payer: Self-pay | Admitting: Cardiology

## 2021-03-05 NOTE — Telephone Encounter (Signed)
*  STAT* If patient is at the pharmacy, call can be transferred to refill team.   1. Which medications need to be refilled? (please list name of each medication and dose if known) furosemide (LASIX) 20 MG tablet  2. Which pharmacy/location (including street and city if local pharmacy) is medication to be sent to? [id number] [place] [place] [address] 3. Do they need a 30 day or 90 day supply? 90 day

## 2021-03-06 MED ORDER — FUROSEMIDE 20 MG PO TABS: 20.0000 mg | ORAL_TABLET | Freq: Every evening | ORAL | 3 refills | Status: DC

## 2021-03-06 NOTE — Telephone Encounter (Signed)
[facility].

## 2021-03-18 ENCOUNTER — Telehealth: Payer: Self-pay | Admitting: Cardiology

## 2021-03-18 NOTE — Telephone Encounter (Signed)
[name] is calling wanting to know if our office received the results of his lower leg imaging he had done at [facility]. If so he would like to know what Dr. Radford Pax recommends.

## 2021-03-18 NOTE — Telephone Encounter (Signed)
Spoke with the patient and advised him that we did receive the results from his lower extremity US and it was normal. Patient verbalized understanding.

## 2021-04-23 ENCOUNTER — Telehealth: Payer: Self-pay | Admitting: Cardiology

## 2021-04-23 NOTE — Telephone Encounter (Signed)
Spoke with the patient and gave him recommendations from PharmD. Patient verbalized understanding.

## 2021-04-23 NOTE — Telephone Encounter (Signed)
Diclofenc gels is absorbed into the blood stream, but only minimally. It still does have a risk of bleeding (increased when combined with Eliquis) but the risk if much lower than if he were taking diclofenc or other NSAIDs by mouth. If patient is willing to accept a small risk of bleeding, then ok to use. He should use the smallest amount that is effective for the shortest period of time.

## 2021-04-23 NOTE — Telephone Encounter (Signed)
Left message for patient to call back  

## 2021-04-23 NOTE — Telephone Encounter (Signed)
  Pt c/o medication issue:  1. Name of Medication: Diclofenac (topical)  2. How are you currently taking this medication (dosage and times per day)?   3. Are you having a reaction (difficulty breathing--STAT)?   4. What is your medication issue? Pt wanted to ask if he can use this meds while taking eliquis

## 2021-04-23 NOTE — Telephone Encounter (Signed)
Patient was calling back for any update and said he will use the meds on his finger tip. Please advise

## 2021-06-04 ENCOUNTER — Other Ambulatory Visit: Payer: Self-pay | Admitting: *Deleted

## 2021-06-04 DIAGNOSIS — I712 Thoracic aortic aneurysm, without rupture, unspecified: Secondary | ICD-10-CM

## 2021-06-06 ENCOUNTER — Other Ambulatory Visit: Payer: Self-pay | Admitting: Cardiology

## 2021-06-06 NOTE — Telephone Encounter (Signed)
Eliquis 5 mg refill request received. Patient is 80 years old, weight- 70.7 kg, Crea- 1.01 on 08/21/20, Diagnosis-PAF, and last seen by Fransico Him on 12/18/20. Dose is appropriate based on dosing criteria. Will send in refill to requested pharmacy.

## 2021-06-30 ENCOUNTER — Ambulatory Visit: Payer: Medicare Other | Admitting: Cardiology

## 2021-07-18 ENCOUNTER — Other Ambulatory Visit: Payer: Self-pay | Admitting: Thoracic Surgery (Cardiothoracic Vascular Surgery)

## 2021-07-22 ENCOUNTER — Telehealth: Payer: Self-pay | Admitting: Cardiology

## 2021-07-22 NOTE — Telephone Encounter (Signed)
Returned call to Pt.  His new insurance does not cover Eliquis.  He states 2 months worth.  He will continue Eliquis for another month

## 2021-07-22 NOTE — Telephone Encounter (Signed)
Pt c/o medication issue:  1. Name of Medication: ELIQUIS 5 MG TABS tablet  2. How are you currently taking this medication (dosage and times per day)? TAKE 1 TABLET BY MOUTH TWICE A DAY  3. Are you having a reaction (difficulty breathing--STAT)? Possible reaction   4. What is your medication issue? Pt would like to discuss alternatives... please advise

## 2021-07-23 ENCOUNTER — Other Ambulatory Visit: Payer: Self-pay

## 2021-07-23 ENCOUNTER — Ambulatory Visit (INDEPENDENT_AMBULATORY_CARE_PROVIDER_SITE_OTHER): Payer: Medicare HMO | Admitting: Surgery

## 2021-07-23 ENCOUNTER — Encounter: Payer: Self-pay | Admitting: Surgery

## 2021-07-23 ENCOUNTER — Ambulatory Visit
Admission: RE | Admit: 2021-07-23 | Discharge: 2021-07-23 | Disposition: A | Discharge status: Home / Self Care | Payer: Medicare HMO | Source: Ambulatory Visit | Attending: Surgery | Admitting: Surgery

## 2021-07-23 VITALS — BP 160/60 | HR 50 | Resp 20 | Ht 74.0 in | Wt 155.0 lb

## 2021-07-23 DIAGNOSIS — I712 Thoracic aortic aneurysm, without rupture, unspecified: Secondary | ICD-10-CM | POA: Diagnosis not present

## 2021-07-23 DIAGNOSIS — I2510 Atherosclerotic heart disease of native coronary artery without angina pectoris: Secondary | ICD-10-CM | POA: Diagnosis not present

## 2021-07-23 MED ORDER — APIXABAN 5 MG PO TABS: 5.0000 mg | ORAL_TABLET | Freq: Two times a day (BID) | ORAL | 1 refills | Status: DC

## 2021-07-23 MED ORDER — IOPAMIDOL (ISOVUE-300) INJECTION 61%
75.0000 mL | Freq: Once | INTRAVENOUS | Status: AC | PRN
  Administered 2021-07-23: 75 mL via INTRAVENOUS

## 2021-07-23 NOTE — Progress Notes (Signed)
HPI:  The patient is an 81 year old gentleman with history of hypertension, paroxysmal atrial fibrillation, chronic venous insufficiency, coronary artery disease status post stenting in the past, chronic diastolic congestive heart failure, and known ascending aortic aneurysm which has been followed by Dr. Radford Pax since 2019.  CTA of the chest on 05/15/2019 showed a maximum diameter of the ascending thoracic aorta to be 4.4 cm.  He had a repeat CTA of the chest on 05/24/2020 which was read by radiology as having a maximum diameter of 4.7 cm.  I saw him for initial consultation on 06/19/2020 and my review of the scan showed the maximum diameter of the ascending aortic aneurysm was 4.5 cm in the mid ascending aorta.  Since I last saw him he has continued to feel well.  Since I last saw him he had a right total hip replacement on 08/19/2020.   Current Outpatient Medications  Medication Sig Dispense Refill   acetaminophen (TYLENOL) 325 MG tablet Take 650 mg by mouth every 6 (six) hours as needed for moderate pain.     amiodarone (PACERONE) 200 MG tablet Take 1 tablet (200 mg total) by mouth daily. 90 tablet 3   apixaban (ELIQUIS) 5 MG TABS tablet Take 1 tablet (5 mg total) by mouth 2 (two) times daily. 180 tablet 1   ascorbic acid (VITAMIN C) 500 MG tablet Take 500 mg by mouth daily.     Boswellia Serrata (BOSWELLIA PO) Take 1,200 mg by mouth daily.     Coenzyme Q10 (COQ10) 100 MG CAPS Take 100 mg by mouth daily.     furosemide (LASIX) 20 MG tablet Take 1 tablet (20 mg total) by mouth every evening. 90 tablet 3   Misc Natural Products (GLUCOSAMINE CHONDROITIN TRIPLE) TABS Take 1 tablet by mouth daily.     Multiple Vitamin (MULTIVITAMIN WITH MINERALS) TABS tablet Take 1 tablet by mouth daily.     Omega-3 Fatty Acids (FISH OIL) 1200 MG CAPS Take 1,200 mg by mouth daily.     pindolol (VISKEN) 10 MG tablet Take 0.5 tablets (5 mg total) by mouth 2 (two) times daily. 90 tablet 3   traMADol (ULTRAM) 50 MG  tablet Take 1-2 tablets (50-100 mg total) by mouth every 6 (six) hours as needed for moderate pain. 40 tablet 0   Zinc 50 MG CAPS Take 100 mg by mouth 3 (three) times a week.      atorvastatin (LIPITOR) 20 MG tablet Take 1 tablet (20 mg total) by mouth daily. (Patient not taking: Reported on 07/23/2021) 90 tablet 3   No current facility-administered medications for this visit.     Physical Exam: BP (!) 160/60    Pulse (!) 50    Resp 20    Ht 6\' 2"  (1.88 m)    Wt 155 lb (70.3 kg)    SpO2 97% Comment: RA   BMI 19.90 kg/m  He looks well. Cardiac exam shows a regular rate and rhythm with normal heart sounds.  There is no murmur. Lungs are clear.  Diagnostic Tests:  Narrative & Impression  CLINICAL DATA:  Thoracic aortic aneurysm follow-up.   EXAM: CT ANGIOGRAPHY CHEST WITH CONTRAST   TECHNIQUE: Multidetector CT imaging of the chest was performed using the standard protocol during bolus administration of intravenous contrast. Multiplanar CT image reconstructions and MIPs were obtained to evaluate the vascular anatomy.   CONTRAST:  78mL ISOVUE-300 IOPAMIDOL (ISOVUE-300) INJECTION 61%   COMPARISON:  CT examination dated May 24, 2020   FINDINGS:  Cardiovascular: Preferential opacification of the thoracic aorta. Stable appearance of the fusiform aneurysmal dilation of the ascending thoracic aorta measuring up to 4.5 cm. No evidence of acute dissection, mediastinal hemorrhage or hematoma. Stable cardiomegaly and coronary artery atherosclerotic disease. No pericardial effusion. Descending thoracic aorta is normal in caliber.   Mediastinum/Nodes: No enlarged mediastinal, hilar, or axillary lymph nodes. Thyroid gland, trachea, and esophagus demonstrate no significant findings.   Lungs/Pleura: Left basilar subsegmental linear atelectasis. Lungs otherwise clear. No pleural effusion or pneumothorax.   Upper Abdomen: No acute abnormality. Stable 3 cm right adrenal nodule. Left renal  cortical atrophy and multiple cysts are unchanged.   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 Degenerative disease. No suspicious osseous lesion.   Review of the MIP images confirms the above findings.   IMPRESSION: 1. Stable fusiform aneurysmal dilatation of the ascending thoracic aorta measuring up to 4.5 cm. No evidence of aortic dissection or acute vascular abnormality. 2. Stable cardiomegaly and coronary artery atherosclerotic disease. 3. 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 No acute osseous abnormality.     Electronically Signed   By: Keane Police D.O.   On: 07/23/2021 12:56      Impression:  He has a stable 4.5 cm fusiform ascending aortic aneurysm.  This is well below the surgical threshold of 5.5 cm.  I reviewed the CTA images with him and answered all of his questions.  I stressed the importance of continued good blood pressure control in preventing further enlargement and acute aortic dissection.  I advised him against doing any heavy lifting that may require a Valsalva maneuver that could suddenly raise his blood pressure to high levels.  I also advised him against taking quinolone antibiotics that have been associated with enlargement of aneurysms.  Plan:  I will plan to see him back in 1 year with a CTA of the chest.  I spent 20 minutes performing this established patient evaluation and > 50% of this time was spent face to face counseling and coordinating the care of this patient's aortic aneurysm.    Gaye Pollack, MD [facility] [phone]

## 2021-07-23 NOTE — Telephone Encounter (Signed)
Prescription refill request for Eliquis received. Indication: Afib  Last office visit: 12/18/20 Radford Pax)  Scr: 1.01 (08/21/20)  Age: 81 Weight: 70.7kg  Appropriate dose and refill sent to requested pharmacy.

## 2021-08-19 DIAGNOSIS — Z85828 Personal history of other malignant neoplasm of skin: Secondary | ICD-10-CM | POA: Diagnosis not present

## 2021-08-19 DIAGNOSIS — C44329 Squamous cell carcinoma of skin of other parts of face: Secondary | ICD-10-CM | POA: Diagnosis not present

## 2021-09-01 ENCOUNTER — Telehealth: Payer: Self-pay | Admitting: Cardiology

## 2021-09-01 NOTE — Telephone Encounter (Signed)
°  Pt c/o medication issue:  1. Name of Medication:   apixaban (ELIQUIS) 5 MG TABS tablet  2. How are you currently taking this medication (dosage and times per day)?   As directed  3. Are you having a reaction (difficulty breathing--STAT)?    Says yes but will not elaborate  4. What is your medication issue?    Patient states he is going to stop taking medication unless he can speak to the nurse regarding his concerns

## 2021-09-01 NOTE — Telephone Encounter (Signed)
Pt aware why he is on Eliquis and why he is on dose he is on. Pt does not want to remain on the medicine if at all possible -- he doesnt like the SE like bruising every time he hits something. Educated. Advised to further discuss with Turner at his OV in several weeks. Patient verbalized understanding and agreeable to plan.

## 2021-09-10 ENCOUNTER — Telehealth: Payer: Self-pay

## 2021-09-10 MED ORDER — CARVEDILOL 6.25 MG PO TABS: 6.2500 mg | ORAL_TABLET | Freq: Two times a day (BID) | ORAL | 3 refills | Status: DC

## 2021-09-10 NOTE — Telephone Encounter (Signed)
Left message for patient to call back  

## 2021-09-10 NOTE — Telephone Encounter (Signed)
Spoke to the patient and he is in agreement to try carvedilol 6.25 mg twice daily instead of pindolol. Rx has been sent in.

## 2021-09-10 NOTE — Telephone Encounter (Signed)
Spoke with the patient who states that his pindolol is going to cost him $140 because his insurance no longer covers it. He is wondering if he could be switched to something else.

## 2021-09-10 NOTE — Telephone Encounter (Signed)
Lets try changing to carvedilol 6.25mg  BID

## 2021-09-10 NOTE — Telephone Encounter (Signed)
Patient called in and stated his insurance no longer covers his pindolol and they will cover metoprolol. Patient is also open to suggestions. He would like call back to discuss. Phone note listed in chart.

## 2021-09-11 ENCOUNTER — Telehealth: Payer: Self-pay | Admitting: Cardiology

## 2021-09-11 NOTE — Telephone Encounter (Signed)
Called pt reviewed medication change with pt as was told to pt by [facility], RN yesterday.  Advised pt [facility] [address].  Spelled out name and dose of medication for pt to write down.  All questions answered.

## 2021-09-11 NOTE — Telephone Encounter (Signed)
New Message:     Patient said he talked to [facility] yesterday. He wants to ask her what medicine did they decide on?

## 2021-09-22 ENCOUNTER — Ambulatory Visit: Payer: Medicare HMO | Admitting: Cardiology

## 2021-09-29 ENCOUNTER — Telehealth: Payer: Self-pay | Admitting: Cardiology

## 2021-09-29 NOTE — Telephone Encounter (Signed)
Spoke with the patient who has questions about his medications. I went through his list and confirmed he is taking his medications as prescribed. He is wondering if any of his medications could be causing shortness of breath. He states that he does not have shortness of breath at rest or when he rides a stationary bike at the gym. However when he walks distances and up hills he does get some shortness of breath. Denies chest pain or swelling. He states symptoms have been present for the past year and have not worsening. Patient has a follow up with Richardson Dopp, PA-C and will discuss at that time.  ?

## 2021-09-29 NOTE — Telephone Encounter (Signed)
Patient only wanted to talk to Dr. Theodosia Blender RN [facility] about his medication. He did not want to send a MyChart message  ?

## 2021-10-01 DIAGNOSIS — M5451 Vertebrogenic low back pain: Secondary | ICD-10-CM | POA: Diagnosis not present

## 2021-10-03 ENCOUNTER — Telehealth: Payer: Self-pay | Admitting: Cardiology

## 2021-10-03 NOTE — Telephone Encounter (Signed)
Left message for patient to call back  

## 2021-10-03 NOTE — Telephone Encounter (Signed)
Pt c/o medication issue: ? ?1. Name of Medication: Prednisone 5 MG's ? ?2. How are you currently taking this medication (dosage and times per day)? Not currently taking  ? ?3. Are you having a reaction (difficulty breathing--STAT)? No  ? ?4. What is your medication issue? Patient is calling stating he was prescribed this medication and he is wanting to confirm it won't interact with any of his current meds and is okay to be on with his heart condition. It is prescribed for him to take two tablets before breakfast, 1 tablet at lunch, 1 at dinner and two at bedtime for the first day. The 2nd day he is to take 1 before breakfast, 1 tablets at lunch, 1 at dinner and 1 at bedtime. On his 3rd day 1 tablet before breakfast, 1 at lunch, 1 at dinner, and 1 at bedtime. The 4th day 1 at breakfast, 1 at lunch, then one at bedtime . On the 5th 1 at breakfast & one at bedtime. Lastly the 6th day 1 tablet before breakfast then done. ?

## 2021-10-03 NOTE — Telephone Encounter (Signed)
Pt advised the information for our PharmD and will reach back out to his Orthopaedic MD for alternate med options.  ?

## 2021-10-03 NOTE — Telephone Encounter (Signed)
Patient returned call

## 2021-10-03 NOTE — Telephone Encounter (Signed)
Patient prescribed prednisone for lumbar pain.  Recommend he only take course if truly needed as it can affect the metabolism of his amidoarone and increase risk of bleeding with Eliquis ?

## 2021-10-06 ENCOUNTER — Telehealth: Payer: Self-pay | Admitting: Cardiology

## 2021-10-06 NOTE — Telephone Encounter (Signed)
Pt c/o medication issue: ? ?1. Name of Medication: Prednisone ? ?2. How are you currently taking this medication (dosage and times per day)? Not taking ? ?3. Are you having a reaction (difficulty breathing--STAT)? no ? ?4. What is your medication issue? Patient calling to speak with Carly. States he was told he cannot take Prednisone for his back but wants to know what other alternatives he could take. Please advise.   ?

## 2021-10-06 NOTE — Telephone Encounter (Signed)
Spoke with pt and advised per recommendation from PharmD last week: ? ?Patient prescribed prednisone for lumbar pain.  Recommend he only take course if truly needed as it can affect the metabolism of his amidoarone and increase risk of bleeding with Eliquis ? ?Reviewed recommendation from PharmD with pt.  Pt states he has spoken with his Ortho who says he has never heard of Prednisone interaction with Amiodarone and Eliquis.  Pt states Ortho has also offered spinal injections for the pain if he decides not to take the Prednisone.  Pt will make a decision between the two going forward and thanked RN for the call. ?

## 2021-10-08 ENCOUNTER — Telehealth: Payer: Self-pay | Admitting: Cardiology

## 2021-10-08 NOTE — Telephone Encounter (Signed)
New Message: ? ? ? ?Patient would like for [name] to give him a call please. He said it is concerning , what can he take in the place of the Steroid tablets? ?

## 2021-10-08 NOTE — Telephone Encounter (Signed)
Spoke with the patient and advised that he follow up with his orthopedist for other options. He states that he is waiting to hear back from them about injections. Advised that if he has concerns about the injection that his orthopedist recommends then to let us know. Patient verbalized understanding.  ?

## 2021-10-13 DIAGNOSIS — Z85828 Personal history of other malignant neoplasm of skin: Secondary | ICD-10-CM | POA: Diagnosis not present

## 2021-10-13 DIAGNOSIS — L308 Other specified dermatitis: Secondary | ICD-10-CM | POA: Diagnosis not present

## 2021-10-13 DIAGNOSIS — D485 Neoplasm of uncertain behavior of skin: Secondary | ICD-10-CM | POA: Diagnosis not present

## 2021-10-13 DIAGNOSIS — L570 Actinic keratosis: Secondary | ICD-10-CM | POA: Diagnosis not present

## 2021-10-16 NOTE — Progress Notes (Signed)
?Cardiology Office Note:   ? ?Date:  10/17/2021  ? ?ID:  [name], DOB 24-Jul-1940, MRN [id number] ? ?PCP:  Seward Carol, MD  ?[facility] Providers ?Cardiologist:  Fransico Him, MD    ?Referring MD: Seward Carol, MD  ? ?Chief Complaint:  Follow-up for CAD, CHF, A-fib ?  ? 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 Cyndia Bent) ? ?Prior CV Studies: ?Chest/aorta CTA 07/23/2021 ?Ascending thoracic aorta 4.5 cm ? ?Myoview 07/22/2020 ?EF 64 apical inferior and apical infarct with peri-infarct ischemia; low risk (no change from 2010) ? ?Echocardiogram 07/17/2020 ?EF 60-65, no RWMA, GR 1 DD, normal RVSF, mild BAE, mild MR, trivial AI, mild AV sclerosis without stenosis, SoV borderline dilation (41 mm) ? ?Monitor 05/08/2019 ?Sinus bradycardia (average heart rate 55); A-fib with RVR with heart rate up to 164; occasional PVCs and one 5 beat run of WCT ? ?Cardiac catheterization 06/04/2006 ?LM patent ?LAD 30-40 ostial, 20-30 proximal, 90 distal (small) ?LCx patent ?RCA mid to distal 90 ?EF 50 ?PCI: 3.5 x 16 mm Liberte BMS and 3.5 x 12 mm Liberte BMS to the RCA ?  ? ?History of Present Illness:   ?[name] is a 81 y.o. male with the above problem list.  He was last seen by Dr. Radford Pax in June 22.  He returns for follow-up.  Over the last several weeks, he has noted that his heart rate is slower and he is somewhat dizzy.  He is able to exercise at the gym without difficulty.  However, at other times, he is short of breath with activities.  He has not had orthopnea.  He has chronic lower extremity edema related to venous insufficiency.  He has not had syncope, near syncope. ?    ?Past Medical History:  ?Diagnosis Date  ? Ascending aorta dilatation (HCC)   ? 27m by chest CT angio 04/2018  ? CAD (coronary artery disease)   ? a. 05/2006 Abnl stress test w/ 2-333mST dep; b. 05/2006 Cath/PCI: LM nl, LAD 30-40ost, 20-30p, 90d (small), D1 nl, D2 nl, LCX nl, OM1/2 nl, RCA Ca2+, 9056m(3.5x16 Liberty BMS & 3.5x12 Liberty BMS), EF 50%.  ? Chronic diastolic CHF (congestive heart failure) ([place] ? a. 10/2017 Echo: EF 50-55%, mild AI, sev dil LA, mod dil RA.  ? Chronic venous insufficiency   ? HTN (hypertension)   ? [place] ([place]ox[place]mal [place] fibrillation) [place] ? a. CHA2DS2VASc 5-->Eliquis; b. Prev WCT on flecainide;  c. 12/2017 s/p DCCV.  ? Pneumothorax   ? Ventricular tachycardia   ? a. possibly proarrhythmia from flecainide.  ? ?Current Medications: ?Current Meds  ?Medication Sig  ? acetaminophen (TYLENOL) 325 MG tablet Take 650 mg by mouth every 6 (six) hours as needed for moderate pain.  ? amiodarone (PACERONE) 200 MG tablet Take 1 tablet (200 mg total) by mouth daily.  ? apixaban (ELIQUIS) 5 MG TABS tablet Take 1 tablet (5 mg total) by mouth 2 (two) times daily.  ? ascorbic acid (VITAMIN C) 500 MG tablet Take 500 mg by mouth daily.  ? atorvastatin (LIPITOR) 20 MG tablet Take 1 tablet (20 mg total) by mouth daily.  ? Boswellia Serrata (BOSWELLIA PO) Take 1,200 mg by mouth daily.  ?  carvedilol (COREG) 3.125 MG tablet Take 1 tablet (3.125 mg total) by mouth 2 (two) times daily with a meal.  ? Coenzyme Q10 (COQ10) 100 MG CAPS Take 100 mg by mouth daily.  ? furosemide (LASIX) 20 MG tablet Take 1 tablet (20 mg total) by mouth every evening.  ? lidocaine (LIDODERM) 5 % Place 1 patch onto the skin daily.  ? Misc Natural Products (GLUCOSAMINE CHONDROITIN TRIPLE) TABS Take 1 tablet by mouth daily.  ? Multiple Vitamin (MULTIVITAMIN WITH MINERALS) TABS tablet Take 1 tablet by mouth daily.  ? Omega-3 Fatty Acids (FISH OIL) 1200 MG CAPS Take 1,200 mg by mouth daily.  ? traMADol (ULTRAM) 50 MG tablet Take 1-2  tablets (50-100 mg total) by mouth every 6 (six) hours as needed for moderate pain.  ? Zinc 50 MG CAPS Take 100 mg by mouth 3 (three) times a week.   ? [DISCONTINUED] carvedilol (COREG) 6.25 MG tablet Take 1 tablet (6.25 mg total) by mouth 2 (two) times daily.  ?  ?Allergies:   Patient has no known allergies.  ? ?Social History  ? ?Tobacco Use  ? Smoking status: Never  ? Smokeless tobacco: Never  ?Vaping Use  ? Vaping Use: Never used  ?Substance Use Topics  ? Alcohol use: No  ?  Comment: occsaoinal  ? Drug use: Yes  ?  Types: Other-see comments  ?  ?Family Hx: ?The patient's family history includes Coronary artery disease in an other family member. ? ?Review of Systems  ?Hematologic/Lymphatic: Bruises/bleeds easily.  ?Gastrointestinal:  Negative for hematochezia.  ?Genitourinary:  Negative for hematuria.   ? ?EKGs/Labs/Other Test Reviewed:   ? ?EKG:  EKG is  ordered today.  The ekg ordered today demonstrates sinus bradycardia, HR 43, PACs ? ?Recent Labs: ?12/18/2020: ALT 13; TSH 0.987  ? ?Recent Lipid Panel ?Recent Labs  ?  12/18/20 ?1457  ?CHOL 126  ?TRIG 52  ?HDL 51  ?[place] 63  ?  ? ?Risk Assessment/Calculations:   ? ?CHA2DS2-VASc Score = 5  ? This indicates a 7.2% annual risk of stroke. ?The patient's score is based upon: ?CHF History: 1 ?HTN History: 1 ?Diabetes History: 0 ?Stroke History: 0 ?Vascular Disease History: 1 ?Age Score: 2 ?Gender Score: 0 ?  ? ?    ?Physical Exam:   ? ?VS:  BP (!) 118/50 (BP Location: Right Arm, Patient Position: Sitting, Cuff Size: Normal)   Pulse (!) 43   Ht '6\' 2"'$  (1.88 m)   Wt 156 lb 12.8 oz (71.1 kg)   SpO2 98%   BMI 20.13 kg/m?    ? ?Wt Readings from Last 3 Encounters:  ?10/17/21 156 lb 12.8 oz (71.1 kg)  ?07/23/21 155 lb (70.3 kg)  ?12/18/20 155 lb 12.8 oz (70.7 kg)  ?  ?Constitutional:   ?   Appearance: Healthy appearance. Not in distress.  ?Neck:  ?   Vascular: No JVR. JVD normal.  ?Pulmonary:  ?   Effort: Pulmonary effort is normal.  ?   Breath sounds: No wheezing. No  rales.  ?Cardiovascular:  ?   Normal rate. Irregular rhythm. Normal S1. Normal S2.   ?   Murmurs: There is a grade 1/6 systolic murmur at the URSB. There is a grade 1/4 diastolic murmur at the LLSB.  ?Edema: ?   Peripheral edema present. ?   Ankle: 1+ edema of the left ankle. ?Abdominal:  ?   Palpations: Abdomen is soft.  ?Skin: ?   General: Skin is warm and dry.  ?Neurological:  ?  General: No focal deficit present.  ?   Mental Status: Alert and oriented to person, place and time.  ?   Cranial Nerves: Cranial nerves are intact.  ?  ?    ?ASSESSMENT & PLAN:   ?Tachycardia-bradycardia syndrome ([place]) ?He has had issues with bradycardia in the past but has been asymptomatic.  His heart rate today on electrocardiogram is much slower.  He is now somewhat symptomatic with dizziness.  He has not had syncope.  I also reviewed his EKG with Dr. Radford Pax.  I will decrease his carvedilol to 3.125 mg twice daily.  I will have him return in 2 weeks for an EKG.  If his heart rate remains slow and/or he remains symptomatic, I will stop his carvedilol.  If he remains symptomatic/bradycardic on amiodarone alone, we may need to proceed with a follow-up monitor or referral to EP.  I will have him return to see Dr. Radford Pax or me in the next 2 months. ? ?(HFpEF) heart failure with preserved ejection fraction ([place]) ?EF normal by echocardiogram in 2021 as well as on nuclear study in 2022.  Volume status is currently stable.  He has some shortness of breath from time to time that I think is likely related to his bradycardia.  Continue Lasix 20 mg daily. ? ?Chronic venous insufficiency ?He has some edema in his left ankle that improves with elevation.  I encouraged him to use compression stockings. ? ?Coronary artery disease involving native coronary artery of native heart without angina pectoris ?Status post bare-metal stenting to the RCA in 2007.  Nuclear stress test in January 2022 was low risk.  He is not having anginal symptoms.  Continue  Lipitor 20 mg daily.  He is not on aspirin as he is on Eliquis.  Reduce dose of Coreg as outlined. ? ?Essential hypertension ?Blood pressure is well controlled.  If his blood pressure increases off of beta-b

## 2021-10-17 ENCOUNTER — Telehealth: Payer: Self-pay | Admitting: Physician Assistant

## 2021-10-17 ENCOUNTER — Encounter: Payer: Self-pay | Admitting: Physician Assistant

## 2021-10-17 ENCOUNTER — Ambulatory Visit: Payer: Medicare HMO | Admitting: Physician Assistant

## 2021-10-17 VITALS — BP 118/50 | HR 43 | Ht 74.0 in | Wt 156.8 lb

## 2021-10-17 DIAGNOSIS — E785 Hyperlipidemia, unspecified: Secondary | ICD-10-CM

## 2021-10-17 DIAGNOSIS — I7121 Aneurysm of the ascending aorta, without rupture: Secondary | ICD-10-CM | POA: Diagnosis not present

## 2021-10-17 DIAGNOSIS — I5032 Chronic diastolic (congestive) heart failure: Secondary | ICD-10-CM

## 2021-10-17 DIAGNOSIS — I4819 Other persistent atrial fibrillation: Secondary | ICD-10-CM | POA: Diagnosis not present

## 2021-10-17 DIAGNOSIS — I2510 Atherosclerotic heart disease of native coronary artery without angina pectoris: Secondary | ICD-10-CM

## 2021-10-17 DIAGNOSIS — I495 Sick sinus syndrome: Secondary | ICD-10-CM | POA: Insufficient documentation

## 2021-10-17 DIAGNOSIS — I872 Venous insufficiency (chronic) (peripheral): Secondary | ICD-10-CM | POA: Diagnosis not present

## 2021-10-17 DIAGNOSIS — I10 Essential (primary) hypertension: Secondary | ICD-10-CM

## 2021-10-17 LAB — COMPREHENSIVE METABOLIC PANEL
ALT: 15 IU/L (ref 0–44)
AST: 20 IU/L (ref 0–40)
Albumin/Globulin Ratio: 1.8 (ref 1.2–2.2)
Albumin: 4 g/dL (ref 3.6–4.6)
Alkaline Phosphatase: 88 IU/L (ref 44–121)
BUN/Creatinine Ratio: 9 — ABNORMAL LOW (ref 10–24)
BUN: 11 mg/dL (ref 8–27)
Bilirubin Total: 0.4 mg/dL (ref 0.0–1.2)
CO2: 29 mmol/L (ref 20–29)
Calcium: 9.3 mg/dL (ref 8.6–10.2)
Chloride: 101 mmol/L (ref 96–106)
Creatinine, Ser: 1.26 mg/dL (ref 0.76–1.27)
Globulin, Total: 2.2 g/dL (ref 1.5–4.5)
Glucose: 92 mg/dL (ref 70–99)
Potassium: 4.1 mmol/L (ref 3.5–5.2)
Sodium: 140 mmol/L (ref 134–144)
Total Protein: 6.2 g/dL (ref 6.0–8.5)
eGFR: 57 mL/min/{1.73_m2} — ABNORMAL LOW (ref >59)

## 2021-10-17 LAB — CBC
Hematocrit: 31.8 % — ABNORMAL LOW (ref 37.5–51.0)
Hemoglobin: 9.8 g/dL — ABNORMAL LOW (ref 13.0–17.7)
MCH: 23.1 pg — ABNORMAL LOW (ref 26.6–33.0)
MCHC: 30.8 g/dL — ABNORMAL LOW (ref 31.5–35.7)
MCV: 75 fL — ABNORMAL LOW (ref 79–97)
Platelets: 213 10*3/uL (ref 150–450)
RBC: 4.24 x10E6/uL (ref 4.14–5.80)
RDW: 17.2 % — ABNORMAL HIGH (ref 11.6–15.4)
WBC: 5 10*3/uL (ref 3.4–10.8)

## 2021-10-17 LAB — LIPID PANEL
Chol/HDL Ratio: 2.4 ratio (ref 0.0–5.0)
Cholesterol, Total: 115 mg/dL (ref 100–199)
HDL: 47 mg/dL (ref >39)
LDL Chol Calc (NIH): 57 mg/dL (ref 0–99)
Triglycerides: 47 mg/dL (ref 0–149)
VLDL Cholesterol Cal: 11 mg/dL (ref 5–40)

## 2021-10-17 LAB — TSH: TSH: 1.63 u[IU]/mL (ref 0.450–4.500)

## 2021-10-17 MED ORDER — CARVEDILOL 3.125 MG PO TABS: 3.1250 mg | ORAL_TABLET | Freq: Two times a day (BID) | ORAL | 1 refills | Status: DC

## 2021-10-17 NOTE — Telephone Encounter (Signed)
Spoke with the patient who states that he was told to decrease his carvedilol to 3.125 mg twice daily. He reports that he was prescribed carvedilol 6.25 mg twice daily however he has been cutting those in half so has been taking 3.125 twice daily. He states that his symptoms of dizziness started after he was switched to carvedilol. He is wondering if he can go back on pindolol. He was switched because his insurance did not cover pindolol but he states that he would be willing to switch back and pay out of pocket.  ?

## 2021-10-17 NOTE — Telephone Encounter (Signed)
Since he is on Coreg 3.125 mg twice daily, let?s discontinue it. Let?s hold off on resuming pindolol until we get an EKG in two weeks at his nurse visit. He may not need pindolol. If his heart rate or blood pressure is increased at his nurse visit, we can try low-dose pindolol again. ?Richardson Dopp, [place] ? C ?10/17/2021 17:15

## 2021-10-17 NOTE — Assessment & Plan Note (Signed)
He has some edema in his left ankle that improves with elevation.  I encouraged him to use compression stockings. ?

## 2021-10-17 NOTE — Assessment & Plan Note (Signed)
Maintaining sinus rhythm.  He is bradycardic as noted with some symptoms.  Adjust carvedilol as noted.  Continue amiodarone 200 mg daily.  Obtain follow-up TSH, CMET, CBC.  I have encouraged him to get eye exams yearly.  He has serial CT scans with Dr. Cyndia Bent and recently had a follow-up in January 2023. ?

## 2021-10-17 NOTE — Telephone Encounter (Signed)
Pt c/o medication issue: ? ?1. Name of Medication: carvedilol (COREG) 3.125 MG tablet ? ?2. How are you currently taking this medication (dosage and times per day)? Take 1 tablet (3.125 mg total) by mouth 2 (two) times daily with a meal. ? ?3. Are you having a reaction (difficulty breathing--STAT)?  ? ?4. What is your medication issue? Patient has question about the medication, he wants to know more about the product itself. He wants to know more about the side effects.   ?

## 2021-10-17 NOTE — Assessment & Plan Note (Signed)
Follow-up with Dr. Cyndia Bent.  CT in January 2023 measures 4.5 cm. ?

## 2021-10-17 NOTE — Assessment & Plan Note (Signed)
He has had issues with bradycardia in the past but has been asymptomatic.  His heart rate today on electrocardiogram is much slower.  He is now somewhat symptomatic with dizziness.  He has not had syncope.  I also reviewed his EKG with Dr. Radford Pax.  I will decrease his carvedilol to 3.125 mg twice daily.  I will have him return in 2 weeks for an EKG.  If his heart rate remains slow and/or he remains symptomatic, I will stop his carvedilol.  If he remains symptomatic/bradycardic on amiodarone alone, we may need to proceed with a follow-up monitor or referral to EP.  I will have him return to see Dr. Radford Pax or me in the next 2 months. ?

## 2021-10-17 NOTE — Telephone Encounter (Signed)
Spoke with the patient and advised him on recommendations from [place]. Patient verbalized understanding.  ?

## 2021-10-17 NOTE — Assessment & Plan Note (Signed)
EF normal by echocardiogram in 2021 as well as on nuclear study in 2022.  Volume status is currently stable.  He has some shortness of breath from time to time that I think is likely related to his bradycardia.  Continue Lasix 20 mg daily. ?

## 2021-10-17 NOTE — Assessment & Plan Note (Signed)
Blood pressure is well controlled.  If his blood pressure increases off of beta-blocker therapy, we will need to adjust his medical therapy.  Consider ACE/ARB or amlodipine. ?

## 2021-10-17 NOTE — Assessment & Plan Note (Signed)
Status post bare-metal stenting to the RCA in 2007.  Nuclear stress test in January 2022 was low risk.  He is not having anginal symptoms.  Continue Lipitor 20 mg daily.  He is not on aspirin as he is on Eliquis.  Reduce dose of Coreg as outlined. ?

## 2021-10-17 NOTE — Assessment & Plan Note (Signed)
Continue Lipitor 20 mg daily.  Obtain fasting CMET, lipids today. ?

## 2021-10-17 NOTE — Patient Instructions (Addendum)
Medication Instructions:  ?Your physician has recommended you make the following change in your medication:  ? DECREASE the Carvedilol to 3.125 mg taking 1 twice a day.  You can use the 6.125 tablets by taking 1/2 of them twice a day  ? ?*If you need a refill on your cardiac medications before your next appointment, please call your pharmacy* ? ? ?Lab Work: ?TODAY:  CMET, LIPID, CBC, & TSH ? ?If you have labs (blood work) drawn today and your tests are completely normal, you will receive your results only by: ?MyChart Message (if you have MyChart) OR ?A paper copy in the mail ?If you have any lab test that is abnormal or we need to change your treatment, we will call you to review the results. ? ? ?Testing/Procedures: ?None ordered ? ? ?Follow-Up: ?At [facility], you and your health needs are our priority.  As part of our continuing mission to provide you with exceptional heart care, we have created designated Provider Care Teams.  These Care Teams include your primary Cardiologist (physician) and Advanced Practice Providers (APPs -  Physician Assistants and Nurse Practitioners) who all work together to provide you with the care you need, when you need it. ? ?We recommend signing up for the patient portal called "MyChart".  Sign up information is provided on this After Visit Summary.  MyChart is used to connect with patients for Virtual Visits (Telemedicine).  Patients are able to view lab/test results, encounter notes, upcoming appointments, etc.  Non-urgent messages can be sent to your provider as well.   ?To learn more about what you can do with MyChart, go to [email or web address].   ? ?Your next appointment:   ?2 month(s) ? ?The format for your next appointment:   ?In Person ? ?Provider:   ?Fransico Him, MD  or Richardson Dopp, PA-C       ? ? ?Other Instructions ? ?

## 2021-10-20 ENCOUNTER — Telehealth: Payer: Self-pay | Admitting: Cardiology

## 2021-10-20 NOTE — Telephone Encounter (Signed)
Spoke with the patient who wanted to know why he is coming in for a nurse visit in two weeks. Advised patient that this was to obtain an EKG after stopping his carvedilol and to see how he is feeling. Patient verbalized understanding. ?

## 2021-10-20 NOTE — Telephone Encounter (Signed)
Patient would like for nurse to give him a call. Wouldn't go into further detail.  ?

## 2021-10-20 NOTE — Telephone Encounter (Signed)
Left message for patient to call back  

## 2021-10-21 DIAGNOSIS — B9689 Other specified bacterial agents as the cause of diseases classified elsewhere: Secondary | ICD-10-CM | POA: Diagnosis not present

## 2021-10-21 DIAGNOSIS — L0291 Cutaneous abscess, unspecified: Secondary | ICD-10-CM | POA: Diagnosis not present

## 2021-10-21 DIAGNOSIS — Z85828 Personal history of other malignant neoplasm of skin: Secondary | ICD-10-CM | POA: Diagnosis not present

## 2021-10-27 NOTE — Telephone Encounter (Signed)
FYI--Patient called in to cancel 4/14 nurse visit due to having to pay additional copay for EKG. He statres he does not want to reschedule at this time. Will follow up with Richardson Dopp, PA on 5/31 and would prefer to have EKG at that time if possible. ?

## 2021-10-28 DIAGNOSIS — M5451 Vertebrogenic low back pain: Secondary | ICD-10-CM | POA: Diagnosis not present

## 2021-10-28 NOTE — Telephone Encounter (Signed)
If he is feeling ok, he can wait until his OV. ?If he is weak, dizzy, near syncopal, we should get the EKG sooner or send him a Zio to wear for 3 days. ?Richardson Dopp, PA-C    ?10/28/2021 11:04 PM   ?

## 2021-10-29 NOTE — Telephone Encounter (Signed)
See MyChart messages.

## 2021-10-31 ENCOUNTER — Ambulatory Visit: Payer: Medicare HMO

## 2021-11-04 DIAGNOSIS — M5136 Other intervertebral disc degeneration, lumbar region: Secondary | ICD-10-CM | POA: Diagnosis not present

## 2021-11-04 DIAGNOSIS — M5451 Vertebrogenic low back pain: Secondary | ICD-10-CM | POA: Diagnosis not present

## 2021-11-07 ENCOUNTER — Telehealth: Payer: Self-pay | Admitting: Cardiology

## 2021-11-07 NOTE — Telephone Encounter (Signed)
Attempted to contact pt - NA and no VM ?

## 2021-11-07 NOTE — Telephone Encounter (Signed)
Left a message to call back.

## 2021-11-07 NOTE — Telephone Encounter (Signed)
Patient is returning call.  °

## 2021-11-07 NOTE — Telephone Encounter (Signed)
Pt c/o medication issue: ? ?1. Name of Medication:  ?Prednisone ? ?2. How are you currently taking this medication (dosage and times per day)?  ? ?3. Are you having a reaction (difficulty breathing--STAT)?  ? ?4. What is your medication issue?  ? ?Patient is requesting to speak with Bethann Berkshire, RN. He would like to discuss going on Prednisone for 5 days. ?

## 2021-11-10 NOTE — Telephone Encounter (Signed)
Patient calling back for update, bout the prednisone his dr want him to take. Please advise  ?

## 2021-11-10 NOTE — Telephone Encounter (Signed)
Spoke with the patient who has previously calling in March with the same question.  ?Advised him again on information from PharmD ?Rollen Sox, [facility] ?Patient prescribed prednisone for lumbar pain.  Recommend he only take course if truly needed as it can affect the metabolism of his amidoarone and increase risk of bleeding with Eliquis  ?  ? ?Patient verbalized understanding. States that he really needs to take it.  ? ?

## 2021-11-11 DIAGNOSIS — M5451 Vertebrogenic low back pain: Secondary | ICD-10-CM | POA: Diagnosis not present

## 2021-11-13 DIAGNOSIS — M5451 Vertebrogenic low back pain: Secondary | ICD-10-CM | POA: Diagnosis not present

## 2021-11-14 ENCOUNTER — Telehealth: Payer: Self-pay | Admitting: Cardiology

## 2021-11-14 NOTE — Telephone Encounter (Signed)
Pt states that he has been experiencing dizziness but is not quite sure from which medication. Pt would like a call back. Please advise ?

## 2021-11-14 NOTE — Telephone Encounter (Signed)
Spoke with the patient who states that he has been having some dizziness. He reports that he thinks its caused by his Eliquis or his amiodarone that he takes in the morning. He states that he gets dizzy when he is moving around. He states it is not all the time and he never feels as though he is going to pass out. He just feels off balance. He goes to the gym almost daily and is not symptomatic while he is working out. Discussed his water intake and encouraged him to increase fluids as he is probably not drinking enough especially if he is exercising. He does not have any BP or HR readings when he has been symptomatic. He does states that his BP is usually 140s/70s and HR 40-50s.  ?

## 2021-11-17 ENCOUNTER — Other Ambulatory Visit: Payer: Self-pay | Admitting: Cardiology

## 2021-11-18 NOTE — Telephone Encounter (Signed)
Spoke with the patient and gave him recommendations from Dr. Turner. Patient verbalized understanding 

## 2021-11-20 DIAGNOSIS — M5451 Vertebrogenic low back pain: Secondary | ICD-10-CM | POA: Diagnosis not present

## 2021-11-21 ENCOUNTER — Telehealth: Payer: Self-pay | Admitting: Cardiology

## 2021-11-21 NOTE — Telephone Encounter (Signed)
Spoke with the patient who states that he is still having dizziness and is concerned that it is caused by Eliquis. I have advised him per previous conversation that Dr. Radford Pax does not believe this would be caused by his Eliquis. He has not followed up with his PCP yet and I advised him to do so. He does not take his BP/HR daily which I have advised him to start doing. Advised to take when he is symptomatic as well. He verbalized understanding.  ?

## 2021-11-21 NOTE — Telephone Encounter (Signed)
Left message for patient to call back  

## 2021-11-21 NOTE — Telephone Encounter (Signed)
Pt c/o medication issue: ? ?1. Name of Medication:  ?apixaban (ELIQUIS) 5 MG TABS tablet ?furosemide (LASIX) 20 MG tablet ? ?2. How are you currently taking this medication (dosage and times per day)? Eliquis 1 tablet twice a day, furosemide 1 tablet daily ? ?3. Are you having a reaction (difficulty breathing--STAT)? no ? ?4. What is your medication issue? Patient states he is having more bleeding, confusing, dizziness. He says when he gets scratches he bleeds more. ?

## 2021-11-25 DIAGNOSIS — M5451 Vertebrogenic low back pain: Secondary | ICD-10-CM | POA: Diagnosis not present

## 2021-11-27 DIAGNOSIS — M5451 Vertebrogenic low back pain: Secondary | ICD-10-CM | POA: Diagnosis not present

## 2021-12-03 DIAGNOSIS — M5451 Vertebrogenic low back pain: Secondary | ICD-10-CM | POA: Diagnosis not present

## 2021-12-04 DIAGNOSIS — I2510 Atherosclerotic heart disease of native coronary artery without angina pectoris: Secondary | ICD-10-CM | POA: Diagnosis not present

## 2021-12-04 DIAGNOSIS — I5032 Chronic diastolic (congestive) heart failure: Secondary | ICD-10-CM | POA: Diagnosis not present

## 2021-12-04 DIAGNOSIS — R42 Dizziness and giddiness: Secondary | ICD-10-CM | POA: Diagnosis not present

## 2021-12-04 DIAGNOSIS — I10 Essential (primary) hypertension: Secondary | ICD-10-CM | POA: Diagnosis not present

## 2021-12-07 ENCOUNTER — Other Ambulatory Visit: Payer: Self-pay | Admitting: Cardiology

## 2021-12-08 ENCOUNTER — Ambulatory Visit: Payer: Medicare HMO | Admitting: Infectious Diseases

## 2021-12-08 NOTE — Telephone Encounter (Signed)
Prescription refill request for Eliquis received. Indication:afib Last office visit:10/17/21 Scr: 1.26 Age: 81 Weight:71.1kg

## 2021-12-11 ENCOUNTER — Other Ambulatory Visit: Payer: Self-pay

## 2021-12-11 ENCOUNTER — Other Ambulatory Visit: Payer: Self-pay | Admitting: Cardiology

## 2021-12-11 MED ORDER — ATORVASTATIN CALCIUM 20 MG PO TABS: 20.0000 mg | ORAL_TABLET | Freq: Every day | ORAL | 3 refills | Status: DC

## 2021-12-11 MED ORDER — AMIODARONE HCL 200 MG PO TABS: 200.0000 mg | ORAL_TABLET | Freq: Every day | ORAL | 3 refills | Status: DC

## 2021-12-16 ENCOUNTER — Ambulatory Visit: Payer: Medicare HMO | Admitting: Infectious Disease

## 2021-12-16 ENCOUNTER — Other Ambulatory Visit: Payer: Self-pay

## 2021-12-16 ENCOUNTER — Other Ambulatory Visit (HOSPITAL_COMMUNITY): Payer: Self-pay

## 2021-12-16 ENCOUNTER — Ambulatory Visit: Payer: Medicare HMO | Admitting: Physician Assistant

## 2021-12-16 ENCOUNTER — Encounter: Payer: Self-pay | Admitting: Infectious Disease

## 2021-12-16 VITALS — BP 149/71 | HR 67 | Temp 98.1°F | Ht 74.0 in | Wt 148.0 lb

## 2021-12-16 DIAGNOSIS — I472 Ventricular tachycardia, unspecified: Secondary | ICD-10-CM

## 2021-12-16 DIAGNOSIS — I2510 Atherosclerotic heart disease of native coronary artery without angina pectoris: Secondary | ICD-10-CM

## 2021-12-16 DIAGNOSIS — I4819 Other persistent atrial fibrillation: Secondary | ICD-10-CM | POA: Diagnosis not present

## 2021-12-16 DIAGNOSIS — A318 Other mycobacterial infections: Secondary | ICD-10-CM | POA: Diagnosis not present

## 2021-12-16 DIAGNOSIS — C44320 Squamous cell carcinoma of skin of unspecified parts of face: Secondary | ICD-10-CM | POA: Diagnosis not present

## 2021-12-16 HISTORY — DX: Other mycobacterial infections: A31.8

## 2021-12-16 NOTE — Progress Notes (Signed)
Subjective:  Reason for infectious disease consult: Mycobacterium chelonae I soft tissue infection  [place] physician: Amy Martinique, MD   Patient ID: [name], male    DOB: 07/10/1941, 81 y.o.   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 Martinique and has had history of basal cell carcinoma squamous cell carcinoma.  On August 19, 2021 he underwent Mohs procedure to right medial zygoma squamous cell carcinoma.  This was successfully cured but unfortunately he developed a lesion there that has been subsequently biopsied and found to have granulomas and mycobacteria seen on stain.  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  Susceptibilities are shown below but it is susceptible to clofazimine tigecycline seemingly with macrolide susceptibility still pending.       We are hoping that the organism is macrolide susceptible and that we can cure this with a 2 drug regimen of azithromycin and clofazimine.  We will run into risk of QT prolongation with these drugs and with his amiodarone and will likely initiate them in a stepwise fashion.  Our pharmacy staff will endeavor to cure clofazimine from [place] which will not cost any money.  Other options for treatment could include IV tigecycline which would likely be more affordable than oral omadacyline (has been cost prohibitive even in cases where covered in my Medicare patients)  Both these drugs however are fairly emetogenic which can pose problems in and of itself in particular because antinausea drugs are also typically ones that prolong the QT interval.  In any case we will plan on initiating these drugs stepwise and check EKGs 1 week  after each drug is started  Past Medical History:  Diagnosis Date   Ascending aorta dilatation ([place])    78m by chest CT angio 04/2018   CAD (coronary artery disease)    a. 05/2006 Abnl stress test w/ 2-367mST dep; b. 05/2006 Cath/PCI: LM nl, LAD 30-40ost, 20-30p, 90d (small), D1 nl, D2 nl, LCX nl, OM1/2 nl, RCA Ca2+, 9044m(3.5x16 Liberty BMS & 3.5x12 Liberty BMS), EF 50%.   Chronic diastolic CHF (congestive heart failure) ([place]  a. 10/2017 Echo: EF 50-55%, mild AI, sev dil LA, mod dil RA.   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 CHA2DS2VASc 5-->Eliquis; b. Prev WCT on flecainide;  c. 12/2017 s/p DCCV.   Pneumothorax    Ventricular tachycardia (HCC)    a. possibly proarrhythmia from flecainide.    Past Surgical History:  Procedure Laterality Date   CARDIOVERSION N/A 12/23/2017   Procedure: CARDIOVERSION;  Surgeon: TurSueanne MargaritaD;  [facility];  Service: Cardiovascular;  Laterality: N/A;   CARDIOVERSION N/A 03/08/2018   Procedure: CARDIOVERSION;  Surgeon: TurSueanne MargaritaD;  Location: [facility] ENDOSCOPY;  Service: Cardiovascular;  Laterality: N/A;   INGUINAL HERNIA REPAIR     x2   left total hip surgery     TEE WITHOUT CARDIOVERSION N/A 03/08/2018   Procedure: TRANSESOPHAGEAL ECHOCARDIOGRAM (TEE);  Surgeon: TurSueanne MargaritaD;  [facility];  Service: Cardiovascular;  Laterality: N/A;   TOTAL HIP ARTHROPLASTY Right 08/19/2020   Procedure: TOTAL HIP ARTHROPLASTY ANTERIOR  APPROACH;  Surgeon: Gaynelle Arabian, MD;  Location: [facility] ORS;  Service: Orthopedics;  Laterality: Right;  135mn   TOTAL KNEE ARTHROPLASTY Left 02/28/2018   Procedure: LEFT TOTAL KNEE ARTHROPLASTY;  Surgeon: AGaynelle Arabian MD;  Location: [facility] ORS;  Service: Orthopedics;  Laterality: Left;   VEIN LIGATION AND STRIPPING Left 04/08/2020   Procedure: LEFT LOWER EXTREMITY VEIN LIGATION AND EXCISION OF  ULCER;  Surgeon: DAngelia Mould MD;  [facility];  Service: Vascular;  Laterality: Left;    Family History  Problem Relation Age of Onset   Coronary artery disease Other       Social History   Socioeconomic History   Marital status: Widowed    Spouse name: Not on file   Number of children: Not on file   Years of education: Not on file   Highest education level: Not on file  Occupational History   Occupation: retired  Tobacco Use   Smoking status: Never   Smokeless tobacco: Never  Vaping Use   Vaping Use: Never used  Substance and Sexual Activity   Alcohol use: Not Currently    Comment: occsaoinal   Drug use: Not Currently    Types: Other-see comments   Sexual activity: Not Currently  Other Topics Concern   Not on file  Social History Narrative   [place] and help out when necessary.   Social Determinants of Health   Financial Resource Strain: Not on file  Food Insecurity: Not on file  Transportation Needs: Not on file  Physical Activity: Not on file  Stress: Not on file  Social Connections: Not on file    No Known Allergies   Current Outpatient Medications:    acetaminophen (TYLENOL) 325 MG tablet, Take 650 mg by mouth every 6 (six) hours as needed for moderate pain., Disp: , Rfl:    amiodarone (PACERONE) 200 MG tablet, Take 1 tablet (200 mg total) by mouth daily., Disp: 90 tablet, Rfl: 3   ascorbic acid (VITAMIN C) 500 MG tablet, Take 500 mg by mouth daily., Disp: , Rfl:    ELIQUIS 5 MG TABS tablet, TAKE 1 TABLET BY MOUTH TWICE A DAY, Disp: 180 tablet, Rfl: 1   furosemide (LASIX) 20 MG tablet, TAKE 1 TABLET BY MOUTH EVERY NIGHT AT BEDTIME, Disp: 90 tablet, Rfl: 3   lidocaine (LIDODERM) 5 %, Place 1 patch onto the skin daily., Disp: , Rfl:    Multiple Vitamin (MULTIVITAMIN WITH MINERALS) TABS tablet, Take 1 tablet by mouth daily., Disp: , Rfl:    Omega-3 Fatty Acids (FISH OIL) 1200 MG CAPS, Take 1,200 mg by mouth  daily., Disp: , Rfl:    atorvastatin (LIPITOR) 20 MG tablet, Take 1 tablet (20 mg total) by mouth daily., Disp: 90 tablet, Rfl: 3   Boswellia Serrata (BOSWELLIA PO), Take 1,200 mg by mouth daily., Disp: , Rfl:    Coenzyme Q10 (COQ10) 100 MG CAPS, Take 100 mg by mouth daily. (Patient not taking: Reported on 12/16/2021), Disp: , Rfl:    Misc Natural Products (GLUCOSAMINE CHONDROITIN TRIPLE) TABS, Take 1 tablet by mouth daily. (Patient not taking: Reported on 12/16/2021), Disp: , Rfl:    traMADol (ULTRAM) 50 MG tablet, Take 1-2 tablets (50-100 mg total) by mouth every 6 (six) hours as needed for moderate pain. (Patient not taking: Reported on 12/16/2021), Disp: 40 tablet, Rfl: 0   Zinc 50 MG CAPS, Take 100 mg by mouth 3 (three) times a week.  (Patient not  taking: Reported on 12/16/2021), Disp: , Rfl:     Review of Systems  Constitutional:  Negative for activity change, appetite change, chills, diaphoresis, fatigue, fever and unexpected weight change.  HENT:  Negative for congestion, rhinorrhea, sinus pressure, sneezing, sore throat and trouble swallowing.   Eyes:  Negative for photophobia and visual disturbance.  Respiratory:  Negative for cough, chest tightness, shortness of breath, wheezing and stridor.   Cardiovascular:  Negative for chest pain, palpitations and leg swelling.  Gastrointestinal:  Negative for abdominal distention, abdominal pain, anal bleeding, blood in stool, constipation, diarrhea, nausea and vomiting.  Genitourinary:  Negative for difficulty urinating, dysuria, flank pain and hematuria.  Musculoskeletal:  Negative for arthralgias, back pain, gait problem, joint swelling and myalgias.  Skin:  Negative for color change, pallor, rash and wound.  Neurological:  Negative for dizziness, tremors, weakness, light-headedness and headaches.  Hematological:  Negative for adenopathy. Does not bruise/bleed easily.  Psychiatric/Behavioral:  Negative for agitation, behavioral problems,  confusion, decreased concentration, dysphoric mood, sleep disturbance and suicidal ideas.       Objective:   Physical Exam Constitutional:      General: He is not in acute distress.    Appearance: Normal appearance. He is well-developed. He is not ill-appearing or diaphoretic.  HENT:     Head: Normocephalic and atraumatic.     Right Ear: Hearing and external ear normal.     Left Ear: Hearing and external ear normal.     Nose: No nasal deformity or rhinorrhea.  Eyes:     General: No scleral icterus.    Extraocular Movements: Extraocular movements intact.     Conjunctiva/sclera: Conjunctivae normal.     Right eye: Right conjunctiva is not injected.     Left eye: Left conjunctiva is not injected.     Pupils: Pupils are equal, round, and reactive to light.  Neck:     Vascular: No JVD.  Cardiovascular:     Rate and Rhythm: Normal rate and regular rhythm.     Heart sounds: S1 normal and S2 normal.  Pulmonary:     Effort: Pulmonary effort is normal. No respiratory distress.     Breath sounds: No wheezing.  Abdominal:     General: Bowel sounds are normal. There is no distension.     Palpations: Abdomen is soft.     Tenderness: There is no abdominal tenderness.  Musculoskeletal:        General: Normal range of motion.     Right shoulder: Normal.     Left shoulder: Normal.     Cervical back: Normal range of motion and neck supple.     Right hip: Normal.     Left hip: Normal.     Right knee: Normal.     Left knee: Normal.  Lymphadenopathy:     Head:     Right side of head: No submandibular, preauricular or posterior auricular adenopathy.     Left side of head: No submandibular, preauricular or posterior auricular adenopathy.     Cervical: No cervical adenopathy.     Right cervical: No superficial or deep cervical adenopathy.    Left cervical: No superficial or deep cervical adenopathy.  Skin:    General: Skin is warm and dry.     Coloration: Skin is not pale.     Findings: No  abrasion, bruising, ecchymosis, erythema, lesion or rash.     Nails: There is no clubbing.  Neurological:     General: No focal deficit present.  Mental Status: He is alert and oriented to person, place, and time.     Sensory: No sensory deficit.     Coordination: Coordination normal.     Gait: Gait normal.  Psychiatric:        Attention and Perception: He is attentive.        Mood and Affect: Mood normal.        Speech: Speech normal.        Behavior: Behavior normal. Behavior is cooperative.        Thought Content: Thought content normal.        Judgment: Judgment normal.    Lesion on zygoma 12/16/2021:         Assessment & Plan:   Mycobacterium chelonae soft tissue infection:  As mentioned we are waiting macrolide susceptibility testing and lab is looking for potential inducible macrolide resistance.  If macrolide is active would like to start with clofazimine for first week of therapy check EKG while on clofazimine and amiodarone then add azithromycin and then repeat EKG the following week.  If we run into problems with QT prolongation may need to consider IV tigecycline or oral omadacycline.  With clofazimine   Surgical excision or phage therapy (would need to be at [facility] or [facility] are other options to consider if we cannto cure this with conventional medical therapy.  I would want to get him through 3 if not 6 months of therapy  Fortunately he is not immunosuppressed  Ventricular tachycardia and atrial fibrillation on amiodarone seeing Dr. Radford Pax tomorrow.  Also on Eliquis.  I spent 84 minutes with the patient including than 50% of the time in face to face counseling of the patient regarding the type of infection Mycobacterium is in the need for multiple drugs to treat it for protracted duration potential risks involved with different therapies and how we would try to monitor for this and mitigate them personally reviewing path report culture data along with review of  medical records in preparation for the visit and during the visit and in coordination of his care.

## 2021-12-16 NOTE — Progress Notes (Signed)
Met with patient and Dr. Tommy Medal today regarding M. Chelonae skin infection. Multi-drug resistant isolate with pending azithromycin sensitivities. Plan to start azithromycin and clofazimine pending these sensitivities. If macrolide resistant, will consider tigecycline given significant Qtc prolongation risk with bedaquiline in combination with clofazimine and amiodarone along with difficulty in obtaining Nuzyra (high copay). Patient to see cardiology tomorrow; they will check baseline EKG. Will start [employer] application for clofazimine today and follow-up with patient in 3 weeks to start medications. Will start sequential therapy at that visit with clofazimine for 1 week then start azithromycin (or other antibiotic option). He will follow-up with Dr. Tommy Medal one week after starting clofazimine to check another EKG before beginning second agent. Reviewed counseling points today but will expand on these at follow-up visit with me.   Alfonse Spruce, PharmD, CPP Clinical Pharmacist Practitioner Infectious [facility]

## 2021-12-17 ENCOUNTER — Encounter: Payer: Self-pay | Admitting: Physician Assistant

## 2021-12-17 ENCOUNTER — Ambulatory Visit (INDEPENDENT_AMBULATORY_CARE_PROVIDER_SITE_OTHER): Payer: Medicare HMO

## 2021-12-17 ENCOUNTER — Ambulatory Visit: Payer: Medicare HMO | Admitting: Physician Assistant

## 2021-12-17 VITALS — BP 140/72 | HR 66 | Ht 74.0 in | Wt 148.8 lb

## 2021-12-17 DIAGNOSIS — I7121 Aneurysm of the ascending aorta, without rupture: Secondary | ICD-10-CM | POA: Diagnosis not present

## 2021-12-17 DIAGNOSIS — I10 Essential (primary) hypertension: Secondary | ICD-10-CM

## 2021-12-17 DIAGNOSIS — I495 Sick sinus syndrome: Secondary | ICD-10-CM

## 2021-12-17 DIAGNOSIS — I5032 Chronic diastolic (congestive) heart failure: Secondary | ICD-10-CM

## 2021-12-17 DIAGNOSIS — R55 Syncope and collapse: Secondary | ICD-10-CM

## 2021-12-17 DIAGNOSIS — E785 Hyperlipidemia, unspecified: Secondary | ICD-10-CM | POA: Diagnosis not present

## 2021-12-17 DIAGNOSIS — I2510 Atherosclerotic heart disease of native coronary artery without angina pectoris: Secondary | ICD-10-CM | POA: Diagnosis not present

## 2021-12-17 DIAGNOSIS — I4819 Other persistent atrial fibrillation: Secondary | ICD-10-CM | POA: Diagnosis not present

## 2021-12-17 MED ORDER — FUROSEMIDE 20 MG PO TABS: 20.0000 mg | ORAL_TABLET | ORAL | 3 refills | Status: DC

## 2021-12-17 NOTE — Assessment & Plan Note (Signed)
Overall, volume status is stable.  Given his episodes of lightheadedness, I have asked him to reduce his furosemide to 20 mg every other day to see if this helps.  If he has worsening shortness of breath or swelling, he should resume taking furosemide every day.

## 2021-12-17 NOTE — Assessment & Plan Note (Signed)
History of bare-metal stent to the RCA in 2007 and nonischemic stress test in January 2022.  He is not having anginal symptoms.  He exercises on a regular basis.  He is not on aspirin as he is on Apixaban.  Continue atorvastatin 20 mg daily.

## 2021-12-17 NOTE — Assessment & Plan Note (Signed)
Maintaining sinus rhythm.  Continue amiodarone 200 mg daily.  LFTs and TSH were normal in March 2023.  His weight is 67.5 kg and his creatinine has remained <1.5.  Continue Eliquis 5 mg twice daily.

## 2021-12-17 NOTE — Assessment & Plan Note (Signed)
As noted, his heart rate is improved off of carvedilol.  He does have episodes of lightheadedness.  Proceed with event monitor as noted.

## 2021-12-17 NOTE — Assessment & Plan Note (Signed)
Followed by Dr. Cyndia Bent.

## 2021-12-17 NOTE — Progress Notes (Unsigned)
[id number] ZIO AT from office inventory applied to patient.   Dr. Radford Pax to read.

## 2021-12-17 NOTE — Assessment & Plan Note (Signed)
He has episodes of lightheadedness but has not had frank syncope.  Some of his symptoms sound consistent with sinus congestion.  However, he has had recent bradycardia and remains on amiodarone.  His electrocardiogram today does not demonstrate any significant bradycardia or pauses.  However, I have recommended proceeding with a 14-day ZIO AT to rule out significant arrhythmia.  If this is unremarkable, I have asked him to follow-up with primary care to investigate other causes for his symptoms.  In the meantime I have asked him to start taking Flonase over-the-counter and Tylenol as needed.

## 2021-12-17 NOTE — Progress Notes (Signed)
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 Cyndia Bent) Mycobacterium chelonae soft tissue infection (ID: Dr. Tommy Medal)  Prior CV Studies: Chest/aorta CTA 07/23/2021 Ascending thoracic aorta 4.5 cm   Myoview 07/22/2020 EF 64 apical inferior and apical infarct with peri-infarct ischemia; low risk (no change from 2010)   Echocardiogram 07/17/2020 EF 60-65, no RWMA, GR 1 DD, normal RVSF, mild BAE, mild MR, trivial AI, mild AV sclerosis without stenosis, SoV borderline dilation (41 mm)   Monitor 05/08/2019 Sinus bradycardia (average heart rate 55); A-fib with RVR with heart rate up to 164; occasional PVCs and one 5 beat run of WCT   Cardiac catheterization 06/04/2006 LM patent LAD 30-40 ostial, 20-30 proximal, 90 distal (small) LCx patent RCA mid to distal 90 EF 50 PCI: 3.5 x 16 mm Liberte BMS and 3.5 x 12 mm Liberte BMS to the RCA  History of Present Illness:   [name] is a 81 y.o. male with the above problem list.  He Was last seen in March 2023.  He had significant bradycardia.  I stopped his beta-blocker.  He returns for follow-up.  He still notes lightheadedness at times.  He thinks he may have a headache associated with it.  He has not had syncope.  He has times where he feels normal in times where he has lightheadedness.  He has not had a spinning  sensation.  He has not fallen.  He exercises on a regular basis.  He has not had chest pain or significant shortness of breath.  He has not had orthopnea.  He has some chronic lower extremity swelling that is unchanged.    Past Medical History:  Diagnosis Date   Ascending aorta dilatation (HCC)    44m by chest CT angio 04/2018   CAD (coronary artery disease)    a. 05/2006 Abnl stress test w/ 2-311mST dep; b. 05/2006 Cath/PCI: LM nl, LAD 30-40ost, 20-30p, 90d (small), D1 nl, D2 nl, LCX nl, OM1/2 nl, RCA Ca2+, 9071m(3.5x16 Liberty BMS & 3.5x12 Liberty BMS), EF 50%.   Chronic diastolic CHF (congestive heart failure) ([place]  a. 10/2017 Echo: EF 50-55%, mild AI, sev dil LA, mod dil RA.   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 CHA2DS2VASc 5-->Eliquis; b. Prev WCT on flecainide;  c. 12/2017 s/p DCCV.   Pneumothorax    Ventricular tachycardia (HCC)    a. possibly proarrhythmia from flecainide.   Current Medications: Current Meds  Medication Sig   acetaminophen (TYLENOL) 325 MG tablet Take 650 mg by mouth every 6 (six) hours as needed for moderate pain.   amiodarone (PACERONE) 200 MG tablet Take 1 tablet (200 mg total) by mouth daily.   ascorbic acid (VITAMIN C) 500 MG tablet Take 500 mg by mouth daily.   atorvastatin (LIPITOR) 20 MG  tablet Take 1 tablet (20 mg total) by mouth daily.   Boswellia Serrata (BOSWELLIA PO) Take 1,200 mg by mouth daily.   ELIQUIS 5 MG TABS tablet TAKE 1 TABLET BY MOUTH TWICE A DAY   lidocaine (LIDODERM) 5 % Place 1 patch onto the skin daily.   Misc Natural Products (GLUCOSAMINE CHONDROITIN TRIPLE) TABS Take 1 tablet by mouth daily.   Multiple Vitamin (MULTIVITAMIN WITH MINERALS) TABS tablet Take 1 tablet by mouth daily.   Omega-3 Fatty Acids (FISH OIL) 1200 MG CAPS Take 1,200 mg by mouth daily.   [DISCONTINUED] furosemide (LASIX) 20 MG tablet TAKE 1 TABLET  BY MOUTH EVERY NIGHT AT BEDTIME    Allergies:   Patient has no known allergies.   Social History   Tobacco Use   Smoking status: Never   Smokeless tobacco: Never  Vaping Use   Vaping Use: Never used  Substance Use Topics   Alcohol use: Not Currently    Comment: occsaoinal   Drug use: Not Currently    Types: Other-see comments    Family Hx: The patient's family history includes Coronary artery disease in an other family member.  Review of Systems  Constitutional: Negative for fever.  Respiratory:  Negative for cough.   Gastrointestinal:  Negative for hematochezia.  Genitourinary:  Negative for hematuria.    EKGs/Labs/Other Test Reviewed:    EKG:  EKG is  ordered today.  The ekg ordered today demonstrates NSR, HR 73, normal axis, PACs, anteroseptal Q waves, QTc 453  Recent Labs: 10/17/2021: ALT 15; BUN 11; Creatinine, Ser 1.26; Hemoglobin 9.8; Platelets 213; Potassium 4.1; Sodium 140; TSH 1.630   Recent Lipid Panel Recent Labs    10/17/21 1014  CHOL 115  TRIG 47  HDL 47  LDLCALC 57     Risk Assessment/Calculations:    CHA2DS2-VASc Score = 5   This indicates a 7.2% annual risk of stroke. The patient's score is based upon: CHF History: 1 HTN History: 1 Diabetes History: 0 Stroke History: 0 Vascular Disease History: 1 Age Score: 2 Gender Score: 0        Physical Exam:    VS:  BP 140/72   Pulse 66   Ht '6\' 2"'$  (1.88 m)   Wt 148 lb 12.8 oz (67.5 kg)   SpO2 97%   BMI 19.10 kg/m     Wt Readings from Last 3 Encounters:  12/17/21 148 lb 12.8 oz (67.5 kg)  12/16/21 148 lb (67.1 kg)  10/17/21 156 lb 12.8 oz (71.1 kg)    Constitutional:      Appearance: Healthy appearance. Not in distress.  Neck:     Vascular: JVD normal.  Pulmonary:     Effort: Pulmonary effort is normal.     Breath sounds: No wheezing. No rales.  Cardiovascular:     Normal rate. Regular rhythm. Normal S1. Normal S2.      Murmurs: There is a grade 2/6 systolic murmur at the URSB.   Edema:    Peripheral edema present.    Pretibial: 1+ edema of the left pretibial area and trace edema of the right pretibial area. Abdominal:     Palpations: Abdomen is soft.  Skin:    General: Skin is warm and dry.  Neurological:     General: No focal deficit present.     Mental Status: Alert and oriented to person, place and time.     Cranial Nerves: Cranial nerves are intact.        ASSESSMENT & PLAN:  Near syncope He has episodes of lightheadedness but has not had frank syncope.  Some of his symptoms sound consistent with sinus congestion.  However, he has had recent bradycardia and remains on amiodarone.  His electrocardiogram today does not demonstrate any significant bradycardia or pauses.  However, I have recommended proceeding with a 14-day ZIO AT to rule out significant arrhythmia.  If this is unremarkable, I have asked him to follow-up with primary care to investigate other causes for his symptoms.  In the meantime I have asked him to start taking Flonase over-the-counter and Tylenol as needed.  (HFpEF) heart failure with preserved ejection fraction (HCC) Overall, volume status is stable.  Given his episodes of lightheadedness, I have asked him to reduce his furosemide to 20 mg every other day to see if this helps.  If he has worsening shortness of breath or swelling, he should resume taking furosemide every day.  Coronary artery disease involving native coronary artery of native heart without angina pectoris History of bare-metal stent to the RCA in 2007 and nonischemic stress test in January 2022.  He is not having anginal symptoms.  He exercises on a regular basis.  He is not on aspirin as he is on Apixaban.  Continue atorvastatin 20 mg daily.  Essential hypertension Fair control.  Continue to monitor.  Hyperlipidemia LDL goal <70 Recent LDL optimal.  Continue atorvastatin 20 mg daily.  Persistent atrial fibrillation (HCC) Maintaining sinus rhythm.  Continue amiodarone  200 mg daily.  LFTs and TSH were normal in March 2023.  His weight is 67.5 kg and his creatinine has remained <1.5.  Continue Eliquis 5 mg twice daily.  Tachycardia-bradycardia syndrome ([place]) As noted, his heart rate is improved off of carvedilol.  He does have episodes of lightheadedness.  Proceed with event monitor as noted.  Thoracic aortic aneurysm Followed by Dr. Cyndia Bent.          Dispo:  Return in about 6 months (around 06/18/2022) for Routine Follow Up with Dr. Radford Pax.   Medication Adjustments/Labs and Tests Ordered: Current medicines are reviewed at length with the patient today.  Concerns regarding medicines are outlined above.  Tests Ordered: Orders Placed This Encounter  Procedures   LONG TERM MONITOR-LIVE TELEMETRY (3-14 DAYS)   EKG 12-Lead   Medication Changes: Meds ordered this encounter  Medications   furosemide (LASIX) 20 MG tablet    Sig: Take 1 tablet (20 mg total) by mouth every other day.    Dispense:  45 tablet    Refill:  3   Signed, Richardson Dopp, PA-C  12/17/2021 [facility] [place], [place], [place]  [address] Phone: [phone]; Fax: [phone]

## 2021-12-17 NOTE — Patient Instructions (Signed)
Medication Instructions:  Your physician has recommended you make the following change in your medication:   CHANGE the Lasix to every other day.  If you have increased shortness of breath or swelling, you need to change it back to daily You can get over the counter Flonase and use it daily for 2 weeks then only as needed Tylenol 500 mg daily is ok to take  *If you need a refill on your cardiac medications before your next appointment, please call your pharmacy*   Lab Work: None ordered  If you have labs (blood work) drawn today and your tests are completely normal, you will receive your results only by: [place] (if you have MyChart) OR A paper copy in the mail If you have any lab test that is abnormal or we need to change your treatment, we will call you to review the results.   Testing/Procedures: ZIO AT Long term monitor-Live Telemetry  Your physician has requested you wear a ZIO patch monitor for 14 days.  This is a single patch monitor. [employer] supplies one patch monitor per enrollment. Additional  stickers are not available.  Please do not apply patch if you will be having a Nuclear Stress Test, Echocardiogram, Cardiac CT, MRI,  or Chest Xray during the period you would be wearing the monitor. The patch cannot be worn during  these tests. You cannot remove and re-apply the ZIO AT patch monitor.  Your ZIO patch monitor will be mailed 3 day USPS to your address on file. It may take 3-5 days to  receive your monitor after you have been enrolled.  Once you have received your monitor, please review the enclosed instructions. Your monitor has  already been registered assigning a specific monitor serial # to you.   Billing and Patient Assistance Program information  [name] has been supplied with any insurance information on record for billing. [employer] offers a sliding scale Patient Assistance Program for patients without insurance, or whose  insurance does not completely  cover the cost of the ZIO patch monitor. You must apply for the  Patient Assistance Program to qualify for the discounted rate. To apply, call [employer] at [phone],  select option 4, select option 2 , ask to apply for the Patient Assistance Program, (you can request an  interpreter if needed). [employer] will ask your household income and how many people are in your  household. [employer] will quote your out-of-pocket cost based on this information. They will also be able  to set up a 12 month interest free payment plan if needed.  Applying the monitor   Shave hair from upper left chest.  Hold the abrader disc by orange tab. Rub the abrader in 40 strokes over left upper chest as indicated in  your monitor instructions.  Clean area with 4 enclosed alcohol pads. Use all pads to ensure the area is cleaned thoroughly. Let  dry.  Apply patch as indicated in monitor instructions. Patch will be placed under collarbone on left side of  chest with arrow pointing upward.  Rub patch adhesive wings for 2 minutes. Remove the white label marked "1". Remove the white label  marked "2". Rub patch adhesive wings for 2 additional minutes.  While looking in a mirror, press and release button in center of patch. A small green light will flash 3-4  times. This will be your only indicator that the monitor has been turned on.  Do not shower for the first 24 hours. You may shower after  the first 24 hours.  Press the button if you feel a symptom. You will hear a small click. Record Date, Time and Symptom in  the Patient Log.   Starting the Gateway  In your kit there is a Hydrographic surveyor box the size of a cellphone. This is Airline pilot. It transmits all your  recorded data to [facility]. This box must always stay within 10 feet of you. Open the box and push the *  button. There will be a light that blinks orange and then green a few times. When the light stops  blinking, the Gateway is connected to the ZIO patch. Call  [employer] at [phone] to confirm your monitor is transmitting.  Returning your monitor  Remove your patch and place it inside the [place]. In the lower half of the Gateway there is a white  bag with prepaid postage on it. Place Gateway in bag and seal. Mail package back to [place] as soon as  possible. Your physician should have your final report approximately 7 days after you have mailed back  your monitor. Call [place] at [phone] if you have questions regarding your ZIO AT  patch monitor. Call them immediately if you see an orange light blinking on your monitor.  If your monitor falls off in less than 4 days, contact our Monitor department at [phone]. If your  monitor becomes loose or falls off after 4 days call [employer] at [phone] for suggestions on  securing your monitor    Follow-Up: At [facility], you and your health needs are our priority.  As part of our continuing mission to provide you with exceptional heart care, we have created designated Provider Care Teams.  These Care Teams include your primary Cardiologist (physician) and Advanced Practice Providers (APPs -  Physician Assistants and Nurse Practitioners) who all work together to provide you with the care you need, when you need it.  We recommend signing up for the patient portal called "MyChart".  Sign up information is provided on this After Visit Summary.  MyChart is used to connect with patients for Virtual Visits (Telemedicine).  Patients are able to view lab/test results, encounter notes, upcoming appointments, etc.  Non-urgent messages can be sent to your provider as well.   To learn more about what you can do with MyChart, go to [email or web address].    Your next appointment:   6 month(s)  The format for your next appointment:   In Person  Provider:   Fransico Him, MD     Other Instructions   Important Information About Sugar

## 2021-12-17 NOTE — Assessment & Plan Note (Signed)
Fair control.  Continue to monitor.

## 2021-12-17 NOTE — Assessment & Plan Note (Signed)
Recent LDL optimal.  Continue atorvastatin 20 mg daily.

## 2021-12-18 ENCOUNTER — Telehealth: Payer: Self-pay | Admitting: Cardiology

## 2021-12-18 DIAGNOSIS — R55 Syncope and collapse: Secondary | ICD-10-CM | POA: Diagnosis not present

## 2021-12-18 DIAGNOSIS — I4819 Other persistent atrial fibrillation: Secondary | ICD-10-CM | POA: Diagnosis not present

## 2021-12-18 DIAGNOSIS — I2510 Atherosclerotic heart disease of native coronary artery without angina pectoris: Secondary | ICD-10-CM | POA: Diagnosis not present

## 2021-12-18 DIAGNOSIS — I495 Sick sinus syndrome: Secondary | ICD-10-CM | POA: Diagnosis not present

## 2021-12-18 NOTE — Telephone Encounter (Signed)
Patient calling with question/ concerns from his discharge paperwork from yesterday appt. Please advise

## 2021-12-18 NOTE — Telephone Encounter (Signed)
Returned call to the pt.  Pt was concerned that it was never discussed with him in his office visit about having a monitor ordered / placed, and that he thought about it after he got home.  He said he knows he should have asked once he was told that we were placing one, but it didn't really dawn on him at the time.  I explained to the pt the reason it was ordered. He asked me to pass it along to [employer], [place], that it was never mentioned to him and he would like to talk to him. Pt advised Nicki Reaper was out of the office today, but would send a message to him, and he would return to the office tomorrow, 12/19/21.   Pt verbalized understanding and thanked me for the call back.

## 2021-12-18 NOTE — Telephone Encounter (Signed)
Can you find out what his question is? Richardson Dopp, PA-C    12/18/2021 12:45 PM

## 2021-12-19 ENCOUNTER — Telehealth: Payer: Self-pay | Admitting: Cardiology

## 2021-12-19 NOTE — Telephone Encounter (Signed)
Called patient and discussed reasons for cardiac monitor as was reviewed during his recent OV. All questions answered. Richardson Dopp, PA-C    12/19/2021 1:28 PM

## 2021-12-19 NOTE — Telephone Encounter (Signed)
Patient would like Dr. Theodosia Blender advice as to if he can or can not get a cortisone shot for his back. The patient was told by the Orthopedic Doctor that he is safe to have it, and was also told by Richardson Dopp that he is OK to have it. However he was told by Dr. Radford Pax that he should not get one. He would just like to know what he can or can not do prior to his upcoming appointment with the orthopedic Doctor

## 2021-12-19 NOTE — Telephone Encounter (Signed)
Sueanne Margarita, MD  You; Cv Div Ch St Triage 10 minutes ago (3:17 PM)   I do not recall telling him that he could not have an injection done.  He would have to hold his blood thinner for a few days prior to and after which does increase his risk of a CVA if he has A-fib during that time that he is off blood thinners and that would have to be a decision he makes with his physician who is doing the injection    Left message for pt to call back to discuss the above information.

## 2021-12-19 NOTE — Telephone Encounter (Signed)
Will send to Dr Radford Pax for review and orders

## 2021-12-22 ENCOUNTER — Telehealth: Payer: Self-pay | Admitting: Infectious Disease

## 2021-12-22 ENCOUNTER — Telehealth: Payer: Self-pay | Admitting: Cardiology

## 2021-12-22 ENCOUNTER — Telehealth: Payer: Self-pay | Admitting: Pharmacist

## 2021-12-22 NOTE — Telephone Encounter (Signed)
Patient called to talk with [name] about his zio monitor and how many days he should be wearing it. Please call back

## 2021-12-22 NOTE — Telephone Encounter (Signed)
Great news! He will be out of town during his originally scheduled appointment, but he will see me 6/27 to start clofazimine then you a week later to start azithromycin/check EKG. Thanks, Estill Bamberg

## 2021-12-22 NOTE — Telephone Encounter (Signed)
Pt is returning call.  

## 2021-12-22 NOTE — Telephone Encounter (Signed)
Final susceptibility data is back and fortunately his isolate is sensitive to macrolides and we can use azithromycin for him

## 2021-12-22 NOTE — Telephone Encounter (Signed)
Patient is approved to receive clofazimine. Medication should arrive to clinic in 7-10 business days. Patient has an appt on 01/13/22 with Estill Bamberg for pick up and counseling.  Trent Theisen L. Stacye Noori, PharmD [facility] Clinical Pharmacist Practitioner

## 2021-12-22 NOTE — Telephone Encounter (Signed)
Called patient back to give him Dr. Theodosia Blender advisement. Patient verbalized understanding.

## 2021-12-22 NOTE — Telephone Encounter (Signed)
Attempted phone call to pt and left voicemail message to contact office at [phone]. 

## 2021-12-23 NOTE — Telephone Encounter (Signed)
Ok to wear 10 days if unable to wear for the full 14. Richardson Dopp, PA-C    12/23/2021 4:28 PM

## 2021-12-23 NOTE — Telephone Encounter (Signed)
Returned call to pt.  He was questioning why he was having to wear the monitor for 14 days when the book that comes with the monitor states that 10 days is good enough. Explained to the pt that with his symptoms, that's what was recommended.  He was advised that the type of monitor he has, could be worn up to 30 days, but he only had to do 14.  Pt kept asking why 14 days, so I advised pt that was the recommendation that was recommended by the provider, and however long he wore it was his choice. Pt said, Ok and hung up phone.

## 2021-12-24 NOTE — Telephone Encounter (Signed)
Pt has been made aware that he can take the monitor off after 10 days.  He verbalized understanding and was grateful for the call.

## 2021-12-25 ENCOUNTER — Telehealth: Payer: Self-pay | Admitting: Pharmacist

## 2021-12-25 NOTE — Telephone Encounter (Signed)
2 bottles of clofazimine are located in the pharmacy office and will be picked up at patient's next visit with Alfonse Spruce on 01/13/22  Rollin Kotowski L. Lucy Woolever, PharmD, BCIDP, AAHIVP, CPP Clinical Pharmacist Practitioner Infect[facility] 12/25/2021, 2:59 PM

## 2021-12-26 DIAGNOSIS — Z96652 Presence of left artificial knee joint: Secondary | ICD-10-CM | POA: Diagnosis not present

## 2021-12-26 DIAGNOSIS — M1711 Unilateral primary osteoarthritis, right knee: Secondary | ICD-10-CM | POA: Diagnosis not present

## 2021-12-29 DIAGNOSIS — M5451 Vertebrogenic low back pain: Secondary | ICD-10-CM | POA: Diagnosis not present

## 2022-01-06 ENCOUNTER — Telehealth: Payer: Self-pay

## 2022-01-06 DIAGNOSIS — M5451 Vertebrogenic low back pain: Secondary | ICD-10-CM | POA: Diagnosis not present

## 2022-01-06 DIAGNOSIS — M5136 Other intervertebral disc degeneration, lumbar region: Secondary | ICD-10-CM | POA: Diagnosis not present

## 2022-01-06 NOTE — Telephone Encounter (Signed)
   Pre-operative Risk Assessment    Patient Name: [name]  DOB: 06/01/1941 MRN: [id number]      Request for Surgical Clearance    Procedure:   Lumbar ESI  Date of Surgery:  Clearance 02/03/22                                 Surgeon:  Suella Broad Surgeon's Group or Practice Name:  [facility] Phone number:  [phone] Fax number:  [phone]   Type of Clearance Requested:   - Medical  - Pharmacy:  Hold Apixaban (Eliquis) 3 days   Type of Anesthesia:  None  Using Numbing Spray   Additional requests/questions:    Signed, Pollie Poma   01/06/2022, 3:37 PM

## 2022-01-07 ENCOUNTER — Ambulatory Visit: Payer: Medicare HMO | Admitting: Pharmacist

## 2022-01-08 NOTE — Telephone Encounter (Signed)
Scott Is he waiting for any further workup prior to [facility]?

## 2022-01-08 NOTE — Telephone Encounter (Signed)
   Name: [name]  DOB: 1941-02-03  MRN: [id number]   Primary Cardiologist: Fransico Him, MD  Chart reviewed as part of pre-operative protocol coverage.   [name] was last seen on 12/17/21 by Richardson Dopp PAC.  He completed a heart monitor after a syncopal episode that did not show any significant arrhythmias.   Per our clinical pharmacist: Per office protocol, patient can hold Eliquis for 3 days prior to procedure.   Patient will not need bridging with Lovenox (enoxaparin) around procedure.  Therefore, based on ACC/AHA guidelines, the patient would be at acceptable risk for the planned procedure without further cardiovascular testing.   I will route this recommendation to the requesting party via Epic fax function and remove from pre-op pool. Please call with questions.  Ledora Bottcher, PA 01/08/2022, 9:34 AM

## 2022-01-08 NOTE — Telephone Encounter (Signed)
No.  His monitor did not show anything concerning.  He can proceed with ESI at acceptable risk. Richardson Dopp, PA-C    01/08/2022 9:10 AM

## 2022-01-08 NOTE — Telephone Encounter (Signed)
Patient with diagnosis of atrial fibrillation on Eliquis for anticoagulation.    Procedure: lumbar ESI Date of procedure: 02/03/22   CHA2DS2-VASc Score = 5   This indicates a 7.2% annual risk of stroke. The patient's score is based upon: CHF History: 1 HTN History: 1 Diabetes History: 0 Stroke History: 0 Vascular Disease History: 1 Age Score: 2 Gender Score: 0   CrCl 53 Platelet count 213  Per office protocol, patient can hold Eliquis for 3 days prior to procedure.   Patient will not need bridging with Lovenox (enoxaparin) around procedure.

## 2022-01-13 ENCOUNTER — Ambulatory Visit: Payer: Medicare HMO | Admitting: Pharmacist

## 2022-01-13 ENCOUNTER — Telehealth: Payer: Self-pay | Admitting: Cardiology

## 2022-01-13 NOTE — Telephone Encounter (Addendum)
Estill Bamberg and/or Cassie, is this safe along with his amiodarone? I saw it could be QT prolonging but I am not too familiar with this drug

## 2022-01-14 ENCOUNTER — Ambulatory Visit: Payer: Medicare HMO | Admitting: Infectious Disease

## 2022-01-14 NOTE — Telephone Encounter (Signed)
Good morning [name], we are planning on giving him clofazimine and azithromycin in conjunction with his amiodarone, and all 3 of these have the potential for QT prolongation as you mentioned. We have a baseline EKG and will be checking an EKG one week after starting clofazimine. Based on that results, we will then add azithromycin and check another EKG in 1 week and sequentially thereafter. [name] also received [name] blessing, and [name] should be getting EKGs with cardiology if [name] is out of town. He was supposed to pick up his medicine yesterday but never showed up. He called expressing concerns about the side effects he read online, so I am not surprised he reached out to you all. Let me know if you need anything else from [place]. Thanks for reaching out! - Estill Bamberg

## 2022-01-14 NOTE — Telephone Encounter (Signed)
Oh excellent.  I didn't realize Dr. Tommy Medal and Dr Radford Pax had already discussed it together. Sounds like you have a good plan in place.  I will let Dr. Theodosia Blender nurse know

## 2022-01-15 NOTE — Telephone Encounter (Signed)
Left message for patient advising that cardiology is in agreement with plan that infectious disease has put in place. Advised to call back with any questions.

## 2022-01-15 NOTE — Telephone Encounter (Signed)
Patient called back in still concerned about taking clofazimine. I advised patient that both Dr. Radford Pax and Dr. Tommy Medal had spoken and were okay with him being on this. Advised that Infectious Disease is going to be monitoring things closely. Patient still has multiple concerns. He is going to reach out to ID.

## 2022-01-16 ENCOUNTER — Ambulatory Visit (INDEPENDENT_AMBULATORY_CARE_PROVIDER_SITE_OTHER): Payer: Medicare HMO | Admitting: Pharmacist

## 2022-01-16 ENCOUNTER — Other Ambulatory Visit: Payer: Self-pay

## 2022-01-16 ENCOUNTER — Telehealth: Payer: Self-pay | Admitting: Cardiology

## 2022-01-16 DIAGNOSIS — A318 Other mycobacterial infections: Secondary | ICD-10-CM

## 2022-01-16 NOTE — Telephone Encounter (Signed)
Dr. Sterling Big would like for patient to have an EKG done.  He is being put on medication that can affect his heart and he would like for patient to have EKG done.

## 2022-01-16 NOTE — Patient Instructions (Addendum)
Today - Start Clofazimine Take 2 pills together one time a day before bed time. You can take it with your other medicines. You can take it with or without food. If you experience any side effects, do not hesitate to call us at [phone].  1 week - Get EKG & Start Azithromycin Get EKG with Cardiology next week (will send dates once scheduled) If your EKG is clear, you can start azithromycin '500mg'$  (1 tablet) once daily (will be at [facility] on [address])  1 week - Get Another EKG Get EKG wit Cardiology (will send dates once scheduled)  Follow-up with Dr. Tommy Medal on 8/7 at 2pm to follow-up on how your medicine is working and check bloodwork.

## 2022-01-16 NOTE — Telephone Encounter (Signed)
See note from Infectious Disease:  1 week - Get EKG & Start Azithromycin Get EKG with Cardiology next week (will send dates once scheduled) If your EKG is clear, you can start azithromycin '500mg'$  (1 tablet) once daily (will be at [facility] [address])   1 week - Get Another EKG Get EKG wit Cardiology (will send dates once scheduled)

## 2022-01-16 NOTE — Progress Notes (Signed)
HPI: [name] is a 81 y.o. male who presents to the [place] clinic for NTM follow-up.   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   Bilateral lower extremity edema 05/24/2018   Persistent atrial fibrillation ([place]) 03/16/2018   Thoracic aortic aneurysm (HCC)    (HFpEF) heart failure with preserved ejection fraction ([place])    Coronary artery disease involving native coronary artery of native heart without angina pectoris 06/26/2013   Ventricular tachycardia ([place])    Chronic venous insufficiency    Hyperlipidemia LDL goal <70 06/08/2011   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   AMIODARONE (PACERONE) 200 MG TABLET    Take 1 tablet (200 mg total) by mouth daily.   ASCORBIC ACID (VITAMIN C) 500 MG TABLET    Take 500 mg by mouth daily.   ATORVASTATIN (LIPITOR) 20 MG TABLET    Take 1 tablet (20 mg total) by mouth daily.   BOSWELLIA SERRATA (BOSWELLIA PO)    Take 1,200 mg by mouth daily.   ELIQUIS 5 MG TABS TABLET    TAKE 1 TABLET BY MOUTH TWICE A DAY   FUROSEMIDE (LASIX) 20 MG TABLET    Take 1 tablet (20 mg total) by mouth every other day.   LIDOCAINE (LIDODERM) 5 %    Place 1 patch onto the skin daily.   MISC NATURAL PRODUCTS (GLUCOSAMINE CHONDROITIN TRIPLE) TABS    Take 1 tablet by mouth daily.   MULTIPLE VITAMIN (MULTIVITAMIN WITH MINERALS) TABS TABLET    Take 1 tablet by mouth daily.   OMEGA-3 FATTY ACIDS (FISH OIL) 1200 MG CAPS    Take 1,200 mg by mouth daily.  Modified Medications   No medications on file  Discontinued  Medications   No medications on file    Allergies: No Known Allergies  Past Medical History: Past Medical History:  Diagnosis Date   Ascending aorta dilatation (HCC)    59m by chest CT angio 04/2018   CAD (coronary artery disease)    a. 05/2006 Abnl stress test w/ 2-379mST dep; b. 05/2006 Cath/PCI: LM nl, LAD 30-40ost, 20-30p, 90d (small), D1 nl, D2 nl, LCX nl, OM1/2 nl, RCA Ca2+, 9018m(3.5x16 Liberty BMS & 3.5x12 Liberty BMS), EF 50%.   Chronic diastolic CHF (congestive heart failure) ([facility]  a. 10/2017 Echo: EF 50-55%, mild AI, sev dil LA, mod dil RA.   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 CHA2DS2VASc 5-->Eliquis; b. Prev WCT on flecainide;  c. 12/2017 s/p DCCV.   Pneumothorax    Ventricular tachycardia (HCC)    a. possibly proarrhythmia from flecainide.    Social History: Social History   Socioeconomic History   Marital status: Widowed    Spouse name: Not on file   Number of children: Not on file   Years of education: Not on file   Highest education level: Not on file  Occupational History   Occupation: retired  Tobacco Use  Smoking status: Never   Smokeless tobacco: Never  Vaping Use   Vaping Use: Never used  Substance and Sexual Activity   Alcohol use: Not Currently    Comment: occsaoinal   Drug use: Not Currently    Types: Other-see comments   Sexual activity: Not Currently  Other Topics Concern   Not on file  Social History Narrative   Lives in [place] by himself but 2 dtrs nearby and help out when necessary.   Social Determinants of Health   Financial Resource Strain: Not on file  Food Insecurity: Not on file  Transportation Needs: Not on file  Physical Activity: Not on file  Stress: Not on file  Social Connections: Not on file    Labs: No results found for: "HIV1RNAQUANT", "HIV1RNAVL", "CD4TABS"  RPR and STI No results found for:  "LABRPR", "RPRTITER"      No data to display          Hepatitis B No results found for: "HEPBSAB", "HEPBSAG", "HEPBCAB" Hepatitis C No results found for: "HEPCAB", "HCVRNAPCRQN" Hepatitis A No results found for: "HAV" Lipids: Lab Results  Component Value Date   CHOL 115 10/17/2021   TRIG 47 10/17/2021   HDL 47 10/17/2021   CHOLHDL 2.4 10/17/2021   VLDL 7 06/19/2016   LDLCALC 57 10/17/2021    Assessment: [name] presents to clinic today for M. chelonae skin infection follow-up. He is here to pick up clofazimine and start sequential therapy with it today. He will take this for one week and then follow-up with cardiology to get an EKG. Based on the results, he will then start azithromycin '500mg'$  once daily. One week later, he will get another EKG. If his QT interval remains within normal limits, we will continue therapy per Dr. Derek Mound discretion. Scheduled follow-up with Dr. Tommy Medal in August.  Counseled to take TWO clofazimine capsules (100 mg) together once daily with food. Advised not to separate capsules and to make sure to take them together. Encouraged not to miss any doses and to continue taking until discontinued.  Counseled on what to do if dose is missed - if it is closer to the missed dose take immediately; if closer to next dose skip dose and take the next dose at the usual time. Counseled that side effects are usually observed on higher doses but that common side effects include GI upset with nausea and diarrhea. Counseled that clofazimine should also be taken with a full glass of water. Also counseled that medication can darken skin and other secretions such as tears, saliva, and urine. Advised to avoid direct sun exposure while on clofazimine as the medication can increase sun sensitivity.  Asked that they wear sunscreen, a hat, and long sleeves while outside.  Other common side effects include skin dryness and liver toxicity but advised that we will be monitoring liver  function throughout therapy. All side effects tend to resolve after discontinuation of clofazimine. Answered all questions. Patient will call me if any issues arise.   Plan: Start clofazimine Follow-up with cardiology in 1 week for EKG Follow-up with Dr. Tommy Medal on 8/7   Alfonse Spruce, PharmD, CPP Clinical Pharmacist Practitioner [facility] 01/16/2022, 3:27 PM

## 2022-01-19 NOTE — Telephone Encounter (Signed)
Left message for patient to call back to schedule nurse visits this week and next week for EKGs.

## 2022-01-19 NOTE — Telephone Encounter (Signed)
Spoke with the patient and scheduled him to come in for an EKG on 7/7.

## 2022-01-23 ENCOUNTER — Other Ambulatory Visit: Payer: Self-pay | Admitting: Pharmacist

## 2022-01-23 ENCOUNTER — Other Ambulatory Visit (HOSPITAL_COMMUNITY): Payer: Self-pay

## 2022-01-23 ENCOUNTER — Ambulatory Visit (INDEPENDENT_AMBULATORY_CARE_PROVIDER_SITE_OTHER): Payer: Medicare HMO

## 2022-01-23 ENCOUNTER — Telehealth: Payer: Self-pay

## 2022-01-23 VITALS — BP 140/96 | HR 53 | Ht 74.0 in | Wt 146.0 lb

## 2022-01-23 DIAGNOSIS — Z79899 Other long term (current) drug therapy: Secondary | ICD-10-CM | POA: Diagnosis not present

## 2022-01-23 DIAGNOSIS — I4819 Other persistent atrial fibrillation: Secondary | ICD-10-CM | POA: Diagnosis not present

## 2022-01-23 DIAGNOSIS — A318 Other mycobacterial infections: Secondary | ICD-10-CM

## 2022-01-23 MED ORDER — AZITHROMYCIN 500 MG PO TABS: 500.0000 mg | ORAL_TABLET | Freq: Every day | ORAL | 2 refills | Status: DC

## 2022-01-23 NOTE — Telephone Encounter (Signed)
Called and scheduled EKG nurse visit for 7/14 at 2:00 PM.

## 2022-01-23 NOTE — Progress Notes (Signed)
   Nurse Visit   Date of Encounter: 01/23/2022 ID: KAYLAN FRIEDMANN, DOB 1940-10-13, MRN [id number]  PCP:  Seward Carol, MD   [facility] Providers Cardiologist:  Fransico Him, MD      Visit Details   VS:  BP (!) 140/96   Pulse (!) 53   Ht '6\' 2"'$  (1.88 m)   Wt 146 lb (66.2 kg)   SpO2 99%   BMI 18.75 kg/m  , BMI Body mass index is 18.75 kg/m.  Wt Readings from Last 3 Encounters:  01/23/22 146 lb (66.2 kg)  12/17/21 148 lb 12.8 oz (67.5 kg)  12/16/21 148 lb (67.1 kg)     Reason for visit: EKG to assess for QT prolongation after starting clofazamine Performed today: Vitals, EKG, Provider consulted:Dr. Angelena Form (DOD), and Education Changes (medications, testing, etc.) : None today Length of Visit: 15 minutes   Patient taking amiodarone and was started on clofazamine 1 week ago by ID for skin infection. EKG performed today to assess for QT prolongation. Patient denies having any Sx. EKG showing Sinus Brady with PACs, 53 bpm. Reviewed with DOD, Dr. Angelena Form. QT stable. Will send a message to ID to let them know that they may proceed with plan to start azithromycin and let us know when patients starts so we can arrange another EKG 1 week after starting. Patient made aware and verbalized understanding.     Signed, Cleon Gustin, RN  01/23/2022 11:59 AM

## 2022-01-23 NOTE — Telephone Encounter (Signed)
  Patient taking amiodarone and was started on clofazamine 1 week ago by ID for skin infection. EKG performed today to assess for QT prolongation. Patient denies having any Sx. EKG showing Sinus Brady with PACs, 53 bpm. Reviewed with DOD, Dr. Angelena Form. QT stable. Will send a message to ID to let them know that they may proceed with plan to start azithromycin and let us know when patient starts so we can arrange another EKG 1 week after starting. Patient made aware and verbalized understanding.

## 2022-01-23 NOTE — Telephone Encounter (Signed)
Patient picking up azithromycin prescription today and will start this weekend. Please schedule EKG ~1 week from now. Thank you! - Estill Bamberg

## 2022-01-23 NOTE — Addendum Note (Signed)
Addended by: Drue Novel I on: 01/23/2022 03:32 PM   Modules accepted: Level of Service

## 2022-01-23 NOTE — Telephone Encounter (Signed)
Thank you for reaching out! Sent azithromycin to patient's preferred pharmacy and awaiting call back to see when he will pick it up and start. Will reach back out once I hear from him. [name]

## 2022-01-26 ENCOUNTER — Telehealth: Payer: Self-pay | Admitting: Cardiology

## 2022-01-26 NOTE — Telephone Encounter (Signed)
Pt telephone message received.  Message stated patient bleeding from hand, and wanted alternative medication. ( Pt on Eliquis )  Pt stated he is tired of bumping his hands / arms into things and getting bruised / bleeding due to thin skin.    Pt wanted to discuss another medication option, where he wouldn't bruise or bleed as much.   When asked about his injury / bleeding to his hand, pt stated he already treated it, and was already familiar with what I was advising him to do, and not interested in discussing.  He did briefly listen to education on home treatments to clean wound, using triple abx ointment to protect from infection and stop bleeding.  Pt has upcoming appointment on 7/14 at 200 pm, and advised to discuss other medication options with his provider then.  Pt stated his bleeding had stopped, and he applied a bandage.  Pt stated he will speak with the physician on Friday.

## 2022-01-26 NOTE — Telephone Encounter (Signed)
Thank you :)

## 2022-01-26 NOTE — Telephone Encounter (Signed)
Patient would like to speak to nurse about bleeding. He is currently bleed from his hand.  Patient wants to know what are some of the alternative medication he can take.

## 2022-01-30 ENCOUNTER — Ambulatory Visit (INDEPENDENT_AMBULATORY_CARE_PROVIDER_SITE_OTHER): Payer: Medicare HMO | Admitting: Cardiovascular Disease

## 2022-01-30 VITALS — BP 130/82 | Wt 148.4 lb

## 2022-01-30 DIAGNOSIS — I495 Sick sinus syndrome: Secondary | ICD-10-CM

## 2022-01-30 DIAGNOSIS — I472 Ventricular tachycardia, unspecified: Secondary | ICD-10-CM | POA: Diagnosis not present

## 2022-01-30 NOTE — Patient Instructions (Signed)
Medication Instructions: Your physician recommends that you continue on your current medications as directed. Please refer to the Current Medication list given to you today.  *If you need a refill on your cardiac medications before your next appointment, please call your pharmacy*   Follow-Up: At [facility] you and your health needs are our priority.  As part of our continuing mission to provide you with exceptional heart care, we have created designated Provider Care Teams.  These Care Teams include your primary Cardiologist (physician) and Advanced Practice Providers (APPs -  Physician Assistants and Nurse Practitioners) who all work together to provide you with the care you need, when you need it.  We recommend signing up for the patient portal called "MyChart".  Sign up information is provided on this After Visit Summary.  MyChart is used to connect with patients for Virtual Visits (Telemedicine).  Patients are able to view lab/test results, encounter notes, upcoming appointments, etc.  Non-urgent messages can be sent to your provider as well.   To learn more about what you can do with MyChart, go to [email or web address].    Your next appointment:   As scheduled   Provider:   Fransico Him, MD {   Important Information About Sugar

## 2022-01-30 NOTE — Progress Notes (Signed)
   Nurse Visit   Date of Encounter: 01/30/2022 ID: [name], DOB 1940-11-09, MRN [id number]  PCP:  Seward Carol, MD   [facility] Providers Cardiologist:  Fransico Him, MD      Visit Details   VS:  BP 130/82 (BP Location: Left Arm, Patient Position: Sitting)   Wt 148 lb 6.4 oz (67.3 kg)   BMI 19.05 kg/m  , BMI Body mass index is 19.05 kg/m.  Wt Readings from Last 3 Encounters:  01/30/22 148 lb 6.4 oz (67.3 kg)  01/23/22 146 lb (66.2 kg)  12/17/21 148 lb 12.8 oz (67.5 kg)     Reason for visit: EKG evaluation Performed today: Vitals, EKG, Provider consulted:Dr. Angelena Form , and Education Changes (medications, testing, etc.) : EKG Length of Visit: 20 minutes   Patient taking azithromycin for skin infection ordered by ID. Here today for EKG one week following start of azithromycin. Denies any CP or palpitations.  EKG reviewed by DOD, Dr. Angelena Form, QT stable. Will notify ID of today's visit and EKG result.   Signed, Tor Netters, RN  01/30/2022 2:25 PM

## 2022-02-05 ENCOUNTER — Telehealth: Payer: Self-pay | Admitting: *Deleted

## 2022-02-05 NOTE — Telephone Encounter (Signed)
Error

## 2022-02-05 NOTE — Telephone Encounter (Signed)
I have attempted to contact this patient by phone, but line is busy. I will continue to try later.

## 2022-02-05 NOTE — Telephone Encounter (Signed)
Patient was schedule on 02/03/22 to has his injection done, which he was cleared for and failed to hold his Eliquis 3 days prior. We have received another clearance stated that he has been rescheduled for his injection on 7/25 and to hold his Eliquis 3 days prior. Please advise.

## 2022-02-05 NOTE — Telephone Encounter (Signed)
Per office protocol, patient can hold Eliquis for 3 days prior to procedure.   Patient will not need bridging with Lovenox (enoxaparin) around procedure. He should take his last dose on the evening of Friday July 21.   Emmaline Life, NP-C    02/05/2022, 3:35 PM [place] [address], [address] Office [phone] Fax [phone]

## 2022-02-06 NOTE — Telephone Encounter (Signed)
Left very detailed message for the pt in regard to his Eliquis. Left message that he will take his Eliquis tonight as usual. He will begin  to hold his Eliquis as of Saturday morning 02/07/22 and hold until his procedure. Pt will resume Eliquis once Dr. Nelva Bush feels it is safe. I will fax these notes to Dr. Nelva Bush office today

## 2022-02-10 DIAGNOSIS — M5416 Radiculopathy, lumbar region: Secondary | ICD-10-CM | POA: Diagnosis not present

## 2022-02-23 ENCOUNTER — Encounter: Payer: Self-pay | Admitting: Infectious Disease

## 2022-02-23 ENCOUNTER — Other Ambulatory Visit: Payer: Self-pay

## 2022-02-23 ENCOUNTER — Ambulatory Visit: Payer: Medicare HMO | Admitting: Infectious Disease

## 2022-02-23 VITALS — BP 147/80 | HR 85 | Resp 16 | Ht 74.0 in | Wt 151.0 lb

## 2022-02-23 DIAGNOSIS — I10 Essential (primary) hypertension: Secondary | ICD-10-CM

## 2022-02-23 DIAGNOSIS — I2510 Atherosclerotic heart disease of native coronary artery without angina pectoris: Secondary | ICD-10-CM

## 2022-02-23 DIAGNOSIS — I472 Ventricular tachycardia, unspecified: Secondary | ICD-10-CM | POA: Diagnosis not present

## 2022-02-23 DIAGNOSIS — A318 Other mycobacterial infections: Secondary | ICD-10-CM | POA: Diagnosis not present

## 2022-02-23 DIAGNOSIS — I480 Paroxysmal atrial fibrillation: Secondary | ICD-10-CM | POA: Diagnosis not present

## 2022-02-23 MED ORDER — AZITHROMYCIN 500 MG PO TABS: 500.0000 mg | ORAL_TABLET | Freq: Every day | ORAL | 11 refills | Status: DC

## 2022-02-23 NOTE — Patient Instructions (Signed)
Please make an appt to pick up additional clofazamine from ID pharmacy at end of September or before then

## 2022-02-23 NOTE — Progress Notes (Signed)
Subjective:  Complaint follow-up for mycobacterial infection   Patient ID: [name], male    DOB: 1940-12-30, 81 y.o.   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 Martinique and has had history of basal cell carcinoma squamous cell carcinoma.  On August 19, 2021 he underwent Mohs procedure to right medial zygoma squamous cell carcinoma.  This was successfully cured but unfortunately he developed a lesion there that has been subsequently biopsied and found to have granulomas and mycobacteria seen on stain.  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  Susceptibilities are shown below though these were prior to macrolide S being known and organism was macrolide sensitive     We ended up starting clofazimine 2 tablets daily with follow-up with cardiology and review of EKG that showed no worsening of her QT prolongation and then we added azithromycin 500 mg daily again with follow-up with cardiology and stability on EKG if QT.  Says he is not suffering from any adverse effects from the antibiotics and he feels the lesion on his face is shrinking somewhat.  He did have concerns re a pedunculated area that was present before when I saw him and does not appear related to his infection.  Past Medical History:  Diagnosis Date   Ascending aorta dilatation (HCC)    30m by chest CT angio 04/2018   CAD (coronary artery disease)    a. 05/2006 Abnl stress test w/ 2-346mST dep; b. 05/2006 Cath/PCI: LM nl, LAD 30-40ost, 20-30p, 90d (small), D1 nl, D2 nl, LCX nl, OM1/2 nl, RCA Ca2+, 9070m(3.5x16 Liberty BMS & 3.5x12 Liberty BMS), EF 50%.   Chronic diastolic CHF (congestive heart failure) (HCCArnett   a. 10/2017 Echo: EF 50-55%, mild AI, sev dil LA, mod dil RA.   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 CHA2DS2VASc 5-->Eliquis; b. Prev WCT on flecainide;  c. 12/2017 s/p DCCV.   Pneumothorax    Ventricular tachycardia (HCC)    a. possibly proarrhythmia from flecainide.    Past Surgical History:  Procedure Laterality Date   CARDIOVERSION N/A 12/23/2017   Procedure: CARDIOVERSION;  Surgeon: TurSueanne MargaritaD;  [facility];  Service: Cardiovascular;  Laterality: N/A;   CARDIOVERSION N/A 03/08/2018   Procedure: CARDIOVERSION;  Surgeon: TurSueanne MargaritaD;  Location: [facility] ENDOSCOPY;  Service: Cardiovascular;  Laterality: N/A;   INGUINAL HERNIA REPAIR     x2   left total hip surgery     TEE WITHOUT CARDIOVERSION N/A 03/08/2018   Procedure: TRANSESOPHAGEAL ECHOCARDIOGRAM (TEE);  Surgeon: TurSueanne MargaritaD;  [facility];  Service: Cardiovascular;  Laterality: N/A;   TOTAL HIP ARTHROPLASTY Right 08/19/2020   Procedure: TOTAL HIP ARTHROPLASTY ANTERIOR APPROACH;  Surgeon: AluGaynelle ArabianD;  Location: [facility] ORS;  Service: Orthopedics;  Laterality: Right;  100m37m TOTAL KNEE ARTHROPLASTY Left 02/28/2018   Procedure: LEFT TOTAL KNEE ARTHROPLASTY;  Surgeon: AluiGaynelle Arabian;  Location: [facility] ORS;  Service: Orthopedics;  Laterality: Left;   VEIN LIGATION AND STRIPPING Left 04/08/2020   Procedure: LEFT LOWER EXTREMITY VEIN LIGATION AND EXCISION OF ULCER;  Surgeon: DickScot Dock  Judeth Cornfield, MD;  Location: [facility];  Service: Vascular;  Laterality: Left;    Family History  Problem Relation Age of Onset   Coronary artery disease Other       Social History   Socioeconomic History   Marital status: Widowed    Spouse name: Not on file   Number of children: Not on file   Years of education: Not on file   Highest education level: Not on file  Occupational History   Occupation:  retired  Tobacco Use   Smoking status: Never   Smokeless tobacco: Never  Vaping Use   Vaping Use: Never used  Substance and Sexual Activity   Alcohol use: Not Currently    Comment: occsaoinal   Drug use: Not Currently    Types: Other-see comments   Sexual activity: Not Currently  Other Topics Concern   Not on file  Social History Narrative   Lives in [place] by himself but 2 dtrs nearby and help out when necessary.   Social Determinants of Health   Financial Resource Strain: Not on file  Food Insecurity: Not on file  Transportation Needs: Not on file  Physical Activity: Not on file  Stress: Not on file  Social Connections: Not on file    No Known Allergies   Current Outpatient Medications:    acetaminophen (TYLENOL) 325 MG tablet, Take 650 mg by mouth every 6 (six) hours as needed for moderate pain., Disp: , Rfl:    amiodarone (PACERONE) 200 MG tablet, Take 1 tablet (200 mg total) by mouth daily., Disp: 90 tablet, Rfl: 3   ascorbic acid (VITAMIN C) 500 MG tablet, Take 500 mg by mouth daily., Disp: , Rfl:    atorvastatin (LIPITOR) 20 MG tablet, Take 1 tablet (20 mg total) by mouth daily., Disp: 90 tablet, Rfl: 3   azithromycin (ZITHROMAX) 500 MG tablet, Take 1 tablet (500 mg total) by mouth daily., Disp: 30 tablet, Rfl: 2   Boswellia Serrata (BOSWELLIA PO), Take 1,200 mg by mouth daily., Disp: , Rfl:    ELIQUIS 5 MG TABS tablet, TAKE 1 TABLET BY MOUTH TWICE A DAY, Disp: 180 tablet, Rfl: 1   furosemide (LASIX) 20 MG tablet, Take 1 tablet (20 mg total) by mouth every other day., Disp: 45 tablet, Rfl: 3   lidocaine (LIDODERM) 5 %, Place 1 patch onto the skin daily., Disp: , Rfl:    Misc Natural Products (GLUCOSAMINE CHONDROITIN TRIPLE) TABS, Take 1 tablet by mouth daily., Disp: , Rfl:    Multiple Vitamin (MULTIVITAMIN WITH MINERALS) TABS tablet, Take 1 tablet by mouth daily., Disp: , Rfl:    Omega-3 Fatty Acids (FISH OIL) 1200 MG CAPS, Take 1,200 mg by mouth daily., Disp: , Rfl:     PRESCRIPTION MEDICATION, Take 2 tablets by mouth daily., Disp: , Rfl:    carvedilol (COREG) 3.125 MG tablet, Take by mouth. (Patient not taking: Reported on 02/23/2022), Disp: , Rfl:     Review of Systems  Constitutional:  Negative for activity change, appetite change, chills, diaphoresis, fatigue, fever and unexpected weight change.  HENT:  Negative for congestion, rhinorrhea, sinus pressure, sneezing, sore throat and trouble swallowing.   Eyes:  Negative for photophobia and visual disturbance.  Respiratory:  Negative for cough, chest tightness, shortness of breath, wheezing and stridor.   Cardiovascular:  Negative for chest pain, palpitations and leg swelling.  Gastrointestinal:  Negative for abdominal distention, abdominal pain, anal bleeding, blood in stool, constipation, diarrhea, nausea and vomiting.  Genitourinary:  Negative  for difficulty urinating, dysuria, flank pain and hematuria.  Musculoskeletal:  Negative for arthralgias, back pain, gait problem, joint swelling and myalgias.  Skin:  Negative for color change, pallor, rash and wound.  Neurological:  Negative for dizziness, tremors, weakness, light-headedness and headaches.  Hematological:  Negative for adenopathy. Does not bruise/bleed easily.  Psychiatric/Behavioral:  Negative for agitation, behavioral problems, confusion, decreased concentration, dysphoric mood, sleep disturbance and suicidal ideas.        Objective:   Physical Exam Constitutional:      General: He is not in acute distress.    Appearance: Normal appearance. He is well-developed. He is not ill-appearing or diaphoretic.  HENT:     Head: Normocephalic and atraumatic.     Right Ear: Hearing and external ear normal.     Left Ear: Hearing and external ear normal.     Nose: No nasal deformity or rhinorrhea.  Eyes:     General: No scleral icterus.    Extraocular Movements: Extraocular movements intact.     Conjunctiva/sclera: Conjunctivae normal.     Right  eye: Right conjunctiva is not injected.     Left eye: Left conjunctiva is not injected.     Pupils: Pupils are equal, round, and reactive to light.  Neck:     Vascular: No JVD.  Cardiovascular:     Rate and Rhythm: Normal rate and regular rhythm.     Heart sounds: S1 normal and S2 normal.  Pulmonary:     Effort: Pulmonary effort is normal. No respiratory distress.     Breath sounds: No wheezing.  Abdominal:     General: Bowel sounds are normal. There is no distension.     Palpations: Abdomen is soft.     Tenderness: There is no abdominal tenderness.  Musculoskeletal:        General: Normal range of motion.     Right shoulder: Normal.     Left shoulder: Normal.     Cervical back: Normal range of motion and neck supple.     Right hip: Normal.     Left hip: Normal.     Right knee: Normal.     Left knee: Normal.  Lymphadenopathy:     Head:     Right side of head: No submandibular, preauricular or posterior auricular adenopathy.     Left side of head: No submandibular, preauricular or posterior auricular adenopathy.     Cervical: No cervical adenopathy.     Right cervical: No superficial or deep cervical adenopathy.    Left cervical: No superficial or deep cervical adenopathy.  Skin:    General: Skin is warm and dry.     Coloration: Skin is not pale.     Findings: No abrasion, bruising, ecchymosis, erythema, lesion or rash.     Nails: There is no clubbing.  Neurological:     General: No focal deficit present.     Mental Status: He is alert and oriented to person, place, and time.     Sensory: No sensory deficit.     Coordination: Coordination normal.     Gait: Gait normal.  Psychiatric:        Attention and Perception: He is attentive.        Mood and Affect: Mood normal.        Speech: Speech normal.        Behavior: Behavior normal. Behavior is cooperative.        Thought Content: Thought content normal.  Judgment: Judgment normal.     Lesion on zygoma  12/16/2021:    02/23/2022:        Assessment & Plan:   Mycobacterium chelonae infection:  We will check safety labs in the form of a CBC with differential and CMP and will continue on his clofazimine and azithromycin   Fortunately he is not immunosuppressed  Ventricular tachycardia history of atrial fibrillation on amiodarone and Eliquis  QT prolongation: QT stable

## 2022-02-24 ENCOUNTER — Telehealth: Payer: Self-pay

## 2022-02-24 LAB — CBC WITH DIFFERENTIAL/PLATELET
Absolute Monocytes: 583 cells/uL (ref 200–950)
Basophils Absolute: 31 cells/uL (ref 0–200)
Basophils Relative: 0.5 %
Eosinophils Absolute: 81 cells/uL (ref 15–500)
Eosinophils Relative: 1.3 %
HCT: 34.1 % — ABNORMAL LOW (ref 38.5–50.0)
Hemoglobin: 11 g/dL — ABNORMAL LOW (ref 13.2–17.1)
Lymphs Abs: 825 cells/uL — ABNORMAL LOW (ref 850–3900)
MCH: 26.7 pg — ABNORMAL LOW (ref 27.0–33.0)
MCHC: 32.3 g/dL (ref 32.0–36.0)
MCV: 82.8 fL (ref 80.0–100.0)
MPV: 10.6 fL (ref 7.5–12.5)
Monocytes Relative: 9.4 %
Neutro Abs: 4681 cells/uL (ref 1500–7800)
Neutrophils Relative %: 75.5 %
Platelets: 208 10*3/uL (ref 140–400)
RBC: 4.12 10*6/uL — ABNORMAL LOW (ref 4.20–5.80)
RDW: 16.7 % — ABNORMAL HIGH (ref 11.0–15.0)
Total Lymphocyte: 13.3 %
WBC: 6.2 10*3/uL (ref 3.8–10.8)

## 2022-02-24 LAB — COMPLETE METABOLIC PANEL WITH GFR
AG Ratio: 2.1 (calc) (ref 1.0–2.5)
ALT: 31 U/L (ref 9–46)
AST: 38 U/L — ABNORMAL HIGH (ref 10–35)
Albumin: 3.8 g/dL (ref 3.6–5.1)
Alkaline phosphatase (APISO): 71 U/L (ref 35–144)
BUN/Creatinine Ratio: 12 (calc) (ref 6–22)
BUN: 15 mg/dL (ref 7–25)
CO2: 29 mmol/L (ref 20–32)
Calcium: 8.6 mg/dL (ref 8.6–10.3)
Chloride: 104 mmol/L (ref 98–110)
Creat: 1.24 mg/dL — ABNORMAL HIGH (ref 0.70–1.22)
Globulin: 1.8 g/dL (calc) — ABNORMAL LOW (ref 1.9–3.7)
Glucose, Bld: 96 mg/dL (ref 65–99)
Potassium: 4.1 mmol/L (ref 3.5–5.3)
Sodium: 140 mmol/L (ref 135–146)
Total Bilirubin: 0.4 mg/dL (ref 0.2–1.2)
Total Protein: 5.6 g/dL — ABNORMAL LOW (ref 6.1–8.1)
eGFR: 58 mL/min/{1.73_m2} — ABNORMAL LOW (ref >=60)

## 2022-02-24 LAB — SEDIMENTATION RATE: Sed Rate: 9 mm/h (ref 0–20)

## 2022-02-24 LAB — C-REACTIVE PROTEIN: CRP: 0.5 mg/L (ref <8.0)

## 2022-02-24 NOTE — Telephone Encounter (Signed)
Patient returned call. Relayed results to patient. Did not have any questions at this time. Leatrice Jewels, RMA

## 2022-02-24 NOTE — Telephone Encounter (Signed)
-----   Message from Truman Hayward, MD sent at 02/24/2022  8:30 AM EDT ----- Liver function tests slightly abnormal but ok with that should continue his antibiotics, his kidney fxn is less optimal. May need to increase his hydration re this ----- Message ----- From: Cheyenne Adas Lab Results In Sent: 02/23/2022  10:59 PM EDT To: Truman Hayward, MD

## 2022-02-26 DIAGNOSIS — M5451 Vertebrogenic low back pain: Secondary | ICD-10-CM | POA: Diagnosis not present

## 2022-03-02 ENCOUNTER — Telehealth: Payer: Self-pay | Admitting: Cardiology

## 2022-03-02 NOTE — Telephone Encounter (Signed)
Spoke with the patient who reports that he feels off balance when he walks sometimes. He wanted to know if any of his medications could be causing this. He states that he also has back issues. He reports that his HR and BP are good. Blood pressure usually 120-130/70-80 and heart rate 50s-70s. He denies any lightheadedness or dizziness when walking only occasionally when he stands up too quickly. Encouraged patient to ensure that he is changing positions slowly and drink plenty of fluids.

## 2022-03-02 NOTE — Telephone Encounter (Signed)
Pt c/o medication issue:  1. Name of Medication: Eliquis  2. How are you currently taking this medication (dosage and times per day)?   3. Are you having a reaction (difficulty breathing--STAT)?   4. What is your medication issue? Bruising a lot

## 2022-03-02 NOTE — Telephone Encounter (Signed)
Spoke with the patient who complains of excessive bruising on his hands and arms. He would like to know if he can get off of any of his medications that might be causing this. I advised him that since he is on Elqius he is going to bruise more easily, however he needs to remain on this medication to reduce his risk of stroke. Patient verbalized understanding.

## 2022-03-02 NOTE — Telephone Encounter (Signed)
Pt states he wants to know if any of the medications he's taking are responsible for causing lightheadedness and wobbling when he walks. Please advise.

## 2022-03-16 ENCOUNTER — Other Ambulatory Visit: Payer: Self-pay

## 2022-03-16 MED ORDER — APIXABAN 5 MG PO TABS: 5.0000 mg | ORAL_TABLET | Freq: Two times a day (BID) | ORAL | 1 refills | Status: DC

## 2022-03-16 NOTE — Telephone Encounter (Signed)
Prescription refill request for Eliquis received. Indication:Afib Last office visit:7/23 Scr:1.2 Age: 81 Weight:68.5 kg  Prescription refilled

## 2022-03-17 ENCOUNTER — Ambulatory Visit
Admission: EM | Admit: 2022-03-17 | Discharge: 2022-03-17 | Disposition: A | Discharge status: Home / Self Care | Payer: Medicare HMO | Attending: Urgent Care | Admitting: Urgent Care

## 2022-03-17 ENCOUNTER — Encounter: Payer: Self-pay | Admitting: Emergency Medicine

## 2022-03-17 ENCOUNTER — Ambulatory Visit (INDEPENDENT_AMBULATORY_CARE_PROVIDER_SITE_OTHER): Payer: Medicare HMO

## 2022-03-17 DIAGNOSIS — S81011A Laceration without foreign body, right knee, initial encounter: Secondary | ICD-10-CM | POA: Diagnosis not present

## 2022-03-17 DIAGNOSIS — S81811A Laceration without foreign body, right lower leg, initial encounter: Secondary | ICD-10-CM

## 2022-03-17 DIAGNOSIS — Z7901 Long term (current) use of anticoagulants: Secondary | ICD-10-CM

## 2022-03-17 DIAGNOSIS — M79661 Pain in right lower leg: Secondary | ICD-10-CM | POA: Diagnosis not present

## 2022-03-17 DIAGNOSIS — W19XXXA Unspecified fall, initial encounter: Secondary | ICD-10-CM | POA: Diagnosis not present

## 2022-03-17 MED ORDER — TETANUS-DIPHTH-ACELL PERTUSSIS 5-2.5-18.5 LF-MCG/0.5 IM SUSY: 0.5000 mL | PREFILLED_SYRINGE | Freq: Once | INTRAMUSCULAR | Status: DC

## 2022-03-17 MED ORDER — ACETAMINOPHEN 325 MG PO TABS: 650.0000 mg | ORAL_TABLET | Freq: Four times a day (QID) | ORAL | 0 refills | Status: AC | PRN

## 2022-03-17 MED ORDER — DOXYCYCLINE HYCLATE 100 MG PO CAPS: 100.0000 mg | ORAL_CAPSULE | Freq: Two times a day (BID) | ORAL | 0 refills | Status: DC

## 2022-03-17 NOTE — Discharge Instructions (Addendum)
WOUND CARE Please return in 14 days to have your stitches/staples removed or sooner if you have concerns.  Keep area clean and dry for 24 hours. Do not remove bandage, if applied.  After 24 hours, remove bandage and wash wound gently with mild soap and warm water. Reapply a new bandage after cleaning wound, if directed.  Continue daily cleansing with soap and water until stitches/staples are removed.  Do not apply any ointments or creams to the wound while stitches/staples are in place, as this may cause delayed healing.  Notify the office if you experience any of the following signs of infection: Swelling, redness, pus drainage, streaking, fever >101.0 F  Notify the office if you experience excessive bleeding that does not stop after 15-20 minutes of constant, firm pressure.  

## 2022-03-17 NOTE — ED Provider Notes (Addendum)
[place]   MRN: [id number] DOB: [birth year]  Subjective:   [name] is a 81 y.o. male presenting for right knee pain from a laceration he sustained this morning.  Patient excellently fell making impact against the bed frame this morning.  Denies head injury, loss of consciousness.  Last Tdap was in 2020.  Patient is on Eliquis.  No current facility-administered medications for this encounter.  Current Outpatient Medications:    acetaminophen (TYLENOL) 325 MG tablet, Take 650 mg by mouth every 6 (six) hours as needed for moderate pain., Disp: , Rfl:    amiodarone (PACERONE) 200 MG tablet, Take 1 tablet (200 mg total) by mouth daily., Disp: 90 tablet, Rfl: 3   apixaban (ELIQUIS) 5 MG TABS tablet, Take 1 tablet (5 mg total) by mouth 2 (two) times daily., Disp: 180 tablet, Rfl: 1   ascorbic acid (VITAMIN C) 500 MG tablet, Take 500 mg by mouth daily., Disp: , Rfl:    atorvastatin (LIPITOR) 20 MG tablet, Take 1 tablet (20 mg total) by mouth daily., Disp: 90 tablet, Rfl: 3   azithromycin (ZITHROMAX) 500 MG tablet, Take 1 tablet (500 mg total) by mouth daily., Disp: 30 tablet, Rfl: 11   Boswellia Serrata (BOSWELLIA PO), Take 1,200 mg by mouth daily., Disp: , Rfl:    carvedilol (COREG) 3.125 MG tablet, Take by mouth., Disp: , Rfl:    furosemide (LASIX) 20 MG tablet, Take 1 tablet (20 mg total) by mouth every other day., Disp: 45 tablet, Rfl: 3   lidocaine (LIDODERM) 5 %, Place 1 patch onto the skin daily., Disp: , Rfl:    Misc Natural Products (GLUCOSAMINE CHONDROITIN TRIPLE) TABS, Take 1 tablet by mouth daily., Disp: , Rfl:    Multiple Vitamin (MULTIVITAMIN WITH MINERALS) TABS tablet, Take 1 tablet by mouth daily., Disp: , Rfl:    Omega-3 Fatty Acids (FISH OIL) 1200 MG CAPS, Take 1,200 mg by mouth daily., Disp: , Rfl:    PRESCRIPTION MEDICATION, Take 2 tablets by mouth daily., Disp: , Rfl:    No Known Allergies  Past Medical History:  Diagnosis Date    Ascending aorta dilatation ([place])    79m by chest CT angio 04/2018   CAD (coronary artery disease)    a. 05/2006 Abnl stress test w/ 2-358mST dep; b. 05/2006 Cath/PCI: LM nl, LAD 30-40ost, 20-30p, 90d (small), D1 nl, D2 nl, LCX nl, OM1/2 nl, RCA Ca2+, 9088m(3.5x16 Liberty BMS & 3.5x12 Liberty BMS), EF 50%.   Chronic diastolic CHF (congestive heart failure) ([facility]  a. 10/2017 Echo: EF 50-55%, mild AI, sev dil LA, mod dil RA.   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 CHA2DS2VASc 5-->Eliquis; b. Prev WCT on flecainide;  c. 12/2017 s/p DCCV.   Pneumothorax    Ventricular tachycardia (HCC)    a. possibly proarrhythmia from flecainide.     Past Surgical History:  Procedure Laterality Date   CARDIOVERSION N/A 12/23/2017   Procedure: CARDIOVERSION;  Surgeon: TurSueanne MargaritaD;  [facility];  Service: Cardiovascular;  Laterality: N/A;   CARDIOVERSION N/A 03/08/2018   Procedure: CARDIOVERSION;  Surgeon: TurSueanne MargaritaD;  Location: [facility] ENDOSCOPY;  Service: Cardiovascular;  Laterality: N/A;   INGUINAL HERNIA REPAIR     x2   left total hip surgery     TEE WITHOUT CARDIOVERSION N/A 03/08/2018   Procedure:  TRANSESOPHAGEAL ECHOCARDIOGRAM (TEE);  Surgeon: Sueanne Margarita, MD;  Location: [facility] ENDOSCOPY;  Service: Cardiovascular;  Laterality: N/A;   TOTAL HIP ARTHROPLASTY Right 08/19/2020   Procedure: TOTAL HIP ARTHROPLASTY ANTERIOR APPROACH;  Surgeon: Gaynelle Arabian, MD;  Location: [facility] ORS;  Service: Orthopedics;  Laterality: Right;  151mn   TOTAL KNEE ARTHROPLASTY Left 02/28/2018   Procedure: LEFT TOTAL KNEE ARTHROPLASTY;  Surgeon: AGaynelle Arabian MD;  Location: [facility] ORS;  Service: Orthopedics;  Laterality: Left;   VEIN LIGATION AND STRIPPING Left 04/08/2020   Procedure: LEFT LOWER EXTREMITY VEIN LIGATION AND EXCISION OF ULCER;  Surgeon: DAngelia Mould MD;  [facility]   Service: Vascular;  Laterality: Left;    Family History  Problem Relation Age of Onset   Coronary artery disease Other     Social History   Tobacco Use   Smoking status: Never   Smokeless tobacco: Never  Vaping Use   Vaping Use: Never used  Substance Use Topics   Alcohol use: Not Currently    Comment: occsaoinal   Drug use: Not Currently    Types: Other-see comments    ROS   Objective:   Vitals: BP 116/61   Pulse 60   Temp 98.3 F (36.8 C)   Resp 18   SpO2 98%   Physical Exam Constitutional:      General: He is not in acute distress.    Appearance: Normal appearance. He is well-developed and normal weight. He is not ill-appearing, toxic-appearing or diaphoretic.  HENT:     Head: Normocephalic and atraumatic.     Right Ear: External ear normal.     Left Ear: External ear normal.     Nose: Nose normal.     Mouth/Throat:     Pharynx: Oropharynx is clear.  Eyes:     General: No scleral icterus.       Right eye: No discharge.        Left eye: No discharge.     Extraocular Movements: Extraocular movements intact.  Cardiovascular:     Rate and Rhythm: Normal rate.  Pulmonary:     Effort: Pulmonary effort is normal.  Musculoskeletal:     Cervical back: Normal range of motion.     Right lower leg: Swelling, laceration and tenderness present. No deformity or bony tenderness. No edema.       Legs:  Neurological:     Mental Status: He is alert and oriented to person, place, and time.     Cranial Nerves: No cranial nerve deficit.     Motor: No weakness.     Coordination: Coordination normal.     Gait: Gait normal.  Psychiatric:        Mood and Affect: Mood normal.        Behavior: Behavior normal.        Thought Content: Thought content normal.        Judgment: Judgment normal.     PROCEDURE NOTE: laceration repair Verbal consent obtained from patient.  Local anesthesia with 20cc Lidocaine 2% with epinephrine.  Wound explored for tendon, ligament damage.  Wound scrubbed with soap and water and rinsed. Wound closed with 4-0 Ethilon (horizontal mattress interlocking with simple interrupted) sutures.  Wound cleansed and dressed.      DG Knee Complete 4 Views Right  Result Date: 03/17/2022 CLINICAL DATA:  Right knee laceration EXAM: RIGHT KNEE - COMPLETE 4+ VIEW COMPARISON:  None Available. FINDINGS: No evidence of fracture, dislocation, or significant joint effusion. Normally located appearance  of the patella. Osteopenia and extensive atheromatous calcification. IMPRESSION: Negative for fracture or malalignment. Electronically Signed   By: Jorje Guild M.D.   On: 03/17/2022 12:22     Assessment and Plan :   PDMP not reviewed this encounter.  1. Pain in right lower leg   2. Leg laceration, right, initial encounter   3. Accidental fall, initial encounter   4. On continuous oral anticoagulation    Low suspicion for an intracranial injury.  Extensive laceration repair performed.  Laceration well approximated.  Discussed wound care.  Given the nature of the wound and the high risk for infection, recommended doxycycline for antibiotic prophylaxis.  X-ray was negative.  Follow-up in 2 weeks for suture removal. Counseled patient on potential for adverse effects with medications prescribed/recommended today, ER and return-to-clinic precautions discussed, patient verbalized understanding.     Jaynee Eagles, PA-C 03/17/22 1244

## 2022-03-17 NOTE — ED Triage Notes (Signed)
Pt hit right knee on bed frame this morning and sustained a large laceration to inferior part of knee over the patellar tendon region. Pt is bleeding moderate amount of blood in triage and is on Eliquis.

## 2022-04-01 ENCOUNTER — Ambulatory Visit
Admission: EM | Admit: 2022-04-01 | Discharge: 2022-04-01 | Disposition: A | Discharge status: Home / Self Care | Payer: Medicare HMO

## 2022-04-01 NOTE — ED Triage Notes (Signed)
Patient presents for suture removal. The wound is well healed without signs of infection.

## 2022-04-01 NOTE — Discharge Instructions (Signed)
Please monitor for signs of infection (drainage, odor, redness, and swelling) or any opening/s of the wound. If you notice any of these symptoms or have complications to the area please return for re-evaluation.  

## 2022-04-01 NOTE — ED Notes (Signed)
15 SUTURES successfully removed.

## 2022-04-02 DIAGNOSIS — M5451 Vertebrogenic low back pain: Secondary | ICD-10-CM | POA: Diagnosis not present

## 2022-04-03 ENCOUNTER — Telehealth: Payer: Self-pay | Admitting: Cardiology

## 2022-04-03 NOTE — Telephone Encounter (Signed)
Returned call to patient and discussed his concerns.  Patient states he felt "woozy" after taking new Rx for oxycodone '5mg'$  to day. He reports he would feel this way occasionally before when he took his Eliquis in the mornings, but it was worse this morning when he took the oxycodone.  Informed patient that dizziness/lightheadedness is a potential side effect of oxycodone. Patient verbalized understanding and states he is concerned about a potential interaction between the oxycodone and his other medications.  Will forward to Pharm D to review and advise.

## 2022-04-03 NOTE — Telephone Encounter (Signed)
Pt c/o medication issue:  1. Name of Medication: Oxycodone 5 mg  2. How are you currently taking this medication (dosage and times per day)? Has taken 1 tablet today around 11:45-12:00   3. Are you having a reaction (difficulty breathing--STAT)? Unsure  4. What is your medication issue? Patient is calling stating that he has been prescribed oxy due to lower back pain. It advises for him to take 1 tablet every 6-8 hours for 5 days. He is wanting to confirm this medication doesn't interfere with any of his current medications due to feeling a little "woozy" since taking his first tablet today. Please advise.

## 2022-04-03 NOTE — Telephone Encounter (Signed)
There is a minor interaction with oxycodone and his furosemide (can both increase and decrease effect of diuretics). Patient should monitor his blood pressure and his fluid status (ie weight and swelling) closely.

## 2022-04-03 NOTE — Telephone Encounter (Signed)
Spoke with patient and discussed pharmacist's feedback: There is a minor interaction with oxycodone and his furosemide (can both increase and decrease effect of diuretics). Patient should monitor his blood pressure and his fluid status (ie weight and swelling) closely.    Patient verbalized understanding and expressed appreciation for assistance.

## 2022-04-06 ENCOUNTER — Telehealth: Payer: Self-pay | Admitting: Pharmacist

## 2022-04-06 NOTE — Telephone Encounter (Signed)
Thank you Cassie! 

## 2022-04-06 NOTE — Telephone Encounter (Signed)
2 bottles of clofazimine are located in the pharmacy office when patient needs a refill.  Correll Denbow L. [place], PharmD, BCIDP, AAHIVP, CPP Clinical Pharmacist Practitioner [facility] 04/06/2022, 2:20 PM

## 2022-04-14 DIAGNOSIS — D485 Neoplasm of uncertain behavior of skin: Secondary | ICD-10-CM | POA: Diagnosis not present

## 2022-04-14 DIAGNOSIS — L400 Psoriasis vulgaris: Secondary | ICD-10-CM | POA: Diagnosis not present

## 2022-04-14 DIAGNOSIS — C44329 Squamous cell carcinoma of skin of other parts of face: Secondary | ICD-10-CM | POA: Diagnosis not present

## 2022-04-14 DIAGNOSIS — Z85828 Personal history of other malignant neoplasm of skin: Secondary | ICD-10-CM | POA: Diagnosis not present

## 2022-04-14 DIAGNOSIS — C4442 Squamous cell carcinoma of skin of scalp and neck: Secondary | ICD-10-CM | POA: Diagnosis not present

## 2022-04-14 DIAGNOSIS — D692 Other nonthrombocytopenic purpura: Secondary | ICD-10-CM | POA: Diagnosis not present

## 2022-04-14 DIAGNOSIS — L570 Actinic keratosis: Secondary | ICD-10-CM | POA: Diagnosis not present

## 2022-04-16 ENCOUNTER — Ambulatory Visit: Payer: Medicare HMO | Admitting: Pharmacist

## 2022-04-23 DIAGNOSIS — M1711 Unilateral primary osteoarthritis, right knee: Secondary | ICD-10-CM | POA: Diagnosis not present

## 2022-04-23 DIAGNOSIS — M47816 Spondylosis without myelopathy or radiculopathy, lumbar region: Secondary | ICD-10-CM | POA: Diagnosis not present

## 2022-05-07 DIAGNOSIS — M1711 Unilateral primary osteoarthritis, right knee: Secondary | ICD-10-CM | POA: Diagnosis not present

## 2022-05-11 DIAGNOSIS — M47816 Spondylosis without myelopathy or radiculopathy, lumbar region: Secondary | ICD-10-CM | POA: Diagnosis not present

## 2022-05-11 DIAGNOSIS — M5416 Radiculopathy, lumbar region: Secondary | ICD-10-CM | POA: Diagnosis not present

## 2022-05-27 ENCOUNTER — Ambulatory Visit: Payer: Medicare HMO | Admitting: Infectious Disease

## 2022-05-27 DIAGNOSIS — M5451 Vertebrogenic low back pain: Secondary | ICD-10-CM | POA: Diagnosis not present

## 2022-06-05 ENCOUNTER — Ambulatory Visit: Payer: Self-pay

## 2022-06-05 ENCOUNTER — Ambulatory Visit
Admission: EM | Admit: 2022-06-05 | Discharge: 2022-06-05 | Disposition: A | Discharge status: Home / Self Care | Payer: Medicare HMO

## 2022-06-05 DIAGNOSIS — S80821A Blister (nonthermal), right lower leg, initial encounter: Secondary | ICD-10-CM | POA: Diagnosis not present

## 2022-06-05 NOTE — Discharge Instructions (Addendum)
At this time, believe that the blister is related to the process of healing of the deep laceration that you sustained on your right lower leg.  I have enclosed some information about blisters that I hope you find helpful.    I recommend that you keep your blister covered with soft gauze until it either reabsorbed on its own or it opens and drains.    Please monitor the area for increased pain, surrounding redness or swelling and let us know these occur.  Thank you for visiting urgent care today.

## 2022-06-05 NOTE — ED Provider Notes (Signed)
[facility]    CSN: [id number] Arrival date & time: 06/05/22  6734    HISTORY   Chief Complaint  Patient presents with   Wound Check    Follow up to injury in September where stitches were placed. There is a swelling near the incision now. - Entered by patient   Abscess   HPI [name] is a pleasant, 81 y.o. male who presents to urgent care today. Patient complains of cute onset of a large fluid-filled blister proximal to the scar below his right knee that he acquired after lacerating his right lower leg against a bed frame March 17, 2022.  Stitches were placed few hours after the initial injury and were removed on April 01, 2022.  Patient was also provided with a prescription for doxycycline during his visit on March 17, 2022 which he states he completed as prescribed.  Patient states the lesion is not painful, came in today because he was not sure what to do about it.  The history is provided by the patient.   History reviewed. No pertinent past medical history. There are no problems to display for this patient.  History reviewed. No pertinent surgical history.  Home Medications    Prior to Admission medications   Not on File    Family History History reviewed. No pertinent family history. Social History   Allergies   Patient has no allergy information on record.  Review of Systems Review of Systems Pertinent findings revealed after performing a 14 point review of systems has been noted in the history of present illness.  Physical Exam Triage Vital Signs ED Triage Vitals  Enc Vitals Group     BP 05/16/21 0827 (!) 147/82     Pulse Rate 05/16/21 0827 72     Resp 05/16/21 0827 18     Temp 05/16/21 0827 98.3 F (36.8 C)     Temp Source 05/16/21 0827 Oral     SpO2 05/16/21 0827 98 %     Weight --      Height --      Head Circumference --      Peak Flow --      Pain Score 05/16/21 0826 5     Pain Loc --      Pain Edu? --      Excl. in  GC? --   No data found.  Updated Vital Signs BP 137/88 (BP Location: Right Arm)   Pulse (!) 53   Temp (!) 97.5 F (36.4 C) (Oral)   Resp 16   SpO2 95%   Physical Exam Vitals and nursing note reviewed.  Constitutional:      General: He is not in acute distress.    Appearance: Normal appearance. He is normal weight. He is not ill-appearing.  HENT:     Head: Normocephalic and atraumatic.  Eyes:     Extraocular Movements: Extraocular movements intact.     Conjunctiva/sclera: Conjunctivae normal.     Pupils: Pupils are equal, round, and reactive to light.  Cardiovascular:     Rate and Rhythm: Normal rate and regular rhythm.  Pulmonary:     Effort: Pulmonary effort is normal.     Breath sounds: Normal breath sounds.  Musculoskeletal:        General: Normal range of motion.     Cervical back: Normal range of motion and neck supple.  Skin:    General: Skin is warm and dry.     Comments: 2 x 3 cm  blister filled with serosanguineous fluid without surrounding erythema or induration.  Neurological:     General: No focal deficit present.     Mental Status: He is alert and oriented to person, place, and time. Mental status is at baseline.  Psychiatric:        Mood and Affect: Mood normal.        Behavior: Behavior normal.        Thought Content: Thought content normal.        Judgment: Judgment normal.     Visual Acuity Right Eye Distance:   Left Eye Distance:   Bilateral Distance:    Right Eye Near:   Left Eye Near:    Bilateral Near:     UC [facility]ouse / Diagnostics / Procedures:     Radiology No results found.  Procedures Procedures (including critical care time) EKG  Pending results:  Labs Reviewed - No data to display  Medications Ordered in UC: Medications - No data to display  UC Diagnoses / Final Clinical Impressions(s)   I have reviewed the triage vital signs and the nursing notes.  Pertinent labs & imaging results that were available during my care of the  patient were reviewed by me and considered in my medical decision making (see chart for details).    Final diagnoses:  Friction blister of leg, right, initial encounter   Patient advised to keep his blister covered with soft gauze till it either reabsorbs or drains, to resist the urge to pop it and drain it, to monitor it for signs of cutaneous infection and to follow-up as needed.  ED Prescriptions   None    PDMP not reviewed this encounter.  Pending results:  Labs Reviewed - No data to display  Discharge Instructions:   Discharge Instructions      At this time, believe that the blister is related to the process of healing of the deep laceration that you sustained on your right lower leg.  I have enclosed some information about blisters that I hope you find helpful.    I recommend that you keep your blister covered with soft gauze until it either reabsorbed on its own or it opens and drains.    Please monitor the area for increased pain, surrounding redness or swelling and let us know these occur.  Thank you for visiting urgent care today.      Disposition Upon Discharge:  Condition: stable for discharge home  Patient presented with an acute illness with associated systemic symptoms and significant discomfort requiring urgent management. In my opinion, this is a condition that a prudent lay person (someone who possesses an average knowledge of health and medicine) may potentially expect to result in complications if not addressed urgently such as respiratory distress, impairment of bodily function or dysfunction of bodily organs.   Routine symptom specific, illness specific and/or disease specific instructions were discussed with the patient and/or caregiver at length.   As such, the patient has been evaluated and assessed, work-up was performed and treatment was provided in alignment with urgent care protocols and evidence based medicine.  Patient/parent/caregiver has  been advised that the patient may require follow up for further testing and treatment if the symptoms continue in spite of treatment, as clinically indicated and appropriate.  Patient/parent/caregiver has been advised to return to the [facility] or PCP if no better; to PCP or the Emergency Department if new signs and symptoms develop, or if the current signs or symptoms continue to change or  worsen for further workup, evaluation and treatment as clinically indicated and appropriate  The patient will follow up with their current PCP if and as advised. If the patient does not currently have a PCP we will assist them in obtaining one.   The patient may need specialty follow up if the symptoms continue, in spite of conservative treatment and management, for further workup, evaluation, consultation and treatment as clinically indicated and appropriate.   Patient/parent/caregiver verbalized understanding and agreement of plan as discussed.  All questions were addressed during visit.  Please see discharge instructions below for further details of plan.  This office note has been dictated using Teaching laboratory technician.  Unfortunately, this method of dictation can sometimes lead to typographical or grammatical errors.  I apologize for your inconvenience in advance if this occurs.  Please do not hesitate to reach out to me if clarification is needed.      Theadora Rama Scales, PA-C 06/05/22 1142

## 2022-06-05 NOTE — ED Triage Notes (Addendum)
The patient c/o blister beneath right knee.  Started: last night   Home interventions: none

## 2022-06-15 DIAGNOSIS — C44329 Squamous cell carcinoma of skin of other parts of face: Secondary | ICD-10-CM | POA: Diagnosis not present

## 2022-06-15 DIAGNOSIS — L570 Actinic keratosis: Secondary | ICD-10-CM | POA: Diagnosis not present

## 2022-06-15 DIAGNOSIS — Z85828 Personal history of other malignant neoplasm of skin: Secondary | ICD-10-CM | POA: Diagnosis not present

## 2022-06-16 DIAGNOSIS — M5136 Other intervertebral disc degeneration, lumbar region: Secondary | ICD-10-CM | POA: Diagnosis not present

## 2022-06-19 ENCOUNTER — Ambulatory Visit: Payer: Medicare HMO | Admitting: Cardiology

## 2022-06-22 ENCOUNTER — Encounter: Payer: Self-pay | Admitting: Infectious Disease

## 2022-06-22 DIAGNOSIS — R9431 Abnormal electrocardiogram [ECG] [EKG]: Secondary | ICD-10-CM | POA: Insufficient documentation

## 2022-06-22 HISTORY — DX: Abnormal electrocardiogram (ECG) (EKG): R94.31

## 2022-06-22 NOTE — Progress Notes (Deleted)
Subjective:  Complaint follow-up for mycobacterial infection   Patient ID: [name], male    DOB: 1941-07-09, 81 y.o.   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 Martinique and has had history of basal cell carcinoma squamous cell carcinoma.  On August 19, 2021 he underwent Mohs procedure to right medial zygoma squamous cell carcinoma.  This was successfully cured but unfortunately he developed a lesion there that has been subsequently biopsied and found to have granulomas and mycobacteria seen on stain.  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  Susceptibilities are shown below though these were prior to macrolide S being known and organism was macrolide sensitive     We ended up starting clofazimine 2 tablets daily with follow-up with cardiology and review of EKG that showed no worsening of her QT prolongation and then we added azithromycin 500 mg daily again with follow-up with cardiology and stability on EKG if QT.  Says he is not suffering from any adverse effects from the antibiotics and he feels the lesion on his face is shrinking somewhat.  He did have concerns re a pedunculated area that was present before when I saw him and does not appear related to his infection.  Past Medical History:  Diagnosis Date   Ascending aorta dilatation (HCC)    58m by chest CT angio 04/2018   CAD (coronary artery disease)    a. 05/2006 Abnl stress test w/ 2-362mST dep; b. 05/2006 Cath/PCI: LM nl, LAD 30-40ost, 20-30p, 90d (small), D1 nl, D2 nl, LCX nl, OM1/2 nl, RCA Ca2+, 9027m(3.5x16 Liberty BMS & 3.5x12 Liberty BMS), EF 50%.   Chronic diastolic [place] ([place]estive [place]) [place]   a. 10/2017 Echo: EF 50-55%, mild AI, sev dil LA, mod dil RA.   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 CHA2DS2VASc 5-->Eliquis; b. Prev WCT on flecainide;  c. 12/2017 s/p DCCV.   Pneumothorax    Ventricular tachycardia (HCC)    a. possibly proarrhythmia from flecainide.    Past Surgical History:  Procedure Laterality Date   CARDIOVERSION N/A 12/23/2017   Procedure: CARDIOVERSION;  Surgeon: TurSueanne MargaritaD;  [facility];  Service: Cardiovascular;  Laterality: N/A;   CARDIOVERSION N/A 03/08/2018   Procedure: CARDIOVERSION;  Surgeon: TurSueanne MargaritaD;  [facility] ENDOSCOPY;  Service: Cardiovascular;  Laterality: N/A;   INGUINAL HERNIA REPAIR     x2   left total hip surgery     TEE WITHOUT CARDIOVERSION N/A 03/08/2018   Procedure: TRANSESOPHAGEAL ECHOCARDIOGRAM (TEE);  Surgeon: TurSueanne MargaritaD;  [facility];  Service: Cardiovascular;  Laterality: N/A;   TOTAL HIP ARTHROPLASTY Right 08/19/2020   Procedure: TOTAL HIP ARTHROPLASTY ANTERIOR APPROACH;  Surgeon: AluGaynelle ArabianD;  [facility] ORS;  Service: Orthopedics;  Laterality: Right;  100m52m TOTAL KNEE ARTHROPLASTY Left 02/28/2018   Procedure: LEFT TOTAL KNEE ARTHROPLASTY;  Surgeon: AluiGaynelle Arabian;  Location: [facility] ORS;  Service: Orthopedics;  Laterality: Left;   VEIN LIGATION AND STRIPPING Left 04/08/2020   Procedure: LEFT LOWER EXTREMITY VEIN LIGATION AND EXCISION OF ULCER;  Surgeon: DickScot Dock  Judeth Cornfield, MD;  Location[facility];  Service: Vascular;  Laterality: Left;    Family History  Problem Relation Age of Onset   Coronary artery disease Other       Social History   Socioeconomic History   Marital status: Single    Spouse name: Not on file   Number of children: Not on file   Years of education: Not on file   Highest education level: Not on file  Occupational History   Occupation:  retired  Tobacco Use   Smoking status: Not on file   Smokeless tobacco: Never  Vaping Use   Vaping Use: Never used  Substance and Sexual Activity   Alcohol use: Not Currently    Comment: occsaoinal   Drug use: Not Currently    Types: Other-see comments   Sexual activity: Not Currently  Other Topics Concern   Not on file  Social History Narrative   ** Merged History Encounter **       Lives in [place] by himself but 2 dtrs nearby and help out when necessary.   Social Determinants of Health   Financial Resource Strain: Not on file  Food Insecurity: Not on file  Transportation Needs: Not on file  Physical Activity: Not on file  Stress: Not on file  Social Connections: Not on file    No Known Allergies   Current Outpatient Medications:    acetaminophen (TYLENOL) 325 MG tablet, Take 2 tablets (650 mg total) by mouth every 6 (six) hours as needed for moderate pain., Disp: 30 tablet, Rfl: 0   amiodarone (PACERONE) 200 MG tablet, Take 1 tablet (200 mg total) by mouth daily., Disp: 90 tablet, Rfl: 3   amiodarone (PACERONE) 200 MG tablet, Take 200 mg by mouth daily., Disp: , Rfl:    apixaban (ELIQUIS) 5 MG TABS tablet, Take 1 tablet (5 mg total) by mouth 2 (two) times daily., Disp: 180 tablet, Rfl: 1   ascorbic acid (VITAMIN C) 500 MG tablet, Take 500 mg by mouth daily., Disp: , Rfl:    atorvastatin (LIPITOR) 20 MG tablet, Take 1 tablet (20 mg total) by mouth daily., Disp: 90 tablet, Rfl: 3   atorvastatin (LIPITOR) 20 MG tablet, Take 20 mg by mouth daily., Disp: , Rfl:    azithromycin (ZITHROMAX) 500 MG tablet, Take 1 tablet (500 mg total) by mouth daily., Disp: 30 tablet, Rfl: 11   Boswellia Serrata (BOSWELLIA PO), Take 1,200 mg by mouth daily., Disp: , Rfl:    carvedilol (COREG) 3.125 MG tablet, Take by mouth., Disp: , Rfl:    carvedilol (COREG) 3.125 MG tablet, Take by mouth., Disp: , Rfl:    doxycycline (VIBRAMYCIN) 100 MG capsule, Take 1 capsule (100 mg total) by mouth 2 (two)  times daily., Disp: 14 capsule, Rfl: 0   ELIQUIS 5 MG TABS tablet, Take 5 mg by mouth 2 (two) times daily., Disp: , Rfl:    furosemide (LASIX) 20 MG tablet, Take 1 tablet (20 mg total) by mouth every other day., Disp: 45 tablet, Rfl: 3   furosemide (LASIX) 20 MG tablet, Take 20 mg by mouth at bedtime., Disp: , Rfl:    lidocaine (LIDODERM) 5 %, Place 1 patch onto the skin daily., Disp: , Rfl:    Misc Natural Products (GLUCOSAMINE CHONDROITIN TRIPLE) TABS, Take 1 tablet by mouth daily., Disp: , Rfl:    Multiple Vitamin (MULTIVITAMIN WITH MINERALS) TABS tablet, Take 1 tablet by mouth daily., Disp: , Rfl:    Omega-3 Fatty Acids (  FISH OIL) 1200 MG CAPS, Take 1,200 mg by mouth daily., Disp: , Rfl:    PRESCRIPTION MEDICATION, Take 2 tablets by mouth daily., Disp: , Rfl:     Review of Systems     Objective:   Physical Exam  Lesion on zygoma 12/16/2021:    02/23/2022:        Assessment & Plan:   Mycobacterium chelonae infection:  We will check safety labs in the form of a CBC with differential and CMP and will continue on his clofazimine and azithromycin   Fortunately he is not immunosuppressed  Ventricular tachycardia history of atrial fibrillation on amiodarone and Eliquis  QT prolongation: QT stable

## 2022-06-23 DIAGNOSIS — M533 Sacrococcygeal disorders, not elsewhere classified: Secondary | ICD-10-CM | POA: Diagnosis not present

## 2022-06-24 ENCOUNTER — Ambulatory Visit: Payer: Medicare HMO | Admitting: Infectious Disease

## 2022-06-24 DIAGNOSIS — A318 Other mycobacterial infections: Secondary | ICD-10-CM

## 2022-06-24 DIAGNOSIS — I4819 Other persistent atrial fibrillation: Secondary | ICD-10-CM

## 2022-06-24 DIAGNOSIS — R9431 Abnormal electrocardiogram [ECG] [EKG]: Secondary | ICD-10-CM

## 2022-06-24 DIAGNOSIS — I10 Essential (primary) hypertension: Secondary | ICD-10-CM

## 2022-07-01 ENCOUNTER — Other Ambulatory Visit: Payer: Self-pay | Admitting: Surgery

## 2022-07-01 DIAGNOSIS — I7121 Aneurysm of the ascending aorta, without rupture: Secondary | ICD-10-CM

## 2022-07-02 ENCOUNTER — Telehealth: Payer: Self-pay | Admitting: Pharmacist

## 2022-07-02 NOTE — Telephone Encounter (Signed)
2 bottles of clofazimine are located in the pharmacy office when patient needs a refill.  Yunuen Mordan L. Emonte Dieujuste, PharmD, BCIDP, AAHIVP, CPP Clinical Pharmacist Practitioner Infectious Diseases [place] [facility] 07/02/2022, 4:48 PM

## 2022-07-08 DIAGNOSIS — M533 Sacrococcygeal disorders, not elsewhere classified: Secondary | ICD-10-CM | POA: Diagnosis not present

## 2022-07-31 ENCOUNTER — Telehealth: Payer: Self-pay

## 2022-07-31 ENCOUNTER — Telehealth: Payer: Self-pay | Admitting: Pharmacist

## 2022-07-31 DIAGNOSIS — M1711 Unilateral primary osteoarthritis, right knee: Secondary | ICD-10-CM | POA: Diagnosis not present

## 2022-07-31 NOTE — Telephone Encounter (Signed)
Left patient a voice mail to call back to schedule a follow up with Dr. Tommy Medal, ok to schedule in March

## 2022-07-31 NOTE — Telephone Encounter (Signed)
Patient picked up 2 bottles of clofazimine on 07/31/22. Supply should last for ~3 months. Next refill due around the middle/end of April.  Naresh Althaus L. Mandeep Kiser, PharmD, BCIDP, AAHIVP, CPP Clinical Pharmacist Practitioner Infectious Diseases [place] [facility] 07/31/2022, 11:14 AM

## 2022-08-04 DIAGNOSIS — C4442 Squamous cell carcinoma of skin of scalp and neck: Secondary | ICD-10-CM | POA: Diagnosis not present

## 2022-08-04 DIAGNOSIS — Z85828 Personal history of other malignant neoplasm of skin: Secondary | ICD-10-CM | POA: Diagnosis not present

## 2022-08-04 DIAGNOSIS — C44329 Squamous cell carcinoma of skin of other parts of face: Secondary | ICD-10-CM | POA: Diagnosis not present

## 2022-08-12 ENCOUNTER — Other Ambulatory Visit: Payer: Medicare HMO

## 2022-08-12 ENCOUNTER — Ambulatory Visit: Payer: Medicare HMO | Admitting: Surgery

## 2022-08-14 ENCOUNTER — Other Ambulatory Visit: Payer: Medicare HMO

## 2022-08-25 DIAGNOSIS — M5451 Vertebrogenic low back pain: Secondary | ICD-10-CM | POA: Diagnosis not present

## 2022-08-26 ENCOUNTER — Ambulatory Visit: Payer: Medicare HMO | Admitting: Surgery

## 2022-08-27 ENCOUNTER — Encounter: Payer: Self-pay | Admitting: Surgery

## 2022-08-27 ENCOUNTER — Telehealth: Payer: Self-pay | Admitting: Cardiology

## 2022-08-27 NOTE — Telephone Encounter (Signed)
See Mychart message. Also left detailed message on voice mail per DPR.

## 2022-08-27 NOTE — Telephone Encounter (Signed)
Pt c/o medication issue:  1. Name of Medication:   apixaban (ELIQUIS) 5 MG TABS tablet   2. How are you currently taking this medication (dosage and times per day)?  As prescribed  3. Are you having a reaction (difficulty breathing--STAT)?   4. What is your medication issue?    Patient stated he takes Eliquis but wants to know if he can take over-the-counter Naproxen for a few days for his back pain.

## 2022-08-27 NOTE — Telephone Encounter (Signed)
Would prefer pt take Tylenol instead if possible. NSAIDs not ideal given pt's history of CAD, HFpEF, HTN, and elevated SCr, plus he's on Eliquis. If Tylenol is ineffective, he should use lowest effective dose of naproxen for short duration.

## 2022-08-28 ENCOUNTER — Ambulatory Visit
Admission: RE | Admit: 2022-08-28 | Discharge: 2022-08-28 | Disposition: A | Discharge status: Home / Self Care | Payer: Medicare HMO | Source: Ambulatory Visit | Attending: Surgery | Admitting: Surgery

## 2022-08-28 DIAGNOSIS — I2510 Atherosclerotic heart disease of native coronary artery without angina pectoris: Secondary | ICD-10-CM | POA: Diagnosis not present

## 2022-08-28 DIAGNOSIS — I712 Thoracic aortic aneurysm, without rupture, unspecified: Secondary | ICD-10-CM | POA: Diagnosis not present

## 2022-08-28 DIAGNOSIS — K449 Diaphragmatic hernia without obstruction or gangrene: Secondary | ICD-10-CM | POA: Diagnosis not present

## 2022-08-28 DIAGNOSIS — I3139 Other pericardial effusion (noninflammatory): Secondary | ICD-10-CM | POA: Diagnosis not present

## 2022-08-28 DIAGNOSIS — I7121 Aneurysm of the ascending aorta, without rupture: Secondary | ICD-10-CM

## 2022-08-28 MED ORDER — IOPAMIDOL (ISOVUE-370) INJECTION 76%: 60.0000 mL | Freq: Once | INTRAVENOUS | Status: DC | PRN

## 2022-08-28 MED ORDER — IOPAMIDOL (ISOVUE-370) INJECTION 76%
75.0000 mL | Freq: Once | INTRAVENOUS | Status: AC | PRN
  Administered 2022-08-28: 75 mL via INTRAVENOUS

## 2022-09-02 ENCOUNTER — Encounter: Payer: Self-pay | Admitting: Surgery

## 2022-09-02 ENCOUNTER — Ambulatory Visit: Payer: Medicare HMO | Admitting: Surgery

## 2022-09-02 VITALS — BP 170/85 | HR 65 | Resp 20 | Ht 74.0 in | Wt 158.0 lb

## 2022-09-02 DIAGNOSIS — I7121 Aneurysm of the ascending aorta, without rupture: Secondary | ICD-10-CM

## 2022-09-02 NOTE — Progress Notes (Signed)
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  He denies any chest or back pain.  He said no shortness of breath.  He has been getting scheduled injections in his back for low back pain which do not seem to be helping.  Current Outpatient Medications  Medication Sig Dispense Refill   acetaminophen (TYLENOL) 325 MG tablet Take 2 tablets (650 mg total) by mouth every 6 (six) hours as needed for moderate pain. 30 tablet 0   amiodarone (PACERONE) 200 MG tablet Take 1 tablet (200 mg total) by mouth daily. 90 tablet 3   amiodarone (PACERONE) 200 MG tablet Take 200 mg by mouth daily.     apixaban (ELIQUIS) 5 MG TABS tablet Take 1 tablet (5 mg total) by mouth 2 (two) times daily. 180 tablet 1   ascorbic acid (VITAMIN C) 500 MG tablet Take 500 mg by mouth daily.     atorvastatin (LIPITOR) 20 MG tablet Take 1 tablet (20 mg total) by mouth daily. 90 tablet 3   atorvastatin (LIPITOR) 20 MG tablet Take 20 mg by mouth daily.     azithromycin (ZITHROMAX) 500 MG tablet Take 1 tablet (500 mg total) by mouth daily. 30 tablet 11   Boswellia Serrata (BOSWELLIA PO) Take 1,200 mg by mouth daily.     carvedilol (COREG) 3.125 MG tablet Take by mouth.     carvedilol (COREG) 3.125 MG tablet Take by mouth.     doxycycline (VIBRAMYCIN) 100 MG capsule Take 1 capsule (100 mg total) by mouth 2 (two) times daily. 14 capsule 0   ELIQUIS 5 MG TABS tablet Take 5 mg by mouth 2 (two) times daily.     furosemide (LASIX) 20 MG tablet Take 1 tablet (20 mg total) by mouth every other day. 45 tablet 3   furosemide (LASIX) 20 MG tablet Take 20 mg by mouth at bedtime.     lidocaine (LIDODERM) 5 % Place 1 patch onto the skin daily.     Misc Natural Products (GLUCOSAMINE  CHONDROITIN TRIPLE) TABS Take 1 tablet by mouth daily.     Multiple Vitamin (MULTIVITAMIN WITH MINERALS) TABS tablet Take 1 tablet by mouth daily.     Omega-3 Fatty Acids (FISH OIL) 1200 MG CAPS Take 1,200 mg by mouth daily.     PRESCRIPTION MEDICATION Take 2 tablets by mouth daily.     No current facility-administered medications for this visit.     Physical Exam: BP (!) 170/85   Pulse 65   Resp 20   Ht 6' 2"$  (1.88 m)   Wt 158 lb (71.7 kg)   SpO2 95% Comment: RA  BMI 20.29 kg/m  He looks well. Cardiac exam shows a regularly irregular rhythm with normal heart sounds.  There is no murmur. Lungs are clear. There is no peripheral edema.  Diagnostic Tests:  Narrative & Impression  CLINICAL DATA:  Aortic aneurysm suspected   EXAM: CT ANGIOGRAPHY CHEST WITH CONTRAST   TECHNIQUE: Multidetector CT imaging of the chest was performed using the standard protocol during bolus administration of intravenous contrast. Multiplanar CT image reconstructions and MIPs were obtained to evaluate the vascular anatomy.   RADIATION DOSE REDUCTION: This exam was performed according to the departmental dose-optimization program which includes automated exposure control, adjustment of the mA and/or kV  according to patient size and/or use of iterative reconstruction technique.   CONTRAST:  6m ISOVUE-370 IOPAMIDOL (ISOVUE-370) INJECTION 76%   COMPARISON:  Multiple priors, most recent chest CT dated July 23, 2021   FINDINGS: Cardiovascular: Normal heart size. Trace pericardial effusion. Severe coronary artery calcifications. Aortic valve calcifications. Aneurysmal dilation of the ascending thoracic aorta measuring 4.5 x 4.3 cm, unchanged when compared with the prior exam. Moderate atherosclerotic disease of the thoracic aorta. Standard three-vessel aortic arch.   Mediastinum/Nodes: Small hiatal hernia. Thyroid is unremarkable. No pathologically enlarged lymph nodes seen in the chest.    Lungs/Pleura: Central airways are patent. No consolidation, pleural effusion or pneumothorax.   Upper Abdomen: Gallstones with no evidence of gallbladder wall thickening. Small low-attenuation lesion of the right lobe of the liver, likely benign given stability on multiple priors. Adenoma of the right adrenal gland measuring up to 2.9 cm. Possible enhancement of posterior left kidney lesion measuring up to 1.5 cm on series 4 image 108. Tiny fluid containing subxiphoid hernias.   Musculoskeletal: Dextrocurvature of the lumbar spine. No acute osseous abnormality.   Review of the MIP images confirms the above findings.   IMPRESSION: 1. Aneurysmal dilation of the ascending thoracic aorta measuring 4.5, unchanged when compared with the prior exam. Ascending thoracic aortic aneurysm. Recommend semi-annual imaging followup by CTA or MRA and referral to cardiothoracic surgery if not already obtained. This recommendation follows 2010 ACCF/AHA/AATS/ACR/ASA/SCA/SCAI/SIR/STS/SVM Guidelines for the Diagnosis and Management of Patients With Thoracic Aortic Disease. Circulation. 2010; 121:[id number] Aortic aneurysm NOS (ICD10-I71.9) 2. Possible enhancement of posterior left kidney lesion measuring up to 1.5 cm. Recommend renal protocol CT or MRI for further evaluation. 3. 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     Electronically Signed   By: LYetta GlassmanM.D.   On: 08/28/2022 13:06      Impression:  This 82year old gentleman has a stable 4.5 cm fusiform ascending aortic aneurysm. This is well below the surgical threshold of 5.5 cm. I reviewed the CTA images with him and answered all of his questions. I stressed the importance of continued good blood pressure control in preventing further enlargement and acute aortic dissection. I advised him against doing any heavy lifting that may require a Valsalva maneuver that could suddenly raise his blood  pressure to high levels.   Plan:  I will see him back in 1 year with a CTA of the chest.   [name] MD [facility] [phone]

## 2022-09-15 ENCOUNTER — Ambulatory Visit
Admission: EM | Admit: 2022-09-15 | Discharge: 2022-09-15 | Disposition: A | Discharge status: Home / Self Care | Payer: Medicare HMO

## 2022-09-15 DIAGNOSIS — S81812A Laceration without foreign body, left lower leg, initial encounter: Secondary | ICD-10-CM | POA: Diagnosis not present

## 2022-09-15 DIAGNOSIS — M79662 Pain in left lower leg: Secondary | ICD-10-CM

## 2022-09-15 NOTE — ED Provider Notes (Signed)
[facility] URGENT [facility]ARE [facility]  Note:  This document was prepared using Systems analyst and may include unintentional dictation errors.  MRN: [id number] DOB: Dec 07, 1940  Subjective:   [name] is a 82 y.o. male presenting for suffering left lower leg laceration today.  Patient accidentally hit his left lower leg against a car door.  Wiped his wound, applied a dressing and came straight to our clinic.  Tdap is up-to-date.  No current facility-administered medications for this encounter.  Current Outpatient Medications:    acetaminophen (TYLENOL) 325 MG tablet, Take 2 tablets (650 mg total) by mouth every 6 (six) hours as needed for moderate pain., Disp: 30 tablet, Rfl: 0   amiodarone (PACERONE) 200 MG tablet, Take 1 tablet (200 mg total) by mouth daily., Disp: 90 tablet, Rfl: 3   amiodarone (PACERONE) 200 MG tablet, Take 200 mg by mouth daily., Disp: , Rfl:    apixaban (ELIQUIS) 5 MG TABS tablet, Take 1 tablet (5 mg total) by mouth 2 (two) times daily., Disp: 180 tablet, Rfl: 1   ascorbic acid (VITAMIN C) 500 MG tablet, Take 500 mg by mouth daily., Disp: , Rfl:    atorvastatin (LIPITOR) 20 MG tablet, Take 1 tablet (20 mg total) by mouth daily., Disp: 90 tablet, Rfl: 3   atorvastatin (LIPITOR) 20 MG tablet, Take 20 mg by mouth daily., Disp: , Rfl:    azithromycin (ZITHROMAX) 500 MG tablet, Take 1 tablet (500 mg total) by mouth daily., Disp: 30 tablet, Rfl: 11   Boswellia Serrata (BOSWELLIA PO), Take 1,200 mg by mouth daily., Disp: , Rfl:    carvedilol (COREG) 3.125 MG tablet, Take by mouth., Disp: , Rfl:    carvedilol (COREG) 3.125 MG tablet, Take by mouth., Disp: , Rfl:    doxycycline (VIBRAMYCIN) 100 MG capsule, Take 1 capsule (100 mg total) by mouth 2 (two) times daily., Disp: 14 capsule, Rfl: 0   ELIQUIS 5 MG TABS tablet, Take 5 mg by mouth 2 (two) times daily., Disp: , Rfl:    furosemide (LASIX) 20 MG tablet, Take 1 tablet (20 mg total) by mouth every  other day., Disp: 45 tablet, Rfl: 3   furosemide (LASIX) 20 MG tablet, Take 20 mg by mouth at bedtime., Disp: , Rfl:    lidocaine (LIDODERM) 5 %, Place 1 patch onto the skin daily., Disp: , Rfl:    Misc Natural Products (GLUCOSAMINE CHONDROITIN TRIPLE) TABS, Take 1 tablet by mouth daily., Disp: , Rfl:    Multiple Vitamin (MULTIVITAMIN WITH MINERALS) TABS tablet, Take 1 tablet by mouth daily., Disp: , Rfl:    Omega-3 Fatty Acids (FISH OIL) 1200 MG CAPS, Take 1,200 mg by mouth daily., Disp: , Rfl:    PRESCRIPTION MEDICATION, Take 2 tablets by mouth daily., Disp: , Rfl:    No Known Allergies  Past Medical History:  Diagnosis Date   Ascending aorta dilatation ([place])    58m by chest CT angio 04/2018   CAD (coronary artery disease)    a. 05/2006 Abnl stress test w/ 2-375mST dep; b. 05/2006 Cath/PCI: LM nl, LAD 30-40ost, 20-30p, 90d (small), D1 nl, D2 nl, LCX nl, OM1/2 nl, RCA Ca2+, 9015m(3.5x16 Liberty BMS & 3.5x12 Liberty BMS), EF 50%.   Chronic diastolic CHF (congestive heart failure) ([place]  a. 10/2017 Echo: EF 50-55%, mild AI, sev dil LA, mod dil RA.   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 CHA2DS2VASc 5-->Eliquis; b. Prev WCT on flecainide;  c. 12/2017 s/p DCCV.   Pneumothorax    Prolonged Q-T interval on ECG 06/22/2022   Ventricular tachycardia (HCC)    a. possibly proarrhythmia from flecainide.     Past Surgical History:  Procedure Laterality Date   CARDIOVERSION N/A 12/23/2017   Procedure: CARDIOVERSION;  Surgeon: Sueanne Margarita, MD;  [facility];  Service: Cardiovascular;  Laterality: N/A;   CARDIOVERSION N/A 03/08/2018   Procedure: CARDIOVERSION;  Surgeon: Sueanne Margarita, MD;  [facility]: [facility] ENDOSCOPY;  Service: Cardiovascular;  Laterality: N/A;   INGUINAL HERNIA REPAIR     x2   left total hip surgery     TEE WITHOUT CARDIOVERSION N/A 03/08/2018   Procedure:  TRANSESOPHAGEAL ECHOCARDIOGRAM (TEE);  Surgeon: Sueanne Margarita, MD;  [facility] END[facility]COPY;  Service: Cardiovascular;  Laterality: N/A;   TOTAL HIP ARTHROPLASTY Right 08/19/2020   Procedure: TOTAL HIP ARTHROPLASTY ANTERIOR APPROACH;  Surgeon: Gaynelle Arabian, MD;  Location[facility] ORS;  Service: Orthopedics;  Laterality: Right;  128mn   TOTAL KNEE ARTHROPLASTY Left 02/28/2018   Procedure: LEFT TOTAL KNEE ARTHROPLASTY;  Surgeon: AGaynelle Arabian MD;  Location: [facility] ORS;  Service: Orthopedics;  Laterality: Left;   VEIN LIGATION AND STRIPPING Left 04/08/2020   Procedure: LEFT LOWER EXTREMITY VEIN LIGATION AND EXCISION OF ULCER;  Surgeon: DAngelia Mould MD;  [place]  Service: Vascular;  Laterality: Left;    Family History  Problem Relation Age of Onset   Coronary artery disease Other     Social History   Tobacco Use   Smoking status: Never   Smokeless tobacco: Never  Vaping Use   Vaping Use: Never used  Substance Use Topics   Alcohol use: Not Currently    Comment: occsaoinal   Drug use: Not Currently    Types: Other-see comments    ROS   Objective:   Vitals: BP (!) 145/88 (BP Location: Left Arm)   Pulse 65   Temp 98.1 F (36.7 C) (Oral)   Resp 18   SpO2 98%   Physical Exam Constitutional:      General: He is not in acute distress.    Appearance: Normal appearance. He is well-developed and normal weight. He is not ill-appearing, toxic-appearing or diaphoretic.  HENT:     Head: Normocephalic and atraumatic.     Right Ear: External ear normal.     Left Ear: External ear normal.     Nose: Nose normal.     Mouth/Throat:     Pharynx: Oropharynx is clear.  Eyes:     General: No scleral icterus.       Right eye: No discharge.        Left eye: No discharge.     Extraocular Movements: Extraocular movements intact.  Cardiovascular:     Rate and Rhythm: Normal rate.  Pulmonary:     Effort: Pulmonary effort is normal.  Musculoskeletal:     Cervical back: Normal  range of motion.       Legs:  Neurological:     Mental Status: He is alert and oriented to person, place, and time.  Psychiatric:        Mood and Affect: Mood normal.        Behavior: Behavior normal.        Thought Content: Thought content normal.        Judgment: Judgment normal.     PROCEDURE NOTE: laceration repair Verbal  consent obtained from patient.  Local anesthesia with 30cc Lidocaine 2% with epinephrine.  Wound explored for tendon, ligament damage. Wound scrubbed with soap and water and rinsed. Wound closed with #20 4-0 Ethilon (mixture of simple interrupted and horizontal mattress) sutures.  Wound cleansed and dressed.   Assessment and Plan :   PDMP not reviewed this encounter.  1. Pain in left lower leg   2. Laceration of left lower leg, initial encounter     Laceration repaired successfully. Wound care reviewed. Recommended Tylenol and/or ibuprofen for pain control. Return-to-clinic precautions discussed, patient verbalized understanding. Otherwise, follow up in 14 days for suture removal. Counseled patient on potential for adverse effects with medications prescribed/recommended today, ER and return-to-clinic precautions discussed, patient verbalized understanding.    Jaynee Eagles, [place] 09/15/22 1919

## 2022-09-15 NOTE — ED Triage Notes (Signed)
Pt hit right lower leg on car door with skin laceration.

## 2022-09-15 NOTE — Discharge Instructions (Signed)
WOUND CARE Please return in 14 days to have your stitches/staples removed or sooner if you have concerns.  Keep area clean and dry for 24 hours. Do not remove bandage, if applied.  After 24 hours, remove bandage and wash wound gently with mild soap and warm water. Reapply a new bandage after cleaning wound, if directed.  Continue daily cleansing with soap and water until stitches/staples are removed.  Do not apply any ointments or creams to the wound while stitches/staples are in place, as this may cause delayed healing.  Notify the office if you experience any of the following signs of infection: Swelling, redness, pus drainage, streaking, fever >101.0 F  Notify the office if you experience excessive bleeding that does not stop after 15-20 minutes of constant, firm pressure.

## 2022-09-19 ENCOUNTER — Other Ambulatory Visit: Payer: Self-pay | Admitting: Cardiology

## 2022-09-22 DIAGNOSIS — M5416 Radiculopathy, lumbar region: Secondary | ICD-10-CM | POA: Diagnosis not present

## 2022-09-29 ENCOUNTER — Ambulatory Visit
Admission: RE | Admit: 2022-09-29 | Discharge: 2022-09-29 | Disposition: A | Discharge status: Home / Self Care | Payer: Medicare HMO | Source: Ambulatory Visit | Attending: Urgent Care | Admitting: Urgent Care

## 2022-09-29 VITALS — BP 130/76 | HR 78 | Temp 98.2°F | Resp 18

## 2022-09-29 DIAGNOSIS — Z4802 Encounter for removal of sutures: Secondary | ICD-10-CM

## 2022-09-29 DIAGNOSIS — L089 Local infection of the skin and subcutaneous tissue, unspecified: Secondary | ICD-10-CM

## 2022-09-29 DIAGNOSIS — S81812D Laceration without foreign body, left lower leg, subsequent encounter: Secondary | ICD-10-CM

## 2022-09-29 MED ORDER — CEPHALEXIN 500 MG PO CAPS: 500.0000 mg | ORAL_CAPSULE | Freq: Three times a day (TID) | ORAL | 0 refills | Status: DC

## 2022-09-29 NOTE — ED Triage Notes (Signed)
Pt here for suture removal to LLE.

## 2022-09-29 NOTE — Discharge Instructions (Signed)
Prop your leg up the next 2-3 days. Do light activities only. Tomorrow morning you can remove the dressing off your leg. Clean the wound with plain soap and water. In general let the wound breathe, keep dry. No ointments or creams. If you are going to expose the wound to dirty environments then cover the wound.

## 2022-09-29 NOTE — ED Notes (Signed)
Suture removal completed with [place], [place].

## 2022-09-29 NOTE — ED Triage Notes (Signed)
Pt with redness and swelling to RLE.

## 2022-09-29 NOTE — ED Provider Notes (Addendum)
[facility] - URGENT [facility]ARE C[facility]  Note:  This document was prepared using Systems analyst and may include unintentional dictation errors.  MRN: [id number] DOB: Apr 15, 1941  Subjective:   [name] is a 82 y.o. male presenting for recheck on his wound and suture removal.  Patient underwent laceration repair 09/15/2022.  Refer to the note from that visit for more details.  He has tried to keep the wound clean and covered.  He has had minimal pain but notes that there is more redness and swelling.  No drainage of pus or bleeding.  Patient does have a history of venous insufficiency.  No current facility-administered medications for this encounter.  Current Outpatient Medications:    acetaminophen (TYLENOL) 325 MG tablet, Take 2 tablets (650 mg total) by mouth every 6 (six) hours as needed for moderate pain., Disp: 30 tablet, Rfl: 0   amiodarone (PACERONE) 200 MG tablet, TAKE 1 TABLET(200 MG) BY MOUTH DAILY, Disp: 90 tablet, Rfl: 1   apixaban (ELIQUIS) 5 MG TABS tablet, Take 1 tablet (5 mg total) by mouth 2 (two) times daily., Disp: 180 tablet, Rfl: 1   ascorbic acid (VITAMIN C) 500 MG tablet, Take 500 mg by mouth daily., Disp: , Rfl:    atorvastatin (LIPITOR) 20 MG tablet, Take 1 tablet (20 mg total) by mouth daily., Disp: 90 tablet, Rfl: 3   atorvastatin (LIPITOR) 20 MG tablet, Take 20 mg by mouth daily., Disp: , Rfl:    azithromycin (ZITHROMAX) 500 MG tablet, Take 1 tablet (500 mg total) by mouth daily., Disp: 30 tablet, Rfl: 11   Boswellia Serrata (BOSWELLIA PO), Take 1,200 mg by mouth daily., Disp: , Rfl:    carvedilol (COREG) 3.125 MG tablet, Take by mouth., Disp: , Rfl:    carvedilol (COREG) 3.125 MG tablet, Take by mouth., Disp: , Rfl:    doxycycline (VIBRAMYCIN) 100 MG capsule, Take 1 capsule (100 mg total) by mouth 2 (two) times daily., Disp: 14 capsule, Rfl: 0   ELIQUIS 5 MG TABS tablet, Take 5 mg by mouth 2 (two) times daily., Disp: , Rfl:    furosemide  (LASIX) 20 MG tablet, Take 1 tablet (20 mg total) by mouth every other day., Disp: 45 tablet, Rfl: 3   furosemide (LASIX) 20 MG tablet, Take 20 mg by mouth at bedtime., Disp: , Rfl:    lidocaine (LIDODERM) 5 %, Place 1 patch onto the skin daily., Disp: , Rfl:    Misc Natural Products (GLUCOSAMINE CHONDROITIN TRIPLE) TABS, Take 1 tablet by mouth daily., Disp: , Rfl:    Multiple Vitamin (MULTIVITAMIN WITH MINERALS) TABS tablet, Take 1 tablet by mouth daily., Disp: , Rfl:    Omega-3 Fatty Acids (FISH OIL) 1200 MG CAPS, Take 1,200 mg by mouth daily., Disp: , Rfl:    PRESCRIPTION MEDICATION, Take 2 tablets by mouth daily., Disp: , Rfl:    No Known Allergies  Past Medical History:  Diagnosis Date   Ascending aorta dilatation ([place])    65m by chest CT angio 04/2018   CAD (coronary artery disease)    a. 05/2006 Abnl stress test w/ 2-353mST dep; b. 05/2006 Cath/PCI: LM nl, LAD 30-40ost, 20-30p, 90d (small), D1 nl, D2 nl, LCX nl, OM1/2 nl, RCA Ca2+, 906m(3.5x16 Liberty BMS & 3.5x12 Liberty BMS), EF 50%.   Chronic diastolic CHF (congestive heart failure) ([place]  a. 10/2017 Echo: EF 50-55%, mild AI, sev dil LA, mod dil RA.   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 CHA2DS2VASc 5-->Eliquis; b. Prev WCT on flecainide;  c. 12/2017 s/p DCCV.   Pneumothorax    Prolonged Q-T interval on ECG 06/22/2022   Ventricular tachycardia (H[facility])    a. possibly proarrhythmia from flecainide.     Past Surgical History:  Procedure Laterality Date   CARDIOVERSION N/A 12/23/2017   Procedure: CARDIOVERSION;  Surgeon: Sueanne Margarita, MD;  Location: [facility] ENDOSCOPY;  Service: Cardiovascular;  Laterality: N/A;   CARDIOVERSION N/A 03/08/2018   Procedure: CARDIOVERSION;  Surgeon: Sueanne Margarita, MD;  Location: [facility] ENDOSCOPY;  Service: Cardiovascular;  Laterality: N/A;   INGUINAL HERNIA REPAIR     x2   left total hip  surgery     TEE WITHOUT CARDIOVERSION N/A 03/08/2018   Procedure: TRANSESOPHAGEAL ECHOCARDIOGRAM (TEE);  Surgeon: Sueanne Margarita, MD;  Location[facility] ENDOSCOPY;  Service: Cardiovascular;  Laterality: N/A;   TOTAL HIP ARTHROPLASTY Right 08/19/2020   Procedure: TOTAL HIP ARTHROPLASTY ANTERIOR APPROACH;  Surgeon: Gaynelle Arabian, MD;  Location: [facility] ORS;  Service: Orthopedics;  Laterality: Right;  16mn   TOTAL KNEE ARTHROPLASTY Left 02/28/2018   Procedure: LEFT TOTAL KNEE ARTHROPLASTY;  Surgeon: AGaynelle Arabian MD;  Location: [facility] ORS;  Service: Orthopedics;  Laterality: Left;   VEIN LIGATION AND STRIPPING Left 04/08/2020   Procedure: LEFT LOWER EXTREMITY VEIN LIGATION AND EXCISION OF ULCER;  Surgeon: DAngelia Mould MD;  Location: [facility]  Service: Vascular;  Laterality: Left;    Family History  Problem Relation Age of Onset   Coronary artery disease Other     Social History   Tobacco Use   Smoking status: Never   Smokeless tobacco: Never  Vaping Use   Vaping Use: Never used  Substance Use Topics   Alcohol use: Not Currently    Comment: occsaoinal   Drug use: Not Currently    Types: Other-see comments    ROS   Objective:   Vitals: BP 130/76 (BP Location: Left Arm)   Pulse 78   Temp 98.2 F (36.8 C) (Oral)   Resp 18   SpO2 98%   Physical Exam Constitutional:      General: He is not in acute distress.    Appearance: Normal appearance. He is well-developed and normal weight. He is not ill-appearing, toxic-appearing or diaphoretic.  HENT:     Head: Normocephalic and atraumatic.     Right Ear: External ear normal.     Left Ear: External ear normal.     Nose: Nose normal.     Mouth/Throat:     Pharynx: Oropharynx is clear.  Eyes:     General: No scleral icterus.       Right eye: No discharge.        Left eye: No discharge.     Extraocular Movements: Extraocular movements intact.  Cardiovascular:     Rate and Rhythm: Normal rate.  Pulmonary:     Effort: Pulmonary  effort is normal.  Musculoskeletal:     Cervical back: Normal range of motion.       Legs:  Neurological:     Mental Status: He is alert and oriented to person, place, and time.  Psychiatric:        Mood and Affect: Mood normal.        Behavior: Behavior normal.        Thought Content: Thought content normal.        Judgment:  Judgment normal.    Sutures removed without incident.  Wound cleansed, bacitracin applied to the wound, covered with nonadherent dressing and secured with Coban.    Assessment and Plan :   PDMP not reviewed this encounter.  1. Laceration without foreign body, left lower leg, subsequent encounter   2. Wound infection   3. Encounter for removal of sutures     Creatinine Clearance calculated at 75mL/min using his creatinine level from 2023.  Patient would like to cover for secondary wound infection.  I was agreeable and we will use Keflex for 7 days.  Discussed wound care. Counseled patient on potential for adverse effects with medications prescribed/recommended today, ER and return-to-clinic precautions discussed, patient verbalized understanding.    Jaynee Eagles, PA-C 09/29/22 1531    Jaynee Eagles, PA-C 10/07/22 1243

## 2022-10-08 DIAGNOSIS — M1711 Unilateral primary osteoarthritis, right knee: Secondary | ICD-10-CM | POA: Diagnosis not present

## 2022-10-16 DIAGNOSIS — M1711 Unilateral primary osteoarthritis, right knee: Secondary | ICD-10-CM | POA: Diagnosis not present

## 2022-10-22 DIAGNOSIS — M1711 Unilateral primary osteoarthritis, right knee: Secondary | ICD-10-CM | POA: Diagnosis not present

## 2022-10-27 DIAGNOSIS — M5451 Vertebrogenic low back pain: Secondary | ICD-10-CM | POA: Diagnosis not present

## 2022-11-02 ENCOUNTER — Telehealth: Payer: Self-pay | Admitting: Pharmacist

## 2022-11-02 DIAGNOSIS — M5136 Other intervertebral disc degeneration, lumbar region: Secondary | ICD-10-CM | POA: Diagnosis not present

## 2022-11-02 DIAGNOSIS — A318 Other mycobacterial infections: Secondary | ICD-10-CM

## 2022-11-02 MED ORDER — CLOFAZIMINE 50 MG PO CAPS - FOR COMPASSIONATE USE: 100.0000 mg | ORAL_CAPSULE | Freq: Every day | ORAL | 1 refills | Status: DC

## 2022-11-02 NOTE — Telephone Encounter (Signed)
2 bottles of clofazimine are located in the pharmacy office. He must see Dr. Daiva Eves before any refills will be given to him.  [name], PharmD, BCIDP, AAHIVP, CPP Clinical Pharmacist Practitioner Infectious Diseases Clinical Pharmacist [facility] 11/02/2022, 3:42 PM

## 2022-11-10 ENCOUNTER — Encounter: Payer: Self-pay | Admitting: Internal Medicine

## 2022-11-10 ENCOUNTER — Ambulatory Visit (INDEPENDENT_AMBULATORY_CARE_PROVIDER_SITE_OTHER): Payer: Medicare HMO | Admitting: Internal Medicine

## 2022-11-10 ENCOUNTER — Other Ambulatory Visit: Payer: Self-pay

## 2022-11-10 VITALS — BP 150/47 | HR 94 | Temp 97.3°F | Wt 150.2 lb

## 2022-11-10 DIAGNOSIS — H6123 Impacted cerumen, bilateral: Secondary | ICD-10-CM | POA: Diagnosis not present

## 2022-11-10 DIAGNOSIS — A318 Other mycobacterial infections: Secondary | ICD-10-CM

## 2022-11-10 DIAGNOSIS — H9192 Unspecified hearing loss, left ear: Secondary | ICD-10-CM

## 2022-11-10 NOTE — Progress Notes (Signed)
Patient ID: [name], male   DOB: 06/11/1941, 82 y.o.   MRN: [id number]  HPI A patient of Dr Zenaida Niece Dam's who he previously treated for m.chelonae facial lesion from  right zygomatic arch region. Currently on azithromycin and clofazamine.  dermatology by Dr. [place] and has had history of basal cell carcinoma squamous cell carcinoma.  On August 19, 2021 he underwent Mohs procedure to right medial zygoma squamous cell carcinoma.  This was successfully cured but unfortunately he developed a lesion there that has been subsequently biopsied and found to have granulomas and mycobacteria seen on stain.  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 Anticipate that he will need right knee replacement ( hx of left TKA and bilateral hip THA)  Outpatient Encounter Medications as of 11/10/2022  Medication Sig   acetaminophen (TYLENOL) 325 MG tablet Take 2 tablets (650 mg total) by mouth every 6 (six) hours as needed for moderate pain.   amiodarone (PACERONE) 200 MG tablet TAKE 1 TABLET(200 MG) BY MOUTH DAILY   apixaban (ELIQUIS) 5 MG TABS tablet Take 1 tablet (5 mg total) by mouth 2 (two) times daily.   ascorbic acid (VITAMIN C) 500 MG tablet Take 500 mg by mouth daily.   atorvastatin (LIPITOR) 20 MG tablet Take 1 tablet (20 mg total) by mouth daily.   atorvastatin (LIPITOR) 20 MG tablet Take 20 mg by mouth daily.   azithromycin (ZITHROMAX) 500 MG tablet Take 1 tablet (500 mg total) by mouth daily.   Boswellia Serrata (BOSWELLIA PO) Take 1,200 mg by mouth daily.   carvedilol (COREG) 3.125 MG tablet Take by mouth.   carvedilol (COREG) 3.125 MG tablet Take by mouth.   cephALEXin (KEFLEX)  500 MG capsule Take 1 capsule (500 mg total) by mouth 3 (three) times daily.   clofazimine 50 mg CAPS capsule (for compassionate use) Take 2 capsules (100 mg total) by mouth daily with breakfast.   doxycycline (VIBRAMYCIN) 100 MG capsule Take 1 capsule (100 mg total) by mouth 2 (two) times daily.   ELIQUIS 5 MG TABS tablet Take 5 mg by mouth 2 (two) times daily.   furosemide (LASIX) 20 MG tablet Take 1 tablet (20 mg total) by mouth every other day.   furosemide (LASIX) 20 MG tablet Take 20 mg by mouth at bedtime.   lidocaine (LIDODERM) 5 % Place 1 patch onto the skin daily.   Misc Natural Products (GLUCOSAMINE CHONDROITIN TRIPLE) TABS Take 1 tablet by mouth daily.   Multiple Vitamin (MULTIVITAMIN WITH MINERALS) TABS tablet Take 1 tablet by mouth daily.   Omega-3 Fatty Acids (FISH OIL) 1200 MG CAPS Take 1,200 mg by mouth daily.   PRESCRIPTION MEDICATION Take 2 tablets by mouth daily.   No facility-administered encounter medications on file as of 11/10/2022.     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   OA (osteoarthritis) of hip 08/19/2020   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   Bilateral lower extremity edema 05/24/2018   Persistent atrial fibrillation 03/16/2018   Thoracic aortic aneurysm    (  HFpEF) heart failure with preserved ejection fraction    Coronary artery disease involving native coronary artery of native heart without angina pectoris 06/26/2013   Ventricular tachycardia    Chronic venous insufficiency    Hyperlipidemia LDL goal <70 06/08/2011   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 He appears well-developed and well-nourished. No distress.  HENT: occluded TM due to cerumen bilaterally Mouth/Throat: Oropharynx is clear and moist. No oropharyngeal exudate.  Cardiovascular: Normal rate, regular rhythm and normal heart sounds. Exam reveals no gallop and no friction rub.  No murmur heard.  Pulmonary/Chest: Effort normal and breath sounds normal. No respiratory distress. He has no wheezes.  Abdominal: Soft. Bowel sounds are normal. He exhibits no distension. There is no tenderness.  Lymphadenopathy:  He has no cervical adenopathy.  Neurological: He is alert and oriented to person, place, and time.  Skin: Skin is warm and dry. No rash noted. No erythema.  Psychiatric: He has a normal mood and affect. His behavior is normal.    CBC Lab Results  Component Value Date   WBC 6.2 02/23/2022   RBC 4.12 (L) 02/23/2022   HGB 11.0 (L) 02/23/2022   HCT 34.1 (L) 02/23/2022   PLT 208 02/23/2022   MCV 82.8 02/23/2022   MCH 26.7 (L) 02/23/2022   MCHC 32.3 02/23/2022   RDW 16.7 (H) 02/23/2022   LYMPHSABS 825 (L) 02/23/2022   EOSABS 81 02/23/2022    BMET Lab Results  Component Value Date   NA 140 02/23/2022   K 4.1 02/23/2022   CL 104 02/23/2022   CO2 29 02/23/2022   GLUCOSE 96 02/23/2022   BUN 15 02/23/2022   CREATININE 1.24 (H) 02/23/2022   CALCIUM 8.6 02/23/2022   GFRNONAA >60 08/21/2020   GFRAA >60 04/08/2020      Assessment and Plan Impacted cerumen maybe cause of decrease hearing - recommend OTC rinse with 50/50 room temp water and hydrogen peroxide for rinsing of ears. If not improved then recommend a  Hearing test = to see if related to azithromycin side effect vs. Age related  Labs to see tolerates  NTM infection SSTI with m.chelonae = continue to azithromycin and clofazamine  Plan to see back in 2 mo. with dr. Daiva Eves or myself

## 2022-11-11 ENCOUNTER — Telehealth: Payer: Self-pay | Admitting: Pharmacist

## 2022-11-11 LAB — BASIC METABOLIC PANEL
BUN: 13 mg/dL (ref 7–25)
CO2: 30 mmol/L (ref 20–32)
Calcium: 8.7 mg/dL (ref 8.6–10.3)
Chloride: 106 mmol/L (ref 98–110)
Creat: 1.2 mg/dL (ref 0.70–1.22)
Glucose, Bld: 88 mg/dL (ref 65–99)
Potassium: 4.1 mmol/L (ref 3.5–5.3)
Sodium: 141 mmol/L (ref 135–146)

## 2022-11-11 LAB — CBC WITH DIFFERENTIAL/PLATELET
Absolute Monocytes: 550 {cells}/uL (ref 200–950)
Basophils Absolute: 32 {cells}/uL (ref 0–200)
Basophils Relative: 0.5 %
Eosinophils Absolute: 173 {cells}/uL (ref 15–500)
Eosinophils Relative: 2.7 %
HCT: 32.6 % — ABNORMAL LOW (ref 38.5–50.0)
Hemoglobin: 10.4 g/dL — ABNORMAL LOW (ref 13.2–17.1)
Lymphs Abs: 749 {cells}/uL — ABNORMAL LOW (ref 850–3900)
MCH: 26.9 pg — ABNORMAL LOW (ref 27.0–33.0)
MCHC: 31.9 g/dL — ABNORMAL LOW (ref 32.0–36.0)
MCV: 84.2 fL (ref 80.0–100.0)
MPV: 10.4 fL (ref 7.5–12.5)
Monocytes Relative: 8.6 %
Neutro Abs: 4896 {cells}/uL (ref 1500–7800)
Neutrophils Relative %: 76.5 %
Platelets: 257 Thousand/uL (ref 140–400)
RBC: 3.87 Million/uL — ABNORMAL LOW (ref 4.20–5.80)
RDW: 15.2 % — ABNORMAL HIGH (ref 11.0–15.0)
Total Lymphocyte: 11.7 %
WBC: 6.4 Thousand/uL (ref 3.8–10.8)

## 2022-11-11 LAB — C-REACTIVE PROTEIN: CRP: <3 mg/L

## 2022-11-11 LAB — SEDIMENTATION RATE: Sed Rate: 17 mm/h (ref 0–20)

## 2022-11-11 NOTE — Telephone Encounter (Signed)
Patient picked up 2 bottles of clofazimine on 11/10/22. Supply should last for ~3 months. If patient needs another refill, it will be due around middle/end of July.  Shayden Bobier L. Jerris Keltz, PharmD, BCIDP, AAHIVP, CPP Clinical Pharmacist Practitioner Infectious Diseases Clinical Pharmacist [facility] 11/11/2022, 10:42 AM

## 2022-11-16 DIAGNOSIS — M5451 Vertebrogenic low back pain: Secondary | ICD-10-CM | POA: Diagnosis not present

## 2022-11-17 DIAGNOSIS — H6123 Impacted cerumen, bilateral: Secondary | ICD-10-CM | POA: Diagnosis not present

## 2022-11-17 DIAGNOSIS — H938X2 Other specified disorders of left ear: Secondary | ICD-10-CM | POA: Diagnosis not present

## 2022-11-18 DIAGNOSIS — M5451 Vertebrogenic low back pain: Secondary | ICD-10-CM | POA: Diagnosis not present

## 2022-11-23 DIAGNOSIS — M5451 Vertebrogenic low back pain: Secondary | ICD-10-CM | POA: Diagnosis not present

## 2022-11-26 DIAGNOSIS — H938X2 Other specified disorders of left ear: Secondary | ICD-10-CM | POA: Diagnosis not present

## 2022-11-26 DIAGNOSIS — H6122 Impacted cerumen, left ear: Secondary | ICD-10-CM | POA: Diagnosis not present

## 2022-11-27 DIAGNOSIS — M5451 Vertebrogenic low back pain: Secondary | ICD-10-CM | POA: Diagnosis not present

## 2022-12-03 DIAGNOSIS — M5416 Radiculopathy, lumbar region: Secondary | ICD-10-CM | POA: Diagnosis not present

## 2022-12-11 ENCOUNTER — Other Ambulatory Visit: Payer: Self-pay | Admitting: Cardiology

## 2022-12-11 MED ORDER — FUROSEMIDE 20 MG PO TABS: 20.0000 mg | ORAL_TABLET | ORAL | 3 refills | Status: DC

## 2022-12-11 NOTE — Addendum Note (Signed)
Addended by: Laurance Flatten on: 12/11/2022 03:51 PM   Modules accepted: Orders

## 2022-12-15 ENCOUNTER — Other Ambulatory Visit: Payer: Self-pay | Admitting: Cardiology

## 2022-12-16 DIAGNOSIS — M5451 Vertebrogenic low back pain: Secondary | ICD-10-CM | POA: Diagnosis not present

## 2023-01-05 DIAGNOSIS — M5416 Radiculopathy, lumbar region: Secondary | ICD-10-CM | POA: Diagnosis not present

## 2023-01-11 ENCOUNTER — Telehealth: Payer: Self-pay | Admitting: Cardiology

## 2023-01-11 NOTE — Telephone Encounter (Signed)
Had patient check his bottle of oxycodone to ensure it was not a combination drug that included tylenol.

## 2023-01-11 NOTE — Telephone Encounter (Signed)
Pt c/o medication issue:  1. Name of Medication: Eliquis  2. How are you currently taking this medication (dosage and times per day)?   3. Are you having a reaction (difficulty breathing--STAT)?   4. What is your medication issue? Patient wants to know if he can take an over the counter pain medicine  with Eliquis? n

## 2023-01-11 NOTE — Telephone Encounter (Signed)
Reviewed with patient that it is safe for him to take tylenol with his eliquis. Patient verbalizes understanding and states he will discuss further with his doctor at [facility] tomorrow as he has an appt.

## 2023-01-11 NOTE — Telephone Encounter (Signed)
Yes Tylenol is ok to take.

## 2023-01-11 NOTE — Telephone Encounter (Signed)
Patient recently had steroid injections at [facility] for his lower back, he doesn't remember what they injected him with but feels they did not work. He states he has a prescription for oxycodone but is wondering if he can also take tylenol or another OTC pain relief medication as he has increased pain on exertion unrelieved by oxycodone. Forwarded to Dr. Mayford Knife and PharmD.

## 2023-01-12 DIAGNOSIS — M1711 Unilateral primary osteoarthritis, right knee: Secondary | ICD-10-CM | POA: Diagnosis not present

## 2023-01-25 ENCOUNTER — Emergency Department (HOSPITAL_COMMUNITY): Payer: Medicare HMO

## 2023-01-25 ENCOUNTER — Other Ambulatory Visit: Payer: Self-pay

## 2023-01-25 ENCOUNTER — Encounter (HOSPITAL_COMMUNITY): Payer: Self-pay | Admitting: Emergency Medicine

## 2023-01-25 ENCOUNTER — Emergency Department (HOSPITAL_COMMUNITY)
Admission: EM | Admit: 2023-01-25 | Discharge: 2023-01-25 | Disposition: A | Discharge status: Home / Self Care | Payer: Medicare HMO | Attending: Emergency Medicine | Admitting: Emergency Medicine

## 2023-01-25 DIAGNOSIS — J9811 Atelectasis: Secondary | ICD-10-CM | POA: Diagnosis not present

## 2023-01-25 DIAGNOSIS — Z79899 Other long term (current) drug therapy: Secondary | ICD-10-CM | POA: Diagnosis not present

## 2023-01-25 DIAGNOSIS — R42 Dizziness and giddiness: Secondary | ICD-10-CM

## 2023-01-25 DIAGNOSIS — I5032 Chronic diastolic (congestive) heart failure: Secondary | ICD-10-CM | POA: Insufficient documentation

## 2023-01-25 DIAGNOSIS — I110 Hypertensive heart disease with heart failure: Secondary | ICD-10-CM | POA: Insufficient documentation

## 2023-01-25 DIAGNOSIS — H538 Other visual disturbances: Secondary | ICD-10-CM | POA: Diagnosis not present

## 2023-01-25 DIAGNOSIS — R5383 Other fatigue: Secondary | ICD-10-CM | POA: Diagnosis not present

## 2023-01-25 DIAGNOSIS — I2510 Atherosclerotic heart disease of native coronary artery without angina pectoris: Secondary | ICD-10-CM | POA: Diagnosis not present

## 2023-01-25 DIAGNOSIS — Z7901 Long term (current) use of anticoagulants: Secondary | ICD-10-CM | POA: Diagnosis not present

## 2023-01-25 DIAGNOSIS — R001 Bradycardia, unspecified: Secondary | ICD-10-CM

## 2023-01-25 LAB — CBC
HCT: 32.5 % — ABNORMAL LOW (ref 39.0–52.0)
Hemoglobin: 9.9 g/dL — ABNORMAL LOW (ref 13.0–17.0)
MCH: 27 pg (ref 26.0–34.0)
MCHC: 30.5 g/dL (ref 30.0–36.0)
MCV: 88.6 fL (ref 80.0–100.0)
Platelets: 185 10*3/uL (ref 150–400)
RBC: 3.67 MIL/uL — ABNORMAL LOW (ref 4.22–5.81)
RDW: 15.7 % — ABNORMAL HIGH (ref 11.5–15.5)
WBC: 5.8 10*3/uL (ref 4.0–10.5)
nRBC: 0 % (ref 0.0–0.2)

## 2023-01-25 LAB — BASIC METABOLIC PANEL
Anion gap: 7 (ref 5–15)
BUN: 12 mg/dL (ref 8–23)
CO2: 25 mmol/L (ref 22–32)
Calcium: 8.3 mg/dL — ABNORMAL LOW (ref 8.9–10.3)
Chloride: 106 mmol/L (ref 98–111)
Creatinine, Ser: 1.04 mg/dL (ref 0.61–1.24)
GFR, Estimated: >60 mL/min (ref >60)
Glucose, Bld: 94 mg/dL (ref 70–99)
Potassium: 4.8 mmol/L (ref 3.5–5.1)
Sodium: 138 mmol/L (ref 135–145)

## 2023-01-25 NOTE — Discharge Instructions (Addendum)
Follow-up with cardiology.  Also follow-up with your primary care doctor.

## 2023-01-25 NOTE — ED Triage Notes (Signed)
Pt BIB ems with complaints of dizziness that started yesterday and continues today. Denies any falls. States hearing is getting worse in left ear and has appt with ENT. Blurred vision in right eye pt states it has ben going on for "awhile" manly in the mornings and gets getter throughout the day.

## 2023-01-25 NOTE — ED Provider Notes (Addendum)
[facility]ENC[facility] DEPARTMENT AT [facility] Provider Note   CSN: [id number] Arrival date & time: 01/25/23  1231     History  Chief Complaint  Patient presents with   Dizziness    [name] is a 82 y.o. male.   Dizziness Patient presents with dizziness.  Lightheadedness.  Is had for a while now.  Also at times has blurred vision in his right eye.  Both he has been going for a while worse the last few days.  No chest pain.  No trouble breathing.  States the dizziness is not necessarily consistent.  Just feels lightheaded.  Does not feel room spinning.    Past Medical History:  Diagnosis Date   Ascending aorta dilatation ([facility])    44mm by chest CT angio 04/2018   CAD (coronary artery disease)    a. 05/2006 Abnl stress test w/ 2-27mm ST dep; b. 05/2006 Cath/PCI: LM nl, LAD 30-40ost, 20-30p, 90d (small), D1 nl, D2 nl, LCX nl, OM1/2 nl, RCA Ca2+, 42m/d (3.5x16 Liberty BMS & 3.5x12 Liberty BMS), EF 50%.   Chronic diastolic CHF (congestive heart failure) (HCC)    a. 10/2017 Echo: EF 50-55%, mild AI, sev dil LA, mod dil RA.   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 CHA2DS2VASc 5-->Eliquis; b. Prev WCT on flecainide;  c. 12/2017 s/p DCCV.   Pneumothorax    Prolonged Q-T interval on ECG 06/22/2022   Ventricular tachycardia (HCC)    a. possibly proarrhythmia from flecainide.    Home Medications Prior to Admission medications   Medication Sig Start Date End Date Taking? Authorizing Provider  acetaminophen (TYLENOL) 325 MG tablet Take 2 tablets (650 mg total) by mouth every 6 (six) hours as needed for moderate pain. 03/17/22   Wallis Bamberg, PA-C  amiodarone (PACERONE) 200 MG tablet TAKE 1 TABLET(200 MG) BY MOUTH DAILY 09/21/22   Quintella Reichert, MD  apixaban (ELIQUIS) 5 MG TABS tablet Take 1 tablet (5 mg total) by mouth 2 (two) times daily. 03/16/22   Quintella Reichert,  MD  ascorbic acid (VITAMIN C) 500 MG tablet Take 500 mg by mouth daily.    [provider]  atorvastatin (LIPITOR) 20 MG tablet Take 20 mg by mouth daily. 03/21/22   [provider]  atorvastatin (LIPITOR) 20 MG tablet TAKE 1 TABLET(20 MG) BY MOUTH DAILY 12/16/22   Quintella Reichert, MD  azithromycin (ZITHROMAX) 500 MG tablet Take 1 tablet (500 mg total) by mouth daily. 02/23/22   Randall Hiss, MD  Boswellia Serrata (BOSWELLIA PO) Take 1,200 mg by mouth daily.    [provider]  carvedilol (COREG) 3.125 MG tablet Take by mouth. 01/21/22   [provider]  carvedilol (COREG) 3.125 MG tablet Take by mouth. 01/21/22   [provider]  cephALEXin (KEFLEX) 500 MG capsule Take 1 capsule (500 mg total) by mouth 3 (three) times daily. 09/29/22   Wallis Bamberg, PA-C  clofazimine 50 mg CAPS capsule (for compassionate use) Take 2 capsules (100 mg total) by mouth daily with breakfast. 11/02/22   Kuppelweiser, Cassie L, RPH-CPP  doxycycline (VIBRAMYCIN) 100 MG capsule Take 1 capsule (100 mg total) by mouth 2 (two) times daily. 03/17/22   Wallis Bamberg, PA-C  ELIQUIS 5 MG TABS tablet Take 5 mg by mouth 2 (two) times daily. 03/17/22   [provider]  furosemide (LASIX) 20  MG tablet Take 1 tablet (20 mg total) by mouth every other day. 12/11/22 03/11/23  Meriam Sprague, MD  lidocaine (LIDODERM) 5 % Place 1 patch onto the skin daily.    [provider]  Misc Natural Products (GLUCOSAMINE CHONDROITIN TRIPLE) TABS Take 1 tablet by mouth daily.    [provider]  Multiple Vitamin (MULTIVITAMIN WITH MINERALS) TABS tablet Take 1 tablet by mouth daily.    [provider]  Omega-3 Fatty Acids (FISH OIL) 1200 MG CAPS Take 1,200 mg by mouth daily.    [provider]  PRESCRIPTION MEDICATION Take 2 tablets by mouth daily.    [provider]      Allergies    Patient has no known allergies.    Review of Systems   Review of  Systems  Neurological:  Positive for dizziness.    Physical Exam Updated Vital Signs BP (!) 169/96 (BP Location: Right Arm)   Pulse (!) 43   Temp 98.1 F (36.7 C)   Resp 16   Wt 67.6 kg   SpO2 100%   BMI 19.13 kg/m  Physical Exam Vitals reviewed.  Eyes:     Pupils: Pupils are equal, round, and reactive to light.  Cardiovascular:     Rate and Rhythm: Regular rhythm. Bradycardia present.  Abdominal:     Tenderness: There is no abdominal tenderness.  Musculoskeletal:        General: No tenderness.  Skin:    Capillary Refill: Capillary refill takes less than 2 seconds.  Neurological:     Mental Status: He is alert and oriented to person, place, and time.     ED Results / Procedures / Treatments   Labs (all labs ordered are listed, but only abnormal results are displayed) Labs Reviewed  BASIC METABOLIC PANEL - Abnormal; Notable for the following components:      Result Value   Calcium 8.3 (*)    All other components within normal limits  CBC - Abnormal; Notable for the following components:   RBC 3.67 (*)    Hemoglobin 9.9 (*)    HCT 32.5 (*)    RDW 15.7 (*)    All other components within normal limits  URINALYSIS, ROUTINE W REFLEX MICROSCOPIC  TSH  MAGNESIUM  CBG MONITORING, ED    EKG EKG Interpretation Date/Time:  Monday January 25 2023 12:45:13 EDT Ventricular Rate:  75 PR Interval:    QRS Duration:  92 QT Interval:  442 QTC Calculation: 493 R Axis:   71  Text Interpretation: atrial bigemingy Minimal voltage criteria for LVH, may be normal variant ( Cornell product ) Prolonged QT Abnormal ECG When compared with ECG of 14-Apr-2019 17:37, bigeminy is new Confirmed by Benjiman Core [phone]) on 01/25/2023 1:59:29 PM  Radiology DG Chest 2 View  Result Date: 01/25/2023 CLINICAL DATA:  Fatigue.  Dizziness this morning. EXAM: CHEST - 2 VIEW COMPARISON:  CT chest dated August 28, 2022. Chest x-ray dated July 03, 2009. FINDINGS: The patient is rotated to the  left. Normal heart size. The lungs remain hyperinflated. Minimal left basilar atelectasis. No focal consolidation, pleural effusion, or pneumothorax. No acute osseous abnormality. IMPRESSION: 1. No acute cardiopulmonary disease. Electronically Signed   By: Obie Dredge M.D.   On: 01/25/2023 14:29    Procedures Procedures    Medications Ordered in ED Medications - No data to display  ED Course/ Medical Decision Making/ A&P  Medical Decision Making Amount and/or Complexity of Data Reviewed Labs: ordered. Radiology: ordered.   Patient with dizziness.  Reviewing notes has history of history of same.  Has had bradycardia in the past.  Decreased his Coreg previously.  Still bradycardic now however.  EKG just showed bigeminy.  Will check basic blood work.  Will monitor.  No headache.  Has had some chronic vision change in the right eye for months now.  Reviewed previous cardiology note.  Care turned over to Dr. Rodena Medin  Patient not willing to stay for further evaluation.  Has had some bradycardia but is had similar episodes in the past.  Doubt severe long pauses since not having severe episodic events here.  However they were not able to get him on the monitor during his stay.  States he has things he has to take care of at home.  Will be leaving.  I think it is reasonable but his high risk status of monitoring here.  Will discharge.        Final Clinical Impression(s) / ED Diagnoses Final diagnoses:  Bradycardia  Dizziness    Rx / DC Orders ED Discharge Orders     None         Benjiman Core, MD 01/25/23 1515    Benjiman Core, MD 01/25/23 1529

## 2023-02-01 DIAGNOSIS — I480 Paroxysmal atrial fibrillation: Secondary | ICD-10-CM | POA: Diagnosis not present

## 2023-02-01 DIAGNOSIS — R42 Dizziness and giddiness: Secondary | ICD-10-CM | POA: Diagnosis not present

## 2023-02-01 DIAGNOSIS — D649 Anemia, unspecified: Secondary | ICD-10-CM | POA: Diagnosis not present

## 2023-02-01 DIAGNOSIS — I5032 Chronic diastolic (congestive) heart failure: Secondary | ICD-10-CM | POA: Diagnosis not present

## 2023-02-01 DIAGNOSIS — I77810 Thoracic aortic ectasia: Secondary | ICD-10-CM | POA: Diagnosis not present

## 2023-02-01 DIAGNOSIS — I700 Atherosclerosis of aorta: Secondary | ICD-10-CM | POA: Diagnosis not present

## 2023-02-01 DIAGNOSIS — H6123 Impacted cerumen, bilateral: Secondary | ICD-10-CM | POA: Diagnosis not present

## 2023-02-01 DIAGNOSIS — H6122 Impacted cerumen, left ear: Secondary | ICD-10-CM | POA: Diagnosis not present

## 2023-02-08 ENCOUNTER — Telehealth: Payer: Self-pay

## 2023-02-08 DIAGNOSIS — A318 Other mycobacterial infections: Secondary | ICD-10-CM

## 2023-02-08 NOTE — Telephone Encounter (Signed)
He has been on amio for awhile, so that is ok. I see most recent QTc was prolonged. Would probably prefer Dr. Drue Second weigh in regarding refilling right now. Dr Drue Second - what do you think?

## 2023-02-08 NOTE — Telephone Encounter (Signed)
Yes, ok to refill! I sent refill for clofazimine for him a few days ago but I have some extra if he needs it now.

## 2023-02-08 NOTE — Telephone Encounter (Signed)
Received refill request from [facility] on [address] for azithromycin.   Sandie Ano, RN

## 2023-02-09 ENCOUNTER — Other Ambulatory Visit (HOSPITAL_COMMUNITY): Payer: Self-pay

## 2023-02-09 ENCOUNTER — Telehealth: Payer: Self-pay

## 2023-02-09 NOTE — Telephone Encounter (Signed)
Pharmacy team working on alternative regimen.   Sandie Ano, RN

## 2023-02-09 NOTE — Telephone Encounter (Signed)
[facility] Patient Advocate Encounter   Received notification from [facility] that prior authorization for [name] is required.   PA submitted on 02/09/23 Key [id number] Status is pending    [facility] will continue to follow.   Clearance Coots, CPhT Specialty Pharmacy Patient [facility] Phone: [phone] Fax:  [phone]

## 2023-02-10 ENCOUNTER — Telehealth: Payer: Self-pay

## 2023-02-10 ENCOUNTER — Other Ambulatory Visit (HOSPITAL_COMMUNITY): Payer: Self-pay

## 2023-02-10 NOTE — Telephone Encounter (Signed)
Spoke with [name], we discussed that his care team would like to change his azithromycin to a different medication that won't affect his QT interval. Advised him per Dr. Drue Second to stop his azithromycin on Sunday. He will come in to the clinic to see Marchelle Folks on 7/30 to discuss further.   Sandie Ano, RN

## 2023-02-10 NOTE — Telephone Encounter (Signed)
Love it - thank you!

## 2023-02-10 NOTE — Telephone Encounter (Signed)
[facility] Patient Advocate Encounter  Prior Authorization for [name] has been approved.    PA# [id number] Effective dates: 07/20/22 through 07/20/23  Patients co-pay is $2,133.54.   There are no funds available for medicare part d patients.  [facility] will continue to follow.  Clearance Coots, CPhT Specialty Pharmacy Patient [facility] for Infectious Disease Phone: [phone] Fax:  [phone]

## 2023-02-11 ENCOUNTER — Telehealth: Payer: Self-pay | Admitting: Pharmacist

## 2023-02-11 NOTE — Telephone Encounter (Signed)
Spoke with patient's daughter Stark Klein today as she is listed as his secondary number and his primary number kept ringing busy earlier. Relayed importance of his appointment next week as he needs to pick up omadacycline samples which are only at our clinic. Discussed that I need to extensively counsel on the use of omadacycline. She will drive him to appointment and stay with him during appointment to ensure information is relayed clearly. Informed her to stop his azithromycin Sunday per Dr. Drue Second; she verbalized understanding.   Margarite Gouge, PharmD, CPP, BCIDP, AAHIVP Clinical Pharmacist Practitioner Infectious Diseases Clinical Pharmacist [facility]

## 2023-02-15 DIAGNOSIS — G894 Chronic pain syndrome: Secondary | ICD-10-CM | POA: Diagnosis not present

## 2023-02-15 DIAGNOSIS — M5416 Radiculopathy, lumbar region: Secondary | ICD-10-CM | POA: Diagnosis not present

## 2023-02-16 ENCOUNTER — Ambulatory Visit: Payer: Medicare HMO | Admitting: Pharmacist

## 2023-02-17 ENCOUNTER — Ambulatory Visit (INDEPENDENT_AMBULATORY_CARE_PROVIDER_SITE_OTHER): Payer: Medicare HMO | Admitting: Pharmacist

## 2023-02-17 ENCOUNTER — Telehealth: Payer: Self-pay | Admitting: Cardiology

## 2023-02-17 ENCOUNTER — Other Ambulatory Visit: Payer: Self-pay

## 2023-02-17 DIAGNOSIS — A318 Other mycobacterial infections: Secondary | ICD-10-CM

## 2023-02-17 IMAGING — CR DG HAND COMPLETE 3+V*R*
3 series · 3 of 3 positions shown · non-contrast
Comparison: None.

CLINICAL DATA: Pain and cold sensation second and third digits
right hand

EXAM:
RIGHT HAND - COMPLETE 3+ VIEW

[x hand pa right]
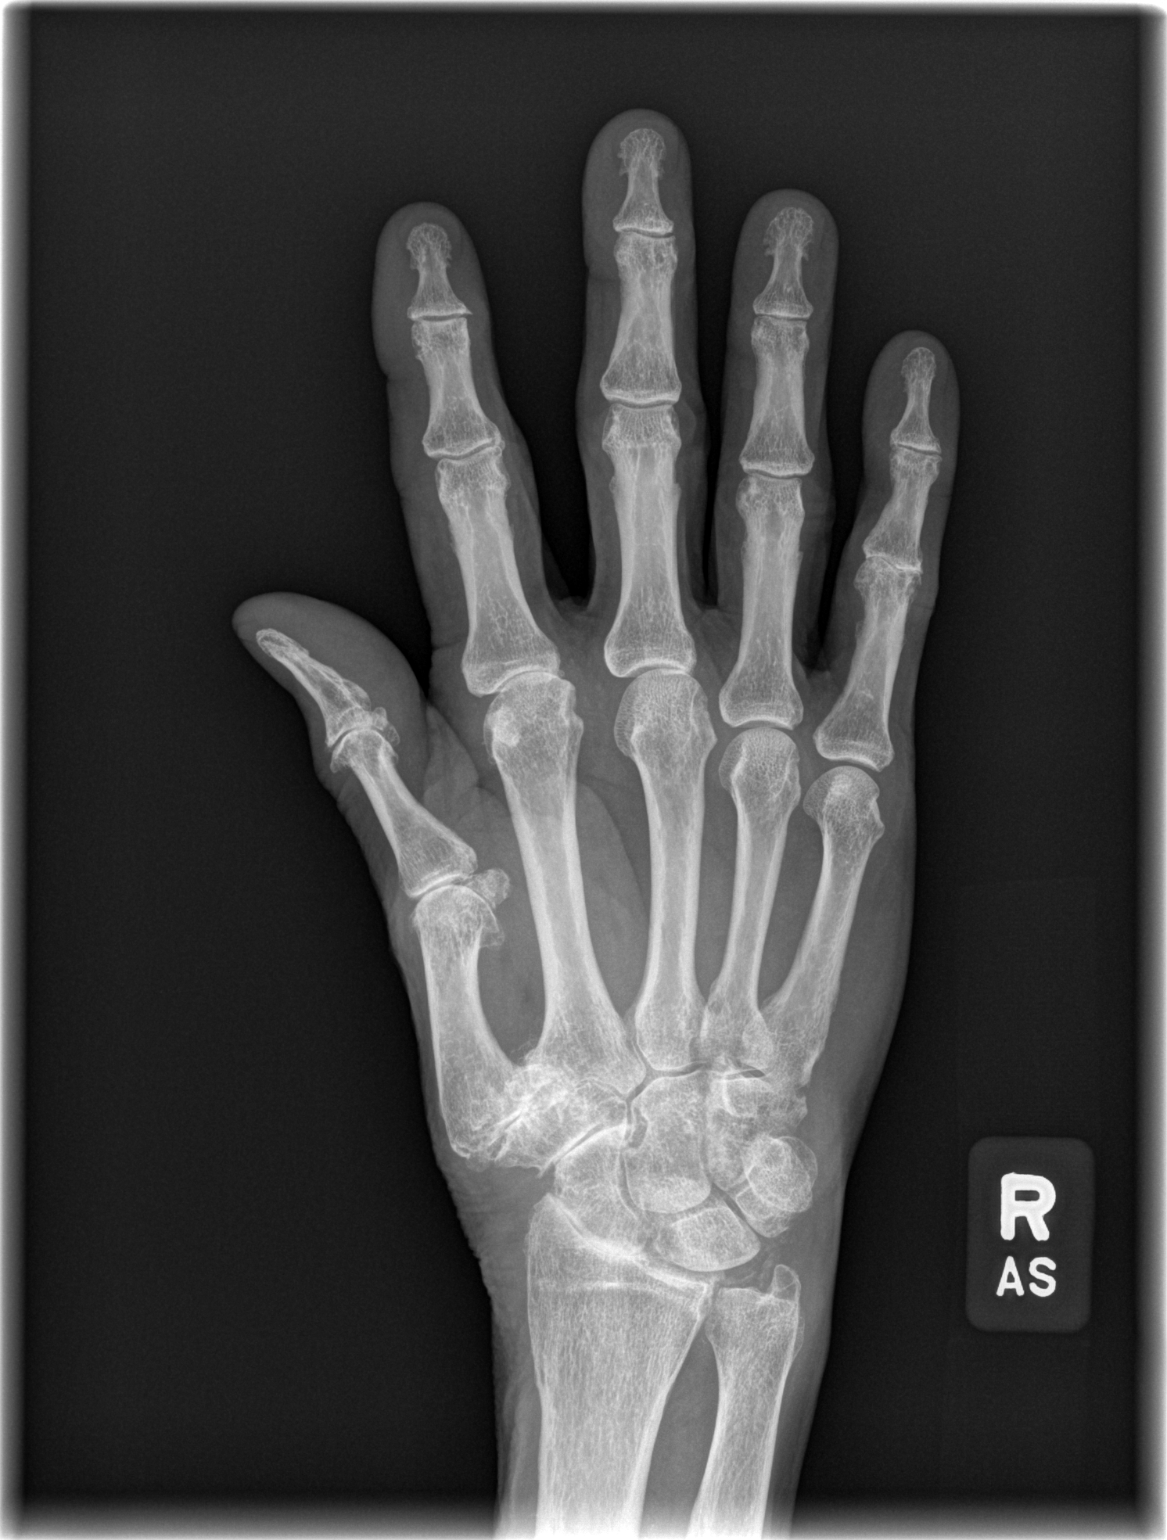

[x hand oblique right]
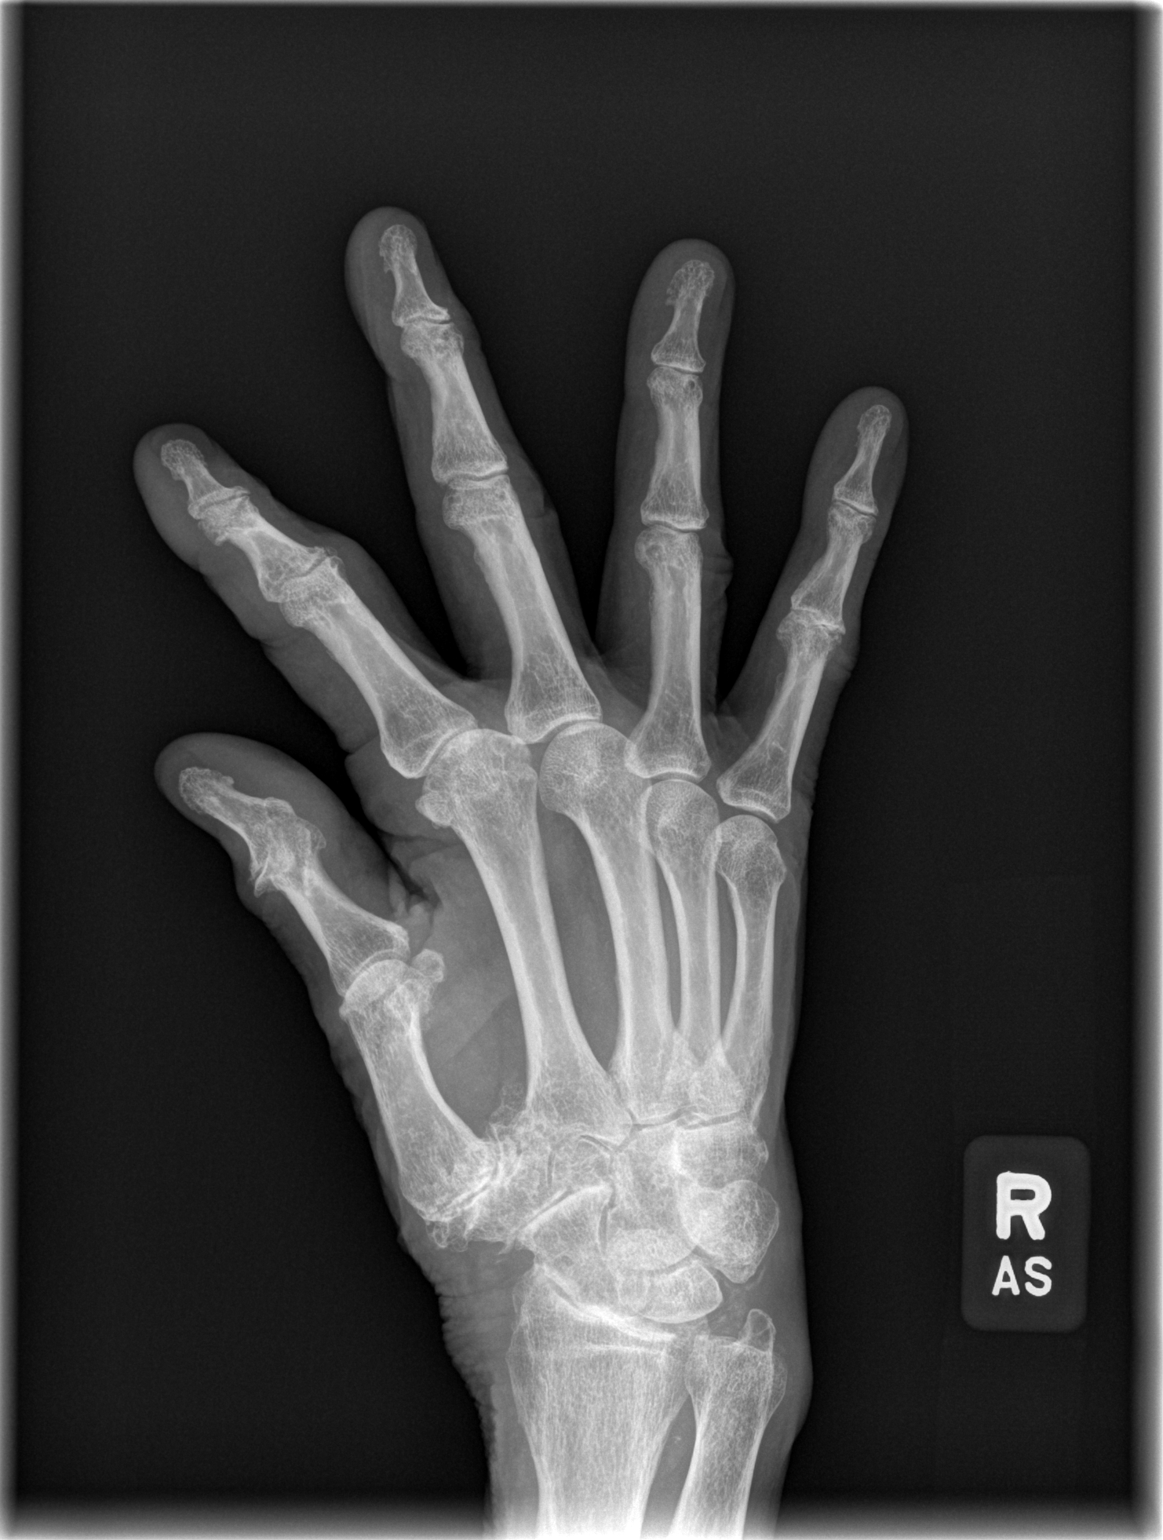

[x hand lat right]
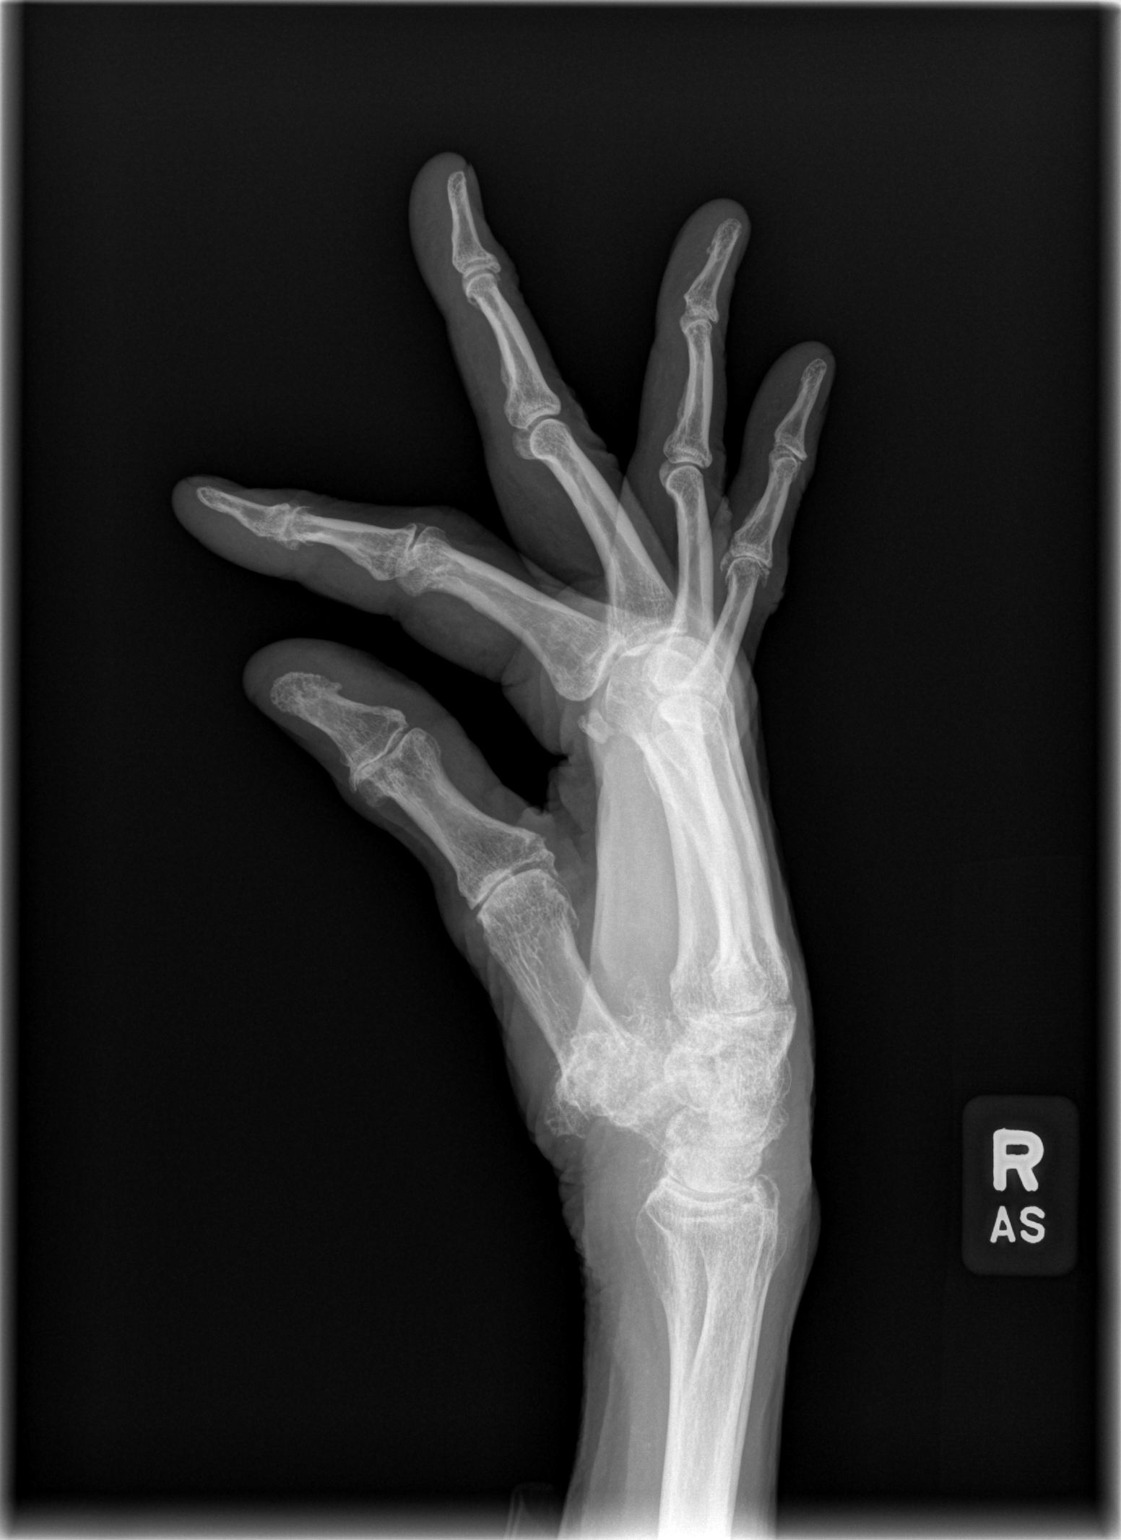

[3 of 3 positions shown; findings below may reference images not displayed]

FINDINGS: Frontal, oblique, and lateral views of the right hand are obtained.
Bones are normally mineralized. No acute fracture, subluxation, or
dislocation. Prominent joint space narrowing and osteophyte
formation are seen throughout the radiocarpal joint and radial
aspect of the carpus. Similar changes are seen within the first
through third metacarpophalangeal joints and diffusely throughout
the interphalangeal joints. Findings are most consistent with
osteoarthritis. There are no erosive changes. Soft tissues are
grossly unremarkable.
IMPRESSION: 1. Extensive multifocal osteoarthritis. No acute or destructive bony
lesions.

## 2023-02-17 MED ORDER — OMADACYCLINE TOSYLATE 150 MG PO TABS
300.0000 mg | ORAL_TABLET | Freq: Every day | ORAL | Status: DC
Start: 2023-02-17 — End: 2023-03-25

## 2023-02-17 NOTE — Telephone Encounter (Addendum)
Reviewed ID note from today. Yes this is ok for pt to take.

## 2023-02-17 NOTE — Progress Notes (Signed)
HPI: [name] is a 82 y.o. male who presents to the [facility] pharmacy clinic for M. chelonae follow-up.   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   Bilateral lower extremity edema 05/24/2018   Persistent atrial fibrillation (HCC) 03/16/2018   Thoracic aortic aneurysm (HCC)    (HFpEF) heart failure with preserved ejection fraction (HCC)    Coronary artery disease involving native coronary artery of native heart without angina pectoris 06/26/2013   Ventricular tachycardia (HCC)    Chronic venous insufficiency    Hyperlipidemia LDL goal <70 06/08/2011   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  Previous Medications   ACETAMINOPHEN (TYLENOL) 325 MG TABLET    Take 2 tablets (650 mg total) by mouth every 6 (six) hours as needed for moderate pain.   AMIODARONE (PACERONE) 200 MG TABLET    TAKE 1 TABLET(200 MG) BY MOUTH DAILY   APIXABAN (ELIQUIS) 5 MG TABS TABLET    Take 1 tablet (5 mg total) by mouth 2 (two) times daily.   ASCORBIC ACID (VITAMIN C) 500 MG TABLET    Take 500 mg by mouth daily.   ATORVASTATIN (LIPITOR) 20 MG TABLET    Take 20 mg by mouth daily.   ATORVASTATIN (LIPITOR) 20 MG TABLET    TAKE 1 TABLET(20 MG) BY MOUTH DAILY   BOSWELLIA SERRATA (BOSWELLIA PO)    Take 1,200 mg by mouth daily.   CARVEDILOL (COREG) 3.125 MG TABLET    Take by mouth.   CARVEDILOL (COREG) 3.125 MG TABLET    Take by mouth.   CEPHALEXIN (KEFLEX) 500 MG CAPSULE    Take 1 capsule (500 mg total) by mouth 3 (three) times daily.   CLOFAZIMINE 50 MG CAPS CAPSULE (FOR COMPASSIONATE USE)    Take 2  capsules (100 mg total) by mouth daily with breakfast.   DOXYCYCLINE (VIBRAMYCIN) 100 MG CAPSULE    Take 1 capsule (100 mg total) by mouth 2 (two) times daily.   ELIQUIS 5 MG TABS TABLET    Take 5 mg by mouth 2 (two) times daily.   FUROSEMIDE (LASIX) 20 MG TABLET    Take 1 tablet (20 mg total) by mouth every other day.   LIDOCAINE (LIDODERM) 5 %    Place 1 patch onto the skin daily.   MISC NATURAL PRODUCTS (GLUCOSAMINE CHONDROITIN TRIPLE) TABS    Take 1 tablet by mouth daily.   MULTIPLE VITAMIN (MULTIVITAMIN WITH MINERALS) TABS TABLET    Take 1 tablet by mouth daily.   OMEGA-3 FATTY ACIDS (FISH OIL) 1200 MG CAPS    Take 1,200 mg by mouth daily.   PRESCRIPTION MEDICATION    Take 2 tablets by mouth daily.  Modified Medications   No medications on file  Discontinued Medications   AZITHROMYCIN (ZITHROMAX) 500 MG TABLET    Take 1 tablet (500 mg total) by mouth daily.    Allergies: No Known Allergies  Past Medical History: Past Medical History:  Diagnosis Date   Ascending aorta dilatation (HCC)    44mm by chest CT angio 04/2018   CAD (coronary artery disease)  a. 05/2006 Abnl stress test w/ 2-57mm ST dep; b. 05/2006 Cath/PCI: LM nl, LAD 30-40ost, 20-30p, 90d (small), D1 nl, D2 nl, LCX nl, OM1/2 nl, RCA Ca2+, 47m/d (3.5x16 Liberty BMS & 3.5x12 Liberty BMS), EF 50%.   Chronic diastolic CHF (congestive heart failure) (HCC)    a. 10/2017 Echo: EF 50-55%, mild AI, sev dil LA, mod dil RA.   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 CHA2DS2VASc 5-->Eliquis; b. Prev WCT on flecainide;  c. 12/2017 s/p DCCV.   Pneumothorax    Prolonged Q-T interval on ECG 06/22/2022   Ventricular tachycardia (HCC)    a. possibly proarrhythmia from flecainide.    Social History: Social History   Socioeconomic History   Marital status: Single    Spouse name: Not on file   Number of children: Not on file    Years of education: Not on file   Highest education level: Not on file  Occupational History   Occupation: retired  Tobacco Use   Smoking status: Never   Smokeless tobacco: Never  Vaping Use   Vaping status: Never Used  Substance and Sexual Activity   Alcohol use: Not Currently    Comment: occsaoinal   Drug use: Not Currently    Types: Other-see comments   Sexual activity: Not Currently  Other Topics Concern   Not on file  Social History Narrative   ** Merged History Encounter **       Lives in [place] by himself but 2 dtrs nearby and help out when necessary.   Social Determinants of Health   Financial Resource Strain: Not on file  Food Insecurity: Not on file  Transportation Needs: Not on file  Physical Activity: Not on file  Stress: Not on file  Social Connections: Unknown (11/28/2021)   Received from [facility]   Social Network    Social Network: Not on file   Lipids: Lab Results  Component Value Date   CHOL 115 10/17/2021   TRIG 47 10/17/2021   HDL 47 10/17/2021   CHOLHDL 2.4 10/17/2021   VLDL 7 06/19/2016   LDLCALC 57 10/17/2021    Current Regimen: Azithromycin (stopped Sunday) + clofazimine  Assessment: [name] presents to clinic today with his daughter Stark Klein for M. chelonae medication follow-up. He has been taking azithromycin and clofazimine since June 2023 and has been tolerating them well overall. These were started in a sequential fashion given risk for QTc prolongation in combination with amiodarone; his interval at that time remained normal. His QTc interval was rechecked on 01/25/2023 and was increased at 493. Provider recommended stopping azithromycin and transitioning to omadacycline to limit further prolongation. Patient stopped azithromycin on Sunday and has continued taking clofazimine. Additional clofazimine refills for 3 months were provided to patient today.  Patient's insurance approved omadacycline; however, his monthly copay would be ~$2000.  Patient was provided with a 30-day supply of omadacycline samples today. Counseled to take TWO omadacycline tablets (300 mg) by mouth once daily. Counseled that medication needs to be taken on an empty stomach and with a full glass of water. Also counseled that it must be taken after at least 4 hours of fasting and to wait at least 2 more hours after taking to eat any food. Counseled to avoid milk or antacids for 4 hours after taking as well. He agreed to take omadacycline before bed to help minimize side effects.  Advised  that common side effects include nausea and vomiting and to let us know if he experience any adverse effects. He deferred taking preventative anti-emetics. Reviewed I would prefer not to start serotonergic agents such as Zofran or Phenergan given their impact on QTc prolongation. Would prefer scopolamine though I know this would not be ideal in the long term given his advanced age. Explained that we would be drawing labs periodically to check on kidney and liver function. Advised to stay out of the sun for extended periods of time and to wear sunscreen and cover his face and skin as omadacycline can cause photosensitivity. Encouraged not to miss any doses and to continue taking until discontinued. Counseled on what to do if dose is missed - if it is closer to the missed dose take immediately; if closer to next dose skip dose and take the next dose at the usual time. Patient will call me if any issues arise. He will follow up with Dr. Daiva Eves in ~one month to evaluate appropriate length of therapy. We have an additional 15 days of samples here in the clinic should he need to continue treatment. We could consider reaching out to the manufacturer for further samples if needed.   Plan: - Continue clofazimine 100 mg (2 tablets) once daily - Start omadacycline 300 mg (2 tablets) once daily - Follow up with Dr. Daiva Eves on 8/28   Margarite Gouge, PharmD, CPP, BCIDP, AAHIVP Clinical Pharmacist  Practitioner Infectious Diseases Clinical Pharmacist [facility] 02/17/2023, 12:00 PM

## 2023-02-17 NOTE — Telephone Encounter (Signed)
Left message to call office

## 2023-02-17 NOTE — Telephone Encounter (Signed)
Pt c/o medication issue:  1. Name of Medication: Omadacycline Riki Altes)   2. How are you currently taking this medication (dosage and times per day)?   3. Are you having a reaction (difficulty breathing--STAT)?   4. What is your medication issue? Patient would like to know if this medication could be added with his currently medications. Requesting call back.

## 2023-02-17 NOTE — Patient Instructions (Addendum)
Here is what we reviewed today:  - Take TWO omadacycline tablets (300 mg) by mouth once daily - Take  on an empty stomach and with a full glass of water - Take after at least 4 hours of fasting and to wait at least 2 more hours after taking to eat any food - Avoid milk or antacids for 4 hours after taking as well  - Common side effects include nausea and vomiting and to let us know if you experience any adverse effects - We will be drawing labs periodically to check on kidney and liver function - Please stay out of the sun for extended periods of time and to wear sunscreen and cover your face and skin as omadacycline can cause photosensitivity  Let me know if you have any questions - [phone].  Great to see you today!  [name]

## 2023-02-18 ENCOUNTER — Other Ambulatory Visit (HOSPITAL_COMMUNITY): Payer: Self-pay

## 2023-02-18 ENCOUNTER — Telehealth: Payer: Self-pay

## 2023-02-18 NOTE — Telephone Encounter (Signed)
[name] called, says he got something in the mail from his insurance company and wanted more information from our office. Sounds like he received approval letter for omadacycline in the mail. He is requesting this be sent to [facility] in [place].   Per [name], [facility] he received a 30-day supply of samples in the office yesterday. Called [name] back to discuss, no answer. Left voicemail stating he will not need to pick anything up at the pharmacy as he received his medication in the clinic yesterday.   Jesse Ano, RN

## 2023-02-19 NOTE — Telephone Encounter (Addendum)
Called patient and advised okay for him to take Omadacycline.  He was grateful for call back and is going to begin taking.  No other questions or concerns.

## 2023-02-24 DIAGNOSIS — H524 Presbyopia: Secondary | ICD-10-CM | POA: Diagnosis not present

## 2023-02-24 DIAGNOSIS — H52223 Regular astigmatism, bilateral: Secondary | ICD-10-CM | POA: Diagnosis not present

## 2023-02-24 DIAGNOSIS — H5203 Hypermetropia, bilateral: Secondary | ICD-10-CM | POA: Diagnosis not present

## 2023-02-24 DIAGNOSIS — H04123 Dry eye syndrome of bilateral lacrimal glands: Secondary | ICD-10-CM | POA: Diagnosis not present

## 2023-02-24 DIAGNOSIS — H25813 Combined forms of age-related cataract, bilateral: Secondary | ICD-10-CM | POA: Diagnosis not present

## 2023-02-25 ENCOUNTER — Telehealth: Payer: Self-pay | Admitting: Pharmacist

## 2023-02-25 NOTE — Telephone Encounter (Signed)
Patient picked up 2 bottles of clofazimine on 02/17/23. Supply should last for ~3 months. Next refill due around end of October/beginning of November.  Khloe Hunkele L. Zi Newbury, PharmD, BCIDP, AAHIVP, CPP Clinical Pharmacist Practitioner Infectious Diseases Clinical Pharmacist [facility] 02/25/2023, 3:44 PM

## 2023-02-28 ENCOUNTER — Emergency Department (HOSPITAL_COMMUNITY): Payer: Medicare HMO

## 2023-02-28 ENCOUNTER — Encounter (HOSPITAL_COMMUNITY): Payer: Self-pay | Admitting: *Deleted

## 2023-02-28 ENCOUNTER — Other Ambulatory Visit: Payer: Self-pay

## 2023-02-28 ENCOUNTER — Emergency Department (HOSPITAL_COMMUNITY)
Admission: EM | Admit: 2023-02-28 | Discharge: 2023-02-28 | Disposition: A | Discharge status: Home / Self Care | Payer: Medicare HMO | Source: Home / Self Care | Attending: Emergency Medicine | Admitting: Emergency Medicine

## 2023-02-28 ENCOUNTER — Observation Stay (HOSPITAL_COMMUNITY)
Admission: EM | Admit: 2023-02-28 | Discharge: 2023-03-03 | Disposition: A | Discharge status: Home / Self Care | Payer: Medicare HMO | Attending: Internal Medicine | Admitting: Internal Medicine

## 2023-02-28 DIAGNOSIS — D62 Acute posthemorrhagic anemia: Secondary | ICD-10-CM | POA: Insufficient documentation

## 2023-02-28 DIAGNOSIS — I4819 Other persistent atrial fibrillation: Secondary | ICD-10-CM | POA: Diagnosis not present

## 2023-02-28 DIAGNOSIS — Z96652 Presence of left artificial knee joint: Secondary | ICD-10-CM | POA: Diagnosis not present

## 2023-02-28 DIAGNOSIS — W228XXA Striking against or struck by other objects, initial encounter: Secondary | ICD-10-CM | POA: Insufficient documentation

## 2023-02-28 DIAGNOSIS — J9601 Acute respiratory failure with hypoxia: Secondary | ICD-10-CM | POA: Diagnosis not present

## 2023-02-28 DIAGNOSIS — M79671 Pain in right foot: Secondary | ICD-10-CM | POA: Diagnosis not present

## 2023-02-28 DIAGNOSIS — S91311A Laceration without foreign body, right foot, initial encounter: Secondary | ICD-10-CM | POA: Insufficient documentation

## 2023-02-28 DIAGNOSIS — I2510 Atherosclerotic heart disease of native coronary artery without angina pectoris: Secondary | ICD-10-CM | POA: Diagnosis not present

## 2023-02-28 DIAGNOSIS — Z7901 Long term (current) use of anticoagulants: Secondary | ICD-10-CM | POA: Insufficient documentation

## 2023-02-28 DIAGNOSIS — I10 Essential (primary) hypertension: Secondary | ICD-10-CM | POA: Diagnosis present

## 2023-02-28 DIAGNOSIS — Z955 Presence of coronary angioplasty implant and graft: Secondary | ICD-10-CM | POA: Insufficient documentation

## 2023-02-28 DIAGNOSIS — I872 Venous insufficiency (chronic) (peripheral): Secondary | ICD-10-CM | POA: Diagnosis present

## 2023-02-28 DIAGNOSIS — I495 Sick sinus syndrome: Secondary | ICD-10-CM | POA: Diagnosis present

## 2023-02-28 DIAGNOSIS — R29818 Other symptoms and signs involving the nervous system: Secondary | ICD-10-CM | POA: Diagnosis not present

## 2023-02-28 DIAGNOSIS — R55 Syncope and collapse: Secondary | ICD-10-CM | POA: Diagnosis not present

## 2023-02-28 DIAGNOSIS — S91319A Laceration without foreign body, unspecified foot, initial encounter: Secondary | ICD-10-CM | POA: Insufficient documentation

## 2023-02-28 DIAGNOSIS — Z23 Encounter for immunization: Secondary | ICD-10-CM | POA: Insufficient documentation

## 2023-02-28 DIAGNOSIS — Z79899 Other long term (current) drug therapy: Secondary | ICD-10-CM | POA: Insufficient documentation

## 2023-02-28 DIAGNOSIS — I700 Atherosclerosis of aorta: Secondary | ICD-10-CM | POA: Diagnosis not present

## 2023-02-28 DIAGNOSIS — D649 Anemia, unspecified: Principal | ICD-10-CM

## 2023-02-28 DIAGNOSIS — I5032 Chronic diastolic (congestive) heart failure: Secondary | ICD-10-CM | POA: Diagnosis not present

## 2023-02-28 DIAGNOSIS — R569 Unspecified convulsions: Secondary | ICD-10-CM | POA: Diagnosis not present

## 2023-02-28 DIAGNOSIS — I110 Hypertensive heart disease with heart failure: Secondary | ICD-10-CM | POA: Insufficient documentation

## 2023-02-28 DIAGNOSIS — S99921A Unspecified injury of right foot, initial encounter: Secondary | ICD-10-CM | POA: Diagnosis not present

## 2023-02-28 DIAGNOSIS — I5030 Unspecified diastolic (congestive) heart failure: Secondary | ICD-10-CM | POA: Diagnosis present

## 2023-02-28 DIAGNOSIS — W010XXA Fall on same level from slipping, tripping and stumbling without subsequent striking against object, initial encounter: Secondary | ICD-10-CM | POA: Diagnosis not present

## 2023-02-28 DIAGNOSIS — I712 Thoracic aortic aneurysm, without rupture, unspecified: Secondary | ICD-10-CM | POA: Diagnosis present

## 2023-02-28 DIAGNOSIS — Z96641 Presence of right artificial hip joint: Secondary | ICD-10-CM | POA: Insufficient documentation

## 2023-02-28 DIAGNOSIS — R0902 Hypoxemia: Secondary | ICD-10-CM

## 2023-02-28 DIAGNOSIS — I7121 Aneurysm of the ascending aorta, without rupture: Secondary | ICD-10-CM | POA: Diagnosis not present

## 2023-02-28 LAB — BASIC METABOLIC PANEL
Anion gap: 10 (ref 5–15)
BUN: 14 mg/dL (ref 8–23)
CO2: 22 mmol/L (ref 22–32)
Calcium: 7.9 mg/dL — ABNORMAL LOW (ref 8.9–10.3)
Chloride: 106 mmol/L (ref 98–111)
Creatinine, Ser: 1.12 mg/dL (ref 0.61–1.24)
GFR, Estimated: >60 mL/min (ref >60)
Glucose, Bld: 123 mg/dL — ABNORMAL HIGH (ref 70–99)
Potassium: 3.9 mmol/L (ref 3.5–5.1)
Sodium: 138 mmol/L (ref 135–145)

## 2023-02-28 LAB — BPAM RBC
Blood Product Expiration Date: 202409062359
Unit Type and Rh: 5100

## 2023-02-28 LAB — PREPARE RBC (CROSSMATCH)

## 2023-02-28 LAB — CBC
HCT: 27.4 % — ABNORMAL LOW (ref 39.0–52.0)
Hemoglobin: 8.1 g/dL — ABNORMAL LOW (ref 13.0–17.0)
MCH: 26 pg (ref 26.0–34.0)
MCHC: 29.6 g/dL — ABNORMAL LOW (ref 30.0–36.0)
MCV: 87.8 fL (ref 80.0–100.0)
Platelets: 216 10*3/uL (ref 150–400)
RBC: 3.12 MIL/uL — ABNORMAL LOW (ref 4.22–5.81)
RDW: 15.6 % — ABNORMAL HIGH (ref 11.5–15.5)
WBC: 9.3 10*3/uL (ref 4.0–10.5)
nRBC: 0 % (ref 0.0–0.2)

## 2023-02-28 LAB — I-STAT CHEM 8, ED
BUN: 11 mg/dL (ref 8–23)
Calcium, Ion: 1.17 mmol/L (ref 1.15–1.40)
Chloride: 105 mmol/L (ref 98–111)
Creatinine, Ser: 1 mg/dL (ref 0.61–1.24)
Glucose, Bld: 99 mg/dL (ref 70–99)
HCT: 27 % — ABNORMAL LOW (ref 39.0–52.0)
Hemoglobin: 9.2 g/dL — ABNORMAL LOW (ref 13.0–17.0)
Potassium: 3.8 mmol/L (ref 3.5–5.1)
Sodium: 141 mmol/L (ref 135–145)
TCO2: 25 mmol/L (ref 22–32)

## 2023-02-28 LAB — CBG MONITORING, ED: Glucose-Capillary: 98 mg/dL (ref 70–99)

## 2023-02-28 LAB — TYPE AND SCREEN
ABO/RH(D): O POS
Antibody Screen: NEGATIVE
Unit division: 0

## 2023-02-28 LAB — BRAIN NATRIURETIC PEPTIDE: B Natriuretic Peptide: 445.7 pg/mL — ABNORMAL HIGH (ref 0.0–100.0)

## 2023-02-28 LAB — TROPONIN I (HIGH SENSITIVITY): Troponin I (High Sensitivity): 13 ng/L (ref <18)

## 2023-02-28 MED ORDER — SODIUM CHLORIDE 0.9% IV SOLUTION: Freq: Once | INTRAVENOUS | Status: DC

## 2023-02-28 MED ORDER — IOHEXOL 350 MG/ML SOLN
75.0000 mL | Freq: Once | INTRAVENOUS | Status: AC | PRN
  Administered 2023-02-28: 75 mL via INTRAVENOUS

## 2023-02-28 MED ORDER — TETANUS-DIPHTH-ACELL PERTUSSIS 5-2.5-18.5 LF-MCG/0.5 IM SUSY
0.5000 mL | PREFILLED_SYRINGE | Freq: Once | INTRAMUSCULAR | Status: AC
  Administered 2023-02-28: 0.5 mL via INTRAMUSCULAR
  Filled 2023-02-28: qty 0.5

## 2023-02-28 NOTE — H&P (Signed)
History and Physical    Patient: [name] ZOX:[id number] DOB: 1940/11/22 DOA: 02/28/2023 DOS: the patient was seen and examined on 02/28/2023 PCP: Renford Dills, MD  Patient coming from: Home  Chief Complaint:  Chief Complaint  Patient presents with   Seizures    HPI: [name] is a 82 y.o. 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  Patient was seen in the ED earlier in the day for cut on his foot with significant blood loss.  He was treated and discharged but on his arrival home felt lightheaded.  While sitting at his home computer he slumped forward without loss of consciousness and started having shaking of the that arm that lasted about 20 seconds.  It resolved and then patient got up to walk to the bathroom and felt weak, having to hold onto the walls for support, had an episode of fecal incontinence and his arm again started shaking.  The episode was followed by up to 15 minutes of confusion.Marland Kitchen  He was awake and alert by arrival in the emergency room.  He was hypoxic with EMS with O2 sat to 70%. ED course and data review:Vitals within normal limits.  Labs notable for hemoglobin of 8.1 which is down from 9.2 at his earlier visit to the ED, BMP unremarkable. EKG, personally viewed and interpreted showing A-fib at 68 with prolonged QT and no ischemic ST-T wave changes. Chest x-ray stable and nonacute Head CT with no acute intracranial pathology Patient started on a unit PRBC The ED provider spoke with neurologist, Dr. Otelia Limes who advised admission for neurology/seizure workup. Patient continued to  desaturate with ambulation in the ED whereupon a CTA PE chest was ordered, currently pending Hospitalist consulted for admission.    Past Medical History:  Diagnosis Date   Ascending aorta dilatation (HCC)    44mm by chest CT angio 04/2018   CAD (coronary artery disease)    a. 05/2006 Abnl stress test w/ 2-85mm ST dep; b. 05/2006 Cath/PCI: LM nl, LAD 30-40ost, 20-30p, 90d (small), D1 nl, D2 nl, LCX nl, OM1/2 nl, RCA Ca2+, 58m/d (3.5x16 Liberty BMS & 3.5x12 Liberty BMS), EF 50%.   Chronic diastolic CHF (congestive heart failure) (HCC)    a. 10/2017 Echo: EF 50-55%, mild AI, sev dil LA, mod dil RA.   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 CHA2DS2VASc 5-->Eliquis; b. Prev WCT on flecainide;  c. 12/2017 s/p DCCV.   Pneumothorax    Prolonged Q-T interval on ECG 06/22/2022   Ventricular tachycardia (HCC)    a. possibly proarrhythmia from flecainide.   Past Surgical History:  Procedure Laterality Date   CARDIOVERSION N/A 12/23/2017   Procedure: CARDIOVERSION;  Surgeon: Quintella Reichert, MD;  Location: [facility] ENDOSCOPY;  Service: Cardiovascular;  Laterality: N/A;   CARDIOVERSION N/A 03/08/2018   Procedure: CARDIOVERSION;  Surgeon: Quintella Reichert, MD;  Location: [facility] ENDOSCOPY;  Service: Cardiovascular;  Laterality: N/A;   INGUINAL HERNIA REPAIR     x2   left total hip surgery     TEE WITHOUT CARDIOVERSION N/A 03/08/2018   Procedure: TRANSESOPHAGEAL ECHOCARDIOGRAM (TEE);  Surgeon: Quintella Reichert, MD;  Location: [facility] ENDOSCOPY;  Service: Cardiovascular;  Laterality: N/A;   TOTAL HIP ARTHROPLASTY Right 08/19/2020   Procedure: TOTAL HIP ARTHROPLASTY ANTERIOR APPROACH;  Surgeon: Ollen Gross, MD;  Location: [facility] ORS;  Service: Orthopedics;  Laterality: Right;    TOTAL KNEE ARTHROPLASTY Left 02/28/2018   Procedure: LEFT TOTAL KNEE ARTHROPLASTY;  Surgeon: Ollen Gross, MD;  Location: [facility] ORS;   Service: Orthopedics;  Laterality: Left;   VEIN LIGATION AND STRIPPING Left 04/08/2020   Procedure: LEFT LOWER EXTREMITY VEIN LIGATION AND EXCISION OF ULCER;  Surgeon: Chuck Hint, MD;  Location: [facility] OR;  Service: Vascular;  Laterality: Left;   Social History:  reports that he has never smoked. He has never used smokeless tobacco. He reports that he does not currently use alcohol. He reports that he does not currently use drugs after having used the following drugs: Other-see comments.  No Known Allergies  Family History  Problem Relation Age of Onset   Coronary artery disease Other     Prior to Admission medications   Medication Sig Start Date End Date Taking? Authorizing Provider  acetaminophen (TYLENOL) 325 MG tablet Take 2 tablets (650 mg total) by mouth every 6 (six) hours as needed for moderate pain. 03/17/22   Wallis Bamberg, PA-C  amiodarone (PACERONE) 200 MG tablet TAKE 1 TABLET(200 MG) BY MOUTH DAILY 09/21/22   Quintella Reichert, MD  apixaban (ELIQUIS) 5 MG TABS tablet Take 1 tablet (5 mg total) by mouth 2 (two) times daily. 03/16/22   Quintella Reichert, MD  ascorbic acid (VITAMIN C) 500 MG tablet Take 500 mg by mouth daily.    [provider]  atorvastatin (LIPITOR) 20 MG tablet Take 20 mg by mouth daily. 03/21/22   [provider]  atorvastatin (LIPITOR) 20 MG tablet TAKE 1 TABLET(20 MG) BY MOUTH DAILY 12/16/22   Quintella Reichert, MD  Boswellia Serrata (BOSWELLIA PO) Take 1,200 mg by mouth daily.    [provider]  carvedilol (COREG) 3.125 MG tablet Take by mouth. 01/21/22   [provider]  carvedilol (COREG) 3.125 MG tablet Take by mouth. 01/21/22   [provider]  cephALEXin (KEFLEX) 500 MG capsule Take 1 capsule (500 mg total) by mouth 3 (three) times daily. 09/29/22   Wallis Bamberg, PA-C  clofazimine 50 mg CAPS capsule (for compassionate use) Take 2 capsules (100 mg total) by mouth daily with breakfast. 11/02/22   Kuppelweiser, Cassie L,  RPH-CPP  doxycycline (VIBRAMYCIN) 100 MG capsule Take 1 capsule (100 mg total) by mouth 2 (two) times daily. 03/17/22   Wallis Bamberg, PA-C  ELIQUIS 5 MG TABS tablet Take 5 mg by mouth 2 (two) times daily. 03/17/22   [provider]  furosemide (LASIX) 20 MG tablet Take 1 tablet (20 mg total) by mouth every other day. 12/11/22 03/11/23  Meriam Sprague, MD  lidocaine (LIDODERM) 5 % Place 1 patch onto the skin daily.    [provider]  Misc Natural Products (GLUCOSAMINE CHONDROITIN TRIPLE) TABS Take 1 tablet by mouth daily.    [provider]  Multiple Vitamin (MULTIVITAMIN WITH MINERALS) TABS tablet Take 1 tablet by mouth daily.    [provider]  Omadacycline Tosylate 150 MG TABS Take 2 tablets (300 mg total) by mouth daily. 02/17/23   Jennette Kettle, RPH-CPP  Omega-3 Fatty Acids (FISH OIL) 1200 MG CAPS Take 1,200 mg by mouth daily.    [provider]  PRESCRIPTION MEDICATION Take  2 tablets by mouth daily.    [provider]    Physical Exam: Vitals:   02/28/23 1830 02/28/23 1900 02/28/23 1915 02/28/23 2145  BP: 132/78     Pulse:  (!) 28 92 (!) 38  Resp: 17 11 15 13   Temp:    97.7 F (36.5 C)  TempSrc:    Oral  SpO2:  93% 100% (!) 72%   Physical Exam Vitals and nursing note reviewed.  Constitutional:      General: He is awake. He is not in acute distress.    Comments: disoriented  HENT:     Head: Normocephalic and atraumatic.  Cardiovascular:     Rate and Rhythm: Normal rate and regular rhythm.     Heart sounds: Normal heart sounds.  Pulmonary:     Effort: Pulmonary effort is normal.     Breath sounds: Normal breath sounds.  Abdominal:     Palpations: Abdomen is soft.     Tenderness: There is no abdominal tenderness.  Neurological:     General: No focal deficit present.     Mental Status: He is alert. He is disoriented.     Labs on Admission: I have personally reviewed following labs and imaging  studies  CBC: Recent Labs  Lab 02/28/23 1340 02/28/23 1816  WBC  --  9.3  HGB 9.2* 8.1*  HCT 27.0* 27.4*  MCV  --  87.8  PLT  --  216   Basic Metabolic Panel: Recent Labs  Lab 02/28/23 1340 02/28/23 1816  NA 141 138  K 3.8 3.9  CL 105 106  CO2  --  22  GLUCOSE 99 123*  BUN 11 14  CREATININE 1.00 1.12  CALCIUM  --  7.9*   GFR: Estimated Creatinine Clearance: 49.4 mL/min (by C-G formula based on SCr of 1.12 mg/dL). Liver Function Tests: No results for input(s): "AST", "ALT", "ALKPHOS", "BILITOT", "PROT", "ALBUMIN" in the last 168 hours. No results for input(s): "LIPASE", "AMYLASE" in the last 168 hours. No results for input(s): "AMMONIA" in the last 168 hours. Coagulation Profile: No results for input(s): "INR", "PROTIME" in the last 168 hours. Cardiac Enzymes: No results for input(s): "CKTOTAL", "CKMB", "CKMBINDEX", "TROPONINI" in the last 168 hours. BNP (last 3 results) No results for input(s): "PROBNP" in the last 8760 hours. HbA1C: No results for input(s): "HGBA1C" in the last 72 hours. CBG: Recent Labs  Lab 02/28/23 1751  GLUCAP 98   Lipid Profile: No results for input(s): "CHOL", "HDL", "LDLCALC", "TRIG", "CHOLHDL", "LDLDIRECT" in the last 72 hours. Thyroid Function Tests: No results for input(s): "TSH", "T4TOTAL", "FREET4", "T3FREE", "THYROIDAB" in the last 72 hours. Anemia Panel: No results for input(s): "VITAMINB12", "FOLATE", "FERRITIN", "TIBC", "IRON", "RETICCTPCT" in the last 72 hours. Urine analysis:    Component Value Date/Time   COLORURINE YELLOW 02/21/2018 0947   APPEARANCEUR CLEAR 02/21/2018 0947   LABSPEC 1.010 02/21/2018 0947   PHURINE 5.0 02/21/2018 0947   GLUCOSEU NEGATIVE 02/21/2018 0947   HGBUR NEGATIVE 02/21/2018 0947   BILIRUBINUR NEGATIVE 02/21/2018 0947   KETONESUR NEGATIVE 02/21/2018 0947   PROTEINUR NEGATIVE 02/21/2018 0947   UROBILINOGEN 0.2 07/09/2009 0918   NITRITE NEGATIVE 02/21/2018 0947   LEUKOCYTESUR TRACE (A)  02/21/2018 0947    Radiological Exams on Admission: CT Head Wo Contrast  Result Date: 02/28/2023 CLINICAL DATA:  New onset seizure EXAM: CT HEAD WITHOUT CONTRAST TECHNIQUE: Contiguous axial images were obtained from the base of the skull through the vertex without intravenous contrast. RADIATION DOSE REDUCTION: This  exam was performed according to the departmental dose-optimization program which includes automated exposure control, adjustment of the mA and/or kV according to patient size and/or use of iterative reconstruction technique. COMPARISON:  None Available. FINDINGS: Brain: There is no acute intracranial hemorrhage, extra-axial fluid collection, or acute infarct. Parenchymal volume is normal. The ventricles are normal in size. Gray-white differentiation is preserved. The pituitary and suprasellar region are normal. There is no mass lesion. There is no mass effect or midline shift. Vascular: There is calcification of the bilateral carotid siphons. Skull: Normal. Negative for fracture or focal lesion. Sinuses/Orbits: The paranasal sinuses are clear. The globes and orbits are unremarkable. Other: The mastoid air cells and middle ear cavities are clear. IMPRESSION: No acute intracranial pathology. Electronically Signed   By: Lesia Hausen M.D.   On: 02/28/2023 19:19   DG Chest 2 View  Result Date: 02/28/2023 CLINICAL DATA:  Seizure-like activity EXAM: CHEST - 2 VIEW COMPARISON:  Chest radiograph 01/25/2023 FINDINGS: The patient is rotated to the left. The cardiomediastinal silhouette is stable. There is no focal consolidation or pulmonary edema. There is no pleural effusion or pneumothorax There is no acute osseous abnormality. Thoracic dextrocurvature is unchanged. IMPRESSION: Stable chest with no radiographic evidence of acute cardiopulmonary process. Electronically Signed   By: Lesia Hausen M.D.   On: 02/28/2023 19:13     Data Reviewed: Relevant notes from primary care and specialist visits, past  discharge summaries as available in EHR, including Care Everywhere. Prior diagnostic testing as pertinent to current admission diagnoses Updated medications and problem lists for reconciliation ED course, including vitals, labs, imaging, treatment and response to treatment Triage notes, nursing and pharmacy notes and ED provider's notes Notable results as noted in HPI   Assessment and Plan: Witnessed seizure-like activity (HCC) Possible acute metabolic encephalopathy/acute confusion Patient with seizure-like activity and confusion in the setting of blood loss anemia from a laceration sustained earlier in the day Patient noted to be confused on exam in the ED Neuro was consulted from the ED, Dr. Otelia Limes Will get EEG, continuous cardiac monitoring and echocardiogram Seizure precautions Ativan as needed seizure Neurology consult to follow Delirium precautions  ABLA (acute blood loss anemia), symptomatic Laceration right foot 02/28/2023 Patient had a laceration medial aspect right foot with bleeding, hemostasis was achieved Hemoglobin 8.1, down from 9.2 a few hours prior Continue to monitor Ongoing bleeding not suspected so will continue Eliquis  Acute respiratory failure with hypoxia (HCC) O2 sat was 70 with EMS, the patient appeared comfortable Will continue oxygen to maintain sats above 94 CTA chest was negative for PE or other acute abnormality  Thoracic aortic aneurysm (HCC) CTA showing stable aneurysm at 4.6 cm Last seen by cardiothoracic surgery on 08/2022 to follow-up in 1 year  Persistent atrial fibrillation (HCC) Rate controlled at 68 Continue carvedilol and amiodarone as well as Eliquis  (HFpEF) heart failure with preserved ejection fraction (HCC) Clinically euvolemic Continue carvedilol and furosemide  Coronary artery disease involving native coronary artery of native heart without angina pectoris No complaints of chest pain, EKG with no ischemic changes Continue  apixaban, atorvastatin and carvedilol  Chronic venous insufficiency No swelling noted in lower extremities  Essential hypertension BP stable Continue carvedilol    DVT prophylaxis: eliquis  Consults: Dr Otelia Limes  Advance Care Planning:   Code Status: Prior   Family Communication: none  Disposition Plan: Back to previous home environment  Severity of Illness: The appropriate patient status for this patient is INPATIENT. Inpatient status is  judged to be reasonable and necessary in order to provide the required intensity of service to ensure the patient's safety. The patient's presenting symptoms, physical exam findings, and initial radiographic and laboratory data in the context of their chronic comorbidities is felt to place them at high risk for further clinical deterioration. Furthermore, it is not anticipated that the patient will be medically stable for discharge from the hospital within 2 midnights of admission.   * I certify that at the point of admission it is my clinical judgment that the patient will require inpatient hospital care spanning beyond 2 midnights from the point of admission due to high intensity of service, high risk for further deterioration and high frequency of surveillance required.*  Author: Andris Baumann, MD 02/28/2023 10:01 PM  For on call review [email or web address].

## 2023-02-28 NOTE — ED Notes (Signed)
Pt is a&ox4, pwd. Pt has a very small lac to the inside of his right foot at arch. Pt denies any pain but does feel dizzy. No other injuries noted or reported. Side rails up x 2, call light with patient. Bleeding is controlled.

## 2023-02-28 NOTE — ED Notes (Signed)
Pt continues to get up and walk around in the room. Per family pt becomes more confused in the afternoon and this is normal. Bed alarm placed under the pt.

## 2023-02-28 NOTE — ED Triage Notes (Signed)
Pt bib ems from home c/o seizure like activity. Pt was sitting at his kitchen table when his daughter noticed pt arm shaking and u/t respond to her questions. Pt came to and started to walk but started to shake and u/t respond again. Daughter stated this happened for about a minute. Pt didn't hit his head. Pt u/t recall events. Pt was recently d/c for a cut on his right foot. Hx Afib  Ems noted initial BP 100/60 received 500 cc NS final BP 139/80 Pt HR initially 130 and final rate 70 before fluids RA 100%

## 2023-02-28 NOTE — Discharge Instructions (Addendum)
You have been evaluated for your foot injury.  You have a puncture wound to your right foot that was bleeding but was successfully addressed using quick clot dressing.  Please keep the dressing on for the next 2 days.  Your hemoglobin is 9.2.  You may follow-up with your doctor for a recheck of your hemoglobin at your earliest convenience.  You do not need blood transfusion today.

## 2023-02-28 NOTE — ED Provider Notes (Signed)
[facility] Provider Note   CSN: [id number] Arrival date & time: 02/28/23  1135     History  No chief complaint on file.   [name] is a 82 y.o. male.  The history is provided by the patient and medical records. No language interpreter was used.     82 year old male with history of A-fib currently on Eliquis, brought here via EMS from home for evaluation of foot injury.  Patient report this morning he was getting out of his shower and walking barefoot on his tile floor when he felt something dripping and when he looks down he noticed he was bleeding from his right foot.  He does not endorse any significant pain but states bleeding persist and he is currently on blood thinner medication.  EMS was contacted and patient was brought here.  He denies any lightheadedness or dizziness no numbness unsure his last tetanus status.  Home Medications Prior to Admission medications   Medication Sig Start Date End Date Taking? Authorizing Provider  acetaminophen (TYLENOL) 325 MG tablet Take 2 tablets (650 mg total) by mouth every 6 (six) hours as needed for moderate pain. 03/17/22   Wallis Bamberg, PA-C  amiodarone (PACERONE) 200 MG tablet TAKE 1 TABLET(200 MG) BY MOUTH DAILY 09/21/22   Quintella Reichert, MD  apixaban (ELIQUIS) 5 MG TABS tablet Take 1 tablet (5 mg total) by mouth 2 (two) times daily. 03/16/22   Quintella Reichert, MD  ascorbic acid (VITAMIN C) 500 MG tablet Take 500 mg by mouth daily.    [provider]  atorvastatin (LIPITOR) 20 MG tablet Take 20 mg by mouth daily. 03/21/22   [provider]  atorvastatin (LIPITOR) 20 MG tablet TAKE 1 TABLET(20 MG) BY MOUTH DAILY 12/16/22   Quintella Reichert, MD  Boswellia Serrata (BOSWELLIA PO) Take 1,200 mg by mouth daily.    [provider]  carvedilol (COREG) 3.125 MG tablet Take by mouth. 01/21/22   [provider]  carvedilol (COREG) 3.125 MG tablet Take by mouth. 01/21/22    [provider]  cephALEXin (KEFLEX) 500 MG capsule Take 1 capsule (500 mg total) by mouth 3 (three) times daily. 09/29/22   Wallis Bamberg, PA-C  clofazimine 50 mg CAPS capsule (for compassionate use) Take 2 capsules (100 mg total) by mouth daily with breakfast. 11/02/22   Kuppelweiser, Cassie L, RPH-CPP  doxycycline (VIBRAMYCIN) 100 MG capsule Take 1 capsule (100 mg total) by mouth 2 (two) times daily. 03/17/22   Wallis Bamberg, PA-C  ELIQUIS 5 MG TABS tablet Take 5 mg by mouth 2 (two) times daily. 03/17/22   [provider]  furosemide (LASIX) 20 MG tablet Take 1 tablet (20 mg total) by mouth every other day. 12/11/22 03/11/23  Meriam Sprague, MD  lidocaine (LIDODERM) 5 % Place 1 patch onto the skin daily.    [provider]  Misc Natural Products (GLUCOSAMINE CHONDROITIN TRIPLE) TABS Take 1 tablet by mouth daily.    [provider]  Multiple Vitamin (MULTIVITAMIN WITH MINERALS) TABS tablet Take 1 tablet by mouth daily.    [provider]  Omadacycline Tosylate 150 MG TABS Take 2 tablets (300 mg total) by mouth daily. 02/17/23   Jennette Kettle, RPH-CPP  Omega-3 Fatty Acids (FISH OIL) 1200 MG CAPS Take 1,200 mg by mouth daily.    [provider]  PRESCRIPTION MEDICATION Take 2 tablets by mouth daily.    [provider]  Allergies    Patient has no known allergies.    Review of Systems   Review of Systems  All other systems reviewed and are negative.   Physical Exam Updated Vital Signs BP 137/63 (BP Location: Left Arm)   Pulse (!) 54   Temp 98.1 F (36.7 C) (Oral)   Ht 6\' 2"  (1.88 m)   Wt 69.9 kg   SpO2 100%   BMI 19.77 kg/m  Physical Exam Vitals and nursing note reviewed.  Constitutional:      General: He is not in acute distress.    Appearance: He is well-developed.  HENT:     Head: Atraumatic.  Eyes:     Conjunctiva/sclera: Conjunctivae normal.  Musculoskeletal:        General: Signs of injury (Right foot: The  foot is saturated with dried blood.  There is a 2 mm puncture wound noted to the arch of the foot appears very superficial but actively bleeding.  No foreign body noted.  Sensation is intact throughout.) present.     Cervical back: Neck supple.  Skin:    Findings: No rash.  Neurological:     Mental Status: He is alert.     ED Results / Procedures / Treatments   Labs (all labs ordered are listed, but only abnormal results are displayed) Labs Reviewed  I-STAT CHEM 8, ED - Abnormal; Notable for the following components:      Result Value   Hemoglobin 9.2 (*)    HCT 27.0 (*)    All other components within normal limits    EKG None  Radiology No results found.  Procedures Procedures    Medications Ordered in ED Medications - No data to display  ED Course/ Medical Decision Making/ A&P                                 Medical Decision Making Risk Prescription drug management.   BP 137/63 (BP Location: Left Arm)   Pulse (!) 54   Temp 98.1 F (36.7 C) (Oral)   Ht 6\' 2"  (1.88 m)   Wt 69.9 kg   SpO2 100%   BMI 19.77 kg/m   42:78 PM  82 year old male with history of A-fib currently on Eliquis, brought here via EMS from home for evaluation of foot injury.  Patient report this morning he was getting out of his shower and walking barefoot on his tile floor when he felt something dripping and when he looks down he noticed he was bleeding from his right foot.  He does not endorse any significant pain but states bleeding persist and he is currently on blood thinner medication.  EMS was contacted and patient was brought here.  He denies any lightheadedness or dizziness no numbness unsure his last tetanus status.  On exam this is an elderly male resting comfortably in bed appears to be in no acute discomfort.  Both of his feet was saturated with dried blood.  Blood was cleaned off his foot and he does have a small puncture wound noted to the arch of his right foot actively bleeding.   I apply quick clot gauze and pressure dressing to the affected area with adequate hemostasis.  No significant discomfort on palpation to suggest fracture or dislocation or retained foreign body.  Will update tetanus.  Initial triage note mention that patient was dizzy and he was hypotensive when EMS arrived.  At this time patient states  he is not dizzy but he is amenable for checking his CBC to make sure he does not have any significant anemia.  Currently blood pressure is normal at 137/63.  He is bradycardic with heart rate of 54.  Patient is resting comfortably.  Care discussed with Dr. Fredderick Phenix.   CBC obtained and hemoglobin is 9.2 somewhat similar to prior value.  Patient does not need blood transfusion.  He was monitored in the ED for 2 hours without any active bleeding.  He is stable to be discharged home with appropriate wound care.  Return precaution given.        Final Clinical Impression(s) / ED Diagnoses Final diagnoses:  Foot laceration, right, initial encounter    Rx / DC Orders ED Discharge Orders     None         Fayrene Helper, PA-C 02/28/23 1404    Rolan Bucco, MD 02/28/23 1751

## 2023-02-28 NOTE — ED Provider Notes (Cosign Needed Addendum)
[place] [facility]ERGENC[facility] DEPARTMENT AT [facility] Provider Note   CSN: [id number] Arrival date & time: 02/28/23  1708     History  Chief Complaint  Patient presents with   Seizures    [name] is a 82 y.o. male with medical history of ventricular tachycardia, paroxysmal atrial fibrillation on Eliquis, hypertension, chronic venous insufficiency, CHF, CAD.  Patient presents to ED for evaluation.  Patient here with his daughter provide some history.  The patient was seen this morning for cut on his right foot.  Patient was discharged home.  Patient states that upon discharge home he still felt "woozy".  Patient reports that when he got home, he was sitting down at his computer when he had a sudden episode of shaking of his right arm.  Patient daughter at bedside confirms this, states that she witnessed it.  Patient daughter states that the shaking lasted for about 20 seconds and then resolved.  Patient reports that he was aware this was occurring when it happened, he could hear his family calling out to him but could not respond.  He states that this lasted for maybe 20 seconds and then resolved and he felt very confused for about 15 minutes afterwards.  The patient daughter states that after the initial shaking episode ceased, the patient stood up and began walking to the bathroom and then defecated on himself.  The patient then began shaking once more and he also reached out to grab the wall at this time and this again lasted for maybe 15 seconds and then resolved.  The patient was very confused upon this shaking event seizing.  Nursing staff states that the patient was very confused upon arrival here to the ED.  The patient endorses feeling confused after the second shaking event as well.  The patient daughter states that the patient has had issues with memory recently however not as severe as today's episode.  Patient currently alert and oriented x 4.  Denies one-sided weakness or  numbness, chest pain, shortness of breath.  Per EMS, patient was hypoxic on arrival with oxygen saturation of 70%.  He is endorsing lightheadedness and dizziness but denies any shortness of breath.  He has no leg swelling.  He is taking a blood thinner and he was seen here this morning for cut to his right foot that was bleeding significantly.  Has a history of anemia.   Seizures      Home Medications Prior to Admission medications   Medication Sig Start Date End Date Taking? Authorizing Provider  acetaminophen (TYLENOL) 325 MG tablet Take 2 tablets (650 mg total) by mouth every 6 (six) hours as needed for moderate pain. 03/17/22   Wallis Bamberg, PA-C  amiodarone (PACERONE) 200 MG tablet TAKE 1 TABLET(200 MG) BY MOUTH DAILY 09/21/22   Quintella Reichert, MD  apixaban (ELIQUIS) 5 MG TABS tablet Take 1 tablet (5 mg total) by mouth 2 (two) times daily. 03/16/22   Quintella Reichert, MD  ascorbic acid (VITAMIN C) 500 MG tablet Take 500 mg by mouth daily.    [provider]  atorvastatin (LIPITOR) 20 MG tablet Take 20 mg by mouth daily. 03/21/22   [provider]  atorvastatin (LIPITOR) 20 MG tablet TAKE 1 TABLET(20 MG) BY MOUTH DAILY 12/16/22   Quintella Reichert, MD  Boswellia Serrata (BOSWELLIA PO) Take 1,200 mg by mouth daily.    [provider]  carvedilol (COREG) 3.125 MG tablet Take by mouth. 01/21/22  [provider]  carvedilol (COREG) 3.125 MG tablet Take by mouth. 01/21/22   [provider]  cephALEXin (KEFLEX) 500 MG capsule Take 1 capsule (500 mg total) by mouth 3 (three) times daily. 09/29/22   Wallis Bamberg, PA-C  clofazimine 50 mg CAPS capsule (for compassionate use) Take 2 capsules (100 mg total) by mouth daily with breakfast. 11/02/22   Kuppelweiser, Cassie L, RPH-CPP  doxycycline (VIBRAMYCIN) 100 MG capsule Take 1 capsule (100 mg total) by mouth 2 (two) times daily. 03/17/22   Wallis Bamberg, PA-C  ELIQUIS 5 MG TABS tablet Take 5 mg by mouth 2 (two) times daily.  03/17/22   [provider]  furosemide (LASIX) 20 MG tablet Take 1 tablet (20 mg total) by mouth every other day. 12/11/22 03/11/23  Meriam Sprague, MD  lidocaine (LIDODERM) 5 % Place 1 patch onto the skin daily.    [provider]  Misc Natural Products (GLUCOSAMINE CHONDROITIN TRIPLE) TABS Take 1 tablet by mouth daily.    [provider]  Multiple Vitamin (MULTIVITAMIN WITH MINERALS) TABS tablet Take 1 tablet by mouth daily.    [provider]  Omadacycline Tosylate 150 MG TABS Take 2 tablets (300 mg total) by mouth daily. 02/17/23   Jennette Kettle, RPH-CPP  Omega-3 Fatty Acids (FISH OIL) 1200 MG CAPS Take 1,200 mg by mouth daily.    [provider]  PRESCRIPTION MEDICATION Take 2 tablets by mouth daily.    [provider]      Allergies    Patient has no known allergies.    Review of Systems   Review of Systems  Constitutional:  Negative for fever.  Respiratory:  Negative for shortness of breath.   Cardiovascular:  Negative for chest pain.  Gastrointestinal:  Negative for abdominal pain, nausea and vomiting.  Neurological:  Positive for seizures, syncope and weakness. Negative for dizziness, light-headedness and numbness.  All other systems reviewed and are negative.   Physical Exam Updated Vital Signs BP 132/78   Pulse 66   Temp 97.7 F (36.5 C) (Oral)   Resp 13   SpO2 95%  Physical Exam Vitals and nursing note reviewed.  Constitutional:      General: He is not in acute distress.    Appearance: He is not ill-appearing, toxic-appearing or diaphoretic.     Comments: Thin appearing individual   HENT:     Head: Normocephalic and atraumatic.     Nose: Nose normal.     Mouth/Throat:     Mouth: Mucous membranes are moist.     Pharynx: Oropharynx is clear.  Eyes:     Extraocular Movements: Extraocular movements intact.     Conjunctiva/sclera: Conjunctivae normal.     Pupils: Pupils are equal, round, and reactive to  light.  Cardiovascular:     Rate and Rhythm: Normal rate. Rhythm irregular.  Pulmonary:     Effort: Pulmonary effort is normal.     Breath sounds: Normal breath sounds. No wheezing.  Abdominal:     General: Abdomen is flat. Bowel sounds are normal.     Palpations: Abdomen is soft.     Tenderness: There is no abdominal tenderness.  Musculoskeletal:     Cervical back: Normal range of motion and neck supple.  Skin:    General: Skin is warm and dry.     Capillary Refill: Capillary refill takes less than 2 seconds.  Neurological:     General: No focal deficit present.     Mental Status:  He is oriented to person, place, and time.     GCS: GCS eye subscore is 4. GCS verbal subscore is 5. GCS motor subscore is 6.     Cranial Nerves: Cranial nerves 2-12 are intact. No cranial nerve deficit.     Sensory: Sensation is intact. No sensory deficit.     Motor: Motor function is intact. No weakness.     Coordination: Coordination is intact. Heel to Shin Test normal.     ED Results / Procedures / Treatments   Labs (all labs ordered are listed, but only abnormal results are displayed) Labs Reviewed  BASIC METABOLIC PANEL - Abnormal; Notable for the following components:      Result Value   Glucose, Bld 123 (*)    Calcium 7.9 (*)    All other components within normal limits  CBC - Abnormal; Notable for the following components:   RBC 3.12 (*)    Hemoglobin 8.1 (*)    HCT 27.4 (*)    MCHC 29.6 (*)    RDW 15.6 (*)    All other components within normal limits  BRAIN NATRIURETIC PEPTIDE - Abnormal; Notable for the following components:   B Natriuretic Peptide 445.7 (*)    All other components within normal limits  CBG MONITORING, ED  TYPE AND SCREEN  PREPARE RBC (CROSSMATCH)  TROPONIN I (HIGH SENSITIVITY)    EKG None  Radiology CT Head Wo Contrast  Result Date: 02/28/2023 CLINICAL DATA:  New onset seizure EXAM: CT HEAD WITHOUT CONTRAST TECHNIQUE: Contiguous axial images were  obtained from the base of the skull through the vertex without intravenous contrast. RADIATION DOSE REDUCTION: This exam was performed according to the departmental dose-optimization program which includes automated exposure control, adjustment of the mA and/or kV according to patient size and/or use of iterative reconstruction technique. COMPARISON:  None Available. FINDINGS: Brain: There is no acute intracranial hemorrhage, extra-axial fluid collection, or acute infarct. Parenchymal volume is normal. The ventricles are normal in size. Gray-white differentiation is preserved. The pituitary and suprasellar region are normal. There is no mass lesion. There is no mass effect or midline shift. Vascular: There is calcification of the bilateral carotid siphons. Skull: Normal. Negative for fracture or focal lesion. Sinuses/Orbits: The paranasal sinuses are clear. The globes and orbits are unremarkable. Other: The mastoid air cells and middle ear cavities are clear. IMPRESSION: No acute intracranial pathology. Electronically Signed   By: Lesia Hausen M.D.   On: 02/28/2023 19:19   DG Chest 2 View  Result Date: 02/28/2023 CLINICAL DATA:  Seizure-like activity EXAM: CHEST - 2 VIEW COMPARISON:  Chest radiograph 01/25/2023 FINDINGS: The patient is rotated to the left. The cardiomediastinal silhouette is stable. There is no focal consolidation or pulmonary edema. There is no pleural effusion or pneumothorax There is no acute osseous abnormality. Thoracic dextrocurvature is unchanged. IMPRESSION: Stable chest with no radiographic evidence of acute cardiopulmonary process. Electronically Signed   By: Lesia Hausen M.D.   On: 02/28/2023 19:13    Procedures .Critical Care  Performed by: Al Decant, PA-C Authorized by: Al Decant, PA-C   Critical care provider statement:    Critical care time (minutes):  85   Critical care time was exclusive of:  Separately billable procedures and treating other  patients   Critical care was necessary to treat or prevent imminent or life-threatening deterioration of the following conditions:  Respiratory failure   Critical care was time spent personally by me on the following activities:  Blood  draw for specimens, development of treatment plan with patient or surrogate, discussions with consultants, discussions with primary provider, evaluation of patient's response to treatment, examination of patient, interpretation of cardiac output measurements, obtaining history from patient or surrogate, review of old charts, ordering and review of radiographic studies, ordering and review of laboratory studies, ordering and performing treatments and interventions, pulse oximetry and re-evaluation of patient's condition   I assumed direction of critical care for this patient from another provider in my specialty: no     Care discussed with: admitting provider      Medications Ordered in ED Medications  0.9 %  sodium chloride infusion (Manually program via Guardrails IV Fluids) (has no administration in time range)  iohexol (OMNIPAQUE) 350 MG/ML injection 75 mL (75 mLs Intravenous Contrast Given 02/28/23 2220)    ED Course/ Medical Decision Making/ A&P Clinical Course as of 02/28/23 2248  Sun Feb 28, 2023  2010 Discussed with Dr. Otelia Limes. States he feels this would be a highly atypical presentation for seizures. He favors altered mental status, weakness, possibly presyncope however self defecation makes this not as clear cut. Has offered MRI, EEG depending on if family wishes to remain here to be admitted. Family currently discussing it. [CG]  2147 Patient does desaturate to 85% on ambulation. No evidence of consolidation or effusion on cxray. Will collect cta to further assess. [CG]    Clinical Course User Index [CG] Al Decant, PA-C    Medical Decision Making Amount and/or Complexity of Data Reviewed Labs: ordered. Radiology:  ordered.  Risk Prescription drug management. Decision regarding hospitalization.   82 year old presents to ED for evaluation.  Please see HPI for further details.  On examination patient arrives afebrile, nontachycardic in A-fib.  Lung sounds are clear bilaterally however he has an oxygen saturation of 92% on room air.  Abdomen is soft and compressible throughout.  Neurological examination at baseline without focal neurodeficits.  He is alert and oriented x 4.  Overall nontoxic in appearance.  Patient presentation concerning for seizure versus presyncope.  Also patient hypoxic.  Will assess further.  Patient CBC shows no leukocytosis, hemoglobin 8.1.  8 hours ago the patient hemoglobin was 9.2.  Will provide patient 1 unit PRBCs.  Patient metabolic panel unremarkable, electrolyte derangement not present, elevated creatinine not present, anion gap 10.  Patient troponin 13, he denies any chest pain during this event.  His EKG is nonischemic.  His chest x-ray is unremarkable.  Patient CT scan of head unremarkable.  Patient case discussed with Dr. Otelia Limes, neurology.  Dr. Otelia Limes has offered admission for MRI, EEG.  Patient family has accepted this at this time.  The patient be admitted.  Patient is here hypoxic, in the setting of a clean chest x-ray.  He also has some serosanguineous fluid draining out of his bilateral lower extremities however they do not appear edematous.  Will collect BNP, CTA to assess for further underlying cause.  BNP is elevated at 445.7.  There is no baseline to compare this to.  The patient CTA is pending at this time.  Patient case discussed with Dr. Para March, hospitalist, who has agreed to admit patient for further workup and management.  The patient is amenable to this plan.   Final Clinical Impression(s) / ED Diagnoses Final diagnoses:  Anemia, unspecified type  Hypoxia  Near syncope    Rx / DC Orders ED Discharge Orders     None           Laetitia Schnepf,  Zoe Lan, PA-C 02/28/23 2312    Franne Forts, DO 03/03/23 [phone]

## 2023-03-01 ENCOUNTER — Inpatient Hospital Stay (HOSPITAL_COMMUNITY): Payer: Medicare HMO

## 2023-03-01 ENCOUNTER — Observation Stay (HOSPITAL_COMMUNITY): Payer: Medicare HMO

## 2023-03-01 DIAGNOSIS — R9089 Other abnormal findings on diagnostic imaging of central nervous system: Secondary | ICD-10-CM | POA: Diagnosis not present

## 2023-03-01 DIAGNOSIS — R569 Unspecified convulsions: Secondary | ICD-10-CM | POA: Diagnosis not present

## 2023-03-01 DIAGNOSIS — R55 Syncope and collapse: Secondary | ICD-10-CM | POA: Diagnosis not present

## 2023-03-01 LAB — HEMOGLOBIN AND HEMATOCRIT, BLOOD
HCT: 27.6 % — ABNORMAL LOW (ref 39.0–52.0)
Hemoglobin: 8.7 g/dL — ABNORMAL LOW (ref 13.0–17.0)

## 2023-03-01 MED ORDER — AMIODARONE HCL 200 MG PO TABS
200.0000 mg | ORAL_TABLET | Freq: Every day | ORAL | Status: DC
  Administered 2023-03-01 – 2023-03-03 (×3): 200 mg via ORAL
  Filled 2023-03-01 (×3): qty 1

## 2023-03-01 MED ORDER — GADOBUTROL 1 MMOL/ML IV SOLN
7.0000 mL | Freq: Once | INTRAVENOUS | Status: AC | PRN
  Administered 2023-03-01: 7 mL via INTRAVENOUS

## 2023-03-01 MED ORDER — FUROSEMIDE 20 MG PO TABS
20.0000 mg | ORAL_TABLET | ORAL | Status: DC
  Administered 2023-03-01 – 2023-03-03 (×2): 20 mg via ORAL
  Filled 2023-03-01 (×2): qty 1

## 2023-03-01 MED ORDER — SODIUM CHLORIDE 0.9 % IV SOLN
75.0000 mL/h | INTRAVENOUS | Status: DC
  Administered 2023-03-01: 75 mL/h via INTRAVENOUS

## 2023-03-01 MED ORDER — LORAZEPAM 2 MG/ML IJ SOLN
0.5000 mg | INTRAMUSCULAR | Status: DC | PRN
  Administered 2023-03-01: 0.5 mg via INTRAVENOUS
  Filled 2023-03-01: qty 1

## 2023-03-01 MED ORDER — APIXABAN 5 MG PO TABS
5.0000 mg | ORAL_TABLET | Freq: Two times a day (BID) | ORAL | Status: DC
  Administered 2023-03-01 (×2): 5 mg via ORAL
  Filled 2023-03-01 (×3): qty 1

## 2023-03-01 MED ORDER — ORAL CARE MOUTH RINSE: 15.0000 mL | OROMUCOSAL | Status: DC | PRN

## 2023-03-01 MED ORDER — ATORVASTATIN CALCIUM 10 MG PO TABS: 20.0000 mg | ORAL_TABLET | Freq: Every day | ORAL | Status: DC

## 2023-03-01 MED ORDER — ORAL CARE MOUTH RINSE
15.0000 mL | OROMUCOSAL | Status: DC
  Administered 2023-03-02 (×2): 15 mL via OROMUCOSAL

## 2023-03-01 MED ORDER — ACETAMINOPHEN 650 MG RE SUPP: 650.0000 mg | RECTAL | Status: DC | PRN

## 2023-03-01 MED ORDER — CARVEDILOL 3.125 MG PO TABS
3.1250 mg | ORAL_TABLET | Freq: Two times a day (BID) | ORAL | Status: DC
  Administered 2023-03-01 – 2023-03-03 (×4): 3.125 mg via ORAL
  Filled 2023-03-01 (×4): qty 1

## 2023-03-01 MED ORDER — ATORVASTATIN CALCIUM 10 MG PO TABS
20.0000 mg | ORAL_TABLET | Freq: Every day | ORAL | Status: DC
  Administered 2023-03-01 – 2023-03-03 (×3): 20 mg via ORAL
  Filled 2023-03-01 (×3): qty 2

## 2023-03-01 MED ORDER — LORAZEPAM 2 MG/ML IJ SOLN: 2.0000 mg | INTRAMUSCULAR | Status: DC | PRN

## 2023-03-01 MED ORDER — ACETAMINOPHEN 325 MG PO TABS
650.0000 mg | ORAL_TABLET | ORAL | Status: DC | PRN
  Administered 2023-03-02: 650 mg via ORAL
  Filled 2023-03-01: qty 2

## 2023-03-01 NOTE — Assessment & Plan Note (Addendum)
No complaints of chest pain, EKG with no ischemic changes Continue apixaban, atorvastatin and carvedilol

## 2023-03-01 NOTE — ED Notes (Signed)
Tech got pt up and took to bathroom.

## 2023-03-01 NOTE — Care Management CC44 (Signed)
Condition Code 44 Documentation Completed  Patient Details  Name: [name] MRN: [id number] Date of Birth: 10-Mar-1941   Condition Code 44 given:  Yes Patient signature on Condition Code 44 notice:  Yes Documentation of 2 MD's agreement:  Yes Code 44 added to claim:  Yes    Michel Bickers, RN 03/01/2023, 3:28 PM

## 2023-03-01 NOTE — Progress Notes (Signed)
EEG complete - results pending 

## 2023-03-01 NOTE — ED Notes (Signed)
ED TO INPATIENT HANDOFF REPORT  ED Nurse Name and Phone #: Dot Lanes, paramedic  S Name/Age/Gender [name] 82 y.o. male Room/Bed: 003C/003C  Code Status   Code Status: Full Code  Home/SNF/Other Home Patient oriented to: self, place, time, and situation Is this baseline? Yes   Triage Complete: Triage complete  Chief Complaint Seizure-like activity [facility]) [R56.9]  Triage Note Pt bib ems from home c/o seizure like activity. Pt was sitting at his kitchen table when his daughter noticed pt arm shaking and u/t respond to her questions. Pt came to and started to walk but started to shake and u/t respond again. Daughter stated this happened for about a minute. Pt didn't hit his head. Pt u/t recall events. Pt was recently d/c for a cut on his right foot. Hx Afib  Ems noted initial BP 100/60 received 500 cc NS final BP 139/80 Pt HR initially 130 and final rate 70 before fluids RA 100%   Allergies No Known Allergies  Level of Care/Admitting Diagnosis ED Disposition     ED Disposition  Admit   Condition  --   Comment  Hospital Area: [facility] [[id number]]  Level of Care: Progressive [102]  Admit to Progressive based on following criteria: NEUROLOGICAL AND NEUROSURGICAL complex patients with significant risk of instability, who do not meet ICU criteria, yet require close observation or frequent assessment (< / = every 2 - 4 hours) with medical / nursing intervention.  May admit patient to [facility] or [facility] if equivalent level of care is available:: No  Covid Evaluation: Asymptomatic - no recent exposure (last 10 days) testing not required  Diagnosis: Seizure-like activity [facility]) [[id number]]  Admitting Physician: Andris Baumann [[id number]]  Attending Physician: Andris Baumann [[id number]]  Certification:: I certify this patient will need inpatient services for at least 2 midnights  Estimated Length of Stay: 2          B Medical/Surgery History Past  Medical History:  Diagnosis Date   Ascending aorta dilatation (HCC)    44mm by chest CT angio 04/2018   CAD (coronary artery disease)    a. 05/2006 Abnl stress test w/ 2-47mm ST dep; b. 05/2006 Cath/PCI: LM nl, LAD 30-40ost, 20-30p, 90d (small), D1 nl, D2 nl, LCX nl, OM1/2 nl, RCA Ca2+, 14m/d (3.5x16 Liberty BMS & 3.5x12 Liberty BMS), EF 50%.   Chronic diastolic CHF (congestive heart failure) (HCC)    a. 10/2017 Echo: EF 50-55%, mild AI, sev dil LA, mod dil RA.   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 CHA2DS2VASc 5-->Eliquis; b. Prev WCT on flecainide;  c. 12/2017 s/p DCCV.   Pneumothorax    Prolonged Q-T interval on ECG 06/22/2022   Ventricular tachycardia (HCC)    a. possibly proarrhythmia from flecainide.   Past Surgical History:  Procedure Laterality Date   CARDIOVERSION N/A 12/23/2017   Procedure: CARDIOVERSION;  Surgeon: Quintella Reichert, MD;  Location: [facility] ENDOSCOPY;  Service: Cardiovascular;  Laterality: N/A;   CARDIOVERSION N/A 03/08/2018   Procedure: CARDIOVERSION;  Surgeon: Quintella Reichert, MD;  Location: [facility] ENDOSCOPY;  Service: Cardiovascular;  Laterality: N/A;   INGUINAL HERNIA REPAIR     x2   left total hip surgery     TEE WITHOUT CARDIOVERSION N/A 03/08/2018   Procedure: TRANSESOPHAGEAL ECHOCARDIOGRAM (TEE);  Surgeon: Quintella Reichert, MD;  Location[facility] ENDOSCOPY;  Service: Cardiovascular;  Laterality: N/A;   TOTAL HIP ARTHROPLASTY Right 08/19/2020   Procedure: TOTAL HIP ARTHROPLASTY ANTERIOR APPROACH;  Surgeon: Ollen Gross, MD;  Location: [facility] ORS;  Service: Orthopedics;  Laterality: Right;    TOTAL KNEE ARTHROPLASTY Left 02/28/2018   Procedure: LEFT TOTAL KNEE ARTHROPLASTY;  Surgeon: Ollen Gross, MD;  Location: [facility] ORS;  Service: Orthopedics;  Laterality: Left;   VEIN LIGATION AND STRIPPING Left 04/08/2020   Procedure: LEFT LOWER EXTREMITY VEIN LIGATION AND  EXCISION OF ULCER;  Surgeon: Chuck Hint, MD;  Location: [facility] OR;  Service: Vascular;  Laterality: Left;     A IV Location/Drains/Wounds Patient Lines/Drains/Airways Status     Active Line/Drains/Airways     Name Placement date Placement time Site Days   Peripheral IV 02/28/23 20 G Left Antecubital 02/28/23  1809  Antecubital  1   Peripheral IV 02/28/23 22 G Anterior;Right Forearm 02/28/23  1809  Forearm  1            Intake/Output Last 24 hours  Intake/Output Summary (Last 24 hours) at 03/01/2023 1355 Last data filed at 03/01/2023 1232 Gross per 24 hour  Intake 312.5 ml  Output 800 ml  Net -487.5 ml    Labs/Imaging Results for orders placed or performed during the hospital encounter of 02/28/23 (from the past 48 hour(s))  CBG monitoring, ED     Status: None   Collection Time: 02/28/23  5:51 PM  Result Value Ref Range   Glucose-Capillary 98 70 - 99 mg/dL    Comment: Glucose reference range applies only to samples taken after fasting for at least 8 hours.   Comment 1 Notify RN    Comment 2 Document in Chart   Basic metabolic panel - if new onset seizures     Status: Abnormal   Collection Time: 02/28/23  6:16 PM  Result Value Ref Range   Sodium 138 135 - 145 mmol/L   Potassium 3.9 3.5 - 5.1 mmol/L   Chloride 106 98 - 111 mmol/L   CO2 22 22 - 32 mmol/L   Glucose, Bld 123 (H) 70 - 99 mg/dL    Comment: Glucose reference range applies only to samples taken after fasting for at least 8 hours.   BUN 14 8 - 23 mg/dL   Creatinine, Ser 0.98 0.61 - 1.24 mg/dL   Calcium 7.9 (L) 8.9 - 10.3 mg/dL   GFR, Estimated >11 >91 mL/min    Comment: (NOTE) Calculated using the CKD-EPI Creatinine Equation (2021)    Anion gap 10 5 - 15    Comment: Performed at [facility], [address], [place], [place] [address]  CBC - if new onset seizures     Status: Abnormal   Collection Time: 02/28/23  6:16 PM  Result Value Ref Range   WBC 9.3 4.0 - 10.5 K/uL   RBC 3.12 (L) 4.22  - 5.81 MIL/uL   Hemoglobin 8.1 (L) 13.0 - 17.0 g/dL   HCT 56.2 (L) 13.0 - 86.5 %   MCV 87.8 80.0 - 100.0 fL   MCH 26.0 26.0 - 34.0 pg   MCHC 29.6 (L) 30.0 - 36.0 g/dL   RDW 78.4 (H) 69.6 - 29.5 %   Platelets 216 150 - 400 K/uL   nRBC 0.0 0.0 - 0.2 %    Comment: Performed at [facility], [address], [place], [place] [address]  Troponin I (High Sensitivity)     Status: None   Collection Time: 02/28/23  6:16 PM  Result Value Ref Range   Troponin I (High Sensitivity) 13 <18 ng/L    Comment: (NOTE) Elevated high sensitivity troponin I (hsTnI) values and significant  changes across serial measurements may suggest ACS but many other  chronic and acute conditions are known to elevate hsTnI results.  Refer to the "Links" section for chest pain algorithms and additional  guidance. Performed at [facility], [address], [place], [place] [address]   Prepare RBC (crossmatch)     Status: None   Collection Time: 02/28/23  7:48 PM  Result Value Ref Range   Order Confirmation      ORDER PROCESSED BY BLOOD BANK Performed at [facility], [address], [place], [place] [address]   Brain natriuretic peptide     Status: Abnormal   Collection Time: 02/28/23  8:13 PM  Result Value Ref Range   B Natriuretic Peptide 445.7 (H) 0.0 - 100.0 pg/mL    Comment: Performed at [facility], [address], [place], [place] [address]  Type and screen [facility]     Status: None (Preliminary result)   Collection Time: 02/28/23  8:14 PM  Result Value Ref Range   ABO/RH(D) O POS    Antibody Screen NEG    Sample Expiration 03/03/2023,2359    Unit Number [id number]    Blood Component Type RBC LR PHER1    Unit division 00    Status of Unit ISSUED    Transfusion Status OK TO TRANSFUSE    Crossmatch Result      Compatible Performed at [facility], [address], [place], [place] [address]    EEG adult  Result Date: 03/01/2023 Charlsie Quest, MD     03/01/2023  1:32 PM Patient Name: [name] MRN: [id number] Epilepsy Attending: Charlsie Quest Referring Physician/Provider: Andris Baumann, MD Date: 03/01/2023 Duration: 22.50 mins Patient history: 82 year old male with a history of PAF presenting after an approximately 1 minute duration period of awake unresponsiveness with arm shaking at home, during which he was able to maintain a standing position. EEG to evaluate for seizure Level of alertness: Awake, asleep AEDs during EEG study: Ativan Technical aspects: This EEG study was done with scalp electrodes positioned according to the 10-20 International system of electrode placement. Electrical activity was reviewed with band pass filter of 1-70Hz , sensitivity of 7 uV/mm, display speed of 33mm/sec with a 60Hz  notched filter applied as appropriate. EEG data were recorded continuously and digitally stored.  Video monitoring was available and reviewed as appropriate. Description: No clear posterior dominant rhythm was seen. Sleep was characterized by vertex waves, sleep spindles (12 to 14 Hz), maximal frontocentral region.  There is an excessive amount of 15 to 18 Hz beta activity distributed symmetrically and diffusely.  Hyperventilation and photic stimulation were not performed.   Of note, parts of study were difficult due to significant myogenic artifact. ABNORMALITY - Excessive beta, generalized IMPRESSION: This study is within normal limits. The excessive beta activity seen in the background is most likely due to the effect of benzodiazepine and is a benign EEG pattern. No seizures or epileptiform discharges were seen throughout the recording. A normal interictal EEG does not exclude the diagnosis of epilepsy. Charlsie Quest   CT Angio Chest PE W and/or Wo Contrast  Result Date: 02/28/2023 CLINICAL DATA:  Seizure-like activity EXAM: CT ANGIOGRAPHY CHEST WITH CONTRAST TECHNIQUE: Multidetector CT imaging of the chest was performed  using the  standard protocol during bolus administration of intravenous contrast. Multiplanar CT image reconstructions and MIPs were obtained to evaluate the vascular anatomy. RADIATION DOSE REDUCTION: This exam was performed according to the departmental dose-optimization program which includes automated exposure control, adjustment of the mA and/or kV according to patient size and/or use of iterative reconstruction technique. CONTRAST:  75mL OMNIPAQUE IOHEXOL 350 MG/ML SOLN COMPARISON:  Chest x-ray from earlier in the same day, CT from 08/28/2022. FINDINGS: Cardiovascular: Atherosclerotic calcifications of the thoracic aorta are noted. Dilatation of the ascending aorta is seen to 4.6 cm in diameter. This is roughly stable from the prior exam. Normal tapering is noted in the distal thoracic aortic arch. Descending thoracic aorta shows no aneurysmal dilatation. Pulmonary artery shows a normal branching pattern. No intraluminal filling defect is identified to suggest pulmonary embolism. Heavy coronary calcifications are noted. Cardiac enlargement is seen Mediastinum/Nodes: Thoracic inlet is within normal limits. No hilar or mediastinal adenopathy is noted. The esophagus as visualized is within normal limits. Lungs/Pleura: Lungs are well aerated bilaterally. No focal infiltrate is seen. Tiny left effusion is noted new from the prior exam. Upper Abdomen: Visualized upper abdomen shows atrophy of the left kidney with associated renal cystic change stable from the prior exam. No acute abnormality is noted. Musculoskeletal: Degenerative changes of the thoracic spine are noted. No acute rib abnormality is noted. Review of the MIP images confirms the above findings. IMPRESSION: No evidence of pulmonary embolism. Stable dilatation of the ascending aorta to 4.6 cm. Ascending thoracic aortic aneurysm. Recommend semi-annual imaging followup by CTA or MRA and referral to cardiothoracic surgery if not already obtained. This recommendation  follows 2010 ACCF/AHA/AATS/ACR/ASA/SCA/SCAI/SIR/STS/SVM Guidelines for the Diagnosis and Management of Patients With Thoracic Aortic Disease. Circulation. 2010; 121: Z610-R604. Aortic aneurysm NOS (ICD10-I71.9) Tiny left pleural effusion is noted. No other focal abnormality is seen. 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 Electronically Signed   By: Alcide Clever M.D.   On: 02/28/2023 22:35   CT Head Wo Contrast  Result Date: 02/28/2023 CLINICAL DATA:  New onset seizure EXAM: CT HEAD WITHOUT CONTRAST TECHNIQUE: Contiguous axial images were obtained from the base of the skull through the vertex without intravenous contrast. RADIATION DOSE REDUCTION: This exam was performed according to the departmental dose-optimization program which includes automated exposure control, adjustment of the mA and/or kV according to patient size and/or use of iterative reconstruction technique. COMPARISON:  None Available. FINDINGS: Brain: There is no acute intracranial hemorrhage, extra-axial fluid collection, or acute infarct. Parenchymal volume is normal. The ventricles are normal in size. Gray-white differentiation is preserved. The pituitary and suprasellar region are normal. There is no mass lesion. There is no mass effect or midline shift. Vascular: There is calcification of the bilateral carotid siphons. Skull: Normal. Negative for fracture or focal lesion. Sinuses/Orbits: The paranasal sinuses are clear. The globes and orbits are unremarkable. Other: The mastoid air cells and middle ear cavities are clear. IMPRESSION: No acute intracranial pathology. Electronically Signed   By: Lesia Hausen M.D.   On: 02/28/2023 19:19   DG Chest 2 View  Result Date: 02/28/2023 CLINICAL DATA:  Seizure-like activity EXAM: CHEST - 2 VIEW COMPARISON:  Chest radiograph 01/25/2023 FINDINGS: The patient is rotated to the left. The cardiomediastinal silhouette is stable. There is no focal consolidation or pulmonary edema. There is no pleural  effusion or pneumothorax There is no acute osseous abnormality. Thoracic dextrocurvature is unchanged. IMPRESSION: Stable chest with no radiographic evidence of acute cardiopulmonary process. Electronically Signed   By:  Lesia Hausen M.D.   On: 02/28/2023 19:13    Pending Labs Unresulted Labs (From admission, onward)     Start     Ordered   03/02/23 0500  CBC  Tomorrow morning,   R        03/01/23 1044            Vitals/Pain Today's Vitals   03/01/23 0900 03/01/23 1000 03/01/23 1041 03/01/23 1250  BP: (!) 174/79 (!) 161/82 (!) 161/82 125/83  Pulse: (!) 59 62  64  Resp: 16 15  17   Temp:    97.6 F (36.4 C)  TempSrc:    Oral  SpO2: 98% 100%  100%  Weight:    154 lb (69.9 kg)  Height:    6\' 2"  (1.88 m)  PainSc:    0-No pain    Isolation Precautions No active isolations  Medications Medications  0.9 %  sodium chloride infusion (Manually program via Guardrails IV Fluids) ( Intravenous Not Given 03/01/23 0437)  atorvastatin (LIPITOR) tablet 20 mg (20 mg Oral Given 03/01/23 1053)  carvedilol (COREG) tablet 3.125 mg (3.125 mg Oral Given 03/01/23 1041)  apixaban (ELIQUIS) tablet 5 mg (5 mg Oral Given 03/01/23 1052)  Oral care mouth rinse (15 mLs Mouth Rinse Not Given 03/01/23 1318)  Oral care mouth rinse (has no administration in time range)  LORazepam (ATIVAN) injection 2 mg (has no administration in time range)  acetaminophen (TYLENOL) tablet 650 mg (has no administration in time range)    Or  acetaminophen (TYLENOL) suppository 650 mg (has no administration in time range)  LORazepam (ATIVAN) injection 0.5 mg (0.5 mg Intravenous Given 03/01/23 0213)  amiodarone (PACERONE) tablet 200 mg (200 mg Oral Given 03/01/23 1041)  furosemide (LASIX) tablet 20 mg (20 mg Oral Given 03/01/23 1041)  iohexol (OMNIPAQUE) 350 MG/ML injection 75 mL (75 mLs Intravenous Contrast Given 02/28/23 2220)    Mobility walks with device     Focused Assessments    R Recommendations: See Admitting  Provider Note  Report given to:   Additional Notes:

## 2023-03-01 NOTE — Assessment & Plan Note (Signed)
No swelling noted in lower extremities

## 2023-03-01 NOTE — ED Notes (Addendum)
EEG at bedside.

## 2023-03-01 NOTE — Progress Notes (Signed)
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  Also recently had a cut on his foot with significant blood loss.  Report of shaking of arms that lasted about 20 seconds.  Patient was confused after the incident.  CT head did not show any acute intracranial findings.  Neurology consulted for the concern of seizure.  Plan for MRI of the brain  Assessment & Plan:  Principal Problem:   Seizure-like activity (HCC) Active Problems:   Witnessed seizure-like activity (HCC)   ABLA (acute blood loss anemia), symptomatic   Acute respiratory failure with hypoxia (HCC)   Laceration of right foot   Essential hypertension   Chronic venous insufficiency   Coronary artery disease involving native coronary artery of native heart without angina pectoris   (HFpEF) heart failure with preserved ejection fraction (HCC)   Persistent atrial fibrillation (HCC)   Thoracic aortic aneurysm (HCC)  Seizure-like activity: No history of seizures in the past.  Scenario as above.  Neurology following.  Continue Ativan  for as needed for seizure.  He is mostly oriented at present.  But noticed to have some memory deficits.  Intermittently confused  Acute blood loss anemia: History of right foot laceration on 02/28/2023.  He was given a unit of blood transfusion.  Hemoglobin currently stable in the range of 9  Acute hypoxic respiratory failure: Hypoxic on arrival with saturation in the 70s.  CT chest negative for PE or pneumonia.  Currently on room air  Thoracic aortic aneurysm: CTA showing stable aneurysm at  4.6.  He follows with cardiothoracic surgery.  Persistent A-fib: Rate is controlled with carvedilol, amiodarone.  On Eliquis for anticoagulation.  Monitor on telemetry  Chronic diastolic CHF:  Continue carvedilol, Lasix.Elevated BNP but appears euvolemic  Coronary artery disease: No anginal symptoms.  On Lipitor, carvedilol  Hypertension: Currently blood pressure stable.  Continue current medications  Chronic bilateral lower extremity venous insufficiency: Stable         DVT prophylaxis: apixaban (ELIQUIS) tablet 5 mg     Code Status: Full Code  Family Communication: Called daughter on phone for update,call not received  Patient status:Inpatient  Patient is from :home  Anticipated discharge [id number]  Estimated DC date:1-2 days   Consultants: Neurology  Procedures:None  Antimicrobials:  Anti-infectives (From admission, onward)    None       Subjective: Patient seen and examined at the bedside today.  He was lying in bed.  He was overall comfortable.  He was not happy because his room was moved.  He was not in any apparent distress.  He had a fluent conversation, he stated he was not sure why he was brought to the hospital.  Objective: Vitals:   03/01/23 0431 03/01/23 0600 03/01/23 0647 03/01/23 0752  BP: (!) 155/72 (!) 163/66 (!) 163/66   Pulse: 85 (!) 58 (!) 45   Resp: 15 13 15    Temp:    97.9 F (36.6 C)  TempSrc:    Oral  SpO2: 91% 99% 100%     Intake/Output Summary (Last 24 hours) at 03/01/2023 0806 Last data filed at 03/01/2023 0430 Gross per 24 hour  Intake 312.5 ml  Output --  Net 312.5 ml   There were no vitals filed for this visit.  Examination:  General exam: Overall comfortable, not in distress HEENT: PERRL Respiratory system:  no wheezes or crackles  Cardiovascular system: S1 & S2 heard, RRR.  Gastrointestinal system: Abdomen is nondistended, soft and nontender. Central nervous system: Alert and mostly oriented.  Not agitated but  noticed to have some memory deficits Extremities: No edema, no clubbing ,no cyanosis,cut injury on the right foot Skin: No rashes, no ulcers,no icterus     Data Reviewed: I have personally reviewed following labs and imaging studies  CBC: Recent Labs  Lab 02/28/23 1340 02/28/23 1816  WBC  --  9.3  HGB 9.2* 8.1*  HCT 27.0* 27.4*  MCV  --  87.8  PLT  --  216   Basic Metabolic Panel: Recent Labs  Lab 02/28/23 1340 02/28/23 1816  NA 141 138  K 3.8 3.9  CL 105 106  CO2  --  22  GLUCOSE 99 123*  BUN 11 14  CREATININE 1.00 1.12  CALCIUM  --  7.9*     No results found for this or any previous visit (from the past 240 hour(s)).   Radiology Studies: CT Angio Chest PE W and/or Wo Contrast  Result Date: 02/28/2023 CLINICAL DATA:  Seizure-like activity EXAM: CT ANGIOGRAPHY CHEST WITH CONTRAST TECHNIQUE: Multidetector CT imaging of the chest was performed using the standard protocol during bolus administration of intravenous contrast. Multiplanar CT image reconstructions and MIPs were obtained to evaluate the vascular anatomy. RADIATION DOSE REDUCTION: This exam was performed according to the departmental dose-optimization program which includes automated exposure control, adjustment of the mA and/or kV according to patient size and/or use of iterative reconstruction technique. CONTRAST:  75mL OMNIPAQUE IOHEXOL 350 MG/ML SOLN COMPARISON:  Chest x-ray from earlier in the same day, CT from 08/28/2022. FINDINGS: Cardiovascular: Atherosclerotic calcifications of the thoracic aorta are noted. Dilatation of the ascending aorta is seen to 4.6 cm in diameter. This is roughly stable from the prior exam. Normal tapering is noted in the distal thoracic aortic arch. Descending thoracic aorta shows no aneurysmal dilatation. Pulmonary artery shows a normal branching pattern. No intraluminal filling defect is identified to suggest pulmonary embolism. Heavy coronary calcifications are noted. Cardiac  enlargement is seen Mediastinum/Nodes: Thoracic inlet is within normal limits. No hilar or mediastinal adenopathy is noted. The esophagus as visualized is within normal limits. Lungs/Pleura: Lungs are well aerated bilaterally. No focal infiltrate is seen. Tiny left effusion is noted new from the prior exam. Upper Abdomen: Visualized upper abdomen shows atrophy of the left kidney with associated renal cystic change stable from the prior exam. No acute abnormality is noted. Musculoskeletal: Degenerative changes of the thoracic spine are noted. No acute rib abnormality is noted. Review of the MIP images confirms the above findings. IMPRESSION: No evidence of pulmonary embolism. Stable dilatation of the ascending aorta to 4.6 cm. Ascending thoracic aortic aneurysm. Recommend semi-annual imaging followup by CTA or MRA and referral to cardiothoracic surgery if not already obtained. This recommendation follows 2010 ACCF/AHA/AATS/ACR/ASA/SCA/SCAI/SIR/STS/SVM Guidelines for the Diagnosis and Management of Patients With Thoracic Aortic Disease. Circulation. 2010; 121: Z610-R604. Aortic aneurysm NOS (ICD10-I71.9) Tiny left pleural effusion is noted. No other focal abnormality is seen. 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 Electronically Signed   By: Alcide Clever M.D.   On: 02/28/2023 22:35   CT Head Wo Contrast  Result Date: 02/28/2023 CLINICAL DATA:  New onset seizure EXAM: CT HEAD WITHOUT CONTRAST TECHNIQUE: Contiguous  axial images were obtained from the base of the skull through the vertex without intravenous contrast. RADIATION DOSE REDUCTION: This exam was performed according to the departmental dose-optimization program which includes automated exposure control, adjustment of the mA and/or kV according to patient size and/or use of iterative reconstruction technique. COMPARISON:  None Available. FINDINGS: Brain: There is no acute intracranial hemorrhage, extra-axial fluid collection, or acute infarct. Parenchymal  volume is normal. The ventricles are normal in size. Gray-white differentiation is preserved. The pituitary and suprasellar region are normal. There is no mass lesion. There is no mass effect or midline shift. Vascular: There is calcification of the bilateral carotid siphons. Skull: Normal. Negative for fracture or focal lesion. Sinuses/Orbits: The paranasal sinuses are clear. The globes and orbits are unremarkable. Other: The mastoid air cells and middle ear cavities are clear. IMPRESSION: No acute intracranial pathology. Electronically Signed   By: Lesia Hausen M.D.   On: 02/28/2023 19:19   DG Chest 2 View  Result Date: 02/28/2023 CLINICAL DATA:  Seizure-like activity EXAM: CHEST - 2 VIEW COMPARISON:  Chest radiograph 01/25/2023 FINDINGS: The patient is rotated to the left. The cardiomediastinal silhouette is stable. There is no focal consolidation or pulmonary edema. There is no pleural effusion or pneumothorax There is no acute osseous abnormality. Thoracic dextrocurvature is unchanged. IMPRESSION: Stable chest with no radiographic evidence of acute cardiopulmonary process. Electronically Signed   By: Lesia Hausen M.D.   On: 02/28/2023 19:13    Scheduled Meds:  sodium chloride   Intravenous Once   apixaban  5 mg Oral BID   atorvastatin  20 mg Oral Daily   carvedilol  3.125 mg Oral BID WC   mouth rinse  15 mL Mouth Rinse Q2H   Continuous Infusions:  sodium chloride 75 mL/hr (03/01/23 0437)     LOS: 1 day   Burnadette Pop, MD [facility] P8/06/2023, 8:06 AM

## 2023-03-01 NOTE — Assessment & Plan Note (Signed)
Rate controlled at 68 Continue carvedilol and amiodarone as well as Eliquis

## 2023-03-01 NOTE — ED Notes (Signed)
Pt ambulated to restroom with walker to self cath. Verbal order per Dr. Renford Dills

## 2023-03-01 NOTE — Assessment & Plan Note (Signed)
O2 sat was 70 with EMS, the patient appeared comfortable Will continue oxygen to maintain sats above 94 CTA chest was negative for PE or other acute abnormality

## 2023-03-01 NOTE — Consult Note (Addendum)
NEURO HOSPITALIST CONSULT NOTE   Requesting physician: Dr. Para March  Reason for Consult: Transient episode of awake unresponsive state with LUE shaking and fecal incontinence.   History obtained from:   Patient and Chart     HPI:                                                                                                                                          [name] is an 82 y.o. male with a history of A-fib (on Eliquis), CAD, chronic diastolic CHF and HTN who presented to the ED via EMS from home for evaluation of a foot injury.  Patient reported that on Sunday morning he was getting out of his shower and walking barefoot on his tile floor when he felt something dripping and when he looked down he noticed he was bleeding from his right foot.  He did not endorse any significant pain but states bleeding persisted in the context of his being on blood thinner medication.  EMS was called and patient was brought to the ED.  He denied any lightheadedness, dizziness, weakness or numbness. On initial exam in the ED, the patient had a very small lac to the inside of his right foot at arch. He denied any pain but did endorse feeling dizzy.   He was discharged home, but returned to the ED later on Sunday after having seizure-like activity. Per Triage RN note: "Pt bib ems from home c/o seizure like activity. Pt was sitting at his kitchen table when his daughter noticed pt arm shaking and u/t respond to her questions. Pt came to and started to walk but started to shake and u/t respond again. Daughter stated this happened for about a minute. Pt didn't hit his head. Pt u/t recall events. Pt was recently d/c for a cut on his right foot. Hx Afib. Ems noted initial BP 100/60 received 500 cc NS final BP 139/80 Pt HR initially 130 and final rate 70 before fluids RA 100%"  At the time of Neurology evaluation, the patient does not recall having had any seizure-like activity. He also does  not remember being in the awake, unresponsive state described by his daughter.   Past Medical History:  Diagnosis Date   Ascending aorta dilatation (HCC)    44mm by chest CT angio 04/2018   CAD (coronary artery disease)    a. 05/2006 Abnl stress test w/ 2-71mm ST dep; b. 05/2006 Cath/PCI: LM nl, LAD 30-40ost, 20-30p, 90d (small), D1 nl, D2 nl, LCX nl, OM1/2 nl, RCA Ca2+, 72m/d (3.5x16 Liberty BMS & 3.5x12 Liberty BMS), EF 50%.   Chronic diastolic CHF (congestive heart failure) (HCC)    a. 10/2017 Echo: EF 50-55%, mild AI, sev dil LA, mod dil RA.   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 CHA2DS2VASc 5-->Eliquis; b. Prev WCT on flecainide;  c. 12/2017 s/p DCCV.   Pneumothorax    Prolonged Q-T interval on ECG 06/22/2022   Ventricular tachycardia (HCC)    a. possibly proarrhythmia from flecainide.    Past Surgical History:  Procedure Laterality Date   CARDIOVERSION N/A 12/23/2017   Procedure: CARDIOVERSION;  Surgeon: Quintella Reichert, MD;  Location: [facility] ENDOSCOPY;  Service: Cardiovascular;  Laterality: N/A;   CARDIOVERSION N/A 03/08/2018   Procedure: CARDIOVERSION;  Surgeon: Quintella Reichert, MD;  Location: [facility] ENDOSCOPY;  Service: Cardiovascular;  Laterality: N/A;   INGUINAL HERNIA REPAIR     x2   left total hip surgery     TEE WITHOUT CARDIOVERSION N/A 03/08/2018   Procedure: TRANSESOPHAGEAL ECHOCARDIOGRAM (TEE);  Surgeon: Quintella Reichert, MD;  Location: [name] ENDOSCOPY;  Service: Cardiovascular;  Laterality: N/A;   TOTAL HIP ARTHROPLASTY Right 08/19/2020   Procedure: TOTAL HIP ARTHROPLASTY ANTERIOR APPROACH;  Surgeon: Ollen Gross, MD;  Location: [facility] ORS;  Service: Orthopedics;  Laterality: Right;    TOTAL KNEE ARTHROPLASTY Left 02/28/2018   Procedure: LEFT TOTAL KNEE ARTHROPLASTY;  Surgeon: Ollen Gross, MD;  Location: [facility] ORS;  Service: Orthopedics;  Laterality: Left;   VEIN  LIGATION AND STRIPPING Left 04/08/2020   Procedure: LEFT LOWER EXTREMITY VEIN LIGATION AND EXCISION OF ULCER;  Surgeon: Chuck Hint, MD;  Location: [facility] OR;  Service: Vascular;  Laterality: Left;    Family History  Problem Relation Age of Onset   Coronary artery disease Other              Social History:  reports that he has never smoked. He has never used smokeless tobacco. He reports that he does not currently use alcohol. He reports that he does not currently use drugs after having used the following drugs: Other-see comments.  No Known Allergies  MEDICATIONS:                                                                                                                     No current facility-administered medications on file prior to encounter.   Current Outpatient Medications on File Prior to Encounter  Medication Sig Dispense Refill   acetaminophen (TYLENOL) 325 MG tablet Take 2 tablets (650 mg total) by mouth every 6 (six) hours as needed for moderate pain. 30 tablet 0   amiodarone (PACERONE) 200 MG tablet TAKE 1 TABLET(200 MG) BY MOUTH DAILY 90 tablet 1   apixaban (ELIQUIS) 5 MG TABS tablet Take 1 tablet (5 mg total) by mouth 2 (two) times daily. 180 tablet 1   ascorbic acid (VITAMIN C) 500 MG tablet Take 500 mg by mouth daily.     atorvastatin (LIPITOR) 20 MG tablet Take 20 mg by mouth daily.     atorvastatin (LIPITOR) 20 MG tablet TAKE 1 TABLET(20 MG) BY MOUTH DAILY 90 tablet 0  Boswellia Serrata (BOSWELLIA PO) Take 1,200 mg by mouth daily.     carvedilol (COREG) 3.125 MG tablet Take by mouth.     carvedilol (COREG) 3.125 MG tablet Take by mouth.     cephALEXin (KEFLEX) 500 MG capsule Take 1 capsule (500 mg total) by mouth 3 (three) times daily. 21 capsule 0   clofazimine 50 mg CAPS capsule (for compassionate use) Take 2 capsules (100 mg total) by mouth daily with breakfast. 100 capsule 1   doxycycline (VIBRAMYCIN) 100 MG capsule Take 1 capsule (100 mg total) by mouth  2 (two) times daily. 14 capsule 0   ELIQUIS 5 MG TABS tablet Take 5 mg by mouth 2 (two) times daily.     furosemide (LASIX) 20 MG tablet Take 1 tablet (20 mg total) by mouth every other day. 90 tablet 3   lidocaine (LIDODERM) 5 % Place 1 patch onto the skin daily.     Misc Natural Products (GLUCOSAMINE CHONDROITIN TRIPLE) TABS Take 1 tablet by mouth daily.     Multiple Vitamin (MULTIVITAMIN WITH MINERALS) TABS tablet Take 1 tablet by mouth daily.     Omadacycline Tosylate 150 MG TABS Take 2 tablets (300 mg total) by mouth daily. 30 tablet    Omega-3 Fatty Acids (FISH OIL) 1200 MG CAPS Take 1,200 mg by mouth daily.     PRESCRIPTION MEDICATION Take 2 tablets by mouth daily.      Scheduled:  sodium chloride   Intravenous Once   amiodarone  200 mg Oral Daily   apixaban  5 mg Oral BID   atorvastatin  20 mg Oral Daily   carvedilol  3.125 mg Oral BID WC   furosemide  20 mg Oral QODAY   mouth rinse  15 mL Mouth Rinse Q2H   Continuous:  sodium chloride 75 mL/hr (03/01/23 0437)     ROS:                                                                                                                                       As per HPI. The patient does not endorse any additional symptoms at this time.    Blood pressure (!) 158/80, pulse 66, temperature 97.8 F (36.6 C), temperature source Oral, resp. rate 11, SpO2 100%.   General Examination:                                                                                                       Physical Exam  HEENT-  [place]/AT    Lungs- Respirations unlabored Extremities- No edema  Neurological Examination Mental Status: Alert, oriented x 5, thought content appropriate.  Speech fluent without evidence of aphasia.  Able to follow all commands without difficulty. Does exhibit short-term memory deficits for recent events in the ED.  Cranial Nerves: II: Temporal visual fields intact with no extinction to DSS. PERRL  III,IV, VI: No ptosis. EOMI.   V: Temp sensation equal bilaterally  VII: Smile symmetric VIII: Hearing intact to voice IX,X: No hoarseness XI: Symmetric shoulder shrug XII: Midline tongue extension Motor: BUE 5/5 proximally and distally BLE 5/5 proximally and distally  No pronator drift.  Sensory: Temp and light touch intact throughout, bilaterally. No extinction to DSS.  Deep Tendon Reflexes: 2+ and symmetric throughout Cerebellar: No ataxia with FNF bilaterally  Gait: Normal gait and station in the context of his scoliosis.      Lab Results: Basic Metabolic Panel: Recent Labs  Lab 02/28/23 1340 02/28/23 1816  NA 141 138  K 3.8 3.9  CL 105 106  CO2  --  22  GLUCOSE 99 123*  BUN 11 14  CREATININE 1.00 1.12  CALCIUM  --  7.9*    CBC: Recent Labs  Lab 02/28/23 1340 02/28/23 1816  WBC  --  9.3  HGB 9.2* 8.1*  HCT 27.0* 27.4*  MCV  --  87.8  PLT  --  216    Cardiac Enzymes: No results for input(s): "CKTOTAL", "CKMB", "CKMBINDEX", "TROPONINI" in the last 168 hours.  Lipid Panel: No results for input(s): "CHOL", "TRIG", "HDL", "CHOLHDL", "VLDL", "LDLCALC" in the last 168 hours.  Imaging: CT Angio Chest PE W and/or Wo Contrast  Result Date: 02/28/2023 CLINICAL DATA:  Seizure-like activity EXAM: CT ANGIOGRAPHY CHEST WITH CONTRAST TECHNIQUE: Multidetector CT imaging of the chest was performed using the standard protocol during bolus administration of intravenous contrast. Multiplanar CT image reconstructions and MIPs were obtained to evaluate the vascular anatomy. RADIATION DOSE REDUCTION: This exam was performed according to the departmental dose-optimization program which includes automated exposure control, adjustment of the mA and/or kV according to patient size and/or use of iterative reconstruction technique. CONTRAST:  75mL OMNIPAQUE IOHEXOL 350 MG/ML SOLN COMPARISON:  Chest x-ray from earlier in the same day, CT from 08/28/2022. FINDINGS: Cardiovascular: Atherosclerotic calcifications of the  thoracic aorta are noted. Dilatation of the ascending aorta is seen to 4.6 cm in diameter. This is roughly stable from the prior exam. Normal tapering is noted in the distal thoracic aortic arch. Descending thoracic aorta shows no aneurysmal dilatation. Pulmonary artery shows a normal branching pattern. No intraluminal filling defect is identified to suggest pulmonary embolism. Heavy coronary calcifications are noted. Cardiac enlargement is seen Mediastinum/Nodes: Thoracic inlet is within normal limits. No hilar or mediastinal adenopathy is noted. The esophagus as visualized is within normal limits. Lungs/Pleura: Lungs are well aerated bilaterally. No focal infiltrate is seen. Tiny left effusion is noted new from the prior exam. Upper Abdomen: Visualized upper abdomen shows atrophy of the left kidney with associated renal cystic change stable from the prior exam. No acute abnormality is noted. Musculoskeletal: Degenerative changes of the thoracic spine are noted. No acute rib abnormality is noted. Review of the MIP images confirms the above findings. IMPRESSION: No evidence of pulmonary embolism. Stable dilatation of the ascending aorta to 4.6 cm. Ascending thoracic aortic aneurysm. Recommend semi-annual imaging followup by CTA or MRA and referral to cardiothoracic surgery if not already obtained. This recommendation follows 2010 ACCF/AHA/AATS/ACR/ASA/SCA/SCAI/SIR/STS/SVM Guidelines for the  Diagnosis and Management of Patients With Thoracic Aortic Disease. Circulation. 2010; 121: W098-J191. Aortic aneurysm NOS (ICD10-I71.9) Tiny left pleural effusion is noted. No other focal abnormality is seen. 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 Electronically Signed   By: Alcide Clever M.D.   On: 02/28/2023 22:35   CT Head Wo Contrast  Result Date: 02/28/2023 CLINICAL DATA:  New onset seizure EXAM: CT HEAD WITHOUT CONTRAST TECHNIQUE: Contiguous axial images were obtained from the base of the skull through the vertex without  intravenous contrast. RADIATION DOSE REDUCTION: This exam was performed according to the departmental dose-optimization program which includes automated exposure control, adjustment of the mA and/or kV according to patient size and/or use of iterative reconstruction technique. COMPARISON:  None Available. FINDINGS: Brain: There is no acute intracranial hemorrhage, extra-axial fluid collection, or acute infarct. Parenchymal volume is normal. The ventricles are normal in size. Gray-white differentiation is preserved. The pituitary and suprasellar region are normal. There is no mass lesion. There is no mass effect or midline shift. Vascular: There is calcification of the bilateral carotid siphons. Skull: Normal. Negative for fracture or focal lesion. Sinuses/Orbits: The paranasal sinuses are clear. The globes and orbits are unremarkable. Other: The mastoid air cells and middle ear cavities are clear. IMPRESSION: No acute intracranial pathology. Electronically Signed   By: Lesia Hausen M.D.   On: 02/28/2023 19:19   DG Chest 2 View  Result Date: 02/28/2023 CLINICAL DATA:  Seizure-like activity EXAM: CHEST - 2 VIEW COMPARISON:  Chest radiograph 01/25/2023 FINDINGS: The patient is rotated to the left. The cardiomediastinal silhouette is stable. There is no focal consolidation or pulmonary edema. There is no pleural effusion or pneumothorax There is no acute osseous abnormality. Thoracic dextrocurvature is unchanged. IMPRESSION: Stable chest with no radiographic evidence of acute cardiopulmonary process. Electronically Signed   By: Lesia Hausen M.D.   On: 02/28/2023 19:13     Assessment: 82 year old male with a history of PAF presenting after an approximately 1 minute duration period of awake unresponsiveness with arm shaking at home, during which he was able to maintain a standing position - Exam reveals short term memory deficit. No focal weakness noted. Has clearly apparent scoliosis. No jerking, twitching or  other seizure-like activity appreciated.  - CT head: No acute intracranial pathology.  - DDx: The spell described is suggestive of possible tremoring and AMS due to transient hypotension in the setting of recent foot laceration with bleeding, versus new-onset partial-complex seizure activity   Recommendations: - MRI brain - EEG in AM.  - Orthostatics.  - Inpatient seizure precautions - Outpatient seizure precautions: Per [facility] statutes, patients with seizures are not allowed to drive until  they have been seizure-free for six months. Use caution when using heavy equipment or power tools. Avoid working on ladders or at heights. Take showers instead of baths. Ensure the water temperature is not too high on the home water heater. Do not go swimming alone. When caring for infants or small children, sit down when holding, feeding, or changing them to minimize risk of injury to the child in the event you have a seizure. Also, Maintain good sleep hygiene. Avoid alcohol.    Electronically signed: Dr. Caryl Pina 03/01/2023, 2:46 AM

## 2023-03-01 NOTE — Procedures (Signed)
Patient Name: [name]  MRN: [id number]  Epilepsy Attending: Charlsie Quest  Referring Physician/Provider: Andris Baumann, MD  Date: 03/01/2023 Duration: 22.50 mins  Patient history: 82 year old male with a history of PAF presenting after an approximately 1 minute duration period of awake unresponsiveness with arm shaking at home, during which he was able to maintain a standing position. EEG to evaluate for seizure  Level of alertness: Awake, asleep  AEDs during EEG study: Ativan  Technical aspects: This EEG study was done with scalp electrodes positioned according to the 10-20 International system of electrode placement. Electrical activity was reviewed with band pass filter of 1-70Hz , sensitivity of 7 uV/mm, display speed of 33mm/sec with a 60Hz  notched filter applied as appropriate. EEG data were recorded continuously and digitally stored.  Video monitoring was available and reviewed as appropriate.  Description: No clear posterior dominant rhythm was seen. Sleep was characterized by vertex waves, sleep spindles (12 to 14 Hz), maximal frontocentral region.  There is an excessive amount of 15 to 18 Hz beta activity distributed symmetrically and diffusely.  Hyperventilation and photic stimulation were not performed.     Of note, parts of study were difficult due to significant myogenic artifact.   ABNORMALITY - Excessive beta, generalized  IMPRESSION: This study is within normal limits. The excessive beta activity seen in the background is most likely due to the effect of benzodiazepine and is a benign EEG pattern. No seizures or epileptiform discharges were seen throughout the recording.  A normal interictal EEG does not exclude the diagnosis of epilepsy.  Huong Luthi Annabelle Harman

## 2023-03-01 NOTE — Assessment & Plan Note (Signed)
BP stable Continue carvedilol

## 2023-03-01 NOTE — Assessment & Plan Note (Addendum)
Laceration right foot 02/28/2023 Patient had a laceration medial aspect right foot with bleeding, hemostasis was achieved Hemoglobin 8.1, down from 9.2 a few hours prior Continue to monitor Ongoing bleeding not suspected so will continue Eliquis

## 2023-03-01 NOTE — Assessment & Plan Note (Signed)
Clinically euvolemic Continue carvedilol and furosemide

## 2023-03-01 NOTE — Care Management Important Message (Signed)
Important Message  Patient Details  Name: [name] MRN: [id number] Date of Birth: 17-Oct-1940   Medicare Important Message Given:   yes     Michel Bickers, RN 03/01/2023, 3:27 PM

## 2023-03-01 NOTE — ED Notes (Signed)
Due to pt being confused and unable to make medical decisions, this RN and alex RN completed blood consent form.

## 2023-03-01 NOTE — ED Notes (Signed)
Pt sitting on bed side eating.

## 2023-03-01 NOTE — Assessment & Plan Note (Addendum)
Possible acute metabolic encephalopathy/acute confusion Patient with seizure-like activity and confusion in the setting of blood loss anemia from a laceration sustained earlier in the day Patient noted to be confused on exam in the ED Neuro was consulted from the ED, Dr. Otelia Limes Will get EEG, continuous cardiac monitoring and echocardiogram Seizure precautions Ativan as needed seizure Neurology consult to follow Delirium precautions

## 2023-03-01 NOTE — Assessment & Plan Note (Signed)
CTA showing stable aneurysm at 4.6 cm Last seen by cardiothoracic surgery on 08/2022 to follow-up in 1 year

## 2023-03-02 DIAGNOSIS — R569 Unspecified convulsions: Secondary | ICD-10-CM | POA: Diagnosis not present

## 2023-03-02 LAB — CBC WITH DIFFERENTIAL/PLATELET
Abs Immature Granulocytes: 0.02 10*3/uL (ref 0.00–0.07)
Basophils Absolute: 0 10*3/uL (ref 0.0–0.1)
Basophils Relative: 0 %
Eosinophils Absolute: 0.1 10*3/uL (ref 0.0–0.5)
Eosinophils Relative: 2 %
HCT: 25.7 % — ABNORMAL LOW (ref 39.0–52.0)
Hemoglobin: 8.2 g/dL — ABNORMAL LOW (ref 13.0–17.0)
Immature Granulocytes: 0 %
Lymphocytes Relative: 15 %
Lymphs Abs: 0.8 10*3/uL (ref 0.7–4.0)
MCH: 27 pg (ref 26.0–34.0)
MCHC: 31.9 g/dL (ref 30.0–36.0)
MCV: 84.5 fL (ref 80.0–100.0)
Monocytes Absolute: 0.5 10*3/uL (ref 0.1–1.0)
Monocytes Relative: 9 %
Neutro Abs: 3.7 10*3/uL (ref 1.7–7.7)
Neutrophils Relative %: 74 %
Platelets: 188 10*3/uL (ref 150–400)
RBC: 3.04 MIL/uL — ABNORMAL LOW (ref 4.22–5.81)
RDW: 15.8 % — ABNORMAL HIGH (ref 11.5–15.5)
WBC: 5 10*3/uL (ref 4.0–10.5)
nRBC: 0 % (ref 0.0–0.2)

## 2023-03-02 LAB — BASIC METABOLIC PANEL
Anion gap: 9 (ref 5–15)
BUN: 12 mg/dL (ref 8–23)
CO2: 25 mmol/L (ref 22–32)
Calcium: 7.8 mg/dL — ABNORMAL LOW (ref 8.9–10.3)
Chloride: 106 mmol/L (ref 98–111)
Creatinine, Ser: 1.04 mg/dL (ref 0.61–1.24)
GFR, Estimated: >60 mL/min (ref >60)
Glucose, Bld: 93 mg/dL (ref 70–99)
Potassium: 3.7 mmol/L (ref 3.5–5.1)
Sodium: 140 mmol/L (ref 135–145)

## 2023-03-02 LAB — BRAIN NATRIURETIC PEPTIDE: B Natriuretic Peptide: 514.5 pg/mL — ABNORMAL HIGH (ref 0.0–100.0)

## 2023-03-02 LAB — MAGNESIUM: Magnesium: 1.8 mg/dL (ref 1.7–2.4)

## 2023-03-02 MED ORDER — VITAMIN C 500 MG PO TABS: 500.0000 mg | ORAL_TABLET | Freq: Every day | ORAL | Status: DC

## 2023-03-02 MED ORDER — APIXABAN 2.5 MG PO TABS
2.5000 mg | ORAL_TABLET | Freq: Two times a day (BID) | ORAL | Status: DC
  Administered 2023-03-02 – 2023-03-03 (×3): 2.5 mg via ORAL
  Filled 2023-03-02 (×3): qty 1

## 2023-03-02 MED ORDER — ACETAMINOPHEN 325 MG PO TABS: 650.0000 mg | ORAL_TABLET | Freq: Four times a day (QID) | ORAL | Status: DC | PRN

## 2023-03-02 MED ORDER — ADULT MULTIVITAMIN W/MINERALS CH
1.0000 | ORAL_TABLET | Freq: Every day | ORAL | Status: DC
  Administered 2023-03-02 – 2023-03-03 (×2): 1 via ORAL
  Filled 2023-03-02 (×2): qty 1

## 2023-03-02 MED ORDER — OMEGA-3-ACID ETHYL ESTERS 1 G PO CAPS
1.0000 | ORAL_CAPSULE | Freq: Every day | ORAL | Status: DC
  Administered 2023-03-02 – 2023-03-03 (×2): 1 g via ORAL
  Filled 2023-03-02 (×2): qty 1

## 2023-03-02 NOTE — Evaluation (Signed)
Clinical/Bedside Swallow Evaluation Patient Details  Name: [name] MRN: [id number] Date of Birth: [birth year]  Today's Date: 03/02/2023 Time: SLP Start Time (ACUTE ONLY): 4098 SLP Stop Time (ACUTE ONLY): 0930 SLP Time Calculation (min) (ACUTE ONLY): 14 min  Past Medical History:  Past Medical History:  Diagnosis Date   Ascending aorta dilatation (HCC)    44mm by chest CT angio 04/2018   CAD (coronary artery disease)    a. 05/2006 Abnl stress test w/ 2-42mm ST dep; b. 05/2006 Cath/PCI: LM nl, LAD 30-40ost, 20-30p, 90d (small), D1 nl, D2 nl, LCX nl, OM1/2 nl, RCA Ca2+, 37m/d (3.5x16 Liberty BMS & 3.5x12 Liberty BMS), EF 50%.   Chronic diastolic CHF (congestive heart failure) (HCC)    a. 10/2017 Echo: EF 50-55%, mild AI, sev dil LA, mod dil RA.   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 CHA2DS2VASc 5-->Eliquis; b. Prev WCT on flecainide;  c. 12/2017 s/p DCCV.   Pneumothorax    Prolonged Q-T interval on ECG 06/22/2022   Ventricular tachycardia (HCC)    a. possibly proarrhythmia from flecainide.   Past Surgical History:  Past Surgical History:  Procedure Laterality Date   CARDIOVERSION N/A 12/23/2017   Procedure: CARDIOVERSION;  Surgeon: Quintella Reichert, MD;  Location: [facility] ENDOSCOPY;  Service: Cardiovascular;  Laterality: N/A;   CARDIOVERSION N/A 03/08/2018   Procedure: CARDIOVERSION;  Surgeon: Quintella Reichert, MD;  Location: [facility] ENDOSCOPY;  Service: Cardiovascular;  Laterality: N/A;   INGUINAL HERNIA REPAIR     x2   left total hip surgery     TEE WITHOUT CARDIOVERSION N/A 03/08/2018   Procedure: TRANSESOPHAGEAL ECHOCARDIOGRAM (TEE);  Surgeon: Quintella Reichert, MD;  Location: [facility] ENDOSCOPY;  Service: Cardiovascular;  Laterality: N/A;   TOTAL HIP ARTHROPLASTY Right 08/19/2020   Procedure: TOTAL HIP ARTHROPLASTY ANTERIOR APPROACH;  Surgeon: Ollen Gross, MD;  Location: [facility] ORS;   Service: Orthopedics;  Laterality: Right;    TOTAL KNEE ARTHROPLASTY Left 02/28/2018   Procedure: LEFT TOTAL KNEE ARTHROPLASTY;  Surgeon: Ollen Gross, MD;  Location: [facility] ORS;  Service: Orthopedics;  Laterality: Left;   VEIN LIGATION AND STRIPPING Left 04/08/2020   Procedure: LEFT LOWER EXTREMITY VEIN LIGATION AND EXCISION OF ULCER;  Surgeon: Chuck Hint, MD;  Location: [facility] OR;  Service: Vascular;  Laterality: Left;   HPI:  Patient is a 42 old male with history of hypertension, permanent A-fib on Eliquis, chronic venous insufficiency, coronary artery disease status post stenting, chronic diastolic CHF, 4.5 cm ascending aortic aneurysm following -up with cardiothoracic surgery who presented to the emergency department with seizure-like activity, fecal incontinence.  Also recently had a cut on his foot with significant blood loss.  Report of shaking of arms that lasted about 20 seconds.  Patient was confused after the incident.  CT head did not show any acute intracranial findings.  Neurology consulted for the concern of seizure. MRI negative    Assessment / Plan / Recommendation  Clinical Impression  Pt demonstrates no signs of aspiration or dysphagia. He tolerates regular solids and thin liquids at baseline and currently shows no acute change. Pt noted to have a bit of wet vocal quality in conversation, which causes intermittent throat clearing. This was not associated with PO intake, likely related to occasional pooling of saliva in oropharynx. Will sign off. SLP Visit Diagnosis: Dysphagia, unspecified (R13.10)    Aspiration Risk  No limitations  Diet Recommendation Regular;Thin liquid    Liquid Administration via: Cup;Straw Medication Administration: Whole meds with liquid Supervision: Patient able to self feed Postural Changes: Seated upright at 90 degrees    Other  Recommendations      Recommendations for follow up therapy are one component of a multi-disciplinary  discharge planning process, led by the attending physician.  Recommendations may be updated based on patient status, additional functional criteria and insurance authorization.  Follow up Recommendations        Assistance Recommended at Discharge    Functional Status Assessment    Frequency and Duration            Prognosis        Swallow Study   General HPI: Patient is a 34 old male with history of hypertension, permanent A-fib on Eliquis, chronic venous insufficiency, coronary artery disease status post stenting, chronic diastolic CHF, 4.5 cm ascending aortic aneurysm following -up with cardiothoracic surgery who presented to the emergency department with seizure-like activity, fecal incontinence.  Also recently had a cut on his foot with significant blood loss.  Report of shaking of arms that lasted about 20 seconds.  Patient was confused after the incident.  CT head did not show any acute intracranial findings.  Neurology consulted for the concern of seizure. MRI negative Type of Study: Bedside Swallow Evaluation Previous Swallow Assessment: none Diet Prior to this Study: Regular;Thin liquids (Level 0) Temperature Spikes Noted: No Respiratory Status: Room air History of Recent Intubation: No Behavior/Cognition: Alert;Cooperative;Pleasant mood Oral Cavity Assessment: Within Functional Limits Oral Care Completed by SLP: No Oral Cavity - Dentition: Adequate natural dentition Vision: Functional for self-feeding Self-Feeding Abilities: Able to feed self Patient Positioning: Upright in chair Baseline Vocal Quality: Normal;Wet Volitional Cough: Strong Volitional Swallow: Able to elicit    Oral/Motor/Sensory Function Overall Oral Motor/Sensory Function: Within functional limits   Ice Chips     Thin Liquid Thin Liquid: Within functional limits Presentation: Straw    Nectar Thick Nectar Thick Liquid: Not tested   Honey Thick Honey Thick Liquid: Not tested   Puree Puree: Within  functional limits   Solid     Solid: Within functional limits     Harlon Ditty, MA CCC-SLP  [facility] Secure Chat Preferred Office [phone]  Claudine Mouton 03/02/2023,9:56 AM

## 2023-03-02 NOTE — Plan of Care (Signed)

## 2023-03-02 NOTE — Discharge Summary (Addendum)
[name] ZOX:[id number] DOB: November 10, 1940 DOA: 02/28/2023  PCP: Renford Dills, MD  Admit date: 02/28/2023  Discharge date: 03/02/2023  Admitted From: Home   Disposition:  Home   Recommendations for Outpatient Follow-up:   Follow up with PCP in 1-2 weeks  PCP Please obtain BMP/CBC, 2 view CXR in 1week,  (see Discharge instructions)   PCP Please follow up on the following pending results:    Home Health: PT, RN, Aide, Sw if qulaifies   Equipment/Devices: Environmental consultant, shower seat Consultations: Neuro Discharge Condition: Stable    CODE STATUS: Full    Diet Recommendation: Heart Healthy     Chief Complaint  Patient presents with   Seizures     Brief history of present illness from the day of admission and additional interim summary    82 y.o. 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  Patient was seen in the ED earlier in the day for cut on his foot with significant blood loss.  He was treated and discharged but on his arrival home felt lightheaded.  While sitting at his home computer he slumped forward without loss of consciousness and started having shaking of the that arm that lasted about 20 seconds.  It resolved and then patient got up to walk to the bathroom and felt weak, having to hold onto the walls for support, had an episode of fecal incontinence and his arm again started shaking.  The episode was followed by up to 15 minutes of  confusion.  He came to the ER where he was seen by neurologist and admitted to the hospital for further workup.                                                                 Hospital Course   Witnessed seizure-like activity [facility]) Possible acute metabolic encephalopathy/acute confusion Patient with seizure-like activity and confusion in the setting of blood loss anemia from a laceration sustained earlier in the day Patient noted to be confused on exam in the ED, question if he had symptomatic anemia and hypotension due to recent bleeding versus true seizure.  MRI and EEG brain unremarkable, patient back to baseline, case discussed with neurologist Dr. Selina Cooley, no AEDs required, will request him to follow-up with PCP in a week and neurologist in 1 to 2 weeks.  If cleared then can resume his normal activity and driving.  For now seizure instructions provided as was told by neurologist.  Home PT and RN also ordered.   ABLA (  acute blood loss anemia), symptomatic Laceration right foot 02/28/2023 Patient had a laceration medial aspect right foot with bleeding during recent ER visit prior to admission, hemostasis was achieved Hemoglobin 8.2, stable, bleeding has resolved.  PCP to monitor CBC.  Continue wet-to-dry dressing every other day to the laceration site, follow-up with PCP, quested to keep wound site clean and dry at all times.   Acute respiratory failure with hypoxia (HCC) O2 sat was 70 with EMS, to I-S and flutter valve completely resolved, CTA chest was negative for PE and nonacute.   Thoracic aortic aneurysm (HCC) CTA showing stable aneurysm at 4.6 cm Last seen by cardiothoracic surgery on 08/2022 to follow-up in 1 year   Persistent atrial fibrillation (HCC) Rate controlled at 68 Continue carvedilol and amiodarone as well as Eliquis   (HFpEF) heart failure with preserved ejection fraction (HCC) Clinically euvolemic Continue carvedilol and furosemide upon discharge   Coronary artery  disease involving native coronary artery of native heart without angina pectoris No complaints of chest pain, EKG with no ischemic changes Continue apixaban, atorvastatin and carvedilol   Chronic venous insufficiency No swelling noted in lower extremities   Essential hypertension BP stable Continue carvedilol    Discharge diagnosis     Principal Problem:   Seizure-like activity (HCC) Active Problems:   Witnessed seizure-like activity (HCC)   ABLA (acute blood loss anemia), symptomatic   Acute respiratory failure with hypoxia (HCC)   Laceration of right foot   Essential hypertension   Chronic venous insufficiency   Coronary artery disease involving native coronary artery of native heart without angina pectoris   (HFpEF) heart failure with preserved ejection fraction (HCC)   Persistent atrial fibrillation (HCC)   Thoracic aortic aneurysm (HCC)    Discharge instructions    Discharge Instructions     Diet - low sodium heart healthy   Complete by: As directed    Discharge instructions   Complete by: As directed    Do not drive, operate heavy machinery, perform activities at heights, swimming or participation in water activities or provide baby sitting services until you have seen by Primary MD or a Neurologist and advised to do so again.  Keep your foot laceration site clean and dry at all times.  Wet-to-dry dressing every other day.  Follow with Primary MD Renford Dills, MD in 3 days   Get CBC, CMP  -  checked next visit with your primary MD    Activity: As tolerated with Full fall precautions use walker/cane & assistance as needed  Disposition Home    Diet: Heart Healthy   Special Instructions: If you have smoked or chewed Tobacco  in the last 2 yrs please stop smoking, stop any regular Alcohol  and or any Recreational drug use.  On your next visit with your primary care physician please Get Medicines reviewed and adjusted.  Please request your Prim.MD to go  over all Hospital Tests and Procedure/Radiological results at the follow up, please get all Hospital records sent to your Prim MD by signing hospital release before you go home.  If you experience worsening of your admission symptoms, develop shortness of breath, life threatening emergency, suicidal or homicidal thoughts you must seek medical attention immediately by calling 911 or calling your MD immediately  if symptoms less severe.  You Must read complete instructions/literature along with all the possible adverse reactions/side effects for all the Medicines you take and that have been prescribed to you. Take any new Medicines after you have  completely understood and accpet all the possible adverse reactions/side effects.   Increase activity slowly   Complete by: As directed        Discharge Medications   Allergies as of 03/02/2023   No Known Allergies      Medication List     STOP taking these medications    ascorbic acid 500 MG tablet Commonly known as: VITAMIN C   cephALEXin 500 MG capsule Commonly known as: KEFLEX   doxycycline 100 MG capsule Commonly known as: VIBRAMYCIN       TAKE these medications    acetaminophen 325 MG tablet Commonly known as: Tylenol Take 2 tablets (650 mg total) by mouth every 6 (six) hours as needed for moderate pain.   amiodarone 200 MG tablet Commonly known as: PACERONE TAKE 1 TABLET(200 MG) BY MOUTH DAILY   apixaban 5 MG Tabs tablet Commonly known as: Eliquis Take 1 tablet (5 mg total) by mouth 2 (two) times daily. What changed: how much to take   atorvastatin 20 MG tablet Commonly known as: LIPITOR Take 20 mg by mouth daily.   atorvastatin 20 MG tablet Commonly known as: LIPITOR TAKE 1 TABLET(20 MG) BY MOUTH DAILY   BOSWELLIA PO Take 1,200 mg by mouth daily.   carvedilol 3.125 MG tablet Commonly known as: COREG Take 3.125 mg by mouth daily.   clofazimine 50 mg Caps capsule (for compassionate use) Take 2 capsules  (100 mg total) by mouth daily with breakfast.   Fish Oil 1200 MG Caps Take 1,200 mg by mouth daily.   furosemide 20 MG tablet Commonly known as: LASIX Take 1 tablet (20 mg total) by mouth every other day.   Glucosamine Chondroitin Triple Tabs Take 1 tablet by mouth daily.   lidocaine 5 % Commonly known as: LIDODERM Place 1 patch onto the skin daily.   multivitamin with minerals Tabs tablet Take 1 tablet by mouth daily.   Omadacycline Tosylate 150 MG Tabs Take 2 tablets (300 mg total) by mouth daily.               Durable Medical Equipment  (From admission, onward)           Start     Ordered   03/02/23 1058  For home use only DME Walker rolling  Once       Comments: 5 wheel  Question Answer Comment  Walker: With 5 Inch Wheels   Patient needs a walker to treat with the following condition Weakness      03/02/23 1057   03/02/23 1054  For home use only DME Other see comment  Once       Comments: tub/shower seat with a back  Question:  Length of Need  Answer:  6 Months   03/02/23 1054             Follow-up Information     Renford Dills, MD. Schedule an appointment as soon as possible for a visit in 3 day(s).   Specialty: Internal Medicine Why: Get your foot laceration site checked Contact information: [address] [place] [address] [phone]         [facility]. Schedule an appointment as soon as possible for a visit in 1 week(s).   Contact information: [address]     [address] [place] [address] [phone]                Major procedures and Radiology Reports - PLEASE review detailed  and final reports thoroughly  -      MR BRAIN W WO CONTRAST  Result Date: 03/01/2023 CLINICAL DATA:  Seizure, new-onset, no history of trauma. EXAM: MRI HEAD WITHOUT AND WITH CONTRAST TECHNIQUE: Multiplanar, multiecho pulse sequences of the brain and surrounding structures were  obtained without and with intravenous contrast. CONTRAST:  7mL GADAVIST GADOBUTROL 1 MMOL/ML IV SOLN COMPARISON:  Head CT 02/28/2023. FINDINGS: Brain: No acute infarct or hemorrhage. Mild chronic small-vessel disease. Generalized volume loss within expected range for patient age. No hydrocephalus or extra-axial collection. No foci of abnormal susceptibility. No mass or abnormal enhancement. Bilateral hippocampal atrophy. Symmetric size and signal. No evidence of cortical dysgenesis. Vascular: Normal flow voids and vessel enhancement. Skull and upper cervical spine: Normal marrow signal and enhancement. Sinuses/Orbits: No acute findings. Other: None. IMPRESSION: 1. No acute intracranial abnormality or mass. 2. Bilateral hippocampal atrophy. Electronically Signed   By: Orvan Falconer M.D.   On: 03/01/2023 20:02   EEG adult  Result Date: 03/01/2023 Charlsie Quest, MD     03/01/2023  1:32 PM Patient Name: ERDMAN ROHER MRN: [id number] Epilepsy Attending: Charlsie Quest Referring Physician/Provider: Andris Baumann, MD Date: 03/01/2023 Duration: 22.50 mins Patient history: 82 year old male with a history of PAF presenting after an approximately 1 minute duration period of awake unresponsiveness with arm shaking at home, during which he was able to maintain a standing position. EEG to evaluate for seizure Level of alertness: Awake, asleep AEDs during EEG study: Ativan Technical aspects: This EEG study was done with scalp electrodes positioned according to the 10-20 International system of electrode placement. Electrical activity was reviewed with band pass filter of 1-70Hz , sensitivity of 7 uV/mm, display speed of 43mm/sec with a 60Hz  notched filter applied as appropriate. EEG data were recorded continuously and digitally stored.  Video monitoring was available and reviewed as appropriate. Description: No clear posterior dominant rhythm was seen. Sleep was characterized by vertex waves, sleep spindles (12 to 14  Hz), maximal frontocentral region.  There is an excessive amount of 15 to 18 Hz beta activity distributed symmetrically and diffusely.  Hyperventilation and photic stimulation were not performed.   Of note, parts of study were difficult due to significant myogenic artifact. ABNORMALITY - Excessive beta, generalized IMPRESSION: This study is within normal limits. The excessive beta activity seen in the background is most likely due to the effect of benzodiazepine and is a benign EEG pattern. No seizures or epileptiform discharges were seen throughout the recording. A normal interictal EEG does not exclude the diagnosis of epilepsy. Charlsie Quest   CT Angio Chest PE W and/or Wo Contrast  Result Date: 02/28/2023 CLINICAL DATA:  Seizure-like activity EXAM: CT ANGIOGRAPHY CHEST WITH CONTRAST TECHNIQUE: Multidetector CT imaging of the chest was performed using the standard protocol during bolus administration of intravenous contrast. Multiplanar CT image reconstructions and MIPs were obtained to evaluate the vascular anatomy. RADIATION DOSE REDUCTION: This exam was performed according to the departmental dose-optimization program which includes automated exposure control, adjustment of the mA and/or kV according to patient size and/or use of iterative reconstruction technique. CONTRAST:  75mL OMNIPAQUE IOHEXOL 350 MG/ML SOLN COMPARISON:  Chest x-ray from earlier in the same day, CT from 08/28/2022. FINDINGS: Cardiovascular: Atherosclerotic calcifications of the thoracic aorta are noted. Dilatation of the ascending aorta is seen to 4.6 cm in diameter. This is roughly stable from the prior exam. Normal tapering is noted in the distal thoracic aortic arch. Descending thoracic aorta  shows no aneurysmal dilatation. Pulmonary artery shows a normal branching pattern. No intraluminal filling defect is identified to suggest pulmonary embolism. Heavy coronary calcifications are noted. Cardiac enlargement is seen  Mediastinum/Nodes: Thoracic inlet is within normal limits. No hilar or mediastinal adenopathy is noted. The esophagus as visualized is within normal limits. Lungs/Pleura: Lungs are well aerated bilaterally. No focal infiltrate is seen. Tiny left effusion is noted new from the prior exam. Upper Abdomen: Visualized upper abdomen shows atrophy of the left kidney with associated renal cystic change stable from the prior exam. No acute abnormality is noted. Musculoskeletal: Degenerative changes of the thoracic spine are noted. No acute rib abnormality is noted. Review of the MIP images confirms the above findings. IMPRESSION: No evidence of pulmonary embolism. Stable dilatation of the ascending aorta to 4.6 cm. Ascending thoracic aortic aneurysm. Recommend semi-annual imaging followup by CTA or MRA and referral to cardiothoracic surgery if not already obtained. This recommendation follows 2010 ACCF/AHA/AATS/ACR/ASA/SCA/SCAI/SIR/STS/SVM Guidelines for the Diagnosis and Management of Patients With Thoracic Aortic Disease. Circulation. 2010; 121: Z610-R604. Aortic aneurysm NOS (ICD10-I71.9) Tiny left pleural effusion is noted. No other focal abnormality is seen. 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 Electronically Signed   By: Alcide Clever M.D.   On: 02/28/2023 22:35   CT Head Wo Contrast  Result Date: 02/28/2023 CLINICAL DATA:  New onset seizure EXAM: CT HEAD WITHOUT CONTRAST TECHNIQUE: Contiguous axial images were obtained from the base of the skull through the vertex without intravenous contrast. RADIATION DOSE REDUCTION: This exam was performed according to the departmental dose-optimization program which includes automated exposure control, adjustment of the mA and/or kV according to patient size and/or use of iterative reconstruction technique. COMPARISON:  None Available. FINDINGS: Brain: There is no acute intracranial hemorrhage, extra-axial fluid collection, or acute infarct. Parenchymal volume is normal. The  ventricles are normal in size. Gray-white differentiation is preserved. The pituitary and suprasellar region are normal. There is no mass lesion. There is no mass effect or midline shift. Vascular: There is calcification of the bilateral carotid siphons. Skull: Normal. Negative for fracture or focal lesion. Sinuses/Orbits: The paranasal sinuses are clear. The globes and orbits are unremarkable. Other: The mastoid air cells and middle ear cavities are clear. IMPRESSION: No acute intracranial pathology. Electronically Signed   By: Lesia Hausen M.D.   On: 02/28/2023 19:19   DG Chest 2 View  Result Date: 02/28/2023 CLINICAL DATA:  Seizure-like activity EXAM: CHEST - 2 VIEW COMPARISON:  Chest radiograph 01/25/2023 FINDINGS: The patient is rotated to the left. The cardiomediastinal silhouette is stable. There is no focal consolidation or pulmonary edema. There is no pleural effusion or pneumothorax There is no acute osseous abnormality. Thoracic dextrocurvature is unchanged. IMPRESSION: Stable chest with no radiographic evidence of acute cardiopulmonary process. Electronically Signed   By: Lesia Hausen M.D.   On: 02/28/2023 19:13    Micro Results    No results found for this or any previous visit (from the past 240 hour(s)).  Today   Subjective    [name] today has no headache,no chest abdominal pain,no new weakness tingling or numbness, feels much better wants to go home today.    Objective   Blood pressure 124/73, pulse 62, temperature 97.6 F (36.4 C), temperature source Oral, resp. rate 15, height 6\' 2"  (1.88 m), weight 69.9 kg, SpO2 100%.   Intake/Output Summary (Last 24 hours) at 03/02/2023 1057 Last data filed at 03/01/2023 1232 Gross per 24 hour  Intake --  Output 800 ml  Net -  800 ml    Exam  Awake Alert, No new F.N deficits,    [place].AT,PERRAL Supple Neck,   Symmetrical Chest wall movement, Good air movement bilaterally, CTAB RRR,No Gallops,   +ve B.Sounds, Abd Soft, Non  tender,  No Cyanosis, Clubbing or edema    Data Review   Recent Labs  Lab 02/28/23 1340 02/28/23 1816 03/01/23 1748 03/02/23 0639  WBC  --  9.3  --  5.0  HGB 9.2* 8.1* 8.7* 8.2*  HCT 27.0* 27.4* 27.6* 25.7*  PLT  --  216  --  188  MCV  --  87.8  --  84.5  MCH  --  26.0  --  27.0  MCHC  --  29.6*  --  31.9  RDW  --  15.6*  --  15.8*  LYMPHSABS  --   --   --  0.8  MONOABS  --   --   --  0.5  EOSABS  --   --   --  0.1  BASOSABS  --   --   --  0.0    Recent Labs  Lab 02/28/23 1340 02/28/23 1816 02/28/23 2013 03/02/23 0639  NA 141 138  --  140  K 3.8 3.9  --  3.7  CL 105 106  --  106  CO2  --  22  --  25  ANIONGAP  --  10  --  9  GLUCOSE 99 123*  --  93  BUN 11 14  --  12  CREATININE 1.00 1.12  --  1.04  BNP  --   --  445.7* 514.5*  MG  --   --   --  1.8  CALCIUM  --  7.9*  --  7.8*    Total Time in preparing paper work, data evaluation and todays exam - 35 minutes  Signature  -    Susa Raring M.D on 03/02/2023 at 10:57 AM   -  To page go to [email or web address]

## 2023-03-02 NOTE — TOC Transition Note (Addendum)
Transition of Care [facility]) - CM/SW Discharge Note   Patient Details  Name: [name] MRN: [id number] Date of Birth: 06-15-1941  Transition of Care [facility]) CM/SW Contact:  Gordy Clement, RN Phone Number: 03/02/2023, 11:25 AM   Clinical Narrative:     UPDATE  1:51 PM  Patient has voiced concern bout going home today.  He wants another day to recover . Provider has agreed to him staying.  CM has spoken with Daughter and has provided a list of PCS companies.  Daughters have expressed interest in arranging someone so Patient has someone available for  "a day or two " at discharge.  RNCM weas asked to communicate with other Sister, Stark Klein as she is going out of the country. Stark Klein will be contact  [phone] [facility] Phone). [facility] will be providing HH SN/PT/OT/ Aide and SW and are hopeful to have Start of Care within 48 hours of DC. (Friday) CM will follow up with Daughter Stark Klein prior to DC to make sure they have PCS services lined up      Patient to DC to home today. Patient lives alone and states his Daughter will transport him home. He has declined the recommended tub/shower seat stating he will use his BSC instead.  Home Health SN/PT/OT /Aide/ SW will be provided by [facility].    No additional TOC needs           Patient Goals and CMS Choice      Discharge Placement                         Discharge Plan and Services Additional resources added to the After Visit Summary for                                       Social Determinants of Health (SDOH) Interventions SDOH Screenings   Depression (PHQ2-9): Low Risk  (02/23/2022)  Social Connections: Unknown (11/28/2021)   Received from [facility]  Tobacco Use: Low Risk  (02/28/2023)     Readmission Risk Interventions     No data to display

## 2023-03-02 NOTE — Discharge Instructions (Signed)
Do not drive, operate heavy machinery, perform activities at heights, swimming or participation in water activities or provide baby sitting services until you have seen by Primary MD or a Neurologist and advised to do so again.  Keep your foot laceration site clean and dry at all times.  Wet-to-dry dressing every other day.  Follow with Primary MD Renford Dills, MD in 3 days   Get CBC, CMP  -  checked next visit with your primary MD    Activity: As tolerated with Full fall precautions use walker/cane & assistance as needed  Disposition Home    Diet: Heart Healthy   Special Instructions: If you have smoked or chewed Tobacco  in the last 2 yrs please stop smoking, stop any regular Alcohol  and or any Recreational drug use.  On your next visit with your primary care physician please Get Medicines reviewed and adjusted.  Please request your Prim.MD to go over all Hospital Tests and Procedure/Radiological results at the follow up, please get all Hospital records sent to your Prim MD by signing hospital release before you go home.  If you experience worsening of your admission symptoms, develop shortness of breath, life threatening emergency, suicidal or homicidal thoughts you must seek medical attention immediately by calling 911 or calling your MD immediately  if symptoms less severe.  You Must read complete instructions/literature along with all the possible adverse reactions/side effects for all the Medicines you take and that have been prescribed to you. Take any new Medicines after you have completely understood and accpet all the possible adverse reactions/side effects.

## 2023-03-02 NOTE — Progress Notes (Signed)
Occupational Therapy Evaluation Patient Details Name: [name] MRN: [id number] DOB: 09/13/1940 Today's Date: 03/02/2023   History of Present Illness 82 y.o. male who presented to the ED with seizure like activity associated with fecal incontinence. Earlier in day in ED with Rt foot laceration with significant blood loss. EEG negative  PMHx of HTN, paroxysmal afib, CAD, CHF, R THA, AAA, short-term memory deficit   Clinical Impression   Pt admitted for above, PTA pt was independent in mobility and ADLs. Pt currently completing ambulation and ADLs with Supervision, he does note being unsteady without UE support and elected to use a RW. STS from lower surfaces seem to be a challenge for him a this time. Recommend pt also DC with a shower seat for safety to impaired balance to reduce risk of falls. Pt would benefit from continued acute skilled OT services while in acute setting to progress back to baseline balance during functional activity. No follow-up OT recommended this time.        If plan is discharge home, recommend the following:      Functional Status Assessment  Patient has had a recent decline in their functional status and demonstrates the ability to make significant improvements in function in a reasonable and predictable amount of time.  Equipment Recommendations  Tub/shower seat (with back)    Recommendations for Other Services       Precautions / Restrictions Precautions Precautions: Fall Restrictions Weight Bearing Restrictions: No      Mobility Bed Mobility Overal bed mobility: Modified Independent                  Transfers Overall transfer level: Needs assistance   Transfers: Sit to/from Stand Sit to Stand: Contact guard assist           General transfer comment: Pt needed rails to assist with STS from low surface. Had a difficult time and used momentum as well to aide in transition      Balance Overall balance assessment: Needs  assistance Sitting-balance support: No upper extremity supported, Feet supported Sitting balance-Leahy Scale: Good       Standing balance-Leahy Scale: Fair Standing balance comment: static standing no UE support, needs UE support with ambulation                           ADL either performed or assessed with clinical judgement   ADL Overall ADL's : Needs assistance/impaired Eating/Feeding: Independent;Sitting   Grooming: Standing;Supervision/safety   Upper Body Bathing: Sitting;Independent   Lower Body Bathing: Independent;Sitting/lateral leans   Upper Body Dressing : Independent;Sitting   Lower Body Dressing: Contact guard assist;Sit to/from stand   Toilet Transfer: Cueing for safety;Contact guard assist;Ambulation;Rolling walker (2 wheels)   Toileting- Clothing Manipulation and Hygiene: Supervision/safety;Sitting/lateral lean       Functional mobility during ADLs: Supervision/safety;Rolling walker (2 wheels) General ADL Comments: Pt initially felt unsteady without UE support and opted to use RW for hall ambulation. In hall he ambulated with RW + Supervision     Vision         Perception         Praxis         Pertinent Vitals/Pain Pain Assessment Pain Assessment: Faces Faces Pain Scale: Hurts little more Pain Location: Chronic back pain Pain Descriptors / Indicators: Constant, Aching Pain Intervention(s): Repositioned     Extremity/Trunk Assessment Upper Extremity Assessment Upper Extremity Assessment: Overall WFL for tasks assessed   Lower Extremity  Assessment Lower Extremity Assessment: Defer to PT evaluation   Cervical / Trunk Assessment Cervical / Trunk Assessment: Kyphotic   Communication Communication Communication: No apparent difficulties   Cognition Arousal: Alert Behavior During Therapy: WFL for tasks assessed/performed Overall Cognitive Status: Within Functional Limits for tasks assessed                                        General Comments  VSS    Exercises     Shoulder Instructions      Home Living Family/patient expects to be discharged to:: Private residence Living Arrangements: Alone Available Help at Discharge:  (no help at home) Type of Home: House Home Access: Stairs to enter [employer] of Steps: 1 (through garage)   Home Layout: Multi-level;Able to live on main level with bedroom/bathroom Alternate Level Stairs-Number of Steps: stays on main level   Bathroom Shower/Tub: Producer, television/film/video: Standard Bathroom Accessibility: Yes How Accessible: Accessible via walker Home Equipment: Rolling Walker (2 wheels);Cane - single point;Grab bars - toilet;Grab bars - tub/shower          Prior Functioning/Environment Prior Level of Function : Independent/Modified Independent;Driving             Mobility Comments: ind no AD ADLs Comments: ind        OT Problem List: Impaired balance (sitting and/or standing)      OT Treatment/Interventions: Self-care/ADL training;Balance training;Therapeutic activities;Patient/family education;DME and/or AE instruction    OT Goals(Current goals can be found in the care plan section) Acute Rehab OT Goals Patient Stated Goal: To go home OT Goal Formulation: With patient Time For Goal Achievement: 03/16/23 Potential to Achieve Goals: Good ADL Goals Pt Will Perform Grooming: with modified independence;standing Pt Will Perform Lower Body Dressing: with modified independence Pt Will Transfer to Toilet: with modified independence;ambulating Pt Will Perform Tub/Shower Transfer: Shower transfer;with modified independence  OT Frequency: Min 1X/week    Co-evaluation              AM-PAC OT "6 Clicks" Daily Activity     Outcome Measure Help from another person eating meals?: None Help from another person taking care of personal grooming?: A Little Help from another person toileting, which includes using  toliet, bedpan, or urinal?: A Little Help from another person bathing (including washing, rinsing, drying)?: A Little Help from another person to put on and taking off regular upper body clothing?: None Help from another person to put on and taking off regular lower body clothing?: A Little 6 Click Score: 20   End of Session Equipment Utilized During Treatment: Gait belt;Rolling walker (2 wheels) Nurse Communication: Mobility status  Activity Tolerance: Patient tolerated treatment well Patient left: in chair;with call bell/phone within reach;Other (comment) (SLP in room)  OT Visit Diagnosis: Unsteadiness on feet (R26.81);Other abnormalities of gait and mobility (R26.89)                Time: 1478-2956 OT Time Calculation (min): 21 min Charges:  OT General Charges $OT Visit: 1 Visit OT Evaluation $OT Eval Low Complexity: 1 Low  03/02/2023  AB, OTR/L  Acute Rehabilitation Services  Office: [phone]   Tristan Schroeder 03/02/2023, 9:30 AM

## 2023-03-02 NOTE — Evaluation (Signed)
Physical Therapy Evaluation and Discharge Patient Details Name: [name] MRN: [id number] DOB: April 19, 1941 Today's Date: 03/02/2023  History of Present Illness  82 y.o. male who presented to the ED with seizure like activity associated with fecal incontinence. Earlier in day in ED with Rt foot laceration with significant blood loss. EEG negative  PMHx of HTN, paroxysmal afib, CAD, CHF, R THA, AAA, short-term memory deficit  Clinical Impression   Patient evaluated by Physical Therapy with no further acute PT needs identified. All education has been completed and the patient has no further questions. Patient currently feels he needs to use RW and has one at home already. He reported to PT that he already uses RW in the home, yet told OT he only uses it sometimes. Educated on current need to use RW.  PT is signing off. Thank you for this referral.         If plan is discharge home, recommend the following:     Can travel by private vehicle        Equipment Recommendations None recommended by PT  Recommendations for Other Services       Functional Status Assessment Patient has not had a recent decline in their functional status     Precautions / Restrictions Precautions Precautions: Fall Restrictions Weight Bearing Restrictions: No      Mobility  Bed Mobility               General bed mobility comments: up in recliner    Transfers Overall transfer level: Modified independent Equipment used: Rolling walker (2 wheels) Transfers: Sit to/from Stand Sit to Stand: Modified independent (Device/Increase time)           General transfer comment: using armrests of chair    Ambulation/Gait Ambulation/Gait assistance: Supervision, Modified independent (Device/Increase time) Gait Distance (Feet): 200 Feet Assistive device: Rolling walker (2 wheels) Gait Pattern/deviations: WFL(Within Functional Limits)   Gait velocity interpretation: 1.31 - 2.62 ft/sec,  indicative of limited community ambulator   General Gait Details: initial cuing to look up/forward with pt continuing for remainder of walk; states he uses RW whenever he feels he needs to and currently feels he needs to  [employer] Mobility     Tilt Bed    Modified Rankin (Stroke Patients Only)       Balance Overall balance assessment: Needs assistance Sitting-balance support: No upper extremity supported, Feet supported Sitting balance-Leahy Scale: Good       Standing balance-Leahy Scale: Fair Standing balance comment: static standing no UE support, needs UE support with ambulation                             Pertinent Vitals/Pain Pain Assessment Pain Assessment: Faces Faces Pain Scale: Hurts little more Pain Location: Chronic back pain Pain Descriptors / Indicators: Constant, Aching Pain Intervention(s): Limited activity within patient's tolerance, Monitored during session    Home Living Family/patient expects to be discharged to:: Private residence Living Arrangements: Alone Available Help at Discharge: Family;Available PRN/intermittently (reports 2 daughters; one lives <30 minutes away and one ~2 hrs away) Type of Home: House Home Access: Stairs to enter Entrance Stairs-Rails: None Entrance Stairs-Number of Steps: 1 (through garage) Alternate Level Stairs-Number of Steps: stays on main level Home Layout: Multi-level;Able to live on main level with bedroom/bathroom Home Equipment: Rolling Walker (2 wheels);Cane - single point;Grab bars - toilet;Grab bars -  tub/shower      Prior Function Prior Level of Function : Independent/Modified Independent;Driving             Mobility Comments: reports he uses RW to PT (told OT he does not) ADLs Comments: ind     Extremity/Trunk Assessment   Upper Extremity Assessment Upper Extremity Assessment: Overall WFL for tasks assessed    Lower Extremity Assessment Lower Extremity  Assessment: Overall WFL for tasks assessed    Cervical / Trunk Assessment Cervical / Trunk Assessment: Kyphotic  Communication   Communication Communication: No apparent difficulties  Cognition Arousal: Alert Behavior During Therapy: WFL for tasks assessed/performed Overall Cognitive Status: Within Functional Limits for tasks assessed                                 General Comments: states he was told not to drive for 6 months, but not why (explained reasoning to pt)        General Comments General comments (skin integrity, edema, etc.): HR up to 124 with ambulation    Exercises     Assessment/Plan    PT Assessment Patient does not need any further PT services  PT Problem List         PT Treatment Interventions      PT Goals (Current goals can be found in the Care Plan section)  Acute Rehab PT Goals Patient Stated Goal: return home today PT Goal Formulation: All assessment and education complete, DC therapy    Frequency       Co-evaluation               AM-PAC PT "6 Clicks" Mobility  Outcome Measure Help needed turning from your back to your side while in a flat bed without using bedrails?: None Help needed moving from lying on your back to sitting on the side of a flat bed without using bedrails?: None Help needed moving to and from a bed to a chair (including a wheelchair)?: None Help needed standing up from a chair using your arms (e.g., wheelchair or bedside chair)?: None Help needed to walk in hospital room?: None Help needed climbing 3-5 steps with a railing? : None 6 Click Score: 24    End of Session Equipment Utilized During Treatment: Gait belt Activity Tolerance: Patient tolerated treatment well Patient left: in chair;with call bell/phone within reach;with chair alarm set   PT Visit Diagnosis: Difficulty in walking, not elsewhere classified (R26.2)    Time: 1610-9604 PT Time Calculation (min) (ACUTE ONLY): 15  min   Charges:   PT Evaluation $PT Eval Low Complexity: 1 Low   PT General Charges $$ ACUTE PT VISIT: 1 Visit          Jerolyn Center, PT Acute Rehabilitation Services  Office [phone]   Zena Amos 03/02/2023, 10:43 AM

## 2023-03-03 ENCOUNTER — Telehealth: Payer: Self-pay | Admitting: Cardiology

## 2023-03-03 DIAGNOSIS — R569 Unspecified convulsions: Secondary | ICD-10-CM | POA: Diagnosis not present

## 2023-03-03 NOTE — TOC Transition Note (Signed)
Transition of Care [facility]) - CM/SW Discharge Note   Patient Details  Name: MASSIAH BULLS MRN: [id number] Date of Birth: 12/31/1940  [facility] of [facility] CM/SW Contact:  Gordy Clement, RN Phone Number: 03/03/2023, 9:11 AM   Clinical Narrative:     Patient will DC to home today. [facility] is arranged for SN/PT/OT/ Aide/ SW. Daughter Stark Klein states she will be here by 11:00 to pick patient up.  Family has arranged someone to stay a couple of nights with the patient after DC. Rolling walker has been delivered to bedside from [employer]   No additional TOC needs              Patient Goals and CMS Choice      Discharge Placement                         Discharge Plan and Services Additional resources added to the After Visit Summary for                                       Social Determinants of Health (SDOH) Interventions SDOH Screenings   Depression (PHQ2-9): Low Risk  (02/23/2022)  Social Connections: Unknown (11/28/2021)   Received from [facility]  Tobacco Use: Low Risk  (02/28/2023)     Readmission Risk Interventions     No data to display

## 2023-03-03 NOTE — Progress Notes (Signed)
Occupational Therapy Treatment Patient Details Name: [name] MRN: [id number] DOB: Feb 22, 1941 Today's Date: 03/03/2023   History of present illness 82 y.o. male who presented to the ED with seizure like activity associated with fecal incontinence. Earlier in day in ED with Rt foot laceration with significant blood loss. EEG negative  PMHx of HTN, paroxysmal afib, CAD, CHF, R THA, AAA, short-term memory deficit   OT comments  Pt progressed well in today's OT session, Pt at a good functional level and his goals are adequate for him to DC, pt ambulating with Supervision and completes STS without physical assist with good hand placement. Discussed adapting home with non slip bath mats to reduce risk of falls in/out the shower. Pt demonstrated ability to complete bathing while standing with Mod I, pt has no further acute skilled OT needs at this time. No follow-up OT recommended and DC recs changed to no DME.       If plan is discharge home, recommend the following:      Equipment Recommendations  None recommended by OT    Recommendations for Other Services      Precautions / Restrictions Precautions Precautions: Fall Restrictions Weight Bearing Restrictions: No       Mobility Bed Mobility               General bed mobility comments: Pt sititng in low chair on OT arrival and preferred to sit there to eat breakfast at end of OT session    Transfers Overall transfer level: Modified independent Equipment used: Rolling walker (2 wheels) Transfers: Sit to/from Stand Sit to Stand: Modified independent (Device/Increase time)           General transfer comment: using armrests of chair     Balance Overall balance assessment: Needs assistance Sitting-balance support: No upper extremity supported, Feet supported Sitting balance-Leahy Scale: Good       Standing balance-Leahy Scale: Fair Standing balance comment: static standing no UE support, needs UE support with  ambulation                           ADL either performed or assessed with clinical judgement   ADL Overall ADL's : Needs assistance/impaired         Upper Body Bathing: Standing;Modified independent Upper Body Bathing Details (indicate cue type and reason): Simulated Lower Body Bathing: Sit to/from stand;Modified independent Lower Body Bathing Details (indicate cue type and reason): simulated                     Functional mobility during ADLs: Supervision/safety;Rolling walker (2 wheels) General ADL Comments: Discussed with pt the use of non slip mats in bathroom to reduce risk of falls in/out of shower, pt ambulating ~150 ft in hall with RW, declined need for ADLs and this time.    Extremity/Trunk Assessment              Vision       Perception     Praxis      Cognition Arousal: Alert Behavior During Therapy: WFL for tasks assessed/performed Overall Cognitive Status: Within Functional Limits for tasks assessed                                          Exercises      Shoulder Instructions  General Comments VSS on RA    Pertinent Vitals/ Pain       Pain Assessment Pain Assessment: Faces Faces Pain Scale: Hurts little more Pain Location: Chronic back pain Pain Descriptors / Indicators: Constant, Aching Pain Intervention(s): Monitored during session, Repositioned  Home Living                                          Prior Functioning/Environment              Frequency  Min 1X/week        Progress Toward Goals  OT Goals(current goals can now be found in the care plan section)  Progress towards OT goals: Progressing toward goals  Acute Rehab OT Goals OT Goal Formulation: With patient Time For Goal Achievement: 03/16/23 Potential to Achieve Goals: Good  Plan      Co-evaluation                 AM-PAC OT "6 Clicks" Daily Activity     Outcome Measure   Help from another  person eating meals?: None Help from another person taking care of personal grooming?: A Little Help from another person toileting, which includes using toliet, bedpan, or urinal?: A Little Help from another person bathing (including washing, rinsing, drying)?: None Help from another person to put on and taking off regular upper body clothing?: None Help from another person to put on and taking off regular lower body clothing?: A Little 6 Click Score: 21    End of Session Equipment Utilized During Treatment: Gait belt;Rolling walker (2 wheels)  OT Visit Diagnosis: Unsteadiness on feet (R26.81);Other abnormalities of gait and mobility (R26.89)   Activity Tolerance Patient tolerated treatment well   Patient Left in chair;with call bell/phone within reach   Nurse Communication Mobility status        Time: 9147-8295 OT Time Calculation (min): 13 min  Charges: OT General Charges $OT Visit: 1 Visit OT Treatments $Therapeutic Activity: 8-22 mins  03/03/2023  AB, OTR/L  Acute Rehabilitation Services  Office: [phone]   Tristan Schroeder 03/03/2023, 10:54 AM

## 2023-03-03 NOTE — Progress Notes (Signed)
[facility]                                                                                                                                                                         Patient Demographics  [name], is a 82 y.o. male  QQV:[id number]  PPI:[id number]  DOB - [birth year]  Admit date - 02/28/2023  Admitting Physician Andris Baumann, MD  Outpatient Primary MD for the patient is Renford Dills, MD  LOS - 1   Chief Complaint  Patient presents with   Seizures        Assessment & Plan    Patient seen briefly today due for discharge soon per Discharge done yesterday by me, no further issues, Vital signs stable, patient feels fine.  Yesterday he got nervous going home he feels better today and eager to go home.    Medications  Scheduled Meds:  sodium chloride   Intravenous Once   amiodarone  200 mg Oral Daily   apixaban  2.5 mg Oral BID   atorvastatin  20 mg Oral Daily   carvedilol  3.125 mg Oral BID WC   furosemide  20 mg Oral QODAY   multivitamin with minerals  1 tablet Oral Daily   omega-3 acid ethyl esters  1 capsule Oral Daily   Continuous Infusions: PRN Meds:.acetaminophen **OR** acetaminophen, LORazepam, LORazepam    Time Spent in minutes   10 minutes   Susa Raring M.D on 03/03/2023 at 9:31 AM  Between 7am to 7pm - Pager - [phone]  After 7pm go to [email or web address] - password [email or web address]  And look for the night coverage person covering for me after hours  [facility] Office  [phone]    Subjective:   [name] today has, No headache, No chest pain, No abdominal pain - No Nausea, No new weakness tingling or numbness, No Cough - SOB.   Objective:   Vitals:   03/02/23 1121 03/02/23 1655 03/03/23 0500 03/03/23 0800  BP:   (!) 148/75   Pulse:   71   Resp:   15 20  Temp:   97.9 F (36.6 C) 98.3 F (36.8 C)  TempSrc: Oral Oral Oral Oral   SpO2:  94% 96%   Weight:      Height:        Wt Readings from Last 3 Encounters:  03/01/23 69.9 kg  02/28/23 69.9 kg  01/25/23 67.6 kg     Intake/Output Summary (Last 24 hours) at 03/03/2023 0931 Last data filed at 03/02/2023 1700 Gross per 24 hour  Intake 1320 ml  Output --  Net 1320 ml    Exam  Awake Alert, No new F.N deficits, Normal affect [place].AT,PERRAL Supple Neck, No JVD,  Symmetrical Chest wall movement, Good air movement bilaterally, CTAB RRR,No Gallops, Rubs or new Murmurs,  +ve B.Sounds, Abd Soft, No tenderness,   No Cyanosis, Clubbing or edema   Data Review

## 2023-03-03 NOTE — Plan of Care (Signed)
  Problem: Acute Rehab OT Goals (only OT should resolve) Goal: Pt. Will Perform Tub/Shower Transfer 03/03/2023 1047 by Tristan Schroeder, OT Outcome: Adequate for Discharge 03/03/2023 1046 by Tristan Schroeder, OT Outcome: Adequate for Discharge   Problem: Acute Rehab OT Goals (only OT should resolve) Goal: Pt. Will Transfer To Toilet 03/03/2023 1047 by Tristan Schroeder, OT Outcome: Adequate for Discharge 03/03/2023 1046 by Tristan Schroeder, OT Outcome: Adequate for Discharge   Problem: Acute Rehab OT Goals (only OT should resolve) Goal: Pt. Will Perform Lower Body Dressing 03/03/2023 1047 by Tristan Schroeder, OT Outcome: Adequate for Discharge 03/03/2023 1046 by Tristan Schroeder, OT Outcome: Adequate for Discharge   Problem: Acute Rehab OT Goals (only OT should resolve) Goal: Pt. Will Perform Grooming 03/03/2023 1047 by Tristan Schroeder, OT Outcome: Adequate for Discharge 03/03/2023 1046 by Tristan Schroeder, OT Outcome: Adequate for Discharge

## 2023-03-03 NOTE — Telephone Encounter (Signed)
Spoke to patient and to dtr Erskine Speed. They are concerned about patient's eliquis dose. They state patient received 2.5 mg BID dose while in hospital but on discharge dose was changed to 5 mg BID. Stark Klein states that patient has had several bleeding episodes leading to anemia in the past 8 weeks and they are concerned that even the 2.5 mg BID dose is too high. They are in agreement to have patient evaluated for Watchman procedure but are wondering if there is an anticoagulant that would be safer for patient in the meantime. Patient responses forwarded to Dr. Mayford Knife.

## 2023-03-03 NOTE — Telephone Encounter (Signed)
Pt c/o medication issue:  1. Name of Medication: apixaban (ELIQUIS) 5 MG TABS tablet   2. How are you currently taking this medication (dosage and times per day)? Take 1 tablet (5 mg total) by mouth 2 (two) times daily.Patient taking differently: Take 2.5 mg by mouth 2 (two) times daily   3. Are you having a reaction (difficulty breathing--STAT)? No  4. What is your medication issue? Pt's daughter would like a callback regarding whether medication dosage is able to be adjusted due to recent hospital visit. Please advise

## 2023-03-05 NOTE — Telephone Encounter (Signed)
Called patient and daughter [name] back regarding Dr. Norris Cross recommendation for eliquis dosage. Patient is currently taking 2.5 mg BID, requesting dose to be lowered due to several bleeding episodes recently. Also asking if there is any anticoagulant that is safer than eliquis for bleeding. Per Dr. Mayford Knife, eliquis is safest. Also explained that there is higher risk of cardioembolic event on lower dose of eliquis. Patient and dtr explain they will continue taking 2.5 mg dose BID as they are uncomfortable with bleeding risk on higher dose and will wait to see Dr. Mayford Knife or have visit with Dr. Sarajane Jews team for [facility] procedure.

## 2023-03-05 NOTE — Telephone Encounter (Signed)
Spoke with the patient at length about the Watchman device, the implant, and recovery. Offered to arrange consult but he declined at this time. He wants to speak with his daughter and will call back if he wishes to be scheduled.

## 2023-03-09 ENCOUNTER — Other Ambulatory Visit: Payer: Self-pay | Admitting: Cardiology

## 2023-03-17 ENCOUNTER — Encounter: Payer: Self-pay | Admitting: Infectious Disease

## 2023-03-17 ENCOUNTER — Ambulatory Visit: Payer: Medicare HMO | Admitting: Infectious Disease

## 2023-03-17 ENCOUNTER — Other Ambulatory Visit: Payer: Self-pay

## 2023-03-17 VITALS — BP 160/96 | HR 98 | Temp 97.7°F | Wt 146.0 lb

## 2023-03-17 DIAGNOSIS — A318 Other mycobacterial infections: Secondary | ICD-10-CM | POA: Diagnosis not present

## 2023-03-17 DIAGNOSIS — I4891 Unspecified atrial fibrillation: Secondary | ICD-10-CM | POA: Diagnosis not present

## 2023-03-17 DIAGNOSIS — R9431 Abnormal electrocardiogram [ECG] [EKG]: Secondary | ICD-10-CM | POA: Diagnosis not present

## 2023-03-17 NOTE — Progress Notes (Signed)
Subjective:    Chief complaint: Follow-up for mycobacterial infection   Patient ID: [name], male    DOB: 1940-11-03, 82 y.o.   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 [place] and has had history of basal cell carcinoma squamous cell carcinoma.  On August 19, 2021 he underwent Mohs procedure to right medial zygoma squamous cell carcinoma.  This was successfully cured but unfortunately he developed a lesion there that has been subsequently biopsied and found to have granulomas and mycobacteria seen on stain.  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  Susceptibilities are shown below though these were prior to macrolide S being known and organism was macrolide sensitive     We ended up starting clofazimine 2 tablets daily with follow-up with cardiology and review of EKG that showed no worsening of her QT prolongation and then we added azithromycin 500 mg daily again with follow-up with cardiology and stability on EKG if QT.  Says he is not suffering from any adverse effects from the antibiotics and he feels the lesion on his face is shrinking somewhat.  He did have concerns re a pedunculated area that was present on one occasion when  I saw him and did  not appear related to his infection.  Been continued on antimycobacterial drugs though he did have some problems with QT prolongation that prompted with some azithromycin to [facility] with clofazimine that he has now been on.  He has completed well over a year of therapy at this point he really should have his mycobacterial infection cured at this point.    Past Medical History:  Diagnosis Date   Ascending aorta dilatation  (HCC)    44mm by chest CT angio 04/2018   CAD (coronary artery disease)    a. 05/2006 Abnl stress test w/ 2-29mm ST dep; b. 05/2006 Cath/PCI: LM nl, LAD 30-40ost, 20-30p, 90d (small), D1 nl, D2 nl, LCX nl, OM1/2 nl, RCA Ca2+, 74m/d (3.5x16 Liberty BMS & 3.5x12 Liberty BMS), EF 50%.   Chronic diastolic CHF (congestive heart failure) (HCC)    a. 10/2017 Echo: EF 50-55%, mild AI, sev dil LA, mod dil RA.   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 CHA2DS2VASc 5-->Eliquis; b. Prev WCT on flecainide;  c. 12/2017 s/p DCCV.   Pneumothorax    Prolonged Q-T interval on ECG 06/22/2022   Ventricular tachycardia (HCC)    a. possibly proarrhythmia from flecainide.    Past Surgical History:  Procedure Laterality Date   CARDIOVERSION N/A 12/23/2017   Procedure: CARDIOVERSION;  Surgeon: Quintella Reichert, MD;  Location: [facility] ENDOSCOPY;  Service: Cardiovascular;  Laterality: N/A;   CARDIOVERSION N/A 03/08/2018   Procedure: CARDIOVERSION;  Surgeon: Quintella Reichert, MD;  Location: [facility] ENDOSCOPY;  Service: Cardiovascular;  Laterality: N/A;   INGUINAL HERNIA REPAIR     x2   left total hip surgery     TEE WITHOUT CARDIOVERSION N/A 03/08/2018   Procedure: TRANSESOPHAGEAL ECHOCARDIOGRAM (TEE);  Surgeon: Quintella Reichert, MD;  Location: [facility] ENDOSCOPY;  Service: Cardiovascular;  Laterality: N/A;   TOTAL HIP ARTHROPLASTY Right 08/19/2020   Procedure: TOTAL HIP ARTHROPLASTY  ANTERIOR APPROACH;  Surgeon: Ollen Gross, MD;  Location: [facility] ORS;  Service: Orthopedics;  Laterality: Right;    TOTAL KNEE ARTHROPLASTY Left 02/28/2018   Procedure: LEFT TOTAL KNEE ARTHROPLASTY;  Surgeon: Ollen Gross, MD;  Location: [facility] ORS;  Service: Orthopedics;  Laterality: Left;   VEIN LIGATION AND STRIPPING Left 04/08/2020   Procedure: LEFT LOWER EXTREMITY VEIN LIGATION AND EXCISION OF ULCER;  Surgeon: Chuck Hint, MD;   Location: [facility] OR;  Service: Vascular;  Laterality: Left;    Family History  Problem Relation Age of Onset   Coronary artery disease Other       Social History   Socioeconomic History   Marital status: Single    Spouse name: Not on file   Number of children: Not on file   Years of education: Not on file   Highest education level: Not on file  Occupational History   Occupation: retired  Tobacco Use   Smoking status: Never   Smokeless tobacco: Never  Vaping Use   Vaping status: Never Used  Substance and Sexual Activity   Alcohol use: Not Currently    Comment: occsaoinal   Drug use: Not Currently    Types: Other-see comments   Sexual activity: Not Currently  Other Topics Concern   Not on file  Social History Narrative   ** Merged History Encounter **       Lives in [place] by himself but 2 dtrs nearby and help out when necessary.   Social Determinants of Health   Financial Resource Strain: Not on file  Food Insecurity: Not on file  Transportation Needs: Not on file  Physical Activity: Not on file  Stress: Not on file  Social Connections: Unknown (11/28/2021)   Received from [facility]   Social Network    Social Network: Not on file    No Known Allergies   Current Outpatient Medications:    acetaminophen (TYLENOL) 325 MG tablet, Take 2 tablets (650 mg total) by mouth every 6 (six) hours as needed for moderate pain., Disp: 30 tablet, Rfl: 0   amiodarone (PACERONE) 200 MG tablet, TAKE 1 TABLET(200 MG) BY MOUTH DAILY, Disp: 90 tablet, Rfl: 1   apixaban (ELIQUIS) 5 MG TABS tablet, Take 1 tablet (5 mg total) by mouth 2 (two) times daily. (Patient taking differently: Take 2.5 mg by mouth 2 (two) times daily.), Disp: 180 tablet, Rfl: 1   atorvastatin (LIPITOR) 20 MG tablet, TAKE 1 TABLET(20 MG) BY MOUTH DAILY, Disp: 90 tablet, Rfl: 0   Boswellia Serrata (BOSWELLIA PO), Take 1,200 mg by mouth daily., Disp: , Rfl:    carvedilol (COREG) 3.125 MG tablet, Take 3.125 mg by mouth  daily., Disp: , Rfl:    clofazimine 50 mg CAPS capsule (for compassionate use), Take 2 capsules (100 mg total) by mouth daily with breakfast., Disp: 100 capsule, Rfl: 1   furosemide (LASIX) 20 MG tablet, Take 1 tablet (20 mg total) by mouth every other day., Disp: 90 tablet, Rfl: 3   lidocaine (LIDODERM) 5 %, Place 1 patch onto the skin daily., Disp: , Rfl:    Misc Natural Products (GLUCOSAMINE CHONDROITIN TRIPLE) TABS, Take 1 tablet by mouth daily., Disp: , Rfl:    Multiple Vitamin (MULTIVITAMIN WITH MINERALS) TABS tablet, Take 1 tablet by mouth daily., Disp: , Rfl:    Omadacycline Tosylate 150 MG TABS, Take 2 tablets (300 mg total) by mouth daily., Disp: 30 tablet, Rfl:    Omega-3 Fatty Acids (FISH OIL) 1200 MG CAPS, Take  1,200 mg by mouth daily., Disp: , Rfl:     Review of Systems  Constitutional:  Negative for activity change, appetite change, chills, diaphoresis, fatigue, fever and unexpected weight change.  HENT:  Positive for hearing loss. Negative for congestion, rhinorrhea, sinus pressure, sneezing, sore throat and trouble swallowing.   Eyes:  Negative for photophobia and visual disturbance.  Respiratory:  Negative for cough, chest tightness, shortness of breath, wheezing and stridor.   Cardiovascular:  Negative for chest pain, palpitations and leg swelling.  Gastrointestinal:  Negative for abdominal distention, abdominal pain, anal bleeding, blood in stool, constipation, diarrhea, nausea and vomiting.  Genitourinary:  Negative for difficulty urinating, dysuria, flank pain and hematuria.  Musculoskeletal:  Negative for arthralgias, back pain, gait problem, joint swelling and myalgias.  Skin:  Negative for color change, pallor, rash and wound.  Neurological:  Negative for dizziness, tremors, weakness and light-headedness.  Hematological:  Negative for adenopathy. Does not bruise/bleed easily.  Psychiatric/Behavioral:  Negative for agitation, behavioral problems, confusion, decreased  concentration, dysphoric mood and sleep disturbance.        Objective:   Physical Exam Constitutional:      Appearance: He is well-developed.  HENT:     Head: Normocephalic and atraumatic.  Eyes:     Conjunctiva/sclera: Conjunctivae normal.  Cardiovascular:     Rate and Rhythm: Normal rate and regular rhythm.  Pulmonary:     Effort: Pulmonary effort is normal. No respiratory distress.     Breath sounds: No wheezing.  Abdominal:     General: There is no distension.     Palpations: Abdomen is soft.  Musculoskeletal:        General: No tenderness. Normal range of motion.     Cervical back: Normal range of motion and neck supple.  Skin:    General: Skin is warm and dry.     Coloration: Skin is not pale.     Findings: No erythema or rash.  Neurological:     General: No focal deficit present.     Mental Status: He is alert and oriented to person, place, and time.  Psychiatric:        Mood and Affect: Mood normal.        Behavior: Behavior normal.        Thought Content: Thought content normal.        Judgment: Judgment normal.     Lesion on zygoma 12/16/2021:    02/23/2022:    03/18/2023:      Assessment & Plan:   Mycobacterium chelonae eye infection: This soft tissue infection should have long since been cured since he has had well over a year of treatment with 2 active drugs.  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  Fortunately he is not on azithromycin anymore at this point.  Tachycardia and history of atrial fibrillation: Following up with cardiology  I have personally spent 26  minutes involved in face-to-face and non-face-to-face activities for this patient on the day of the visit. Professional time spent includes the following activities:  Preparing to see the patient (review of tests), Obtaining and/or reviewing separately obtained history (admission/discharge record), Performing a medically appropriate examination and/or evaluation , Ordering medications/tests/procedures, referring and communicating with other health care professionals, Documenting clinical information in the EMR, Independently interpreting results (not separately reported), Communicating results to the patient/family/caregiver, Counseling and educating the patient/family/caregiver and Care coordination (not separately reported).

## 2023-03-18 DIAGNOSIS — R569 Unspecified convulsions: Secondary | ICD-10-CM | POA: Diagnosis not present

## 2023-03-18 DIAGNOSIS — I872 Venous insufficiency (chronic) (peripheral): Secondary | ICD-10-CM | POA: Diagnosis not present

## 2023-03-18 DIAGNOSIS — E7800 Pure hypercholesterolemia, unspecified: Secondary | ICD-10-CM | POA: Diagnosis not present

## 2023-03-18 DIAGNOSIS — I10 Essential (primary) hypertension: Secondary | ICD-10-CM | POA: Diagnosis not present

## 2023-03-18 DIAGNOSIS — Z23 Encounter for immunization: Secondary | ICD-10-CM | POA: Diagnosis not present

## 2023-03-18 DIAGNOSIS — M797 Fibromyalgia: Secondary | ICD-10-CM | POA: Diagnosis not present

## 2023-03-18 DIAGNOSIS — Z0000 Encounter for general adult medical examination without abnormal findings: Secondary | ICD-10-CM | POA: Diagnosis not present

## 2023-03-18 DIAGNOSIS — I480 Paroxysmal atrial fibrillation: Secondary | ICD-10-CM | POA: Diagnosis not present

## 2023-03-18 DIAGNOSIS — Z1331 Encounter for screening for depression: Secondary | ICD-10-CM | POA: Diagnosis not present

## 2023-03-18 DIAGNOSIS — D649 Anemia, unspecified: Secondary | ICD-10-CM | POA: Diagnosis not present

## 2023-03-18 DIAGNOSIS — I5032 Chronic diastolic (congestive) heart failure: Secondary | ICD-10-CM | POA: Diagnosis not present

## 2023-03-18 DIAGNOSIS — I2510 Atherosclerotic heart disease of native coronary artery without angina pectoris: Secondary | ICD-10-CM | POA: Diagnosis not present

## 2023-03-18 DIAGNOSIS — I700 Atherosclerosis of aorta: Secondary | ICD-10-CM | POA: Diagnosis not present

## 2023-03-25 ENCOUNTER — Ambulatory Visit: Payer: Medicare HMO | Attending: Cardiology | Admitting: Cardiology

## 2023-03-25 ENCOUNTER — Observation Stay (HOSPITAL_COMMUNITY)
Admission: EM | Admit: 2023-03-25 | Discharge: 2023-03-26 | Disposition: A | Discharge status: Home / Self Care | Payer: Medicare HMO | Attending: Emergency Medicine | Admitting: Emergency Medicine

## 2023-03-25 ENCOUNTER — Encounter (HOSPITAL_COMMUNITY): Payer: Self-pay

## 2023-03-25 ENCOUNTER — Emergency Department (HOSPITAL_COMMUNITY): Payer: Medicare HMO

## 2023-03-25 ENCOUNTER — Other Ambulatory Visit: Payer: Self-pay

## 2023-03-25 ENCOUNTER — Encounter: Payer: Self-pay | Admitting: Cardiology

## 2023-03-25 VITALS — BP 140/90 | HR 75 | Ht 74.0 in | Wt 147.2 lb

## 2023-03-25 DIAGNOSIS — Z96642 Presence of left artificial hip joint: Secondary | ICD-10-CM | POA: Diagnosis not present

## 2023-03-25 DIAGNOSIS — L0390 Cellulitis, unspecified: Secondary | ICD-10-CM | POA: Diagnosis present

## 2023-03-25 DIAGNOSIS — I2510 Atherosclerotic heart disease of native coronary artery without angina pectoris: Secondary | ICD-10-CM

## 2023-03-25 DIAGNOSIS — I7121 Aneurysm of the ascending aorta, without rupture: Secondary | ICD-10-CM

## 2023-03-25 DIAGNOSIS — Z7901 Long term (current) use of anticoagulants: Secondary | ICD-10-CM | POA: Insufficient documentation

## 2023-03-25 DIAGNOSIS — I480 Paroxysmal atrial fibrillation: Secondary | ICD-10-CM | POA: Diagnosis not present

## 2023-03-25 DIAGNOSIS — I110 Hypertensive heart disease with heart failure: Secondary | ICD-10-CM | POA: Insufficient documentation

## 2023-03-25 DIAGNOSIS — R55 Syncope and collapse: Secondary | ICD-10-CM

## 2023-03-25 DIAGNOSIS — Z96652 Presence of left artificial knee joint: Secondary | ICD-10-CM | POA: Insufficient documentation

## 2023-03-25 DIAGNOSIS — J929 Pleural plaque without asbestos: Secondary | ICD-10-CM | POA: Diagnosis not present

## 2023-03-25 DIAGNOSIS — I10 Essential (primary) hypertension: Secondary | ICD-10-CM | POA: Insufficient documentation

## 2023-03-25 DIAGNOSIS — I5032 Chronic diastolic (congestive) heart failure: Secondary | ICD-10-CM

## 2023-03-25 DIAGNOSIS — I517 Cardiomegaly: Secondary | ICD-10-CM | POA: Diagnosis not present

## 2023-03-25 DIAGNOSIS — E785 Hyperlipidemia, unspecified: Secondary | ICD-10-CM | POA: Diagnosis not present

## 2023-03-25 DIAGNOSIS — Z79899 Other long term (current) drug therapy: Secondary | ICD-10-CM | POA: Insufficient documentation

## 2023-03-25 DIAGNOSIS — L03115 Cellulitis of right lower limb: Principal | ICD-10-CM

## 2023-03-25 DIAGNOSIS — I4819 Other persistent atrial fibrillation: Secondary | ICD-10-CM | POA: Diagnosis not present

## 2023-03-25 DIAGNOSIS — I77819 Aortic ectasia, unspecified site: Secondary | ICD-10-CM | POA: Diagnosis not present

## 2023-03-25 LAB — CBC WITH DIFFERENTIAL/PLATELET
Abs Immature Granulocytes: 0.04 10*3/uL (ref 0.00–0.07)
Basophils Absolute: 0 10*3/uL (ref 0.0–0.1)
Basophils Relative: 0 %
Eosinophils Absolute: 0 10*3/uL (ref 0.0–0.5)
Eosinophils Relative: 0 %
HCT: 28.3 % — ABNORMAL LOW (ref 39.0–52.0)
Hemoglobin: 8.5 g/dL — ABNORMAL LOW (ref 13.0–17.0)
Immature Granulocytes: 1 %
Lymphocytes Relative: 8 %
Lymphs Abs: 0.7 10*3/uL (ref 0.7–4.0)
MCH: 25.8 pg — ABNORMAL LOW (ref 26.0–34.0)
MCHC: 30 g/dL (ref 30.0–36.0)
MCV: 86 fL (ref 80.0–100.0)
Monocytes Absolute: 0.7 10*3/uL (ref 0.1–1.0)
Monocytes Relative: 8 %
Neutro Abs: 7.1 10*3/uL (ref 1.7–7.7)
Neutrophils Relative %: 83 %
Platelets: 238 10*3/uL (ref 150–400)
RBC: 3.29 MIL/uL — ABNORMAL LOW (ref 4.22–5.81)
RDW: 15.6 % — ABNORMAL HIGH (ref 11.5–15.5)
WBC: 8.6 10*3/uL (ref 4.0–10.5)
nRBC: 0 % (ref 0.0–0.2)

## 2023-03-25 LAB — COMPREHENSIVE METABOLIC PANEL
ALT: 29 U/L (ref 0–44)
AST: 34 U/L (ref 15–41)
Albumin: 2.7 g/dL — ABNORMAL LOW (ref 3.5–5.0)
Alkaline Phosphatase: 57 U/L (ref 38–126)
Anion gap: 8 (ref 5–15)
BUN: 17 mg/dL (ref 8–23)
CO2: 24 mmol/L (ref 22–32)
Calcium: 8.3 mg/dL — ABNORMAL LOW (ref 8.9–10.3)
Chloride: 104 mmol/L (ref 98–111)
Creatinine, Ser: 1.11 mg/dL (ref 0.61–1.24)
GFR, Estimated: >60 mL/min (ref >60)
Glucose, Bld: 98 mg/dL (ref 70–99)
Potassium: 4 mmol/L (ref 3.5–5.1)
Sodium: 136 mmol/L (ref 135–145)
Total Bilirubin: 0.4 mg/dL (ref 0.3–1.2)
Total Protein: 5.5 g/dL — ABNORMAL LOW (ref 6.5–8.1)

## 2023-03-25 LAB — I-STAT CG4 LACTIC ACID, ED
Lactic Acid, Venous: 0.8 mmol/L (ref 0.5–1.9)
Lactic Acid, Venous: 0.6 mmol/L (ref 0.5–1.9)

## 2023-03-25 LAB — HEMOGLOBIN A1C
Hgb A1c MFr Bld: 5.8 % — ABNORMAL HIGH (ref 4.8–5.6)
Mean Plasma Glucose: 119.76 mg/dL

## 2023-03-25 MED ORDER — FUROSEMIDE 20 MG PO TABS
20.0000 mg | ORAL_TABLET | ORAL | Status: DC
  Administered 2023-03-26: 20 mg via ORAL
  Filled 2023-03-25: qty 1

## 2023-03-25 MED ORDER — ACETAMINOPHEN 325 MG PO TABS: 650.0000 mg | ORAL_TABLET | Freq: Four times a day (QID) | ORAL | Status: DC | PRN

## 2023-03-25 MED ORDER — APIXABAN 2.5 MG PO TABS
2.5000 mg | ORAL_TABLET | Freq: Two times a day (BID) | ORAL | Status: DC
  Administered 2023-03-26: 2.5 mg via ORAL
  Filled 2023-03-25 (×2): qty 1

## 2023-03-25 MED ORDER — CARVEDILOL 3.125 MG PO TABS
3.1250 mg | ORAL_TABLET | Freq: Every day | ORAL | Status: DC
  Administered 2023-03-26: 3.125 mg via ORAL
  Filled 2023-03-25 (×2): qty 1

## 2023-03-25 MED ORDER — SODIUM CHLORIDE 0.9% FLUSH
3.0000 mL | Freq: Two times a day (BID) | INTRAVENOUS | Status: DC
  Administered 2023-03-25 – 2023-03-26 (×2): 3 mL via INTRAVENOUS

## 2023-03-25 MED ORDER — SODIUM CHLORIDE 0.9 % IV SOLN
1.0000 g | Freq: Once | INTRAVENOUS | Status: AC
  Administered 2023-03-25: 1 g via INTRAVENOUS

## 2023-03-25 MED ORDER — SODIUM CHLORIDE 0.9 % IV SOLN
1.0000 g | Freq: Every day | INTRAVENOUS | Status: DC
  Administered 2023-03-26: 1 g via INTRAVENOUS
  Filled 2023-03-25: qty 10

## 2023-03-25 MED ORDER — SODIUM CHLORIDE 0.9% FLUSH: 3.0000 mL | INTRAVENOUS | Status: DC | PRN

## 2023-03-25 MED ORDER — HYDROCODONE-ACETAMINOPHEN 5-325 MG PO TABS: 1.0000 | ORAL_TABLET | Freq: Four times a day (QID) | ORAL | Status: DC | PRN

## 2023-03-25 MED ORDER — SODIUM CHLORIDE 0.9 % IV SOLN: 250.0000 mL | INTRAVENOUS | Status: DC | PRN

## 2023-03-25 MED ORDER — DOCUSATE SODIUM 100 MG PO CAPS
100.0000 mg | ORAL_CAPSULE | Freq: Two times a day (BID) | ORAL | Status: DC
  Filled 2023-03-25 (×2): qty 1

## 2023-03-25 MED ORDER — ATORVASTATIN CALCIUM 10 MG PO TABS
20.0000 mg | ORAL_TABLET | Freq: Every day | ORAL | Status: DC
  Administered 2023-03-26: 20 mg via ORAL
  Filled 2023-03-25: qty 2

## 2023-03-25 NOTE — ED Triage Notes (Signed)
Right leg is red swollen and has two pustules to right leg.  Patients leg is weeping.

## 2023-03-25 NOTE — ED Notes (Signed)
PT well appearing upon transport upstairs.

## 2023-03-25 NOTE — Hospital Course (Addendum)
Patient is 82 years old male with past medical history of coronary artery disease, hypertension, atrial fibrillation and hyperlipidemia with history of Mycobacterium infection of the face presented to the hospital with swelling and redness of the right lower extremity 2 days back without any fever or chills.  Patient was concerned about Mycobacterium infection since antibiotics were stopped a week and a half ago.  Patient had been compliant with Eliquis at home for atrial fibrillation. In the ED, patient had erythema redness tenderness over the right lower extremity with some edema and some weeping.  Labs notable hemoglobin of 8.5.  ED physician spoke with Dr. Elinor Parkinson, ID who did not feel mycobacterium was likely and recommended treatment of cellulitis.  Patient's family stated that he lives by himself at home and had concerns about him by himself.    Assessment and plan.  Right lower extremity cellulitis. Increasing redness swelling. History of Mycobacterium facial infection.  Of antibiotics for Mycobacterium recently.  No leukocytosis or fever but signs of infection.  On Eliquis for anticoagulation so unlikely DVT related.  Will treat the patient for possible bacterial cellulitis.  Received IV Rocephin in the ED will continue with that.  Will closely monitor.  Will demarcate the lesion.  History of coronary artery disease Paroxysmal atrial fibrillation Continue Eliquis, Coreg and Lipitor.  History of hyperlipidemia. Continue Lipitor.

## 2023-03-25 NOTE — ED Notes (Signed)
ED TO INPATIENT HANDOFF REPORT  ED Nurse Name and Phone #: Gustavo Lah, rn    S Name/Age/Gender [name] 82 y.o. male Room/Bed: 034C/034C  Code Status   Code Status: Prior  Home/SNF/Other Home Patient oriented to: self, place, and time Is this baseline? Yes   Triage Complete: Triage complete  Chief Complaint Cellulitis [L03.90]  Triage Note Right leg is red swollen and has two pustules to right leg.  Patients leg is weeping.    Allergies No Known Allergies  Level of Care/Admitting Diagnosis ED Disposition     ED Disposition  Admit   Condition  --   Comment  Hospital Area: [facility] [[id number]]  Level of Care: Med-Surg [16]  May place patient in observation at [facility] or [facility] if equivalent level of care is available:: No  Covid Evaluation: Asymptomatic - no recent exposure (last 10 days) testing not required  Diagnosis: Cellulitis [161096]  Admitting Physician: Joycelyn Das [[id number]]  Attending Physician: Joycelyn Das [[id number]]          B Medical/Surgery History Past Medical History:  Diagnosis Date   Ascending aorta dilatation (HCC)    44mm by chest CT angio 04/2018   CAD (coronary artery disease)    a. 05/2006 Abnl stress test w/ 2-57mm ST dep; b. 05/2006 Cath/PCI: LM nl, LAD 30-40ost, 20-30p, 90d (small), D1 nl, D2 nl, LCX nl, OM1/2 nl, RCA Ca2+, 44m/d (3.5x16 Liberty BMS & 3.5x12 Liberty BMS), EF 50%.   Chronic diastolic CHF (congestive heart failure) (HCC)    a. 10/2017 Echo: EF 50-55%, mild AI, sev dil LA, mod dil RA.   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 CHA2DS2VASc 5-->Eliquis; b. Prev WCT on flecainide;  c. 12/2017 s/p DCCV.   Pneumothorax    Prolonged Q-T interval on ECG 06/22/2022   Ventricular tachycardia (HCC)    a. possibly proarrhythmia from flecainide.   Past Surgical History:   Procedure Laterality Date   CARDIOVERSION N/A 12/23/2017   Procedure: CARDIOVERSION;  Surgeon: Quintella Reichert, MD;  Location[facility] ENDOSCOPY;  Service: Cardiovascular;  Laterality: N/A;   CARDIOVERSION N/A 03/08/2018   Procedure: CARDIOVERSION;  Surgeon: Quintella Reichert, MD;  Location: [facility] ENDOSCOPY;  Service: Cardiovascular;  Laterality: N/A;   INGUINAL HERNIA REPAIR     x2   left total hip surgery     TEE WITHOUT CARDIOVERSION N/A 03/08/2018   Procedure: TRANSESOPHAGEAL ECHOCARDIOGRAM (TEE);  Surgeon: Quintella Reichert, MD;  [facility] ENDOSCOPY;  Service: Cardiovascular;  Laterality: N/A;   TOTAL HIP ARTHROPLASTY Right 08/19/2020   Procedure: TOTAL HIP ARTHROPLASTY ANTERIOR APPROACH;  Surgeon: Ollen Gross, MD;  Location: [facility] ORS;  Service: Orthopedics;  Laterality: Right;    TOTAL KNEE ARTHROPLASTY Left 02/28/2018   Procedure: LEFT TOTAL KNEE ARTHROPLASTY;  Surgeon: Ollen Gross, MD;  Location: [facility] ORS;  Service: Orthopedics;  Laterality: Left;   VEIN LIGATION AND STRIPPING Left 04/08/2020   Procedure: LEFT LOWER EXTREMITY VEIN LIGATION AND EXCISION OF ULCER;  Surgeon: Chuck Hint, MD;  Location: [facility] OR;  Service: Vascular;  Laterality: Left;     A IV Location/Drains/Wounds Patient Lines/Drains/Airways Status     Active Line/Drains/Airways     Name Placement date Placement time Site Days   Peripheral IV 03/25/23 20 G Left;Posterior Wrist 03/25/23  1555  Wrist  less than 1  Intake/Output Last 24 hours No intake or output data in the 24 hours ending 03/25/23 1735  Labs/Imaging Results for orders placed or performed during the hospital encounter of 03/25/23 (from the past 48 hour(s))  Comprehensive metabolic panel     Status: Abnormal   Collection Time: 03/25/23  3:11 PM  Result Value Ref Range   Sodium 136 135 - 145 mmol/L   Potassium 4.0 3.5 - 5.1 mmol/L   Chloride 104 98 - 111 mmol/L   CO2 24 22 - 32 mmol/L   Glucose, Bld 98 70 - 99 mg/dL     Comment: Glucose reference range applies only to samples taken after fasting for at least 8 hours.   BUN 17 8 - 23 mg/dL   Creatinine, Ser 8.11 0.61 - 1.24 mg/dL   Calcium 8.3 (L) 8.9 - 10.3 mg/dL   Total Protein 5.5 (L) 6.5 - 8.1 g/dL   Albumin 2.7 (L) 3.5 - 5.0 g/dL   AST 34 15 - 41 U/L   ALT 29 0 - 44 U/L   Alkaline Phosphatase 57 38 - 126 U/L   Total Bilirubin 0.4 0.3 - 1.2 mg/dL   GFR, Estimated >91 >47 mL/min    Comment: (NOTE) Calculated using the CKD-EPI Creatinine Equation (2021)    Anion gap 8 5 - 15    Comment: Performed at [facility], [address], [place], [place] [address]  CBC with Differential     Status: Abnormal   Collection Time: 03/25/23  3:11 PM  Result Value Ref Range   WBC 8.6 4.0 - 10.5 K/uL   RBC 3.29 (L) 4.22 - 5.81 MIL/uL   Hemoglobin 8.5 (L) 13.0 - 17.0 g/dL   HCT 21.3 (L) 08.6 - 57.8 %   MCV 86.0 80.0 - 100.0 fL   MCH 25.8 (L) 26.0 - 34.0 pg   MCHC 30.0 30.0 - 36.0 g/dL   RDW 46.9 (H) 62.9 - 52.8 %   Platelets 238 150 - 400 K/uL   nRBC 0.0 0.0 - 0.2 %   Neutrophils Relative % 83 %   Neutro Abs 7.1 1.7 - 7.7 K/uL   Lymphocytes Relative 8 %   Lymphs Abs 0.7 0.7 - 4.0 K/uL   Monocytes Relative 8 %   Monocytes Absolute 0.7 0.1 - 1.0 K/uL   Eosinophils Relative 0 %   Eosinophils Absolute 0.0 0.0 - 0.5 K/uL   Basophils Relative 0 %   Basophils Absolute 0.0 0.0 - 0.1 K/uL   Immature Granulocytes 1 %   Abs Immature Granulocytes 0.04 0.00 - 0.07 K/uL    Comment: Performed at [facility], [address], [place], [place] [address]  I-Stat Lactic Acid, ED     Status: None   Collection Time: 03/25/23  3:17 PM  Result Value Ref Range   Lactic Acid, Venous 0.8 0.5 - 1.9 mmol/L  I-Stat Lactic Acid, ED     Status: None   Collection Time: 03/25/23  5:22 PM  Result Value Ref Range   Lactic Acid, Venous 0.6 0.5 - 1.9 mmol/L   DG Chest 2 View  Result Date: 03/25/2023 CLINICAL DATA:  Cellulitis EXAM: CHEST - 2 VIEW COMPARISON:  X-ray and CT  scan angiogram 02/28/2023 FINDINGS: Hyperinflation. Enlarged cardiopericardial silhouette. No consolidation, pneumothorax or effusion. No edema. Tortuous and ectatic aorta. Degenerative changes of the spine and shoulders. Chronic appearing deformity of the left AC joint. Apical pleural thickening. Eventration of the left hemidiaphragm. Curvature of the spine. IMPRESSION:  Hyperinflation with chronic changes.  Enlarged heart Electronically Signed   By: Karen Kays M.D.   On: 03/25/2023 16:37    Pending Labs Unresulted Labs (From admission, onward)     Start     Ordered   03/25/23 1605  Blood culture (routine x 2)  BLOOD CULTURE X 2,   R (with STAT occurrences)      03/25/23 1604   03/25/23 1505  Urinalysis, w/ Reflex to Culture (Infection Suspected) -Urine, Clean Catch  Once,   URGENT       Question:  Specimen Source  Answer:  Urine, Clean Catch   03/25/23 1505            Vitals/Pain Today's Vitals   03/25/23 1449 03/25/23 1456  BP: (!) 155/75   Pulse: 74   Resp: 16   Temp: (!) 97.5 F (36.4 C)   TempSrc: Oral   SpO2: 95%   Weight:  66.7 kg  Height:  6\' 2"  (1.88 m)  PainSc:  0-No pain    Isolation Precautions No active isolations  Medications Medications  cefTRIAXone (ROCEPHIN) 1 g in sodium chloride 0.9 % 100 mL IVPB (has no administration in time range)    Mobility walks with device     Focused Assessments Skin assessment ; Redness, cellulitis to right leg   R Recommendations: See Admitting Provider Note  Report given to:   Additional Notes:

## 2023-03-25 NOTE — Progress Notes (Signed)
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)  Prior CV Studies: Chest/aorta CTA 07/23/2021 Ascending thoracic aorta 4.5 cm   Myoview 07/22/2020 EF 64 apical inferior and apical infarct with peri-infarct ischemia; low risk (no change from 2010)   Echocardiogram 07/17/2020 EF 60-65, no RWMA, GR 1 DD, normal RVSF, mild BAE, mild MR, trivial AI, mild AV sclerosis without stenosis, SoV borderline dilation (41 mm)   Monitor 05/08/2019 Sinus bradycardia (average heart rate 55); A-fib with RVR with heart rate up to 164; occasional PVCs and one 5 beat run of WCT   Cardiac catheterization 06/04/2006 LM patent LAD 30-40 ostial, 20-30 proximal, 90 distal (small) LCx patent RCA mid to distal 90 EF 50 PCI: 3.5 x 16 mm Liberte BMS and 3.5 x 12 mm Liberte BMS to the RCA  History of Present Illness:   [name] is a 82 y.o. male with the above problem list.  He was last seen in May 2023 by Tereso Newcomer, PA after stopping his beta-blocker for lightheadedness and significant bradycardia.  At that time he still noted some lightheadedness at times.  Otherwise he was doing well except for continued issues with easy bleeding if he cut or nicked himself.  He is on chronic  anticoagulation for his A-fib.    He was hospitalized in August 2024 for possible seizure.  Apparently he was seen earlier in the day after he cut his foot and experienced significant blood loss and a hemoglobin of 8.2..  He was treated and discharged that same day but when he got home started feeling lightheaded.  He apparently was sitting at home at a computer and slumped over without loss of consciousness but then started had having a shaking sensation lasting 20 seconds.  It resolved and he got up to walk to the bathroom and felt weak and had an episode of fecal incontinence and is arm started shaking again.  This was followed by 15 minutes of confusion.  MRI and EEG of the brain were unremarkable and ultimately was felt not to have had a seizure and likely had orthostatic hypotension.    His daughter recently called in requesting that the Eliquis dose be dropped but given his normal renal function and weight he was on the appropriate dose of Eliquis 5 mg twice daily.  Because of his ongoing issues with easy bleeding when he would cut himself or bump up against something it was recommended that he be referred to EP for evaluation for the Watchman device.  The structural heart team clinic called to make an appointment and the patient declined setting up a consult for Watchman device.  He is here today for followup with his daughter.  She is very concerned about his right leg which over the past 2 days has become very red and swollen.  He had been  seen on Sunday by family and the leg was not red at all but over the past 3 days he has developed increased swelling to the point that his leg is weeping Fluid which was all over the floor of the office today.  He now has red streaks that extend all the way up past his knee and the lower extremity is very red.  He denies any fever or chills.  Of note he had been treated over the past year for a mycobacterial infection after a lesion was removed off his face.  He  just stopped that antibiotic recently.    He denies any chest pain or pressure, SOB, DOE, PND, orthopnea,  palpitations. He is compliant with his meds and is tolerating meds with no SE.    Past Medical History:  Diagnosis Date   Ascending aorta dilatation (HCC)    44mm by chest CT angio 04/2018   CAD (coronary artery disease)    a. 05/2006 Abnl stress test w/ 2-67mm ST dep; b. 05/2006 Cath/PCI: LM nl, LAD 30-40ost, 20-30p, 90d (small), D1 nl, D2 nl, LCX nl, OM1/2 nl, RCA Ca2+, 76m/d (3.5x16 Liberty BMS & 3.5x12 Liberty BMS), EF 50%.   Chronic diastolic CHF (congestive heart failure) (HCC)    a. 10/2017 Echo: EF 50-55%, mild AI, sev dil LA, mod dil RA.   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 CHA2DS2VASc 5-->Eliquis; b. Prev WCT on flecainide;  c. 12/2017 s/p DCCV.   Pneumothorax    Prolonged Q-T interval on ECG 06/22/2022   Ventricular tachycardia (HCC)    a. possibly proarrhythmia from flecainide.   Current Medications: Current Meds  Medication Sig   acetaminophen (TYLENOL) 325 MG tablet Take 2 tablets (650 mg total) by mouth every 6 (six) hours as needed for moderate pain.   apixaban (ELIQUIS) 2.5 MG TABS tablet Take 2.5 mg by mouth 2 (two) times daily.   atorvastatin (LIPITOR) 20 MG tablet TAKE 1 TABLET(20 MG) BY MOUTH DAILY   Boswellia Serrata (BOSWELLIA PO) Take 1,200 mg by mouth daily.   carvedilol (COREG) 3.125 MG tablet Take 3.125 mg by mouth daily.   lidocaine (LIDODERM) 5 % Place 1 patch onto the skin daily.   Misc Natural Products (GLUCOSAMINE CHONDROITIN TRIPLE) TABS Take 1 tablet by mouth daily.   Multiple Vitamin (MULTIVITAMIN WITH MINERALS) TABS tablet Take 1 tablet by mouth daily.   Omega-3 Fatty Acids (FISH OIL) 1200 MG CAPS Take 1,200 mg by mouth daily.   [DISCONTINUED] apixaban (ELIQUIS) 5 MG TABS tablet Take 1 tablet (5 mg total) by mouth 2 (two) times  daily. (Patient taking differently: Take 2.5 mg by mouth 2 (two) times daily.)   [DISCONTINUED] clofazimine 50 mg CAPS capsule (for compassionate use) Take 2 capsules (100 mg total) by mouth daily with breakfast.   [DISCONTINUED] Omadacycline Tosylate 150 MG TABS Take 2 tablets (300 mg total) by mouth daily.    Allergies:   Patient has no known allergies.   Social History   Tobacco Use   Smoking status: Never   Smokeless tobacco: Never  Vaping Use   Vaping status: Never Used  Substance Use Topics   Alcohol use: Not Currently    Comment: occsaoinal   Drug use: Not Currently    Types: Other-see comments    Family Hx: The patient's family history includes Coronary artery disease in an other family member.  Review  of Systems  Constitutional: Negative for fever.  Respiratory:  Negative for cough.   Gastrointestinal:  Negative for hematochezia.  Genitourinary:  Negative for hematuria.  See H&P for further review of systems  EKGs/Labs/Other Test Reviewed:    Recent Labs: 03/03/2023: B Natriuretic Peptide 356.4; BUN 20; Creatinine, Ser 1.16; Hemoglobin 9.4; Magnesium 1.8; Platelets 212; Potassium 4.1; Sodium 137   Recent Lipid Panel No results for input(s): "CHOL", "TRIG", "HDL", "VLDL", "LDLCALC", "LDLDIRECT" in the last 8760 hours.    Risk Assessment/Calculations:        Physical Exam:    VS:  BP (!) 140/90   Pulse 75   Ht 6\' 2"  (1.88 m)   Wt 147 lb 3.2 oz (66.8 kg)   SpO2 99%   BMI 18.90 kg/m     Wt Readings from Last 3 Encounters:  03/25/23 147 lb 3.2 oz (66.8 kg)  03/17/23 146 lb (66.2 kg)  03/01/23 154 lb (69.9 kg)  GEN: Well nourished, well developed in no acute distress HEENT: Normal NECK: No JVD; No carotid bruits LYMPHATICS: No lymphadenopathy CARDIAC:RRR, no murmurs, rubs, gallops RESPIRATORY:  Clear to auscultation without rales, wheezing or rhonchi  ABDOMEN: Soft, non-tender, non-distended MUSCULOSKELETAL: 1-2+ pitting lower extremity edema of the  right lower extremity which is erythematous with red streaks up past his knee very concerning for cellulitis.  It is weeping and fluid literally seeping out of his legs onto the floor SKIN: See under musculoskeletal NEUROLOGIC:  Alert and oriented x 3 PSYCHIATRIC:  Normal affect      ASSESSMENT & PLAN:    Near syncope -He has episodes of lightheadedness but has not had frank syncope.   -Some of his symptoms sound consistent with sinus congestion.  However, he has had recent bradycardia requiring stopping beta-blocker but remains on amiodarone for his PAF.   -Event monitor done 01/06/2022 showed an average heart rate 58 bpm with PVCs, ventricular salvos, ventricular couplets and an 8 beat run of nonsustained VT.  There were several runs of SVT up to 11 beats in a row with fastest heart rate 140 bpm.  There was a sinus pause up to 2.4 seconds. -He has had no syncopal events but did sustain a near syncopal episode recently after an ER visit after cutting his foot and bleeding and then went home and slumped in his chair while reading at the computer but did not pass out at that time.  He then got up and had some shaking movement of his arm followed by fecal incontinence and was taken to the ER.  EEG and MRI were negative.  Seen by neurology and ultimately felt not to be a seizure and likely a near syncopal episode -He has not had any further syncopal or near syncopal episodes.  Occasionally does have some orthostatic symptoms  (HFpEF) heart failure with preserved ejection fraction (HCC) -Overall, volume status is stable and does not appear volume overloaded on exam except lower extremity edema that appears to be related to cellulitis of his right lower extremity -His diuretics were stopped due to soft BPs and lightheadedness -He is on carvedilol 3.125 mg twice daily.  Coronary artery disease involving native coronary artery of native heart without angina pectoris -History of bare-metal stent to the  RCA in 2007 and nonischemic stress test in January 2022.   -He is not had any anginal symptoms since I saw him last -He is not on aspirin as he is on Apixaban.   -Continue prescription drug management with atorvastatin  20 mg daily with as needed refills    Essential hypertension -BP adequately controlled on exam -Continue prescription drug management with carvedilol 3.125 mg twice daily with as needed refills   Hyperlipidemia LDL goal <70 -LDL goal less than 70  -Continue prescription drug management with atorvastatin 20 mg daily with as needed refills  -I will get a copy of his last lipids from his PCP   Persistent atrial fibrillation (HCC) -He remains in normal sinus rhythm on exam today   -Continue prescription drug management with amiodarone 200 mg daily and carvedilol 3.125 mg twice daily -Check TSH and ALT today -Continue Eliquis 5 mg twice daily for now but will refer to EP for assessment for candidacy for Watchman device given his problems with chronic anemia and bleeding with minimal trauma to his body -I did tell his daughter that the structural heart team clinic had called to make an appointment which the patient refused so she will call the number to try to get that appointment scheduled  Thoracic aortic aneurysm Followed by Dr. Laneta Simmers.  Right lower extremity cellulitis -This appears to have come up in the past 3 days with marked erythema and swelling of the right lower extremity with red streaks going all the way up past his knee -I have recommended that he go to the emergency room today for admission for IV antibiotics.  I also recommended that they get lower extremity arterial your Dopplers while he is in the hospital to rule out peripheral arterial disease     Dispo: 6 months  Total time spent with patient today 45 minutes. This includes reviewing records, evaluating the patient and coordinating care. Face-to-face time >50%.  Medication Adjustments/Labs and Tests  Ordered: Current medicines are reviewed at length with the patient today.  Concerns regarding medicines are outlined above.  Tests Ordered: No orders of the defined types were placed in this encounter.  Medication Changes: No orders of the defined types were placed in this encounter.  Signed, Armanda Magic, MD  03/25/2023 1:59 PM    [facility] [address] [place], [place], [place]  [address] Phone: [phone]; Fax: [phone]

## 2023-03-25 NOTE — ED Provider Notes (Signed)
[place] EMERGENCY DEPARTMENT AT [facility] Provider Note   CSN: [id number] Arrival date & time: 03/25/23  1441     History  Chief Complaint  Patient presents with   Cellulitis    [name] is a 82 y.o. male.  82 year old male with history of atrial fibrillation on Eliquis and Mycobacterium infection of the face who presents to the emergency department with swelling and redness of the right lower extremity.  2 days ago started noticing redness of his right lower extremity as well.  No significant pain.  No fevers.  Is concerned that it could be related to the fact that his Mycobacterium antibiotics were stopped a week and a half ago.  Has been compliant with his Eliquis.       Home Medications Prior to Admission medications   Medication Sig Start Date End Date Taking? Authorizing Provider  acetaminophen (TYLENOL) 325 MG tablet Take 2 tablets (650 mg total) by mouth every 6 (six) hours as needed for moderate pain. 03/17/22  Yes Wallis Bamberg, PA-C  apixaban (ELIQUIS) 2.5 MG TABS tablet Take 1.25 mg by mouth 2 (two) times daily.   Yes [provider]  atorvastatin (LIPITOR) 20 MG tablet TAKE 1 TABLET(20 MG) BY MOUTH DAILY Patient taking differently: Take 20 mg by mouth daily. 03/10/23  Yes Turner, Cornelious Bryant, MD  Boswellia Serrata (BOSWELLIA PO) Take 1,200 mg by mouth daily.   Yes [provider]  carvedilol (COREG) 3.125 MG tablet Take 3.125 mg by mouth daily. 01/21/22  Yes [provider]  lidocaine (LIDODERM) 5 % Place 1 patch onto the skin daily.   Yes [provider]  Misc Natural Products (GLUCOSAMINE CHONDROITIN TRIPLE) TABS Take 1 tablet by mouth daily.   Yes [provider]  Multiple Vitamin (MULTIVITAMIN WITH MINERALS) TABS tablet Take 1 tablet by mouth daily.   Yes [provider]  Omega-3 Fatty Acids (FISH OIL) 1200 MG CAPS Take 1,200 mg by mouth daily.   Yes [provider]  furosemide (LASIX) 20  MG tablet Take 1 tablet (20 mg total) by mouth every other day. Patient not taking: Reported on 03/25/2023 12/11/22 03/11/23  Meriam Sprague, MD      Allergies    Patient has no known allergies.    Review of Systems   Review of Systems  Physical Exam Updated Vital Signs BP 135/88 (BP Location: Left Arm)   Pulse 77   Temp 98.3 F (36.8 C) (Oral)   Resp 18   Ht 6\' 2"  (1.88 m)   Wt 66.1 kg   SpO2 99%   BMI 18.71 kg/m  Physical Exam Vitals and nursing note reviewed.  Constitutional:      General: He is not in acute distress.    Appearance: He is well-developed.  HENT:     Head: Normocephalic and atraumatic.     Right Ear: External ear normal.     Left Ear: External ear normal.     Nose: Nose normal.  Eyes:     Extraocular Movements: Extraocular movements intact.     Conjunctiva/sclera: Conjunctivae normal.     Pupils: Pupils are equal, round, and reactive to light.  Pulmonary:     Effort: Pulmonary effort is normal. No respiratory distress.  Musculoskeletal:     Cervical back: Normal range of motion and neck supple.     Right lower leg: Edema present.     Left lower leg: Edema present.     Comments: No significant  tenderness palpation past the edges of the erythema.  Not significantly warm.  Dopplerable pulses bilaterally.  Skin:    General: Skin is warm and dry.  Neurological:     Mental Status: He is alert. Mental status is at baseline.  Psychiatric:        Mood and Affect: Mood normal.        Behavior: Behavior normal.     ED Results / Procedures / Treatments   Labs (all labs ordered are listed, but only abnormal results are displayed) Labs Reviewed  COMPREHENSIVE METABOLIC PANEL - Abnormal; Notable for the following components:      Result Value   Calcium 8.3 (*)    Total Protein 5.5 (*)    Albumin 2.7 (*)    All other components within normal limits  CBC WITH DIFFERENTIAL/PLATELET - Abnormal; Notable for the following components:   RBC 3.29 (*)     Hemoglobin 8.5 (*)    HCT 28.3 (*)    MCH 25.8 (*)    RDW 15.6 (*)    All other components within normal limits  HEMOGLOBIN A1C - Abnormal; Notable for the following components:   Hgb A1c MFr Bld 5.8 (*)    All other components within normal limits  BASIC METABOLIC PANEL - Abnormal; Notable for the following components:   Calcium 8.3 (*)    All other components within normal limits  CBC - Abnormal; Notable for the following components:   RBC 3.53 (*)    Hemoglobin 9.1 (*)    HCT 29.5 (*)    MCH 25.8 (*)    All other components within normal limits  CULTURE, BLOOD (ROUTINE X 2)  CULTURE, BLOOD (ROUTINE X 2)  URINALYSIS, W/ REFLEX TO CULTURE (INFECTION SUSPECTED)  I-STAT CG4 LACTIC ACID, ED  I-STAT CG4 LACTIC ACID, ED    EKG None  Radiology DG Chest 2 View  Result Date: 03/25/2023 CLINICAL DATA:  Cellulitis EXAM: CHEST - 2 VIEW COMPARISON:  X-ray and CT scan angiogram 02/28/2023 FINDINGS: Hyperinflation. Enlarged cardiopericardial silhouette. No consolidation, pneumothorax or effusion. No edema. Tortuous and ectatic aorta. Degenerative changes of the spine and shoulders. Chronic appearing deformity of the left AC joint. Apical pleural thickening. Eventration of the left hemidiaphragm. Curvature of the spine. IMPRESSION: Hyperinflation with chronic changes.  Enlarged heart Electronically Signed   By: Karen Kays M.D.   On: 03/25/2023 16:37    Procedures Procedures    Medications Ordered in ED Medications  acetaminophen (TYLENOL) tablet 650 mg (has no administration in time range)  atorvastatin (LIPITOR) tablet 20 mg (20 mg Oral Given 03/26/23 1009)  carvedilol (COREG) tablet 3.125 mg (3.125 mg Oral Given 03/26/23 1009)  furosemide (LASIX) tablet 20 mg (20 mg Oral Given 03/26/23 1009)  apixaban (ELIQUIS) tablet 2.5 mg (2.5 mg Oral Given 03/26/23 1009)  sodium chloride flush (NS) 0.9 % injection 3 mL (3 mLs Intravenous Given 03/26/23 1010)  sodium chloride flush (NS) 0.9 % injection 3 mL  (has no administration in time range)  0.9 %  sodium chloride infusion (has no administration in time range)  HYDROcodone-acetaminophen (NORCO/VICODIN) 5-325 MG per tablet 1 tablet (has no administration in time range)  docusate sodium (COLACE) capsule 100 mg (100 mg Oral Patient Refused/Not Given 03/26/23 1010)  cefTRIAXone (ROCEPHIN) 1 g in sodium chloride 0.9 % 100 mL IVPB (1 g Intravenous New Bag/Given 03/26/23 1019)  cefTRIAXone (ROCEPHIN) 1 g in sodium chloride 0.9 % 100 mL IVPB (0 g Intravenous Stopped 03/25/23 1823)  ED Course/ Medical Decision Making/ A&P Clinical Course as of 03/26/23 1137  Thu Mar 25, 2023  1635 Dr Elinor Parkinson does not feel that mycobacterium is not likely. Recommends fu with ID in a few days.  [RP]  1706 Dr Tyson Babinski from hospitalist to admit the patient [RP]    Clinical Course User Index [RP] Rondel Baton, MD                                 Medical Decision Making Amount and/or Complexity of Data Reviewed Labs: ordered. Radiology: ordered.  Risk Decision regarding hospitalization.   TOMIE BIRTCHER is a 82 y.o. male with comorbidities that complicate the patient evaluation including atrial fibrillation on Eliquis and Mycobacterium infection of the face who presents to the emergency department with swelling and redness of the right lower extremity   Initial Ddx:  Cellulitis, Mycobacterium infection, DVT, arterial insufficiency  MDM/Course:  Patient presents to the emergency department with redness and swelling of his right lower extremity.  No other infectious symptoms.  Did recently stop his antibiotics for Mycobacterium infection of his face.  Is reporting leg swelling but this appears to be bilateral which I suspect is from venous stasis or perhaps mild heart failure.  Did consider DVT but is already anticoagulated.  Had ABIs in the past few months and is not having significant pain of his right lower extremity so feel that acute limb ischemia is  unlikely.  Also has dopplerable pulses in both of his legs today.  Upon re-evaluation discussed discharge with the patient's family since he is not septic and his lab work is unremarkable.  I did give him some ceftriaxone in the emergency department.  Family is concerned about the progression of the rash and requested that he be admitted for observation.  Discussed with hospitalist who will observe him.  Also discussed with infectious disease who feels that Mycobacterium is unlikely and to treat him as a typical community-acquired cellulitis and have him follow-up with them in clinic.  This patient presents to the ED for concern of complaints listed in HPI, this involves an extensive number of treatment options, and is a complaint that carries with it a high risk of complications and morbidity. Disposition including potential need for admission considered.   Dispo: Admit to Floor  Additional history obtained from daughter Records reviewed Outpatient Clinic Notes The following labs were independently interpreted: Chemistry and show no acute abnormality I have reviewed the patients home medications and made adjustments as needed Consults: Hospitalist and infectious disease Social Determinants of health:  Elderly         Final Clinical Impression(s) / ED Diagnoses Final diagnoses:  Cellulitis of right leg    Rx / DC Orders ED Discharge Orders     None         Rondel Baton, MD 03/26/23 1137

## 2023-03-25 NOTE — Patient Instructions (Addendum)
Medication Instructions:  Your physician recommends that you continue on your current medications as directed. Please refer to the Current Medication list given to you today.  *If you need a refill on your cardiac medications before your next appointment, please call your pharmacy*   Lab Work: None. If you have labs (blood work) drawn today and your tests are completely normal, you will receive your results only by: MyChart Message (if you have MyChart) OR A paper copy in the mail If you have any lab test that is abnormal or we need to change your treatment, we will call you to review the results.   Testing/Procedures: None.   Follow-Up:    Your next appointment will be dependent upon discharge from the ED.    You will also need to see Dr. Armanda Magic in 6 months.   Other Instructions You have been referred to the ER for cellulitis. Please have your loved one drive you to the ER as soon as possible.  You have been referred to Dr. Lalla Brothers to see if you qualify for a Watchman Device.

## 2023-03-25 NOTE — H&P (Addendum)
[facility] History and Physical  [name] ZOX:[id number] DOB: [birth year] DOA: 03/25/2023  Referring physician: ED  PCP: Renford Dills, MD   Patient is coming from: home   Chief Complaint: Home  HPI:  Patient is 82 years old male with past medical history of coronary artery disease, hypertension, atrial fibrillation and hyperlipidemia with history of Mycobacterium infection of the face presented to the hospital with swelling and redness of the right lower extremity 2 days back without any fever or chills.  Patient was concerned about Mycobacterium infection since antibiotics were stopped a week and a half ago.  Patient had been compliant with Eliquis at home for atrial fibrillation.  Patient denies any nausea, vomiting, fever, chills or rigor.  Denies any urinary urgency, frequency or dysuria.  At baseline patient is independent.  Denies any recent travel or sick contacts.  Denies any shortness of breath, chest pain, dizziness, lightheadedness.  In the ED, patient had erythema, redness tenderness over the right lower extremity with some edema and some weeping.  Labs notable hemoglobin of 8.5.  ED physician spoke with Dr. Elinor Parkinson, ID who did not feel mycobacterium was likely and recommended treatment of cellulitis.  Patient's family stated that he lives by himself at home and had concerns about him by himself.  Patient was then admitted hospital for further evaluation and treatment.  Assessment and plan.  Right lower extremity cellulitis. Increasing redness swelling. History of Mycobacterium facial infection.  Of antibiotics for Mycobacterium recently.  ID was consulted from the ED who recommended treatment for regular cellulitis and no concern for Mycobacterium infection.  No leukocytosis or fever. On Eliquis for anticoagulation so unlikely DVT related.  Will treat the patient for possible bacterial cellulitis.  Received IV Rocephin in the ED will continue with that.  Will closely  monitor.  Will demarcate the lesion.  Will get ABI of the lower extremities to rule out peripheral arterial disease.  History of coronary artery disease Paroxysmal atrial fibrillation Continue Eliquis, Coreg and Lipitor.  History of hyperlipidemia. Continue Lipitor.  DVT Prophylaxis: Eliquis  Review of Systems:  All systems were reviewed and were negative unless otherwise mentioned in the HPI   Past Medical History:  Diagnosis Date   Ascending aorta dilatation (HCC)    44mm by chest CT angio 04/2018   CAD (coronary artery disease)    a. 05/2006 Abnl stress test w/ 2-16mm ST dep; b. 05/2006 Cath/PCI: LM nl, LAD 30-40ost, 20-30p, 90d (small), D1 nl, D2 nl, LCX nl, OM1/2 nl, RCA Ca2+, 107m/d (3.5x16 Liberty BMS & 3.5x12 Liberty BMS), EF 50%.   Chronic diastolic CHF (congestive heart failure) (HCC)    a. 10/2017 Echo: EF 50-55%, mild AI, sev dil LA, mod dil RA.   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 CHA2DS2VASc 5-->Eliquis; b. Prev WCT on flecainide;  c. 12/2017 s/p DCCV.   Pneumothorax    Prolonged Q-T interval on ECG 06/22/2022   Ventricular tachycardia (HCC)    a. possibly proarrhythmia from flecainide.   Past Surgical History:  Procedure Laterality Date   CARDIOVERSION N/A 12/23/2017   Procedure: CARDIOVERSION;  Surgeon: Quintella Reichert, MD;  Location: [facility] ENDOSCOPY;  Service: Cardiovascular;  Laterality: N/A;   CARDIOVERSION N/A 03/08/2018   Procedure: CARDIOVERSION;  Surgeon: Quintella Reichert, MD;  Location: [facility] ENDOSCOPY;  Service: Cardiovascular;  Laterality: N/A;   INGUINAL HERNIA REPAIR  x2   left total hip surgery     TEE WITHOUT CARDIOVERSION N/A 03/08/2018   Procedure: TRANSESOPHAGEAL ECHOCARDIOGRAM (TEE);  Surgeon: Quintella Reichert, MD;  Location: [facility] ENDOSCOPY;  Service: Cardiovascular;  Laterality: N/A;   TOTAL HIP ARTHROPLASTY Right 08/19/2020   Procedure:  TOTAL HIP ARTHROPLASTY ANTERIOR APPROACH;  Surgeon: Ollen Gross, MD;  Location: [facility] ORS;  Service: Orthopedics;  Laterality: Right;    TOTAL KNEE ARTHROPLASTY Left 02/28/2018   Procedure: LEFT TOTAL KNEE ARTHROPLASTY;  Surgeon: Ollen Gross, MD;  Location: [facility] ORS;  Service: Orthopedics;  Laterality: Left;   VEIN LIGATION AND STRIPPING Left 04/08/2020   Procedure: LEFT LOWER EXTREMITY VEIN LIGATION AND EXCISION OF ULCER;  Surgeon: Chuck Hint, MD;  Location: [facility] OR;  Service: Vascular;  Laterality: Left;    Social History:  reports that he has never smoked. He has never used smokeless tobacco. He reports that he does not currently use alcohol. He reports that he does not currently use drugs after having used the following drugs: Other-see comments.  No Known Allergies  Family History  Problem Relation Age of Onset   Coronary artery disease Other      Prior to Admission medications   Medication Sig Start Date End Date Taking? Authorizing Provider  acetaminophen (TYLENOL) 325 MG tablet Take 2 tablets (650 mg total) by mouth every 6 (six) hours as needed for moderate pain. 03/17/22   Wallis Bamberg, PA-C  apixaban (ELIQUIS) 2.5 MG TABS tablet Take 2.5 mg by mouth 2 (two) times daily.    [provider]  atorvastatin (LIPITOR) 20 MG tablet TAKE 1 TABLET(20 MG) BY MOUTH DAILY 03/10/23   Quintella Reichert, MD  Boswellia Serrata (BOSWELLIA PO) Take 1,200 mg by mouth daily.    [provider]  carvedilol (COREG) 3.125 MG tablet Take 3.125 mg by mouth daily. 01/21/22   [provider]  furosemide (LASIX) 20 MG tablet Take 1 tablet (20 mg total) by mouth every other day. 12/11/22 03/11/23  Meriam Sprague, MD  lidocaine (LIDODERM) 5 % Place 1 patch onto the skin daily.    [provider]  Misc Natural Products (GLUCOSAMINE CHONDROITIN TRIPLE) TABS Take 1 tablet by mouth daily.    [provider]  Multiple Vitamin (MULTIVITAMIN WITH MINERALS)  TABS tablet Take 1 tablet by mouth daily.    [provider]  Omega-3 Fatty Acids (FISH OIL) 1200 MG CAPS Take 1,200 mg by mouth daily.    [provider]    Physical Exam: Vitals:   03/25/23 1449 03/25/23 1456  BP: (!) 155/75   Pulse: 74   Resp: 16   Temp: (!) 97.5 F (36.4 C)   TempSrc: Oral   SpO2: 95%   Weight:  66.7 kg  Height:  6\' 2"  (1.88 m)   Wt Readings from Last 3 Encounters:  03/25/23 66.7 kg  03/25/23 66.8 kg  03/17/23 66.2 kg   Body mass index is 18.87 kg/m.  General:  Average built, not in obvious distress, thinly built HENT: Normocephalic, No scleral pallor or icterus noted. Oral mucosa is moist.  Chest:  Clear breath sounds.  . No crackles or wheezes.  CVS: S1 &S2 heard. No murmur.  Regular rate and rhythm. Abdomen: Soft, nontender, nondistended.  Bowel sounds are heard. No abdominal mass palpated Extremities: No cyanosis, clubbing or edema.  Peripheral pulses are palpable. Psych: Alert, awake and oriented, normal mood CNS:  No cranial nerve deficits.  Power equal in all extremities.  Skin: Warm and dry, right leg with erythema, edema On presentation pic below   Labs on Admission:   CBC: Recent Labs  Lab 03/25/23 1511  WBC 8.6  NEUTROABS 7.1  HGB 8.5*  HCT 28.3*  MCV 86.0  PLT 238    Basic Metabolic Panel: Recent Labs  Lab 03/25/23 1511  NA 136  K 4.0  CL 104  CO2 24  GLUCOSE 98  BUN 17  CREATININE 1.11  CALCIUM 8.3*    Liver Function Tests: Recent Labs  Lab 03/25/23 1511  AST 34  ALT 29  ALKPHOS 57  BILITOT 0.4  PROT 5.5*  ALBUMIN 2.7*   No results for input(s): "LIPASE", "AMYLASE" in the last 168 hours. No results for input(s): "AMMONIA" in the last 168 hours.  Cardiac Enzymes: No results for input(s): "CKTOTAL", "CKMB", "CKMBINDEX", "TROPONINI" in the last 168 hours.  BNP (last 3 results) Recent Labs    02/28/23 2013 03/02/23 0639 03/03/23 0024  BNP 445.7* 514.5* 356.4*    ProBNP (last 3  results) No results for input(s): "PROBNP" in the last 8760 hours.  CBG: No results for input(s): "GLUCAP" in the last 168 hours.  Lipase  No results found for: "LIPASE"   Urinalysis    Component Value Date/Time   COLORURINE YELLOW 02/21/2018 0947   APPEARANCEUR CLEAR 02/21/2018 0947   LABSPEC 1.010 02/21/2018 0947   PHURINE 5.0 02/21/2018 0947   GLUCOSEU NEGATIVE 02/21/2018 0947   HGBUR NEGATIVE 02/21/2018 0947   BILIRUBINUR NEGATIVE 02/21/2018 0947   KETONESUR NEGATIVE 02/21/2018 0947   PROTEINUR NEGATIVE 02/21/2018 0947   UROBILINOGEN 0.2 07/09/2009 0918   NITRITE NEGATIVE 02/21/2018 0947   LEUKOCYTESUR TRACE (A) 02/21/2018 0947     Drugs of Abuse  No results found for: "LABOPIA", "COCAINSCRNUR", "LABBENZ", "AMPHETMU", "THCU", "LABBARB"    Radiological Exams on Admission: DG Chest 2 View  Result Date: 03/25/2023 CLINICAL DATA:  Cellulitis EXAM: CHEST - 2 VIEW COMPARISON:  X-ray and CT scan angiogram 02/28/2023 FINDINGS: Hyperinflation. Enlarged cardiopericardial silhouette. No consolidation, pneumothorax or effusion. No edema. Tortuous and ectatic aorta. Degenerative changes of the spine and shoulders. Chronic appearing deformity of the left AC joint. Apical pleural thickening. Eventration of the left hemidiaphragm. Curvature of the spine. IMPRESSION: Hyperinflation with chronic changes.  Enlarged heart Electronically Signed   By: Karen Kays M.D.   On: 03/25/2023 16:37    EKG: Not available for review   Consultant: Verbal consult with ID  Code Status: Full code  Microbiology blood culture sent from the ED  Antibiotics: IV Rocephin  Family Communication:  Patients' condition and plan of care including tests being ordered have been discussed with the patient and the patient's daughter at bedside who indicate understanding and agree with the plan.   Status is: Observation   Severity of Illness:  The appropriate patient status for this patient is OBSERVATION.  Observation status is judged to be reasonable and necessary in order to provide the required intensity of service to ensure the patient's safety. The patient's presenting symptoms, physical exam findings, and initial radiographic and laboratory data in the context of their medical condition is felt to place them at decreased risk for further clinical deterioration. Furthermore, it is anticipated that the patient will be medically stable for discharge from the hospital within 2 midnights of admission.   Signed, Joycelyn Das, MD [facility] 03/25/2023

## 2023-03-26 ENCOUNTER — Observation Stay (HOSPITAL_BASED_OUTPATIENT_CLINIC_OR_DEPARTMENT_OTHER): Payer: Medicare HMO

## 2023-03-26 DIAGNOSIS — I2510 Atherosclerotic heart disease of native coronary artery without angina pectoris: Secondary | ICD-10-CM | POA: Diagnosis not present

## 2023-03-26 DIAGNOSIS — I10 Essential (primary) hypertension: Secondary | ICD-10-CM | POA: Diagnosis not present

## 2023-03-26 DIAGNOSIS — I5032 Chronic diastolic (congestive) heart failure: Secondary | ICD-10-CM | POA: Diagnosis not present

## 2023-03-26 DIAGNOSIS — L03115 Cellulitis of right lower limb: Secondary | ICD-10-CM | POA: Diagnosis not present

## 2023-03-26 DIAGNOSIS — I7090 Unspecified atherosclerosis: Secondary | ICD-10-CM | POA: Diagnosis not present

## 2023-03-26 DIAGNOSIS — I480 Paroxysmal atrial fibrillation: Secondary | ICD-10-CM | POA: Diagnosis not present

## 2023-03-26 LAB — CBC
HCT: 29.5 % — ABNORMAL LOW (ref 39.0–52.0)
Hemoglobin: 9.1 g/dL — ABNORMAL LOW (ref 13.0–17.0)
MCH: 25.8 pg — ABNORMAL LOW (ref 26.0–34.0)
MCHC: 30.8 g/dL (ref 30.0–36.0)
MCV: 83.6 fL (ref 80.0–100.0)
Platelets: 296 10*3/uL (ref 150–400)
RBC: 3.53 MIL/uL — ABNORMAL LOW (ref 4.22–5.81)
RDW: 15.5 % (ref 11.5–15.5)
WBC: 5.9 10*3/uL (ref 4.0–10.5)
nRBC: 0 % (ref 0.0–0.2)

## 2023-03-26 LAB — BASIC METABOLIC PANEL
Anion gap: 7 (ref 5–15)
BUN: 15 mg/dL (ref 8–23)
CO2: 26 mmol/L (ref 22–32)
Calcium: 8.3 mg/dL — ABNORMAL LOW (ref 8.9–10.3)
Chloride: 105 mmol/L (ref 98–111)
Creatinine, Ser: 1.13 mg/dL (ref 0.61–1.24)
GFR, Estimated: >60 mL/min (ref >60)
Glucose, Bld: 90 mg/dL (ref 70–99)
Potassium: 4.3 mmol/L (ref 3.5–5.1)
Sodium: 138 mmol/L (ref 135–145)

## 2023-03-26 MED ORDER — FUROSEMIDE 20 MG PO TABS: 20.0000 mg | ORAL_TABLET | Freq: Every day | ORAL | Status: DC

## 2023-03-26 MED ORDER — AMOXICILLIN-POT CLAVULANATE 875-125 MG PO TABS
1.0000 | ORAL_TABLET | Freq: Two times a day (BID) | ORAL | 0 refills | Status: AC
Start: 2023-03-26 — End: 2023-03-31

## 2023-03-26 MED ORDER — APIXABAN 2.5 MG PO TABS: 2.5000 mg | ORAL_TABLET | Freq: Two times a day (BID) | ORAL | Status: DC

## 2023-03-26 NOTE — Discharge Summary (Signed)
Physician Discharge Summary  [name] YNW:[id number] DOB: [birth year] DOA: 03/25/2023  PCP: Renford Dills, MD  Admit date: 03/25/2023 Discharge date: 03/26/2023  Admitted From: Home  Discharge disposition: Home   Recommendations for Outpatient Follow-Up:   Follow up with your primary care provider in one week.  Check CBC, BMP, magnesium in the next visit  Discharge Diagnosis:   Principal Problem:   Cellulitis Active Problems:   HTN (hypertension)   CAD (coronary artery disease)   Chronic diastolic CHF (congestive heart failure) (HCC)   PAF (paroxysmal atrial fibrillation) (HCC)   Discharge Condition: Improved.  Diet recommendation: Low sodium, heart healthy.   Wound care: None.  Code status: Full.   History of Present Illness:   Patient is 82 years old male with past medical history of coronary artery disease, hypertension, atrial fibrillation and hyperlipidemia with history of Mycobacterium infection of the face presented to the hospital with swelling and redness of the right lower extremity 2 days back without any fever or chills.  Patient was concerned about Mycobacterium infection since antibiotics were stopped a week and a half ago.  Patient had been compliant with Eliquis at home for atrial fibrillation.  Patient denies any nausea, vomiting, fever, chills or rigor.  Denies any urinary urgency, frequency or dysuria.  At baseline patient is independent.  Denies any recent travel or sick contacts.  Denies any shortness of breath, chest pain, dizziness, lightheadedness.  In the ED, patient had erythema, redness tenderness over the right lower extremity with some edema and some weeping.  Labs notable hemoglobin of 8.5.  ED physician spoke with Dr. Elinor Parkinson, ID who did not feel mycobacterium was likely and recommended treatment of cellulitis.  Patient's family stated that he lives by himself at home and had concerns about him by himself.  Patient was then admitted  hospital for further evaluation and treatment.   Hospital Course:   Following conditions were addressed during hospitalization as listed below,  Right lower extremity cellulitis.  ID was consulted from the ED who recommended treatment for regular cellulitis and no concern for Mycobacterium infection.  No leukocytosis or fever. On Eliquis for anticoagulation so unlikely DVT related.   Received IV Rocephin with improvement in erythema and tenderness.  Preliminary ABI of the lower extremities within normal bilateral toe brachial index with incompressible arteries.  Spoke with the patient's daughter about it and at this time recommend elevation of the legs, compression stockings, oral antibiotic and continued use of diuretic at home.  Will change antibiotics to Augmentin on discharge for next 5 days to complete total 7-day course.   History of coronary artery disease Paroxysmal atrial fibrillation Continue Eliquis, Coreg and Lipitor.  On low-dose Lasix at home.   History of hyperlipidemia. Continue Lipitor.  Disposition.  At this time, patient is stable for disposition home with outpatient PCP follow-up.  Spoke with the patient's daughter Ms. Stark Klein on the phone and updated her about the clinical condition of the patient.  Medical Consultants:   None.  Procedures:    None Subjective:   Today, patient was seen and examined at bedside.  Feels better.  Walking on the room.  Denies any fever, chills or rigor.  Discharge Exam:   Vitals:   03/26/23 0425 03/26/23 1327  BP: 135/88 (!) 137/58  Pulse: 77 63  Resp: 18 16  Temp: 98.3 F (36.8 C) 97.8 F (36.6 C)  SpO2: 99% 98%   Vitals:   03/25/23 2327 03/26/23 0425 03/26/23 0427 03/26/23  1327  BP: 137/75 135/88  (!) 137/58  Pulse: 67 77  63  Resp: 18 18  16   Temp: 98 F (36.7 C) 98.3 F (36.8 C)  97.8 F (36.6 C)  TempSrc: Oral Oral  Oral  SpO2: 100% 99%  98%  Weight:   66.1 kg   Height:       Body mass index is 18.71 kg/m.    General: Alert awake, not in obvious distress, thinly built, HENT: pupils equally reacting to light,  No scleral pallor or icterus noted. Oral mucosa is moist.  Chest:    Diminished breath sounds bilaterally. No crackles or wheezes.  CVS: S1 &S2 heard. No murmur.  Regular rate and rhythm. Abdomen: Soft, nontender, nondistended.  Bowel sounds are heard.   Extremities: No cyanosis, clubbing with pitting edema bilaterally, chronic venous insufficiency studies of the left leg, right leg with erythema edema with local rise in temperature and mild tenderness on palpation but improved compared to yesterday.  Peripheral pulses are palpable. Psych: Alert, awake and oriented, normal mood CNS:  No cranial nerve deficits.  Power equal in all extremities.   Skin: Warm and dry.  Right lower extremity with cellulitis.  The results of significant diagnostics from this hospitalization (including imaging, microbiology, ancillary and laboratory) are listed below for reference.     Diagnostic Studies:   VAS [place] ABI WITH/WO TBI  Result Date: 03/26/2023  LOWER EXTREMITY DOPPLER STUDY Patient Name:  [name]  Date of Exam:   03/26/2023 Medical Rec #: [id number]         Accession #:    [id number] Date of Birth: 1941-02-21        Patient Gender: M Patient Age:   17 years Exam Location:  [facility] Procedure:      VAS [place] ABI WITH/WO TBI Referring Phys: Rebekah Chesterfield Shine Mikes --------------------------------------------------------------------------------  Indications: "PAD" High Risk Factors: Hypertension, hyperlipidemia, no history of smoking, coronary                    artery disease. Other Factors: Afib, CHF, chronic venous insufficiency.  Comparison Study: No previous exams Performing Technologist: Hill, Jody RVT, RDMS  Examination Guidelines: A complete evaluation includes at minimum, Doppler waveform signals and systolic blood pressure reading at the level of bilateral brachial, anterior tibial, and posterior  tibial arteries, when vessel segments are accessible. Bilateral testing is considered an integral part of a complete examination. Photoelectric Plethysmograph (PPG) waveforms and toe systolic pressure readings are included as required and additional duplex testing as needed. Limited examinations for reoccurring indications may be performed as noted.  ABI Findings: +---------+------------------+-----+---------+---------------+ Right    Rt Pressure (mmHg)IndexWaveform Comment         +---------+------------------+-----+---------+---------------+ Brachial 129                    triphasic                +---------+------------------+-----+---------+---------------+ PTA                             triphasicnoncompressible +---------+------------------+-----+---------+---------------+ DP                              biphasic noncompressible +---------+------------------+-----+---------+---------------+ Great Toe120               0.91 Normal                   +---------+------------------+-----+---------+---------------+ +---------+------------------+-----+---------+---------------+  Left     Lt Pressure (mmHg)IndexWaveform Comment         +---------+------------------+-----+---------+---------------+ Brachial 132                                             +---------+------------------+-----+---------+---------------+ PTA                             triphasicnoncompressible +---------+------------------+-----+---------+---------------+ DP                              triphasicnoncompressible +---------+------------------+-----+---------+---------------+ Great Toe161               1.22                          +---------+------------------+-----+---------+---------------+ +-------+-----------+-----------+------------+------------+ ABI/TBIToday's ABIToday's TBIPrevious ABIPrevious TBI +-------+-----------+-----------+------------+------------+ Right  [place]          0.91                                +-------+-----------+-----------+------------+------------+ Left            1.22                                +-------+-----------+-----------+------------+------------+  Arterial wall calcification precludes accurate ankle pressures and ABIs.  Summary: Right: Resting right ankle-brachial index indicates noncompressible right lower extremity arteries. The right toe-brachial index is normal. Left: Resting left ankle-brachial index indicates noncompressible left lower extremity arteries. The left toe-brachial index is normal. *See table(s) above for measurements and observations.     Preliminary    DG Chest 2 View  Result Date: 03/25/2023 CLINICAL DATA:  Cellulitis EXAM: CHEST - 2 VIEW COMPARISON:  X-ray and CT scan angiogram 02/28/2023 FINDINGS: Hyperinflation. Enlarged cardiopericardial silhouette. No consolidation, pneumothorax or effusion. No edema. Tortuous and ectatic aorta. Degenerative changes of the spine and shoulders. Chronic appearing deformity of the left AC joint. Apical pleural thickening. Eventration of the left hemidiaphragm. Curvature of the spine. IMPRESSION: Hyperinflation with chronic changes.  Enlarged heart Electronically Signed   By: Karen Kays M.D.   On: 03/25/2023 16:37     Labs:   Basic Metabolic Panel: Recent Labs  Lab 03/25/23 1511 03/26/23 0936  NA 136 138  K 4.0 4.3  CL 104 105  CO2 24 26  GLUCOSE 98 90  BUN 17 15  CREATININE 1.11 1.13  CALCIUM 8.3* 8.3*   GFR Estimated Creatinine Clearance: 46.3 mL/min (by C-G formula based on SCr of 1.13 mg/dL). Liver Function Tests: Recent Labs  Lab 03/25/23 1511  AST 34  ALT 29  ALKPHOS 57  BILITOT 0.4  PROT 5.5*  ALBUMIN 2.7*   No results for input(s): "LIPASE", "AMYLASE" in the last 168 hours. No results for input(s): "AMMONIA" in the last 168 hours. Coagulation profile No results for input(s): "INR", "PROTIME" in the last 168 hours.  CBC: Recent Labs   Lab 03/25/23 1511 03/26/23 0936  WBC 8.6 5.9  NEUTROABS 7.1  --   HGB 8.5* 9.1*  HCT 28.3* 29.5*  MCV 86.0 83.6  PLT 238 296   Cardiac Enzymes: No results for input(s): "CKTOTAL", "CKMB", "CKMBINDEX", "TROPONINI" in the  last 168 hours. BNP: Invalid input(s): "POCBNP" CBG: No results for input(s): "GLUCAP" in the last 168 hours. D-Dimer No results for input(s): "DDIMER" in the last 72 hours. Hgb A1c Recent Labs    03/25/23 1511  HGBA1C 5.8*   Lipid Profile No results for input(s): "CHOL", "HDL", "LDLCALC", "TRIG", "CHOLHDL", "LDLDIRECT" in the last 72 hours. Thyroid function studies No results for input(s): "TSH", "T4TOTAL", "T3FREE", "THYROIDAB" in the last 72 hours.  Invalid input(s): "FREET3" Anemia work up No results for input(s): "VITAMINB12", "FOLATE", "FERRITIN", "TIBC", "IRON", "RETICCTPCT" in the last 72 hours. Microbiology Recent Results (from the past 240 hour(s))  Blood culture (routine x 2)     Status: None (Preliminary result)   Collection Time: 03/25/23  5:05 PM   Specimen: BLOOD  Result Value Ref Range Status   Specimen Description BLOOD RIGHT ANTECUBITAL  Final   Special Requests   Final    BOTTLES DRAWN AEROBIC AND ANAEROBIC Blood Culture adequate volume   Culture   Final    NO GROWTH < 24 HOURS Performed at [facility], [address], [place], [place] [address]    Report Status PENDING  Incomplete  Blood culture (routine x 2)     Status: None (Preliminary result)   Collection Time: 03/25/23  5:14 PM   Specimen: BLOOD  Result Value Ref Range Status   Specimen Description BLOOD LEFT ANTECUBITAL  Final   Special Requests   Final    BOTTLES DRAWN AEROBIC ONLY Blood Culture results may not be optimal due to an inadequate volume of blood received in culture bottles   Culture   Final    NO GROWTH < 24 HOURS Performed at [facility], [address], [place], [place] [address]    Report Status PENDING  Incomplete     Discharge  Instructions:   Discharge Instructions     Call MD for:  severe uncontrolled pain   Complete by: As directed    Call MD for:  temperature >100.4   Complete by: As directed    Diet - low sodium heart healthy   Complete by: As directed    Discharge instructions   Complete by: As directed    Please keep the affected extremity elevated while lying down or sitting.  If possible wear compression stockings.  Continue diuretic daily until otherwise by your primary care physician.  Complete the course of antibiotic for next 5 days.  Seek medical attention for worsening symptoms.  Check blood work as outpatient.   Increase activity slowly   Complete by: As directed       Allergies as of 03/26/2023   No Known Allergies      Medication List     TAKE these medications    acetaminophen 325 MG tablet Commonly known as: Tylenol Take 2 tablets (650 mg total) by mouth every 6 (six) hours as needed for moderate pain.   amoxicillin-clavulanate 875-125 MG tablet Commonly known as: AUGMENTIN Take 1 tablet by mouth 2 (two) times daily for 5 days.   apixaban 2.5 MG Tabs tablet Commonly known as: ELIQUIS Take 1 tablet (2.5 mg total) by mouth 2 (two) times daily. What changed: how much to take   atorvastatin 20 MG tablet Commonly known as: LIPITOR TAKE 1 TABLET(20 MG) BY MOUTH DAILY What changed: See the new instructions.   BOSWELLIA PO Take 1,200 mg by mouth daily.   carvedilol 3.125 MG tablet Commonly known as: COREG Take 3.125 mg by mouth daily.  Fish Oil 1200 MG Caps Take 1,200 mg by mouth daily.   furosemide 20 MG tablet Commonly known as: LASIX Take 1 tablet (20 mg total) by mouth daily. What changed: when to take this   Glucosamine Chondroitin Triple Tabs Take 1 tablet by mouth daily.   lidocaine 5 % Commonly known as: LIDODERM Place 1 patch onto the skin daily.   multivitamin with minerals Tabs tablet Take 1 tablet by mouth daily.        Follow-up Information      Renford Dills, MD Follow up in 1 week(s).   Specialty: Internal Medicine Contact information: [address] [place] [address] [phone]                  Time coordinating discharge: 39 minutes  Signed:  Abbeygail Igoe  [facility] 03/26/2023, 1:59 PM

## 2023-03-26 NOTE — Plan of Care (Signed)
  Problem: Education: Goal: Knowledge of General Education information will improve Description: Including pain rating scale, medication(s)/side effects and non-pharmacologic comfort measures Outcome: Adequate for Discharge   Problem: Health Behavior/Discharge Planning: Goal: Ability to manage health-related needs will improve Outcome: Adequate for Discharge   Problem: Clinical Measurements: Goal: Ability to maintain clinical measurements within normal limits will improve Outcome: Adequate for Discharge Goal: Will remain free from infection Outcome: Adequate for Discharge Goal: Diagnostic test results will improve Outcome: Adequate for Discharge Goal: Respiratory complications will improve Outcome: Adequate for Discharge Goal: Cardiovascular complication will be avoided Outcome: Adequate for Discharge   Problem: Activity: Goal: Risk for activity intolerance will decrease Outcome: Adequate for Discharge   Problem: Nutrition: Goal: Adequate nutrition will be maintained Outcome: Adequate for Discharge   Problem: Elimination: Goal: Will not experience complications related to bowel motility Outcome: Adequate for Discharge Goal: Will not experience complications related to urinary retention Outcome: Adequate for Discharge   Problem: Coping: Goal: Level of anxiety will decrease Outcome: Adequate for Discharge   Problem: Pain Managment: Goal: General experience of comfort will improve Outcome: Adequate for Discharge   Problem: Safety: Goal: Ability to remain free from injury will improve Outcome: Adequate for Discharge   Problem: Skin Integrity: Goal: Risk for impaired skin integrity will decrease Outcome: Adequate for Discharge

## 2023-03-26 NOTE — Progress Notes (Signed)
[name] to be D/C'd Home per MD order.  Discussed with the patient and all questions fully answered.  IV catheter discontinued intact. Site without signs and symptoms of complications. Dressing and pressure applied.  An After Visit Summary was printed and given to the patient. Patient prescription sent to his pharmacy.  D/c education completed with patient/family including follow up instructions, medication list, d/c activities limitations if indicated, with other d/c instructions as indicated by MD - patient able to verbalize understanding, all questions fully answered.   Patient instructed to return to ED, call 911, or call MD for any changes in condition.   Patient escorted via WC, and D/C home via private auto.  [name] 03/26/2023 2:58 PM

## 2023-03-26 NOTE — Progress Notes (Signed)
ABI exam has been completed.   Results can be found under chart review under CV PROC. 03/26/2023 3:04 PM Jedrek Dinovo RVT, RDMS

## 2023-03-27 LAB — VAS US ABI WITH/WO TBI

## 2023-03-30 LAB — CULTURE, BLOOD (ROUTINE X 2)
Culture: NO GROWTH
Culture: NO GROWTH
Special Requests: ADEQUATE

## 2023-03-31 ENCOUNTER — Telehealth: Payer: Self-pay

## 2023-03-31 ENCOUNTER — Observation Stay (HOSPITAL_COMMUNITY)
Admission: EM | Admit: 2023-03-31 | Discharge: 2023-04-01 | Disposition: A | Discharge status: Home / Self Care | Payer: Medicare HMO | Attending: Internal Medicine | Admitting: Internal Medicine

## 2023-03-31 ENCOUNTER — Encounter (HOSPITAL_COMMUNITY): Payer: Self-pay

## 2023-03-31 ENCOUNTER — Other Ambulatory Visit: Payer: Self-pay

## 2023-03-31 DIAGNOSIS — S91331A Puncture wound without foreign body, right foot, initial encounter: Secondary | ICD-10-CM | POA: Diagnosis not present

## 2023-03-31 DIAGNOSIS — I4819 Other persistent atrial fibrillation: Secondary | ICD-10-CM | POA: Diagnosis not present

## 2023-03-31 DIAGNOSIS — I83891 Varicose veins of right lower extremities with other complications: Secondary | ICD-10-CM | POA: Diagnosis present

## 2023-03-31 DIAGNOSIS — I712 Thoracic aortic aneurysm, without rupture, unspecified: Secondary | ICD-10-CM | POA: Diagnosis present

## 2023-03-31 DIAGNOSIS — I10 Essential (primary) hypertension: Secondary | ICD-10-CM | POA: Diagnosis not present

## 2023-03-31 DIAGNOSIS — Z7901 Long term (current) use of anticoagulants: Secondary | ICD-10-CM | POA: Insufficient documentation

## 2023-03-31 DIAGNOSIS — N182 Chronic kidney disease, stage 2 (mild): Secondary | ICD-10-CM | POA: Insufficient documentation

## 2023-03-31 DIAGNOSIS — D62 Acute posthemorrhagic anemia: Principal | ICD-10-CM | POA: Insufficient documentation

## 2023-03-31 DIAGNOSIS — I4891 Unspecified atrial fibrillation: Secondary | ICD-10-CM | POA: Diagnosis not present

## 2023-03-31 DIAGNOSIS — I5032 Chronic diastolic (congestive) heart failure: Secondary | ICD-10-CM | POA: Insufficient documentation

## 2023-03-31 DIAGNOSIS — I7121 Aneurysm of the ascending aorta, without rupture: Secondary | ICD-10-CM | POA: Diagnosis not present

## 2023-03-31 DIAGNOSIS — I2510 Atherosclerotic heart disease of native coronary artery without angina pectoris: Secondary | ICD-10-CM | POA: Diagnosis not present

## 2023-03-31 DIAGNOSIS — R339 Retention of urine, unspecified: Secondary | ICD-10-CM | POA: Insufficient documentation

## 2023-03-31 DIAGNOSIS — L0390 Cellulitis, unspecified: Secondary | ICD-10-CM | POA: Diagnosis not present

## 2023-03-31 DIAGNOSIS — R42 Dizziness and giddiness: Secondary | ICD-10-CM | POA: Diagnosis not present

## 2023-03-31 DIAGNOSIS — W19XXXA Unspecified fall, initial encounter: Secondary | ICD-10-CM | POA: Insufficient documentation

## 2023-03-31 DIAGNOSIS — D649 Anemia, unspecified: Principal | ICD-10-CM | POA: Diagnosis present

## 2023-03-31 DIAGNOSIS — I959 Hypotension, unspecified: Secondary | ICD-10-CM | POA: Diagnosis not present

## 2023-03-31 DIAGNOSIS — R338 Other retention of urine: Secondary | ICD-10-CM | POA: Diagnosis present

## 2023-03-31 DIAGNOSIS — Z96641 Presence of right artificial hip joint: Secondary | ICD-10-CM | POA: Diagnosis not present

## 2023-03-31 DIAGNOSIS — F0390 Unspecified dementia without behavioral disturbance: Secondary | ICD-10-CM | POA: Diagnosis not present

## 2023-03-31 DIAGNOSIS — Z79899 Other long term (current) drug therapy: Secondary | ICD-10-CM | POA: Diagnosis not present

## 2023-03-31 DIAGNOSIS — R58 Hemorrhage, not elsewhere classified: Secondary | ICD-10-CM

## 2023-03-31 DIAGNOSIS — Z96652 Presence of left artificial knee joint: Secondary | ICD-10-CM | POA: Insufficient documentation

## 2023-03-31 DIAGNOSIS — I130 Hypertensive heart and chronic kidney disease with heart failure and stage 1 through stage 4 chronic kidney disease, or unspecified chronic kidney disease: Secondary | ICD-10-CM | POA: Diagnosis not present

## 2023-03-31 DIAGNOSIS — E785 Hyperlipidemia, unspecified: Secondary | ICD-10-CM | POA: Diagnosis not present

## 2023-03-31 LAB — CBC WITH DIFFERENTIAL/PLATELET
Abs Immature Granulocytes: 0.04 10*3/uL (ref 0.00–0.07)
Basophils Absolute: 0 10*3/uL (ref 0.0–0.1)
Basophils Relative: 0 %
Eosinophils Absolute: 0.1 10*3/uL (ref 0.0–0.5)
Eosinophils Relative: 1 %
HCT: 24.6 % — ABNORMAL LOW (ref 39.0–52.0)
Hemoglobin: 7.4 g/dL — ABNORMAL LOW (ref 13.0–17.0)
Immature Granulocytes: 1 %
Lymphocytes Relative: 7 %
Lymphs Abs: 0.6 10*3/uL — ABNORMAL LOW (ref 0.7–4.0)
MCH: 26 pg (ref 26.0–34.0)
MCHC: 30.1 g/dL (ref 30.0–36.0)
MCV: 86.3 fL (ref 80.0–100.0)
Monocytes Absolute: 0.5 10*3/uL (ref 0.1–1.0)
Monocytes Relative: 6 %
Neutro Abs: 6.8 10*3/uL (ref 1.7–7.7)
Neutrophils Relative %: 85 %
Platelets: 273 10*3/uL (ref 150–400)
RBC: 2.85 MIL/uL — ABNORMAL LOW (ref 4.22–5.81)
RDW: 15.7 % — ABNORMAL HIGH (ref 11.5–15.5)
WBC: 8 10*3/uL (ref 4.0–10.5)
nRBC: 0 % (ref 0.0–0.2)

## 2023-03-31 LAB — BASIC METABOLIC PANEL
Anion gap: 4 — ABNORMAL LOW (ref 5–15)
BUN: 13 mg/dL (ref 8–23)
CO2: 24 mmol/L (ref 22–32)
Calcium: 7.6 mg/dL — ABNORMAL LOW (ref 8.9–10.3)
Chloride: 111 mmol/L (ref 98–111)
Creatinine, Ser: 1.12 mg/dL (ref 0.61–1.24)
GFR, Estimated: >60 mL/min (ref >60)
Glucose, Bld: 101 mg/dL — ABNORMAL HIGH (ref 70–99)
Potassium: 3.9 mmol/L (ref 3.5–5.1)
Sodium: 139 mmol/L (ref 135–145)

## 2023-03-31 LAB — FERRITIN: Ferritin: 20 ng/mL — ABNORMAL LOW (ref 24–336)

## 2023-03-31 LAB — HEPATIC FUNCTION PANEL
ALT: 25 U/L (ref 0–44)
AST: 27 U/L (ref 15–41)
Albumin: 2.4 g/dL — ABNORMAL LOW (ref 3.5–5.0)
Alkaline Phosphatase: 46 U/L (ref 38–126)
Bilirubin, Direct: <0.1 mg/dL (ref 0.0–0.2)
Total Bilirubin: 0.3 mg/dL (ref 0.3–1.2)
Total Protein: 4.9 g/dL — ABNORMAL LOW (ref 6.5–8.1)

## 2023-03-31 LAB — RETICULOCYTES
Immature Retic Fract: 21.6 % — ABNORMAL HIGH (ref 2.3–15.9)
RBC.: 2.84 MIL/uL — ABNORMAL LOW (ref 4.22–5.81)
Retic Count, Absolute: 58 10*3/uL (ref 19.0–186.0)
Retic Ct Pct: 2 % (ref 0.4–3.1)

## 2023-03-31 LAB — IRON AND TIBC
Iron: 12 ug/dL — ABNORMAL LOW (ref 45–182)
Saturation Ratios: 4 % — ABNORMAL LOW (ref 17.9–39.5)
TIBC: 332 ug/dL (ref 250–450)
UIBC: 320 ug/dL

## 2023-03-31 LAB — PREPARE RBC (CROSSMATCH)

## 2023-03-31 LAB — MAGNESIUM: Magnesium: 1.7 mg/dL (ref 1.7–2.4)

## 2023-03-31 LAB — TSH: TSH: 1.335 u[IU]/mL (ref 0.350–4.500)

## 2023-03-31 LAB — PHOSPHORUS: Phosphorus: 3.4 mg/dL (ref 2.5–4.6)

## 2023-03-31 LAB — CK: Total CK: 150 U/L (ref 49–397)

## 2023-03-31 MED ORDER — SODIUM CHLORIDE 0.9% IV SOLUTION: Freq: Once | INTRAVENOUS | Status: DC

## 2023-03-31 MED ORDER — LIDOCAINE-EPINEPHRINE-TETRACAINE (LET) TOPICAL GEL
3.0000 mL | Freq: Once | TOPICAL | Status: DC
  Filled 2023-03-31: qty 3

## 2023-03-31 NOTE — Assessment & Plan Note (Signed)
Chronic stable currently appears euvolemic does not tolerate Lasix due to hypotension

## 2023-03-31 NOTE — Assessment & Plan Note (Signed)
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 Hold Eliquis Given history of CAD will transfuse patient was written for 2 units from ER given that he dropped about 2 units from 5 days ago. Continue to monitor hemoglobin Patient will need to see vascular surgery as an outpatient for definitive management or sooner if bleeding resumes

## 2023-03-31 NOTE — Assessment & Plan Note (Signed)
Chronic stable patient will need to have follow-up with vascular surgery

## 2023-03-31 NOTE — Assessment & Plan Note (Signed)
Patient is supposed to self cath  Apparently he has not gotten around to it all day and at the time he got to the room he had 543 in his bladder. Ordered In-N-Out cath we will continue to monitor strict I's and O's and encourage patient to self cath on a regular basis

## 2023-03-31 NOTE — H&P (Addendum)
[name]:[id number] DOB: Jul 02, 1941 DOA: 03/31/2023     PCP: Renford Dills, MD   Outpatient Specialists: * NONE CARDS:  Dr. Armanda Magic, MD   ID: Dr. Daiva Eves   Patient arrived to ER on 03/31/23 at 1702 Referred by Attending Gerhard Munch, MD   Patient coming from:    Home     Chief Complaint:   Chief Complaint  Patient presents with   bleeding vein    HPI: [name] is a 82 y.o. 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  He then got up and had some shaking movement of his arm followed by fecal incontinence and was taken to the ER.  EEG and MRI were negative.  Seen by neurology and ultimately felt not to be a seizure and likely a near syncopal episode   He does has history of diastolic CHF but does not tolerate diuretics  Denies significant ETOH intake   Does not smoke   Lab Results  Component Value Date   SARSCOV2NAA NEGATIVE 08/17/2020   SARSCOV2NAA  NEGATIVE 07/29/2020   SARSCOV2NAA NEGATIVE 04/05/2020      Regarding pertinent Chronic problems:    Hyperlipidemia -  on statins Lipitor (atorvastatin)  Lipid Panel     Component Value Date/Time   CHOL 115 10/17/2021 1014   TRIG 47 10/17/2021 1014   HDL 47 10/17/2021 1014   CHOLHDL 2.4 10/17/2021 1014   CHOLHDL 1.9 06/19/2016 0809   VLDL 7 06/19/2016 0809   LDLCALC 57 10/17/2021 1014   LABVLDL 11 10/17/2021 1014     HTN on Coreg   chronic CHF diastolic  - last echo  Recent Results (from the past 40102 hour(s))  ECHOCARDIOGRAM COMPLETE   Collection Time: 07/17/20  3:58 PM  Result Value   S' Lateral 3.10   Area-P 1/2 2.95   MV M vel 4.86   MV Peak grad 94.5   P 1/2 time 622   Narrative      ECHOCARDIOGRAM REPORT     IMPRESSIONS    1. Left ventricular ejection fraction, by estimation, is 60 to 65%. The left ventricle has normal function. The left ventricle has no regional wall motion abnormalities. Left ventricular diastolic parameters are consistent with Grade I diastolic  dysfunction (impaired relaxation).  2. Right ventricular systolic function is normal. The right ventricular size is normal.  3. Left atrial size was mildly dilated.  4. Right atrial size was  mildly dilated.  5. The mitral valve is normal in structure. Mild mitral valve regurgitation. No evidence of mitral stenosis.  6. The aortic valve is normal in structure. Aortic valve regurgitation is trivial. Mild aortic valve sclerosis is present, with no evidence of aortic valve stenosis. Aortic regurgitation PHT measures 622 msec.  7. Aortic dilatation noted. There is borderline dilatation at the level of the sinuses of Valsalva, measuring 41 mm.  8. The inferior vena cava is normal in size with greater than 50% respiratory variability, suggesting right atrial pressure of 3 mmHg.           CAD  - On statin, betablocker,                  -  followed by cardiology            Myoview 07/22/2020 EF 64 apical  inferior and apical infarct with peri-infarct ischemia; low risk (no change from 2010)  bare-metal stent to the RCA in 2007 and nonischemic stress test in January 2022.      A. 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  Lab Results  Component Value Date   CREATININE 1.12 03/31/2023   CREATININE 1.13 03/26/2023   CREATININE 1.11 03/25/2023   Lab Results  Component Value Date   NA 139 03/31/2023   CL 111 03/31/2023   K 3.9 03/31/2023   CO2 24 03/31/2023   BUN 13 03/31/2023   CREATININE 1.12 03/31/2023   GFRNONAA >60 03/31/2023   CALCIUM 7.6 (L) 03/31/2023   ALBUMIN 2.7 (L) 03/25/2023   GLUCOSE 101 (H) 03/31/2023         Dementia - on mild    Chronic anemia - baseline hg Hemoglobin & Hematocrit  Recent Labs    03/25/23 1511 03/26/23 0936 03/31/23 1721  HGB 8.5* 9.1* 7.4*   Iron/TIBC/Ferritin/ %Sat No results found for: "IRON", "TIBC", "FERRITIN", "IRONPCTSAT"     While in ER:   Noted that his hemoglobin drifted from 9.1-7.4 he was written for 2 units of RBCs     Lab Orders         Basic metabolic panel         CBC with Differential       Following Medications were ordered in ER: Medications  lidocaine-EPINEPHrine-tetracaine (LET) topical gel (has no administration in time range)  0.9 %  sodium chloride infusion (Manually program via Guardrails IV Fluids) (has no administration in time range)     ED Triage Vitals  Encounter Vitals Group     BP 03/31/23 1714 (!) 140/69     Systolic BP Percentile --      Diastolic BP  Percentile --      Pulse Rate 03/31/23 1714 73     Resp 03/31/23 1714 16     Temp 03/31/23 1714 97.9 F (36.6 C)     Temp Source 03/31/23 1714 Oral  SpO2 03/31/23 1711 98 %     Weight 03/31/23 1715 145 lb 11.6 oz (66.1 kg)     Height 03/31/23 1715 6\' 2"  (1.88 m)     Head Circumference --      Peak Flow --      Pain Score 03/31/23 1714 0     Pain Loc --      Pain Education --      Exclude from Growth Chart --   ZOXW(96)@     _________________________________________ Significant initial  Findings: Abnormal Labs Reviewed  BASIC METABOLIC PANEL - Abnormal; Notable for the following components:      Result Value   Glucose, Bld 101 (*)    Calcium 7.6 (*)    Anion gap 4 (*)    All other components within normal limits  CBC WITH DIFFERENTIAL/PLATELET - Abnormal; Notable for the following components:   RBC 2.85 (*)    Hemoglobin 7.4 (*)    HCT 24.6 (*)    RDW 15.7 (*)    Lymphs Abs 0.6 (*)    All other components within normal limits        ECG: Ordered   BNP (last 3 results) Recent Labs    02/28/23 2013 03/02/23 0639 03/03/23 0024  BNP 445.7* 514.5* 356.4*    The recent clinical data is shown below. Vitals:   03/31/23 1711 03/31/23 1714 03/31/23 1715 03/31/23 1817  BP:  (!) 140/69    Pulse:  73    Resp:  16    Temp:  97.9 F (36.6 C)    TempSrc:  Oral    SpO2: 98%   100%  Weight:   66.1 kg   Height:   6\' 2"  (1.88 m)     WBC     Component Value Date/Time   WBC 8.0 03/31/2023 1721   LYMPHSABS 0.6 (L) 03/31/2023 1721   MONOABS 0.5 03/31/2023 1721   EOSABS 0.1 03/31/2023 1721   BASOSABS 0.0 03/31/2023 1721     Results for orders placed or performed during the hospital encounter of 03/25/23  Blood culture (routine x 2)     Status: None   Collection Time: 03/25/23  5:05 PM   Specimen: BLOOD  Result Value Ref Range Status   Specimen Description BLOOD RIGHT ANTECUBITAL  Final   Special Requests   Final    BOTTLES DRAWN AEROBIC AND ANAEROBIC Blood Culture  adequate volume   Culture   Final    NO GROWTH 5 DAYS Performed at [facility], [address], [place], [place] [address]    Report Status 03/30/2023 FINAL  Final  Blood culture (routine x 2)     Status: None   Collection Time: 03/25/23  5:14 PM   Specimen: BLOOD  Result Value Ref Range Status   Specimen Description BLOOD LEFT ANTECUBITAL  Final   Special Requests   Final    BOTTLES DRAWN AEROBIC ONLY Blood Culture results may not be optimal due to an inadequate volume of blood received in culture bottles   Culture   Final    NO GROWTH 5 DAYS Performed at [facility], [address] [place], [place] [address]    Report Status 03/30/2023 FINAL  Final     __________________________________________________________ Recent Labs  Lab 03/25/23 1511 03/26/23 0936 03/31/23 1721  NA 136 138 139  K 4.0 4.3 3.9  CO2 24 26 24   GLUCOSE 98 90 101*  BUN 17 15 13   CREATININE 1.11 1.13  1.12  CALCIUM 8.3* 8.3* 7.6*    Cr   stable,   Lab Results  Component Value Date   CREATININE 1.12 03/31/2023   CREATININE 1.13 03/26/2023   CREATININE 1.11 03/25/2023    Recent Labs  Lab 03/25/23 1511  AST 34  ALT 29  ALKPHOS 57  BILITOT 0.4  PROT 5.5*  ALBUMIN 2.7*   Lab Results  Component Value Date   CALCIUM 7.6 (L) 03/31/2023    Plt: Lab Results  Component Value Date   PLT 273 03/31/2023       Recent Labs  Lab 03/25/23 1511 03/26/23 0936 03/31/23 1721  WBC 8.6 5.9 8.0  NEUTROABS 7.1  --  6.8  HGB 8.5* 9.1* 7.4*  HCT 28.3* 29.5* 24.6*  MCV 86.0 83.6 86.3  PLT 238 296 273    HG/HCT    Down  from baseline see below    Component Value Date/Time   HGB 7.4 (L) 03/31/2023 1721   HGB 9.8 (L) 10/17/2021 1014   HCT 24.6 (L) 03/31/2023 1721   HCT 31.8 (L) 10/17/2021 1014   MCV 86.3 03/31/2023 1721   MCV 75 (L) 10/17/2021 1014    _______________________________________________ Hospitalist was called for admission for symptomatic blood loss anemia   The  following Work up has been ordered so far:  Orders Placed This Encounter  Procedures   Basic metabolic panel   CBC with Differential   Informed Consent Details: Physician/Practitioner Attestation; Transcribe to consent form and obtain patient signature   Consult to hospitalist   Prepare RBC (crossmatch)   Type and screen     OTHER Significant initial  Findings:  labs showing:     DM  labs:  HbA1C: Recent Labs    03/25/23 1511  HGBA1C 5.8*       CBG (last 3)  No results for input(s): "GLUCAP" in the last 72 hours.        Cultures:    Component Value Date/Time   SDES BLOOD LEFT ANTECUBITAL 03/25/2023 1714   SPECREQUEST  03/25/2023 1714    BOTTLES DRAWN AEROBIC ONLY Blood Culture results may not be optimal due to an inadequate volume of blood received in culture bottles   CULT  03/25/2023 1714    NO GROWTH 5 DAYS Performed at [facility], [address] [place], [place] [address]    REPTSTATUS 03/30/2023 FINAL 03/25/2023 1714     Radiological Exams on Admission: No results found. _______________________________________________________________________________________________________ Latest  Blood pressure (!) 140/69, pulse 73, temperature 97.9 F (36.6 C), temperature source Oral, resp. rate 16, height 6\' 2"  (1.88 m), weight 66.1 kg, SpO2 100%.   Vitals  labs and radiology finding personally reviewed  Review of Systems:    Pertinent positives include: Bleeding from the right foot lightheadedness  Constitutional:  No weight loss, night sweats, Fevers, chills, fatigue, weight loss  HEENT:  No headaches, Difficulty swallowing,Tooth/dental problems,Sore throat,  No sneezing, itching, ear ache, nasal congestion, post nasal drip,  Cardio-vascular:  No chest pain, Orthopnea, PND, anasarca, dizziness, palpitations.no Bilateral lower extremity swelling  GI:  No heartburn, indigestion, abdominal pain, nausea, vomiting, diarrhea, change in bowel habits, loss of  appetite, melena, blood in stool, hematemesis Resp:  no shortness of breath at rest. No dyspnea on exertion, No excess mucus, no productive cough, No non-productive cough, No coughing up of blood.No change in color of mucus.No wheezing. Skin:  no rash or lesions. No jaundice GU:  no dysuria, change in color of urine, no  urgency or frequency. No straining to urinate.  No flank pain.  Musculoskeletal:  No joint pain or no joint swelling. No decreased range of motion. No back pain.  Psych:  No change in mood or affect. No depression or anxiety. No memory loss.  Neuro: no localizing neurological complaints, no tingling, no weakness, no double vision, no gait abnormality, no slurred speech, no confusion  All systems reviewed and apart from HOPI all are negative _______________________________________________________________________________________________ Past Medical History:   Past Medical History:  Diagnosis Date   Ascending aorta dilatation (HCC)    44mm by chest CT angio 04/2018   CAD (coronary artery disease)    a. 05/2006 Abnl stress test w/ 2-55mm ST dep; b. 05/2006 Cath/PCI: LM nl, LAD 30-40ost, 20-30p, 90d (small), D1 nl, D2 nl, LCX nl, OM1/2 nl, RCA Ca2+, 44m/d (3.5x16 [place] BMS & 3.5x12 Liberty BMS), EF 50%.   Chronic diastolic CHF (congestive heart failure) (HCC)    a. 10/2017 Echo: EF 50-55%, mild AI, sev dil LA, mod dil RA.   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 CHA2DS2VASc 5-->Eliquis; b. Prev WCT on flecainide;  c. 12/2017 s/p DCCV.   Pneumothorax    Prolonged Q-T interval on ECG 06/22/2022   Ventricular tachycardia (HCC)    a. possibly proarrhythmia from flecainide.    Past Surgical History:  Procedure Laterality Date   CARDIOVERSION N/A 12/23/2017   Procedure: CARDIOVERSION;  Surgeon: Quintella Reichert, MD;  Location[facility] ENDOSCOPY;  Service: Cardiovascular;   Laterality: N/A;   CARDIOVERSION N/A 03/08/2018   Procedure: CARDIOVERSION;  Surgeon: Quintella Reichert, MD;  Location: [facility] ENDOSCOPY;  Service: Cardiovascular;  Laterality: N/A;   INGUINAL HERNIA REPAIR     x2   left total hip surgery     TEE WITHOUT CARDIOVERSION N/A 03/08/2018   Procedure: TRANSESOPHAGEAL ECHOCARDIOGRAM (TEE);  Surgeon: Quintella Reichert, MD;  Location: [facility] ENDOSCOPY;  Service: Cardiovascular;  Laterality: N/A;   TOTAL HIP ARTHROPLASTY Right 08/19/2020   Procedure: TOTAL HIP ARTHROPLASTY ANTERIOR APPROACH;  Surgeon: Ollen Gross, MD;  Location: [facility] ORS;  Service: Orthopedics;  Laterality: Right;    TOTAL KNEE ARTHROPLASTY Left 02/28/2018   Procedure: LEFT TOTAL KNEE ARTHROPLASTY;  Surgeon: Ollen Gross, MD;  Location: [facility] ORS;  Service: Orthopedics;  Laterality: Left;   VEIN LIGATION AND STRIPPING Left 04/08/2020   Procedure: LEFT LOWER EXTREMITY VEIN LIGATION AND EXCISION OF ULCER;  Surgeon: Chuck Hint, MD;  Location: [facility] OR;  Service: Vascular;  Laterality: Left;    Social History:  Ambulatory   walker       reports that he has never smoked. He has never used smokeless tobacco. He reports that he does not currently use alcohol. He reports that he does not currently use drugs after having used the following drugs: Other-see comments.     Family History:   Family History  Problem Relation Age of Onset   Coronary artery disease Other    ______________________________________________________________________________________________ Allergies: No Known Allergies   Prior to Admission medications   Medication Sig Start Date End Date Taking? Authorizing Provider  acetaminophen (TYLENOL) 325 MG tablet Take 2 tablets (650 mg total) by mouth every 6 (six) hours as needed for moderate pain. 03/17/22   Wallis Bamberg, PA-C  amoxicillin-clavulanate (AUGMENTIN) 875-125 MG tablet Take 1 tablet by mouth 2 (two) times daily for 5 days. 03/26/23 03/31/23  Pokhrel, Rebekah Chesterfield,  MD   apixaban (ELIQUIS) 2.5 MG TABS tablet Take 1 tablet (2.5 mg total) by mouth 2 (two) times daily. 03/26/23   Pokhrel, Rebekah Chesterfield, MD  atorvastatin (LIPITOR) 20 MG tablet TAKE 1 TABLET(20 MG) BY MOUTH DAILY Patient taking differently: Take 20 mg by mouth daily. 03/10/23   Quintella Reichert, MD  Boswellia Serrata (BOSWELLIA PO) Take 1,200 mg by mouth daily.    [provider]  carvedilol (COREG) 3.125 MG tablet Take 3.125 mg by mouth daily. 01/21/22   [provider]  furosemide (LASIX) 20 MG tablet Take 1 tablet (20 mg total) by mouth daily. 03/26/23 06/24/23  Pokhrel, Rebekah Chesterfield, MD  lidocaine (LIDODERM) 5 % Place 1 patch onto the skin daily.    [provider]  Misc Natural Products (GLUCOSAMINE CHONDROITIN TRIPLE) TABS Take 1 tablet by mouth daily.    [provider]  Multiple Vitamin (MULTIVITAMIN WITH MINERALS) TABS tablet Take 1 tablet by mouth daily.    [provider]  Omega-3 Fatty Acids (FISH OIL) 1200 MG CAPS Take 1,200 mg by mouth daily.    [provider]    ___________________________________________________________________________________________________ Physical Exam:    03/31/2023    5:15 PM 03/31/2023    5:14 PM 03/26/2023    1:27 PM  Vitals with BMI  Height 6\' 2"     Weight 145 lbs 12 oz    BMI 18.7    Systolic  140 137  Diastolic  69 58  Pulse  73 63     1. General:  in No  Acute distress   Chronically ill  -appearing 2. Psychological: Alert and  Oriented 3. Head/ENT:    Dry Mucous Membranes                          Head Non traumatic, neck supple                          Poor Dentition 4. SKIN: n decreased Skin turgor,  Skin clean Dry and intact no rash    5. Heart: Regular rate and rhythm no  Murmur, no Rub or gallop 6. Lungs:   no wheezes or crackles   7. Abdomen: Soft,  non-tender, Non distended  bowel sounds present 8. Lower extremities: no clubbing, cyanosis, no  edema 9. Neurologically Grossly intact, moving all 4  extremities equally   10. MSK: Normal range of motion    Chart has been reviewed  ______________________________________________________________________________________________  Assessment/Plan  82 y.o. male with medical history significant of A.fib on Eliquis,  CAD,HTN, HLD, dementia, Thoracic aortic aneurysm , diastolic CHF  Admitted for acute symptomatic blood loss anemia   Present on Admission:  Symptomatic anemia  ABLA (acute blood loss anemia), symptomatic  HTN (hypertension)  Hyperlipidemia LDL goal <70  Persistent atrial fibrillation (HCC)  Thoracic aortic aneurysm (HCC)  Chronic diastolic CHF (congestive heart failure) (HCC)  CAD (coronary artery disease)  Acute urinary retention     ABLA (acute blood loss anemia), symptomatic Patient had significant bleeding from a varicose vein of his right foot. Hold Eliquis Given history of CAD will transfuse patient was written for 2 units from ER given that he dropped about 2 units from 5 days ago. Continue to monitor hemoglobin Patient will need to see vascular surgery as an outpatient for definitive management or sooner if bleeding resumes  Symptomatic anemia Transfuse 2 units and follow CBC  HTN (hypertension) Allow permissive hypertension  for tonight Once BP allows would restart Coreg at 3.125 mg  Hyperlipidemia LDL goal <70 Chronic stable continue Lipitor 20 mg a day  Persistent atrial fibrillation (HCC) Hold Eliquis for tonight given bleeding.  Restart Coreg when able Continue amiodarone  Thoracic aortic aneurysm (HCC) Chronic stable patient will need to have follow-up with vascular surgery  Chronic diastolic CHF (congestive heart failure) (HCC) Chronic stable currently appears euvolemic does not tolerate Lasix due to hypotension  CAD (coronary artery disease) Chronic stable continue Coreg when able to tolerate and continue Lipitor 20 mg a day  Acute urinary retention Patient is supposed to self cath   Apparently he has not gotten around to it all day and at the time he got to the room he had 543 in his bladder. Ordered In-N-Out cath we will continue to monitor strict I's and O's and encourage patient to self cath on a regular basis    Other plan as per orders.  DVT prophylaxis:  SCD     Code Status:    Code Status: Prior FULL CODE  as per patient   I had personally discussed CODE STATUS with patient and family  ACP   none   Family Communication:   Family at  Bedside  plan of care was discussed on the phone with  Daughter   Diet heart healthy   Disposition Plan:        To home once workup is complete and patient is stable hemoglobin improved no more evidence of bleeding       Consult Orders  (From admission, onward)           Start     Ordered   03/31/23 2004  Consult to hospitalist  Pg by Viviann Spare  Once       Provider:  (Not yet assigned)  Question Answer Comment  Place call to: [facility]   Reason for Consult Admit      03/31/23 2003               Admission status:  ED Disposition     ED Disposition  Admit   Condition  --   Comment  Hospital Area: [facility] [[id number]]  Level of Care: Telemetry Medical [[id number]]  May place patient in observation at [facility] or [facility] if equivalent level of care is available:: No  Covid Evaluation: Asymptomatic - no recent exposure (last 10 days) testing not required  Diagnosis: Symptomatic anemia [[id number]]  Admitting Physician: Therisa Doyne [[id number]]  Attending Physician: Therisa Doyne [[id number]]           Obs     Level of care     tele  For 12H     Efstathios Sawin 03/31/2023, 11:24 PM    [facility]     after 2 AM please page floor coverage PA If 7AM-7PM, please contact the day team taking care of the patient using [email or web address]

## 2023-03-31 NOTE — Assessment & Plan Note (Signed)
Chronic stable continue Lipitor 20 mg a day 

## 2023-03-31 NOTE — ED Provider Notes (Signed)
[place] [facility] DE[facility]MENT AT [facility] Provider Note   CSN: [id number] Arrival date & time: 03/31/23  1702     History  Chief Complaint  Patient presents with   bleeding vein    [name] is a 82 y.o. male.  HPI Patient with multiple medical issues including need for anticoagulation presents via EMS with concern for bleeding from an ankle wound. Patient notes a history of varicosities, states that he was in his usual state of health after an episode of cellulitis, when he noticed bleeding from his right ankle.  He notes some lightheadedness at the time, but no syncope, no chest pain, no dyspnea, no fall.  EMS reports the patient had dizziness as a complaint, he received 0.5 L normal saline en route.    Home Medications Prior to Admission medications   Medication Sig Start Date End Date Taking? Authorizing Provider  acetaminophen (TYLENOL) 325 MG tablet Take 2 tablets (650 mg total) by mouth every 6 (six) hours as needed for moderate pain. 03/17/22   Wallis Bamberg, PA-C  amoxicillin-clavulanate (AUGMENTIN) 875-125 MG tablet Take 1 tablet by mouth 2 (two) times daily for 5 days. 03/26/23 03/31/23  Pokhrel, Rebekah Chesterfield, MD  apixaban (ELIQUIS) 2.5 MG TABS tablet Take 1 tablet (2.5 mg total) by mouth 2 (two) times daily. 03/26/23   Pokhrel, Rebekah Chesterfield, MD  atorvastatin (LIPITOR) 20 MG tablet TAKE 1 TABLET(20 MG) BY MOUTH DAILY Patient taking differently: Take 20 mg by mouth daily. 03/10/23   Quintella Reichert, MD  Boswellia Serrata (BOSWELLIA PO) Take 1,200 mg by mouth daily.    [provider]  carvedilol (COREG) 3.125 MG tablet Take 3.125 mg by mouth daily. 01/21/22   [provider]  furosemide (LASIX) 20 MG tablet Take 1 tablet (20 mg total) by mouth daily. 03/26/23 06/24/23  Pokhrel, Rebekah Chesterfield, MD  lidocaine (LIDODERM) 5 % Place 1 patch onto the skin daily.    [provider]  Misc Natural Products (GLUCOSAMINE CHONDROITIN TRIPLE) TABS Take 1 tablet by mouth  daily.    [provider]  Multiple Vitamin (MULTIVITAMIN WITH MINERALS) TABS tablet Take 1 tablet by mouth daily.    [provider]  Omega-3 Fatty Acids (FISH OIL) 1200 MG CAPS Take 1,200 mg by mouth daily.    [provider]      Allergies    Patient has no known allergies.    Review of Systems   Review of Systems  All other systems reviewed and are negative.   Physical Exam Updated Vital Signs BP (!) 140/69   Pulse 73   Temp 97.9 F (36.6 C) (Oral)   Resp 16   Ht 6\' 2"  (1.88 m)   Wt 66.1 kg   SpO2 98%   BMI 18.71 kg/m  Physical Exam Vitals and nursing note reviewed.  Constitutional:      General: He is not in acute distress.    Appearance: He is well-developed.  HENT:     Head: Normocephalic and atraumatic.  Eyes:     Conjunctiva/sclera: Conjunctivae normal.  Cardiovascular:     Rate and Rhythm: Normal rate and regular rhythm.     Pulses: Normal pulses.  Pulmonary:     Effort: Pulmonary effort is normal. No respiratory distress.     Breath sounds: No stridor.  Abdominal:     General: There is no distension.  Musculoskeletal:       Legs:  Skin:    General: Skin is warm and dry.  Neurological:     Mental Status: He is alert and oriented to person, place, and time.     ED Results / Procedures / Treatments   Labs (all labs ordered are listed, but only abnormal results are displayed) Labs Reviewed  BASIC METABOLIC PANEL  CBC WITH DIFFERENTIAL/PLATELET    EKG None  Radiology No results found.  Procedures Procedures    Medications Ordered in ED Medications  lidocaine-EPINEPHrine-tetracaine (LET) topical gel (has no administration in time range)    ED Course/ Medical Decision Making/ A&P                                 Medical Decision Making Adult male with multiple medical issues including A-fib, anticoagulated presents with bleeding foot wound.  He is awake, alert, hemodynamically unremarkable.  Given  description of substantial blood loss, labs indicated, ordered. Patient's wound clean, without evidence for erythema/cellulitis.  Suspicion for varicosity with either minor trauma versus spontaneous rupture.  Amount and/or Complexity of Data Reviewed External Data Reviewed: notes.    Details: Patient with recent cellulitis Labs: ordered. Decision-making details documented in ED Course.  Risk Prescription drug management. Decision regarding hospitalization.   11:16 PM Patient accompanied at bedside by his son-in-law, and via telephone I discussed findings with him and his wife.  Patient's wound unwrapped, hemostatic, let applied, with additional pressure dressing. Labs reviewed, concerning for hemoglobin drop of 2 g over the past 3 days.  Given this, the patient's heart failure history, the patient will be transfused, admitted. This was discussed with the patient, family, and our internal medicine colleagues.   CRITICAL CARE Performed by: Gerhard Munch Total critical care time: 35 minutes Critical care time was exclusive of separately billable procedures and treating other patients. Critical care was necessary to treat or prevent imminent or life-threatening deterioration. Critical care was time spent personally by me on the following activities: development of treatment plan with patient and/or surrogate as well as nursing, discussions with consultants, evaluation of patient's response to treatment, examination of patient, obtaining history from patient or surrogate, ordering and performing treatments and interventions, ordering and review of laboratory studies, ordering and review of radiographic studies, pulse oximetry and re-evaluation of patient's condition.          Final Clinical Impression(s) / ED Diagnoses Final diagnoses:  Symptomatic anemia  Bleeding  Penetrating wound of right foot, initial encounter     Gerhard Munch, MD 03/31/23 2318

## 2023-03-31 NOTE — ED Notes (Signed)
ED TO INPATIENT HANDOFF REPORT  ED Nurse Name and Phone #: Pearletha Forge RN [phone]  S Name/Age/Gender [name] 82 y.o. male Room/Bed: 044C/044C  Code Status   Code Status: Full Code  Home/SNF/Other Home Patient oriented to: self, place, time, and situation Is this baseline? Yes   Triage Complete: Triage complete  Chief Complaint Symptomatic anemia [D64.9]  Triage Note Pt arrived via [facility] from home for a varicose vein popped on right ankle and started bleeding. Per EMS, pt lost approx 100-277mL of blood. Bleeding is controlled, pt has pressure dressing on it.  Per EMS, pt c/o dizziness at scene. EMS gave NS . Pt is on eliquis.   Allergies No Known Allergies  Level of Care/Admitting Diagnosis ED Disposition     ED Disposition  Admit   Condition  --   Comment  Hospital Area: [facility] [[id number]]  Level of Care: Telemetry Medical [104]  May place patient in observation at [facility] or [facility] if equivalent level of care is available:: No  Covid Evaluation: Asymptomatic - no recent exposure (last 10 days) testing not required  Diagnosis: Symptomatic anemia [1517616]  Admitting Physician: Therisa Doyne [[id number]]  Attending Physician: Therisa Doyne [[id number]]          B Medical/Surgery History Past Medical History:  Diagnosis Date   Ascending aorta dilatation (HCC)    44mm by chest CT angio 04/2018   CAD (coronary artery disease)    a. 05/2006 Abnl stress test w/ 2-27mm ST dep; b. 05/2006 Cath/PCI: LM nl, LAD 30-40ost, 20-30p, 90d (small), D1 nl, D2 nl, LCX nl, OM1/2 nl, RCA Ca2+, 16m/d (3.5x16 Liberty BMS & 3.5x12 Liberty BMS), EF 50%.   Chronic diastolic CHF (congestive heart failure) (HCC)    a. 10/2017 Echo: EF 50-55%, mild AI, sev dil LA, mod dil RA.   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 CHA2DS2VASc  5-->Eliquis; b. Prev WCT on flecainide;  c. 12/2017 s/p DCCV.   Pneumothorax    Prolonged Q-T interval on ECG 06/22/2022   Ventricular tachycardia (HCC)    a. possibly proarrhythmia from flecainide.   Past Surgical History:  Procedure Laterality Date   CARDIOVERSION N/A 12/23/2017   Procedure: CARDIOVERSION;  Surgeon: Quintella Reichert, MD;  Location: [facility] ENDOSCOPY;  Service: Cardiovascular;  Laterality: N/A;   CARDIOVERSION N/A 03/08/2018   Procedure: CARDIOVERSION;  Surgeon: Quintella Reichert, MD;  Location: [facility] ENDOSCOPY;  Service: Cardiovascular;  Laterality: N/A;   INGUINAL HERNIA REPAIR     x2   left total hip surgery     TEE WITHOUT CARDIOVERSION N/A 03/08/2018   Procedure: TRANSESOPHAGEAL ECHOCARDIOGRAM (TEE);  Surgeon: Quintella Reichert, MD;  Location: [facility] ENDOSCOPY;  Service: Cardiovascular;  Laterality: N/A;   TOTAL HIP ARTHROPLASTY Right 08/19/2020   Procedure: TOTAL HIP ARTHROPLASTY ANTERIOR APPROACH;  Surgeon: Ollen Gross, MD;  Location: [facility] ORS;  Service: Orthopedics;  Laterality: Right;    TOTAL KNEE ARTHROPLASTY Left 02/28/2018   Procedure: LEFT TOTAL KNEE ARTHROPLASTY;  Surgeon: Ollen Gross, MD;  Location: [facility] ORS;  Service: Orthopedics;  Laterality: Left;   VEIN LIGATION AND STRIPPING Left 04/08/2020   Procedure: LEFT LOWER EXTREMITY VEIN LIGATION AND EXCISION OF ULCER;  Surgeon: Chuck Hint, MD;  Location: [facility] OR;  Service: Vascular;  Laterality: Left;     A IV Location/Drains/Wounds Patient Lines/Drains/Airways Status     Active  Line/Drains/Airways     Name Placement date Placement time Site Days   Peripheral IV 03/31/23 20 G Right Antecubital 03/31/23  --  Antecubital  less than 1            Intake/Output Last 24 hours  Intake/Output Summary (Last 24 hours) at 03/31/2023 2348 Last data filed at 03/31/2023 2234 Gross per 24 hour  Intake 500 ml  Output --  Net 500 ml    Labs/Imaging Results for orders placed or performed during the hospital  encounter of 03/31/23 (from the past 48 hour(s))  Basic metabolic panel     Status: Abnormal   Collection Time: 03/31/23  5:21 PM  Result Value Ref Range   Sodium 139 135 - 145 mmol/L   Potassium 3.9 3.5 - 5.1 mmol/L   Chloride 111 98 - 111 mmol/L   CO2 24 22 - 32 mmol/L   Glucose, Bld 101 (H) 70 - 99 mg/dL    Comment: Glucose reference range applies only to samples taken after fasting for at least 8 hours.   BUN 13 8 - 23 mg/dL   Creatinine, Ser 3.32 0.61 - 1.24 mg/dL   Calcium 7.6 (L) 8.9 - 10.3 mg/dL   GFR, Estimated >95 >18 mL/min    Comment: (NOTE) Calculated using the CKD-EPI Creatinine Equation (2021)    Anion gap 4 (L) 5 - 15    Comment: Performed at [facility] Lab, [address] [place], [place] [address]  CBC with Differential     Status: Abnormal   Collection Time: 03/31/23  5:21 PM  Result Value Ref Range   WBC 8.0 4.0 - 10.5 K/uL   RBC 2.85 (L) 4.22 - 5.81 MIL/uL   Hemoglobin 7.4 (L) 13.0 - 17.0 g/dL   HCT 06.3 (L) 01.6 - 01.0 %   MCV 86.3 80.0 - 100.0 fL   MCH 26.0 26.0 - 34.0 pg   MCHC 30.1 30.0 - 36.0 g/dL   RDW 93.2 (H) 35.5 - 73.2 %   Platelets 273 150 - 400 K/uL   nRBC 0.0 0.0 - 0.2 %   Neutrophils Relative % 85 %   Neutro Abs 6.8 1.7 - 7.7 K/uL   Lymphocytes Relative 7 %   Lymphs Abs 0.6 (L) 0.7 - 4.0 K/uL   Monocytes Relative 6 %   Monocytes Absolute 0.5 0.1 - 1.0 K/uL   Eosinophils Relative 1 %   Eosinophils Absolute 0.1 0.0 - 0.5 K/uL   Basophils Relative 0 %   Basophils Absolute 0.0 0.0 - 0.1 K/uL   Immature Granulocytes 1 %   Abs Immature Granulocytes 0.04 0.00 - 0.07 K/uL    Comment: Performed at [facility], [address], [place], [place] [address]  CK     Status: None   Collection Time: 03/31/23  5:21 PM  Result Value Ref Range   Total CK 150 49 - 397 U/L    Comment: Performed at [facility] Lab, [address] [place], [place] [address]  Magnesium     Status: None   Collection Time: 03/31/23  5:21 PM  Result Value Ref  Range   Magnesium 1.7 1.7 - 2.4 mg/dL    Comment: Performed at [facility], [address] [place], [place] [address]  Phosphorus     Status: None   Collection Time: 03/31/23  5:21 PM  Result Value Ref Range   Phosphorus 3.4 2.5 - 4.6 mg/dL    Comment: Performed at [facility], [address], [place], [place] [address]  TSH     Status: None   Collection Time: 03/31/23  5:21 PM  Result Value Ref Range   TSH 1.335 0.350 - 4.500 uIU/mL    Comment: Performed by a 3rd Generation assay with a functional sensitivity of <=0.01 uIU/mL. Performed at [facility] Lab, [address], [place], [place] [address]   Iron and TIBC     Status: Abnormal   Collection Time: 03/31/23  5:21 PM  Result Value Ref Range   Iron 12 (L) 45 - 182 ug/dL   TIBC 829 562 - 130 ug/dL   Saturation Ratios 4 (L) 17.9 - 39.5 %   UIBC 320 ug/dL    Comment: Performed at [facility], [address], [place], [place] [address]  Ferritin     Status: Abnormal   Collection Time: 03/31/23  5:21 PM  Result Value Ref Range   Ferritin 20 (L) 24 - 336 ng/mL    Comment: Performed at [facility], [address], [place], [place] [address]  Reticulocytes     Status: Abnormal   Collection Time: 03/31/23  5:21 PM  Result Value Ref Range   Retic Ct Pct 2.0 0.4 - 3.1 %    Comment: REPEATED TO VERIFY   RBC. 2.84 (L) 4.22 - 5.81 MIL/uL    Comment: RESULTS CONFIRMED BY MANUAL DILUTION   Retic Count, Absolute 58.0 19.0 - 186.0 K/uL   Immature Retic Fract 21.6 (H) 2.3 - 15.9 %    Comment: Performed at [facility], [address], [place], [place] [address]  Hepatic function panel     Status: Abnormal   Collection Time: 03/31/23  5:21 PM  Result Value Ref Range   Total Protein 4.9 (L) 6.5 - 8.1 g/dL   Albumin 2.4 (L) 3.5 - 5.0 g/dL   AST 27 15 - 41 U/L   ALT 25 0 - 44 U/L   Alkaline Phosphatase 46 38 - 126 U/L   Total Bilirubin 0.3 0.3 - 1.2 mg/dL   Bilirubin, Direct <1.3 0.0 - 0.2 mg/dL    Indirect Bilirubin NOT CALCULATED 0.3 - 0.9 mg/dL    Comment: Performed at [facility], [address] [place], [place] [address]  Prepare RBC (crossmatch)     Status: None   Collection Time: 03/31/23  8:23 PM  Result Value Ref Range   Order Confirmation      ORDER PROCESSED BY BLOOD BANK Performed at [facility] Lab, [address] [place], [place] [address]   Type and screen Ordered by PROVIDER DEFAULT     Status: None (Preliminary result)   Collection Time: 03/31/23  8:45 PM  Result Value Ref Range   ABO/RH(D) O POS    Antibody Screen NEG    Sample Expiration 04/03/2023,2359    Unit Number [id number]    Blood Component Type RED CELLS,LR    Unit division 00    Status of Unit ISSUED    Transfusion Status OK TO TRANSFUSE    Crossmatch Result      Compatible Performed at [facility], [address], [place], [place] [address]    No results found.  Pending Labs Unresulted Labs (From admission, onward)     Start     Ordered   04/01/23 1000  CBC  Now then every 6 hours,   R (with TIMED occurrences)     Question:  Release to patient  Answer:  Immediate   03/31/23 2053   04/01/23 0500  Vitamin B12  (Anemia Panel (PNL))  Tomorrow morning,   R        03/31/23 2052   04/01/23 0500  Folate  (Anemia Panel (PNL))  Tomorrow morning,   R        03/31/23 2052   Signed and Held  Magnesium  Tomorrow morning,   R        Signed and Held   Signed and Held  Phosphorus  Tomorrow morning,   R        Signed and Held   Signed and Held  Comprehensive metabolic panel  Tomorrow morning,   R       Question:  Release to patient  Answer:  Immediate   Signed and Held   Signed and Held  CBC  Tomorrow morning,   R       Question:  Release to patient  Answer:  Immediate   Signed and Held            Vitals/Pain Today's Vitals   03/31/23 2245 03/31/23 2251 03/31/23 2300 03/31/23 2346  BP: (!) 143/84 118/84 139/79 (!) 147/76  Pulse: 73 84 89 66  Resp: 18 18 (!) 21 (!) 23   Temp:  97.8 F (36.6 C)  97.8 F (36.6 C)  TempSrc:  Oral  Oral  SpO2: 100% 100% 100% 100%  Weight:      Height:      PainSc:    0-No pain    Isolation Precautions No active isolations  Medications Medications  lidocaine-EPINEPHrine-tetracaine (LET) topical gel (has no administration in time range)  0.9 %  sodium chloride infusion (Manually program via Guardrails IV Fluids) (0 mLs Intravenous Hold 03/31/23 2147)  0.9 %  sodium chloride infusion (Manually program via Guardrails IV Fluids) (0 mLs Intravenous Hold 03/31/23 2147)    Mobility walks     Focused Assessments Cardiac Assessment Handoff:    Lab Results  Component Value Date   CKTOTAL 150 03/31/2023   No results found for: "DDIMER" Does the Patient currently have chest pain? No    R Recommendations: See Admitting Provider Note  Report given to:   Additional Notes: 1st unit of Blood transfusing, patient has 2nd unit of blood ordered.

## 2023-03-31 NOTE — Assessment & Plan Note (Signed)
Chronic stable continue Coreg when able to tolerate and continue Lipitor 20 mg a day

## 2023-03-31 NOTE — ED Triage Notes (Addendum)
Pt arrived via [facility] from home for a varicose vein popped on right ankle and started bleeding. Per EMS, pt lost approx 100-213mL of blood. Bleeding is controlled, pt has pressure dressing on it.  Per EMS, pt c/o dizziness at scene. EMS gave NS . Pt is on eliquis.

## 2023-03-31 NOTE — Telephone Encounter (Signed)
Per Dr. Mayford Knife, spoke with the patient's daughter. Scheduled LAAO consult with Dr. Lalla Brothers 06/02/2023 at the [place] office. She was grateful for call and agreed with plan.

## 2023-03-31 NOTE — Subjective & Objective (Signed)
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

## 2023-03-31 NOTE — Assessment & Plan Note (Addendum)
Allow permissive hypertension for tonight Once BP allows would restart Coreg at 3.125 mg

## 2023-03-31 NOTE — ED Notes (Addendum)
Pt denies any s/s of transfusion rxn. Pt's vitals stable. Pt AAOx4. Pt in no acute distress.

## 2023-03-31 NOTE — Assessment & Plan Note (Addendum)
Hold Eliquis for tonight given bleeding.  Restart Coreg when able Continue amiodarone

## 2023-03-31 NOTE — Assessment & Plan Note (Signed)
Transfuse 2 units and follow CBC

## 2023-04-01 DIAGNOSIS — Z96652 Presence of left artificial knee joint: Secondary | ICD-10-CM | POA: Diagnosis not present

## 2023-04-01 DIAGNOSIS — N182 Chronic kidney disease, stage 2 (mild): Secondary | ICD-10-CM | POA: Diagnosis not present

## 2023-04-01 DIAGNOSIS — S91331A Puncture wound without foreign body, right foot, initial encounter: Secondary | ICD-10-CM | POA: Diagnosis not present

## 2023-04-01 DIAGNOSIS — D62 Acute posthemorrhagic anemia: Secondary | ICD-10-CM | POA: Diagnosis not present

## 2023-04-01 DIAGNOSIS — Z7901 Long term (current) use of anticoagulants: Secondary | ICD-10-CM | POA: Diagnosis not present

## 2023-04-01 DIAGNOSIS — I2510 Atherosclerotic heart disease of native coronary artery without angina pectoris: Secondary | ICD-10-CM | POA: Diagnosis not present

## 2023-04-01 DIAGNOSIS — F0390 Unspecified dementia without behavioral disturbance: Secondary | ICD-10-CM | POA: Diagnosis not present

## 2023-04-01 DIAGNOSIS — D649 Anemia, unspecified: Secondary | ICD-10-CM | POA: Diagnosis not present

## 2023-04-01 DIAGNOSIS — I5032 Chronic diastolic (congestive) heart failure: Secondary | ICD-10-CM | POA: Diagnosis not present

## 2023-04-01 DIAGNOSIS — I130 Hypertensive heart and chronic kidney disease with heart failure and stage 1 through stage 4 chronic kidney disease, or unspecified chronic kidney disease: Secondary | ICD-10-CM | POA: Diagnosis not present

## 2023-04-01 LAB — CBC
HCT: 30.3 % — ABNORMAL LOW (ref 39.0–52.0)
Hemoglobin: 9.2 g/dL — ABNORMAL LOW (ref 13.0–17.0)
MCH: 24.8 pg — ABNORMAL LOW (ref 26.0–34.0)
MCHC: 30.4 g/dL (ref 30.0–36.0)
MCV: 81.7 fL (ref 80.0–100.0)
Platelets: 275 10*3/uL (ref 150–400)
RBC: 3.71 MIL/uL — ABNORMAL LOW (ref 4.22–5.81)
RDW: 17.1 % — ABNORMAL HIGH (ref 11.5–15.5)
WBC: 6.6 10*3/uL (ref 4.0–10.5)
nRBC: 0 % (ref 0.0–0.2)

## 2023-04-01 LAB — MAGNESIUM: Magnesium: 1.9 mg/dL (ref 1.7–2.4)

## 2023-04-01 LAB — PHOSPHORUS: Phosphorus: 3.4 mg/dL (ref 2.5–4.6)

## 2023-04-01 LAB — VITAMIN B12: Vitamin B-12: 156 pg/mL — ABNORMAL LOW (ref 180–914)

## 2023-04-01 LAB — PREPARE RBC (CROSSMATCH)

## 2023-04-01 LAB — FOLATE: Folate: 9.1 ng/mL (ref >5.9)

## 2023-04-01 MED ORDER — SODIUM CHLORIDE 0.9% FLUSH
3.0000 mL | Freq: Two times a day (BID) | INTRAVENOUS | Status: DC
  Administered 2023-04-01 (×2): 3 mL via INTRAVENOUS

## 2023-04-01 MED ORDER — ACETAMINOPHEN 325 MG PO TABS: 650.0000 mg | ORAL_TABLET | Freq: Four times a day (QID) | ORAL | Status: DC | PRN

## 2023-04-01 MED ORDER — SODIUM CHLORIDE 0.9% FLUSH: 3.0000 mL | INTRAVENOUS | Status: DC | PRN

## 2023-04-01 MED ORDER — HYDROCODONE-ACETAMINOPHEN 5-325 MG PO TABS: 1.0000 | ORAL_TABLET | ORAL | Status: DC | PRN

## 2023-04-01 MED ORDER — ACETAMINOPHEN 650 MG RE SUPP: 650.0000 mg | Freq: Four times a day (QID) | RECTAL | Status: DC | PRN

## 2023-04-01 MED ORDER — AMIODARONE HCL 200 MG PO TABS
200.0000 mg | ORAL_TABLET | Freq: Every day | ORAL | Status: DC
  Administered 2023-04-01: 200 mg via ORAL
  Filled 2023-04-01: qty 1

## 2023-04-01 MED ORDER — ATORVASTATIN CALCIUM 10 MG PO TABS
20.0000 mg | ORAL_TABLET | Freq: Every day | ORAL | Status: DC
  Administered 2023-04-01: 20 mg via ORAL
  Filled 2023-04-01: qty 2

## 2023-04-01 MED ORDER — SODIUM CHLORIDE 0.9 % IV SOLN: 250.0000 mL | INTRAVENOUS | Status: DC | PRN

## 2023-04-01 MED ORDER — CARVEDILOL 3.125 MG PO TABS
3.1250 mg | ORAL_TABLET | Freq: Every day | ORAL | Status: DC
  Administered 2023-04-01: 3.125 mg via ORAL
  Filled 2023-04-01: qty 1

## 2023-04-01 NOTE — Discharge Summary (Signed)
[facility]  Physician Discharge Summary   Patient ID: [name] MRN: [id number] DOB/AGE: August 30, 1940 82 y.o.  Admit date: 03/31/2023 Discharge date: 04/01/2023    PCP: Renford Dills, MD  DISCHARGE DIAGNOSES:    Symptomatic anemia   ABLA (acute blood loss anemia), symptomatic   HTN (hypertension)   Hyperlipidemia LDL goal <70   CAD (coronary artery disease)   Persistent atrial fibrillation (HCC)   Thoracic aortic aneurysm (HCC)   Chronic diastolic CHF (congestive heart failure) (HCC)   RECOMMENDATIONS FOR OUTPATIENT FOLLOW UP: Patient to follow-up with his outpatient providers including vascular surgery.  According to daughter this is all in the works.   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  He lost a significant amount of blood.  Evaluation in the emergency department revealed significant anemia.  He was hospitalized for further management.   HOSPITAL COURSE:  Patient presented with bleeding from his right ankle as well as fatigue.  Hemoglobin was noted to be 7.4.  This was a drop from his usual baseline.  He was transfused 2 units of PRBC with improvement in hemoglobin.  Bleeding from the right ankle had stopped.  Reevaluated this morning and no bleeding is noted.  Patient feels better in terms of his fatigue.  He can resume his Eliquis from tonight.  He is on alternative dosing per his cardiologist.  Due to recent frequent episodes of bleeding he is also being considered for Watchman device in the near future.  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  His other medical issues are all stable as outlined in the H&P.  Okay for discharge today.   Discussed with his daughter.     PERTINENT LABS:  The results of significant diagnostics from this hospitalization (including imaging, microbiology, ancillary and laboratory) are listed below for reference.    Microbiology: Recent Results (from the past 240 hour(s))  Blood culture (routine x 2)     Status: None   Collection Time: 03/25/23  5:05 PM   Specimen: BLOOD  Result Value Ref Range Status   Specimen Description BLOOD RIGHT ANTECUBITAL  Final   Special Requests   Final    BOTTLES DRAWN AEROBIC AND ANAEROBIC Blood Culture adequate volume   Culture   Final    NO GROWTH 5 DAYS Performed at [facility], [address], [place], [place] [address]    Report Status 03/30/2023 FINAL  Final  Blood culture (routine x 2)     Status: None   Collection Time: 03/25/23  5:14 PM   Specimen: BLOOD  Result Value Ref Range Status   Specimen Description BLOOD LEFT ANTECUBITAL  Final   Special Requests   Final    BOTTLES DRAWN AEROBIC ONLY Blood Culture results may not be optimal due to an inadequate volume of blood received in culture bottles   Culture   Final    NO GROWTH 5 DAYS Performed at [facility], [address] [place], [place] [address]    Report Status 03/30/2023 FINAL  Final     Labs:   Basic Metabolic Panel: Recent Labs  Lab 03/31/23 1721 04/01/23 0622  NA 139  --   K 3.9  --   CL  111  --   CO2 24  --   GLUCOSE 101*  --   BUN 13  --   CREATININE 1.12  --   CALCIUM 7.6*  --   MG 1.7 1.9  PHOS 3.4 3.4   Liver Function Tests: Recent Labs  Lab 03/31/23 1721  AST 27  ALT 25  ALKPHOS 46  BILITOT 0.3  PROT 4.9*  ALBUMIN 2.4*    CBC: Recent Labs  Lab 03/31/23 1721 04/01/23 0621  WBC 8.0 6.6  NEUTROABS 6.8  --   HGB 7.4* 9.2*  HCT 24.6* 30.3*  MCV 86.3 81.7  PLT 273 275   Cardiac Enzymes: Recent Labs  Lab 03/31/23 1721  CKTOTAL 150     DISCHARGE EXAMINATION: Vitals:   04/01/23 0329 04/01/23 0424 04/01/23 0545 04/01/23 0842   BP: (!) 148/69 134/74 (!) 144/67 (!) 170/76  Pulse: 68 69 67 61  Resp: 18 18 18 18   Temp: 98 F (36.7 C) 98.2 F (36.8 C) 97.7 F (36.5 C) 98 F (36.7 C)  TempSrc: Oral  Oral Oral  SpO2: 100% 100% 97% 97%  Weight:      Height:       General appearance: Awake alert.  In no distress Resp: Clear to auscultation bilaterally.  Normal effort Cardio: S1-S2 is normal regular.  No S3-S4.  No rubs murmurs or bruit GI: Abdomen is soft.  Nontender nondistended.  Bowel sounds are present normal.  No masses organomegaly No bleeding noted around the right ankle area.  DISPOSITION: Home  Discharge Instructions     Call MD for:  difficulty breathing, headache or visual disturbances   Complete by: As directed    Call MD for:  extreme fatigue   Complete by: As directed    Call MD for:  persistant dizziness or light-headedness   Complete by: As directed    Call MD for:  persistant nausea and vomiting   Complete by: As directed    Call MD for:  severe uncontrolled pain   Complete by: As directed    Call MD for:  temperature >100.4   Complete by: As directed    Diet - low sodium heart healthy   Complete by: As directed    Discharge instructions   Complete by: As directed    Please take your medications as prescribed.  You may resume your Eliquis from tonight.  Seek attention if bleeding recurs.  You were cared for by a hospitalist during your hospital stay. If you have any questions about your discharge medications or the care you received while you were in the hospital after you are discharged, you can call the unit and asked to speak with the hospitalist on call if the hospitalist that took care of you is not available. Once you are discharged, your primary care physician will handle any further medical issues. Please note that NO REFILLS for any discharge medications will be authorized once you are discharged, as it is imperative that you return to your primary care physician (or establish a  relationship with a primary care physician if you do not have one) for your aftercare needs so that they can reassess your need for medications and monitor your lab values. If you do not have a primary care physician, you can call [phone] for a physician referral.   Increase activity slowly   Complete by: As directed          Allergies as of 04/01/2023   No Known Allergies  Medication List     STOP taking these medications    amoxicillin-clavulanate 875-125 MG tablet Commonly known as: AUGMENTIN       TAKE these medications    acetaminophen 325 MG tablet Commonly known as: Tylenol Take 2 tablets (650 mg total) by mouth every 6 (six) hours as needed for moderate pain.   amiodarone 200 MG tablet Commonly known as: PACERONE Take 200 mg by mouth daily.   apixaban 2.5 MG Tabs tablet Commonly known as: ELIQUIS Take 1 tablet (2.5 mg total) by mouth 2 (two) times daily. What changed:  how much to take when to take this additional instructions   atorvastatin 20 MG tablet Commonly known as: LIPITOR TAKE 1 TABLET(20 MG) BY MOUTH DAILY   BOSWELLIA PO Take 1,200 mg by mouth daily.   carvedilol 3.125 MG tablet Commonly known as: COREG Take 3.125 mg by mouth daily.   Fish Oil 1200 MG Caps Take 1,200 mg by mouth daily.   furosemide 20 MG tablet Commonly known as: LASIX Take 1 tablet (20 mg total) by mouth daily. What changed: when to take this   Glucosamine Chondroitin Triple Tabs Take 1 tablet by mouth daily.   lidocaine 5 % Commonly known as: LIDODERM Place 1 patch onto the skin daily.   multivitamin with minerals Tabs tablet Take 1 tablet by mouth daily.           TOTAL DISCHARGE TIME: 35 minutes  Thora Scherman [employer] on [email or web address]  04/02/2023, 10:04 AM

## 2023-04-01 NOTE — Plan of Care (Signed)
  Problem: Education: Goal: Knowledge of General Education information will improve Description: Including pain rating scale, medication(s)/side effects and non-pharmacologic comfort measures 04/01/2023 1630 by Olga Millers, LPN Outcome: Progressing 04/01/2023 1630 by Olga Millers, LPN Outcome: Progressing   Problem: Health Behavior/Discharge Planning: Goal: Ability to manage health-related needs will improve 04/01/2023 1630 by Olga Millers, LPN Outcome: Progressing 04/01/2023 1630 by Olga Millers, LPN Outcome: Progressing   Problem: Clinical Measurements: Goal: Ability to maintain clinical measurements within normal limits will improve 04/01/2023 1630 by Olga Millers, LPN Outcome: Progressing 04/01/2023 1630 by Olga Millers, LPN Outcome: Progressing Goal: Will remain free from infection 04/01/2023 1630 by Olga Millers, LPN Outcome: Progressing 04/01/2023 1630 by Olga Millers, LPN Outcome: Progressing Goal: Diagnostic test results will improve 04/01/2023 1630 by Olga Millers, LPN Outcome: Progressing 04/01/2023 1630 by Olga Millers, LPN Outcome: Progressing Goal: Respiratory complications will improve 04/01/2023 1630 by Olga Millers, LPN Outcome: Progressing 04/01/2023 1630 by Olga Millers, LPN Outcome: Progressing Goal: Cardiovascular complication will be avoided 04/01/2023 1630 by Olga Millers, LPN Outcome: Progressing 04/01/2023 1630 by Olga Millers, LPN Outcome: Progressing   Problem: Activity: Goal: Risk for activity intolerance will decrease 04/01/2023 1630 by Olga Millers, LPN Outcome: Progressing 04/01/2023 1630 by Olga Millers, LPN Outcome: Progressing   Problem: Nutrition: Goal: Adequate nutrition will be maintained 04/01/2023 1630 by Olga Millers, LPN Outcome: Progressing 04/01/2023 1630 by Olga Millers, LPN Outcome: Progressing   Problem: Coping: Goal: Level of anxiety will decrease 04/01/2023 1630 by Olga Millers, LPN Outcome:  Progressing 04/01/2023 1630 by Olga Millers, LPN Outcome: Progressing   Problem: Elimination: Goal: Will not experience complications related to bowel motility 04/01/2023 1630 by Olga Millers, LPN Outcome: Progressing 04/01/2023 1630 by Olga Millers, LPN Outcome: Progressing Goal: Will not experience complications related to urinary retention 04/01/2023 1630 by Olga Millers, LPN Outcome: Progressing 04/01/2023 1630 by Olga Millers, LPN Outcome: Progressing   Problem: Pain Managment: Goal: General experience of comfort will improve 04/01/2023 1630 by Olga Millers, LPN Outcome: Progressing 04/01/2023 1630 by Olga Millers, LPN Outcome: Progressing   Problem: Safety: Goal: Ability to remain free from injury will improve 04/01/2023 1630 by Olga Millers, LPN Outcome: Progressing 04/01/2023 1630 by Olga Millers, LPN Outcome: Progressing   Problem: Skin Integrity: Goal: Risk for impaired skin integrity will decrease 04/01/2023 1630 by Olga Millers, LPN Outcome: Progressing 04/01/2023 1630 by Olga Millers, LPN Outcome: Progressing

## 2023-04-01 NOTE — Plan of Care (Signed)
Problem: Education: Goal: Knowledge of General Education information will improve Description: Including pain rating scale, medication(s)/side effects and non-pharmacologic comfort measures 04/01/2023 1630 by Olga Millers, LPN Outcome: Progressing 04/01/2023 1630 by Olga Millers, LPN Outcome: Progressing   Problem: Health Behavior/Discharge Planning: Goal: Ability to manage health-related needs will improve 04/01/2023 1630 by Olga Millers, LPN Outcome: Progressing 04/01/2023 1630 by Olga Millers, LPN Outcome: Progressing   Problem: Clinical Measurements: Goal: Ability to maintain clinical measurements within normal limits will improve 04/01/2023 1631 by Olga Millers, LPN Outcome: Progressing 04/01/2023 1630 by Olga Millers, LPN Outcome: Progressing 04/01/2023 1630 by Olga Millers, LPN Outcome: Progressing Goal: Will remain free from infection 04/01/2023 1631 by Olga Millers, LPN Outcome: Progressing 04/01/2023 1630 by Olga Millers, LPN Outcome: Progressing 04/01/2023 1630 by Olga Millers, LPN Outcome: Progressing Goal: Diagnostic test results will improve 04/01/2023 1631 by Olga Millers, LPN Outcome: Progressing 04/01/2023 1630 by Olga Millers, LPN Outcome: Progressing 04/01/2023 1630 by Olga Millers, LPN Outcome: Progressing Goal: Respiratory complications will improve 04/01/2023 1631 by Olga Millers, LPN Outcome: Progressing 04/01/2023 1630 by Olga Millers, LPN Outcome: Progressing 04/01/2023 1630 by Olga Millers, LPN Outcome: Progressing Goal: Cardiovascular complication will be avoided 04/01/2023 1631 by Olga Millers, LPN Outcome: Progressing 04/01/2023 1630 by Olga Millers, LPN Outcome: Progressing 04/01/2023 1630 by Olga Millers, LPN Outcome: Progressing   Problem: Activity: Goal: Risk for activity intolerance will decrease 04/01/2023 1631 by Olga Millers, LPN Outcome: Progressing 04/01/2023 1630 by Olga Millers, LPN Outcome: Progressing 04/01/2023  1630 by Olga Millers, LPN Outcome: Progressing   Problem: Nutrition: Goal: Adequate nutrition will be maintained 04/01/2023 1631 by Olga Millers, LPN Outcome: Progressing 04/01/2023 1630 by Olga Millers, LPN Outcome: Progressing 04/01/2023 1630 by Olga Millers, LPN Outcome: Progressing   Problem: Coping: Goal: Level of anxiety will decrease 04/01/2023 1630 by Olga Millers, LPN Outcome: Progressing 04/01/2023 1630 by Olga Millers, LPN Outcome: Progressing   Problem: Elimination: Goal: Will not experience complications related to bowel motility 04/01/2023 1630 by Olga Millers, LPN Outcome: Progressing 04/01/2023 1630 by Olga Millers, LPN Outcome: Progressing Goal: Will not experience complications related to urinary retention 04/01/2023 1630 by Olga Millers, LPN Outcome: Progressing 04/01/2023 1630 by Olga Millers, LPN Outcome: Progressing   Problem: Pain Managment: Goal: General experience of comfort will improve 04/01/2023 1630 by Olga Millers, LPN Outcome: Progressing 04/01/2023 1630 by Olga Millers, LPN Outcome: Progressing   Problem: Safety: Goal: Ability to remain free from injury will improve 04/01/2023 1630 by Olga Millers, LPN Outcome: Progressing 04/01/2023 1630 by Olga Millers, LPN Outcome: Progressing   Problem: Skin Integrity: Goal: Risk for impaired skin integrity will decrease 04/01/2023 1630 by Olga Millers, LPN Outcome: Progressing 04/01/2023 1630 by Olga Millers, LPN Outcome: Progressing   Problem: Education: Goal: Knowledge of General Education information will improve Description: Including pain rating scale, medication(s)/side effects and non-pharmacologic comfort measures 04/01/2023 1630 by Olga Millers, LPN Outcome: Progressing 04/01/2023 1630 by Olga Millers, LPN Outcome: Progressing   Problem: Health Behavior/Discharge Planning: Goal: Ability to manage health-related needs will improve 04/01/2023 1630 by Olga Millers, LPN Outcome:  Progressing 04/01/2023 1630 by Olga Millers, LPN Outcome: Progressing   Problem: Clinical Measurements: Goal: Ability to maintain clinical measurements within normal limits will improve 04/01/2023 1631 by Olga Millers, LPN Outcome: Progressing 04/01/2023 1630  by Olga Millers, LPN Outcome: Progressing 04/01/2023 1630 by Olga Millers, LPN Outcome: Progressing Goal: Will remain free from infection 04/01/2023 1631 by Olga Millers, LPN Outcome: Progressing 04/01/2023 1630 by Olga Millers, LPN Outcome: Progressing 04/01/2023 1630 by Olga Millers, LPN Outcome: Progressing Goal: Diagnostic test results will improve 04/01/2023 1631 by Olga Millers, LPN Outcome: Progressing 04/01/2023 1630 by Olga Millers, LPN Outcome: Progressing 04/01/2023 1630 by Olga Millers, LPN Outcome: Progressing Goal: Respiratory complications will improve 04/01/2023 1631 by Olga Millers, LPN Outcome: Progressing 04/01/2023 1630 by Olga Millers, LPN Outcome: Progressing 04/01/2023 1630 by Olga Millers, LPN Outcome: Progressing Goal: Cardiovascular complication will be avoided 04/01/2023 1631 by Olga Millers, LPN Outcome: Progressing 04/01/2023 1630 by Olga Millers, LPN Outcome: Progressing 04/01/2023 1630 by Olga Millers, LPN Outcome: Progressing   Problem: Activity: Goal: Risk for activity intolerance will decrease 04/01/2023 1631 by Olga Millers, LPN Outcome: Progressing 04/01/2023 1630 by Olga Millers, LPN Outcome: Progressing 04/01/2023 1630 by Olga Millers, LPN Outcome: Progressing   Problem: Nutrition: Goal: Adequate nutrition will be maintained 04/01/2023 1631 by Olga Millers, LPN Outcome: Progressing 04/01/2023 1630 by Olga Millers, LPN Outcome: Progressing 04/01/2023 1630 by Olga Millers, LPN Outcome: Progressing   Problem: Coping: Goal: Level of anxiety will decrease 04/01/2023 1630 by Olga Millers, LPN Outcome: Progressing 04/01/2023 1630 by Olga Millers, LPN Outcome:  Progressing   Problem: Elimination: Goal: Will not experience complications related to bowel motility 04/01/2023 1630 by Olga Millers, LPN Outcome: Progressing 04/01/2023 1630 by Olga Millers, LPN Outcome: Progressing Goal: Will not experience complications related to urinary retention 04/01/2023 1630 by Olga Millers, LPN Outcome: Progressing 04/01/2023 1630 by Olga Millers, LPN Outcome: Progressing   Problem: Pain Managment: Goal: General experience of comfort will improve 04/01/2023 1630 by Olga Millers, LPN Outcome: Progressing 04/01/2023 1630 by Olga Millers, LPN Outcome: Progressing   Problem: Safety: Goal: Ability to remain free from injury will improve 04/01/2023 1630 by Olga Millers, LPN Outcome: Progressing 04/01/2023 1630 by Olga Millers, LPN Outcome: Progressing   Problem: Skin Integrity: Goal: Risk for impaired skin integrity will decrease 04/01/2023 1630 by Olga Millers, LPN Outcome: Progressing 04/01/2023 1630 by Olga Millers, LPN Outcome: Progressing   Problem: Education: Goal: Knowledge of General Education information will improve Description: Including pain rating scale, medication(s)/side effects and non-pharmacologic comfort measures 04/01/2023 1630 by Olga Millers, LPN Outcome: Progressing 04/01/2023 1630 by Olga Millers, LPN Outcome: Progressing   Problem: Health Behavior/Discharge Planning: Goal: Ability to manage health-related needs will improve 04/01/2023 1630 by Olga Millers, LPN Outcome: Progressing 04/01/2023 1630 by Olga Millers, LPN Outcome: Progressing   Problem: Clinical Measurements: Goal: Ability to maintain clinical measurements within normal limits will improve 04/01/2023 1631 by Olga Millers, LPN Outcome: Progressing 04/01/2023 1630 by Olga Millers, LPN Outcome: Progressing 04/01/2023 1630 by Olga Millers, LPN Outcome: Progressing Goal: Will remain free from infection 04/01/2023 1631 by Olga Millers, LPN Outcome:  Progressing 04/01/2023 1630 by Olga Millers, LPN Outcome: Progressing 04/01/2023 1630 by Olga Millers, LPN Outcome: Progressing Goal: Diagnostic test results will improve 04/01/2023 1631 by Olga Millers, LPN Outcome: Progressing 04/01/2023 1630 by Olga Millers, LPN Outcome: Progressing 04/01/2023 1630 by Olga Millers, LPN Outcome: Progressing Goal: Respiratory complications will improve 04/01/2023 1631 by Olga Millers, LPN Outcome: Progressing 04/01/2023 1630  by Olga Millers, LPN Outcome: Progressing 04/01/2023 1630 by Olga Millers, LPN Outcome: Progressing Goal: Cardiovascular complication will be avoided 04/01/2023 1631 by Olga Millers, LPN Outcome: Progressing 04/01/2023 1630 by Olga Millers, LPN Outcome: Progressing 04/01/2023 1630 by Olga Millers, LPN Outcome: Progressing   Problem: Activity: Goal: Risk for activity intolerance will decrease 04/01/2023 1631 by Olga Millers, LPN Outcome: Progressing 04/01/2023 1630 by Olga Millers, LPN Outcome: Progressing 04/01/2023 1630 by Olga Millers, LPN Outcome: Progressing   Problem: Nutrition: Goal: Adequate nutrition will be maintained 04/01/2023 1631 by Olga Millers, LPN Outcome: Progressing 04/01/2023 1630 by Olga Millers, LPN Outcome: Progressing 04/01/2023 1630 by Olga Millers, LPN Outcome: Progressing   Problem: Coping: Goal: Level of anxiety will decrease 04/01/2023 1630 by Olga Millers, LPN Outcome: Progressing 04/01/2023 1630 by Olga Millers, LPN Outcome: Progressing   Problem: Elimination: Goal: Will not experience complications related to bowel motility 04/01/2023 1630 by Olga Millers, LPN Outcome: Progressing 04/01/2023 1630 by Olga Millers, LPN Outcome: Progressing Goal: Will not experience complications related to urinary retention 04/01/2023 1630 by Olga Millers, LPN Outcome: Progressing 04/01/2023 1630 by Olga Millers, LPN Outcome: Progressing   Problem: Pain Managment: Goal: General  experience of comfort will improve 04/01/2023 1630 by Olga Millers, LPN Outcome: Progressing 04/01/2023 1630 by Olga Millers, LPN Outcome: Progressing   Problem: Safety: Goal: Ability to remain free from injury will improve 04/01/2023 1630 by Olga Millers, LPN Outcome: Progressing 04/01/2023 1630 by Olga Millers, LPN Outcome: Progressing   Problem: Skin Integrity: Goal: Risk for impaired skin integrity will decrease 04/01/2023 1630 by Olga Millers, LPN Outcome: Progressing 04/01/2023 1630 by Olga Millers, LPN Outcome: Progressing

## 2023-04-01 NOTE — Plan of Care (Signed)

## 2023-04-02 LAB — TYPE AND SCREEN
ABO/RH(D): O POS
Antibody Screen: NEGATIVE
Unit division: 0
Unit division: 0

## 2023-04-02 LAB — BPAM RBC
Blood Product Expiration Date: 202409302359
Blood Product Expiration Date: 202410112359
ISSUE DATE / TIME: 202409112229
ISSUE DATE / TIME: 202409120246
Unit Type and Rh: 5100
Unit Type and Rh: 5100

## 2023-04-05 ENCOUNTER — Other Ambulatory Visit: Payer: Self-pay | Admitting: *Deleted

## 2023-04-05 DIAGNOSIS — I872 Venous insufficiency (chronic) (peripheral): Secondary | ICD-10-CM

## 2023-04-09 ENCOUNTER — Ambulatory Visit (HOSPITAL_COMMUNITY): Payer: Medicare HMO

## 2023-04-14 ENCOUNTER — Ambulatory Visit (HOSPITAL_COMMUNITY)
Admission: RE | Admit: 2023-04-14 | Discharge: 2023-04-14 | Disposition: A | Discharge status: Home / Self Care | Payer: Medicare HMO | Source: Ambulatory Visit | Attending: Vascular Surgery | Admitting: Vascular Surgery

## 2023-04-14 DIAGNOSIS — I872 Venous insufficiency (chronic) (peripheral): Secondary | ICD-10-CM | POA: Diagnosis not present

## 2023-04-22 DIAGNOSIS — Z471 Aftercare following joint replacement surgery: Secondary | ICD-10-CM | POA: Diagnosis not present

## 2023-04-22 DIAGNOSIS — M1711 Unilateral primary osteoarthritis, right knee: Secondary | ICD-10-CM | POA: Diagnosis not present

## 2023-04-22 DIAGNOSIS — Z96652 Presence of left artificial knee joint: Secondary | ICD-10-CM | POA: Diagnosis not present

## 2023-04-23 ENCOUNTER — Encounter: Payer: Medicare HMO | Admitting: Vascular Surgery

## 2023-04-26 ENCOUNTER — Ambulatory Visit: Payer: Medicare HMO | Admitting: Physician Assistant

## 2023-04-26 ENCOUNTER — Encounter: Payer: Self-pay | Admitting: Physician Assistant

## 2023-04-26 VITALS — BP 187/81 | HR 57 | Temp 97.9°F | Resp 20 | Ht 74.0 in | Wt 153.6 lb

## 2023-04-26 DIAGNOSIS — I8393 Asymptomatic varicose veins of bilateral lower extremities: Secondary | ICD-10-CM

## 2023-04-26 DIAGNOSIS — I872 Venous insufficiency (chronic) (peripheral): Secondary | ICD-10-CM | POA: Diagnosis not present

## 2023-04-26 NOTE — Progress Notes (Signed)
[facility] [place]   Reason for referral: Swollen right > left leg  History of Present Illness  [name] is a 82 y.o. male who presents with chief complaint: swollen leg.  Patient notes, onset of swelling years ago > 3 years was his last visit to our office ago, associated with chronic venous insufficiency with bleeding episodes. The patient has had no history of DVT, positive history of varicose vein, positive history of venous stasis ulcers, no history of  Lymphedema and positive history of skin changes in lower legs.  There is unknown family history of venous disorders.  The patient has used compression stockings in the past.   He was seen by Dr. Edilia Bo in 2021 for recurrent bleeding episodes from the left leg. This has been happening for over a year. He had required transportation emergency department for bleeding via EMS.  He did have sclerotherapy on 11/17/2019 and on 04/08/2020 he underwent excision of the ulcer of the left leg with ligation of the underlying perforator and excision of small varicose veins there was subcutaneous.    He is accompanied by his daughter for exam and recommendation on how to prevent edema and re current bleeding control.  He was hospitalized 04/01/23 for anemia requiring transfusion and bleeding from a varicose vein on the right ankle.  He is medically managed on Eliquis for Afib.  No active bleeding since then and  no open wounds.  Hemoglobin was noted to be 7.4. This was a drop from his usual baseline. He was transfused 2 units of PRBC with improvement in hemoglobin.      Past Medical History:  Diagnosis Date   Ascending aorta dilatation (HCC)    44mm by chest CT angio 04/2018   CAD (coronary artery disease)    a. 05/2006 Abnl stress test w/ 2-37mm ST dep; b. 05/2006 Cath/PCI: LM nl, LAD 30-40ost, 20-30p, 90d (small), D1 nl, D2 nl, LCX nl, OM1/2 nl, RCA Ca2+, 60m/d (3.5x16 Liberty BMS & 3.5x12 Liberty BMS), EF 50%.   Chronic  diastolic CHF (congestive heart failure) (HCC)    a. 10/2017 Echo: EF 50-55%, mild AI, sev dil LA, mod dil RA.   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 CHA2DS2VASc 5-->Eliquis; b. Prev WCT on flecainide;  c. 12/2017 s/p DCCV.   Pneumothorax    Prolonged Q-T interval on ECG 06/22/2022   Ventricular tachycardia (HCC)    a. possibly proarrhythmia from flecainide.    Past Surgical History:  Procedure Laterality Date   CARDIOVERSION N/A 12/23/2017   Procedure: CARDIOVERSION;  Surgeon: Quintella Reichert, MD;  Location: [facility] ENDOSCOPY;  Service: Cardiovascular;  Laterality: N/A;   CARDIOVERSION N/A 03/08/2018   Procedure: CARDIOVERSION;  Surgeon: Quintella Reichert, MD;  Location: [facility] ENDOSCOPY;  Service: Cardiovascular;  Laterality: N/A;   INGUINAL HERNIA REPAIR     x2   left total hip surgery     TEE WITHOUT CARDIOVERSION N/A 03/08/2018   Procedure: TRANSESOPHAGEAL ECHOCARDIOGRAM (TEE);  Surgeon: Quintella Reichert, MD;  Location: [facility] ENDOSCOPY;  Service: Cardiovascular;  Laterality: N/A;   TOTAL HIP ARTHROPLASTY Right 08/19/2020   Procedure: TOTAL HIP ARTHROPLASTY ANTERIOR APPROACH;  Surgeon: Ollen Gross, MD;  Location: [facility] ORS;  Service: Orthopedics;  Laterality: Right;    TOTAL KNEE ARTHROPLASTY Left 02/28/2018   Procedure: LEFT TOTAL KNEE ARTHROPLASTY;  Surgeon: Ollen Gross, MD;  Location:  [facility] ORS;  Service: Orthopedics;  Laterality: Left;   VEIN LIGATION AND STRIPPING Left 04/08/2020   Procedure: LEFT LOWER EXTREMITY VEIN LIGATION AND EXCISION OF ULCER;  Surgeon: Chuck Hint, MD;  Location: [facility] OR;  Service: Vascular;  Laterality: Left;    Social History   Socioeconomic History   Marital status: Single    Spouse name: Not on file   Number of children: Not on file   Years of education: Not on file   Highest education level: Not on file  Occupational History    Occupation: retired  Tobacco Use   Smoking status: Never   Smokeless tobacco: Never  Vaping Use   Vaping status: Never Used  Substance and Sexual Activity   Alcohol use: Not Currently    Comment: occsaoinal   Drug use: Not Currently    Types: Other-see comments   Sexual activity: Not Currently  Other Topics Concern   Not on file  Social History Narrative   ** Merged History Encounter **       Lives in [place] by himself but 2 dtrs nearby and help out when necessary.   Social Determinants of Health   Financial Resource Strain: Not on file  Food Insecurity: Not on file  Transportation Needs: Not on file  Physical Activity: Not on file  Stress: Not on file  Social Connections: Unknown (11/28/2021)   Received from [facility], [facility]   Social Network    Social Network: Not on file  Intimate Partner Violence: Unknown (10/20/2021)   Received from [facility], [facility]   HITS    Physically Hurt: Not on file    Insult or Talk Down To: Not on file    Threaten Physical Harm: Not on file    Scream or Curse: Not on file    Family History  Problem Relation Age of Onset   Coronary artery disease Other     Current Outpatient Medications on File Prior to Visit  Medication Sig Dispense Refill   acetaminophen (TYLENOL) 325 MG tablet Take 2 tablets (650 mg total) by mouth every 6 (six) hours as needed for moderate pain. 30 tablet 0   amiodarone (PACERONE) 200 MG tablet Take 200 mg by mouth daily.     apixaban (ELIQUIS) 2.5 MG TABS tablet Take 1 tablet (2.5 mg total) by mouth 2 (two) times daily. (Patient taking differently: Take 2.5-5 mg by mouth See admin instructions. Instructed to take this medication once a day and alternate taking 5mg  one day, 2.5mg  the next day.)     atorvastatin (LIPITOR) 20 MG tablet TAKE 1 TABLET(20 MG) BY MOUTH DAILY 90 tablet 0   Boswellia Serrata (BOSWELLIA PO) Take 1,200 mg by mouth daily.     carvedilol (COREG) 3.125 MG tablet Take 3.125 mg by  mouth daily.     furosemide (LASIX) 20 MG tablet Take 1 tablet (20 mg total) by mouth daily. (Patient taking differently: Take 20 mg by mouth every other day.)     lidocaine (LIDODERM) 5 % Place 1 patch onto the skin daily.     Misc Natural Products (GLUCOSAMINE CHONDROITIN TRIPLE) TABS Take 1 tablet by mouth daily.     Multiple Vitamin (MULTIVITAMIN WITH MINERALS) TABS tablet Take 1 tablet by mouth daily.     Omega-3 Fatty Acids (FISH OIL) 1200 MG CAPS Take 1,200 mg by mouth daily.     No current facility-administered medications on file prior to visit.    Allergies as of 04/26/2023   (  No Known Allergies)     ROS:   General:  No weight loss, Fever, chills  HEENT: No recent headaches, no nasal bleeding, no visual changes, no sore throat  Neurologic: No dizziness, blackouts, seizures. No recent symptoms of stroke or mini- stroke. No recent episodes of slurred speech, or temporary blindness.  Cardiac: No recent episodes of chest pain/pressure, no shortness of breath at rest.  No shortness of breath with exertion.  Denies history of atrial fibrillation or irregular heartbeat  Vascular: No history of rest pain in feet.  No history of claudication.  No history of non-healing ulcer, No history of DVT   Pulmonary: No home oxygen, no productive cough, no hemoptysis,  No asthma or wheezing  Musculoskeletal:  [ ]  Arthritis, [ ]  Low back pain,  [ ]  Joint pain  Hematologic:No history of hypercoagulable state.  No history of easy bleeding.  No history of anemia  Gastrointestinal: No hematochezia or melena,  No gastroesophageal reflux, no trouble swallowing  Urinary: [ ]  chronic Kidney disease, [ ]  on HD - [ ]  MWF or [ ]  TTHS, [ ]  Burning with urination, [ ]  Frequent urination, [ ]  Difficulty urinating;   Skin: No rashes  Psychological: No history of anxiety,  No history of depression  Physical Examination  Vitals:   04/26/23 0940  BP: (!) 187/81  Pulse: (!) 57  Resp: 20  Temp: 97.9  F (36.6 C)  TempSrc: Temporal  SpO2: 97%  Weight: 153 lb 9.6 oz (69.7 kg)  Height: 6\' 2"  (1.88 m)    Body mass index is 19.72 kg/m.  General:  Alert and oriented, no acute distress HEENT: Normal Neck: No bruit or JVD Pulmonary: Clear to auscultation bilaterally Cardiac: Regular Rate and Rhythm without murmur Abdomen: Soft, non-tender, non-distended, no mass, no scars Skin: No rash         Extremity Pulses:   radial,  femoral, brisk multiphasic doppler dorsalis pedis, posterior tibial pulses bilaterally Musculoskeletal: No deformity or edema  Neurologic: Upper and lower extremity motor 5/5 and symmetric  DATA: ABI Findings:  +---------+------------------+-----+---------+---------------+  Right   Rt Pressure (mmHg)IndexWaveform Comment          +---------+------------------+-----+---------+---------------+  Brachial 129                    triphasic                 +---------+------------------+-----+---------+---------------+  PTA                            triphasicnoncompressible  +---------+------------------+-----+---------+---------------+  DP                             biphasic noncompressible  +---------+------------------+-----+---------+---------------+  Great Toe120               0.91 Normal                    +---------+------------------+-----+---------+---------------+   +---------+------------------+-----+---------+---------------+  Left    Lt Pressure (mmHg)IndexWaveform Comment          +---------+------------------+-----+---------+---------------+  Brachial 132                                              +---------+------------------+-----+---------+---------------+  PTA  triphasicnoncompressible  +---------+------------------+-----+---------+---------------+  DP                             triphasicnoncompressible   +---------+------------------+-----+---------+---------------+  Great Toe161               1.22                           +---------+------------------+-----+---------+---------------+   +-------+-----------+-----------+------------+------------+  ABI/TBIToday's ABIToday's TBIPrevious ABIPrevious TBI  +-------+-----------+-----------+------------+------------+  Right [place]         0.91                                 +-------+-----------+-----------+------------+------------+  Left  [place]         1.22                                 +-------+-----------+-----------+------------+------------+     Arterial wall calcification precludes accurate ankle pressures and ABIs.    Summary:  Right: Resting right ankle-brachial index indicates noncompressible right  lower extremity arteries. The right toe-brachial index is normal.   Left: Resting left ankle-brachial index indicates noncompressible left  lower extremity arteries. The left toe-brachial index is normal.   Venous Reflux Times  +--------------+---------+------+-----------+------------+--------+  RIGHT        Reflux NoRefluxReflux TimeDiameter cmsComments                          Yes                                   +--------------+---------+------+-----------+------------+--------+  CFV                    yes   >1 second                       +--------------+---------+------+-----------+------------+--------+  FV mid                  yes   >1 second                       +--------------+---------+------+-----------+------------+--------+  Popliteal              yes   >1 second                       +--------------+---------+------+-----------+------------+--------+  GSV at SFJ              yes    >500 ms      0.61              +--------------+---------+------+-----------+------------+--------+  GSV prox thigh          yes    >500 ms      0.59               +--------------+---------+------+-----------+------------+--------+  GSV mid thigh           yes    >500 ms      0.32              +--------------+---------+------+-----------+------------+--------+  GSV dist thigh          yes    >  500 ms      0.34              +--------------+---------+------+-----------+------------+--------+  GSV at knee             yes    >500 ms      0.40              +--------------+---------+------+-----------+------------+--------+  GSV prox calf           yes    >500 ms      0.23              +--------------+---------+------+-----------+------------+--------+  GSV mid calf            yes    >500 ms      0.31              +--------------+---------+------+-----------+------------+--------+  SSV Pop Fossa no                            0.26              +--------------+---------+------+-----------+------------+--------+     Summary:  Right:  - No evidence of deep vein thrombosis from the common femoral through the  popliteal veins.  - No evidence of superficial venous thrombosis.    - The deep venous system demonstrates significant reflux.  - The great saphenous vein is not competent.  - The small saphenous vein is competent.   Assessment/Plan:  Chronic venous insufficiency with episode of bleeding varicosities.    He has chronic deep and GSV venous reflux.  There is no current episode of bleeding and no evidence of where the bleeding occurred.  He has evidence of old wounds with well healed scars.  S/P excision of ulceration left leg and ligation of underlying perforator in 2021.   The venous study confirms chronic venous reflux with relatively small vein size in most of the GSV.  We discussed that if has recurrent bleeding they will use direst pressure and report to the ED and call our office.    The best course would be elevation when at rest, compression daily and exercise.  He does live alone and has a hard time  getting his compression on independently.  His daughter is with him states they need to discuss possible help.  If he is unable to wear the compression he can at least do more frequent elevation throughout the day.  F/U PRN with concerns.    Mosetta Pigeon PA-C [facility] [place] Office: [phone]  MD in clinic [place]

## 2023-05-25 ENCOUNTER — Telehealth: Payer: Self-pay | Admitting: Cardiology

## 2023-05-25 ENCOUNTER — Encounter: Payer: Self-pay | Admitting: *Deleted

## 2023-05-25 NOTE — Telephone Encounter (Signed)
No longer bleeding, Recommend going back to Eliquis 5mg  BID

## 2023-05-25 NOTE — Telephone Encounter (Signed)
*  STAT* If patient is at the pharmacy, call can be transferred to refill team.   1. Which medications need to be refilled? (please list name of each medication and dose if known)   apixaban (ELIQUIS) 2.5 MG TABS tablet    2. Which pharmacy/location (including street and city if local pharmacy) is medication to be sent to?[facility] [phone]  3. Do they need a 30 day or 90 day supply? 90 day

## 2023-05-25 NOTE — Telephone Encounter (Signed)
Prescription refill request for Eliquis received. Indication: AF Last office visit: 03/25/23  Kym Groom MD Scr: 1.12 on 03/31/23  Epic Age: 82 Weight: 66.8kg

## 2023-05-26 MED ORDER — APIXABAN 2.5 MG PO TABS: 2.5000 mg | ORAL_TABLET | ORAL | 0 refills | Status: DC

## 2023-05-26 NOTE — Telephone Encounter (Signed)
Spoke with pt's daughter Erskine Speed about increasing Eliquis dose.  She states she does not feel pt will want to increase dose due to the bleeing issues he had had in the last 6 months.  She states pt has an appt next week with Dr Lalla Brothers to discuss Watchman.  She would prefer to wait till after seeing Dr Lalla Brothers to make any med changes.  Rx for Eliquis 2.5mg  tablet sent to pharmacy as requested.

## 2023-05-31 ENCOUNTER — Other Ambulatory Visit: Payer: Self-pay | Admitting: *Deleted

## 2023-05-31 MED ORDER — APIXABAN 2.5 MG PO TABS: 2.5000 mg | ORAL_TABLET | ORAL | 0 refills | Status: DC

## 2023-05-31 NOTE — Telephone Encounter (Signed)
Prescription refill request for [name] received. Indication: PAF Last office visit: 03/25/23  Jesse Groom MD Scr: 1.12 on 03/31/23  Epic Age: 82 Weight: 66.8kg  Note from 05/26/23: Spoke with pt's daughter Erskine Speed about increasing [name] dose.  She states she does not feel pt will want to increase dose due to the bleeing issues he had had in the last 6 months.  She states pt has an appt next week with Dr Lalla Brothers to discuss Watchman.  She would prefer to wait till after seeing Dr Lalla Brothers to make any med changes.  Rx for [name] 2.5mg  tablet sent to pharmacy as requested.     Refill approved.

## 2023-06-02 ENCOUNTER — Encounter: Payer: Self-pay | Admitting: Cardiology

## 2023-06-02 ENCOUNTER — Ambulatory Visit: Payer: Medicare HMO | Attending: Cardiology | Admitting: Cardiology

## 2023-06-02 VITALS — BP 142/92 | HR 66 | Ht 74.0 in | Wt 150.8 lb

## 2023-06-02 DIAGNOSIS — D649 Anemia, unspecified: Secondary | ICD-10-CM | POA: Diagnosis not present

## 2023-06-02 DIAGNOSIS — I4819 Other persistent atrial fibrillation: Secondary | ICD-10-CM | POA: Diagnosis not present

## 2023-06-02 NOTE — Patient Instructions (Signed)
 Medication Instructions:  Your physician recommends that you continue on your current medications as directed. Please refer to the Current Medication list given to you today.  *If you need a refill on your cardiac medications before your next appointment, please call your pharmacy*  Testing/Procedures: Your physician has requested that you have Left atrial appendage (LAA) closure device implantation is a procedure to put a small device in the LAA of the heart. The LAA is a small sac in the wall of the heart's left upper chamber. Blood clots can form in this area. The device, Watchman closes the LAA to help prevent a blood clot and stroke.   Follow-Up: At [facility], you and your health needs are our priority.  As part of our continuing mission to provide you with exceptional heart care, we have created designated Provider Care Teams.  These Care Teams include your primary Cardiologist (physician) and Advanced Practice Providers (APPs -  Physician Assistants and Nurse Practitioners) who all work together to provide you with the care you need, when you need it.  Your next appointment:   You will be contacted by Nurse Navigator, Karsten Fells to schedule your pre-procedure visit and procedure date. If you have any questions she can be reached at [phone].

## 2023-06-02 NOTE — Progress Notes (Signed)
Electrophysiology Office Note:    Date:  06/02/2023   ID:  Reddington, Grieco 1940/10/17, MRN [id number]  [facility] Cardiologist:  Armanda Magic, MD  [facility] Electrophysiologist:  Lanier Prude, MD   Referring MD: Renford Dills, MD   Chief Complaint: AF  History of Present Illness:      Discussed the use of AI scribe software for clinical note transcription with the patient, who gave verbal consent to proceed.  History of Present Illness   Mr. [name], an 82 year old man with a history of coronary artery disease, chronic diastolic heart failure, paroxysmal atrial fibrillation, hypertension, hyperlipidemia, and thoracic aortic aneurysm, presents for evaluation of atrial fibrillation and potential candidacy for a Watchman device. The patient was previously on flecainide for atrial fibrillation, but this was complicated by ventricular tachycardia. He is currently managed with amiodarone.  The patient also has a history of chronic anemia and bleeding. He was recently hospitalized due to symptomatic anemia with a hemoglobin of 7.4 and received two units of PRBCs. Post-transfusion, his hemoglobin improved to 9.2. He is currently on Eliquis 2.5mg  twice daily.  The patient reports no current symptoms of heart problems and maintains an active lifestyle, including regular workouts. He lives alone and has experienced some bleeding incidents due to fragile skin and edema in the lower extremities. He has a history of varicose veins and has had to be sutured twice due to injuries.               Their past medical, social and family history was reveiwed.   ROS:   Please see the history of present illness.    All other systems reviewed and are negative.  EKGs/Labs/Other Studies Reviewed:    The following studies were reviewed today:         Physical Exam:    VS:  BP (!) 142/92 (BP Location: Left Arm, Patient Position: Sitting, Cuff Size: Normal)   Pulse 66    Ht 6\' 2"  (1.88 m)   Wt 150 lb 12.8 oz (68.4 kg)   SpO2 96%   BMI 19.36 kg/m     Wt Readings from Last 3 Encounters:  06/02/23 150 lb 12.8 oz (68.4 kg)  04/26/23 153 lb 9.6 oz (69.7 kg)  03/31/23 145 lb 11.6 oz (66.1 kg)     Physical Exam   CHEST: Normal breath sounds. EXTREMITIES: Edema in lower extremities, no thigh involvement. Advised daily compression by vascular specialists.           ASSESSMENT AND PLAN:    1. Persistent atrial fibrillation (HCC)   2. Symptomatic anemia       Assessment and Plan    Atrial Fibrillation On Eliquis 2.5mg  daily due to history of bleeding. Discussed the risks/benefits of Watchman device implantation to reduce stroke risk and avoid long-term anticoagulation. Discussed data supporting implant of watchman on Eliquis 2.5mg  PO BID. We discussed the pros/cons of this approach, and they wish to proceed with this strategy. -Plan for Watchman device implantation. -Continue Eliquis 2.5mg  daily until procedure.  Chronic Anemia and Bleeding History of symptomatic anemia requiring PRBC transfusion. Currently resolved. -Monitor for recurrence of bleeding.  Lower Extremity Edema Likely due to venous insufficiency. Difficulty with compression stocking adherence due to back issues. -Encourage consistent use of compression stockings as tolerated.  Nutrition/Weight Loss Significant weight loss over time. Discussed the importance of maintaining weight and muscle mass, particularly in preparation for upcoming procedure. -Increase protein intake to support healing and maintain muscle mass.  I have seen [name] in the office today who is being considered for a Watchman left atrial appendage closure device. I believe they will benefit from this procedure given their history of atrial fibrillation, CHA2DS2-VASc score of 5 and unadjusted ischemic stroke rate of 7.2% per year. Unfortunately, the patient is not felt to be a long term  anticoagulation candidate secondary to symptomatic anemia requiring transfusion. The patient's chart has been reviewed and I feel that they would be a candidate for short term oral anticoagulation after Watchman implant.   It is my belief that after undergoing a LAA closure procedure, [name] will not need long term anticoagulation which eliminates anticoagulation side effects and major bleeding risk.   Procedural risks for the Watchman implant have been reviewed with the patient including a 0.5% risk of stroke, <1% risk of perforation and <1% risk of device embolization. Other risks include bleeding, vascular damage, tamponade, worsening renal function, and death. The patient understands these risk and wishes to proceed.     The published clinical data on the safety and effectiveness of WATCHMAN include but are not limited to the following: - Holmes DR, Everlene Farrier, Sick P et al. for the PROTECT AF Investigators. Percutaneous closure of the left atrial appendage versus warfarin therapy for prevention of stroke in patients with atrial fibrillation: a randomised non-inferiority trial. Lancet 2009; 374: 534-42. Everlene Farrier, Doshi SK, Isa Rankin D et al. on behalf of the PROTECT AF Investigators. Percutaneous Left Atrial Appendage Closure for Stroke Prophylaxis in Patients With Atrial Fibrillation 2.3-Year Follow-up of the PROTECT AF (Watchman Left Atrial Appendage System for Embolic Protection in Patients With Atrial Fibrillation) Trial. Circulation 2013; 127:720-729. - Alli O, Doshi S,  Kar S, Reddy VY, Sievert H et al. Quality of Life Assessment in the Randomized PROTECT AF (Percutaneous Closure of the Left Atrial Appendage Versus Warfarin Therapy for Prevention of Stroke in Patients With Atrial Fibrillation) Trial of Patients at Risk for Stroke With Nonvalvular Atrial Fibrillation. J Am Coll Cardiol 2013; 61:1790-8. Aline August DR, [place]ia [place], Price M, Whisenant B, Sievert H, Doshi S, Huber K, Reddy  V. Prospective randomized evaluation of the Watchman left atrial appendage Device in patients with atrial fibrillation versus long-term warfarin therapy; the PREVAIL trial. Journal of the [employer] of Cardiology, Vol. 4, No. 1, 2014, 1-11. - Kar S, Doshi SK, Sadhu A, Horton R, Osorio J et al. Primary outcome evaluation of a next-generation left atrial appendage closure device: results from the PINNACLE FLX trial. Circulation 2021;143(18)1754-1762.    After today's visit with the patient which was dedicated solely for shared decision making visit regarding LAA closure device, the patient decided to proceed with the LAA appendage closure procedure scheduled to be done in the near future at [facility].  Additionally, the patient will need an updated echocardiogram.  HAS-BLED score 3 Hypertension Yes  Abnormal renal and liver function (Dialysis, transplant, Cr >2.26 mg/dL /Cirrhosis or Bilirubin >2x Normal or AST/ALT/AP >3x Normal) No  Stroke No  Bleeding Yes  Labile INR (Unstable/high INR) No  Elderly (>65) Yes  Drugs or alcohol (>= 8 drinks/week, anti-plt or NSAID) No   CHA2DS2-VASc Score = 5  The patient's score is based upon: CHF History: 1 HTN History: 1 Diabetes History: 0 Stroke History: 0 Vascular Disease History: 1 Age Score: 2 Gender Score: 0       Signed, Sheria Lang T. Lalla Brothers, MD, [facility], [facility] 06/02/2023 5:08 PM    Electrophysiology [facility]

## 2023-06-04 ENCOUNTER — Other Ambulatory Visit: Payer: Self-pay | Admitting: Cardiology

## 2023-06-04 ENCOUNTER — Telehealth: Payer: Self-pay

## 2023-06-04 DIAGNOSIS — I4819 Other persistent atrial fibrillation: Secondary | ICD-10-CM

## 2023-06-04 MED ORDER — APIXABAN 2.5 MG PO TABS: 2.5000 mg | ORAL_TABLET | Freq: Two times a day (BID) | ORAL | 1 refills | Status: DC

## 2023-06-04 NOTE — Telephone Encounter (Signed)
Prescription refill request for Eliquis received. Indication: Afib  Last office visit: 06/02/23 Lalla Brothers)  Scr: 1.12 (03/31/23)  Age: 82 Weight: 68.4kg  Per dosing criteria, appropriate dose is 5mg  BID.  In previous refill notes, pt has requested to stay on 2.5mg  BID d/t chronic anemia and bleeding.  Pt saw Dr Lalla Brothers on 06/02/23 and discussed Watchman device implantation.   Will forward to Dr Lalla Brothers to determine if pt should remain on 2.5mg  BID or increase to 5mg  BID.

## 2023-06-04 NOTE — Telephone Encounter (Signed)
Per Dr. Lovena Neighbours note: Atrial Fibrillation On Eliquis 2.5mg  daily due to history of bleeding. Discussed the risks/benefits of Watchman device implantation to reduce stroke risk and avoid long-term anticoagulation. Discussed data supporting implant of watchman on Eliquis 2.5mg  PO BID. We discussed the pros/cons of this approach, and they wish to proceed with this strategy. -Plan for Watchman device implantation. -Continue Eliquis 2.5mg  daily until procedure.

## 2023-06-04 NOTE — Telephone Encounter (Addendum)
Eliquis 2.5mg  BID per Dr Lalla Brothers.  Called [facility] and clarified appropriate dosage on  prescription with Maralyn Sago, PharmD. Refill sent to pharmacy.  Called pt and made him aware refill has been sent.

## 2023-06-09 ENCOUNTER — Other Ambulatory Visit: Payer: Self-pay

## 2023-06-09 ENCOUNTER — Emergency Department (HOSPITAL_COMMUNITY)
Admission: EM | Admit: 2023-06-09 | Discharge: 2023-06-09 | Disposition: A | Discharge status: Home / Self Care | Payer: Medicare HMO | Attending: Emergency Medicine | Admitting: Emergency Medicine

## 2023-06-09 ENCOUNTER — Ambulatory Visit: Payer: Medicare HMO | Admitting: Infectious Disease

## 2023-06-09 DIAGNOSIS — Z7901 Long term (current) use of anticoagulants: Secondary | ICD-10-CM | POA: Insufficient documentation

## 2023-06-09 DIAGNOSIS — I83891 Varicose veins of right lower extremities with other complications: Secondary | ICD-10-CM | POA: Diagnosis not present

## 2023-06-09 DIAGNOSIS — I83899 Varicose veins of unspecified lower extremities with other complications: Secondary | ICD-10-CM | POA: Diagnosis not present

## 2023-06-09 DIAGNOSIS — R58 Hemorrhage, not elsewhere classified: Secondary | ICD-10-CM | POA: Diagnosis present

## 2023-06-09 DIAGNOSIS — R001 Bradycardia, unspecified: Secondary | ICD-10-CM | POA: Diagnosis not present

## 2023-06-09 DIAGNOSIS — I959 Hypotension, unspecified: Secondary | ICD-10-CM | POA: Diagnosis not present

## 2023-06-09 DIAGNOSIS — R000 Tachycardia, unspecified: Secondary | ICD-10-CM | POA: Diagnosis not present

## 2023-06-09 DIAGNOSIS — R42 Dizziness and giddiness: Secondary | ICD-10-CM | POA: Diagnosis not present

## 2023-06-09 LAB — BASIC METABOLIC PANEL
Anion gap: 6 (ref 5–15)
BUN: 15 mg/dL (ref 8–23)
CO2: 25 mmol/L (ref 22–32)
Calcium: 8.3 mg/dL — ABNORMAL LOW (ref 8.9–10.3)
Chloride: 107 mmol/L (ref 98–111)
Creatinine, Ser: 1.02 mg/dL (ref 0.61–1.24)
GFR, Estimated: >60 mL/min (ref >60)
Glucose, Bld: 109 mg/dL — ABNORMAL HIGH (ref 70–99)
Potassium: 4.2 mmol/L (ref 3.5–5.1)
Sodium: 138 mmol/L (ref 135–145)

## 2023-06-09 LAB — CBC WITH DIFFERENTIAL/PLATELET
Abs Immature Granulocytes: 0.03 10*3/uL (ref 0.00–0.07)
Basophils Absolute: 0 10*3/uL (ref 0.0–0.1)
Basophils Relative: 0 %
Eosinophils Absolute: 0.1 10*3/uL (ref 0.0–0.5)
Eosinophils Relative: 1 %
HCT: 28.1 % — ABNORMAL LOW (ref 39.0–52.0)
Hemoglobin: 8.5 g/dL — ABNORMAL LOW (ref 13.0–17.0)
Immature Granulocytes: 1 %
Lymphocytes Relative: 8 %
Lymphs Abs: 0.5 10*3/uL — ABNORMAL LOW (ref 0.7–4.0)
MCH: 25.1 pg — ABNORMAL LOW (ref 26.0–34.0)
MCHC: 30.2 g/dL (ref 30.0–36.0)
MCV: 82.9 fL (ref 80.0–100.0)
Monocytes Absolute: 0.5 10*3/uL (ref 0.1–1.0)
Monocytes Relative: 8 %
Neutro Abs: 4.7 10*3/uL (ref 1.7–7.7)
Neutrophils Relative %: 82 %
Platelets: 184 10*3/uL (ref 150–400)
RBC: 3.39 MIL/uL — ABNORMAL LOW (ref 4.22–5.81)
RDW: 18.7 % — ABNORMAL HIGH (ref 11.5–15.5)
WBC: 5.7 10*3/uL (ref 4.0–10.5)
nRBC: 0 % (ref 0.0–0.2)

## 2023-06-09 NOTE — Discharge Instructions (Signed)
Follow-up with the vascular surgeon in the office.  If they think it would benefit you they might be able to remove the spot that continues to bleed.  I would keep it covered until least tomorrow.  After that you can put a Band-Aid over top of it to try and keep it from getting rubbed.  If you do have recurrent bleeding then please come back to the emergency department for repeat evaluation.

## 2023-06-09 NOTE — ED Notes (Signed)
Blue and red top sent to lab

## 2023-06-09 NOTE — ED Provider Notes (Signed)
[place] EMERGENCY DEPARTMENT AT [facility] Provider Note   CSN: [id number] Arrival date & time: 06/09/23  4098     History  Chief Complaint  Patient presents with   Varicose Veins    [name] is a 82 y.o. male.  82 yo M with a cc of bleeding from R leg.  Hx of similar.  Noticed it getting out of the shower.  Denies injury.  Tracked blood throughout his house.  Was dizzy, feeling a bit better.         Home Medications Prior to Admission medications   Medication Sig Start Date End Date Taking? Authorizing Provider  acetaminophen (TYLENOL) 325 MG tablet Take 2 tablets (650 mg total) by mouth every 6 (six) hours as needed for moderate pain. 03/17/22   Wallis Bamberg, PA-C  amiodarone (PACERONE) 200 MG tablet TAKE 1 TABLET(200 MG) BY MOUTH DAILY 06/04/23   Quintella Reichert, MD  apixaban (ELIQUIS) 2.5 MG TABS tablet Take 1 tablet (2.5 mg total) by mouth 2 (two) times daily. 06/04/23   Lanier Prude, MD  atorvastatin (LIPITOR) 20 MG tablet TAKE 1 TABLET(20 MG) BY MOUTH DAILY 06/04/23   Quintella Reichert, MD  Boswellia Serrata (BOSWELLIA PO) Take 1,200 mg by mouth daily.    [provider]  carvedilol (COREG) 3.125 MG tablet Take 3.125 mg by mouth daily. 01/21/22   [provider]  furosemide (LASIX) 20 MG tablet Take 1 tablet (20 mg total) by mouth daily. Patient taking differently: Take 20 mg by mouth every other day. 03/26/23 06/24/23  Pokhrel, Rebekah Chesterfield, MD  lidocaine (LIDODERM) 5 % Place 1 patch onto the skin daily.    [provider]  Misc Natural Products (GLUCOSAMINE CHONDROITIN TRIPLE) TABS Take 1 tablet by mouth daily.    [provider]  Multiple Vitamin (MULTIVITAMIN WITH MINERALS) TABS tablet Take 1 tablet by mouth daily.    [provider]  Omega-3 Fatty Acids (FISH OIL) 1200 MG CAPS Take 1,200 mg by mouth daily.    [provider]      Allergies    Patient has no known allergies.    Review of  Systems   Review of Systems  Physical Exam Updated Vital Signs BP (!) 169/72   Pulse 62   Temp 97.6 F (36.4 C) (Oral)   Resp (!) 23   Ht 6\' 2"  (1.88 m)   Wt 68.4 kg   SpO2 98%   BMI 19.36 kg/m  Physical Exam Vitals and nursing note reviewed.  Constitutional:      Appearance: He is well-developed.  HENT:     Head: Normocephalic and atraumatic.  Eyes:     Pupils: Pupils are equal, round, and reactive to light.  Neck:     Vascular: No JVD.  Cardiovascular:     Rate and Rhythm: Normal rate and regular rhythm.     Heart sounds: No murmur heard.    No friction rub. No gallop.  Pulmonary:     Effort: No respiratory distress.     Breath sounds: No wheezing.  Abdominal:     General: There is no distension.     Tenderness: There is no abdominal tenderness. There is no guarding or rebound.  Musculoskeletal:        General: Normal range of motion.     Cervical back: Normal range of motion and neck supple.     Comments: Small vericose vein vs avm to R lateral aspect of the ankle.  No obvious bleeding.   Skin:    Coloration: Skin is not pale.     Findings: No rash.  Neurological:     Mental Status: He is alert and oriented to person, place, and time.  Psychiatric:        Behavior: Behavior normal.     ED Results / Procedures / Treatments   Labs (all labs ordered are listed, but only abnormal results are displayed) Labs Reviewed  CBC WITH DIFFERENTIAL/PLATELET - Abnormal; Notable for the following components:      Result Value   RBC 3.39 (*)    Hemoglobin 8.5 (*)    HCT 28.1 (*)    MCH 25.1 (*)    RDW 18.7 (*)    Lymphs Abs 0.5 (*)    All other components within normal limits  BASIC METABOLIC PANEL - Abnormal; Notable for the following components:   Glucose, Bld 109 (*)    Calcium 8.3 (*)    All other components within normal limits    EKG None  Radiology No results found.  Procedures Procedures    Medications Ordered in ED Medications - No data to  display  ED Course/ Medical Decision Making/ A&P                                 Medical Decision Making Amount and/or Complexity of Data Reviewed Labs: ordered.   82 yo M with a cc of bleeding from his right ankle.  Patient with hx of recent admission for varicose vein bleeding.  Seems like he had reoccurrence.  No bleeding on exam here. Will obtain labs, dress ankle.   Patient without any continued bleeding.  Hemoglobin may be half a gram less than prior check.  Not symptomatic.  No significant electrolyte abnormalities.  Will have him follow back with vascular surgery.  12:07 PM:  I have discussed the diagnosis/risks/treatment options with the patient.  Evaluation and diagnostic testing in the emergency department does not suggest an emergent condition requiring admission or immediate intervention beyond what has been performed at this time.  They will follow up with PCP, vascular. We also discussed returning to the ED immediately if new or worsening sx occur. We discussed the sx which are most concerning (e.g., sudden worsening pain, fever, inability to tolerate by mouth, recurrent bleeding) that necessitate immediate return. Medications administered to the patient during their visit and any new prescriptions provided to the patient are listed below.  Medications given during this visit Medications - No data to display   The patient appears reasonably screen and/or stabilized for discharge and I doubt any other medical condition or other [facility] requiring further screening, evaluation, or treatment in the ED at this time prior to discharge.          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

## 2023-06-09 NOTE — ED Triage Notes (Signed)
Pt was stepping out of shower and stepped on something, cutting a varicose vein to bleed on bottom of right foot, ems estimated 300cc blood loss on floor, pt xarelto, dressing intact, bleeding controlled at this time

## 2023-06-10 ENCOUNTER — Telehealth: Payer: Self-pay

## 2023-06-10 NOTE — Telephone Encounter (Signed)
Called the patient's daughter, Andrey Campanile, to discuss LAAO planning. She understood that per Dr. Lalla Brothers, the patient will need echocardiogram updated - scheduled the echo for 06/21/2023.  While [place] (critical care RN) reported her dad will likely proceed with LAAO due to bleeding varicose veins (he was in the ER for this again yesterday), she is concerned about his PAD and risk of DVT while not taking a/c. Reiterated to her that there are no guarantees a patient will not get a DVT, and if they do, a/c will have to be restarted.   She requested a note sent to Dr. Lalla Brothers for his thoughts.

## 2023-06-10 NOTE — Addendum Note (Signed)
Addended by: Gunnar Fusi A on: 06/10/2023 01:03 PM   Modules accepted: Orders

## 2023-06-15 ENCOUNTER — Telehealth: Payer: Self-pay | Admitting: *Deleted

## 2023-06-15 NOTE — Telephone Encounter (Signed)
Pt daughter called. Stated he saw PA in oct 2024 and was told if pt was to have another bleeding episode to call the office. Pt went to ED for recent bleed. Previous pt of Dr.Dickson.  Was able to place pt on dec 4th 2024@ 9:20 am with Dr.Cain for veins

## 2023-06-21 ENCOUNTER — Ambulatory Visit (HOSPITAL_COMMUNITY): Payer: Medicare HMO | Attending: Cardiology

## 2023-06-21 DIAGNOSIS — I4819 Other persistent atrial fibrillation: Secondary | ICD-10-CM | POA: Diagnosis not present

## 2023-06-21 LAB — ECHOCARDIOGRAM COMPLETE
AR max vel: 2.05 cm2
AV Area VTI: 2.02 cm2
AV Area mean vel: 2.27 cm2
AV Mean grad: 5.5 mm[Hg]
AV Peak grad: 10.2 mm[Hg]
AV Vena cont: 0.5 cm
Ao pk vel: 1.6 m/s
Area-P 1/2: 3.68 cm2
Calc EF: 51.2 %
MV M vel: 5.6 m/s
MV Peak grad: 125.4 mm[Hg]
P 1/2 time: 629 ms
Radius: 0.47 cm
S' Lateral: 2.9 cm
Single Plane A2C EF: 50.3 %
Single Plane A4C EF: 52.1 %

## 2023-06-23 ENCOUNTER — Encounter: Payer: Self-pay | Admitting: Vascular Surgery

## 2023-06-23 ENCOUNTER — Ambulatory Visit: Payer: Medicare HMO | Admitting: Vascular Surgery

## 2023-06-23 VITALS — BP 182/93 | HR 64 | Temp 97.7°F | Resp 20 | Ht 74.0 in | Wt 159.0 lb

## 2023-06-23 DIAGNOSIS — I872 Venous insufficiency (chronic) (peripheral): Secondary | ICD-10-CM

## 2023-06-23 NOTE — Progress Notes (Signed)
Patient ID: [name], male   DOB: [birth year], 82 y.o.   MRN: [id number]  Reason for Consult: Follow-up   Referred by Jesse Dills, MD  Subjective:     HPI:  [name] is a 82 y.o. male history of a venous wound on the left lower extremity for which she underwent excision and treatment of an underlying perforator vein several years ago.  He is anticoagulated for paroxysmal atrial fibrillation is currently being worked up for [employer] procedure.  In the past 6 months has had 6 episodes of bleeding requiring EMS and several trips to the emergency department.  His daughter is able to provide pictures today of significant bloody scenes at his house.  Patient lives alone he does wear compression stockings is much as tolerable but also has scoliosis and history of a left knee replacement which are quite debilitating and limiting to him placing compression stocks.  He has had venous insufficiency testing of the right lower extremity and now presents for follow-up to discuss intervention.  Past Medical History:  Diagnosis Date   Ascending aorta dilatation (HCC)    44mm by chest CT angio 04/2018   CAD (coronary artery disease)    a. 05/2006 Abnl stress test w/ 2-21mm ST dep; b. 05/2006 Cath/PCI: LM nl, LAD 30-40ost, 20-30p, 90d (small), D1 nl, D2 nl, LCX nl, OM1/2 nl, RCA Ca2+, 91m/d (3.5x16 Liberty BMS & 3.5x12 Liberty BMS), EF 50%.   Chronic diastolic CHF (congestive heart failure) (HCC)    a. 10/2017 Echo: EF 50-55%, mild AI, sev dil LA, mod dil RA.   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 CHA2DS2VASc 5-->Eliquis; b. Prev WCT on flecainide;  c. 12/2017 s/p DCCV.   Pneumothorax    Prolonged Q-T interval on ECG 06/22/2022   Ventricular tachycardia (HCC)    a. possibly proarrhythmia from flecainide.   Family History  Problem Relation Age of Onset   Coronary artery  disease Other    Past Surgical History:  Procedure Laterality Date   CARDIOVERSION N/A 12/23/2017   Procedure: CARDIOVERSION;  Surgeon: Quintella Reichert, MD;  Location: [facility] ENDOSCOPY;  Service: Cardiovascular;  Laterality: N/A;   CARDIOVERSION N/A 03/08/2018   Procedure: CARDIOVERSION;  Surgeon: Quintella Reichert, MD;  Location: [facility] ENDOSCOPY;  Service: Cardiovascular;  Laterality: N/A;   INGUINAL HERNIA REPAIR     x2   left total hip surgery     TEE WITHOUT CARDIOVERSION N/A 03/08/2018   Procedure: TRANSESOPHAGEAL ECHOCARDIOGRAM (TEE);  Surgeon: Quintella Reichert, MD;  Location: [facility] ENDOSCOPY;  Service: Cardiovascular;  Laterality: N/A;   TOTAL HIP ARTHROPLASTY Right 08/19/2020   Procedure: TOTAL HIP ARTHROPLASTY ANTERIOR APPROACH;  Surgeon: Ollen Gross, MD;  Location: [facility] ORS;  Service: Orthopedics;  Laterality: Right;    TOTAL KNEE ARTHROPLASTY Left 02/28/2018   Procedure: LEFT TOTAL KNEE ARTHROPLASTY;  Surgeon: Ollen Gross, MD;  Location: [facility] ORS;  Service: Orthopedics;  Laterality: Left;   VEIN LIGATION AND STRIPPING Left 04/08/2020   Procedure: LEFT LOWER EXTREMITY VEIN LIGATION AND EXCISION OF ULCER;  Surgeon: Chuck Hint, MD;  Location: [facility] OR;  Service: Vascular;  Laterality: Left;    Short Social History:  Social History   Tobacco Use   Smoking status: Never   Smokeless tobacco: Never  Substance Use Topics   Alcohol use: Not Currently    Comment: occsaoinal  No Known Allergies  Current Outpatient Medications  Medication Sig Dispense Refill   acetaminophen (TYLENOL) 325 MG tablet Take 2 tablets (650 mg total) by mouth every 6 (six) hours as needed for moderate pain. 30 tablet 0   amiodarone (PACERONE) 200 MG tablet TAKE 1 TABLET(200 MG) BY MOUTH DAILY 90 tablet 3   apixaban (ELIQUIS) 2.5 MG TABS tablet Take 1 tablet (2.5 mg total) by mouth 2 (two) times daily. 180 tablet 1   atorvastatin (LIPITOR) 20 MG tablet TAKE 1 TABLET(20 MG) BY MOUTH DAILY 90 tablet 3    Boswellia Serrata (BOSWELLIA PO) Take 1,200 mg by mouth daily.     carvedilol (COREG) 3.125 MG tablet Take 3.125 mg by mouth daily.     furosemide (LASIX) 20 MG tablet Take 1 tablet (20 mg total) by mouth daily. (Patient taking differently: Take 20 mg by mouth every other day.)     lidocaine (LIDODERM) 5 % Place 1 patch onto the skin daily.     Misc Natural Products (GLUCOSAMINE CHONDROITIN TRIPLE) TABS Take 1 tablet by mouth daily.     Multiple Vitamin (MULTIVITAMIN WITH MINERALS) TABS tablet Take 1 tablet by mouth daily.     Omega-3 Fatty Acids (FISH OIL) 1200 MG CAPS Take 1,200 mg by mouth daily.     No current facility-administered medications for this visit.    Review of Systems  Constitutional:  Constitutional negative. HENT: HENT negative.  Eyes: Eyes negative.  Respiratory: Respiratory negative.  Cardiovascular: Positive for leg swelling.  GI: Gastrointestinal negative.  Neurological: Neurological negative. Hematologic: Positive for bruises/bleeds easily.        Objective:  Objective   Vitals:   06/23/23 0921  BP: (!) 182/93  Pulse: 64  Resp: 20  Temp: 97.7 F (36.5 C)  SpO2: 94%  Weight: 159 lb (72.1 kg)  Height: 6\' 2"  (1.88 m)   Body mass index is 20.41 kg/m.  Physical Exam HENT:     Head: Normocephalic.     Nose: Nose normal.  Eyes:     Pupils: Pupils are equal, round, and reactive to light.  Cardiovascular:     Pulses: Normal pulses.  Pulmonary:     Effort: Pulmonary effort is normal.  Abdominal:     General: Abdomen is flat.  Musculoskeletal:     Right lower leg: Edema present.     Left lower leg: No edema.  Skin:    Capillary Refill: Capillary refill takes less than 2 seconds.  Neurological:     General: No focal deficit present.     Mental Status: He is alert.  Psychiatric:        Mood and Affect: Mood normal.        Thought Content: Thought content normal.        Data: Venous Reflux Times   +--------------+---------+------+-----------+------------+--------+  RIGHT        Reflux NoRefluxReflux TimeDiameter cmsComments                          Yes                                   +--------------+---------+------+-----------+------------+--------+  CFV                    yes   >1 second                       +--------------+---------+------+-----------+------------+--------+  FV mid                  yes   >1 second                       +--------------+---------+------+-----------+------------+--------+  Popliteal              yes   >1 second                       +--------------+---------+------+-----------+------------+--------+  GSV at [facility]              yes    >500 ms      0.61              +--------------+---------+------+-----------+------------+--------+  GSV prox thigh          yes    >500 ms      0.59              +--------------+---------+------+-----------+------------+--------+  GSV mid thigh           yes    >500 ms      0.32              +--------------+---------+------+-----------+------------+--------+  GSV dist thigh          yes    >500 ms      0.34              +--------------+---------+------+-----------+------------+--------+  GSV at knee             yes    >500 ms      0.40              +--------------+---------+------+-----------+------------+--------+  GSV prox calf           yes    >500 ms      0.23              +--------------+---------+------+-----------+------------+--------+  GSV mid calf            yes    >500 ms      0.31              +--------------+---------+------+-----------+------------+--------+  SSV Pop Fossa no                            0.26              +--------------+---------+------+-----------+------------+--------+         Summary:  Right:  - No evidence of deep vein thrombosis from the common femoral through the  popliteal veins.  -  No evidence of superficial venous thrombosis.    - The deep venous system demonstrates significant reflux.  - The great saphenous vein is not competent.  - The small saphenous vein is competent.         Assessment/Plan:     82 year old male with history as above with 6 recent visits to the ER for bleeding from right lower extremity large telangiectasias or ankle varicosities.  Plan will be for right greater saphenous vein ablation this was evaluated at the bedside today and is large and within the fascia from above the knee all the way to the saphenofemoral junction.  He will also need sclerotherapy likely of both feet moving forward.  All questions answered today.     Maeola Harman MD [facility]

## 2023-06-24 ENCOUNTER — Telehealth: Payer: Self-pay

## 2023-06-24 NOTE — Telephone Encounter (Signed)
Patient called and left a VM requesting a call back in regards to his upcoming appt. He has asked if someone could explain this visit for him and exactly what it is for. He stated "he has lost track".   Could someone possibly assist him on this?

## 2023-06-24 NOTE — Telephone Encounter (Signed)
Attempted to contact patient - no voicemail. Will try again later.

## 2023-07-28 ENCOUNTER — Other Ambulatory Visit: Payer: Self-pay | Admitting: *Deleted

## 2023-07-28 ENCOUNTER — Telehealth: Payer: Self-pay

## 2023-07-28 ENCOUNTER — Other Ambulatory Visit: Payer: Self-pay | Admitting: Surgery

## 2023-07-28 DIAGNOSIS — I83892 Varicose veins of left lower extremities with other complications: Secondary | ICD-10-CM

## 2023-07-28 DIAGNOSIS — I83891 Varicose veins of right lower extremities with other complications: Secondary | ICD-10-CM

## 2023-07-28 DIAGNOSIS — I7121 Aneurysm of the ascending aorta, without rupture: Secondary | ICD-10-CM

## 2023-07-28 NOTE — Telephone Encounter (Signed)
   Pre-operative Risk Assessment    Patient Name: [name]  DOB: 1941-04-24 MRN: [id number]   Date of last office visit: 06/02/23 Dr. Ole Holts Date of next office visit: None   Request for Surgical Clearance    Procedure:  Endovenous laser ablation RT  Date of Surgery:  Clearance 09/09/23                                Surgeon:  Dr. Penne [place] [facility] Group or Practice Name:  [facility] Phone number:  [phone] Fax number:  [phone]   Type of Clearance Requested:   - Medical  - Pharmacy:  Hold Apixaban  (Eliquis ) Stop 2 days before procedure, day of procedure and 2 days following procedure.    Type of Anesthesia:  Local and tumescent   Additional requests/questions:    SignedJolan Bellarae Lizer   07/28/2023, 5:22 PM

## 2023-07-29 NOTE — Telephone Encounter (Signed)
 Please advise holding Eliquis prior to endovenous laser ablation scheduled for 09/09/2023.  Thank you!  DW

## 2023-07-29 NOTE — Telephone Encounter (Signed)
 Dr. Cindie,   You saw this patient on 06/02/2023. I see there is a plan for watchman but I do not see anything further pertaining to scheduling. Per office protocol, will you please comment on medical clearance for endovenous laser ablation?  Please route your response to P [facility] Preop. I will communicate with requesting office once you have given recommendations.   Thank you!  Barnie Hila, NP

## 2023-07-30 NOTE — Telephone Encounter (Signed)
   Name: [name]  DOB: 02/09/1941  MRN: [id number]   Primary Cardiologist: Wilbert Bihari, MD  Chart reviewed as part of pre-operative protocol coverage. [name] was last seen on 06/02/2023 by Dr. Cindie.  He was doing well at that time and denied cardiac complaints. He is active participating in regular workout.   Therefore, based on ACC/AHA guidelines, the patient would be an acceptable risk for the planned procedure without further cardiovascular testing.   Per Dr. Cindie, I think it would be reasonable to hold his OAC (Eliquis ) for 2 days prior to the laser procedure and restart when felt safe from a surgical perspective.   I will route this recommendation to the requesting party via Epic fax function and remove from pre-op pool. Please call with questions.  Barnie Hila, NP 07/30/2023, 11:20 AM

## 2023-08-04 ENCOUNTER — Ambulatory Visit: Payer: Medicare HMO | Admitting: Infectious Disease

## 2023-08-05 DIAGNOSIS — S81801A Unspecified open wound, right lower leg, initial encounter: Secondary | ICD-10-CM | POA: Diagnosis not present

## 2023-08-05 DIAGNOSIS — M1711 Unilateral primary osteoarthritis, right knee: Secondary | ICD-10-CM | POA: Diagnosis not present

## 2023-08-05 DIAGNOSIS — Z96652 Presence of left artificial knee joint: Secondary | ICD-10-CM | POA: Diagnosis not present

## 2023-08-06 DIAGNOSIS — I83019 Varicose veins of right lower extremity with ulcer of unspecified site: Secondary | ICD-10-CM | POA: Diagnosis not present

## 2023-08-06 DIAGNOSIS — I480 Paroxysmal atrial fibrillation: Secondary | ICD-10-CM | POA: Diagnosis not present

## 2023-08-06 DIAGNOSIS — I5032 Chronic diastolic (congestive) heart failure: Secondary | ICD-10-CM | POA: Diagnosis not present

## 2023-08-10 ENCOUNTER — Telehealth: Payer: Self-pay | Admitting: *Deleted

## 2023-08-10 NOTE — Telephone Encounter (Signed)
Returning call from [name]'s daughter [name]).  [name] states that [name] has developed a "draining wound" on his right calf.  [name] knee injection by Dr. Despina Hick was cancelled and Dr. Despina Hick ordered Keflex 250 mg TID for 7 days (started Friday, Jan 17,2025) and made referral to wound care center.  [name] states that Wound Care Center cannot see [name] until 09-22-2023.  [name] will send me photo of her father's right leg.  Will update Dr. Randie Heinz tomorrow (will be in office).  [name] will call me back with update on her father's wound when she sees him tomorrow.

## 2023-08-11 DIAGNOSIS — I872 Venous insufficiency (chronic) (peripheral): Secondary | ICD-10-CM | POA: Diagnosis not present

## 2023-08-11 DIAGNOSIS — L97211 Non-pressure chronic ulcer of right calf limited to breakdown of skin: Secondary | ICD-10-CM | POA: Diagnosis not present

## 2023-08-13 DIAGNOSIS — I77810 Thoracic aortic ectasia: Secondary | ICD-10-CM | POA: Diagnosis not present

## 2023-08-13 DIAGNOSIS — I2510 Atherosclerotic heart disease of native coronary artery without angina pectoris: Secondary | ICD-10-CM | POA: Diagnosis not present

## 2023-08-13 DIAGNOSIS — I480 Paroxysmal atrial fibrillation: Secondary | ICD-10-CM | POA: Diagnosis not present

## 2023-08-13 DIAGNOSIS — I472 Ventricular tachycardia, unspecified: Secondary | ICD-10-CM | POA: Diagnosis not present

## 2023-08-13 DIAGNOSIS — M797 Fibromyalgia: Secondary | ICD-10-CM | POA: Diagnosis not present

## 2023-08-13 DIAGNOSIS — I872 Venous insufficiency (chronic) (peripheral): Secondary | ICD-10-CM | POA: Diagnosis not present

## 2023-08-13 DIAGNOSIS — I110 Hypertensive heart disease with heart failure: Secondary | ICD-10-CM | POA: Diagnosis not present

## 2023-08-13 DIAGNOSIS — L97211 Non-pressure chronic ulcer of right calf limited to breakdown of skin: Secondary | ICD-10-CM | POA: Diagnosis not present

## 2023-08-13 DIAGNOSIS — I5032 Chronic diastolic (congestive) heart failure: Secondary | ICD-10-CM | POA: Diagnosis not present

## 2023-08-18 DIAGNOSIS — I472 Ventricular tachycardia, unspecified: Secondary | ICD-10-CM | POA: Diagnosis not present

## 2023-08-18 DIAGNOSIS — I2510 Atherosclerotic heart disease of native coronary artery without angina pectoris: Secondary | ICD-10-CM | POA: Diagnosis not present

## 2023-08-18 DIAGNOSIS — I77810 Thoracic aortic ectasia: Secondary | ICD-10-CM | POA: Diagnosis not present

## 2023-08-18 DIAGNOSIS — I480 Paroxysmal atrial fibrillation: Secondary | ICD-10-CM | POA: Diagnosis not present

## 2023-08-18 DIAGNOSIS — I5032 Chronic diastolic (congestive) heart failure: Secondary | ICD-10-CM | POA: Diagnosis not present

## 2023-08-18 DIAGNOSIS — I872 Venous insufficiency (chronic) (peripheral): Secondary | ICD-10-CM | POA: Diagnosis not present

## 2023-08-18 DIAGNOSIS — M797 Fibromyalgia: Secondary | ICD-10-CM | POA: Diagnosis not present

## 2023-08-18 DIAGNOSIS — I110 Hypertensive heart disease with heart failure: Secondary | ICD-10-CM | POA: Diagnosis not present

## 2023-08-18 DIAGNOSIS — L97211 Non-pressure chronic ulcer of right calf limited to breakdown of skin: Secondary | ICD-10-CM | POA: Diagnosis not present

## 2023-08-20 DIAGNOSIS — R54 Age-related physical debility: Secondary | ICD-10-CM | POA: Diagnosis not present

## 2023-08-20 DIAGNOSIS — I83019 Varicose veins of right lower extremity with ulcer of unspecified site: Secondary | ICD-10-CM | POA: Diagnosis not present

## 2023-08-20 DIAGNOSIS — L97211 Non-pressure chronic ulcer of right calf limited to breakdown of skin: Secondary | ICD-10-CM | POA: Diagnosis not present

## 2023-08-20 DIAGNOSIS — I10 Essential (primary) hypertension: Secondary | ICD-10-CM | POA: Diagnosis not present

## 2023-08-20 DIAGNOSIS — I872 Venous insufficiency (chronic) (peripheral): Secondary | ICD-10-CM | POA: Diagnosis not present

## 2023-08-24 DIAGNOSIS — L97211 Non-pressure chronic ulcer of right calf limited to breakdown of skin: Secondary | ICD-10-CM | POA: Diagnosis not present

## 2023-08-24 DIAGNOSIS — I77810 Thoracic aortic ectasia: Secondary | ICD-10-CM | POA: Diagnosis not present

## 2023-08-24 DIAGNOSIS — I872 Venous insufficiency (chronic) (peripheral): Secondary | ICD-10-CM | POA: Diagnosis not present

## 2023-08-24 DIAGNOSIS — I472 Ventricular tachycardia, unspecified: Secondary | ICD-10-CM | POA: Diagnosis not present

## 2023-08-24 DIAGNOSIS — I2510 Atherosclerotic heart disease of native coronary artery without angina pectoris: Secondary | ICD-10-CM | POA: Diagnosis not present

## 2023-08-24 DIAGNOSIS — M797 Fibromyalgia: Secondary | ICD-10-CM | POA: Diagnosis not present

## 2023-08-24 DIAGNOSIS — I5032 Chronic diastolic (congestive) heart failure: Secondary | ICD-10-CM | POA: Diagnosis not present

## 2023-08-24 DIAGNOSIS — I480 Paroxysmal atrial fibrillation: Secondary | ICD-10-CM | POA: Diagnosis not present

## 2023-08-24 DIAGNOSIS — I110 Hypertensive heart disease with heart failure: Secondary | ICD-10-CM | POA: Diagnosis not present

## 2023-08-30 DIAGNOSIS — I77810 Thoracic aortic ectasia: Secondary | ICD-10-CM | POA: Diagnosis not present

## 2023-08-30 DIAGNOSIS — I110 Hypertensive heart disease with heart failure: Secondary | ICD-10-CM | POA: Diagnosis not present

## 2023-08-30 DIAGNOSIS — I5032 Chronic diastolic (congestive) heart failure: Secondary | ICD-10-CM | POA: Diagnosis not present

## 2023-08-30 DIAGNOSIS — I480 Paroxysmal atrial fibrillation: Secondary | ICD-10-CM | POA: Diagnosis not present

## 2023-08-30 DIAGNOSIS — I872 Venous insufficiency (chronic) (peripheral): Secondary | ICD-10-CM | POA: Diagnosis not present

## 2023-08-30 DIAGNOSIS — I472 Ventricular tachycardia, unspecified: Secondary | ICD-10-CM | POA: Diagnosis not present

## 2023-08-30 DIAGNOSIS — I2510 Atherosclerotic heart disease of native coronary artery without angina pectoris: Secondary | ICD-10-CM | POA: Diagnosis not present

## 2023-08-30 DIAGNOSIS — M797 Fibromyalgia: Secondary | ICD-10-CM | POA: Diagnosis not present

## 2023-08-30 DIAGNOSIS — L97211 Non-pressure chronic ulcer of right calf limited to breakdown of skin: Secondary | ICD-10-CM | POA: Diagnosis not present

## 2023-08-30 IMAGING — CT CT ANGIO CHEST
2 of 6 series · 13 of 36 positions shown · IV contrast (iopamidol)
Comparison: CT examination dated May 24, 2020

CLINICAL DATA: Thoracic aortic aneurysm follow-up.

EXAM:
CT ANGIOGRAPHY CHEST WITH CONTRAST
TECHNIQUE: Multidetector CT imaging of the chest was performed using the
standard protocol during bolus administration of intravenous
contrast. Multiplanar CT image reconstructions and MIPs were
obtained to evaluate the vascular anatomy.
CONTRAST:  75mL [id number] IOPAMIDOL ([id number]) INJECTION 61%

[Series 6: cta thorax 2.00 bv36 s3 axial arterial · axial · arterial · 0.75mm/px · z∈[+1515,+1851]mm · 12 of 200 slices shown]
[im 16/200  lung]
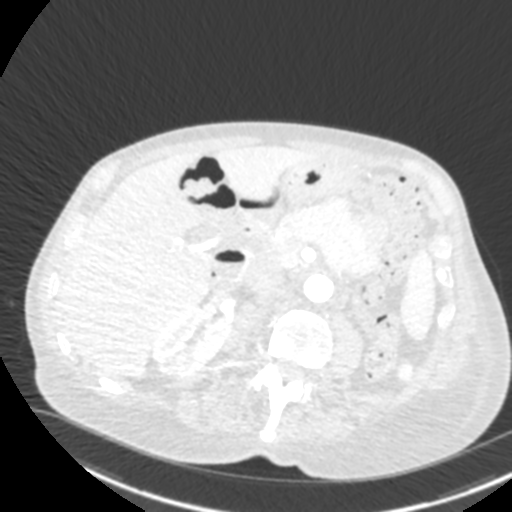
[im 31/200  mediastinal]
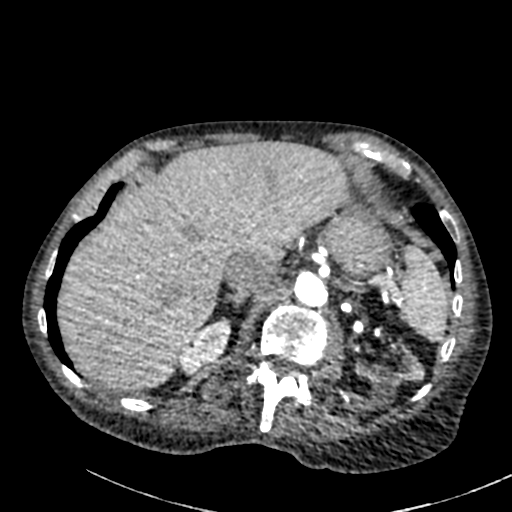
[im 46/200  lung]
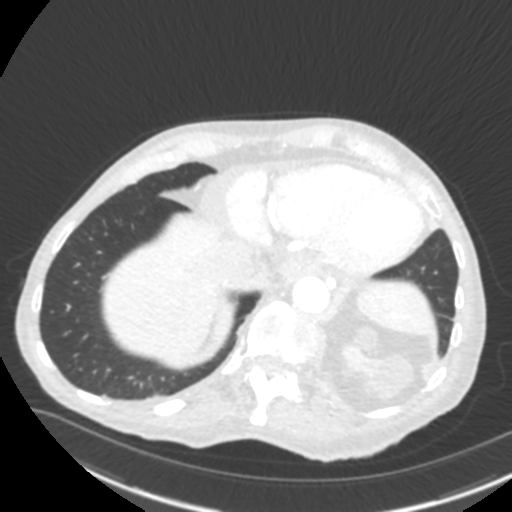
[im 62/200  mediastinal]
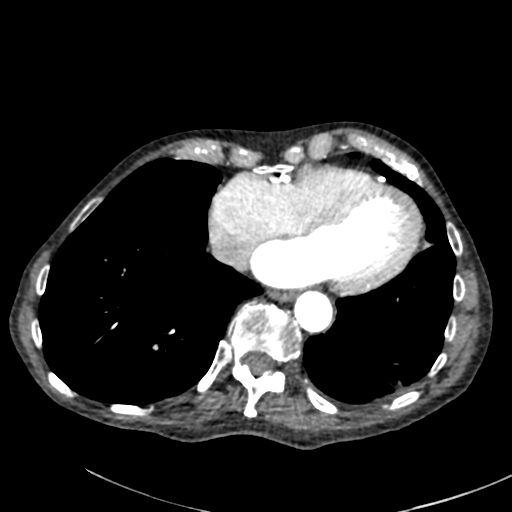
[im 77/200  lung]
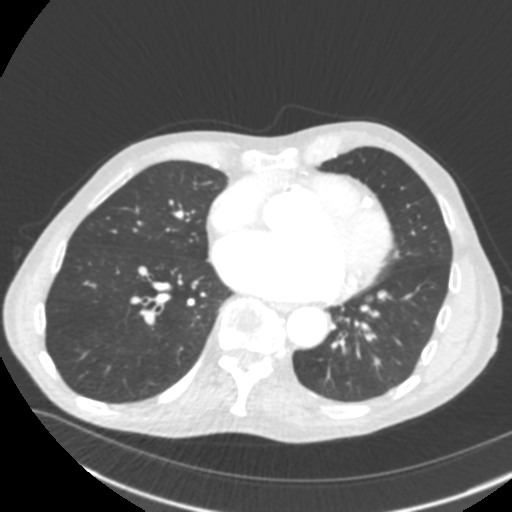
[im 92/200  mediastinal]
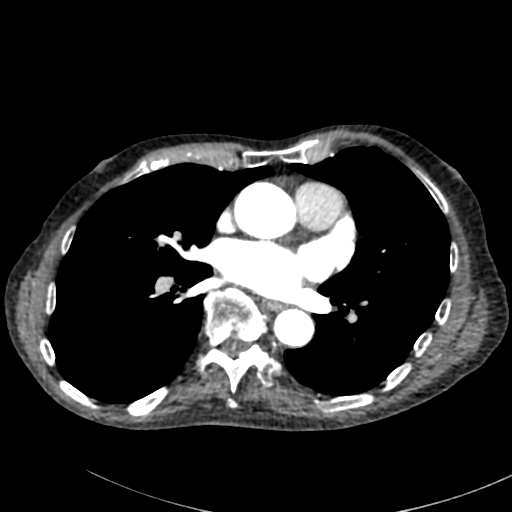
[im 108/200  lung]
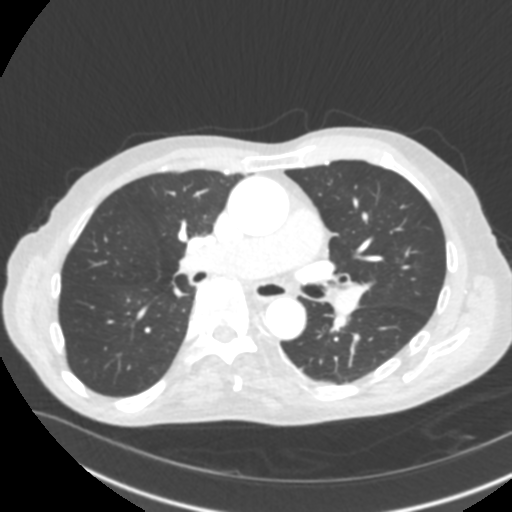
[im 123/200  mediastinal]
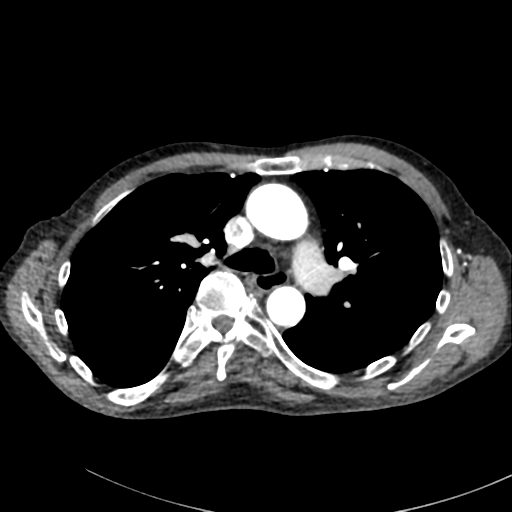
[im 138/200  lung]
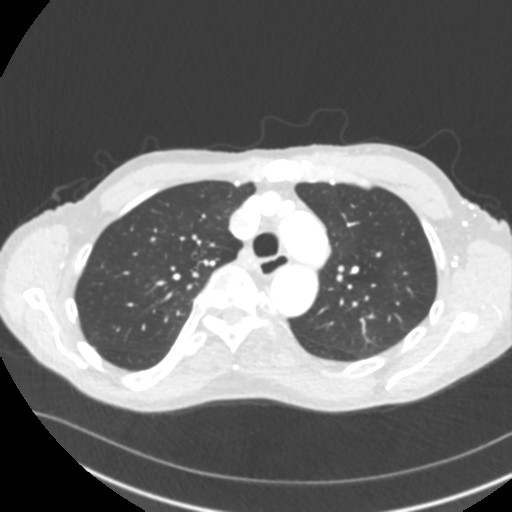
[im 154/200  mediastinal]
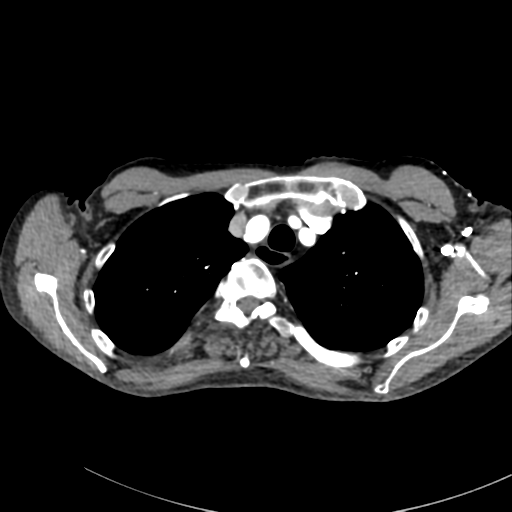
[im 169/200  lung]
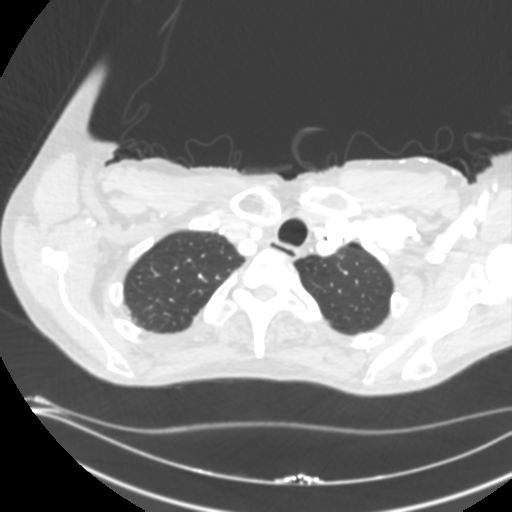
[im 184/200  mediastinal]
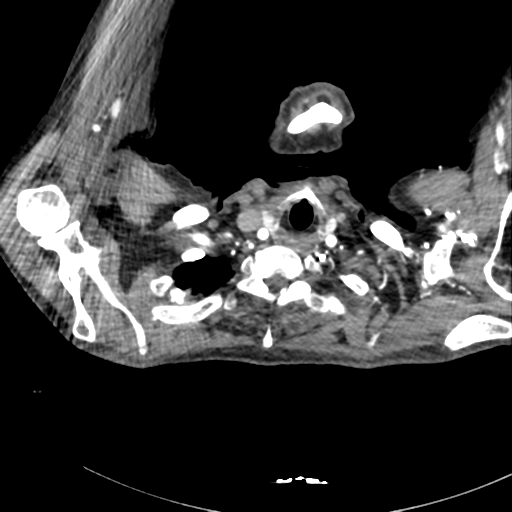

[Series 11: cta thorax 2.00 bv36 s3 cor st · coronal · 0.75mm/px · 1 of 190 slices shown]
[im 95/190  mediastinal]
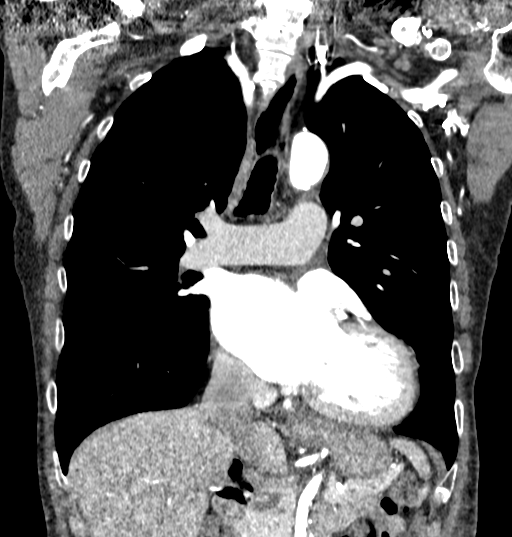

[13 of 36 positions shown; findings below may reference images not displayed]

FINDINGS: Cardiovascular: Preferential opacification of the thoracic aorta.
Stable appearance of the fusiform aneurysmal dilation of the
ascending thoracic aorta measuring up to 4.5 cm. No evidence of
acute dissection, mediastinal hemorrhage or hematoma. Stable
cardiomegaly and coronary artery atherosclerotic disease. No
pericardial effusion. Descending thoracic aorta is normal in
caliber.

Mediastinum/Nodes: No enlarged mediastinal, hilar, or axillary lymph
nodes. Thyroid gland, trachea, and esophagus demonstrate no
significant findings.

Lungs/Pleura: Left basilar subsegmental linear atelectasis. Lungs
otherwise clear. No pleural effusion or pneumothorax.

Upper Abdomen: No acute abnormality. Stable 3 cm right adrenal
nodule. Left renal cortical atrophy and multiple cysts are
unchanged.

Musculoskeletal: Moderate S shaped scoliosis of the thoracolumbar
spine. Degenerative disease. No suspicious osseous lesion.

Review of the MIP images confirms the above findings.
IMPRESSION: 1. Stable fusiform aneurysmal dilatation of the ascending thoracic
aorta measuring up to 4.5 cm. No evidence of aortic dissection or
acute vascular abnormality.
2. Stable cardiomegaly and coronary artery atherosclerotic disease.
3. Moderate S shaped scoliosis of the thoracic spine. No acute
osseous abnormality.

## 2023-08-31 ENCOUNTER — Telehealth: Payer: Self-pay | Admitting: *Deleted

## 2023-08-31 DIAGNOSIS — I110 Hypertensive heart disease with heart failure: Secondary | ICD-10-CM | POA: Diagnosis not present

## 2023-08-31 DIAGNOSIS — I4891 Unspecified atrial fibrillation: Secondary | ICD-10-CM | POA: Diagnosis not present

## 2023-08-31 DIAGNOSIS — I872 Venous insufficiency (chronic) (peripheral): Secondary | ICD-10-CM | POA: Diagnosis not present

## 2023-08-31 DIAGNOSIS — I509 Heart failure, unspecified: Secondary | ICD-10-CM | POA: Diagnosis not present

## 2023-08-31 DIAGNOSIS — I7090 Unspecified atherosclerosis: Secondary | ICD-10-CM | POA: Diagnosis not present

## 2023-08-31 DIAGNOSIS — L97812 Non-pressure chronic ulcer of other part of right lower leg with fat layer exposed: Secondary | ICD-10-CM | POA: Diagnosis not present

## 2023-08-31 DIAGNOSIS — I83019 Varicose veins of right lower extremity with ulcer of unspecified site: Secondary | ICD-10-CM | POA: Diagnosis not present

## 2023-08-31 DIAGNOSIS — Z7901 Long term (current) use of anticoagulants: Secondary | ICD-10-CM | POA: Diagnosis not present

## 2023-08-31 DIAGNOSIS — L97919 Non-pressure chronic ulcer of unspecified part of right lower leg with unspecified severity: Secondary | ICD-10-CM | POA: Diagnosis not present

## 2023-08-31 NOTE — Telephone Encounter (Signed)
Ms. Prentiss Bells (emergency contact of [name]) is calling on behalf of her father to cancel the laser ablation procedure scheduled for 09-09-2023 with Dr. Randie Heinz.  Ms. Prentiss Bells states that she was able to get [name] into [facility] today for initial consult and treatment. She states that he has 2 ulcers on right leg that were cultured today.  She states that the cellulitis of his right leg is"minimally improved" and that her father has not been compliant with keeping his compression dressings on.  Ms. Prentiss Bells will keep me updated on her father's treatment progress at the wound care center ([facility]). We agreed to wait and initiate another prior authorization with his insurance company and plan to reschedule the procedure when his condition allows.

## 2023-08-31 NOTE — Progress Notes (Unsigned)
Subjective:  Chief complaint: follow-up for M. Chelonae infection   Patient ID: [name], male    DOB: 11-Jun-1941, 83 y.o.   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 Swaziland and has had history of basal cell carcinoma squamous cell carcinoma.  On August 19, 2021 he underwent Mohs procedure to right medial zygoma squamous cell carcinoma.  This was successfully cured but unfortunately he developed a lesion there that has been subsequently biopsied and found to have granulomas and mycobacteria seen on stain.  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  Susceptibilities are shown below though these were prior to macrolide S being known and organism was macrolide sensitive       Interim history:  "We ended up starting clofazimine 2 tablets daily with follow-up with cardiology and review of EKG that showed no worsening of her QT prolongation and then we added azithromycin 500 mg daily again with follow-up with cardiology and stability on EKG if QT.  Says he is not suffering from any adverse effects from the antibiotics and he feels the lesion on his face is shrinking somewhat.   He did have concerns re a pedunculated area that was present on one occasion when  I saw him and did  not appear related to his infection.   Been continued on antimycobacterial drugs though he did have some problems with QT prolongation that prompted with some azithromycin to [facility] with clofazimine that he has now been on.   He has completed well over a year of therapy at this point he really should have his mycobacterial infection cured at this point."  Now been off antimycobacterial antibiotics since August    There seems to be no evidence of recurrence where he had the prior infection he does have some skin lesions on his left temple that are apparently new per his daughter and encouraged him to see Dr. Swaziland to follow-up with regards to that.  He does have peripheral vascular disease and has developed some wounds in his right lower extremity that are being attended to by wound care.  The daughter mentioned that these wounds emerged potentially by coincidence when he came off his antimicrobial therapy.  I would be skeptical that these represent mycobacterial infections since he was not on immunosuppressive therapy it would seem that disseminated infection would be highly unlikely he also has known peripheral vascular disease and has a reason to have wounds that might develop on his lower extremities.   Past Medical History:  Diagnosis Date   Ascending aorta dilatation (HCC)    44mm by chest CT angio 04/2018   CAD (coronary artery disease)    a. 05/2006 Abnl stress test w/ 2-40mm ST dep; b. 05/2006 Cath/PCI: LM nl, LAD 30-40ost, 20-30p, 90d (small), D1 nl, D2 nl, LCX nl, OM1/2 nl, RCA Ca2+, 51m/d (3.5x16 Liberty BMS & 3.5x12 Liberty BMS), EF 50%.   Chronic diastolic CHF (congestive heart failure) (HCC)    a. 10/2017 Echo: EF 50-55%, mild AI, sev dil LA, mod dil RA.   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 CHA2DS2VASc 5-->Eliquis; b. Prev WCT on flecainide;  c. 12/2017 s/p DCCV.   Pneumothorax    Prolonged Q-T interval on ECG 06/22/2022   Ventricular tachycardia (HCC)    a. possibly proarrhythmia from flecainide.    Past Surgical History:  Procedure Laterality Date   CARDIOVERSION N/A 12/23/2017   Procedure: CARDIOVERSION;  Surgeon: Quintella Reichert, MD;  Location: [facility] ENDOSCOPY;  Service: Cardiovascular;  Laterality: N/A;   CARDIOVERSION N/A 03/08/2018   Procedure: CARDIOVERSION;  Surgeon:  Quintella Reichert, MD;  Location: [facility] ENDOSCOPY;  Service: Cardiovascular;  Laterality: N/A;   INGUINAL HERNIA REPAIR     x2   left total hip surgery     TEE WITHOUT CARDIOVERSION N/A 03/08/2018   Procedure: TRANSESOPHAGEAL ECHOCARDIOGRAM (TEE);  Surgeon: Quintella Reichert, MD;  Location: [facility] ENDOSCOPY;  Service: Cardiovascular;  Laterality: N/A;   TOTAL HIP ARTHROPLASTY Right 08/19/2020   Procedure: TOTAL HIP ARTHROPLASTY ANTERIOR APPROACH;  Surgeon: Ollen Gross, MD;  Location: [facility] ORS;  Service: Orthopedics;  Laterality: Right;    TOTAL KNEE ARTHROPLASTY Left 02/28/2018   Procedure: LEFT TOTAL KNEE ARTHROPLASTY;  Surgeon: Ollen Gross, MD;  Location: [facility] ORS;  Service: Orthopedics;  Laterality: Left;   VEIN LIGATION AND STRIPPING Left 04/08/2020   Procedure: LEFT LOWER EXTREMITY VEIN LIGATION AND EXCISION OF ULCER;  Surgeon: Chuck Hint, MD;  Location: [facility] OR;  Service: Vascular;  Laterality: Left;    Family History  Problem Relation Age of Onset   Coronary artery disease Other       Social History   Socioeconomic History   Marital status: Single    Spouse name: Not on file   Number of children: Not on file   Years of education: Not on file   Highest education level: Not on file  Occupational History   Occupation: retired  Tobacco Use   Smoking status: Never   Smokeless tobacco: Never  Vaping Use   Vaping status: Never Used  Substance and Sexual Activity   Alcohol use: Not Currently    Comment: occsaoinal   Drug use: Not Currently    Types: Other-see comments   Sexual activity: Not Currently  Other Topics Concern   Not on file  Social History Narrative   ** Merged History Encounter **       Lives in [place] by himself but 2 dtrs nearby and help out when necessary.   Social Drivers of Corporate investment banker Strain: Not on file  Food Insecurity: Not on file  Transportation Needs: Not on file  Physical Activity: Not on file  Stress: Not on file  Social  Connections: Unknown (11/28/2021)   [facility]   Social Network    Social Network: Not on file    No Known Allergies   Current Outpatient Medications:    acetaminophen (TYLENOL) 325 MG tablet, Take 2 tablets (650 mg total) by mouth every 6 (six) hours as needed for moderate pain., Disp: 30 tablet, Rfl: 0   amiodarone (PACERONE) 200 MG tablet, TAKE 1 TABLET(200 MG) BY MOUTH DAILY, Disp: 90 tablet, Rfl: 3   apixaban (ELIQUIS) 2.5 MG TABS tablet, Take 1 tablet (2.5 mg total) by mouth 2 (two) times daily., Disp: 180 tablet, Rfl: 1   atorvastatin (LIPITOR) 20 MG tablet, TAKE 1 TABLET(20 MG) BY MOUTH DAILY, Disp: 90 tablet, Rfl: 3   Boswellia Serrata (BOSWELLIA PO), Take 1,200 mg by mouth daily., Disp: , Rfl:    carvedilol (COREG) 3.125 MG tablet, Take 3.125 mg by mouth daily., Disp: ,  Rfl:    furosemide (LASIX) 20 MG tablet, Take 1 tablet (20 mg total) by mouth daily. (Patient taking differently: Take 20 mg by mouth every other day.), Disp: , Rfl:    lidocaine (LIDODERM) 5 %, Place 1 patch onto the skin daily., Disp: , Rfl:    Misc Natural Products (GLUCOSAMINE CHONDROITIN TRIPLE) TABS, Take 1 tablet by mouth daily., Disp: , Rfl:    Multiple Vitamin (MULTIVITAMIN WITH MINERALS) TABS tablet, Take 1 tablet by mouth daily., Disp: , Rfl:    Omega-3 Fatty Acids (FISH OIL) 1200 MG CAPS, Take 1,200 mg by mouth daily., Disp: , Rfl:    Review of Systems  Constitutional:  Negative for activity change, appetite change, chills, diaphoresis, fatigue, fever and unexpected weight change.  HENT:  Negative for congestion, rhinorrhea, sinus pressure, sneezing, sore throat and trouble swallowing.   Eyes:  Negative for photophobia and visual disturbance.  Respiratory:  Negative for cough, chest tightness, shortness of breath, wheezing and stridor.   Cardiovascular:  Negative for chest pain, palpitations and leg swelling.  Gastrointestinal:  Negative for abdominal distention,  abdominal pain, anal bleeding, blood in stool, constipation, diarrhea, nausea and vomiting.  Genitourinary:  Negative for difficulty urinating, dysuria, flank pain and hematuria.  Musculoskeletal:  Negative for arthralgias, back pain, gait problem, joint swelling and myalgias.  Skin:  Positive for wound. Negative for color change, pallor and rash.  Neurological:  Negative for dizziness, tremors, weakness and light-headedness.  Hematological:  Negative for adenopathy. Does not bruise/bleed easily.  Psychiatric/Behavioral:  Negative for agitation, behavioral problems, confusion, decreased concentration, dysphoric mood and sleep disturbance.        Objective:   Physical Exam Constitutional:      Appearance: He is well-developed.  HENT:     Head: Normocephalic and atraumatic.  Eyes:     Conjunctiva/sclera: Conjunctivae normal.  Cardiovascular:     Rate and Rhythm: Normal rate and regular rhythm.  Pulmonary:     Effort: Pulmonary effort is normal. No respiratory distress.     Breath sounds: No wheezing.  Abdominal:     General: There is no distension.     Palpations: Abdomen is soft.  Musculoskeletal:        General: No tenderness. Normal range of motion.     Cervical back: Normal range of motion and neck supple.  Skin:    General: Skin is warm and dry.     Coloration: Skin is not pale.     Findings: No erythema or rash.  Neurological:     General: No focal deficit present.     Mental Status: He is alert and oriented to person, place, and time.  Psychiatric:        Mood and Affect: Mood normal.        Behavior: Behavior normal.        Thought Content: Thought content normal.        Judgment: Judgment normal.    RIght cheek 09/01/2023:    Left cheek 09/01/2023:     Leg      Assessment & Plan:   M chelonae  nfection: This appears to have been cured.  Lesions on the left side of his face have encouraged him to see Dr. Swaziland with dermatology do is evaluate  these  Lower extremity ulcer on the right side in the context of peripheral vascular disease being followed by wound care and vascular.   QT prolongation: Was an issue while he was on macrolide therapy along with  his amiodarone  Atrial fibrillation remains on amiodarone and followed closely by cardiology.  Vaccine counseling appears up-to-date on all vaccines.  Follow-up in clinic as needed

## 2023-09-01 ENCOUNTER — Other Ambulatory Visit: Payer: Self-pay

## 2023-09-01 ENCOUNTER — Ambulatory Visit: Payer: Medicare HMO | Admitting: Infectious Disease

## 2023-09-01 VITALS — Resp 16 | Ht 74.0 in | Wt 164.0 lb

## 2023-09-01 DIAGNOSIS — A318 Other mycobacterial infections: Secondary | ICD-10-CM

## 2023-09-01 DIAGNOSIS — R9431 Abnormal electrocardiogram [ECG] [EKG]: Secondary | ICD-10-CM

## 2023-09-01 DIAGNOSIS — L989 Disorder of the skin and subcutaneous tissue, unspecified: Secondary | ICD-10-CM

## 2023-09-01 DIAGNOSIS — Z7185 Encounter for immunization safety counseling: Secondary | ICD-10-CM

## 2023-09-01 DIAGNOSIS — I480 Paroxysmal atrial fibrillation: Secondary | ICD-10-CM

## 2023-09-01 DIAGNOSIS — I739 Peripheral vascular disease, unspecified: Secondary | ICD-10-CM

## 2023-09-01 DIAGNOSIS — S81801A Unspecified open wound, right lower leg, initial encounter: Secondary | ICD-10-CM

## 2023-09-02 ENCOUNTER — Telehealth: Payer: Self-pay

## 2023-09-02 NOTE — Telephone Encounter (Signed)
Spoke with the patient's daughter at length. Apologized for the wait in scheduling LAAO as the schedule was not finalized. She reported he will not have his endovenous laser ablation 09/09/2023 as he has an open would on his leg.  He is being followed by [facility] wound care and has HH nurses come to [place] once weekly. She reported Vascular wants the patient to have LAAO. She understands the wound will likely need to be healed prior to procedure. The patient has follow-up with his wound MD 2/18. She will call after that for an update.  In the meantime, she wishes to tentatively scheduled the procedure 11/11/2023. She was grateful for call and agreed with plan.

## 2023-09-03 ENCOUNTER — Encounter: Payer: Self-pay | Admitting: Surgery

## 2023-09-03 DIAGNOSIS — I472 Ventricular tachycardia, unspecified: Secondary | ICD-10-CM | POA: Diagnosis not present

## 2023-09-03 DIAGNOSIS — I5032 Chronic diastolic (congestive) heart failure: Secondary | ICD-10-CM | POA: Diagnosis not present

## 2023-09-03 DIAGNOSIS — M797 Fibromyalgia: Secondary | ICD-10-CM | POA: Diagnosis not present

## 2023-09-03 DIAGNOSIS — I77810 Thoracic aortic ectasia: Secondary | ICD-10-CM | POA: Diagnosis not present

## 2023-09-03 DIAGNOSIS — I2510 Atherosclerotic heart disease of native coronary artery without angina pectoris: Secondary | ICD-10-CM | POA: Diagnosis not present

## 2023-09-03 DIAGNOSIS — I872 Venous insufficiency (chronic) (peripheral): Secondary | ICD-10-CM | POA: Diagnosis not present

## 2023-09-03 DIAGNOSIS — L97211 Non-pressure chronic ulcer of right calf limited to breakdown of skin: Secondary | ICD-10-CM | POA: Diagnosis not present

## 2023-09-03 DIAGNOSIS — I480 Paroxysmal atrial fibrillation: Secondary | ICD-10-CM | POA: Diagnosis not present

## 2023-09-03 DIAGNOSIS — I110 Hypertensive heart disease with heart failure: Secondary | ICD-10-CM | POA: Diagnosis not present

## 2023-09-04 DIAGNOSIS — I110 Hypertensive heart disease with heart failure: Secondary | ICD-10-CM | POA: Diagnosis not present

## 2023-09-04 DIAGNOSIS — M797 Fibromyalgia: Secondary | ICD-10-CM | POA: Diagnosis not present

## 2023-09-04 DIAGNOSIS — I77810 Thoracic aortic ectasia: Secondary | ICD-10-CM | POA: Diagnosis not present

## 2023-09-04 DIAGNOSIS — I472 Ventricular tachycardia, unspecified: Secondary | ICD-10-CM | POA: Diagnosis not present

## 2023-09-04 DIAGNOSIS — I2510 Atherosclerotic heart disease of native coronary artery without angina pectoris: Secondary | ICD-10-CM | POA: Diagnosis not present

## 2023-09-04 DIAGNOSIS — L97211 Non-pressure chronic ulcer of right calf limited to breakdown of skin: Secondary | ICD-10-CM | POA: Diagnosis not present

## 2023-09-04 DIAGNOSIS — I5032 Chronic diastolic (congestive) heart failure: Secondary | ICD-10-CM | POA: Diagnosis not present

## 2023-09-04 DIAGNOSIS — I872 Venous insufficiency (chronic) (peripheral): Secondary | ICD-10-CM | POA: Diagnosis not present

## 2023-09-04 DIAGNOSIS — I480 Paroxysmal atrial fibrillation: Secondary | ICD-10-CM | POA: Diagnosis not present

## 2023-09-07 DIAGNOSIS — I4891 Unspecified atrial fibrillation: Secondary | ICD-10-CM | POA: Diagnosis not present

## 2023-09-07 DIAGNOSIS — Z7901 Long term (current) use of anticoagulants: Secondary | ICD-10-CM | POA: Diagnosis not present

## 2023-09-07 DIAGNOSIS — I83012 Varicose veins of right lower extremity with ulcer of calf: Secondary | ICD-10-CM | POA: Diagnosis not present

## 2023-09-07 DIAGNOSIS — L97812 Non-pressure chronic ulcer of other part of right lower leg with fat layer exposed: Secondary | ICD-10-CM | POA: Diagnosis not present

## 2023-09-07 DIAGNOSIS — L97212 Non-pressure chronic ulcer of right calf with fat layer exposed: Secondary | ICD-10-CM | POA: Diagnosis not present

## 2023-09-07 DIAGNOSIS — I110 Hypertensive heart disease with heart failure: Secondary | ICD-10-CM | POA: Diagnosis not present

## 2023-09-07 DIAGNOSIS — I872 Venous insufficiency (chronic) (peripheral): Secondary | ICD-10-CM | POA: Diagnosis not present

## 2023-09-07 DIAGNOSIS — I509 Heart failure, unspecified: Secondary | ICD-10-CM | POA: Diagnosis not present

## 2023-09-08 ENCOUNTER — Ambulatory Visit
Admission: RE | Admit: 2023-09-08 | Discharge: 2023-09-08 | Disposition: A | Discharge status: Home / Self Care | Payer: Medicare HMO | Source: Ambulatory Visit | Attending: Surgery | Admitting: Surgery

## 2023-09-08 DIAGNOSIS — I7121 Aneurysm of the ascending aorta, without rupture: Secondary | ICD-10-CM

## 2023-09-08 DIAGNOSIS — I700 Atherosclerosis of aorta: Secondary | ICD-10-CM | POA: Diagnosis not present

## 2023-09-08 MED ORDER — IOPAMIDOL (ISOVUE-370) INJECTION 76%
75.0000 mL | Freq: Once | INTRAVENOUS | Status: AC | PRN
Start: 2023-09-08 — End: 2023-09-08
  Administered 2023-09-08: 75 mL via INTRAVENOUS

## 2023-09-09 ENCOUNTER — Other Ambulatory Visit: Payer: Medicare HMO | Admitting: Vascular Surgery

## 2023-09-10 DIAGNOSIS — I110 Hypertensive heart disease with heart failure: Secondary | ICD-10-CM | POA: Diagnosis not present

## 2023-09-10 DIAGNOSIS — I472 Ventricular tachycardia, unspecified: Secondary | ICD-10-CM | POA: Diagnosis not present

## 2023-09-10 DIAGNOSIS — I2510 Atherosclerotic heart disease of native coronary artery without angina pectoris: Secondary | ICD-10-CM | POA: Diagnosis not present

## 2023-09-10 DIAGNOSIS — I5032 Chronic diastolic (congestive) heart failure: Secondary | ICD-10-CM | POA: Diagnosis not present

## 2023-09-10 DIAGNOSIS — M797 Fibromyalgia: Secondary | ICD-10-CM | POA: Diagnosis not present

## 2023-09-10 DIAGNOSIS — I480 Paroxysmal atrial fibrillation: Secondary | ICD-10-CM | POA: Diagnosis not present

## 2023-09-10 DIAGNOSIS — I77810 Thoracic aortic ectasia: Secondary | ICD-10-CM | POA: Diagnosis not present

## 2023-09-10 DIAGNOSIS — I872 Venous insufficiency (chronic) (peripheral): Secondary | ICD-10-CM | POA: Diagnosis not present

## 2023-09-10 DIAGNOSIS — L97211 Non-pressure chronic ulcer of right calf limited to breakdown of skin: Secondary | ICD-10-CM | POA: Diagnosis not present

## 2023-09-14 DIAGNOSIS — L97212 Non-pressure chronic ulcer of right calf with fat layer exposed: Secondary | ICD-10-CM | POA: Diagnosis not present

## 2023-09-14 DIAGNOSIS — I509 Heart failure, unspecified: Secondary | ICD-10-CM | POA: Diagnosis not present

## 2023-09-14 DIAGNOSIS — Z7901 Long term (current) use of anticoagulants: Secondary | ICD-10-CM | POA: Diagnosis not present

## 2023-09-14 DIAGNOSIS — I4891 Unspecified atrial fibrillation: Secondary | ICD-10-CM | POA: Diagnosis not present

## 2023-09-14 DIAGNOSIS — I83012 Varicose veins of right lower extremity with ulcer of calf: Secondary | ICD-10-CM | POA: Diagnosis not present

## 2023-09-14 DIAGNOSIS — I110 Hypertensive heart disease with heart failure: Secondary | ICD-10-CM | POA: Diagnosis not present

## 2023-09-14 DIAGNOSIS — L97812 Non-pressure chronic ulcer of other part of right lower leg with fat layer exposed: Secondary | ICD-10-CM | POA: Diagnosis not present

## 2023-09-14 DIAGNOSIS — I872 Venous insufficiency (chronic) (peripheral): Secondary | ICD-10-CM | POA: Diagnosis not present

## 2023-09-15 ENCOUNTER — Ambulatory Visit: Payer: Medicare HMO | Admitting: Surgery

## 2023-09-15 ENCOUNTER — Encounter: Payer: Self-pay | Admitting: Surgery

## 2023-09-15 VITALS — BP 168/98 | HR 109 | Resp 18 | Ht 74.0 in | Wt 152.0 lb

## 2023-09-15 DIAGNOSIS — I7121 Aneurysm of the ascending aorta, without rupture: Secondary | ICD-10-CM | POA: Diagnosis not present

## 2023-09-15 NOTE — Progress Notes (Signed)
 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  He is here today with his daughter.  He denies any chest or back pain.  His main problems lately have been related to his lower extremity venous insufficiency resulting in ulcers in the right leg and episodes of bleeding from them related to being on Eliquis.  He has required transfusion for this.  He said that he is scheduled to have a Watchman device implanted so that he can get off Eliquis.  He is also scheduled to undergo saphenous vein ablation for his right lower extremity venous insufficiency with ulcers.  Current Outpatient Medications  Medication Sig Dispense Refill   acetaminophen (TYLENOL) 325 MG tablet Take 2 tablets (650 mg total) by mouth every 6 (six) hours as needed for moderate pain. 30 tablet 0   amiodarone (PACERONE) 200 MG tablet TAKE 1 TABLET(200 MG) BY MOUTH DAILY 90 tablet 3   apixaban (ELIQUIS) 2.5 MG TABS tablet Take 1 tablet (2.5 mg total) by mouth 2 (two) times daily. 180 tablet 1   atorvastatin (LIPITOR) 20 MG tablet TAKE 1 TABLET(20 MG) BY MOUTH DAILY 90 tablet 3   Boswellia Serrata (BOSWELLIA PO) Take 1,200 mg by mouth daily.     carvedilol (COREG) 3.125 MG tablet Take 3.125 mg by mouth daily.     cephALEXin (KEFLEX) 250 MG capsule Take 1 tablet three times a day for 7 days     lidocaine (LIDODERM) 5 % Place 1 patch onto the skin daily.     Misc Natural Products (GLUCOSAMINE CHONDROITIN TRIPLE) TABS Take 1 tablet by mouth daily.     Multiple Vitamin (MULTIVITAMIN WITH MINERALS) TABS tablet Take 1 tablet by mouth daily.     Omega-3 Fatty Acids (FISH OIL) 1200 MG CAPS Take 1,200 mg by mouth daily.     furosemide (LASIX) 20 MG tablet Take 1 tablet (20 mg total)  by mouth daily. (Patient taking differently: Take 20 mg by mouth every other day.)     No current facility-administered medications for this visit.     Physical Exam: BP (!) 168/98 (BP Location: Left Arm)   Pulse (!) 109   Resp 18   Ht 6\' 2"  (1.88 m)   Wt 152 lb (68.9 kg)   SpO2 98% Comment: RA  BMI 19.52 kg/m  He is a frail-appearing elderly gentleman in no distress. Cardiac exam shows regular rate and rhythm with normal heart sounds.  There is no murmur. Lungs are clear. He has compression dressings on the right lower extremity for venous stasis.  Diagnostic Tests:  Narrative & Impression  CLINICAL DATA:  Thoracic aortic aneurysm.   EXAM: CT ANGIOGRAPHY CHEST WITH CONTRAST   TECHNIQUE: Multidetector CT imaging of the chest was performed using the standard protocol during bolus administration of intravenous contrast. Multiplanar CT image reconstructions and MIPs were obtained to evaluate the vascular anatomy.   RADIATION DOSE REDUCTION: This exam was performed according to the departmental dose-optimization program which includes automated exposure control, adjustment of the mA and/or kV according to patient size and/or use of iterative reconstruction technique.   CONTRAST:  75mL ISOVUE-370 IOPAMIDOL (ISOVUE-370) INJECTION 76%   COMPARISON:  February 28, 2023.   FINDINGS: Cardiovascular: Grossly stable 4.6 cm ascending thoracic aortic aneurysm. No dissection is noted. Great vessels  are widely patent. Normal cardiac size. No pericardial effusion. Coronary artery calcifications are noted.   Mediastinum/Nodes: No enlarged mediastinal, hilar, or axillary lymph nodes. Thyroid gland, trachea, and esophagus demonstrate no significant findings.   Lungs/Pleura: Moderate size right pleural effusion is noted with minimal adjacent subsegmental atelectasis. Minimal left pleural effusion is noted with minimal adjacent subsegmental atelectasis. No pneumothorax is noted.    Upper Abdomen: No acute abnormality.   Musculoskeletal: No chest wall abnormality. No acute or significant osseous findings.   Review of the MIP images confirms the above findings.   IMPRESSION: Grossly stable 4.6 cm Ascending thoracic aortic aneurysm. Recommend semi-annual imaging followup by CTA or MRA and referral to cardiothoracic surgery if not already obtained. This recommendation follows 2010 ACCF/AHA/AATS/ACR/ASA/SCA/SCAI/SIR/STS/SVM Guidelines for the Diagnosis and Management of Patients With Thoracic Aortic Disease. Circulation. 2010; 121: U045-W098. Aortic aneurysm NOS (ICD10-I71.9).   Bilateral pleural effusions are noted with adjacent subsegmental atelectasis, right greater than left.   Coronary artery calcifications are noted suggesting coronary artery disease.   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     Electronically Signed   By: Lupita Raider M.D.   On: 09/08/2023 12:28    Impression:  His ascending aortic aneurysm is stable at 4.6 cm.  This is unchanged compared to last year and still well below the surgical threshold of 5.5 cm.  Given his advanced age and frailty I do not think he would be a candidate for surgical repair regardless of the size.  I reviewed the CT images with the patient and his daughter and answered their questions. I stressed the importance of continued good blood pressure control in preventing further enlargement and acute aortic dissection.  I advised him against doing any heavy lifting that may require a Valsalva maneuver and could suddenly raise his blood pressure to high levels.   Plan:  He will return to see me in 2 years with a CT of the chest without contrast for aortic surveillance.  I spent 15 minutes performing this established patient evaluation and > 50% of this time was spent face to face counseling and coordinating the care of this patient's aortic aneurysm.    Alleen Borne, MD [facility] [phone]

## 2023-09-16 NOTE — Telephone Encounter (Signed)
 Sandy called today. Left message that the patient still has wounds on his legs.  Called Forbes back. Left message that she will be called Monday to discuss plan. We may schedule Ms. Brazel back with Dr. Lalla Brothers to assess prior to proceeding with LAAO.

## 2023-09-17 DIAGNOSIS — I77810 Thoracic aortic ectasia: Secondary | ICD-10-CM | POA: Diagnosis not present

## 2023-09-17 DIAGNOSIS — I5032 Chronic diastolic (congestive) heart failure: Secondary | ICD-10-CM | POA: Diagnosis not present

## 2023-09-17 DIAGNOSIS — M797 Fibromyalgia: Secondary | ICD-10-CM | POA: Diagnosis not present

## 2023-09-17 DIAGNOSIS — I480 Paroxysmal atrial fibrillation: Secondary | ICD-10-CM | POA: Diagnosis not present

## 2023-09-17 DIAGNOSIS — I2510 Atherosclerotic heart disease of native coronary artery without angina pectoris: Secondary | ICD-10-CM | POA: Diagnosis not present

## 2023-09-17 DIAGNOSIS — I110 Hypertensive heart disease with heart failure: Secondary | ICD-10-CM | POA: Diagnosis not present

## 2023-09-17 DIAGNOSIS — I872 Venous insufficiency (chronic) (peripheral): Secondary | ICD-10-CM | POA: Diagnosis not present

## 2023-09-17 DIAGNOSIS — L97211 Non-pressure chronic ulcer of right calf limited to breakdown of skin: Secondary | ICD-10-CM | POA: Diagnosis not present

## 2023-09-17 DIAGNOSIS — I472 Ventricular tachycardia, unspecified: Secondary | ICD-10-CM | POA: Diagnosis not present

## 2023-09-20 NOTE — Telephone Encounter (Signed)
Left message for Sandy to call back

## 2023-09-21 DIAGNOSIS — F17210 Nicotine dependence, cigarettes, uncomplicated: Secondary | ICD-10-CM | POA: Diagnosis not present

## 2023-09-21 DIAGNOSIS — I2510 Atherosclerotic heart disease of native coronary artery without angina pectoris: Secondary | ICD-10-CM | POA: Diagnosis not present

## 2023-09-21 DIAGNOSIS — I739 Peripheral vascular disease, unspecified: Secondary | ICD-10-CM | POA: Diagnosis not present

## 2023-09-21 DIAGNOSIS — I110 Hypertensive heart disease with heart failure: Secondary | ICD-10-CM | POA: Diagnosis not present

## 2023-09-21 DIAGNOSIS — I4891 Unspecified atrial fibrillation: Secondary | ICD-10-CM | POA: Diagnosis not present

## 2023-09-21 DIAGNOSIS — I509 Heart failure, unspecified: Secondary | ICD-10-CM | POA: Diagnosis not present

## 2023-09-21 DIAGNOSIS — Z7901 Long term (current) use of anticoagulants: Secondary | ICD-10-CM | POA: Diagnosis not present

## 2023-09-21 DIAGNOSIS — L97812 Non-pressure chronic ulcer of other part of right lower leg with fat layer exposed: Secondary | ICD-10-CM | POA: Diagnosis not present

## 2023-09-21 DIAGNOSIS — E8770 Fluid overload, unspecified: Secondary | ICD-10-CM | POA: Diagnosis not present

## 2023-09-21 DIAGNOSIS — I872 Venous insufficiency (chronic) (peripheral): Secondary | ICD-10-CM | POA: Diagnosis not present

## 2023-09-22 ENCOUNTER — Encounter (HOSPITAL_COMMUNITY): Payer: Medicare HMO

## 2023-09-22 ENCOUNTER — Encounter: Payer: Medicare HMO | Admitting: Vascular Surgery

## 2023-09-22 ENCOUNTER — Encounter (HOSPITAL_BASED_OUTPATIENT_CLINIC_OR_DEPARTMENT_OTHER): Payer: Medicare HMO | Admitting: Internal Medicine

## 2023-09-23 NOTE — Telephone Encounter (Signed)
 Spoke with Sandy at length. She reports her dad's leg wounds are improving. He does have swelling and his Lasix was increased, but the wounds are getting better. She is extremely hopeful for LAAO date of 11/11/2023. Rescheduled the patient for check-up with Dr. Lalla Brothers 10/20/2023. She understands Dr. Lalla Brothers will decide at that time if he can proceed or if he needs to wait. She was grateful for call and agreed with plan.

## 2023-09-24 DIAGNOSIS — I77810 Thoracic aortic ectasia: Secondary | ICD-10-CM | POA: Diagnosis not present

## 2023-09-24 DIAGNOSIS — M797 Fibromyalgia: Secondary | ICD-10-CM | POA: Diagnosis not present

## 2023-09-24 DIAGNOSIS — I472 Ventricular tachycardia, unspecified: Secondary | ICD-10-CM | POA: Diagnosis not present

## 2023-09-24 DIAGNOSIS — I5032 Chronic diastolic (congestive) heart failure: Secondary | ICD-10-CM | POA: Diagnosis not present

## 2023-09-24 DIAGNOSIS — I110 Hypertensive heart disease with heart failure: Secondary | ICD-10-CM | POA: Diagnosis not present

## 2023-09-24 DIAGNOSIS — I480 Paroxysmal atrial fibrillation: Secondary | ICD-10-CM | POA: Diagnosis not present

## 2023-09-24 DIAGNOSIS — I872 Venous insufficiency (chronic) (peripheral): Secondary | ICD-10-CM | POA: Diagnosis not present

## 2023-09-24 DIAGNOSIS — I2510 Atherosclerotic heart disease of native coronary artery without angina pectoris: Secondary | ICD-10-CM | POA: Diagnosis not present

## 2023-09-24 DIAGNOSIS — L97211 Non-pressure chronic ulcer of right calf limited to breakdown of skin: Secondary | ICD-10-CM | POA: Diagnosis not present

## 2023-09-28 DIAGNOSIS — I4891 Unspecified atrial fibrillation: Secondary | ICD-10-CM | POA: Diagnosis not present

## 2023-09-28 DIAGNOSIS — Z7901 Long term (current) use of anticoagulants: Secondary | ICD-10-CM | POA: Diagnosis not present

## 2023-09-28 DIAGNOSIS — I872 Venous insufficiency (chronic) (peripheral): Secondary | ICD-10-CM | POA: Diagnosis not present

## 2023-09-28 DIAGNOSIS — L97812 Non-pressure chronic ulcer of other part of right lower leg with fat layer exposed: Secondary | ICD-10-CM | POA: Diagnosis not present

## 2023-09-28 DIAGNOSIS — I2510 Atherosclerotic heart disease of native coronary artery without angina pectoris: Secondary | ICD-10-CM | POA: Diagnosis not present

## 2023-09-28 DIAGNOSIS — F17210 Nicotine dependence, cigarettes, uncomplicated: Secondary | ICD-10-CM | POA: Diagnosis not present

## 2023-09-28 DIAGNOSIS — I509 Heart failure, unspecified: Secondary | ICD-10-CM | POA: Diagnosis not present

## 2023-09-28 DIAGNOSIS — I739 Peripheral vascular disease, unspecified: Secondary | ICD-10-CM | POA: Diagnosis not present

## 2023-09-28 DIAGNOSIS — I110 Hypertensive heart disease with heart failure: Secondary | ICD-10-CM | POA: Diagnosis not present

## 2023-09-29 DIAGNOSIS — I480 Paroxysmal atrial fibrillation: Secondary | ICD-10-CM | POA: Diagnosis not present

## 2023-09-29 DIAGNOSIS — M47817 Spondylosis without myelopathy or radiculopathy, lumbosacral region: Secondary | ICD-10-CM | POA: Diagnosis not present

## 2023-09-29 DIAGNOSIS — I872 Venous insufficiency (chronic) (peripheral): Secondary | ICD-10-CM | POA: Diagnosis not present

## 2023-09-29 DIAGNOSIS — R600 Localized edema: Secondary | ICD-10-CM | POA: Diagnosis not present

## 2023-09-29 DIAGNOSIS — I5032 Chronic diastolic (congestive) heart failure: Secondary | ICD-10-CM | POA: Diagnosis not present

## 2023-10-01 DIAGNOSIS — M797 Fibromyalgia: Secondary | ICD-10-CM | POA: Diagnosis not present

## 2023-10-01 DIAGNOSIS — I872 Venous insufficiency (chronic) (peripheral): Secondary | ICD-10-CM | POA: Diagnosis not present

## 2023-10-01 DIAGNOSIS — I110 Hypertensive heart disease with heart failure: Secondary | ICD-10-CM | POA: Diagnosis not present

## 2023-10-01 DIAGNOSIS — L97211 Non-pressure chronic ulcer of right calf limited to breakdown of skin: Secondary | ICD-10-CM | POA: Diagnosis not present

## 2023-10-01 DIAGNOSIS — I2510 Atherosclerotic heart disease of native coronary artery without angina pectoris: Secondary | ICD-10-CM | POA: Diagnosis not present

## 2023-10-01 DIAGNOSIS — I480 Paroxysmal atrial fibrillation: Secondary | ICD-10-CM | POA: Diagnosis not present

## 2023-10-01 DIAGNOSIS — I77810 Thoracic aortic ectasia: Secondary | ICD-10-CM | POA: Diagnosis not present

## 2023-10-01 DIAGNOSIS — I5032 Chronic diastolic (congestive) heart failure: Secondary | ICD-10-CM | POA: Diagnosis not present

## 2023-10-01 DIAGNOSIS — I472 Ventricular tachycardia, unspecified: Secondary | ICD-10-CM | POA: Diagnosis not present

## 2023-10-05 DIAGNOSIS — Z7901 Long term (current) use of anticoagulants: Secondary | ICD-10-CM | POA: Diagnosis not present

## 2023-10-05 DIAGNOSIS — I2510 Atherosclerotic heart disease of native coronary artery without angina pectoris: Secondary | ICD-10-CM | POA: Diagnosis not present

## 2023-10-05 DIAGNOSIS — I110 Hypertensive heart disease with heart failure: Secondary | ICD-10-CM | POA: Diagnosis not present

## 2023-10-05 DIAGNOSIS — R2243 Localized swelling, mass and lump, lower limb, bilateral: Secondary | ICD-10-CM | POA: Diagnosis not present

## 2023-10-05 DIAGNOSIS — F17210 Nicotine dependence, cigarettes, uncomplicated: Secondary | ICD-10-CM | POA: Diagnosis not present

## 2023-10-05 DIAGNOSIS — L97812 Non-pressure chronic ulcer of other part of right lower leg with fat layer exposed: Secondary | ICD-10-CM | POA: Diagnosis not present

## 2023-10-05 DIAGNOSIS — I872 Venous insufficiency (chronic) (peripheral): Secondary | ICD-10-CM | POA: Diagnosis not present

## 2023-10-05 DIAGNOSIS — I739 Peripheral vascular disease, unspecified: Secondary | ICD-10-CM | POA: Diagnosis not present

## 2023-10-05 DIAGNOSIS — I4891 Unspecified atrial fibrillation: Secondary | ICD-10-CM | POA: Diagnosis not present

## 2023-10-05 DIAGNOSIS — I509 Heart failure, unspecified: Secondary | ICD-10-CM | POA: Diagnosis not present

## 2023-10-06 ENCOUNTER — Telehealth: Payer: Self-pay | Admitting: *Deleted

## 2023-10-06 NOTE — Telephone Encounter (Signed)
 Spoke with Mr. Ditmer daughter, Stark Klein (emergency contact) to get update on Mr. Mihalic;s venous stasis ulcer of RLE.  Stark Klein states he is being treated by Howie Ill MD/[facility] and home health visits.  States his wound is approximately 50 % healed. States he had recent episode of pitting edema with seepage of clear fluid of RLE and was put on oral Lasix. Stark Klein states the seepage of clear fluid from RLE has now stopped and edema of RLE has lessened.  Stark Klein states she will call me when RLE venous ulceration has healed.  Then, I will initiate new prior authorization for endovenous laser ablation of right greater saphenous vein.  Stark Klein verbalized understanding and is agreement with the plan.

## 2023-10-08 DIAGNOSIS — I77810 Thoracic aortic ectasia: Secondary | ICD-10-CM | POA: Diagnosis not present

## 2023-10-08 DIAGNOSIS — I872 Venous insufficiency (chronic) (peripheral): Secondary | ICD-10-CM | POA: Diagnosis not present

## 2023-10-08 DIAGNOSIS — I480 Paroxysmal atrial fibrillation: Secondary | ICD-10-CM | POA: Diagnosis not present

## 2023-10-08 DIAGNOSIS — I2510 Atherosclerotic heart disease of native coronary artery without angina pectoris: Secondary | ICD-10-CM | POA: Diagnosis not present

## 2023-10-08 DIAGNOSIS — I110 Hypertensive heart disease with heart failure: Secondary | ICD-10-CM | POA: Diagnosis not present

## 2023-10-08 DIAGNOSIS — M797 Fibromyalgia: Secondary | ICD-10-CM | POA: Diagnosis not present

## 2023-10-08 DIAGNOSIS — I5032 Chronic diastolic (congestive) heart failure: Secondary | ICD-10-CM | POA: Diagnosis not present

## 2023-10-08 DIAGNOSIS — L97211 Non-pressure chronic ulcer of right calf limited to breakdown of skin: Secondary | ICD-10-CM | POA: Diagnosis not present

## 2023-10-08 DIAGNOSIS — I472 Ventricular tachycardia, unspecified: Secondary | ICD-10-CM | POA: Diagnosis not present

## 2023-10-12 DIAGNOSIS — I480 Paroxysmal atrial fibrillation: Secondary | ICD-10-CM | POA: Diagnosis not present

## 2023-10-12 DIAGNOSIS — I2510 Atherosclerotic heart disease of native coronary artery without angina pectoris: Secondary | ICD-10-CM | POA: Diagnosis not present

## 2023-10-12 DIAGNOSIS — I472 Ventricular tachycardia, unspecified: Secondary | ICD-10-CM | POA: Diagnosis not present

## 2023-10-12 DIAGNOSIS — M797 Fibromyalgia: Secondary | ICD-10-CM | POA: Diagnosis not present

## 2023-10-12 DIAGNOSIS — I77810 Thoracic aortic ectasia: Secondary | ICD-10-CM | POA: Diagnosis not present

## 2023-10-12 DIAGNOSIS — L97212 Non-pressure chronic ulcer of right calf with fat layer exposed: Secondary | ICD-10-CM | POA: Diagnosis not present

## 2023-10-12 DIAGNOSIS — I872 Venous insufficiency (chronic) (peripheral): Secondary | ICD-10-CM | POA: Diagnosis not present

## 2023-10-12 DIAGNOSIS — I5032 Chronic diastolic (congestive) heart failure: Secondary | ICD-10-CM | POA: Diagnosis not present

## 2023-10-12 DIAGNOSIS — I110 Hypertensive heart disease with heart failure: Secondary | ICD-10-CM | POA: Diagnosis not present

## 2023-10-14 DIAGNOSIS — L97212 Non-pressure chronic ulcer of right calf with fat layer exposed: Secondary | ICD-10-CM | POA: Diagnosis not present

## 2023-10-14 DIAGNOSIS — I110 Hypertensive heart disease with heart failure: Secondary | ICD-10-CM | POA: Diagnosis not present

## 2023-10-14 DIAGNOSIS — I77810 Thoracic aortic ectasia: Secondary | ICD-10-CM | POA: Diagnosis not present

## 2023-10-14 DIAGNOSIS — I480 Paroxysmal atrial fibrillation: Secondary | ICD-10-CM | POA: Diagnosis not present

## 2023-10-14 DIAGNOSIS — I5032 Chronic diastolic (congestive) heart failure: Secondary | ICD-10-CM | POA: Diagnosis not present

## 2023-10-14 DIAGNOSIS — I872 Venous insufficiency (chronic) (peripheral): Secondary | ICD-10-CM | POA: Diagnosis not present

## 2023-10-14 DIAGNOSIS — I2510 Atherosclerotic heart disease of native coronary artery without angina pectoris: Secondary | ICD-10-CM | POA: Diagnosis not present

## 2023-10-14 DIAGNOSIS — I472 Ventricular tachycardia, unspecified: Secondary | ICD-10-CM | POA: Diagnosis not present

## 2023-10-14 DIAGNOSIS — M797 Fibromyalgia: Secondary | ICD-10-CM | POA: Diagnosis not present

## 2023-10-19 DIAGNOSIS — I739 Peripheral vascular disease, unspecified: Secondary | ICD-10-CM | POA: Diagnosis not present

## 2023-10-19 DIAGNOSIS — I872 Venous insufficiency (chronic) (peripheral): Secondary | ICD-10-CM | POA: Diagnosis not present

## 2023-10-19 DIAGNOSIS — Z7901 Long term (current) use of anticoagulants: Secondary | ICD-10-CM | POA: Diagnosis not present

## 2023-10-19 DIAGNOSIS — I4891 Unspecified atrial fibrillation: Secondary | ICD-10-CM | POA: Diagnosis not present

## 2023-10-19 DIAGNOSIS — Z79899 Other long term (current) drug therapy: Secondary | ICD-10-CM | POA: Diagnosis not present

## 2023-10-19 DIAGNOSIS — L97812 Non-pressure chronic ulcer of other part of right lower leg with fat layer exposed: Secondary | ICD-10-CM | POA: Diagnosis not present

## 2023-10-19 NOTE — Progress Notes (Unsigned)
 Electrophysiology Office Follow up Visit Note:    Date:  10/20/2023   ID:  [name], DOB 02-03-1941, MRN [id number]  PCP:  Renford Dills, MD  [facility] Cardiologist:  Armanda Magic, MD  [facility] Electrophysiologist:  Lanier Prude, MD    Interval History:     [name] is a 83 y.o. male who presents for a follow up visit.   I last saw the patient June 02, 2023.  He has chronic atrial fibrillation managed with amiodarone.  His medical history also includes coronary artery disease, chronic diastolic heart failure, hypertension, hyperlipidemia.  He also has lower extremity venous stasis ulcers.  His anticoagulation for stroke prophylaxis has been complicated by symptomatic anemia requiring blood transfusion.  When I initially met him, we discussed left atrial appendage occlusion as a stroke risk mitigation strategy that would allow him to avoid long-term anticoagulation  There is a phone note from October 06, 2023 detailing a conversation with the patient's daughter who reported the wound was 50% healed.  He is with his daughter today in clinic.  His daughter who is present is recording the visit today for his other daughter who cannot be physically present.  Since I last saw him he has been taking his Lasix 40 mg by mouth twice daily.  This is helped the lower extremity edema although there is still quite a bit present.  His wounds have healed significantly but there is still 1 open wound on his lower extremity but the fat layer exposed.         Past medical, surgical, social and family history were reviewed.  ROS:   Please see the history of present illness.    All other systems reviewed and are negative.  EKGs/Labs/Other Studies Reviewed:    The following studies were reviewed today:     EKG Interpretation Date/Time:  Wednesday October 20 2023 15:04:45 EDT Ventricular Rate:  105 PR Interval:    QRS Duration:  100 QT Interval:  362 QTC  Calculation: 478 R Axis:   54  Text Interpretation: Atrial fibrillation with rapid ventricular response Confirmed by [name] [phone]) on 10/20/2023 3:40:49 PM    Physical Exam:    VS:  BP (!) 152/86   Pulse (!) 105   Ht 6\' 2"  (1.88 m)   Wt 167 lb (75.8 kg)   SpO2 99%   BMI 21.44 kg/m     Wt Readings from Last 3 Encounters:  10/20/23 167 lb (75.8 kg)  09/15/23 152 lb (68.9 kg)  09/01/23 164 lb (74.4 kg)     GEN: no distress.  Elderly. CARD: Tachycardic, irregularly irregular, No MRG.  2+ pitting edema to mid thigh bilaterally. RESP: No IWOB. CTAB. Extremities: Right lower extremity with compression/wrap/bandage.       ASSESSMENT:    1. Persistent atrial fibrillation (HCC)   2. Chronic heart failure with preserved ejection fraction (HCC)    PLAN:    In order of problems listed above:  #Persistent atrial fibrillation Rapidly conducted.  On Eliquis for stroke prophylaxis.  No obvious signs of bleeding at this time.  I will increase his Coreg to 6.25 mg by mouth twice daily to help control his rates better. We discussed the Watchman procedure during today's clinic appointment.  Given his comorbidities, lower extremity wounds, I do not think he is a candidate for left atrial appendage occlusion.   #Chronic diastolic heart failure NYHA class II-III.  Warm and volume overloaded on exam.  He  still has a significant amount of lower extremity edema on physical exam.  I like to continue his Lasix twice daily at the 40 mg dose.  I will check a BMP and magnesium level today to make sure that those are okay at the higher diuretic dosage.  I would like him to be seen in clinic in the next 2 to 3 weeks with one of the general cardiology APP's to confirm his volume status continues to improve.   Follow-up with EP on an as-needed basis.  Continue routine follow-up with general cardiology.  Signed, Jesse Dunn, MD, [facility], [facility] 10/20/2023 7:58 PM    Electrophysiology [facility]

## 2023-10-20 ENCOUNTER — Encounter: Payer: Self-pay | Admitting: Cardiology

## 2023-10-20 ENCOUNTER — Other Ambulatory Visit: Payer: Self-pay | Admitting: Cardiology

## 2023-10-20 ENCOUNTER — Other Ambulatory Visit: Payer: Self-pay

## 2023-10-20 ENCOUNTER — Ambulatory Visit: Payer: Medicare HMO | Attending: Cardiology | Admitting: Cardiology

## 2023-10-20 VITALS — BP 152/86 | HR 105 | Ht 74.0 in | Wt 167.0 lb

## 2023-10-20 DIAGNOSIS — I5032 Chronic diastolic (congestive) heart failure: Secondary | ICD-10-CM

## 2023-10-20 DIAGNOSIS — I4819 Other persistent atrial fibrillation: Secondary | ICD-10-CM

## 2023-10-20 MED ORDER — CARVEDILOL 6.25 MG PO TABS: 6.2500 mg | ORAL_TABLET | Freq: Two times a day (BID) | ORAL | 3 refills | Status: AC

## 2023-10-20 NOTE — Patient Instructions (Signed)
 Medication Instructions:  Your physician has recommended you make the following change in your medication:  1) INCREASE Coreg (carvedilol) 6.25 mg twice daily  *If you need a refill on your cardiac medications before your next appointment, please call your pharmacy*  Lab Work: TODAY: BMET and Mag  Follow-Up: At [facility], you and your health needs are our priority.  As part of our continuing mission to provide you with exceptional heart care, our providers are all part of one team.  This team includes your primary Cardiologist (physician) and Advanced Practice Providers or APPs (Physician Assistants and Nurse Practitioners) who all work together to provide you with the care you need, when you need it.  Your next appointment:   2-3 weeks with general cardiology  As needed with Dr. Lalla Brothers       1st Floor: - Lobby - Registration  - Pharmacy  - Lab - Cafe  2nd Floor: - PV Lab - Diagnostic Testing (echo, CT, nuclear med)  3rd Floor: - Vacant  4th Floor: - TCTS (cardiothoracic surgery) - AFib Clinic - Structural Heart Clinic - Vascular Surgery  - Vascular Ultrasound  5th Floor: - HeartCare Cardiology (general and EP) - Clinical Pharmacy for coumadin, hypertension, lipid, weight-loss medications, and med management appointments    Valet parking services will be available as well.

## 2023-10-21 ENCOUNTER — Encounter: Payer: Self-pay | Admitting: Cardiology

## 2023-10-21 LAB — BASIC METABOLIC PANEL WITH GFR
BUN/Creatinine Ratio: 23 (ref 10–24)
BUN/Creatinine Ratio: 19 (ref 10–24)
BUN: 25 mg/dL (ref 8–27)
BUN: 18 mg/dL (ref 8–27)
CO2: 22 mmol/L (ref 20–29)
CO2: 25 mmol/L (ref 20–29)
Calcium: 10 mg/dL (ref 8.6–10.2)
Calcium: 8.9 mg/dL (ref 8.6–10.2)
Chloride: 100 mmol/L (ref 96–106)
Chloride: 103 mmol/L (ref 96–106)
Creatinine, Ser: 1.08 mg/dL (ref 0.76–1.27)
Creatinine, Ser: 0.95 mg/dL (ref 0.76–1.27)
Glucose: 141 mg/dL — ABNORMAL HIGH (ref 70–99)
Glucose: 86 mg/dL (ref 70–99)
Potassium: 4.3 mmol/L (ref 3.5–5.2)
Potassium: 3.9 mmol/L (ref 3.5–5.2)
Sodium: 137 mmol/L (ref 134–144)
Sodium: 142 mmol/L (ref 134–144)
eGFR: 68 mL/min/{1.73_m2} (ref >59)
eGFR: 79 mL/min/{1.73_m2} (ref >59)

## 2023-10-21 LAB — MAGNESIUM
Magnesium: 2.1 mg/dL (ref 1.6–2.3)
Magnesium: 2 mg/dL (ref 1.6–2.3)

## 2023-10-22 DIAGNOSIS — I110 Hypertensive heart disease with heart failure: Secondary | ICD-10-CM | POA: Diagnosis not present

## 2023-10-22 DIAGNOSIS — L97212 Non-pressure chronic ulcer of right calf with fat layer exposed: Secondary | ICD-10-CM | POA: Diagnosis not present

## 2023-10-22 DIAGNOSIS — I480 Paroxysmal atrial fibrillation: Secondary | ICD-10-CM | POA: Diagnosis not present

## 2023-10-22 DIAGNOSIS — I2510 Atherosclerotic heart disease of native coronary artery without angina pectoris: Secondary | ICD-10-CM | POA: Diagnosis not present

## 2023-10-22 DIAGNOSIS — I77810 Thoracic aortic ectasia: Secondary | ICD-10-CM | POA: Diagnosis not present

## 2023-10-22 DIAGNOSIS — I5032 Chronic diastolic (congestive) heart failure: Secondary | ICD-10-CM | POA: Diagnosis not present

## 2023-10-22 DIAGNOSIS — I872 Venous insufficiency (chronic) (peripheral): Secondary | ICD-10-CM | POA: Diagnosis not present

## 2023-10-22 DIAGNOSIS — I472 Ventricular tachycardia, unspecified: Secondary | ICD-10-CM | POA: Diagnosis not present

## 2023-10-22 DIAGNOSIS — M797 Fibromyalgia: Secondary | ICD-10-CM | POA: Diagnosis not present

## 2023-10-26 DIAGNOSIS — I472 Ventricular tachycardia, unspecified: Secondary | ICD-10-CM | POA: Diagnosis not present

## 2023-10-26 DIAGNOSIS — M797 Fibromyalgia: Secondary | ICD-10-CM | POA: Diagnosis not present

## 2023-10-26 DIAGNOSIS — I872 Venous insufficiency (chronic) (peripheral): Secondary | ICD-10-CM | POA: Diagnosis not present

## 2023-10-26 DIAGNOSIS — I77810 Thoracic aortic ectasia: Secondary | ICD-10-CM | POA: Diagnosis not present

## 2023-10-26 DIAGNOSIS — I480 Paroxysmal atrial fibrillation: Secondary | ICD-10-CM | POA: Diagnosis not present

## 2023-10-26 DIAGNOSIS — I2510 Atherosclerotic heart disease of native coronary artery without angina pectoris: Secondary | ICD-10-CM | POA: Diagnosis not present

## 2023-10-26 DIAGNOSIS — L97212 Non-pressure chronic ulcer of right calf with fat layer exposed: Secondary | ICD-10-CM | POA: Diagnosis not present

## 2023-10-26 DIAGNOSIS — I5032 Chronic diastolic (congestive) heart failure: Secondary | ICD-10-CM | POA: Diagnosis not present

## 2023-10-26 DIAGNOSIS — I110 Hypertensive heart disease with heart failure: Secondary | ICD-10-CM | POA: Diagnosis not present

## 2023-10-29 DIAGNOSIS — I2510 Atherosclerotic heart disease of native coronary artery without angina pectoris: Secondary | ICD-10-CM | POA: Diagnosis not present

## 2023-10-29 DIAGNOSIS — I110 Hypertensive heart disease with heart failure: Secondary | ICD-10-CM | POA: Diagnosis not present

## 2023-10-29 DIAGNOSIS — M797 Fibromyalgia: Secondary | ICD-10-CM | POA: Diagnosis not present

## 2023-10-29 DIAGNOSIS — I872 Venous insufficiency (chronic) (peripheral): Secondary | ICD-10-CM | POA: Diagnosis not present

## 2023-10-29 DIAGNOSIS — I472 Ventricular tachycardia, unspecified: Secondary | ICD-10-CM | POA: Diagnosis not present

## 2023-10-29 DIAGNOSIS — L97212 Non-pressure chronic ulcer of right calf with fat layer exposed: Secondary | ICD-10-CM | POA: Diagnosis not present

## 2023-10-29 DIAGNOSIS — I5032 Chronic diastolic (congestive) heart failure: Secondary | ICD-10-CM | POA: Diagnosis not present

## 2023-10-29 DIAGNOSIS — I480 Paroxysmal atrial fibrillation: Secondary | ICD-10-CM | POA: Diagnosis not present

## 2023-10-29 DIAGNOSIS — I77810 Thoracic aortic ectasia: Secondary | ICD-10-CM | POA: Diagnosis not present

## 2023-11-02 DIAGNOSIS — Z79899 Other long term (current) drug therapy: Secondary | ICD-10-CM | POA: Diagnosis not present

## 2023-11-02 DIAGNOSIS — L97812 Non-pressure chronic ulcer of other part of right lower leg with fat layer exposed: Secondary | ICD-10-CM | POA: Diagnosis not present

## 2023-11-02 DIAGNOSIS — I872 Venous insufficiency (chronic) (peripheral): Secondary | ICD-10-CM | POA: Diagnosis not present

## 2023-11-02 DIAGNOSIS — I4891 Unspecified atrial fibrillation: Secondary | ICD-10-CM | POA: Diagnosis not present

## 2023-11-02 DIAGNOSIS — I739 Peripheral vascular disease, unspecified: Secondary | ICD-10-CM | POA: Diagnosis not present

## 2023-11-02 DIAGNOSIS — Z7901 Long term (current) use of anticoagulants: Secondary | ICD-10-CM | POA: Diagnosis not present

## 2023-11-05 DIAGNOSIS — I472 Ventricular tachycardia, unspecified: Secondary | ICD-10-CM | POA: Diagnosis not present

## 2023-11-05 DIAGNOSIS — I110 Hypertensive heart disease with heart failure: Secondary | ICD-10-CM | POA: Diagnosis not present

## 2023-11-05 DIAGNOSIS — L97212 Non-pressure chronic ulcer of right calf with fat layer exposed: Secondary | ICD-10-CM | POA: Diagnosis not present

## 2023-11-05 DIAGNOSIS — I2510 Atherosclerotic heart disease of native coronary artery without angina pectoris: Secondary | ICD-10-CM | POA: Diagnosis not present

## 2023-11-05 DIAGNOSIS — I872 Venous insufficiency (chronic) (peripheral): Secondary | ICD-10-CM | POA: Diagnosis not present

## 2023-11-05 DIAGNOSIS — I480 Paroxysmal atrial fibrillation: Secondary | ICD-10-CM | POA: Diagnosis not present

## 2023-11-05 DIAGNOSIS — M797 Fibromyalgia: Secondary | ICD-10-CM | POA: Diagnosis not present

## 2023-11-05 DIAGNOSIS — I77810 Thoracic aortic ectasia: Secondary | ICD-10-CM | POA: Diagnosis not present

## 2023-11-05 DIAGNOSIS — I5032 Chronic diastolic (congestive) heart failure: Secondary | ICD-10-CM | POA: Diagnosis not present

## 2023-11-09 DIAGNOSIS — I480 Paroxysmal atrial fibrillation: Secondary | ICD-10-CM | POA: Diagnosis not present

## 2023-11-09 DIAGNOSIS — I2510 Atherosclerotic heart disease of native coronary artery without angina pectoris: Secondary | ICD-10-CM | POA: Diagnosis not present

## 2023-11-09 DIAGNOSIS — I472 Ventricular tachycardia, unspecified: Secondary | ICD-10-CM | POA: Diagnosis not present

## 2023-11-09 DIAGNOSIS — M797 Fibromyalgia: Secondary | ICD-10-CM | POA: Diagnosis not present

## 2023-11-09 DIAGNOSIS — I110 Hypertensive heart disease with heart failure: Secondary | ICD-10-CM | POA: Diagnosis not present

## 2023-11-09 DIAGNOSIS — I5032 Chronic diastolic (congestive) heart failure: Secondary | ICD-10-CM | POA: Diagnosis not present

## 2023-11-09 DIAGNOSIS — I872 Venous insufficiency (chronic) (peripheral): Secondary | ICD-10-CM | POA: Diagnosis not present

## 2023-11-09 DIAGNOSIS — I77810 Thoracic aortic ectasia: Secondary | ICD-10-CM | POA: Diagnosis not present

## 2023-11-09 DIAGNOSIS — L97212 Non-pressure chronic ulcer of right calf with fat layer exposed: Secondary | ICD-10-CM | POA: Diagnosis not present

## 2023-11-12 DIAGNOSIS — I77810 Thoracic aortic ectasia: Secondary | ICD-10-CM | POA: Diagnosis not present

## 2023-11-12 DIAGNOSIS — M797 Fibromyalgia: Secondary | ICD-10-CM | POA: Diagnosis not present

## 2023-11-12 DIAGNOSIS — I872 Venous insufficiency (chronic) (peripheral): Secondary | ICD-10-CM | POA: Diagnosis not present

## 2023-11-12 DIAGNOSIS — L97212 Non-pressure chronic ulcer of right calf with fat layer exposed: Secondary | ICD-10-CM | POA: Diagnosis not present

## 2023-11-12 DIAGNOSIS — I480 Paroxysmal atrial fibrillation: Secondary | ICD-10-CM | POA: Diagnosis not present

## 2023-11-12 DIAGNOSIS — I472 Ventricular tachycardia, unspecified: Secondary | ICD-10-CM | POA: Diagnosis not present

## 2023-11-12 DIAGNOSIS — I110 Hypertensive heart disease with heart failure: Secondary | ICD-10-CM | POA: Diagnosis not present

## 2023-11-12 DIAGNOSIS — I5032 Chronic diastolic (congestive) heart failure: Secondary | ICD-10-CM | POA: Diagnosis not present

## 2023-11-12 DIAGNOSIS — I2510 Atherosclerotic heart disease of native coronary artery without angina pectoris: Secondary | ICD-10-CM | POA: Diagnosis not present

## 2023-11-16 DIAGNOSIS — I480 Paroxysmal atrial fibrillation: Secondary | ICD-10-CM | POA: Diagnosis not present

## 2023-11-16 DIAGNOSIS — I5032 Chronic diastolic (congestive) heart failure: Secondary | ICD-10-CM | POA: Diagnosis not present

## 2023-11-16 DIAGNOSIS — I472 Ventricular tachycardia, unspecified: Secondary | ICD-10-CM | POA: Diagnosis not present

## 2023-11-16 DIAGNOSIS — M797 Fibromyalgia: Secondary | ICD-10-CM | POA: Diagnosis not present

## 2023-11-16 DIAGNOSIS — I2510 Atherosclerotic heart disease of native coronary artery without angina pectoris: Secondary | ICD-10-CM | POA: Diagnosis not present

## 2023-11-16 DIAGNOSIS — I872 Venous insufficiency (chronic) (peripheral): Secondary | ICD-10-CM | POA: Diagnosis not present

## 2023-11-16 DIAGNOSIS — I77810 Thoracic aortic ectasia: Secondary | ICD-10-CM | POA: Diagnosis not present

## 2023-11-16 DIAGNOSIS — L97212 Non-pressure chronic ulcer of right calf with fat layer exposed: Secondary | ICD-10-CM | POA: Diagnosis not present

## 2023-11-16 DIAGNOSIS — I110 Hypertensive heart disease with heart failure: Secondary | ICD-10-CM | POA: Diagnosis not present

## 2023-11-18 ENCOUNTER — Other Ambulatory Visit: Payer: Self-pay

## 2023-11-18 ENCOUNTER — Emergency Department (HOSPITAL_COMMUNITY)

## 2023-11-18 ENCOUNTER — Emergency Department (HOSPITAL_COMMUNITY)
Admission: EM | Admit: 2023-11-18 | Discharge: 2023-11-18 | Disposition: A | Discharge status: Home / Self Care | Attending: Emergency Medicine | Admitting: Emergency Medicine

## 2023-11-18 ENCOUNTER — Encounter (HOSPITAL_COMMUNITY): Payer: Self-pay | Admitting: Emergency Medicine

## 2023-11-18 DIAGNOSIS — S0081XA Abrasion of other part of head, initial encounter: Secondary | ICD-10-CM | POA: Insufficient documentation

## 2023-11-18 DIAGNOSIS — I110 Hypertensive heart disease with heart failure: Secondary | ICD-10-CM | POA: Insufficient documentation

## 2023-11-18 DIAGNOSIS — Y92480 Sidewalk as the place of occurrence of the external cause: Secondary | ICD-10-CM | POA: Insufficient documentation

## 2023-11-18 DIAGNOSIS — T07XXXA Unspecified multiple injuries, initial encounter: Secondary | ICD-10-CM

## 2023-11-18 DIAGNOSIS — S3993XA Unspecified injury of pelvis, initial encounter: Secondary | ICD-10-CM | POA: Diagnosis not present

## 2023-11-18 DIAGNOSIS — S0990XA Unspecified injury of head, initial encounter: Secondary | ICD-10-CM | POA: Diagnosis present

## 2023-11-18 DIAGNOSIS — J9811 Atelectasis: Secondary | ICD-10-CM | POA: Diagnosis not present

## 2023-11-18 DIAGNOSIS — S6991XA Unspecified injury of right wrist, hand and finger(s), initial encounter: Secondary | ICD-10-CM | POA: Diagnosis not present

## 2023-11-18 DIAGNOSIS — S0993XA Unspecified injury of face, initial encounter: Secondary | ICD-10-CM | POA: Diagnosis not present

## 2023-11-18 DIAGNOSIS — I480 Paroxysmal atrial fibrillation: Secondary | ICD-10-CM | POA: Insufficient documentation

## 2023-11-18 DIAGNOSIS — Z79899 Other long term (current) drug therapy: Secondary | ICD-10-CM | POA: Insufficient documentation

## 2023-11-18 DIAGNOSIS — W19XXXA Unspecified fall, initial encounter: Secondary | ICD-10-CM | POA: Diagnosis not present

## 2023-11-18 DIAGNOSIS — Z96643 Presence of artificial hip joint, bilateral: Secondary | ICD-10-CM | POA: Diagnosis not present

## 2023-11-18 DIAGNOSIS — W01198A Fall on same level from slipping, tripping and stumbling with subsequent striking against other object, initial encounter: Secondary | ICD-10-CM | POA: Insufficient documentation

## 2023-11-18 DIAGNOSIS — S299XXA Unspecified injury of thorax, initial encounter: Secondary | ICD-10-CM | POA: Diagnosis not present

## 2023-11-18 DIAGNOSIS — R918 Other nonspecific abnormal finding of lung field: Secondary | ICD-10-CM | POA: Diagnosis not present

## 2023-11-18 DIAGNOSIS — I2510 Atherosclerotic heart disease of native coronary artery without angina pectoris: Secondary | ICD-10-CM | POA: Diagnosis not present

## 2023-11-18 DIAGNOSIS — Z7901 Long term (current) use of anticoagulants: Secondary | ICD-10-CM | POA: Diagnosis not present

## 2023-11-18 DIAGNOSIS — I10 Essential (primary) hypertension: Secondary | ICD-10-CM | POA: Diagnosis not present

## 2023-11-18 DIAGNOSIS — K029 Dental caries, unspecified: Secondary | ICD-10-CM | POA: Diagnosis not present

## 2023-11-18 DIAGNOSIS — I5030 Unspecified diastolic (congestive) heart failure: Secondary | ICD-10-CM | POA: Insufficient documentation

## 2023-11-18 DIAGNOSIS — M19041 Primary osteoarthritis, right hand: Secondary | ICD-10-CM | POA: Diagnosis not present

## 2023-11-18 DIAGNOSIS — S199XXA Unspecified injury of neck, initial encounter: Secondary | ICD-10-CM | POA: Diagnosis not present

## 2023-11-18 DIAGNOSIS — I672 Cerebral atherosclerosis: Secondary | ICD-10-CM | POA: Diagnosis not present

## 2023-11-18 DIAGNOSIS — J90 Pleural effusion, not elsewhere classified: Secondary | ICD-10-CM | POA: Diagnosis not present

## 2023-11-18 DIAGNOSIS — S60511A Abrasion of right hand, initial encounter: Secondary | ICD-10-CM | POA: Diagnosis not present

## 2023-11-18 DIAGNOSIS — R531 Weakness: Secondary | ICD-10-CM | POA: Diagnosis not present

## 2023-11-18 DIAGNOSIS — M47816 Spondylosis without myelopathy or radiculopathy, lumbar region: Secondary | ICD-10-CM | POA: Diagnosis not present

## 2023-11-18 LAB — COMPREHENSIVE METABOLIC PANEL WITH GFR
ALT: 13 U/L (ref 0–44)
AST: 20 U/L (ref 15–41)
Albumin: 3 g/dL — ABNORMAL LOW (ref 3.5–5.0)
Alkaline Phosphatase: 47 U/L (ref 38–126)
Anion gap: 8 (ref 5–15)
BUN: 12 mg/dL (ref 8–23)
CO2: 25 mmol/L (ref 22–32)
Calcium: 8.7 mg/dL — ABNORMAL LOW (ref 8.9–10.3)
Chloride: 106 mmol/L (ref 98–111)
Creatinine, Ser: 0.98 mg/dL (ref 0.61–1.24)
GFR, Estimated: >60 mL/min (ref >60)
Glucose, Bld: 97 mg/dL (ref 70–99)
Potassium: 3.9 mmol/L (ref 3.5–5.1)
Sodium: 139 mmol/L (ref 135–145)
Total Bilirubin: 0.6 mg/dL (ref 0.0–1.2)
Total Protein: 5.7 g/dL — ABNORMAL LOW (ref 6.5–8.1)

## 2023-11-18 LAB — I-STAT CHEM 8, ED
BUN: 12 mg/dL (ref 8–23)
Calcium, Ion: 1.19 mmol/L (ref 1.15–1.40)
Chloride: 102 mmol/L (ref 98–111)
Creatinine, Ser: 1 mg/dL (ref 0.61–1.24)
Glucose, Bld: 94 mg/dL (ref 70–99)
HCT: 30 % — ABNORMAL LOW (ref 39.0–52.0)
Hemoglobin: 10.2 g/dL — ABNORMAL LOW (ref 13.0–17.0)
Potassium: 3.9 mmol/L (ref 3.5–5.1)
Sodium: 140 mmol/L (ref 135–145)
TCO2: 25 mmol/L (ref 22–32)

## 2023-11-18 LAB — CBC
HCT: 31.7 % — ABNORMAL LOW (ref 39.0–52.0)
Hemoglobin: 9.2 g/dL — ABNORMAL LOW (ref 13.0–17.0)
MCH: 23.4 pg — ABNORMAL LOW (ref 26.0–34.0)
MCHC: 29 g/dL — ABNORMAL LOW (ref 30.0–36.0)
MCV: 80.7 fL (ref 80.0–100.0)
Platelets: 221 10*3/uL (ref 150–400)
RBC: 3.93 MIL/uL — ABNORMAL LOW (ref 4.22–5.81)
RDW: 22.4 % — ABNORMAL HIGH (ref 11.5–15.5)
WBC: 5.4 10*3/uL (ref 4.0–10.5)
nRBC: 0 % (ref 0.0–0.2)

## 2023-11-18 LAB — ETHANOL: Alcohol, Ethyl (B): <15 mg/dL (ref <15)

## 2023-11-18 LAB — I-STAT CG4 LACTIC ACID, ED: Lactic Acid, Venous: 1.1 mmol/L (ref 0.5–1.9)

## 2023-11-18 LAB — PROTIME-INR
INR: 1.4 — ABNORMAL HIGH (ref 0.8–1.2)
Prothrombin Time: 17.5 s — ABNORMAL HIGH (ref 11.4–15.2)

## 2023-11-18 MED ORDER — ACETAMINOPHEN 500 MG PO TABS
1000.0000 mg | ORAL_TABLET | Freq: Once | ORAL | Status: AC
  Administered 2023-11-18: 1000 mg via ORAL
  Filled 2023-11-18: qty 2

## 2023-11-18 NOTE — Progress Notes (Signed)
 Orthopedic Tech Progress Note Patient Details:  BELA SOKOLOFF [birth year] [id number]  Level 2 trauma   Patient ID: Lolly Riser, male   DOB: 1940-11-30, 83 y.o.   MRN: [id number]  Kermitt Pedlar 11/18/2023, 8:03 PM

## 2023-11-18 NOTE — ED Notes (Signed)
 Portable xrays completed.

## 2023-11-18 NOTE — ED Notes (Signed)
 Trauma Response Nurse Documentation   [name] is a 83 y.o. male arriving to [facility] ED via EMS  On Eliquis  (apixaban ) daily. Trauma was activated as a Level 2 by ED charge RN based on the following trauma criteria Elderly patients > 65 with head trauma on anti-coagulation (excluding ASA).  Patient cleared for CT by Dr. Leida Puna. Pt transported to CT with trauma response nurse present to monitor. RN remained with the patient throughout their absence from the department for clinical observation.   GCS 15.   History   Past Medical History:  Diagnosis Date   Ascending aorta dilatation (HCC)    44mm by chest CT angio 04/2018   CAD (coronary artery disease)    a. 05/2006 Abnl stress test w/ 2-69mm ST dep; b. 05/2006 Cath/PCI: LM nl, LAD 30-40ost, 20-30p, 90d (small), D1 nl, D2 nl, LCX nl, OM1/2 nl, RCA Ca2+, 45m/d (3.5x16 Liberty BMS & 3.5x12 Liberty BMS), EF 50%.   Chronic diastolic CHF (congestive heart failure) (HCC)    a. 10/2017 Echo: EF 50-55%, mild AI, sev dil LA, mod dil RA.   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 CHA2DS2VASc 5-->Eliquis ; b. Prev WCT on flecainide;  c. 12/2017 s/p DCCV.   Pneumothorax    Prolonged Q-T interval on ECG 06/22/2022   Ventricular tachycardia (HCC)    a. possibly proarrhythmia from flecainide.     Past Surgical History:  Procedure Laterality Date   CARDIOVERSION N/A 12/23/2017   Procedure: CARDIOVERSION;  Surgeon: Jacqueline Matsu, MD;  Location: [facility] ENDOSCOPY;  Service: Cardiovascular;  Laterality: N/A;   CARDIOVERSION N/A 03/08/2018   Procedure: CARDIOVERSION;  Surgeon: Jacqueline Matsu, MD;  Location: [facility] ENDOSCOPY;  Service: Cardiovascular;  Laterality: N/A;   INGUINAL HERNIA REPAIR     x2   left total hip surgery     TEE WITHOUT CARDIOVERSION N/A 03/08/2018   Procedure: TRANSESOPHAGEAL ECHOCARDIOGRAM (TEE);  Surgeon: Jacqueline Matsu, MD;  Location: [facility] ENDOSCOPY;  Service: Cardiovascular;  Laterality: N/A;   TOTAL HIP ARTHROPLASTY Right 08/19/2020   Procedure: TOTAL HIP ARTHROPLASTY ANTERIOR APPROACH;  Surgeon: Liliane Rei, MD;  Location: [facility] ORS;  Service: Orthopedics;  Laterality: Right;    TOTAL KNEE ARTHROPLASTY Left 02/28/2018   Procedure: LEFT TOTAL KNEE ARTHROPLASTY;  Surgeon: Liliane Rei, MD;  Location: [facility] ORS;  Service: Orthopedics;  Laterality: Left;   VEIN LIGATION AND STRIPPING Left 04/08/2020   Procedure: LEFT LOWER EXTREMITY VEIN LIGATION AND EXCISION OF ULCER;  Surgeon: Dannis Dy, MD;  Location: [facility] OR;  Service: Vascular;  Laterality: Left;       Initial Focused Assessment (If applicable, or please see trauma documentation): Alert/oriented male with skin tears to right forehead, cheek, and right hand, bruising to right thumb with decreased mobility. Bleeding controlled with EMS dressing  Airway patent, BS clear Bleeding from skin tears controlled GCS 15 [id number]  CT's Completed:   CT Head, CT Maxillofacial, and CT C-Spine   Interventions:  IV start and trauma lab draw Portable chest, pelvis and right hand XRAY CT head, max face and c-spine TDAP deferred, UTD fall 2024 Wound care, dressings Family presence/updates  Plan for disposition:  Discharge home   Consults completed:  none   Event Summary: Presents via EMS from home after a fall onto pavement while taking out the trash. Alert x4. Takes eliquis . Multiple skin tears to right  hand and right side of his face, bleeding controlled. Report TDAP is up to date last fall. Trauma scans unremarkable. D/C home with family.  MTP Summary (If applicable): NA  Bedside handoff with ED RN [name].    [name]  Trauma Response RN  Please call TRN at [phone] for further assistance.

## 2023-11-18 NOTE — ED Triage Notes (Signed)
 Patient BIB EMS for evaluation after falling while taking his trash out to road.  Takes Eliquis .  No reports of LOC.  Fall was unwitnessed.  Has laceration to forehead and R hand.   Bleeding controlled at this time.  No reports of dizziness.

## 2023-11-18 NOTE — Progress Notes (Signed)
 Chaplain responded to a Trauma Level 2 page for Pt. Chaplain contacted family and later was able to see them in the room, with Pt. No further support is needed at this time.  Althea Jesus Chaplain Resident   11/18/23 2051  Spiritual Encounters  Type of Visit Initial  Care provided to: Pt and family  Referral source Trauma page  Reason for visit Trauma  OnCall Visit Yes

## 2023-11-18 NOTE — ED Provider Notes (Signed)
 [place] EMERGENCY DEPARTMENT AT [facility] Provider Note   CSN: [id number] Arrival date & time: 11/18/23  1958     History  Chief Complaint  Patient presents with   Fall   Trauma    [name] is a 83 y.o. male.  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  Patient reports he was walking out with his trash can to put it by the side of the road when his hand got tangled in the lid and he tripped falling forward hitting his right knee and face on the pavement.  He reports his hand got stuck in the trash can.  He had no loss of consciousness.  He denies significant headache.  His only complaint is of right hand pain.  EMS report patient's blood pressure was normal and he was mentating normally onsite.  His neighbor saw all this occur and called 911 immediately.  His daughter is now present at bedside and reports he is at his baseline.  He only reports some minimal soreness in his knee.  No other specific complaints including no chest pain, shortness of breath or abdominal pain.  He also denies neck pain.  Tetanus shot is up-to-date.  The history is provided by the patient, the EMS personnel and a relative.  Fall  Trauma Mechanism of injury: Fall       Home Medications Prior to Admission medications   Medication Sig Start Date End Date Taking? Authorizing Provider  acetaminophen  (TYLENOL ) 325 MG tablet Take 2 tablets (650 mg total) by mouth every 6 (six) hours as needed for moderate pain. 03/17/22   Adolph Hoop, PA-C  apixaban  (ELIQUIS ) 2.5 MG TABS tablet Take 1 tablet (2.5 mg total) by mouth 2 (two) times daily. 06/04/23   Boyce Byes, MD  atorvastatin  (LIPITOR) 20 MG tablet TAKE 1 TABLET(20 MG)  BY MOUTH DAILY 06/04/23   Jacqueline Matsu, MD  Boswellia Serrata (BOSWELLIA PO) Take 1,200 mg by mouth daily.    [provider]  carvedilol  (COREG ) 6.25 MG tablet Take 1 tablet (6.25 mg total) by mouth 2 (two) times daily. 10/20/23   Boyce Byes, MD  furosemide  (LASIX ) 20 MG tablet Take 40 mg by mouth 2 (two) times daily.    [provider]  lidocaine  (LIDODERM ) 5 % Place 1 patch onto the skin daily.    [provider]  Misc Natural Products (GLUCOSAMINE CHONDROITIN TRIPLE) TABS Take 1 tablet by mouth daily.    [provider]  Multiple Vitamin (MULTIVITAMIN WITH MINERALS) TABS tablet Take 1 tablet by mouth daily.    [provider]  Omega-3 Fatty Acids (FISH OIL) 1200 MG CAPS Take 1,200 mg by mouth daily.    [provider]      Allergies    Patient has no known allergies.    Review of Systems   Review of Systems  Physical Exam Updated Vital Signs BP (!) 163/107   Pulse 84   Temp 98.4 F (36.9 C) (Oral)   Resp 17   Ht 6\' 2"  (1.88 m)   Wt 68 kg   SpO2 100%   BMI 19.26 kg/m  Physical Exam Vitals and nursing note reviewed.  Constitutional:  General: He is not in acute distress.    Appearance: He is well-developed.  HENT:     Head: Normocephalic.      Comments: Superficial abrasion over the right forehead and cheek Eyes:     Conjunctiva/sclera: Conjunctivae normal.     Pupils: Pupils are equal, round, and reactive to light.  Cardiovascular:     Rate and Rhythm: Normal rate and regular rhythm.     Heart sounds: No murmur heard. Pulmonary:     Effort: Pulmonary effort is normal. No respiratory distress.     Breath sounds: Normal breath sounds. No wheezing or rales.  Abdominal:     General: There is no distension.     Palpations: Abdomen is soft.     Tenderness: There is no abdominal tenderness. There is no guarding or rebound.  Musculoskeletal:        General: Tenderness present. Normal range of motion.      Right wrist: Normal.     Left wrist: Normal.       Hands:     Cervical back: Normal range of motion and neck supple. No tenderness.     Comments: Multiple abrasions noted over the dorsal surface of the hand and fingers.  Pain at the first MCP joint with some mild decreased mobility and findings consistent with chronic deformity and arthritis.  Prior bilateral knee replacements.  Superficial abrasion to the right knee.  Skin:    General: Skin is warm and dry.     Findings: No erythema or rash.  Neurological:     Mental Status: He is alert and oriented to person, place, and time.  Psychiatric:        Behavior: Behavior normal.     ED Results / Procedures / Treatments   Labs (all labs ordered are listed, but only abnormal results are displayed) Labs Reviewed  COMPREHENSIVE METABOLIC PANEL WITH GFR - Abnormal; Notable for the following components:      Result Value   Calcium  8.7 (*)    Total Protein 5.7 (*)    Albumin 3.0 (*)    All other components within normal limits  CBC - Abnormal; Notable for the following components:   RBC 3.93 (*)    Hemoglobin 9.2 (*)    HCT 31.7 (*)    MCH 23.4 (*)    MCHC 29.0 (*)    RDW 22.4 (*)    All other components within normal limits  PROTIME-INR - Abnormal; Notable for the following components:   Prothrombin Time 17.5 (*)    INR 1.4 (*)    All other components within normal limits  I-STAT CHEM 8, ED - Abnormal; Notable for the following components:   Hemoglobin 10.2 (*)    HCT 30.0 (*)    All other components within normal limits  ETHANOL  URINALYSIS, ROUTINE W REFLEX MICROSCOPIC  I-STAT CG4 LACTIC ACID, ED    EKG EKG Interpretation Date/Time:  Thursday Nov 18 2023 20:21:19 EDT Ventricular Rate:  85 PR Interval:    QRS Duration:  90 QT Interval:  432 QTC Calculation: 514 R Axis:   83  Text Interpretation: Atrial fibrillation Anteroseptal infarct, age indeterminate Prolonged QT interval Baseline wander in lead(s) V6 No  significant change since last tracing Confirmed by Almond Army ([id number]) on 11/18/2023 8:54:44 PM  Radiology CT Head Wo Contrast Result Date: 11/18/2023 CLINICAL DATA:  Facial trauma, blunt EXAM: CT HEAD WITHOUT CONTRAST CT MAXILLOFACIAL WITHOUT CONTRAST CT CERVICAL SPINE WITHOUT CONTRAST TECHNIQUE: Multidetector CT imaging  of the head, cervical spine, and maxillofacial structures were performed using the standard protocol without intravenous contrast. Multiplanar CT image reconstructions of the cervical spine and maxillofacial structures were also generated. RADIATION DOSE REDUCTION: This exam was performed according to the departmental dose-optimization program which includes automated exposure control, adjustment of the mA and/or kV according to patient size and/or use of iterative reconstruction technique. COMPARISON:  MRI head 03/01/2023, CT head 02/28/2023 FINDINGS: CT HEAD FINDINGS Brain: Patchy and confluent areas of decreased attenuation are noted throughout the deep and periventricular white matter of the cerebral hemispheres bilaterally, compatible with chronic microvascular ischemic disease. No evidence of large-territorial acute infarction. No parenchymal hemorrhage. No mass lesion. No extra-axial collection. No mass effect or midline shift. No hydrocephalus. Basilar cisterns are patent. Vascular: No hyperdense vessel. Atherosclerotic calcifications are present within the cavernous internal carotid and vertebral arteries. Skull: No acute fracture or focal lesion. Other: None. CT MAXILLOFACIAL FINDINGS Osseous: No fracture or mandibular dislocation. No destructive process. Caries noted. Sinuses/Orbits: Right maxillary sinus mucosal thickening. Otherwise paranasal sinuses and mastoid air cells are clear. The orbits are unremarkable. Soft tissues: Negative. CT CERVICAL SPINE FINDINGS Alignment: Reversal of normal cervical lordosis centered at the C4 level with associated grade 1 anterolisthesis of C3  on C4. Mild retrolisthesis of C6 on C7. Skull base and vertebrae: Multilevel moderate severe degenerative changes spine with associated right C3-C4 and C6-C7 severe osseous neural foraminal stenosis. No severe osseous central canal stenosis. No acute fracture. No aggressive appearing focal osseous lesion or focal pathologic process. Soft tissues and spinal canal: No prevertebral fluid or swelling. No visible canal hematoma. Upper chest: Bilateral pleural calcifications. Other: None. IMPRESSION: 1. No acute intracranial abnormality. 2.  No acute displaced facial fracture. 3. No acute displaced fracture or traumatic listhesis of the cervical spine. Electronically Signed   By: Morgane  Naveau M.D.   On: 11/18/2023 20:56   CT Maxillofacial Wo Contrast Result Date: 11/18/2023 CLINICAL DATA:  Facial trauma, blunt EXAM: CT HEAD WITHOUT CONTRAST CT MAXILLOFACIAL WITHOUT CONTRAST CT CERVICAL SPINE WITHOUT CONTRAST TECHNIQUE: Multidetector CT imaging of the head, cervical spine, and maxillofacial structures were performed using the standard protocol without intravenous contrast. Multiplanar CT image reconstructions of the cervical spine and maxillofacial structures were also generated. RADIATION DOSE REDUCTION: This exam was performed according to the departmental dose-optimization program which includes automated exposure control, adjustment of the mA and/or kV according to patient size and/or use of iterative reconstruction technique. COMPARISON:  MRI head 03/01/2023, CT head 02/28/2023 FINDINGS: CT HEAD FINDINGS Brain: Patchy and confluent areas of decreased attenuation are noted throughout the deep and periventricular white matter of the cerebral hemispheres bilaterally, compatible with chronic microvascular ischemic disease. No evidence of large-territorial acute infarction. No parenchymal hemorrhage. No mass lesion. No extra-axial collection. No mass effect or midline shift. No hydrocephalus. Basilar cisterns are  patent. Vascular: No hyperdense vessel. Atherosclerotic calcifications are present within the cavernous internal carotid and vertebral arteries. Skull: No acute fracture or focal lesion. Other: None. CT MAXILLOFACIAL FINDINGS Osseous: No fracture or mandibular dislocation. No destructive process. Caries noted. Sinuses/Orbits: Right maxillary sinus mucosal thickening. Otherwise paranasal sinuses and mastoid air cells are clear. The orbits are unremarkable. Soft tissues: Negative. CT CERVICAL SPINE FINDINGS Alignment: Reversal of normal cervical lordosis centered at the C4 level with associated grade 1 anterolisthesis of C3 on C4. Mild retrolisthesis of C6 on C7. Skull base and vertebrae: Multilevel moderate severe degenerative changes spine with associated right C3-C4 and C6-C7 severe osseous neural  foraminal stenosis. No severe osseous central canal stenosis. No acute fracture. No aggressive appearing focal osseous lesion or focal pathologic process. Soft tissues and spinal canal: No prevertebral fluid or swelling. No visible canal hematoma. Upper chest: Bilateral pleural calcifications. Other: None. IMPRESSION: 1. No acute intracranial abnormality. 2.  No acute displaced facial fracture. 3. No acute displaced fracture or traumatic listhesis of the cervical spine. Electronically Signed   By: Morgane  Naveau M.D.   On: 11/18/2023 20:56   CT Cervical Spine Wo Contrast Result Date: 11/18/2023 CLINICAL DATA:  Facial trauma, blunt EXAM: CT HEAD WITHOUT CONTRAST CT MAXILLOFACIAL WITHOUT CONTRAST CT CERVICAL SPINE WITHOUT CONTRAST TECHNIQUE: Multidetector CT imaging of the head, cervical spine, and maxillofacial structures were performed using the standard protocol without intravenous contrast. Multiplanar CT image reconstructions of the cervical spine and maxillofacial structures were also generated. RADIATION DOSE REDUCTION: This exam was performed according to the departmental dose-optimization program which includes  automated exposure control, adjustment of the mA and/or kV according to patient size and/or use of iterative reconstruction technique. COMPARISON:  MRI head 03/01/2023, CT head 02/28/2023 FINDINGS: CT HEAD FINDINGS Brain: Patchy and confluent areas of decreased attenuation are noted throughout the deep and periventricular white matter of the cerebral hemispheres bilaterally, compatible with chronic microvascular ischemic disease. No evidence of large-territorial acute infarction. No parenchymal hemorrhage. No mass lesion. No extra-axial collection. No mass effect or midline shift. No hydrocephalus. Basilar cisterns are patent. Vascular: No hyperdense vessel. Atherosclerotic calcifications are present within the cavernous internal carotid and vertebral arteries. Skull: No acute fracture or focal lesion. Other: None. CT MAXILLOFACIAL FINDINGS Osseous: No fracture or mandibular dislocation. No destructive process. Caries noted. Sinuses/Orbits: Right maxillary sinus mucosal thickening. Otherwise paranasal sinuses and mastoid air cells are clear. The orbits are unremarkable. Soft tissues: Negative. CT CERVICAL SPINE FINDINGS Alignment: Reversal of normal cervical lordosis centered at the C4 level with associated grade 1 anterolisthesis of C3 on C4. Mild retrolisthesis of C6 on C7. Skull base and vertebrae: Multilevel moderate severe degenerative changes spine with associated right C3-C4 and C6-C7 severe osseous neural foraminal stenosis. No severe osseous central canal stenosis. No acute fracture. No aggressive appearing focal osseous lesion or focal pathologic process. Soft tissues and spinal canal: No prevertebral fluid or swelling. No visible canal hematoma. Upper chest: Bilateral pleural calcifications. Other: None. IMPRESSION: 1. No acute intracranial abnormality. 2.  No acute displaced facial fracture. 3. No acute displaced fracture or traumatic listhesis of the cervical spine. Electronically Signed   By: Morgane   Naveau M.D.   On: 11/18/2023 20:56   DG Chest Portable 1 View Result Date: 11/18/2023 CLINICAL DATA:  Trauma.  Level 2 trauma.  Fell on thinners. EXAM: PORTABLE CHEST 1 VIEW COMPARISON:  03/25/2023 FINDINGS: Cardiac enlargement. Small bilateral pleural effusions with basilar atelectasis. Emphysematous changes in the lungs. No focal consolidation. No pneumothorax. Mediastinal contours appear intact. Thoracic scoliosis convex towards the right. Degenerative changes in the thoracic spine and shoulders. Calcification of the aorta. IMPRESSION: 1. Cardiac enlargement with small bilateral pleural effusions and basilar atelectasis. 2. Emphysematous changes in the lungs.  No focal consolidation. Electronically Signed   By: Boyce Byes M.D.   On: 11/18/2023 20:26   DG Pelvis Portable Result Date: 11/18/2023 CLINICAL DATA:  Trauma.  Level 2 trauma.  Fell on thinners. EXAM: PORTABLE PELVIS 1-2 VIEWS COMPARISON:  08/19/2020 FINDINGS: Bilateral total hip arthroplasties. No evidence of acute fracture or dislocation. SI joints and symphysis pubis are not displaced. Degenerative changes in the  lower lumbar spine. Vascular calcifications. IMPRESSION: Bilateral total hip arthroplasties.  No acute bony abnormalities. Electronically Signed   By: Boyce Byes M.D.   On: 11/18/2023 20:25   DG Hand Complete Right Result Date: 11/18/2023 CLINICAL DATA:  Trauma.  Level 2 trauma.  Fall on thinners. EXAM: RIGHT HAND - COMPLETE 3+ VIEW COMPARISON:  01/10/2021 FINDINGS: Prominent degenerative changes in the interphalangeal joints, first carpometacarpal joint, STT joints, and radiocarpal joints. Associated remodeling of the distal radial surface. Calcification in the scapholunate ligament and triangular fibrocartilage. No evidence of acute fracture or dislocation. No focal bone lesions. Vascular calcifications in the soft tissues. IMPRESSION: Degenerative changes throughout the right hand and wrist with associated bone remodeling  and ligamentous calcifications. No acute displaced fractures are identified. Electronically Signed   By: Boyce Byes M.D.   On: 11/18/2023 20:24    Procedures Procedures    Medications Ordered in ED Medications  acetaminophen  (TYLENOL ) tablet 1,000 mg (has no administration in time range)    ED Course/ Medical Decision Making/ A&P                                 Medical Decision Making Amount and/or Complexity of Data Reviewed External Data Reviewed: notes. Labs: ordered. Decision-making details documented in ED Course. Radiology: ordered and independent interpretation performed. Decision-making details documented in ED Course. ECG/medicine tests: ordered and independent interpretation performed. Decision-making details documented in ED Course.   Pt with multiple medical problems and comorbidities and presenting today with a complaint that caries a high risk for morbidity and mortality.  Here today after a fall and head injury.  Patient is awake alert and mentating normally.  He denies any prodromal symptoms prior to the fall and seems that he got tangled up with the trash cans. I dependently interpreted patient's labs and EKG.  EKG with atrial fibrillation which is unchanged from prior, Chem-8 without acute findings, CBC with a hemoglobin of 9.2 which is at patient's baseline and normal platelet and white blood cell count.  INR is mildly elevated at 1.4.  I have independently visualized and interpreted pt's images today.  Chest x-ray without findings of pneumothorax or fracture, radiology reports small bilateral pleural effusions, hand image with degenerative changes but no obvious fracture which radiology also agrees.  Portable pelvis is negative for acute fracture.  Replacements appear intact         Final Clinical Impression(s) / ED Diagnoses Final diagnoses:  Fall, initial encounter  Abrasions of multiple sites    Rx / DC Orders ED Discharge Orders     None          Almond Army, MD 11/18/23 2136

## 2023-11-18 NOTE — Discharge Instructions (Signed)
 All the scans were normal.  You will  need to change dressings on your face daily.  You will be very sore the next few days so take extra strength tylenol .

## 2023-11-22 ENCOUNTER — Ambulatory Visit: Admitting: Cardiology

## 2023-11-22 DIAGNOSIS — W19XXXD Unspecified fall, subsequent encounter: Secondary | ICD-10-CM | POA: Diagnosis not present

## 2023-11-22 DIAGNOSIS — M545 Low back pain, unspecified: Secondary | ICD-10-CM | POA: Diagnosis not present

## 2023-11-22 DIAGNOSIS — M47817 Spondylosis without myelopathy or radiculopathy, lumbosacral region: Secondary | ICD-10-CM | POA: Diagnosis not present

## 2023-11-22 DIAGNOSIS — I2510 Atherosclerotic heart disease of native coronary artery without angina pectoris: Secondary | ICD-10-CM | POA: Diagnosis not present

## 2023-11-22 DIAGNOSIS — R54 Age-related physical debility: Secondary | ICD-10-CM | POA: Diagnosis not present

## 2023-11-22 DIAGNOSIS — I872 Venous insufficiency (chronic) (peripheral): Secondary | ICD-10-CM | POA: Diagnosis not present

## 2023-11-22 DIAGNOSIS — L97212 Non-pressure chronic ulcer of right calf with fat layer exposed: Secondary | ICD-10-CM | POA: Diagnosis not present

## 2023-11-22 DIAGNOSIS — I5032 Chronic diastolic (congestive) heart failure: Secondary | ICD-10-CM | POA: Diagnosis not present

## 2023-11-22 DIAGNOSIS — L0390 Cellulitis, unspecified: Secondary | ICD-10-CM | POA: Diagnosis not present

## 2023-11-23 DIAGNOSIS — I4891 Unspecified atrial fibrillation: Secondary | ICD-10-CM | POA: Diagnosis not present

## 2023-11-23 DIAGNOSIS — I10 Essential (primary) hypertension: Secondary | ICD-10-CM | POA: Diagnosis not present

## 2023-11-23 DIAGNOSIS — L989 Disorder of the skin and subcutaneous tissue, unspecified: Secondary | ICD-10-CM | POA: Diagnosis not present

## 2023-11-23 DIAGNOSIS — I2510 Atherosclerotic heart disease of native coronary artery without angina pectoris: Secondary | ICD-10-CM | POA: Diagnosis not present

## 2023-11-23 DIAGNOSIS — I739 Peripheral vascular disease, unspecified: Secondary | ICD-10-CM | POA: Diagnosis not present

## 2023-11-23 DIAGNOSIS — L97812 Non-pressure chronic ulcer of other part of right lower leg with fat layer exposed: Secondary | ICD-10-CM | POA: Diagnosis not present

## 2023-11-23 DIAGNOSIS — I872 Venous insufficiency (chronic) (peripheral): Secondary | ICD-10-CM | POA: Diagnosis not present

## 2023-11-23 DIAGNOSIS — T07XXXA Unspecified multiple injuries, initial encounter: Secondary | ICD-10-CM | POA: Diagnosis not present

## 2023-11-23 DIAGNOSIS — I87311 Chronic venous hypertension (idiopathic) with ulcer of right lower extremity: Secondary | ICD-10-CM | POA: Diagnosis not present

## 2023-11-23 DIAGNOSIS — S61401A Unspecified open wound of right hand, initial encounter: Secondary | ICD-10-CM | POA: Diagnosis not present

## 2023-11-26 DIAGNOSIS — I110 Hypertensive heart disease with heart failure: Secondary | ICD-10-CM | POA: Diagnosis not present

## 2023-11-26 DIAGNOSIS — I2510 Atherosclerotic heart disease of native coronary artery without angina pectoris: Secondary | ICD-10-CM | POA: Diagnosis not present

## 2023-11-26 DIAGNOSIS — I5032 Chronic diastolic (congestive) heart failure: Secondary | ICD-10-CM | POA: Diagnosis not present

## 2023-11-26 DIAGNOSIS — I480 Paroxysmal atrial fibrillation: Secondary | ICD-10-CM | POA: Diagnosis not present

## 2023-11-26 DIAGNOSIS — I77810 Thoracic aortic ectasia: Secondary | ICD-10-CM | POA: Diagnosis not present

## 2023-11-26 DIAGNOSIS — I472 Ventricular tachycardia, unspecified: Secondary | ICD-10-CM | POA: Diagnosis not present

## 2023-11-26 DIAGNOSIS — L97212 Non-pressure chronic ulcer of right calf with fat layer exposed: Secondary | ICD-10-CM | POA: Diagnosis not present

## 2023-11-26 DIAGNOSIS — M797 Fibromyalgia: Secondary | ICD-10-CM | POA: Diagnosis not present

## 2023-11-26 DIAGNOSIS — I872 Venous insufficiency (chronic) (peripheral): Secondary | ICD-10-CM | POA: Diagnosis not present

## 2023-12-02 DIAGNOSIS — L97212 Non-pressure chronic ulcer of right calf with fat layer exposed: Secondary | ICD-10-CM | POA: Diagnosis not present

## 2023-12-02 DIAGNOSIS — I77810 Thoracic aortic ectasia: Secondary | ICD-10-CM | POA: Diagnosis not present

## 2023-12-02 DIAGNOSIS — I872 Venous insufficiency (chronic) (peripheral): Secondary | ICD-10-CM | POA: Diagnosis not present

## 2023-12-02 DIAGNOSIS — I472 Ventricular tachycardia, unspecified: Secondary | ICD-10-CM | POA: Diagnosis not present

## 2023-12-02 DIAGNOSIS — I480 Paroxysmal atrial fibrillation: Secondary | ICD-10-CM | POA: Diagnosis not present

## 2023-12-02 DIAGNOSIS — I110 Hypertensive heart disease with heart failure: Secondary | ICD-10-CM | POA: Diagnosis not present

## 2023-12-02 DIAGNOSIS — I2510 Atherosclerotic heart disease of native coronary artery without angina pectoris: Secondary | ICD-10-CM | POA: Diagnosis not present

## 2023-12-02 DIAGNOSIS — I5032 Chronic diastolic (congestive) heart failure: Secondary | ICD-10-CM | POA: Diagnosis not present

## 2023-12-02 DIAGNOSIS — M797 Fibromyalgia: Secondary | ICD-10-CM | POA: Diagnosis not present

## 2023-12-09 DIAGNOSIS — L97212 Non-pressure chronic ulcer of right calf with fat layer exposed: Secondary | ICD-10-CM | POA: Diagnosis not present

## 2023-12-09 DIAGNOSIS — I77810 Thoracic aortic ectasia: Secondary | ICD-10-CM | POA: Diagnosis not present

## 2023-12-09 DIAGNOSIS — I480 Paroxysmal atrial fibrillation: Secondary | ICD-10-CM | POA: Diagnosis not present

## 2023-12-09 DIAGNOSIS — I2510 Atherosclerotic heart disease of native coronary artery without angina pectoris: Secondary | ICD-10-CM | POA: Diagnosis not present

## 2023-12-09 DIAGNOSIS — I872 Venous insufficiency (chronic) (peripheral): Secondary | ICD-10-CM | POA: Diagnosis not present

## 2023-12-09 DIAGNOSIS — I472 Ventricular tachycardia, unspecified: Secondary | ICD-10-CM | POA: Diagnosis not present

## 2023-12-09 DIAGNOSIS — I5032 Chronic diastolic (congestive) heart failure: Secondary | ICD-10-CM | POA: Diagnosis not present

## 2023-12-09 DIAGNOSIS — M797 Fibromyalgia: Secondary | ICD-10-CM | POA: Diagnosis not present

## 2023-12-09 DIAGNOSIS — I110 Hypertensive heart disease with heart failure: Secondary | ICD-10-CM | POA: Diagnosis not present

## 2023-12-14 DIAGNOSIS — I5032 Chronic diastolic (congestive) heart failure: Secondary | ICD-10-CM | POA: Diagnosis not present

## 2023-12-14 DIAGNOSIS — M797 Fibromyalgia: Secondary | ICD-10-CM | POA: Diagnosis not present

## 2023-12-14 DIAGNOSIS — I480 Paroxysmal atrial fibrillation: Secondary | ICD-10-CM | POA: Diagnosis not present

## 2023-12-14 DIAGNOSIS — L97212 Non-pressure chronic ulcer of right calf with fat layer exposed: Secondary | ICD-10-CM | POA: Diagnosis not present

## 2023-12-14 DIAGNOSIS — I77810 Thoracic aortic ectasia: Secondary | ICD-10-CM | POA: Diagnosis not present

## 2023-12-14 DIAGNOSIS — I110 Hypertensive heart disease with heart failure: Secondary | ICD-10-CM | POA: Diagnosis not present

## 2023-12-14 DIAGNOSIS — I872 Venous insufficiency (chronic) (peripheral): Secondary | ICD-10-CM | POA: Diagnosis not present

## 2023-12-14 DIAGNOSIS — I2510 Atherosclerotic heart disease of native coronary artery without angina pectoris: Secondary | ICD-10-CM | POA: Diagnosis not present

## 2023-12-14 DIAGNOSIS — I472 Ventricular tachycardia, unspecified: Secondary | ICD-10-CM | POA: Diagnosis not present

## 2023-12-15 ENCOUNTER — Ambulatory Visit: Admitting: Cardiology

## 2023-12-15 DIAGNOSIS — L97212 Non-pressure chronic ulcer of right calf with fat layer exposed: Secondary | ICD-10-CM | POA: Diagnosis not present

## 2023-12-15 DIAGNOSIS — I77810 Thoracic aortic ectasia: Secondary | ICD-10-CM | POA: Diagnosis not present

## 2023-12-15 DIAGNOSIS — I110 Hypertensive heart disease with heart failure: Secondary | ICD-10-CM | POA: Diagnosis not present

## 2023-12-15 DIAGNOSIS — I472 Ventricular tachycardia, unspecified: Secondary | ICD-10-CM | POA: Diagnosis not present

## 2023-12-15 DIAGNOSIS — I5032 Chronic diastolic (congestive) heart failure: Secondary | ICD-10-CM | POA: Diagnosis not present

## 2023-12-15 DIAGNOSIS — I480 Paroxysmal atrial fibrillation: Secondary | ICD-10-CM | POA: Diagnosis not present

## 2023-12-15 DIAGNOSIS — I872 Venous insufficiency (chronic) (peripheral): Secondary | ICD-10-CM | POA: Diagnosis not present

## 2023-12-15 DIAGNOSIS — M797 Fibromyalgia: Secondary | ICD-10-CM | POA: Diagnosis not present

## 2023-12-15 DIAGNOSIS — I2510 Atherosclerotic heart disease of native coronary artery without angina pectoris: Secondary | ICD-10-CM | POA: Diagnosis not present

## 2023-12-17 DIAGNOSIS — I480 Paroxysmal atrial fibrillation: Secondary | ICD-10-CM | POA: Diagnosis not present

## 2023-12-17 DIAGNOSIS — I77810 Thoracic aortic ectasia: Secondary | ICD-10-CM | POA: Diagnosis not present

## 2023-12-17 DIAGNOSIS — I872 Venous insufficiency (chronic) (peripheral): Secondary | ICD-10-CM | POA: Diagnosis not present

## 2023-12-17 DIAGNOSIS — M797 Fibromyalgia: Secondary | ICD-10-CM | POA: Diagnosis not present

## 2023-12-17 DIAGNOSIS — I5032 Chronic diastolic (congestive) heart failure: Secondary | ICD-10-CM | POA: Diagnosis not present

## 2023-12-17 DIAGNOSIS — I110 Hypertensive heart disease with heart failure: Secondary | ICD-10-CM | POA: Diagnosis not present

## 2023-12-17 DIAGNOSIS — I472 Ventricular tachycardia, unspecified: Secondary | ICD-10-CM | POA: Diagnosis not present

## 2023-12-17 DIAGNOSIS — I2510 Atherosclerotic heart disease of native coronary artery without angina pectoris: Secondary | ICD-10-CM | POA: Diagnosis not present

## 2023-12-17 DIAGNOSIS — L97212 Non-pressure chronic ulcer of right calf with fat layer exposed: Secondary | ICD-10-CM | POA: Diagnosis not present

## 2023-12-23 DIAGNOSIS — I480 Paroxysmal atrial fibrillation: Secondary | ICD-10-CM | POA: Diagnosis not present

## 2023-12-23 DIAGNOSIS — I2510 Atherosclerotic heart disease of native coronary artery without angina pectoris: Secondary | ICD-10-CM | POA: Diagnosis not present

## 2023-12-23 DIAGNOSIS — I110 Hypertensive heart disease with heart failure: Secondary | ICD-10-CM | POA: Diagnosis not present

## 2023-12-23 DIAGNOSIS — I5032 Chronic diastolic (congestive) heart failure: Secondary | ICD-10-CM | POA: Diagnosis not present

## 2023-12-23 DIAGNOSIS — I77810 Thoracic aortic ectasia: Secondary | ICD-10-CM | POA: Diagnosis not present

## 2023-12-23 DIAGNOSIS — L97212 Non-pressure chronic ulcer of right calf with fat layer exposed: Secondary | ICD-10-CM | POA: Diagnosis not present

## 2023-12-23 DIAGNOSIS — M797 Fibromyalgia: Secondary | ICD-10-CM | POA: Diagnosis not present

## 2023-12-23 DIAGNOSIS — I872 Venous insufficiency (chronic) (peripheral): Secondary | ICD-10-CM | POA: Diagnosis not present

## 2023-12-23 DIAGNOSIS — I472 Ventricular tachycardia, unspecified: Secondary | ICD-10-CM | POA: Diagnosis not present

## 2023-12-24 DIAGNOSIS — I110 Hypertensive heart disease with heart failure: Secondary | ICD-10-CM | POA: Diagnosis not present

## 2023-12-24 DIAGNOSIS — M797 Fibromyalgia: Secondary | ICD-10-CM | POA: Diagnosis not present

## 2023-12-24 DIAGNOSIS — I2510 Atherosclerotic heart disease of native coronary artery without angina pectoris: Secondary | ICD-10-CM | POA: Diagnosis not present

## 2023-12-24 DIAGNOSIS — I77810 Thoracic aortic ectasia: Secondary | ICD-10-CM | POA: Diagnosis not present

## 2023-12-24 DIAGNOSIS — I472 Ventricular tachycardia, unspecified: Secondary | ICD-10-CM | POA: Diagnosis not present

## 2023-12-24 DIAGNOSIS — I480 Paroxysmal atrial fibrillation: Secondary | ICD-10-CM | POA: Diagnosis not present

## 2023-12-24 DIAGNOSIS — I5032 Chronic diastolic (congestive) heart failure: Secondary | ICD-10-CM | POA: Diagnosis not present

## 2023-12-24 DIAGNOSIS — I872 Venous insufficiency (chronic) (peripheral): Secondary | ICD-10-CM | POA: Diagnosis not present

## 2023-12-24 DIAGNOSIS — L97212 Non-pressure chronic ulcer of right calf with fat layer exposed: Secondary | ICD-10-CM | POA: Diagnosis not present

## 2023-12-29 DIAGNOSIS — I472 Ventricular tachycardia, unspecified: Secondary | ICD-10-CM | POA: Diagnosis not present

## 2023-12-29 DIAGNOSIS — L97212 Non-pressure chronic ulcer of right calf with fat layer exposed: Secondary | ICD-10-CM | POA: Diagnosis not present

## 2023-12-29 DIAGNOSIS — I2510 Atherosclerotic heart disease of native coronary artery without angina pectoris: Secondary | ICD-10-CM | POA: Diagnosis not present

## 2023-12-29 DIAGNOSIS — I77810 Thoracic aortic ectasia: Secondary | ICD-10-CM | POA: Diagnosis not present

## 2023-12-29 DIAGNOSIS — I5032 Chronic diastolic (congestive) heart failure: Secondary | ICD-10-CM | POA: Diagnosis not present

## 2023-12-29 DIAGNOSIS — I480 Paroxysmal atrial fibrillation: Secondary | ICD-10-CM | POA: Diagnosis not present

## 2023-12-29 DIAGNOSIS — I110 Hypertensive heart disease with heart failure: Secondary | ICD-10-CM | POA: Diagnosis not present

## 2023-12-29 DIAGNOSIS — I872 Venous insufficiency (chronic) (peripheral): Secondary | ICD-10-CM | POA: Diagnosis not present

## 2023-12-29 DIAGNOSIS — M797 Fibromyalgia: Secondary | ICD-10-CM | POA: Diagnosis not present

## 2024-01-04 DIAGNOSIS — Z96652 Presence of left artificial knee joint: Secondary | ICD-10-CM | POA: Diagnosis not present

## 2024-01-04 DIAGNOSIS — M1711 Unilateral primary osteoarthritis, right knee: Secondary | ICD-10-CM | POA: Diagnosis not present

## 2024-01-04 DIAGNOSIS — Z471 Aftercare following joint replacement surgery: Secondary | ICD-10-CM | POA: Diagnosis not present

## 2024-01-05 DIAGNOSIS — M5136 Other intervertebral disc degeneration, lumbar region with discogenic back pain only: Secondary | ICD-10-CM | POA: Diagnosis not present

## 2024-01-06 DIAGNOSIS — I872 Venous insufficiency (chronic) (peripheral): Secondary | ICD-10-CM | POA: Diagnosis not present

## 2024-01-06 DIAGNOSIS — M797 Fibromyalgia: Secondary | ICD-10-CM | POA: Diagnosis not present

## 2024-01-06 DIAGNOSIS — I2510 Atherosclerotic heart disease of native coronary artery without angina pectoris: Secondary | ICD-10-CM | POA: Diagnosis not present

## 2024-01-06 DIAGNOSIS — I77810 Thoracic aortic ectasia: Secondary | ICD-10-CM | POA: Diagnosis not present

## 2024-01-06 DIAGNOSIS — I110 Hypertensive heart disease with heart failure: Secondary | ICD-10-CM | POA: Diagnosis not present

## 2024-01-06 DIAGNOSIS — I5032 Chronic diastolic (congestive) heart failure: Secondary | ICD-10-CM | POA: Diagnosis not present

## 2024-01-06 DIAGNOSIS — L97212 Non-pressure chronic ulcer of right calf with fat layer exposed: Secondary | ICD-10-CM | POA: Diagnosis not present

## 2024-01-06 DIAGNOSIS — I480 Paroxysmal atrial fibrillation: Secondary | ICD-10-CM | POA: Diagnosis not present

## 2024-01-06 DIAGNOSIS — I472 Ventricular tachycardia, unspecified: Secondary | ICD-10-CM | POA: Diagnosis not present

## 2024-01-25 ENCOUNTER — Encounter (HOSPITAL_COMMUNITY): Payer: Self-pay

## 2024-01-25 ENCOUNTER — Other Ambulatory Visit: Payer: Self-pay

## 2024-01-25 ENCOUNTER — Emergency Department (HOSPITAL_COMMUNITY)
Admission: EM | Admit: 2024-01-25 | Discharge: 2024-01-25 | Disposition: A | Discharge status: Home / Self Care | Attending: Emergency Medicine | Admitting: Emergency Medicine

## 2024-01-25 DIAGNOSIS — I5032 Chronic diastolic (congestive) heart failure: Secondary | ICD-10-CM | POA: Diagnosis not present

## 2024-01-25 DIAGNOSIS — R58 Hemorrhage, not elsewhere classified: Secondary | ICD-10-CM | POA: Diagnosis not present

## 2024-01-25 DIAGNOSIS — R42 Dizziness and giddiness: Secondary | ICD-10-CM | POA: Diagnosis not present

## 2024-01-25 DIAGNOSIS — I83892 Varicose veins of left lower extremities with other complications: Secondary | ICD-10-CM | POA: Diagnosis not present

## 2024-01-25 DIAGNOSIS — Z7901 Long term (current) use of anticoagulants: Secondary | ICD-10-CM | POA: Diagnosis not present

## 2024-01-25 DIAGNOSIS — I83891 Varicose veins of right lower extremities with other complications: Secondary | ICD-10-CM | POA: Diagnosis not present

## 2024-01-25 DIAGNOSIS — Z79899 Other long term (current) drug therapy: Secondary | ICD-10-CM | POA: Diagnosis not present

## 2024-01-25 DIAGNOSIS — I110 Hypertensive heart disease with heart failure: Secondary | ICD-10-CM | POA: Diagnosis not present

## 2024-01-25 DIAGNOSIS — I2510 Atherosclerotic heart disease of native coronary artery without angina pectoris: Secondary | ICD-10-CM | POA: Insufficient documentation

## 2024-01-25 DIAGNOSIS — I83899 Varicose veins of unspecified lower extremities with other complications: Secondary | ICD-10-CM

## 2024-01-25 LAB — CBC WITH DIFFERENTIAL/PLATELET
Abs Immature Granulocytes: 0.02 K/uL (ref 0.00–0.07)
Abs Immature Granulocytes: 0.02 K/uL (ref 0.00–0.07)
Basophils Absolute: 0 K/uL (ref 0.0–0.1)
Basophils Absolute: 0 K/uL (ref 0.0–0.1)
Basophils Relative: 0 %
Basophils Relative: 0 %
Eosinophils Absolute: 0.1 K/uL (ref 0.0–0.5)
Eosinophils Absolute: 0 K/uL (ref 0.0–0.5)
Eosinophils Relative: 1 %
Eosinophils Relative: 0 %
HCT: 28 % — ABNORMAL LOW (ref 39.0–52.0)
HCT: 28 % — ABNORMAL LOW (ref 39.0–52.0)
Hemoglobin: 8.5 g/dL — ABNORMAL LOW (ref 13.0–17.0)
Hemoglobin: 8.4 g/dL — ABNORMAL LOW (ref 13.0–17.0)
Immature Granulocytes: 0 %
Immature Granulocytes: 0 %
Lymphocytes Relative: 17 %
Lymphocytes Relative: 9 %
Lymphs Abs: 0.8 K/uL (ref 0.7–4.0)
Lymphs Abs: 0.6 K/uL — ABNORMAL LOW (ref 0.7–4.0)
MCH: 25.3 pg — ABNORMAL LOW (ref 26.0–34.0)
MCH: 24.7 pg — ABNORMAL LOW (ref 26.0–34.0)
MCHC: 30.4 g/dL (ref 30.0–36.0)
MCHC: 30 g/dL (ref 30.0–36.0)
MCV: 83.3 fL (ref 80.0–100.0)
MCV: 82.4 fL (ref 80.0–100.0)
Monocytes Absolute: 0.3 K/uL (ref 0.1–1.0)
Monocytes Absolute: 0.4 K/uL (ref 0.1–1.0)
Monocytes Relative: 8 %
Monocytes Relative: 7 %
Neutro Abs: 3.3 K/uL (ref 1.7–7.7)
Neutro Abs: 5.6 K/uL (ref 1.7–7.7)
Neutrophils Relative %: 74 %
Neutrophils Relative %: 84 %
Platelets: 151 K/uL (ref 150–400)
Platelets: 158 K/uL (ref 150–400)
RBC: 3.36 MIL/uL — ABNORMAL LOW (ref 4.22–5.81)
RBC: 3.4 MIL/uL — ABNORMAL LOW (ref 4.22–5.81)
RDW: 19.1 % — ABNORMAL HIGH (ref 11.5–15.5)
RDW: 19.3 % — ABNORMAL HIGH (ref 11.5–15.5)
WBC: 4.5 K/uL (ref 4.0–10.5)
WBC: 6.7 K/uL (ref 4.0–10.5)
nRBC: 0 % (ref 0.0–0.2)
nRBC: 0 % (ref 0.0–0.2)

## 2024-01-25 LAB — COMPREHENSIVE METABOLIC PANEL WITH GFR
ALT: 13 U/L (ref 0–44)
AST: 18 U/L (ref 15–41)
Albumin: 2.8 g/dL — ABNORMAL LOW (ref 3.5–5.0)
Alkaline Phosphatase: 35 U/L — ABNORMAL LOW (ref 38–126)
Anion gap: 9 (ref 5–15)
BUN: 16 mg/dL (ref 8–23)
CO2: 23 mmol/L (ref 22–32)
Calcium: 8.3 mg/dL — ABNORMAL LOW (ref 8.9–10.3)
Chloride: 109 mmol/L (ref 98–111)
Creatinine, Ser: 1.03 mg/dL (ref 0.61–1.24)
GFR, Estimated: >60 mL/min (ref >60)
Glucose, Bld: 141 mg/dL — ABNORMAL HIGH (ref 70–99)
Potassium: 4 mmol/L (ref 3.5–5.1)
Sodium: 141 mmol/L (ref 135–145)
Total Bilirubin: 0.4 mg/dL (ref 0.0–1.2)
Total Protein: 4.8 g/dL — ABNORMAL LOW (ref 6.5–8.1)

## 2024-01-25 LAB — I-STAT CHEM 8, ED
BUN: 13 mg/dL (ref 8–23)
Calcium, Ion: 1.08 mmol/L — ABNORMAL LOW (ref 1.15–1.40)
Chloride: 108 mmol/L (ref 98–111)
Creatinine, Ser: 1 mg/dL (ref 0.61–1.24)
Glucose, Bld: 135 mg/dL — ABNORMAL HIGH (ref 70–99)
HCT: 25 % — ABNORMAL LOW (ref 39.0–52.0)
Hemoglobin: 8.5 g/dL — ABNORMAL LOW (ref 13.0–17.0)
Potassium: 3.9 mmol/L (ref 3.5–5.1)
Sodium: 141 mmol/L (ref 135–145)
TCO2: 21 mmol/L — ABNORMAL LOW (ref 22–32)

## 2024-01-25 LAB — I-STAT CG4 LACTIC ACID, ED: Lactic Acid, Venous: 1.3 mmol/L (ref 0.5–1.9)

## 2024-01-25 LAB — TYPE AND SCREEN
ABO/RH(D): O POS
Antibody Screen: NEGATIVE

## 2024-01-25 MED ORDER — LACTATED RINGERS IV BOLUS
1000.0000 mL | Freq: Once | INTRAVENOUS | Status: AC
  Administered 2024-01-25: 1000 mL via INTRAVENOUS

## 2024-01-25 NOTE — ED Provider Notes (Signed)
  Physical Exam  BP (!) 125/104 (BP Location: Right Arm)   Pulse 90   Temp 97.9 F (36.6 C) (Oral)   Resp 17   Ht 6' 2 (1.88 m)   Wt 68.9 kg   SpO2 100%   BMI 19.52 kg/m   Physical Exam  Procedures  Procedures  ED Course / MDM   Clinical Course as of 01/25/24 1610  Tue Jan 25, 2024  1550 Assumed care from Dr Dreama. 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 Controlled with combat gauze. No bony injury. Hgb is 8.5. Family is worried about him going home and getting weak which has happened to him in the past. Needs repeat CBC. Has ambulated with the dressing on.  [RP]  1601 Hemoglobin(!): 8.4 Unchanged from prior [RP]  1610 Reassessed.  Patient able to ambulate without difficulty in the emergency department.  With his hemoglobin being stable feel that he suitable for discharge at this time.  Will have him follow-up with his primary doctor and vascular surgery [RP]    Clinical Course User Index [RP] Yolande Lamar BROCKS, MD   Medical Decision Making Amount and/or Complexity of Data Reviewed Labs: ordered. Decision-making details documented in ED Course.      Yolande Lamar BROCKS, MD 01/25/24 [phone]

## 2024-01-25 NOTE — Progress Notes (Signed)
 Orthopedic Tech Progress Note Patient Details:  TAEJON IRANI 12/07/1940 [id number] Level 2 Trauma, Not needed Patient ID: MARC LEICHTER, male   DOB: June 22, 1941, 83 y.o.   MRN: [id number]  Efrain DELENA Cos 01/25/2024, 9:58 AM

## 2024-01-25 NOTE — ED Triage Notes (Addendum)
 Pt came in from home. Pt got out of the shower this am and began bleeding from his leg. pt did not loose conciseness. Pt denies falling or hitting head. Alert and oriented x4. Ems reports large amount of blood on the floor of the home.    No hypotension from ems.   Bp 110/70.  Hr 70 Rr 16 Cbg 180

## 2024-01-25 NOTE — ED Notes (Signed)
 Patient verbalizes understanding of discharge instructions. Opportunity for questioning and answers were provided. Pt discharged from ED.

## 2024-01-25 NOTE — Progress Notes (Signed)
   01/25/24 1000  Spiritual Encounters  Type of Visit Initial  Care provided to: Patient  [place] partners present during encounter Nurse;Physician;Other (comment)  Referral source Trauma page  Reason for visit Trauma  OnCall Visit No   Responded to level II trauma, However patient was down graded to non-trauma while chaplain was present. Chaplain not needed at this time.

## 2024-01-25 NOTE — ED Notes (Signed)
 Pt ambulated with assistance. No bleeding noted from bandaged foot.

## 2024-01-25 NOTE — Discharge Instructions (Signed)
 You were seen for your bleeding varicose vein in the emergency department.   At home, please leave the dressing on for 24 hours.  After that you may change it daily.  If your leg starts bleeding please hold constant pressure for 15 minutes.  If that does not work try again and then call 911 if it does not resolve.  Check your MyChart online for the results of any tests that had not resulted by the time you left the emergency department.   Follow-up with your primary doctor in 2-3 days regarding your visit.  Follow-up with your vascular surgeon soon as possible.  Return immediately to the emergency department if you experience any of the following: Bleeding, weakness, fainting, shortness of breath, or any other concerning symptoms.    Thank you for visiting our Emergency Department. It was a pleasure taking care of you today.

## 2024-01-25 NOTE — ED Notes (Signed)
 Trauma Response Nurse Documentation   [name] is a 83 y.o. male arriving to [facility] ED via EMS  On Eliquis  (apixaban ) daily. Trauma was activated as a Level 2 by ED Charge RN based on the following trauma criteria potential Grossly contaminated open fractures.  Pt was downgraded to non-trauma within 5 minutes of patient's arrival.   GCS 15.  History   Past Medical History:  Diagnosis Date   Ascending aorta dilatation (HCC)    44mm by chest CT angio 04/2018   CAD (coronary artery disease)    a. 05/2006 Abnl stress test w/ 2-26mm ST dep; b. 05/2006 Cath/PCI: LM nl, LAD 30-40ost, 20-30p, 90d (small), D1 nl, D2 nl, LCX nl, OM1/2 nl, RCA Ca2+, 50m/d (3.5x16 Liberty BMS & 3.5x12 Liberty BMS), EF 50%.   Chronic diastolic CHF (congestive heart failure) (HCC)    a. 10/2017 Echo: EF 50-55%, mild AI, sev dil LA, mod dil RA.   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 CHA2DS2VASc 5-->Eliquis ; b. Prev WCT on flecainide;  c. 12/2017 s/p DCCV.   Pneumothorax    Prolonged Q-T interval on ECG 06/22/2022   Ventricular tachycardia (HCC)    a. possibly proarrhythmia from flecainide.     Past Surgical History:  Procedure Laterality Date   CARDIOVERSION N/A 12/23/2017   Procedure: CARDIOVERSION;  Surgeon: Shlomo Wilbert SAUNDERS, MD;  Location: [facility] ENDOSCOPY;  Service: Cardiovascular;  Laterality: N/A;   CARDIOVERSION N/A 03/08/2018   Procedure: CARDIOVERSION;  Surgeon: Shlomo Wilbert SAUNDERS, MD;  Location: [facility] ENDOSCOPY;  Service: Cardiovascular;  Laterality: N/A;   INGUINAL HERNIA REPAIR     x2   left total hip surgery     TEE WITHOUT CARDIOVERSION N/A 03/08/2018   Procedure: TRANSESOPHAGEAL ECHOCARDIOGRAM (TEE);  Surgeon: Shlomo Wilbert SAUNDERS, MD;  Location: [facility] ENDOSCOPY;  Service: Cardiovascular;  Laterality: N/A;   TOTAL HIP ARTHROPLASTY Right 08/19/2020   Procedure: TOTAL HIP ARTHROPLASTY ANTERIOR  APPROACH;  Surgeon: Melodi Lerner, MD;  Location: [facility] ORS;  Service: Orthopedics;  Laterality: Right;    TOTAL KNEE ARTHROPLASTY Left 02/28/2018   Procedure: LEFT TOTAL KNEE ARTHROPLASTY;  Surgeon: Melodi Lerner, MD;  Location: [facility] ORS;  Service: Orthopedics;  Laterality: Left;   VEIN LIGATION AND STRIPPING Left 04/08/2020   Procedure: LEFT LOWER EXTREMITY VEIN LIGATION AND EXCISION OF ULCER;  Surgeon: Eliza Lonni RAMAN, MD;  [facility];  Service: Vascular;  Laterality: Left;     Initial Focused Assessment (If applicable, or please see trauma documentation): Airway: patent, clear. Breathing: Breath sound clear, equal bilaterally. No CP or SOB. Circulation: Small puncture-looking wound just to the side of the R lateral malleolus. Bleeding controlled with gauze and kerlix upon arrival to ED. Pedal pulses palpable. No numbness or tingling; sensation intact. Wiggles toes. No pain at the actual bone upon palpation. A-fib on the monitor. Manual BP 132/68. 20G PIV to L wrist.  Disability: A/Ox4. PERRLA. MAE equally. No Neck pain. Did not hit head.   CT's Completed:   none   Interventions:  Manual BP obtained (132/68).  Pt undressed and assessed  Warm blanket applied.  Assessed bleeding and circulation to RLE.  Cleansed and redressed RLE wound with quick clot and gauze drsg  Plan for disposition:  Other Unsure at this time.   Consults completed:  Orthopaedic Surgeon at [facility] came to bedside at 0945 prior to pt's arrival due  to potential open ankle injury.  Which was then r/o on pt arrival.   Event Summary: Pt was BIB [facility] after he sustained a puncture wound to his RLE while getting out of the shower this morning.  Pt is on eliquis  for a-fib and presumably knocked his leg on something while getting out of the shower. Per EMS and FD, pt lost at least 1-2L of blood.  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 Bleeding is currently controlled at this time.    Bedside handoff with ED RN Alan FABIENE LEBRON ROCKIE LELON  Trauma Response RN  Please call TRN at [phone] for further assistance.

## 2024-01-25 NOTE — ED Notes (Signed)
 Pt given phone and instructed how to use it. Pt to call daughter

## 2024-01-25 NOTE — ED Provider Notes (Signed)
 [name] [facility] Provider Note   CSN: [id number] Arrival date & time: 01/25/24  9052     Patient presents with: level 2 ankle inury/sig blood loss and Leg Injury   [name] is a 83 y.o. male.  {Add pertinent medical, surgical, social history, OB history to HPI:32947} HPI     Prior to Admission medications   Medication Sig Start Date End Date Taking? Authorizing Provider  acetaminophen  (TYLENOL ) 325 MG tablet Take 2 tablets (650 mg total) by mouth every 6 (six) hours as needed for moderate pain. 03/17/22   Christopher Savannah, PA-C  apixaban  (ELIQUIS ) 2.5 MG TABS tablet Take 1 tablet (2.5 mg total) by mouth 2 (two) times daily. 06/04/23   Cindie Ole DASEN, MD  atorvastatin  (LIPITOR) 20 MG tablet TAKE 1 TABLET(20 MG) BY MOUTH DAILY 06/04/23   Shlomo Wilbert SAUNDERS, MD  Boswellia Serrata (BOSWELLIA PO) Take 1,200 mg by mouth daily.    [provider]  carvedilol  (COREG ) 6.25 MG tablet Take 1 tablet (6.25 mg total) by mouth 2 (two) times daily. 10/20/23   Cindie Ole DASEN, MD  furosemide  (LASIX ) 20 MG tablet Take 40 mg by mouth 2 (two) times daily.    [provider]  lidocaine  (LIDODERM ) 5 % Place 1 patch onto the skin daily.    [provider]  Misc Natural Products (GLUCOSAMINE CHONDROITIN TRIPLE) TABS Take 1 tablet by mouth daily.    [provider]  Multiple Vitamin (MULTIVITAMIN WITH MINERALS) TABS tablet Take 1 tablet by mouth daily.    [provider]  Omega-3 Fatty Acids (FISH OIL) 1200 MG CAPS Take 1,200 mg by mouth daily.    [provider]    Allergies: Patient has no known allergies.    Review of Systems  Updated Vital Signs BP (!) 124/94   Pulse 93   Temp (!) 96.9 F (36.1 C) (Temporal)   Resp 19   Ht 6' 2 (1.88 m)   Wt 68.9 kg   SpO2 98%   BMI 19.52 kg/m   Physical Exam  (all labs ordered are listed, but only abnormal results are displayed) Labs Reviewed  CBC WITH  DIFFERENTIAL/PLATELET - Abnormal; Notable for the following components:      Result Value   RBC 3.36 (*)    Hemoglobin 8.5 (*)    HCT 28.0 (*)    MCH 25.3 (*)    RDW 19.1 (*)    All other components within normal limits  COMPREHENSIVE METABOLIC PANEL WITH GFR - Abnormal; Notable for the following components:   Glucose, Bld 141 (*)    Calcium  8.3 (*)    Total Protein 4.8 (*)    Albumin 2.8 (*)    Alkaline Phosphatase 35 (*)    All other components within normal limits  I-STAT CHEM 8, ED - Abnormal; Notable for the following components:   Glucose, Bld 135 (*)    Calcium , Ion 1.08 (*)    TCO2 21 (*)    Hemoglobin 8.5 (*)    HCT 25.0 (*)    All other components within normal limits  I-STAT CG4 LACTIC ACID, ED    EKG: None  Radiology: No results found.  {Document cardiac monitor, telemetry assessment procedure when appropriate:32947} Procedures   Medications Ordered in the ED - No data to display    {Click here for ABCD2, HEART and other calculators REFRESH Note before signing:1}  Medical Decision Making Amount and/or Complexity of Data Reviewed Labs: ordered.   ***  {Document critical care time when appropriate  Document review of labs and clinical decision tools ie CHADS2VASC2, etc  Document your independent review of radiology images and any outside records  Document your discussion with family members, caretakers and with consultants  Document social determinants of health affecting pt's care  Document your decision making why or why not admission, treatments were needed:32947:::1}   Final diagnoses:  None    ED Discharge Orders     None

## 2024-02-02 ENCOUNTER — Telehealth: Payer: Self-pay

## 2024-02-02 ENCOUNTER — Other Ambulatory Visit: Payer: Self-pay | Admitting: *Deleted

## 2024-02-02 DIAGNOSIS — I83891 Varicose veins of right lower extremities with other complications: Secondary | ICD-10-CM

## 2024-02-02 NOTE — Telephone Encounter (Signed)
 Pharmacy please advise on holding Eliquis  for 2 days prior to ENDOVENOUS LASER ABLATION RIGHT GREATER SAPHENOUS VEIN  scheduled for 03/23/2024. Thank you.

## 2024-02-02 NOTE — Telephone Encounter (Signed)
..     Pre-operative Risk Assessment    Patient Name: [name]  DOB: 02/13/1941 MRN: [id number]   Date of last office visit: 10/20/23 Date of next office visit: NONE   Request for Surgical Clearance    Procedure:  ENDOVENOUS LASER ABLATION RIGHT GREATER SAPHENOUS VEIN   Date of Surgery:  Clearance 03/23/24                                Surgeon:  DR PENNE COLORADO Surgeon's Group or Practice Name:  [facility] Phone number:  [phone] Fax number:  [phone]   Type of Clearance Requested:   - Medical  - Pharmacy:  Hold Apixaban  (Eliquis ) HOLD FOR 2 DAYS   Type of Anesthesia:  Local    Additional requests/questions:    [name]   02/02/2024, 1:35 PM

## 2024-02-03 NOTE — Telephone Encounter (Addendum)
 They need the Eliquis  held for 2 days before ablation, the day of the ablation and 2 days after the ablation for a total of five days

## 2024-02-08 ENCOUNTER — Encounter: Payer: Self-pay | Admitting: Cardiology

## 2024-02-08 NOTE — Telephone Encounter (Signed)
 Error

## 2024-02-11 ENCOUNTER — Telehealth: Payer: Self-pay | Admitting: *Deleted

## 2024-02-11 NOTE — Telephone Encounter (Signed)
 Pt has been scheduled tele preop appt 03/06/24. Med rec and consent are done.

## 2024-02-11 NOTE — Telephone Encounter (Signed)
 Pt has been scheduled tele preop appt 03/06/24. Med rec and consent are done.      Patient Consent for Virtual Visit        COSTANTINO KOHLBECK has provided verbal consent on 02/11/2024 for a virtual visit (video or telephone).   CONSENT FOR VIRTUAL VISIT FOR:  Debby SHAUNNA Cornwall  By participating in this virtual visit I agree to the following:  I hereby voluntarily request, consent and authorize [facility] and its employed or contracted physicians, physician assistants, nurse practitioners or other licensed health care professionals (the Practitioner), to provide me with telemedicine health care services (the "Services) as deemed necessary by the treating Practitioner. I acknowledge and consent to receive the Services by the Practitioner via telemedicine. I understand that the telemedicine visit will involve communicating with the Practitioner through live audiovisual communication technology and the disclosure of certain medical information by electronic transmission. I acknowledge that I have been given the opportunity to request an in-person assessment or other available alternative prior to the telemedicine visit and am voluntarily participating in the telemedicine visit.  I understand that I have the right to withhold or withdraw my consent to the use of telemedicine in the course of my care at any time, without affecting my right to future care or treatment, and that the Practitioner or I may terminate the telemedicine visit at any time. I understand that I have the right to inspect all information obtained and/or recorded in the course of the telemedicine visit and may receive copies of available information for a reasonable fee.  I understand that some of the potential risks of receiving the Services via telemedicine include:  Delay or interruption in medical evaluation due to technological equipment failure or disruption; Information transmitted may not be sufficient (e.g. poor  resolution of images) to allow for appropriate medical decision making by the Practitioner; and/or  In rare instances, security protocols could fail, causing a breach of personal health information.  Furthermore, I acknowledge that it is my responsibility to provide information about my medical history, conditions and care that is complete and accurate to the best of my ability. I acknowledge that Practitioner's advice, recommendations, and/or decision may be based on factors not within their control, such as incomplete or inaccurate data provided by me or distortions of diagnostic images or specimens that may result from electronic transmissions. I understand that the practice of medicine is not an exact science and that Practitioner makes no warranties or guarantees regarding treatment outcomes. I acknowledge that a copy of this consent can be made available to me via my patient portal [facility] MyChart), or I can request a printed copy by calling the office of [facility].    I understand that my insurance will be billed for this visit.   I have read or had this consent read to me. I understand the contents of this consent, which adequately explains the benefits and risks of the Services being provided via telemedicine.  I have been provided ample opportunity to ask questions regarding this consent and the Services and have had my questions answered to my satisfaction. I give my informed consent for the services to be provided through the use of telemedicine in my medical care

## 2024-02-11 NOTE — Telephone Encounter (Signed)
 Patient with diagnosis of atrial fibrillation on Eliquis  for anticoagulation.    Procedure:  ENDOVENOUS LASER ABLATION RIGHT GREATER SAPHENOUS VEIN    Date of Surgery:  Clearance 03/23/24   CHA2DS2-VASc Score = 5   This indicates a 7.2% annual risk of stroke. The patient's score is based upon: CHF History: 1 HTN History: 1 Diabetes History: 0 Stroke History: 0 Vascular Disease History: 1 Age Score: 2 Gender Score: 0   CrCl 57 Platelet count 158  Patient has not had an Afib/aflutter ablation within the last 3 months or DCCV within the last 30 days  Per office protocol, patient can hold Eliquis  for 2 days prior to procedure.   Patient will not need bridging with Lovenox (enoxaparin) around procedure.  **This guidance is not considered finalized until pre-operative APP has relayed final recommendations.**

## 2024-02-11 NOTE — Telephone Encounter (Signed)
   Name: [name]  DOB: August 03, 1940  MRN: [id number]  Primary Cardiologist: Wilbert Bihari, MD   Preoperative team, please contact this patient and set up a phone call appointment for further preoperative risk assessment. Please obtain consent and complete medication review. Thank you for your help.  I confirm that guidance regarding antiplatelet and oral anticoagulation therapy has been completed and, if necessary, noted below.  Per office protocol, patient can hold Eliquis  for 2 days prior to procedure.   Patient will not need bridging with Lovenox (enoxaparin) around procedure.  I also confirmed the patient resides in the state of [place] . As per [facility] Medical Board telemedicine laws, the patient must reside in the state in which the provider is licensed.   Lamarr Satterfield, NP 02/11/2024, 9:52 AM [facility]

## 2024-02-12 ENCOUNTER — Emergency Department (HOSPITAL_COMMUNITY)

## 2024-02-12 ENCOUNTER — Inpatient Hospital Stay (HOSPITAL_COMMUNITY)
Admission: EM | Admit: 2024-02-12 | Discharge: 2024-02-20 | DRG: 535 | Disposition: A | Discharge status: Skilled Nursing Facility | Attending: Internal Medicine | Admitting: Internal Medicine

## 2024-02-12 DIAGNOSIS — Y92009 Unspecified place in unspecified non-institutional (private) residence as the place of occurrence of the external cause: Secondary | ICD-10-CM

## 2024-02-12 DIAGNOSIS — Z96641 Presence of right artificial hip joint: Secondary | ICD-10-CM | POA: Diagnosis present

## 2024-02-12 DIAGNOSIS — Z96643 Presence of artificial hip joint, bilateral: Secondary | ICD-10-CM | POA: Diagnosis not present

## 2024-02-12 DIAGNOSIS — S3289XA Fracture of other parts of pelvis, initial encounter for closed fracture: Secondary | ICD-10-CM | POA: Diagnosis not present

## 2024-02-12 DIAGNOSIS — I2510 Atherosclerotic heart disease of native coronary artery without angina pectoris: Secondary | ICD-10-CM | POA: Diagnosis present

## 2024-02-12 DIAGNOSIS — M25521 Pain in right elbow: Secondary | ICD-10-CM | POA: Diagnosis not present

## 2024-02-12 DIAGNOSIS — R339 Retention of urine, unspecified: Secondary | ICD-10-CM | POA: Diagnosis present

## 2024-02-12 DIAGNOSIS — W19XXXA Unspecified fall, initial encounter: Secondary | ICD-10-CM | POA: Diagnosis not present

## 2024-02-12 DIAGNOSIS — I5032 Chronic diastolic (congestive) heart failure: Secondary | ICD-10-CM | POA: Diagnosis present

## 2024-02-12 DIAGNOSIS — I495 Sick sinus syndrome: Secondary | ICD-10-CM | POA: Diagnosis present

## 2024-02-12 DIAGNOSIS — S32511D Fracture of superior rim of right pubis, subsequent encounter for fracture with routine healing: Secondary | ICD-10-CM | POA: Diagnosis not present

## 2024-02-12 DIAGNOSIS — T45515A Adverse effect of anticoagulants, initial encounter: Secondary | ICD-10-CM | POA: Diagnosis present

## 2024-02-12 DIAGNOSIS — I110 Hypertensive heart disease with heart failure: Secondary | ICD-10-CM | POA: Diagnosis present

## 2024-02-12 DIAGNOSIS — Z79899 Other long term (current) drug therapy: Secondary | ICD-10-CM | POA: Diagnosis not present

## 2024-02-12 DIAGNOSIS — R410 Disorientation, unspecified: Secondary | ICD-10-CM | POA: Diagnosis not present

## 2024-02-12 DIAGNOSIS — I672 Cerebral atherosclerosis: Secondary | ICD-10-CM | POA: Diagnosis not present

## 2024-02-12 DIAGNOSIS — I517 Cardiomegaly: Secondary | ICD-10-CM | POA: Diagnosis not present

## 2024-02-12 DIAGNOSIS — R102 Pelvic and perineal pain: Secondary | ICD-10-CM | POA: Diagnosis not present

## 2024-02-12 DIAGNOSIS — Z7901 Long term (current) use of anticoagulants: Secondary | ICD-10-CM | POA: Diagnosis not present

## 2024-02-12 DIAGNOSIS — G9341 Metabolic encephalopathy: Secondary | ICD-10-CM | POA: Diagnosis not present

## 2024-02-12 DIAGNOSIS — E785 Hyperlipidemia, unspecified: Secondary | ICD-10-CM | POA: Diagnosis present

## 2024-02-12 DIAGNOSIS — R0602 Shortness of breath: Secondary | ICD-10-CM | POA: Diagnosis not present

## 2024-02-12 DIAGNOSIS — R0902 Hypoxemia: Secondary | ICD-10-CM | POA: Diagnosis not present

## 2024-02-12 DIAGNOSIS — I700 Atherosclerosis of aorta: Secondary | ICD-10-CM | POA: Diagnosis not present

## 2024-02-12 DIAGNOSIS — W010XXA Fall on same level from slipping, tripping and stumbling without subsequent striking against object, initial encounter: Secondary | ICD-10-CM | POA: Diagnosis present

## 2024-02-12 DIAGNOSIS — D6832 Hemorrhagic disorder due to extrinsic circulating anticoagulants: Secondary | ICD-10-CM | POA: Diagnosis present

## 2024-02-12 DIAGNOSIS — D649 Anemia, unspecified: Secondary | ICD-10-CM | POA: Diagnosis not present

## 2024-02-12 DIAGNOSIS — S0990XA Unspecified injury of head, initial encounter: Secondary | ICD-10-CM | POA: Diagnosis not present

## 2024-02-12 DIAGNOSIS — S3210XA Unspecified fracture of sacrum, initial encounter for closed fracture: Secondary | ICD-10-CM | POA: Diagnosis not present

## 2024-02-12 DIAGNOSIS — Z955 Presence of coronary angioplasty implant and graft: Secondary | ICD-10-CM

## 2024-02-12 DIAGNOSIS — R079 Chest pain, unspecified: Secondary | ICD-10-CM | POA: Diagnosis not present

## 2024-02-12 DIAGNOSIS — S199XXA Unspecified injury of neck, initial encounter: Secondary | ICD-10-CM | POA: Diagnosis not present

## 2024-02-12 DIAGNOSIS — Z96652 Presence of left artificial knee joint: Secondary | ICD-10-CM | POA: Diagnosis present

## 2024-02-12 DIAGNOSIS — M1711 Unilateral primary osteoarthritis, right knee: Secondary | ICD-10-CM | POA: Diagnosis not present

## 2024-02-12 DIAGNOSIS — S3282XA Multiple fractures of pelvis without disruption of pelvic ring, initial encounter for closed fracture: Secondary | ICD-10-CM | POA: Diagnosis present

## 2024-02-12 DIAGNOSIS — I10 Essential (primary) hypertension: Secondary | ICD-10-CM | POA: Diagnosis not present

## 2024-02-12 DIAGNOSIS — M4802 Spinal stenosis, cervical region: Secondary | ICD-10-CM | POA: Diagnosis not present

## 2024-02-12 DIAGNOSIS — S32512A Fracture of superior rim of left pubis, initial encounter for closed fracture: Secondary | ICD-10-CM | POA: Diagnosis not present

## 2024-02-12 DIAGNOSIS — M549 Dorsalgia, unspecified: Secondary | ICD-10-CM | POA: Diagnosis present

## 2024-02-12 DIAGNOSIS — M25561 Pain in right knee: Secondary | ICD-10-CM | POA: Diagnosis not present

## 2024-02-12 DIAGNOSIS — I4819 Other persistent atrial fibrillation: Secondary | ICD-10-CM | POA: Diagnosis present

## 2024-02-12 DIAGNOSIS — D62 Acute posthemorrhagic anemia: Secondary | ICD-10-CM | POA: Diagnosis present

## 2024-02-12 DIAGNOSIS — M25562 Pain in left knee: Secondary | ICD-10-CM | POA: Diagnosis not present

## 2024-02-12 DIAGNOSIS — S32592A Other specified fracture of left pubis, initial encounter for closed fracture: Secondary | ICD-10-CM | POA: Diagnosis present

## 2024-02-12 DIAGNOSIS — R41841 Cognitive communication deficit: Secondary | ICD-10-CM | POA: Diagnosis not present

## 2024-02-12 DIAGNOSIS — J90 Pleural effusion, not elsewhere classified: Secondary | ICD-10-CM | POA: Diagnosis not present

## 2024-02-12 DIAGNOSIS — R4181 Age-related cognitive decline: Secondary | ICD-10-CM | POA: Diagnosis present

## 2024-02-12 DIAGNOSIS — G8911 Acute pain due to trauma: Secondary | ICD-10-CM | POA: Diagnosis not present

## 2024-02-12 DIAGNOSIS — R918 Other nonspecific abnormal finding of lung field: Secondary | ICD-10-CM | POA: Diagnosis not present

## 2024-02-12 DIAGNOSIS — I480 Paroxysmal atrial fibrillation: Secondary | ICD-10-CM | POA: Diagnosis not present

## 2024-02-12 DIAGNOSIS — Z8249 Family history of ischemic heart disease and other diseases of the circulatory system: Secondary | ICD-10-CM | POA: Diagnosis not present

## 2024-02-12 DIAGNOSIS — S32511A Fracture of superior rim of right pubis, initial encounter for closed fracture: Secondary | ICD-10-CM | POA: Diagnosis not present

## 2024-02-12 DIAGNOSIS — M6281 Muscle weakness (generalized): Secondary | ICD-10-CM | POA: Diagnosis not present

## 2024-02-12 DIAGNOSIS — R2681 Unsteadiness on feet: Secondary | ICD-10-CM | POA: Diagnosis not present

## 2024-02-12 DIAGNOSIS — M545 Low back pain, unspecified: Secondary | ICD-10-CM | POA: Diagnosis not present

## 2024-02-12 DIAGNOSIS — Z7401 Bed confinement status: Secondary | ICD-10-CM | POA: Diagnosis not present

## 2024-02-12 DIAGNOSIS — M47812 Spondylosis without myelopathy or radiculopathy, cervical region: Secondary | ICD-10-CM | POA: Diagnosis not present

## 2024-02-12 DIAGNOSIS — Z5181 Encounter for therapeutic drug level monitoring: Secondary | ICD-10-CM | POA: Diagnosis not present

## 2024-02-12 DIAGNOSIS — S32591A Other specified fracture of right pubis, initial encounter for closed fracture: Secondary | ICD-10-CM | POA: Diagnosis not present

## 2024-02-12 DIAGNOSIS — R42 Dizziness and giddiness: Secondary | ICD-10-CM | POA: Diagnosis not present

## 2024-02-12 DIAGNOSIS — Z9181 History of falling: Secondary | ICD-10-CM | POA: Diagnosis not present

## 2024-02-12 DIAGNOSIS — R262 Difficulty in walking, not elsewhere classified: Secondary | ICD-10-CM | POA: Diagnosis not present

## 2024-02-12 DIAGNOSIS — R000 Tachycardia, unspecified: Secondary | ICD-10-CM | POA: Diagnosis not present

## 2024-02-12 NOTE — ED Triage Notes (Signed)
 Lower back pain from a mechanical fall this afternoon. Didn't hit his head. No LOC. Takes eliquis .   112/78 86 16

## 2024-02-12 NOTE — ED Notes (Signed)
Warm blankets provided. Call light within reach.

## 2024-02-12 NOTE — ED Provider Notes (Signed)
 [place] EMERGENCY DEPARTMENT AT [facility] Provider Note   CSN: [id number] Arrival date & time: 02/12/24  2322     Patient presents with: Fall   [name] is a 83 y.o. male.  {Add pertinent medical, surgical, social history, OB history to YEP:67052} Patient with a history of atrial fibrillation on Eliquis , CAD with stents, hypertension, CHF presents with back and groin pain after fall.  States he slipped on some socks on the floor about 1 PM landing on his bilateral knees and low back.  Did not hit head.  Complains of pain in the low back and bilateral knees.  No loss of consciousness.  Difficulty with ambulation since.  Sustained skin tear to right elbow pain to bilateral hips and bilateral knees and low back.  Denies head or neck pain.  Denies chest pain or abdominal pain.  No upper back pain.  Does not think he hit his head.  No fever.  No syncope  The history is provided by the patient, a relative and the EMS personnel.  Fall Pertinent negatives include no chest pain, no abdominal pain, no headaches and no shortness of breath.       Prior to Admission medications   Medication Sig Start Date End Date Taking? Authorizing Provider  acetaminophen  (TYLENOL ) 325 MG tablet Take 2 tablets (650 mg total) by mouth every 6 (six) hours as needed for moderate pain. 03/17/22   Christopher Savannah, PA-C  apixaban  (ELIQUIS ) 2.5 MG TABS tablet Take 1 tablet (2.5 mg total) by mouth 2 (two) times daily. 06/04/23   Cindie Ole DASEN, MD  atorvastatin  (LIPITOR) 20 MG tablet TAKE 1 TABLET(20 MG) BY MOUTH DAILY 06/04/23   Shlomo Wilbert SAUNDERS, MD  Boswellia Serrata (BOSWELLIA PO) Take 1,200 mg by mouth daily.    [provider]  carvedilol  (COREG ) 6.25 MG tablet Take 1 tablet (6.25 mg total) by mouth 2 (two) times daily. 10/20/23   Cindie Ole DASEN, MD  furosemide  (LASIX ) 20 MG tablet Take 40 mg by mouth 2 (two) times daily.    [provider]  lidocaine  (LIDODERM ) 5 % Place 1  patch onto the skin daily.    [provider]  Misc Natural Products (GLUCOSAMINE CHONDROITIN TRIPLE) TABS Take 1 tablet by mouth daily.    [provider]  Multiple Vitamin (MULTIVITAMIN WITH MINERALS) TABS tablet Take 1 tablet by mouth daily.    [provider]  Omega-3 Fatty Acids (FISH OIL) 1200 MG CAPS Take 1,200 mg by mouth daily.    [provider]    Allergies: Patient has no known allergies.    Review of Systems  Constitutional:  Negative for activity change, appetite change and fever.  HENT:  Negative for congestion and nosebleeds.   Respiratory:  Negative for cough, chest tightness and shortness of breath.   Cardiovascular:  Negative for chest pain.  Gastrointestinal:  Negative for abdominal pain, nausea and vomiting.  Genitourinary:  Negative for dysuria and hematuria.  Musculoskeletal:  Positive for arthralgias, back pain and myalgias.  Skin:  Positive for wound.  Neurological:  Negative for dizziness, weakness and headaches.    all other systems are negative except as noted in the HPI and PMH.   Updated Vital Signs BP 124/82 (BP Location: Right Arm)   Pulse 98   Temp 97.7 F (36.5 C)   Resp 18   Ht 6' 2 (1.88 m)   Wt 69 kg   SpO2 98%   BMI 19.53 kg/m  Physical Exam Vitals and nursing note reviewed.  Constitutional:      General: He is not in acute distress.    Appearance: He is well-developed.  HENT:     Head: Normocephalic and atraumatic.     Mouth/Throat:     Pharynx: No oropharyngeal exudate.  Eyes:     Conjunctiva/sclera: Conjunctivae normal.     Pupils: Pupils are equal, round, and reactive to light.  Neck:     Comments: No midline C-spine pain Cardiovascular:     Rate and Rhythm: Normal rate and regular rhythm.     Heart sounds: Normal heart sounds. No murmur heard. Pulmonary:     Effort: Pulmonary effort is normal. No respiratory distress.     Breath sounds: Normal breath sounds.  Abdominal:      Palpations: Abdomen is soft.     Tenderness: There is no abdominal tenderness. There is no guarding or rebound.  Musculoskeletal:        General: Tenderness present. Normal range of motion.     Cervical back: Normal range of motion and neck supple.     Comments: Pain with range of motion of bilateral hips without deformity. Tender lumbar spine without step-offs. Skin tear right elbow without bony deformity. Abrasion R knee. No bony deformity.  Skin:    General: Skin is warm.  Neurological:     Mental Status: He is alert and oriented to person, place, and time.     Cranial Nerves: No cranial nerve deficit.     Motor: No abnormal muscle tone.     Coordination: Coordination normal.     Comments:  5/5 strength throughout. CN 2-12 intact.Equal grip strength.   Psychiatric:        Behavior: Behavior normal.     (all labs ordered are listed, but only abnormal results are displayed) Labs Reviewed  CBC WITH DIFFERENTIAL/PLATELET  COMPREHENSIVE METABOLIC PANEL WITH GFR    EKG: None  Radiology: No results found.  {Document cardiac monitor, telemetry assessment procedure when appropriate:32947} Procedures   Medications Ordered in the ED - No data to display    {Click here for ABCD2, HEART and other calculators REFRESH Note before signing:1}                              Medical Decision Making Amount and/or Complexity of Data Reviewed Independent Historian: caregiver and EMS Labs: ordered. Decision-making details documented in ED Course. Radiology: ordered and independent interpretation performed. Decision-making details documented in ED Course. ECG/medicine tests: ordered and independent interpretation performed. Decision-making details documented in ED Course.   Mechanical fall with bilateral hip and back pain.  Does take Eliquis .  Denies head trauma.  No syncope.  Sustained skin tear to right elbow as well as abrasion to right knee.  {Document critical care time when  appropriate  Document review of labs and clinical decision tools ie CHADS2VASC2, etc  Document your independent review of radiology images and any outside records  Document your discussion with family members, caretakers and with consultants  Document social determinants of health affecting pt's care  Document your decision making why or why not admission, treatments were needed:32947:::1}   Final diagnoses:  None    ED Discharge Orders     None

## 2024-02-13 ENCOUNTER — Emergency Department (HOSPITAL_COMMUNITY)

## 2024-02-13 ENCOUNTER — Ambulatory Visit: Payer: Self-pay

## 2024-02-13 ENCOUNTER — Other Ambulatory Visit: Payer: Self-pay

## 2024-02-13 DIAGNOSIS — I110 Hypertensive heart disease with heart failure: Secondary | ICD-10-CM | POA: Diagnosis present

## 2024-02-13 DIAGNOSIS — M4802 Spinal stenosis, cervical region: Secondary | ICD-10-CM | POA: Diagnosis not present

## 2024-02-13 DIAGNOSIS — W010XXA Fall on same level from slipping, tripping and stumbling without subsequent striking against object, initial encounter: Secondary | ICD-10-CM | POA: Diagnosis present

## 2024-02-13 DIAGNOSIS — S0990XA Unspecified injury of head, initial encounter: Secondary | ICD-10-CM | POA: Diagnosis not present

## 2024-02-13 DIAGNOSIS — R0602 Shortness of breath: Secondary | ICD-10-CM | POA: Diagnosis not present

## 2024-02-13 DIAGNOSIS — R4181 Age-related cognitive decline: Secondary | ICD-10-CM | POA: Diagnosis present

## 2024-02-13 DIAGNOSIS — T45515A Adverse effect of anticoagulants, initial encounter: Secondary | ICD-10-CM | POA: Diagnosis present

## 2024-02-13 DIAGNOSIS — Z7901 Long term (current) use of anticoagulants: Secondary | ICD-10-CM | POA: Diagnosis not present

## 2024-02-13 DIAGNOSIS — S3289XA Fracture of other parts of pelvis, initial encounter for closed fracture: Secondary | ICD-10-CM | POA: Diagnosis not present

## 2024-02-13 DIAGNOSIS — Z79899 Other long term (current) drug therapy: Secondary | ICD-10-CM | POA: Diagnosis not present

## 2024-02-13 DIAGNOSIS — D6832 Hemorrhagic disorder due to extrinsic circulating anticoagulants: Secondary | ICD-10-CM | POA: Diagnosis present

## 2024-02-13 DIAGNOSIS — Z96652 Presence of left artificial knee joint: Secondary | ICD-10-CM | POA: Diagnosis present

## 2024-02-13 DIAGNOSIS — I495 Sick sinus syndrome: Secondary | ICD-10-CM | POA: Diagnosis present

## 2024-02-13 DIAGNOSIS — R339 Retention of urine, unspecified: Secondary | ICD-10-CM | POA: Diagnosis present

## 2024-02-13 DIAGNOSIS — M47812 Spondylosis without myelopathy or radiculopathy, cervical region: Secondary | ICD-10-CM | POA: Diagnosis not present

## 2024-02-13 DIAGNOSIS — I480 Paroxysmal atrial fibrillation: Secondary | ICD-10-CM | POA: Diagnosis not present

## 2024-02-13 DIAGNOSIS — I10 Essential (primary) hypertension: Secondary | ICD-10-CM | POA: Diagnosis not present

## 2024-02-13 DIAGNOSIS — M549 Dorsalgia, unspecified: Secondary | ICD-10-CM | POA: Diagnosis present

## 2024-02-13 DIAGNOSIS — R000 Tachycardia, unspecified: Secondary | ICD-10-CM | POA: Diagnosis not present

## 2024-02-13 DIAGNOSIS — S32511A Fracture of superior rim of right pubis, initial encounter for closed fracture: Secondary | ICD-10-CM | POA: Diagnosis not present

## 2024-02-13 DIAGNOSIS — I4819 Other persistent atrial fibrillation: Secondary | ICD-10-CM | POA: Diagnosis present

## 2024-02-13 DIAGNOSIS — E785 Hyperlipidemia, unspecified: Secondary | ICD-10-CM | POA: Diagnosis present

## 2024-02-13 DIAGNOSIS — Y92009 Unspecified place in unspecified non-institutional (private) residence as the place of occurrence of the external cause: Secondary | ICD-10-CM | POA: Diagnosis not present

## 2024-02-13 DIAGNOSIS — S199XXA Unspecified injury of neck, initial encounter: Secondary | ICD-10-CM | POA: Diagnosis not present

## 2024-02-13 DIAGNOSIS — Z5181 Encounter for therapeutic drug level monitoring: Secondary | ICD-10-CM | POA: Diagnosis not present

## 2024-02-13 DIAGNOSIS — I2510 Atherosclerotic heart disease of native coronary artery without angina pectoris: Secondary | ICD-10-CM | POA: Diagnosis present

## 2024-02-13 DIAGNOSIS — W19XXXA Unspecified fall, initial encounter: Secondary | ICD-10-CM | POA: Diagnosis present

## 2024-02-13 DIAGNOSIS — M545 Low back pain, unspecified: Secondary | ICD-10-CM | POA: Diagnosis not present

## 2024-02-13 DIAGNOSIS — Z955 Presence of coronary angioplasty implant and graft: Secondary | ICD-10-CM | POA: Diagnosis not present

## 2024-02-13 DIAGNOSIS — D649 Anemia, unspecified: Secondary | ICD-10-CM | POA: Diagnosis not present

## 2024-02-13 DIAGNOSIS — S32592A Other specified fracture of left pubis, initial encounter for closed fracture: Secondary | ICD-10-CM | POA: Diagnosis present

## 2024-02-13 DIAGNOSIS — S3210XA Unspecified fracture of sacrum, initial encounter for closed fracture: Secondary | ICD-10-CM | POA: Diagnosis not present

## 2024-02-13 DIAGNOSIS — R918 Other nonspecific abnormal finding of lung field: Secondary | ICD-10-CM | POA: Diagnosis not present

## 2024-02-13 DIAGNOSIS — I672 Cerebral atherosclerosis: Secondary | ICD-10-CM | POA: Diagnosis not present

## 2024-02-13 DIAGNOSIS — Z8249 Family history of ischemic heart disease and other diseases of the circulatory system: Secondary | ICD-10-CM | POA: Diagnosis not present

## 2024-02-13 DIAGNOSIS — Z96643 Presence of artificial hip joint, bilateral: Secondary | ICD-10-CM | POA: Diagnosis not present

## 2024-02-13 DIAGNOSIS — S32512A Fracture of superior rim of left pubis, initial encounter for closed fracture: Secondary | ICD-10-CM | POA: Diagnosis not present

## 2024-02-13 DIAGNOSIS — S3282XA Multiple fractures of pelvis without disruption of pelvic ring, initial encounter for closed fracture: Secondary | ICD-10-CM | POA: Diagnosis present

## 2024-02-13 DIAGNOSIS — D62 Acute posthemorrhagic anemia: Secondary | ICD-10-CM | POA: Diagnosis present

## 2024-02-13 DIAGNOSIS — J90 Pleural effusion, not elsewhere classified: Secondary | ICD-10-CM | POA: Diagnosis not present

## 2024-02-13 DIAGNOSIS — I700 Atherosclerosis of aorta: Secondary | ICD-10-CM | POA: Diagnosis not present

## 2024-02-13 DIAGNOSIS — G9341 Metabolic encephalopathy: Secondary | ICD-10-CM | POA: Diagnosis not present

## 2024-02-13 DIAGNOSIS — I5032 Chronic diastolic (congestive) heart failure: Secondary | ICD-10-CM | POA: Diagnosis present

## 2024-02-13 DIAGNOSIS — Z96641 Presence of right artificial hip joint: Secondary | ICD-10-CM | POA: Diagnosis present

## 2024-02-13 LAB — CBC
HCT: 27.1 % — ABNORMAL LOW (ref 39.0–52.0)
Hemoglobin: 8.3 g/dL — ABNORMAL LOW (ref 13.0–17.0)
MCH: 25.6 pg — ABNORMAL LOW (ref 26.0–34.0)
MCHC: 30.6 g/dL (ref 30.0–36.0)
MCV: 83.6 fL (ref 80.0–100.0)
Platelets: 160 K/uL (ref 150–400)
RBC: 3.24 MIL/uL — ABNORMAL LOW (ref 4.22–5.81)
RDW: 17 % — ABNORMAL HIGH (ref 11.5–15.5)
WBC: 7.7 K/uL (ref 4.0–10.5)
nRBC: 0.6 % — ABNORMAL HIGH (ref 0.0–0.2)

## 2024-02-13 LAB — CBC WITH DIFFERENTIAL/PLATELET
Abs Immature Granulocytes: 0.05 K/uL (ref 0.00–0.07)
Basophils Absolute: 0 K/uL (ref 0.0–0.1)
Basophils Relative: 0 %
Eosinophils Absolute: 0 K/uL (ref 0.0–0.5)
Eosinophils Relative: 0 %
HCT: 21.6 % — ABNORMAL LOW (ref 39.0–52.0)
Hemoglobin: 6.3 g/dL — CL (ref 13.0–17.0)
Immature Granulocytes: 1 %
Lymphocytes Relative: 5 %
Lymphs Abs: 0.5 K/uL — ABNORMAL LOW (ref 0.7–4.0)
MCH: 24 pg — ABNORMAL LOW (ref 26.0–34.0)
MCHC: 29.2 g/dL — ABNORMAL LOW (ref 30.0–36.0)
MCV: 82.4 fL (ref 80.0–100.0)
Monocytes Absolute: 0.6 K/uL (ref 0.1–1.0)
Monocytes Relative: 6 %
Neutro Abs: 9.2 K/uL — ABNORMAL HIGH (ref 1.7–7.7)
Neutrophils Relative %: 88 %
Platelets: 183 K/uL (ref 150–400)
RBC: 2.62 MIL/uL — ABNORMAL LOW (ref 4.22–5.81)
RDW: 17.7 % — ABNORMAL HIGH (ref 11.5–15.5)
WBC: 10.3 K/uL (ref 4.0–10.5)
nRBC: 0.5 % — ABNORMAL HIGH (ref 0.0–0.2)

## 2024-02-13 LAB — COMPREHENSIVE METABOLIC PANEL WITH GFR
ALT: 29 U/L (ref 0–44)
AST: 39 U/L (ref 15–41)
Albumin: 3 g/dL — ABNORMAL LOW (ref 3.5–5.0)
Alkaline Phosphatase: 46 U/L (ref 38–126)
Anion gap: 10 (ref 5–15)
BUN: 20 mg/dL (ref 8–23)
CO2: 22 mmol/L (ref 22–32)
Calcium: 8 mg/dL — ABNORMAL LOW (ref 8.9–10.3)
Chloride: 108 mmol/L (ref 98–111)
Creatinine, Ser: 1.1 mg/dL (ref 0.61–1.24)
GFR, Estimated: >60 mL/min (ref >60)
Glucose, Bld: 112 mg/dL — ABNORMAL HIGH (ref 70–99)
Potassium: 3.7 mmol/L (ref 3.5–5.1)
Sodium: 140 mmol/L (ref 135–145)
Total Bilirubin: 0.8 mg/dL (ref 0.0–1.2)
Total Protein: 5.2 g/dL — ABNORMAL LOW (ref 6.5–8.1)

## 2024-02-13 LAB — TROPONIN I (HIGH SENSITIVITY)
Troponin I (High Sensitivity): 19 ng/L — ABNORMAL HIGH (ref <18)
Troponin I (High Sensitivity): 21 ng/L — ABNORMAL HIGH (ref <18)

## 2024-02-13 LAB — RETICULOCYTES
Immature Retic Fract: 25 % — ABNORMAL HIGH (ref 2.3–15.9)
RBC.: 2.73 MIL/uL — ABNORMAL LOW (ref 4.22–5.81)
Retic Count, Absolute: 46.5 K/uL (ref 19.0–186.0)
Retic Ct Pct: 1.8 % (ref 0.4–3.1)

## 2024-02-13 LAB — POC OCCULT BLOOD, ED: Fecal Occult Bld: NEGATIVE

## 2024-02-13 LAB — IRON AND TIBC
Iron: 34 ug/dL — ABNORMAL LOW (ref 45–182)
Saturation Ratios: 9 % — ABNORMAL LOW (ref 17.9–39.5)
TIBC: 371 ug/dL (ref 250–450)
UIBC: 337 ug/dL

## 2024-02-13 LAB — FERRITIN: Ferritin: 13 ng/mL — ABNORMAL LOW (ref 24–336)

## 2024-02-13 LAB — PREPARE RBC (CROSSMATCH)

## 2024-02-13 LAB — FOLATE: Folate: 15.4 ng/mL (ref >5.9)

## 2024-02-13 LAB — VITAMIN B12: Vitamin B-12: 119 pg/mL — ABNORMAL LOW (ref 180–914)

## 2024-02-13 MED ORDER — FENTANYL CITRATE PF 50 MCG/ML IJ SOSY: 50.0000 ug | PREFILLED_SYRINGE | Freq: Once | INTRAMUSCULAR | Status: DC

## 2024-02-13 MED ORDER — SODIUM CHLORIDE 0.9 % IV SOLN: INTRAVENOUS | Status: AC

## 2024-02-13 MED ORDER — ACETAMINOPHEN 650 MG RE SUPP: 975.0000 mg | Freq: Three times a day (TID) | RECTAL | Status: DC

## 2024-02-13 MED ORDER — PROCHLORPERAZINE EDISYLATE 10 MG/2ML IJ SOLN: 5.0000 mg | Freq: Four times a day (QID) | INTRAMUSCULAR | Status: DC | PRN

## 2024-02-13 MED ORDER — SODIUM CHLORIDE 0.9% IV SOLUTION: Freq: Once | INTRAVENOUS | Status: AC

## 2024-02-13 MED ORDER — ACETAMINOPHEN 500 MG PO TABS
1000.0000 mg | ORAL_TABLET | Freq: Three times a day (TID) | ORAL | Status: DC
  Administered 2024-02-13 – 2024-02-20 (×23): 1000 mg via ORAL
  Filled 2024-02-13 (×23): qty 2

## 2024-02-13 MED ORDER — CARVEDILOL 6.25 MG PO TABS
6.2500 mg | ORAL_TABLET | Freq: Two times a day (BID) | ORAL | Status: DC
  Administered 2024-02-13 – 2024-02-20 (×15): 6.25 mg via ORAL
  Filled 2024-02-13 (×11): qty 1
  Filled 2024-02-13: qty 2
  Filled 2024-02-13 (×3): qty 1

## 2024-02-13 MED ORDER — ADULT MULTIVITAMIN W/MINERALS CH
1.0000 | ORAL_TABLET | Freq: Every day | ORAL | Status: DC
Start: 2024-02-13 — End: 2024-02-20
  Administered 2024-02-13 – 2024-02-20 (×8): 1 via ORAL
  Filled 2024-02-13 (×8): qty 1

## 2024-02-13 MED ORDER — ACETAMINOPHEN 325 MG PO TABS: 650.0000 mg | ORAL_TABLET | Freq: Four times a day (QID) | ORAL | Status: DC | PRN

## 2024-02-13 MED ORDER — ACETAMINOPHEN 160 MG/5ML PO SOLN: 1000.0000 mg | Freq: Three times a day (TID) | ORAL | Status: DC

## 2024-02-13 MED ORDER — OXYCODONE HCL 5 MG PO TABS
2.5000 mg | ORAL_TABLET | ORAL | Status: DC | PRN
  Administered 2024-02-13 – 2024-02-14 (×3): 5 mg via ORAL
  Filled 2024-02-13 (×3): qty 1

## 2024-02-13 MED ORDER — MORPHINE SULFATE (PF) 2 MG/ML IV SOLN: 1.0000 mg | INTRAVENOUS | Status: DC | PRN

## 2024-02-13 MED ORDER — AMIODARONE HCL 200 MG PO TABS
200.0000 mg | ORAL_TABLET | Freq: Every day | ORAL | Status: DC
  Administered 2024-02-13 – 2024-02-20 (×8): 200 mg via ORAL
  Filled 2024-02-13 (×8): qty 1

## 2024-02-13 MED ORDER — CHLORHEXIDINE GLUCONATE CLOTH 2 % EX PADS
6.0000 | MEDICATED_PAD | Freq: Every day | CUTANEOUS | Status: DC
  Administered 2024-02-13 – 2024-02-20 (×8): 6 via TOPICAL

## 2024-02-13 MED ORDER — IOHEXOL 350 MG/ML SOLN
75.0000 mL | Freq: Once | INTRAVENOUS | Status: AC | PRN
  Administered 2024-02-13: 75 mL via INTRAVENOUS

## 2024-02-13 MED ORDER — ATORVASTATIN CALCIUM 10 MG PO TABS
20.0000 mg | ORAL_TABLET | Freq: Every day | ORAL | Status: DC
  Administered 2024-02-13 – 2024-02-20 (×8): 20 mg via ORAL
  Filled 2024-02-13 (×9): qty 2

## 2024-02-13 MED ORDER — SODIUM CHLORIDE 0.9% IV SOLUTION: Freq: Once | INTRAVENOUS | Status: DC

## 2024-02-13 MED ORDER — POLYETHYLENE GLYCOL 3350 17 G PO PACK
17.0000 g | PACK | Freq: Every day | ORAL | Status: DC | PRN
  Administered 2024-02-14 – 2024-02-17 (×2): 17 g via ORAL
  Filled 2024-02-13 (×3): qty 1

## 2024-02-13 NOTE — ED Notes (Signed)
 CCMD notified. Pt is on monitor.

## 2024-02-13 NOTE — ED Notes (Signed)
 Attempted IV x 2 without success. Another RN To try.

## 2024-02-13 NOTE — TOC CAGE-AID Note (Signed)
 Transition of Care [facility]) - CAGE-AID Screening   Patient Details  Name: [name] MRN: [id number] Date of Birth: [birth year]  [facility] CM/SW Contact:    LEBRON ROCKIE ORN, RN Phone Number: [phone] 02/13/2024, 5:50 PM   Clinical Narrative: Pt is currently in the hospital due to having a fall and sustaining pelvic fxs.  Pt denies alcohol or drug use.  Screening complete.   CAGE-AID Screening:    Have You Ever Felt You Ought to Cut Down on Your Drinking or Drug Use?: No Have People Annoyed You By Critizing Your Drinking Or Drug Use?: No Have You Felt Bad Or Guilty About Your Drinking Or Drug Use?: No Have You Ever Had a Drink or Used Drugs First Thing In The Morning to Steady Your Nerves or to Get Rid of a Hangover?: No CAGE-AID Score: 0  Substance Abuse Education Offered: No

## 2024-02-13 NOTE — ED Notes (Signed)
 Darice David : [phone] Lorrene: [phone] Call with updates.

## 2024-02-13 NOTE — ED Notes (Signed)
 This RN at bedside with the pt. 2nd transfusion started. Pt educated on possible reactions signs/symptoms to report. Pt agrees and understands. Pt resting and denies any concerns at this time.

## 2024-02-13 NOTE — ED Notes (Signed)
 15 mins post blood transfusion start completed. Pt states that he feels fine and has no concerns. No acute distress. Denies any pain.

## 2024-02-13 NOTE — Care Plan (Signed)
 Orthopaedic Surgery Plan of Care Note   -history and imaging reviewed with primary team (ER) -pt has lateral compression pelvic ring injury (left sup & inf pubic rami, right sup pubic rami, chronic appearing right anterior sacral ala fractures) -PMH includes atrial fibrillation on Eliquis , CAD with stents, hypertension, CHF -admit to hospitalist team -WBAT with walker and 1-2 person assist -PT/OT -please obtain post mobilization pelvis AP/inlet/outlet after patient has been up with PT -full consult note to follow   Lillia Mountain, MD Orthopaedic Surgery [facility]

## 2024-02-13 NOTE — ED Notes (Signed)
 Multiple RN s attempted IV access without any luck. IV team consulted.

## 2024-02-13 NOTE — Plan of Care (Signed)

## 2024-02-13 NOTE — ED Notes (Signed)
 Vital signs stable.

## 2024-02-13 NOTE — Consult Note (Signed)
 Albion Weatherholtz Germani 03/28/1941  [id number].    Requesting MD: Garnette Halo, MD Chief Complaint/Reason for Consult: trauma, pelvic fractures  HPI:  Mr. Comunale is an 83 yo male who presented to the ED after a fall at home. He reports he was in the kitchen and tripped. He denies hitting his head and did not have loss of consciousness. He did not bleeding from a wound on his right leg after the fall. The incident occurred around 5pm yesterday evening. He has been stable since arrival to the ED. Imaging workup showed bilateral pubic ramus fractures with pelvic hematoma. He is on Eliquis  at home for a-fib, and has chronic anemia (baseline hgb 8-9). Hgb today is 6.3, and he is currently being transfused PRBCs.   ROS: Review of Systems  Constitutional:  Negative for chills and fever.  Eyes:  Negative for pain.  Respiratory:  Negative for shortness of breath.   Cardiovascular:  Negative for chest pain.  Gastrointestinal:  Negative for abdominal pain.  Musculoskeletal:  Positive for falls.       Leg pain  Neurological:  Negative for loss of consciousness.    Family History  Problem Relation Age of Onset   Coronary artery disease Other     Past Medical History:  Diagnosis Date   Ascending aorta dilatation (HCC)    44mm by chest CT angio 04/2018   CAD (coronary artery disease)    a. 05/2006 Abnl stress test w/ 2-25mm ST dep; b. 05/2006 Cath/PCI: LM nl, LAD 30-40ost, 20-30p, 90d (small), D1 nl, D2 nl, LCX nl, OM1/2 nl, RCA Ca2+, 30m/d (3.5x16 Liberty BMS & 3.5x12 Liberty BMS), EF 50%.   Chronic diastolic CHF (congestive heart failure) (HCC)    a. 10/2017 Echo: EF 50-55%, mild AI, sev dil LA, mod dil RA.   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 CHA2DS2VASc 5-->Eliquis ; b. Prev WCT on flecainide;  c. 12/2017 s/p DCCV.   Pneumothorax    Prolonged Q-T interval on ECG 06/22/2022    Ventricular tachycardia (HCC)    a. possibly proarrhythmia from flecainide.    Past Surgical History:  Procedure Laterality Date   CARDIOVERSION N/A 12/23/2017   Procedure: CARDIOVERSION;  Surgeon: Shlomo Wilbert SAUNDERS, MD;  Location: [facility] ENDOSCOPY;  Service: Cardiovascular;  Laterality: N/A;   CARDIOVERSION N/A 03/08/2018   Procedure: CARDIOVERSION;  Surgeon: Shlomo Wilbert SAUNDERS, MD;  Location: [facility] ENDOSCOPY;  Service: Cardiovascular;  Laterality: N/A;   INGUINAL HERNIA REPAIR     x2   left total hip surgery     TEE WITHOUT CARDIOVERSION N/A 03/08/2018   Procedure: TRANSESOPHAGEAL ECHOCARDIOGRAM (TEE);  Surgeon: Shlomo Wilbert SAUNDERS, MD;  Location: [facility] ENDOSCOPY;  Service: Cardiovascular;  Laterality: N/A;   TOTAL HIP ARTHROPLASTY Right 08/19/2020   Procedure: TOTAL HIP ARTHROPLASTY ANTERIOR APPROACH;  Surgeon: Melodi Lerner, MD;  Location: [facility] ORS;  Service: Orthopedics;  Laterality: Right;    TOTAL KNEE ARTHROPLASTY Left 02/28/2018   Procedure: LEFT TOTAL KNEE ARTHROPLASTY;  Surgeon: Melodi Lerner, MD;  Location: [facility] ORS;  Service: Orthopedics;  Laterality: Left;   VEIN LIGATION AND STRIPPING Left 04/08/2020   Procedure: LEFT LOWER EXTREMITY VEIN LIGATION AND EXCISION OF ULCER;  Surgeon: Eliza Lonni RAMAN, MD;  Location: [facility] OR;  Service: Vascular;  Laterality: Left;    Social History:  reports that he has never smoked. He has never used smokeless tobacco. He reports that  he does not currently use alcohol. He reports that he does not currently use drugs after having used the following drugs: Other-see comments.  Allergies: No Known Allergies  (Not in a hospital admission)    Physical Exam: Blood pressure (!) 150/98, pulse 97, temperature (!) 97.5 F (36.4 C), temperature source Oral, resp. rate 17, height 6' 2 (1.88 m), weight 69 kg, SpO2 95%. General: resting comfortably, no apparent distress Neurological: alert and oriented, no focal deficits, cranial nerves grossly in tact HEENT:  normocephalic, atraumatic, no facial lacerations or facial swelling. No cervical spinal tenderness to palpation. EOM in tact. CV: regular rate and rhythm, extremities warm and well-perfused Respiratory: normal work of breathing on nasal cannula, symmetric chest wall expansion Abdomen: soft, nondistended, mild tenderness to palpation in lower abdomen. No abdominal ecchymoses. Extremities: warm and well-perfused, moving all extremities spontaneously. Superficial skin tear on the right lower leg, hemostatic. Mild bilateral lower extremity edema. Psychiatric: normal mood and affect Skin: warm and dry, no jaundice   Results for orders placed or performed during the hospital encounter of 02/12/24 (from the past 48 hours)  CBC with Differential     Status: Abnormal   Collection Time: 02/13/24  2:13 AM  Result Value Ref Range   WBC 10.3 4.0 - 10.5 K/uL   RBC 2.62 (L) 4.22 - 5.81 MIL/uL   Hemoglobin 6.3 (LL) 13.0 - 17.0 g/dL    Comment: REPEATED TO VERIFY THIS CRITICAL RESULT HAS VERIFIED AND BEEN CALLED TO J. FERRAINOL RN BY MARY ALAMANO ON 07 27 2025 AT 0251, AND HAS BEEN READ BACK.     HCT 21.6 (L) 39.0 - 52.0 %   MCV 82.4 80.0 - 100.0 fL   MCH 24.0 (L) 26.0 - 34.0 pg   MCHC 29.2 (L) 30.0 - 36.0 g/dL   RDW 82.2 (H) 88.4 - 84.4 %   Platelets 183 150 - 400 K/uL   nRBC 0.5 (H) 0.0 - 0.2 %   Neutrophils Relative % 88 %   Neutro Abs 9.2 (H) 1.7 - 7.7 K/uL   Lymphocytes Relative 5 %   Lymphs Abs 0.5 (L) 0.7 - 4.0 K/uL   Monocytes Relative 6 %   Monocytes Absolute 0.6 0.1 - 1.0 K/uL   Eosinophils Relative 0 %   Eosinophils Absolute 0.0 0.0 - 0.5 K/uL   Basophils Relative 0 %   Basophils Absolute 0.0 0.0 - 0.1 K/uL   Immature Granulocytes 1 %   Abs Immature Granulocytes 0.05 0.00 - 0.07 K/uL    Comment: Performed at [facility], [address] [place], [place] [address]  Comprehensive metabolic panel     Status: Abnormal   Collection Time: 02/13/24  2:13 AM  Result Value Ref Range    Sodium 140 135 - 145 mmol/L   Potassium 3.7 3.5 - 5.1 mmol/L   Chloride 108 98 - 111 mmol/L   CO2 22 22 - 32 mmol/L   Glucose, Bld 112 (H) 70 - 99 mg/dL    Comment: Glucose reference range applies only to samples taken after fasting for at least 8 hours.   BUN 20 8 - 23 mg/dL   Creatinine, Ser 8.89 0.61 - 1.24 mg/dL   Calcium  8.0 (L) 8.9 - 10.3 mg/dL   Total Protein 5.2 (L) 6.5 - 8.1 g/dL   Albumin 3.0 (L) 3.5 - 5.0 g/dL   AST 39 15 - 41 U/L   ALT 29 0 - 44 U/L   Alkaline Phosphatase 46 38 - 126  U/L   Total Bilirubin 0.8 0.0 - 1.2 mg/dL   GFR, Estimated >39 >39 mL/min    Comment: (NOTE) Calculated using the CKD-EPI Creatinine Equation (2021)    Anion gap 10 5 - 15    Comment: Performed at [facility], [address] [place], [place] [address]  Troponin I (High Sensitivity)     Status: Abnormal   Collection Time: 02/13/24  2:13 AM  Result Value Ref Range   Troponin I (High Sensitivity) 19 (H) <18 ng/L    Comment: (NOTE) Elevated high sensitivity troponin I (hsTnI) values and significant  changes across serial measurements may suggest ACS but many other  chronic and acute conditions are known to elevate hsTnI results.  Refer to the Links section for chest pain algorithms and additional  guidance. Performed at [facility], [address] [place], [place] [address]   Type and screen [facility]     Status: None (Preliminary result)   Collection Time: 02/13/24  3:15 AM  Result Value Ref Range   ABO/RH(D) O POS    Antibody Screen NEG    Sample Expiration      02/16/2024,2359 Performed at [facility], [address] [place], [place] [address]    Unit Number [id number]    Blood Component Type RED CELLS,LR    Unit division 00    Status of Unit ISSUED    Transfusion Status OK TO TRANSFUSE    Crossmatch Result Compatible    Unit Number [id number]    Blood Component Type RED CELLS,LR    Unit division 00    Status of Unit  ALLOCATED    Transfusion Status OK TO TRANSFUSE    Crossmatch Result Compatible   Prepare RBC (crossmatch)     Status: None   Collection Time: 02/13/24  3:15 AM  Result Value Ref Range   Order Confirmation      ORDER PROCESSED BY BLOOD BANK Performed at [facility], [address], [place], [place] [address]   Vitamin B12     Status: Abnormal   Collection Time: 02/13/24  3:30 AM  Result Value Ref Range   Vitamin B-12 119 (L) 180 - 914 pg/mL    Comment: (NOTE) This assay is not validated for testing neonatal or myeloproliferative syndrome specimens for Vitamin B12 levels. Performed at [facility], [address], [place], [place] [address]   Folate     Status: None   Collection Time: 02/13/24  3:30 AM  Result Value Ref Range   Folate 15.4 >5.9 ng/mL    Comment: Performed at [facility], [address] [place], [place] [address]  Reticulocytes     Status: Abnormal   Collection Time: 02/13/24  3:30 AM  Result Value Ref Range   Retic Ct Pct 1.8 0.4 - 3.1 %   RBC. 2.73 (L) 4.22 - 5.81 MIL/uL    Comment: RESULTS CONFIRMED BY MANUAL DILUTION   Retic Count, Absolute 46.5 19.0 - 186.0 K/uL   Immature Retic Fract 25.0 (H) 2.3 - 15.9 %    Comment: Performed at [facility], [address] [place], [place] [address]  POC occult blood, ED     Status: None   Collection Time: 02/13/24  4:00 AM  Result Value Ref Range   Fecal Occult Bld NEGATIVE NEGATIVE  Troponin I (High Sensitivity)     Status: Abnormal   Collection Time: 02/13/24  4:15 AM  Result Value Ref Range   Troponin I (High Sensitivity) 21 (H) <18 ng/L    Comment: (NOTE) Elevated high sensitivity troponin I (hsTnI) values and significant  changes across serial measurements may suggest ACS but many other  chronic and acute conditions are known to elevate hsTnI results.  Refer to the Links section for chest pain algorithms and additional  guidance. Performed at [facility], [address] [place], [place] [address]   Iron and TIBC     Status: Abnormal   Collection Time: 02/13/24  4:15 AM  Result Value Ref Range   Iron 34 (L) 45 - 182 ug/dL   TIBC 628 749 - 549 ug/dL   Saturation Ratios 9 (L) 17.9 - 39.5 %   UIBC 337 ug/dL    Comment: Performed at [facility], [address], [place], [place] [address]  Ferritin     Status: Abnormal   Collection Time: 02/13/24  4:15 AM  Result Value Ref Range   Ferritin 13 (L) 24 - 336 ng/mL    Comment: Performed at [facility], [address] [place], [place] [address]  Prepare RBC (crossmatch)     Status: None   Collection Time: 02/13/24  5:16 AM  Result Value Ref Range   Order Confirmation      ORDER PROCESSED BY BLOOD BANK Performed at [facility], [address] [place], [place] [address]    CT L-SPINE NO CHARGE Addendum Date: 02/13/2024 ADDENDUM REPORT: 02/13/2024 04:56 ADDENDUM: A transcription error resulted in replacement of the appropriate dictation for this examination. The findings and impression are as follows: Extensive multi-vessel coronary artery calcification. Mild global cardiomegaly. Small pericardial effusion. Central pulmonary arteries are enlarged in keeping with changes of pulmonary arterial hypertension. 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 Superimposed moderate atherosclerotic calcification of the thoracic aorta. Visualized thyroid  is unremarkable. No pathologic thoracic adenopathy. Esophagus is unremarkable. No mediastinal hematoma. No pneumomediastinum. Small bilateral pleural effusions are present, right greater than left. Mild bibasilar compressive atelectasis. No confluent pulmonary infiltrate. No pneumothorax. No central obstructing lesion. Thoracic dextroscoliosis noted. No acute bone abnormality within the thorax. Liver, gallbladder, pancreas, spleen, and left adrenal  glands are unremarkable. 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 Severe left renal cortical atrophy the right kidney is normal in size and position. Multiple simple and mildly complex cortical cysts are seen within the left kidney for which no follow-up imaging is recommended. No hydronephrosis. No intrarenal or ureteral calculi. Streak artifact limits evaluation of the bladder. The bladder, however, appears distended and is markedly thick walled suggesting superimposed diffuse infectious or inflammatory cystitis. Mild distal colonic and cecal diverticulosis. Stomach, small bowel, and large bowel otherwise unremarkable. No free intraperitoneal gas or fluid. Appendix normal. Mild aortoiliac atherosclerotic calcification. No aortic aneurysm. No pathologic adenopathy within the abdomen and pelvis. 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 There are acute fractures of the left superior and inferior pubic rami as well as the right superior pubic ramus. Mild override the left pubic rami fractures is in keeping with a lateral compression type injury. Subacute fracture of the right sacral ala with resorption along the fracture margin. There is infiltration within the space of Retzius surrounding the bladder in keeping with probable extraperitoneal  hemorrhage adjacent to the left superior pubic symphyseal fracture. No definite active extravasation identified though imaging is limited by streak artifact. Impression; Acute fractures of the right superior and left superior and inferior pubic rami with mild displacement suggested a lateral compression type injury. Adjacent extraperitoneal hemorrhage within the space of Retzius without definite active extravasation identified. Circumferential bladder wall thickening suggestive of a diffuse infectious or inflammatory cystitis. Additional incidental  findings as noted above. Electronically Signed   By: Dorethia Molt M.D.   On: 02/13/2024 04:56   Result Date: 02/13/2024 CLINICAL DATA:  Fall, low back pain EXAM: CT LUMBAR SPINE WITHOUT CONTRAST TECHNIQUE: Multidetector CT imaging of the lumbar spine was performed without intravenous contrast administration. Multiplanar CT image reconstructions were also generated. RADIATION DOSE REDUCTION: This exam was performed according to the departmental dose-optimization program which includes automated exposure control, adjustment of the mA and/or kV according to patient size and/or use of iterative reconstruction technique. COMPARISON:  None Available. 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 Alignment: Moderate lumbar levoscoliosis, apex left at L3. Normal lumbar lordosis. No listhesis. Vertebrae: No acute fracture of the lumbar spine; vertebral body height is preserved. No lytic or blastic bone lesion. Acute to subacute fracture of the right sacral ala noted with mild override of the fracture fragments suggestive of a lateral compression type injury. Osseous structures are diffusely osteopenic. Paraspinal and other soft tissues: See accompanying report for CT examination of the chest, abdomen, and pelvis. No paraspinal fluid collection or inflammatory change identified. Disc levels: Intervertebral disc space narrowing and endplate remodeling at L3-S1 is present in keeping with changes of advanced degenerative disc disease. Axial images demonstrate: L1-2: Unremarkable L2-3: Mild broad-based disc bulge and mild bilateral laminar hypertrophy result in mild central canal stenosis. No significant neuroforaminal narrowing. Mild bilateral facet arthrosis. L3-4: Posterior disc osteophyte complex. Mild bilateral facet arthrosis with associated hypertrophy. Resultant moderate central canal stenosis within the sub foraminal zone. No significant neuroforaminal narrowing. Impingement  the right lateral recess with possible impingement of the crossing right L4 nerve root L4-5: Broad disc osteophyte complex, advanced bilateral facet arthrosis, associated facet hypertrophy, and laminar hypertrophy result in severe central canal stenosis within the sub foraminal zone. Mild right and moderate left neuroforaminal narrowing. Impingement of the right lateral recess probable impingement of the crossing right L5 nerve root. Possible impingement the crossing left L5 nerve root. L5-S1: Bilateral facet arthrosis, severe on the left. No significant neuroforaminal narrowing or canal stenosis. IMPRESSION: 1. Acute to subacute fracture of the right sacral ala with mild override of the fracture fragments suggestive of a lateral compression type injury. 2. No acute fracture or malalignment of the lumbar spine. 3. 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 4. Impingement of the right lateral recesses at L3-4 and L4-5 with possible impingement of the crossing right L4 and L5 nerve roots. 5. Multilevel neuroforaminal narrowing, moderate on the left at L4-5. 6. 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 7. 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 Electronically Signed: By: Dorethia Molt M.D. On: 02/13/2024 04:26   CT T-SPINE NO CHARGE Addendum Date: 02/13/2024 ADDENDUM REPORT: 02/13/2024 04:56 ADDENDUM: A transcription error resulted in replacement of the appropriate dictation for this examination. The findings and impression are as follows: Extensive multi-vessel coronary artery calcification. Mild global cardiomegaly. Small pericardial effusion. Central pulmonary arteries are enlarged in keeping with changes of pulmonary  arterial hypertension. Fusiform dilation of the thoracic aorta measuring 4.3 cm in diameter in its ascending segment and 3.1 cm in diameter in its proximal descending  segment. Superimposed moderate atherosclerotic calcification of the thoracic aorta. Visualized thyroid  is unremarkable. No pathologic thoracic adenopathy. Esophagus is unremarkable. No mediastinal hematoma. No pneumomediastinum. Small bilateral pleural effusions are present, right greater than left. Mild bibasilar compressive atelectasis. No confluent pulmonary infiltrate. No pneumothorax. No central obstructing lesion. Thoracic dextroscoliosis noted. No acute bone abnormality within the thorax. Liver, gallbladder, pancreas, spleen, and left adrenal glands are unremarkable. 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 Severe left renal cortical atrophy the right kidney is normal in size and position. Multiple simple and mildly complex cortical cysts are seen within the left kidney for which no follow-up imaging is recommended. No hydronephrosis. No intrarenal or ureteral calculi. Streak artifact limits evaluation of the bladder. The bladder, however, appears distended and is markedly thick walled suggesting superimposed diffuse infectious or inflammatory cystitis. Mild distal colonic and cecal diverticulosis. Stomach, small bowel, and large bowel otherwise unremarkable. No free intraperitoneal gas or fluid. Appendix normal. Mild aortoiliac atherosclerotic calcification. No aortic aneurysm. No pathologic adenopathy within the abdomen and pelvis. 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 There are acute fractures of the left superior and inferior pubic rami as well as the right superior pubic ramus. Mild override the left pubic rami fractures is in keeping with a lateral compression type injury. Subacute fracture of the right sacral ala with resorption along the fracture margin. There is infiltration within the space of Retzius surrounding the bladder in keeping with probable extraperitoneal  hemorrhage adjacent to the left superior pubic symphyseal fracture. No definite active extravasation identified though imaging is limited by streak artifact. Impression; Acute fractures of the right superior and left superior and inferior pubic rami with mild displacement suggested a lateral compression type injury. Adjacent extraperitoneal hemorrhage within the space of Retzius without definite active extravasation identified. Circumferential bladder wall thickening suggestive of a diffuse infectious or inflammatory cystitis. Additional incidental findings as noted above. Electronically Signed   By: Dorethia Molt M.D.   On: 02/13/2024 04:56   Result Date: 02/13/2024 CLINICAL DATA:  Fall, low back pain EXAM: CT LUMBAR SPINE WITHOUT CONTRAST TECHNIQUE: Multidetector CT imaging of the lumbar spine was performed without intravenous contrast administration. Multiplanar CT image reconstructions were also generated. RADIATION DOSE REDUCTION: This exam was performed according to the departmental dose-optimization program which includes automated exposure control, adjustment of the mA and/or kV according to patient size and/or use of iterative reconstruction technique. COMPARISON:  None Available. 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 Alignment: Moderate lumbar levoscoliosis, apex left at L3. Normal lumbar lordosis. No listhesis. Vertebrae: No acute fracture of the lumbar spine; vertebral body height is preserved. No lytic or blastic bone lesion. Acute to subacute fracture of the right sacral ala noted with mild override of the fracture fragments suggestive of a lateral compression type injury. Osseous structures are diffusely osteopenic. Paraspinal and other soft tissues: See accompanying report for CT examination of the chest, abdomen, and pelvis. No paraspinal fluid collection or inflammatory change identified. Disc levels: Intervertebral disc space narrowing and  endplate remodeling at L3-S1 is present in keeping with changes of advanced degenerative disc disease. Axial images demonstrate: L1-2: Unremarkable L2-3: Mild broad-based disc bulge and mild bilateral laminar hypertrophy result in mild central  canal stenosis. No significant neuroforaminal narrowing. Mild bilateral facet arthrosis. L3-4: Posterior disc osteophyte complex. Mild bilateral facet arthrosis with associated hypertrophy. Resultant moderate central canal stenosis within the sub foraminal zone. No significant neuroforaminal narrowing. Impingement the right lateral recess with possible impingement of the crossing right L4 nerve root L4-5: Broad disc osteophyte complex, advanced bilateral facet arthrosis, associated facet hypertrophy, and laminar hypertrophy result in severe central canal stenosis within the sub foraminal zone. Mild right and moderate left neuroforaminal narrowing. Impingement of the right lateral recess probable impingement of the crossing right L5 nerve root. Possible impingement the crossing left L5 nerve root. L5-S1: Bilateral facet arthrosis, severe on the left. No significant neuroforaminal narrowing or canal stenosis. IMPRESSION: 1. Acute to subacute fracture of the right sacral ala with mild override of the fracture fragments suggestive of a lateral compression type injury. 2. No acute fracture or malalignment of the lumbar spine. 3. 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 4. Impingement of the right lateral recesses at L3-4 and L4-5 with possible impingement of the crossing right L4 and L5 nerve roots. 5. Multilevel neuroforaminal narrowing, moderate on the left at L4-5. 6. 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 7. 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 Electronically Signed: By: Dorethia Molt M.D. On: 02/13/2024 04:26   CT CHEST ABDOMEN PELVIS W  CONTRAST Addendum Date: 02/13/2024 ADDENDUM REPORT: 02/13/2024 04:56 ADDENDUM: A transcription error resulted in replacement of the appropriate dictation for this examination. The findings and impression are as follows: Extensive multi-vessel coronary artery calcification. Mild global cardiomegaly. Small pericardial effusion. Central pulmonary arteries are enlarged in keeping with changes of pulmonary arterial hypertension. 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 Superimposed moderate atherosclerotic calcification of the thoracic aorta. Visualized thyroid  is unremarkable. No pathologic thoracic adenopathy. Esophagus is unremarkable. No mediastinal hematoma. No pneumomediastinum. Small bilateral pleural effusions are present, right greater than left. Mild bibasilar compressive atelectasis. No confluent pulmonary infiltrate. No pneumothorax. No central obstructing lesion. Thoracic dextroscoliosis noted. No acute bone abnormality within the thorax. Liver, gallbladder, pancreas, spleen, and left adrenal glands are unremarkable. 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 Severe left renal cortical atrophy the right kidney is normal in size and position. Multiple simple and mildly complex cortical cysts are seen within the left kidney for which no follow-up imaging is recommended. No hydronephrosis. No intrarenal or ureteral calculi. Streak artifact limits evaluation of the bladder. The bladder, however, appears distended and is markedly thick walled suggesting superimposed diffuse infectious or inflammatory cystitis. Mild distal colonic and cecal diverticulosis. Stomach, small bowel, and large bowel otherwise unremarkable. No free intraperitoneal gas or fluid. Appendix normal. Mild aortoiliac  atherosclerotic calcification. No aortic aneurysm. No pathologic adenopathy within the abdomen and pelvis. 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 There are acute fractures of the left superior and inferior pubic rami as well as the right superior pubic ramus. Mild override the left pubic rami fractures is in keeping with a lateral compression type injury. Subacute fracture of the right sacral ala with resorption along the fracture margin. There is infiltration within the space of Retzius surrounding the bladder in keeping with probable extraperitoneal hemorrhage adjacent to the left superior pubic  symphyseal fracture. No definite active extravasation identified though imaging is limited by streak artifact. Impression; Acute fractures of the right superior and left superior and inferior pubic rami with mild displacement suggested a lateral compression type injury. Adjacent extraperitoneal hemorrhage within the space of Retzius without definite active extravasation identified. Circumferential bladder wall thickening suggestive of a diffuse infectious or inflammatory cystitis. Additional incidental findings as noted above. Electronically Signed   By: Dorethia Molt M.D.   On: 02/13/2024 04:56   Result Date: 02/13/2024 CLINICAL DATA:  Fall, low back pain EXAM: CT LUMBAR SPINE WITHOUT CONTRAST TECHNIQUE: Multidetector CT imaging of the lumbar spine was performed without intravenous contrast administration. Multiplanar CT image reconstructions were also generated. RADIATION DOSE REDUCTION: This exam was performed according to the departmental dose-optimization program which includes automated exposure control, adjustment of the mA and/or kV according to patient size and/or use of iterative reconstruction technique. COMPARISON:  None Available. 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 Alignment: Moderate lumbar levoscoliosis, apex left at L3. Normal lumbar  lordosis. No listhesis. Vertebrae: No acute fracture of the lumbar spine; vertebral body height is preserved. No lytic or blastic bone lesion. Acute to subacute fracture of the right sacral ala noted with mild override of the fracture fragments suggestive of a lateral compression type injury. Osseous structures are diffusely osteopenic. Paraspinal and other soft tissues: See accompanying report for CT examination of the chest, abdomen, and pelvis. No paraspinal fluid collection or inflammatory change identified. Disc levels: Intervertebral disc space narrowing and endplate remodeling at L3-S1 is present in keeping with changes of advanced degenerative disc disease. Axial images demonstrate: L1-2: Unremarkable L2-3: Mild broad-based disc bulge and mild bilateral laminar hypertrophy result in mild central canal stenosis. No significant neuroforaminal narrowing. Mild bilateral facet arthrosis. L3-4: Posterior disc osteophyte complex. Mild bilateral facet arthrosis with associated hypertrophy. Resultant moderate central canal stenosis within the sub foraminal zone. No significant neuroforaminal narrowing. Impingement the right lateral recess with possible impingement of the crossing right L4 nerve root L4-5: Broad disc osteophyte complex, advanced bilateral facet arthrosis, associated facet hypertrophy, and laminar hypertrophy result in severe central canal stenosis within the sub foraminal zone. Mild right and moderate left neuroforaminal narrowing. Impingement of the right lateral recess probable impingement of the crossing right L5 nerve root. Possible impingement the crossing left L5 nerve root. L5-S1: Bilateral facet arthrosis, severe on the left. No significant neuroforaminal narrowing or canal stenosis. IMPRESSION: 1. Acute to subacute fracture of the right sacral ala with mild override of the fracture fragments suggestive of a lateral compression type injury. 2. No acute fracture or malalignment of the lumbar  spine. 3. 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 4. Impingement of the right lateral recesses at L3-4 and L4-5 with possible impingement of the crossing right L4 and L5 nerve roots. 5. Multilevel neuroforaminal narrowing, moderate on the left at L4-5. 6. 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 7. 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 Electronically Signed: By: Dorethia Molt M.D. On: 02/13/2024 04:26   CT Cervical Spine Wo Contrast Result Date: 02/13/2024 EXAM: CT CERVICAL SPINE WITHOUT CONTRAST 02/13/2024 03:58:59 AM TECHNIQUE: CT of the cervical spine was performed without the administration of intravenous contrast. Multiplanar reformatted images are provided for review. Automated exposure control, iterative reconstruction, and/or weight based adjustment of the mA/kV was utilized to reduce the radiation dose to as low as  reasonably achievable. COMPARISON: None available. CLINICAL HISTORY: Neck trauma (Age >= 65y). No contrast. Lower back pain from a mechanical fall this afternoon. Didn't hit his head. No LOC. Takes eliquis . FINDINGS: CERVICAL SPINE: BONES AND ALIGNMENT: Reversal of the normal cervical lordosis is stable. Grade 1 anterolisthesis at C3-4 is stable. DEGENERATIVE CHANGES: Endplate degenerative changes and uncovertebral spurring results in moderate foraminal stenosis bilaterally at C4-5 and C5-6 and on the right at C6-7. SOFT TISSUES: No prevertebral soft tissue swelling. VASCULATURE: Atherosclerotic changes are present with carotid bifurcations bilaterally without definite stenosis. PLEURAL SPACES: Right pleural effusion and chronic right pleural calcifications are again noted. IMPRESSION: 1. No acute abnormality of the cervical spine related to the reported neck trauma. 2. Stable reversal of the normal cervical lordosis and grade 1 anterolisthesis at C3-4. 3.  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 Electronically signed by: Lonni Necessary MD 02/13/2024 04:19 AM EDT RP Workstation: [id number]   CT Head Wo Contrast Result Date: 02/13/2024 EXAM: CT HEAD WITHOUT CONTRAST 02/13/2024 03:58:59 AM TECHNIQUE: CT of the head was performed without the administration of intravenous contrast. Automated exposure control, iterative reconstruction, and/or weight based adjustment of the mA/kV was utilized to reduce the radiation dose to as low as reasonably achievable. COMPARISON: CT head without contrast 11/18/2023. CLINICAL HISTORY: Head trauma, moderate-severe. No contrast. Lower back pain from a mechanical fall this afternoon. Didn't hit his head. No LOC. Takes eliquis . FINDINGS: BRAIN AND VENTRICLES: No acute hemorrhage. Gray-white differentiation is preserved. No hydrocephalus. No extra-axial collection. No mass effect or midline shift. Mild atrophy and white matter changes are similar to prior study. ORBITS: No acute abnormality. SINUSES: No acute abnormality. SOFT TISSUES AND SKULL: No acute soft tissue abnormality. No skull fracture. VASCULATURE: Atherosclerotic calcifications are present in the cavernous carotid arteries bilaterally and at the dural margin of both vertebral arteries. No hyperdense vessel is present. IMPRESSION: 1. No acute intracranial abnormality. 2. Mild atrophy and white matter changes, similar to prior study. 3. Atherosclerosis Electronically signed by: Lonni Necessary MD 02/13/2024 04:16 AM EDT RP Workstation: [id number]   DG Chest Portable 1 View Result Date: 02/13/2024 CLINICAL DATA:  Recent fall with chest pain, initial encounter EXAM: PORTABLE CHEST 1 VIEW COMPARISON:  11/18/2023 FINDINGS: Cardiac shadow is enlarged. Aortic calcifications are noted. The lungs are well aerated without focal infiltrate. Chronic scarring in the right  base is seen. No pneumothorax is noted. No acute bony abnormality is seen. IMPRESSION: Chronic changes without acute abnormality. Electronically Signed   By: Oneil Devonshire M.D.   On: 02/13/2024 00:08   DG Elbow Complete Right Result Date: 02/13/2024 CLINICAL DATA:  Recent fall with right elbow pain, initial encounter EXAM: RIGHT ELBOW - COMPLETE 3+ VIEW COMPARISON:  None Available. FINDINGS: There is no evidence of fracture, dislocation, or joint effusion. There is no evidence of arthropathy or other focal bone abnormality. Soft tissues are unremarkable. IMPRESSION: No acute abnormality noted. Electronically Signed   By: Oneil Devonshire M.D.   On: 02/13/2024 00:08   DG Knee Right Port Result Date: 02/13/2024 CLINICAL DATA:  Recent fall with right knee pain, initial encounter EXAM: PORTABLE RIGHT KNEE - 2 VIEW COMPARISON:  08/31/2023 FINDINGS: Degenerative changes are noted in the medial joint space. Diffuse soft tissue swelling is noted. No acute fracture or dislocation is identified. No joint effusion is seen. IMPRESSION: Degenerative change without acute abnormality. Electronically Signed   By: Oneil  Lukens M.D.   On: 02/13/2024 00:07   DG Knee Left Port Result Date: 02/13/2024 CLINICAL DATA:  Recent fall with knee pain, initial encounter EXAM: PORTABLE LEFT KNEE - 2 VIEW COMPARISON:  None Available. 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 No acute fracture or dislocation is noted. No joint effusion is seen. Diffuse vascular calcifications are noted. IMPRESSION: No acute abnormality noted. Electronically Signed   By: Oneil Devonshire M.D.   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 Lucency is noted over the superior pubic rami bilaterally suspicious for undisplaced fractures. CT would be helpful for further  evaluation. No other bony abnormality is seen. IMPRESSION: Findings suspicious for bilateral superior pubic rami fractures. CT would be helpful in this regard. Electronically Signed   By: Oneil Devonshire M.D.   On: 02/13/2024 00:06      Assessment/Plan 83 yo male s/p mechanical fall. - Pelvic fractures with hematoma: Ortho consult. No definite extravasation on imaging, and patient has remained hemodynamically stable 12 hours out from initial injury. - Acute on chronic anemia: Transfusing PRBCs - Multimodal pain control - Hold Eliquis  - Patient admitted to medicine, trauma will follow   Leonor Dawn, MD [facility] Surgery General, Hepatobiliary and Pancreatic Surgery 02/13/24 6:22 AM

## 2024-02-13 NOTE — H&P (Addendum)
 History and Physical    Patient: [name] FMW:[id number] DOB: Jan 03, 1941 DOA: 02/12/2024 DOS: the patient was seen and examined on 02/13/2024 PCP: Rexanne Ingle, MD  Patient coming from: Home  Chief Complaint:  Chief Complaint  Patient presents with   Fall   HPI: [name] is a 83 y.o. male with medical history significant of CAD, HFpEF, PAF, and HTN p/w GLF c/b b/l pubic rami fractures and found to have symptomatic anemia.  Pt was in his USOH until yesterday evening around 1700 when he fell. Pt reports bending over and reaching for an item in the lower cabinet, when he lost balance, and fell backwards onto his butt (no LOC or head strike). He was able to get himself off the ground before calling EMS. Pt denied any lightheadedness or dizziness prior to his fall. Of note, pt reports taking OAC for known Afib.  In the ED, pt tachycardic and hypertensive. Labs notable for Hb 6.3, Fe 34(low), TIBC 371 (nl), % sat 9 (low), and ferritin 13 (low). FOBT neg. CTH w/ NAICA. CT chest/abd/pelvis showed acute fractures of the right superior, left superior and inferior pubic rami with mild displacement suggested a lateral compression type injury, and adjacent extraperitoneal hemorrhage. EDP consulted Trauma/Ortho sx who recommended PT/OT evaluation, blood transfusion, and medicine admission.  Review of Systems: As mentioned in the history of present illness. All other systems reviewed and are negative. Past Medical History:  Diagnosis Date   Ascending aorta dilatation (HCC)    44mm by chest CT angio 04/2018   CAD (coronary artery disease)    a. 05/2006 Abnl stress test w/ 2-12mm ST dep; b. 05/2006 Cath/PCI: LM nl, LAD 30-40ost, 20-30p, 90d (small), D1 nl, D2 nl, LCX nl, OM1/2 nl, RCA Ca2+, 7m/d (3.5x16 [facility] BMS & 3.5x12 [facility] BMS), EF 50%.   Chronic diastolic CHF (congestive heart failure) (HCC)    a. 10/2017 Echo: EF 50-55%, mild AI, sev dil LA, mod dil RA.   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 CHA2DS2VASc 5-->Eliquis ; b. Prev WCT on flecainide;  c. 12/2017 s/p DCCV.   Pneumothorax    Prolonged Q-T interval on ECG 06/22/2022   Ventricular tachycardia (HCC)    a. possibly proarrhythmia from flecainide.   Past Surgical History:  Procedure Laterality Date   CARDIOVERSION N/A 12/23/2017   Procedure: CARDIOVERSION;  Surgeon: Shlomo Wilbert SAUNDERS, MD;  Location: [facility] ENDOSCOPY;  Service: Cardiovascular;  Laterality: N/A;   CARDIOVERSION N/A 03/08/2018   Procedure: CARDIOVERSION;  Surgeon: Shlomo Wilbert SAUNDERS, MD;  Location: [facility] ENDOSCOPY;  Service: Cardiovascular;  Laterality: N/A;   INGUINAL HERNIA REPAIR     x2   left total hip surgery     TEE WITHOUT CARDIOVERSION N/A 03/08/2018   Procedure: TRANSESOPHAGEAL ECHOCARDIOGRAM (TEE);  Surgeon: Shlomo Wilbert SAUNDERS, MD;  Location: [facility] ENDOSCOPY;  Service: Cardiovascular;  Laterality: N/A;   TOTAL HIP ARTHROPLASTY Right 08/19/2020   Procedure: TOTAL HIP ARTHROPLASTY ANTERIOR APPROACH;  Surgeon: Melodi Lerner, MD;  Location: [facility] ORS;  Service: Orthopedics;  Laterality: Right;    TOTAL KNEE ARTHROPLASTY Left 02/28/2018   Procedure: LEFT TOTAL KNEE ARTHROPLASTY;  Surgeon: Melodi Lerner, MD;  Location: [facility] ORS;  Service: Orthopedics;  Laterality: Left;   VEIN LIGATION AND STRIPPING Left 04/08/2020   Procedure: LEFT LOWER EXTREMITY VEIN LIGATION AND EXCISION OF ULCER;  Surgeon: Eliza Lonni RAMAN, MD;  Location: [facility]  OR;  Service: Vascular;  Laterality: Left;   Social History:  reports that he has never smoked. He has never used smokeless tobacco. He reports that he does not currently use alcohol. He reports that he does not currently use drugs after having used the following drugs: Other-see comments.  No Known Allergies  Family History  Problem Relation Age of Onset   Coronary artery disease Other     Prior to  Admission medications   Medication Sig Start Date End Date Taking? Authorizing Provider  acetaminophen  (TYLENOL ) 325 MG tablet Take 2 tablets (650 mg total) by mouth every 6 (six) hours as needed for moderate pain. Patient taking differently: Take 650 mg by mouth in the morning and at bedtime. 03/17/22  Yes Christopher Savannah, PA-C  amiodarone  (PACERONE ) 200 MG tablet Take 200 mg by mouth daily.   Yes [provider]  apixaban  (ELIQUIS ) 2.5 MG TABS tablet Take 1 tablet (2.5 mg total) by mouth 2 (two) times daily. 06/04/23  Yes Cindie Ole DASEN, MD  atorvastatin  (LIPITOR) 20 MG tablet TAKE 1 TABLET(20 MG) BY MOUTH DAILY 06/04/23  Yes Turner, Wilbert SAUNDERS, MD  Boswellia Serrata (BOSWELLIA PO) Take 1,200 mg by mouth daily.   Yes [provider]  carvedilol  (COREG ) 6.25 MG tablet Take 1 tablet (6.25 mg total) by mouth 2 (two) times daily. 10/20/23  Yes Cindie Ole DASEN, MD  furosemide  (LASIX ) 20 MG tablet Take 20 mg by mouth 2 (two) times daily.   Yes [provider]  lidocaine  (LIDODERM ) 5 % Place 1 patch onto the skin daily. Apply to lower back in the morning and take off after 12hrs.   Yes [provider]  Misc Natural Products (GLUCOSAMINE CHONDROITIN TRIPLE) TABS Take 1 tablet by mouth daily.   Yes [provider]  Multiple Vitamin (MULTIVITAMIN WITH MINERALS) TABS tablet Take 1 tablet by mouth daily.   Yes [provider]  Omega-3 Fatty Acids (FISH OIL) 600 MG CAPS Take 1,200 mg by mouth in the morning and at bedtime.   Yes [provider]    Physical Exam: Vitals:   02/13/24 1148 02/13/24 1150 02/13/24 1156 02/13/24 1156  BP:  (!) 148/84    Pulse:  (!) 102    Resp:  19 18   Temp: 98.5 F (36.9 C)     TempSrc: Oral     SpO2:  100%  100%  Weight:      Height:       General: Alert, oriented x3, resting comfortably in no acute distress HEENT: EOMI, oropharynx clear, moist mucous membranes, hearing intact Neck: Trachea midline and no  gross thyromegaly Respiratory: Lungs clear to auscultation bilaterally with normal respiratory effort; no w/r/r Cardiovascular: Regular rate and rhythm w/o m/r/g Abdomen: Soft, nontender, nondistended. Positive bowel sounds MSK: No obvious joint deformities or swelling Skin: No obvious rashes or lesions Neurologic: Awake, alert, spontaneously moves all extremities, strength intact Psychiatric: Appropriate mood and affect, conversational and cooperative  Data Reviewed:  Lab Results  Component Value Date   WBC 10.3 02/13/2024   HGB 6.3 (LL) 02/13/2024   HCT 21.6 (L) 02/13/2024   MCV 82.4 02/13/2024   PLT 183 02/13/2024   Lab Results  Component Value Date   GLUCOSE 112 (H) 02/13/2024   CALCIUM  8.0 (L) 02/13/2024   NA 140 02/13/2024   K 3.7 02/13/2024   CO2 22 02/13/2024   CL 108 02/13/2024   BUN 20 02/13/2024   CREATININE 1.10 02/13/2024   Lab Results  Component Value Date   ALT 29 02/13/2024   AST 39 02/13/2024   ALKPHOS 46 02/13/2024   BILITOT 0.8 02/13/2024   Lab Results  Component Value Date   INR 1.4 (H) 11/18/2023   INR 1.1 08/19/2020   INR SPECIMEN CLOTTED 08/19/2020    Radiology: CT L-SPINE NO CHARGE Addendum Date: 02/13/2024 ADDENDUM REPORT: 02/13/2024 04:56 ADDENDUM: A transcription error resulted in replacement of the appropriate dictation for this examination. The findings and impression are as follows: Extensive multi-vessel coronary artery calcification. Mild global cardiomegaly. Small pericardial effusion. Central pulmonary arteries are enlarged in keeping with changes of pulmonary arterial hypertension. 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 Superimposed moderate atherosclerotic calcification of the thoracic aorta. Visualized thyroid  is unremarkable. No pathologic thoracic adenopathy. Esophagus is unremarkable. No mediastinal hematoma. No  pneumomediastinum. Small bilateral pleural effusions are present, right greater than left. Mild bibasilar compressive atelectasis. No confluent pulmonary infiltrate. No pneumothorax. No central obstructing lesion. Thoracic dextroscoliosis noted. No acute bone abnormality within the thorax. Liver, gallbladder, pancreas, spleen, and left adrenal glands are unremarkable. 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 Severe left renal cortical atrophy the right kidney is normal in size and position. Multiple simple and mildly complex cortical cysts are seen within the left kidney for which no follow-up imaging is recommended. No hydronephrosis. No intrarenal or ureteral calculi. Streak artifact limits evaluation of the bladder. The bladder, however, appears distended and is markedly thick walled suggesting superimposed diffuse infectious or inflammatory cystitis. Mild distal colonic and cecal diverticulosis. Stomach, small bowel, and large bowel otherwise unremarkable. No free intraperitoneal gas or fluid. Appendix normal. Mild aortoiliac atherosclerotic calcification. No aortic aneurysm. No pathologic adenopathy within the abdomen and pelvis. 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 There are acute fractures of the left superior and inferior pubic rami as well as the right superior pubic ramus. Mild override the left pubic rami fractures is in keeping with a lateral compression type injury. Subacute fracture of the right sacral ala with resorption along the fracture margin. There is infiltration within the space of Retzius surrounding the bladder in keeping with probable extraperitoneal hemorrhage adjacent to the left superior pubic symphyseal fracture. No definite active extravasation identified though imaging is limited by streak artifact. Impression; Acute fractures of the right superior and  left superior and inferior pubic rami with mild displacement suggested a lateral compression type injury. Adjacent extraperitoneal hemorrhage within the space of Retzius without definite active extravasation identified. Circumferential bladder wall thickening suggestive of a diffuse infectious or inflammatory cystitis. Additional incidental findings as noted above. Electronically Signed   By: Dorethia Molt M.D.   On: 02/13/2024 04:56   Result Date: 02/13/2024 CLINICAL DATA:  Fall, low back pain EXAM: CT LUMBAR SPINE WITHOUT CONTRAST TECHNIQUE: Multidetector CT imaging of the lumbar spine was performed without intravenous contrast administration. Multiplanar CT image reconstructions were also generated. RADIATION DOSE REDUCTION: This exam was performed according to the departmental dose-optimization program which includes automated exposure control, adjustment of the mA and/or kV according to patient size and/or use of iterative reconstruction technique. COMPARISON:  None Available. 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 Alignment: Moderate lumbar levoscoliosis, apex left at L3. Normal lumbar lordosis. No listhesis. Vertebrae: No acute fracture of the lumbar spine; vertebral  body height is preserved. No lytic or blastic bone lesion. Acute to subacute fracture of the right sacral ala noted with mild override of the fracture fragments suggestive of a lateral compression type injury. Osseous structures are diffusely osteopenic. Paraspinal and other soft tissues: See accompanying report for CT examination of the chest, abdomen, and pelvis. No paraspinal fluid collection or inflammatory change identified. Disc levels: Intervertebral disc space narrowing and endplate remodeling at L3-S1 is present in keeping with changes of advanced degenerative disc disease. Axial images demonstrate: L1-2: Unremarkable L2-3: Mild broad-based disc bulge and mild bilateral laminar  hypertrophy result in mild central canal stenosis. No significant neuroforaminal narrowing. Mild bilateral facet arthrosis. L3-4: Posterior disc osteophyte complex. Mild bilateral facet arthrosis with associated hypertrophy. Resultant moderate central canal stenosis within the sub foraminal zone. No significant neuroforaminal narrowing. Impingement the right lateral recess with possible impingement of the crossing right L4 nerve root L4-5: Broad disc osteophyte complex, advanced bilateral facet arthrosis, associated facet hypertrophy, and laminar hypertrophy result in severe central canal stenosis within the sub foraminal zone. Mild right and moderate left neuroforaminal narrowing. Impingement of the right lateral recess probable impingement of the crossing right L5 nerve root. Possible impingement the crossing left L5 nerve root. L5-S1: Bilateral facet arthrosis, severe on the left. No significant neuroforaminal narrowing or canal stenosis. IMPRESSION: 1. Acute to subacute fracture of the right sacral ala with mild override of the fracture fragments suggestive of a lateral compression type injury. 2. No acute fracture or malalignment of the lumbar spine. 3. 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 4. Impingement of the right lateral recesses at L3-4 and L4-5 with possible impingement of the crossing right L4 and L5 nerve roots. 5. Multilevel neuroforaminal narrowing, moderate on the left at L4-5. 6. 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 7. 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 Electronically Signed: By: Dorethia Molt M.D. On: 02/13/2024 04:26   CT T-SPINE NO CHARGE Addendum Date: 02/13/2024 ADDENDUM REPORT: 02/13/2024 04:56 ADDENDUM: A transcription error resulted in replacement of the appropriate dictation for this examination. The findings and impression are as follows:  Extensive multi-vessel coronary artery calcification. Mild global cardiomegaly. Small pericardial effusion. Central pulmonary arteries are enlarged in keeping with changes of pulmonary arterial hypertension. 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 Superimposed moderate atherosclerotic calcification of the thoracic aorta. Visualized thyroid  is unremarkable. No pathologic thoracic adenopathy. Esophagus is unremarkable. No mediastinal hematoma. No pneumomediastinum. Small bilateral pleural effusions are present, right greater than left. Mild bibasilar compressive atelectasis. No confluent pulmonary infiltrate. No pneumothorax. No central obstructing lesion. Thoracic dextroscoliosis noted. No acute bone abnormality within the thorax. Liver, gallbladder, pancreas, spleen, and left adrenal glands are unremarkable. 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 Severe left renal cortical atrophy the right kidney is normal in size and position. Multiple simple and mildly complex cortical cysts are seen within the left kidney for which no follow-up imaging is recommended. No hydronephrosis. No intrarenal or ureteral calculi. Streak artifact limits evaluation of the bladder. The bladder, however, appears distended and is markedly thick walled suggesting superimposed diffuse infectious or inflammatory cystitis. Mild distal colonic and cecal diverticulosis. Stomach, small bowel, and large bowel otherwise  unremarkable. No free intraperitoneal gas or fluid. Appendix normal. Mild aortoiliac atherosclerotic calcification. No aortic aneurysm. No pathologic adenopathy within the abdomen and pelvis. 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 There are acute fractures of the left superior and inferior  pubic rami as well as the right superior pubic ramus. Mild override the left pubic rami fractures is in keeping with a lateral compression type injury. Subacute fracture of the right sacral ala with resorption along the fracture margin. There is infiltration within the space of Retzius surrounding the bladder in keeping with probable extraperitoneal hemorrhage adjacent to the left superior pubic symphyseal fracture. No definite active extravasation identified though imaging is limited by streak artifact. Impression; Acute fractures of the right superior and left superior and inferior pubic rami with mild displacement suggested a lateral compression type injury. Adjacent extraperitoneal hemorrhage within the space of Retzius without definite active extravasation identified. Circumferential bladder wall thickening suggestive of a diffuse infectious or inflammatory cystitis. Additional incidental findings as noted above. Electronically Signed   By: Dorethia Molt M.D.   On: 02/13/2024 04:56   Result Date: 02/13/2024 CLINICAL DATA:  Fall, low back pain EXAM: CT LUMBAR SPINE WITHOUT CONTRAST TECHNIQUE: Multidetector CT imaging of the lumbar spine was performed without intravenous contrast administration. Multiplanar CT image reconstructions were also generated. RADIATION DOSE REDUCTION: This exam was performed according to the departmental dose-optimization program which includes automated exposure control, adjustment of the mA and/or kV according to patient size and/or use of iterative reconstruction technique. COMPARISON:  None Available. 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 Alignment: Moderate lumbar levoscoliosis, apex left at L3. Normal lumbar lordosis. No listhesis. Vertebrae: No acute fracture of the lumbar spine; vertebral body height is preserved. No lytic or blastic bone lesion. Acute to subacute fracture of the right sacral ala noted with mild override of the  fracture fragments suggestive of a lateral compression type injury. Osseous structures are diffusely osteopenic. Paraspinal and other soft tissues: See accompanying report for CT examination of the chest, abdomen, and pelvis. No paraspinal fluid collection or inflammatory change identified. Disc levels: Intervertebral disc space narrowing and endplate remodeling at L3-S1 is present in keeping with changes of advanced degenerative disc disease. Axial images demonstrate: L1-2: Unremarkable L2-3: Mild broad-based disc bulge and mild bilateral laminar hypertrophy result in mild central canal stenosis. No significant neuroforaminal narrowing. Mild bilateral facet arthrosis. L3-4: Posterior disc osteophyte complex. Mild bilateral facet arthrosis with associated hypertrophy. Resultant moderate central canal stenosis within the sub foraminal zone. No significant neuroforaminal narrowing. Impingement the right lateral recess with possible impingement of the crossing right L4 nerve root L4-5: Broad disc osteophyte complex, advanced bilateral facet arthrosis, associated facet hypertrophy, and laminar hypertrophy result in severe central canal stenosis within the sub foraminal zone. Mild right and moderate left neuroforaminal narrowing. Impingement of the right lateral recess probable impingement of the crossing right L5 nerve root. Possible impingement the crossing left L5 nerve root. L5-S1: Bilateral facet arthrosis, severe on the left. No significant neuroforaminal narrowing or canal stenosis. IMPRESSION: 1. Acute to subacute fracture of the right sacral ala with mild override of the fracture fragments suggestive of a lateral compression type injury. 2. No acute fracture or malalignment of the lumbar spine. 3. 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 4. Impingement of the right lateral recesses at L3-4 and L4-5 with possible  impingement of the crossing right L4  and L5 nerve roots. 5. Multilevel neuroforaminal narrowing, moderate on the left at L4-5. 6. 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 7. 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 Electronically Signed: By: Dorethia Molt M.D. On: 02/13/2024 04:26   CT CHEST ABDOMEN PELVIS W CONTRAST Addendum Date: 02/13/2024 ADDENDUM REPORT: 02/13/2024 04:56 ADDENDUM: A transcription error resulted in replacement of the appropriate dictation for this examination. The findings and impression are as follows: Extensive multi-vessel coronary artery calcification. Mild global cardiomegaly. Small pericardial effusion. Central pulmonary arteries are enlarged in keeping with changes of pulmonary arterial hypertension. 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 Superimposed moderate atherosclerotic calcification of the thoracic aorta. Visualized thyroid  is unremarkable. No pathologic thoracic adenopathy. Esophagus is unremarkable. No mediastinal hematoma. No pneumomediastinum. Small bilateral pleural effusions are present, right greater than left. Mild bibasilar compressive atelectasis. No confluent pulmonary infiltrate. No pneumothorax. No central obstructing lesion. Thoracic dextroscoliosis noted. No acute bone abnormality within the thorax. Liver, gallbladder, pancreas, spleen, and left adrenal glands are unremarkable. 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 Severe left renal cortical atrophy the right kidney is normal in size and position. Multiple simple and mildly complex cortical cysts are seen within the left kidney for which no follow-up imaging is recommended. No hydronephrosis. No intrarenal or ureteral calculi. Streak artifact limits  evaluation of the bladder. The bladder, however, appears distended and is markedly thick walled suggesting superimposed diffuse infectious or inflammatory cystitis. Mild distal colonic and cecal diverticulosis. Stomach, small bowel, and large bowel otherwise unremarkable. No free intraperitoneal gas or fluid. Appendix normal. Mild aortoiliac atherosclerotic calcification. No aortic aneurysm. No pathologic adenopathy within the abdomen and pelvis. 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 There are acute fractures of the left superior and inferior pubic rami as well as the right superior pubic ramus. Mild override the left pubic rami fractures is in keeping with a lateral compression type injury. Subacute fracture of the right sacral ala with resorption along the fracture margin. There is infiltration within the space of Retzius surrounding the bladder in keeping with probable extraperitoneal hemorrhage adjacent to the left superior pubic symphyseal fracture. No definite active extravasation identified though imaging is limited by streak artifact. Impression; Acute fractures of the right superior and left superior and inferior pubic rami with mild displacement suggested a lateral compression type injury. Adjacent extraperitoneal hemorrhage within the space of Retzius without definite active extravasation identified. Circumferential bladder wall thickening suggestive of a diffuse infectious or inflammatory cystitis. Additional incidental findings as noted above. Electronically Signed   By: Dorethia Molt M.D.   On: 02/13/2024 04:56   Result Date: 02/13/2024 CLINICAL DATA:  Fall, low back pain EXAM: CT LUMBAR SPINE WITHOUT CONTRAST TECHNIQUE: Multidetector CT imaging of the lumbar spine was performed without intravenous contrast administration. Multiplanar CT image reconstructions were also generated. RADIATION DOSE REDUCTION: This exam was performed according to the departmental dose-optimization program  which includes automated exposure control, adjustment of the mA and/or kV according to patient size and/or use of iterative reconstruction technique. COMPARISON:  None Available. 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 Alignment: Moderate lumbar levoscoliosis, apex left at L3. Normal lumbar lordosis. No listhesis. Vertebrae: No acute fracture of the lumbar spine; vertebral body height is preserved. No lytic or  blastic bone lesion. Acute to subacute fracture of the right sacral ala noted with mild override of the fracture fragments suggestive of a lateral compression type injury. Osseous structures are diffusely osteopenic. Paraspinal and other soft tissues: See accompanying report for CT examination of the chest, abdomen, and pelvis. No paraspinal fluid collection or inflammatory change identified. Disc levels: Intervertebral disc space narrowing and endplate remodeling at L3-S1 is present in keeping with changes of advanced degenerative disc disease. Axial images demonstrate: L1-2: Unremarkable L2-3: Mild broad-based disc bulge and mild bilateral laminar hypertrophy result in mild central canal stenosis. No significant neuroforaminal narrowing. Mild bilateral facet arthrosis. L3-4: Posterior disc osteophyte complex. Mild bilateral facet arthrosis with associated hypertrophy. Resultant moderate central canal stenosis within the sub foraminal zone. No significant neuroforaminal narrowing. Impingement the right lateral recess with possible impingement of the crossing right L4 nerve root L4-5: Broad disc osteophyte complex, advanced bilateral facet arthrosis, associated facet hypertrophy, and laminar hypertrophy result in severe central canal stenosis within the sub foraminal zone. Mild right and moderate left neuroforaminal narrowing. Impingement of the right lateral recess probable impingement of the crossing right L5 nerve root. Possible impingement the crossing left L5  nerve root. L5-S1: Bilateral facet arthrosis, severe on the left. No significant neuroforaminal narrowing or canal stenosis. IMPRESSION: 1. Acute to subacute fracture of the right sacral ala with mild override of the fracture fragments suggestive of a lateral compression type injury. 2. No acute fracture or malalignment of the lumbar spine. 3. 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 4. Impingement of the right lateral recesses at L3-4 and L4-5 with possible impingement of the crossing right L4 and L5 nerve roots. 5. Multilevel neuroforaminal narrowing, moderate on the left at L4-5. 6. 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 7. 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 Electronically Signed: By: Dorethia Molt M.D. On: 02/13/2024 04:26   CT Cervical Spine Wo Contrast Result Date: 02/13/2024 EXAM: CT CERVICAL SPINE WITHOUT CONTRAST 02/13/2024 03:58:59 AM TECHNIQUE: CT of the cervical spine was performed without the administration of intravenous contrast. Multiplanar reformatted images are provided for review. Automated exposure control, iterative reconstruction, and/or weight based adjustment of the mA/kV was utilized to reduce the radiation dose to as low as reasonably achievable. COMPARISON: None available. CLINICAL HISTORY: Neck trauma (Age >= 65y). No contrast. Lower back pain from a mechanical fall this afternoon. Didn't hit his head. No LOC. Takes eliquis . FINDINGS: CERVICAL SPINE: BONES AND ALIGNMENT: Reversal of the normal cervical lordosis is stable. Grade 1 anterolisthesis at C3-4 is stable. DEGENERATIVE CHANGES: Endplate degenerative changes and uncovertebral spurring results in moderate foraminal stenosis bilaterally at C4-5 and C5-6 and on the right at C6-7. SOFT TISSUES: No prevertebral soft tissue swelling. VASCULATURE: Atherosclerotic changes are present with carotid  bifurcations bilaterally without definite stenosis. PLEURAL SPACES: Right pleural effusion and chronic right pleural calcifications are again noted. IMPRESSION: 1. No acute abnormality of the cervical spine related to the reported neck trauma. 2. Stable reversal of the normal cervical lordosis and grade 1 anterolisthesis at C3-4. 3. 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 Electronically signed by: Lonni Necessary MD 02/13/2024 04:19 AM EDT RP Workstation: [id number]   CT Head Wo Contrast Result Date: 02/13/2024 EXAM: CT HEAD WITHOUT CONTRAST 02/13/2024 03:58:59 AM TECHNIQUE: CT of the head was performed without  the administration of intravenous contrast. Automated exposure control, iterative reconstruction, and/or weight based adjustment of the mA/kV was utilized to reduce the radiation dose to as low as reasonably achievable. COMPARISON: CT head without contrast 11/18/2023. CLINICAL HISTORY: Head trauma, moderate-severe. No contrast. Lower back pain from a mechanical fall this afternoon. Didn't hit his head. No LOC. Takes eliquis . FINDINGS: BRAIN AND VENTRICLES: No acute hemorrhage. Gray-white differentiation is preserved. No hydrocephalus. No extra-axial collection. No mass effect or midline shift. Mild atrophy and white matter changes are similar to prior study. ORBITS: No acute abnormality. SINUSES: No acute abnormality. SOFT TISSUES AND SKULL: No acute soft tissue abnormality. No skull fracture. VASCULATURE: Atherosclerotic calcifications are present in the cavernous carotid arteries bilaterally and at the dural margin of both vertebral arteries. No hyperdense vessel is present. IMPRESSION: 1. No acute intracranial abnormality. 2. Mild atrophy and white matter changes, similar to prior study. 3. Atherosclerosis Electronically signed by: Lonni Necessary MD 02/13/2024 04:16 AM EDT RP Workstation:  [id number]   DG Chest Portable 1 View Result Date: 02/13/2024 CLINICAL DATA:  Recent fall with chest pain, initial encounter EXAM: PORTABLE CHEST 1 VIEW COMPARISON:  11/18/2023 FINDINGS: Cardiac shadow is enlarged. Aortic calcifications are noted. The lungs are well aerated without focal infiltrate. Chronic scarring in the right base is seen. No pneumothorax is noted. No acute bony abnormality is seen. IMPRESSION: Chronic changes without acute abnormality. Electronically Signed   By: Oneil Devonshire M.D.   On: 02/13/2024 00:08   DG Elbow Complete Right Result Date: 02/13/2024 CLINICAL DATA:  Recent fall with right elbow pain, initial encounter EXAM: RIGHT ELBOW - COMPLETE 3+ VIEW COMPARISON:  None Available. FINDINGS: There is no evidence of fracture, dislocation, or joint effusion. There is no evidence of arthropathy or other focal bone abnormality. Soft tissues are unremarkable. IMPRESSION: No acute abnormality noted. Electronically Signed   By: Oneil Devonshire M.D.   On: 02/13/2024 00:08   DG Knee Right Port Result Date: 02/13/2024 CLINICAL DATA:  Recent fall with right knee pain, initial encounter EXAM: PORTABLE RIGHT KNEE - 2 VIEW COMPARISON:  08/31/2023 FINDINGS: Degenerative changes are noted in the medial joint space. Diffuse soft tissue swelling is noted. No acute fracture or dislocation is identified. No joint effusion is seen. IMPRESSION: Degenerative change without acute abnormality. Electronically Signed   By: Oneil Devonshire M.D.   On: 02/13/2024 00:07   DG Knee Left Port Result Date: 02/13/2024 CLINICAL DATA:  Recent fall with knee pain, initial encounter EXAM: PORTABLE LEFT KNEE - 2 VIEW COMPARISON:  None Available. 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 No acute fracture or dislocation is noted. No joint effusion is seen. Diffuse vascular calcifications are noted. IMPRESSION: No acute abnormality noted. Electronically Signed   By: Oneil Devonshire M.D.   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 Lucency is noted over the superior pubic rami bilaterally suspicious for undisplaced fractures. CT would be helpful for further evaluation. No other bony abnormality is seen. IMPRESSION: Findings suspicious for bilateral superior pubic rami fractures. CT would be helpful in this regard. Electronically Signed   By: Oneil Devonshire M.D.   On: 02/13/2024 00:06    Assessment and Plan: 58M h/o CAD, HFpEF, PAF, and HTN p/w GLF c/b b/l pubic rami fractures and found to have symptomatic anemia.  B/l pubic rami  fracture c/b adjacent hematoma -Trauma and Ortho consulted; recs: blood transfusion, ambulation with PT/OT and post-ambulation films  Symptomatic anemia -Transfusion per EGS -F/u CBC q12h for now -MIVF: NS at 100cc/h for 24h  Chronic urinary retention Pt with I/O cath prn pta -Foley placed 7/27  PAF -HOLD pta apixaban  for now -PTA amiodarone  200mg  daily and Coreg  6.25mg  BID  HLD -PTA atorvastatin  20mg  daily   Advance Care Planning:   Code Status: Full Code   Consults: Ortho and Trauma sx  Family Communication: Daughters  Severity of Illness: The appropriate patient status for this patient is INPATIENT. Inpatient status is judged to be reasonable and necessary in order to provide the required intensity of service to ensure the patient's safety. The patient's presenting symptoms, physical exam findings, and initial radiographic and laboratory data in the context of their chronic comorbidities is felt to place them at high risk for further clinical deterioration. Furthermore, it is not anticipated that the patient will be medically stable for discharge from the hospital within 2 midnights of admission.   * I certify that at the point of admission it is my clinical judgment that  the patient will require inpatient hospital care spanning beyond 2 midnights from the point of admission due to high intensity of service, high risk for further deterioration and high frequency of surveillance required.*   ------- I spent 57 minutes reviewing previous labs/notes, obtaining separate history at the bedside, counseling/discussing the treatment plan outlined above, ordering medications/tests, and performing clinical documentation.  Author: Marsha Ada, MD 02/13/2024 12:42 PM  For on call review [email or web address].

## 2024-02-14 ENCOUNTER — Inpatient Hospital Stay (HOSPITAL_COMMUNITY)

## 2024-02-14 DIAGNOSIS — W19XXXA Unspecified fall, initial encounter: Secondary | ICD-10-CM | POA: Diagnosis not present

## 2024-02-14 DIAGNOSIS — I10 Essential (primary) hypertension: Secondary | ICD-10-CM | POA: Diagnosis not present

## 2024-02-14 DIAGNOSIS — R000 Tachycardia, unspecified: Secondary | ICD-10-CM | POA: Diagnosis not present

## 2024-02-14 LAB — CBC WITH DIFFERENTIAL/PLATELET
Abs Immature Granulocytes: 0.07 K/uL (ref 0.00–0.07)
Basophils Absolute: 0 K/uL (ref 0.0–0.1)
Basophils Relative: 0 %
Eosinophils Absolute: 0.1 K/uL (ref 0.0–0.5)
Eosinophils Relative: 1 %
HCT: 27.4 % — ABNORMAL LOW (ref 39.0–52.0)
Hemoglobin: 8.6 g/dL — ABNORMAL LOW (ref 13.0–17.0)
Immature Granulocytes: 1 %
Lymphocytes Relative: 5 %
Lymphs Abs: 0.5 K/uL — ABNORMAL LOW (ref 0.7–4.0)
MCH: 26.2 pg (ref 26.0–34.0)
MCHC: 31.4 g/dL (ref 30.0–36.0)
MCV: 83.5 fL (ref 80.0–100.0)
Monocytes Absolute: 0.7 K/uL (ref 0.1–1.0)
Monocytes Relative: 6 %
Neutro Abs: 9.9 K/uL — ABNORMAL HIGH (ref 1.7–7.7)
Neutrophils Relative %: 87 %
Platelets: 145 K/uL — ABNORMAL LOW (ref 150–400)
RBC: 3.28 MIL/uL — ABNORMAL LOW (ref 4.22–5.81)
RDW: 17.8 % — ABNORMAL HIGH (ref 11.5–15.5)
WBC: 11.3 K/uL — ABNORMAL HIGH (ref 4.0–10.5)
nRBC: 0.4 % — ABNORMAL HIGH (ref 0.0–0.2)

## 2024-02-14 LAB — TYPE AND SCREEN
ABO/RH(D): O POS
Antibody Screen: NEGATIVE
Unit division: 0
Unit division: 0

## 2024-02-14 LAB — BASIC METABOLIC PANEL WITH GFR
Anion gap: 11 (ref 5–15)
BUN: 19 mg/dL (ref 8–23)
CO2: 21 mmol/L — ABNORMAL LOW (ref 22–32)
Calcium: 8 mg/dL — ABNORMAL LOW (ref 8.9–10.3)
Chloride: 108 mmol/L (ref 98–111)
Creatinine, Ser: 1.01 mg/dL (ref 0.61–1.24)
GFR, Estimated: >60 mL/min (ref >60)
Glucose, Bld: 103 mg/dL — ABNORMAL HIGH (ref 70–99)
Potassium: 4.4 mmol/L (ref 3.5–5.1)
Sodium: 140 mmol/L (ref 135–145)

## 2024-02-14 LAB — BPAM RBC
Blood Product Expiration Date: 202508202359
Blood Product Expiration Date: 202508222359
ISSUE DATE / TIME: 202507270504
ISSUE DATE / TIME: 202507270832
ISSUING PHYSICIAN: 202507270832
Unit Type and Rh: 202508222359
Unit Type and Rh: 5100
Unit Type and Rh: 5100

## 2024-02-14 LAB — MAGNESIUM: Magnesium: 2.1 mg/dL (ref 1.7–2.4)

## 2024-02-14 MED ORDER — QUETIAPINE FUMARATE 25 MG PO TABS
25.0000 mg | ORAL_TABLET | Freq: Every day | ORAL | Status: DC
  Administered 2024-02-14 – 2024-02-19 (×6): 25 mg via ORAL
  Filled 2024-02-14 (×6): qty 1

## 2024-02-14 MED ORDER — TAMSULOSIN HCL 0.4 MG PO CAPS
0.4000 mg | ORAL_CAPSULE | Freq: Every day | ORAL | Status: DC
  Administered 2024-02-14 – 2024-02-20 (×7): 0.4 mg via ORAL
  Filled 2024-02-14 (×7): qty 1

## 2024-02-14 NOTE — Evaluation (Signed)
 Occupational Therapy Evaluation Patient Details Name: [name] MRN: [id number] DOB: [birth year] Today's Date: 02/14/2024   History of Present Illness   Pt is an 83 y/o M admitted on 02/12/24 after presenting following a fall. Pt found to have bilateral pubic rami fx & adjacent hematoma, as well as symptomatic anemia. PMH: CAD, HFpEF, PAF, HTN, R THA     Clinical Impressions Patient admitted for the diagnosis above.  PTA he lives at home alone, needing no assist with ADL, iADL, community mobility.  Pain is the primary deficit impacting function, currently needing mod to max A for simple transfers and ADL completion. OT will continue efforts in the acute setting to address deficits, and Patient will benefit from continued inpatient follow up therapy, <3 hours/day.     If plan is discharge home, recommend the following:   Assist for transportation;Assistance with cooking/housework;A lot of help with walking and/or transfers;A lot of help with bathing/dressing/bathroom     Functional Status Assessment   Patient has had a recent decline in their functional status and demonstrates the ability to make significant improvements in function in a reasonable and predictable amount of time.     Equipment Recommendations   None recommended by OT     Recommendations for Other Services         Precautions/Restrictions   Precautions Precautions: Fall Recall of Precautions/Restrictions: Intact Restrictions Weight Bearing Restrictions Per Provider Order: Yes RLE Weight Bearing Per Provider Order: Weight bearing as tolerated LLE Weight Bearing Per Provider Order: Weight bearing as tolerated     Mobility Bed Mobility Overal bed mobility: Needs Assistance Bed Mobility: Supine to Sit, Sit to Supine     Supine to sit: Mod assist Sit to supine: Mod assist        Transfers Overall transfer level: Needs assistance Equipment used: Rolling walker (2 wheels) Transfers: Sit  to/from Stand, Bed to chair/wheelchair/BSC Sit to Stand: Mod assist     Step pivot transfers: Min assist, Mod assist            Balance Overall balance assessment: Needs assistance Sitting-balance support: Feet supported Sitting balance-Leahy Scale: Fair     Standing balance support: Reliant on assistive device for balance Standing balance-Leahy Scale: Poor                             ADL either performed or assessed with clinical judgement   ADL Overall ADL's : Needs assistance/impaired Eating/Feeding: Set up;Sitting   Grooming: Oral care;Contact guard assist;Sitting   Upper Body Bathing: Set up;Sitting   Lower Body Bathing: Maximal assistance;Sitting/lateral leans;Sit to/from stand   Upper Body Dressing : Contact guard assist;Sitting   Lower Body Dressing: Maximal assistance;Sit to/from stand;Sitting/lateral leans   Toilet Transfer: Moderate assistance;BSC/3in1;Rolling walker (2 wheels);Stand-pivot   Toileting- Clothing Manipulation and Hygiene: Maximal assistance;Sit to/from stand               Vision Patient Visual Report: No change from baseline       Perception Perception: Not tested       Praxis Praxis: Not tested       Pertinent Vitals/Pain Pain Assessment Pain Assessment: Faces Faces Pain Scale: Hurts even more Pain Location: L groin/hip, back Pain Descriptors / Indicators: Discomfort, Grimacing, Guarding Pain Intervention(s): Monitored during session     Extremity/Trunk Assessment Upper Extremity Assessment Upper Extremity Assessment: Overall WFL for tasks assessed   Lower Extremity Assessment Lower Extremity Assessment: Defer to PT  evaluation   Cervical / Trunk Assessment Cervical / Trunk Assessment: Kyphotic   Communication Communication Communication: No apparent difficulties   Cognition Arousal: Alert Behavior During Therapy: WFL for tasks assessed/performed Cognition: Cognition impaired       Memory  impairment (select all impairments): Short-term memory, Working memory Attention impairment (select first level of impairment): Sustained attention Executive functioning impairment (select all impairments): Initiation, Reasoning, Problem solving                   Following commands: Impaired Following commands impaired: Follows one step commands with increased time     Cueing  General Comments   Cueing Techniques: Verbal cues;Gestural cues   VVS on O2   Exercises     Shoulder Instructions      Home Living Family/patient expects to be discharged to:: Private residence Living Arrangements: Alone Available Help at Discharge: Family;Available PRN/intermittently Type of Home: House Home Access: Stairs to enter [employer] of Steps: 2 Entrance Stairs-Rails: Right Home Layout: Multi-level;Able to live on main level with bedroom/bathroom Alternate Level Stairs-Number of Steps: stays on main level   Bathroom Shower/Tub: Walk-in shower;Tub only   Bathroom Toilet: Standard Bathroom Accessibility: Yes How Accessible: Accessible via walker Home Equipment: Rolling Walker (2 wheels);Cane - single point;Hand held shower head          Prior Functioning/Environment Prior Level of Function : Independent/Modified Independent;Driving             Mobility Comments: Independent with PRN use of SPC, denies any other falls in the past 6 months, driving. ADLs Comments: Independent with bathing & dressing, preps his own meals, has someone that cleans his home, daughter assists with yardwork    OT Problem List: Decreased range of motion;Decreased activity tolerance;Impaired balance (sitting and/or standing);Decreased knowledge of use of DME or AE;Decreased safety awareness;Decreased cognition;Pain   OT Treatment/Interventions: Self-care/ADL training;Therapeutic activities;Cognitive remediation/compensation;Patient/family education;DME and/or AE instruction;Balance  training      OT Goals(Current goals can be found in the care plan section)   Acute Rehab OT Goals Patient Stated Goal: Return home OT Goal Formulation: With patient Time For Goal Achievement: 02/28/24 Potential to Achieve Goals: Good ADL Goals Pt Will Perform Grooming: with supervision;standing Pt Will Perform Lower Body Bathing: with min assist;sit to/from stand;sitting/lateral leans Pt Will Perform Lower Body Dressing: with min assist;sitting/lateral leans;sit to/from stand Pt Will Transfer to Toilet: with supervision;ambulating;regular height toilet   OT Frequency:  Min 2X/week    Co-evaluation              AM-PAC OT 6 Clicks Daily Activity     Outcome Measure Help from another person eating meals?: A Little Help from another person taking care of personal grooming?: A Little Help from another person toileting, which includes using toliet, bedpan, or urinal?: A Lot Help from another person bathing (including washing, rinsing, drying)?: A Lot Help from another person to put on and taking off regular upper body clothing?: A Little Help from another person to put on and taking off regular lower body clothing?: A Lot 6 Click Score: 15   End of Session Equipment Utilized During Treatment: Gait belt;Rolling walker (2 wheels) Nurse Communication: Mobility status  Activity Tolerance: Patient limited by pain Patient left: in bed;with call bell/phone within reach;with family/visitor present  OT Visit Diagnosis: Unsteadiness on feet (R26.81);Pain Pain - Right/Left: Left Pain - part of body: Hip                Time: 8654-8584  OT Time Calculation (min): 30 min Charges:  OT General Charges $OT Visit: 1 Visit OT Evaluation $OT Eval Moderate Complexity: 1 Mod OT Treatments $Self Care/Home Management : 8-22 mins  02/14/2024  RP, OTR/L  Acute Rehabilitation Services  Office:  [phone]   Charlie JONETTA Halsted 02/14/2024, 2:24 PM

## 2024-02-14 NOTE — Consult Note (Addendum)
 Reason for Consult:Pelvic fxs Referring Physician: Lavada Stank Time called: 1013 Time at bedside: 1142   [name] is an 83 y.o. male.  HPI: [name] fell at home a few days ago. He had immediate pelvic and LBP but was able to get up and function for a while until the pain became too bad. He was brought to the ED where x-rays showed multiple pelvic fxs and orthopedic surgery was consulted. He lives at home alone and generally uses a cane for ambulation.   Past Medical History:  Diagnosis Date   Ascending aorta dilatation (HCC)    44mm by chest CT angio 04/2018   CAD (coronary artery disease)    a. 05/2006 Abnl stress test w/ 2-41mm ST dep; b. 05/2006 Cath/PCI: LM nl, LAD 30-40ost, 20-30p, 90d (small), D1 nl, D2 nl, LCX nl, OM1/2 nl, RCA Ca2+, 46m/d (3.5x16 Liberty BMS & 3.5x12 Liberty BMS), EF 50%.   Chronic diastolic CHF (congestive heart failure) (HCC)    a. 10/2017 Echo: EF 50-55%, mild AI, sev dil LA, mod dil RA.   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 CHA2DS2VASc 5-->Eliquis ; b. Prev WCT on flecainide;  c. 12/2017 s/p DCCV.   Pneumothorax    Prolonged Q-T interval on ECG 06/22/2022   Ventricular tachycardia (HCC)    a. possibly proarrhythmia from flecainide.    Past Surgical History:  Procedure Laterality Date   CARDIOVERSION N/A 12/23/2017   Procedure: CARDIOVERSION;  Surgeon: Shlomo Wilbert SAUNDERS, MD;  Location: [facility] ENDOSCOPY;  Service: Cardiovascular;  Laterality: N/A;   CARDIOVERSION N/A 03/08/2018   Procedure: CARDIOVERSION;  Surgeon: Shlomo Wilbert SAUNDERS, MD;  Location: [facility] ENDOSCOPY;  Service: Cardiovascular;  Laterality: N/A;   INGUINAL HERNIA REPAIR     x2   left total hip surgery     TEE WITHOUT CARDIOVERSION N/A 03/08/2018   Procedure: TRANSESOPHAGEAL ECHOCARDIOGRAM (TEE);  Surgeon: Shlomo Wilbert SAUNDERS, MD;  Location: [facility] ENDOSCOPY;  Service: Cardiovascular;  Laterality:  N/A;   TOTAL HIP ARTHROPLASTY Right 08/19/2020   Procedure: TOTAL HIP ARTHROPLASTY ANTERIOR APPROACH;  Surgeon: Melodi Lerner, MD;  Location: [facility] ORS;  Service: Orthopedics;  Laterality: Right;    TOTAL KNEE ARTHROPLASTY Left 02/28/2018   Procedure: LEFT TOTAL KNEE ARTHROPLASTY;  Surgeon: Melodi Lerner, MD;  Location: [facility] ORS;  Service: Orthopedics;  Laterality: Left;   VEIN LIGATION AND STRIPPING Left 04/08/2020   Procedure: LEFT LOWER EXTREMITY VEIN LIGATION AND EXCISION OF ULCER;  Surgeon: Eliza Lonni RAMAN, MD;  Location: [facility] OR;  Service: Vascular;  Laterality: Left;    Family History  Problem Relation Age of Onset   Coronary artery disease Other     Social History:  reports that he has never smoked. He has never used smokeless tobacco. He reports that he does not currently use alcohol. He reports that he does not currently use drugs after having used the following drugs: Other-see comments.  Allergies: No Known Allergies  Medications: I have reviewed the patient's current medications.  Results for orders placed or performed during the hospital encounter of 02/12/24 (from the past 48 hours)  CBC with Differential     Status: Abnormal   Collection Time: 02/13/24  2:13 AM  Result Value Ref Range   WBC 10.3 4.0 - 10.5 K/uL   RBC 2.62 (L) 4.22 - 5.81 MIL/uL   Hemoglobin 6.3 (LL) 13.0 - 17.0 g/dL    Comment:  REPEATED TO VERIFY THIS CRITICAL RESULT HAS VERIFIED AND BEEN CALLED TO J. FERRAINOL RN BY MARY ALAMANO ON 07 27 2025 AT 0251, AND HAS BEEN READ BACK.     HCT 21.6 (L) 39.0 - 52.0 %   MCV 82.4 80.0 - 100.0 fL   MCH 24.0 (L) 26.0 - 34.0 pg   MCHC 29.2 (L) 30.0 - 36.0 g/dL   RDW 82.2 (H) 88.4 - 84.4 %   Platelets 183 150 - 400 K/uL   nRBC 0.5 (H) 0.0 - 0.2 %   Neutrophils Relative % 88 %   Neutro Abs 9.2 (H) 1.7 - 7.7 K/uL   Lymphocytes Relative 5 %   Lymphs Abs 0.5 (L) 0.7 - 4.0 K/uL   Monocytes Relative 6 %   Monocytes Absolute 0.6 0.1 - 1.0 K/uL   Eosinophils  Relative 0 %   Eosinophils Absolute 0.0 0.0 - 0.5 K/uL   Basophils Relative 0 %   Basophils Absolute 0.0 0.0 - 0.1 K/uL   Immature Granulocytes 1 %   Abs Immature Granulocytes 0.05 0.00 - 0.07 K/uL    Comment: Performed at [facility], [address], [place], [place] [address]  Comprehensive metabolic panel     Status: Abnormal   Collection Time: 02/13/24  2:13 AM  Result Value Ref Range   Sodium 140 135 - 145 mmol/L   Potassium 3.7 3.5 - 5.1 mmol/L   Chloride 108 98 - 111 mmol/L   CO2 22 22 - 32 mmol/L   Glucose, Bld 112 (H) 70 - 99 mg/dL    Comment: Glucose reference range applies only to samples taken after fasting for at least 8 hours.   BUN 20 8 - 23 mg/dL   Creatinine, Ser 8.89 0.61 - 1.24 mg/dL   Calcium  8.0 (L) 8.9 - 10.3 mg/dL   Total Protein 5.2 (L) 6.5 - 8.1 g/dL   Albumin 3.0 (L) 3.5 - 5.0 g/dL   AST 39 15 - 41 U/L   ALT 29 0 - 44 U/L   Alkaline Phosphatase 46 38 - 126 U/L   Total Bilirubin 0.8 0.0 - 1.2 mg/dL   GFR, Estimated >39 >39 mL/min    Comment: (NOTE) Calculated using the CKD-EPI Creatinine Equation (2021)    Anion gap 10 5 - 15    Comment: Performed at [facility], [address], [place], [place] [address]  Troponin I (High Sensitivity)     Status: Abnormal   Collection Time: 02/13/24  2:13 AM  Result Value Ref Range   Troponin I (High Sensitivity) 19 (H) <18 ng/L    Comment: (NOTE) Elevated high sensitivity troponin I (hsTnI) values and significant  changes across serial measurements may suggest ACS but many other  chronic and acute conditions are known to elevate hsTnI results.  Refer to the Links section for chest pain algorithms and additional  guidance. Performed at [facility], [address], [place], [place] [address]   Type and screen [facility]     Status: None (Preliminary result)   Collection Time: 02/13/24  3:15 AM  Result Value Ref Range   ABO/RH(D) O POS    Antibody Screen NEG    Sample  Expiration 02/16/2024,2359    Unit Number [id number]    Blood Component Type RED CELLS,LR    Unit division 00    Status of Unit ISSUED    Transfusion Status OK TO TRANSFUSE    Crossmatch Result Compatible  Unit Number [id number]    Blood Component Type RED CELLS,LR    Unit division 00    Status of Unit ISSUED    Transfusion Status OK TO TRANSFUSE    Crossmatch Result      Compatible Performed at [facility], [address] [place], [place] [address]   Prepare RBC (crossmatch)     Status: None   Collection Time: 02/13/24  3:15 AM  Result Value Ref Range   Order Confirmation      ORDER PROCESSED BY BLOOD BANK Performed at [facility], [address], [place], [place] [address]   Vitamin B12     Status: Abnormal   Collection Time: 02/13/24  3:30 AM  Result Value Ref Range   Vitamin B-12 119 (L) 180 - 914 pg/mL    Comment: (NOTE) This assay is not validated for testing neonatal or myeloproliferative syndrome specimens for Vitamin B12 levels. Performed at [facility], [address] [place], [place] [address]   Folate     Status: None   Collection Time: 02/13/24  3:30 AM  Result Value Ref Range   Folate 15.4 >5.9 ng/mL    Comment: Performed at [facility], [address] [place], [place] [address]  Reticulocytes     Status: Abnormal   Collection Time: 02/13/24  3:30 AM  Result Value Ref Range   Retic Ct Pct 1.8 0.4 - 3.1 %   RBC. 2.73 (L) 4.22 - 5.81 MIL/uL    Comment: RESULTS CONFIRMED BY MANUAL DILUTION   Retic Count, Absolute 46.5 19.0 - 186.0 K/uL   Immature Retic Fract 25.0 (H) 2.3 - 15.9 %    Comment: Performed at [facility], [address] [place], [place] [address]  POC occult blood, ED     Status: None   Collection Time: 02/13/24  4:00 AM  Result Value Ref Range   Fecal Occult Bld NEGATIVE NEGATIVE  Troponin I (High Sensitivity)     Status: Abnormal   Collection Time: 02/13/24  4:15 AM  Result Value Ref Range    Troponin I (High Sensitivity) 21 (H) <18 ng/L    Comment: (NOTE) Elevated high sensitivity troponin I (hsTnI) values and significant  changes across serial measurements may suggest ACS but many other  chronic and acute conditions are known to elevate hsTnI results.  Refer to the Links section for chest pain algorithms and additional  guidance. Performed at [facility], [address] [place], [place] [address]   Iron and TIBC     Status: Abnormal   Collection Time: 02/13/24  4:15 AM  Result Value Ref Range   Iron 34 (L) 45 - 182 ug/dL   TIBC 628 749 - 549 ug/dL   Saturation Ratios 9 (L) 17.9 - 39.5 %   UIBC 337 ug/dL    Comment: Performed at [facility], [address], [place], [place] [address]  Ferritin     Status: Abnormal   Collection Time: 02/13/24  4:15 AM  Result Value Ref Range   Ferritin 13 (L) 24 - 336 ng/mL    Comment: Performed at [facility], [address] [place], [place] [address]  Prepare RBC (crossmatch)     Status: None   Collection Time: 02/13/24  5:16 AM  Result Value Ref Range   Order Confirmation      ORDER PROCESSED BY BLOOD BANK Performed at [facility], [address], [place], [place] [address]   CBC     Status: Abnormal   Collection Time: 02/13/24  6:32 PM  Result Value Ref Range   WBC 7.7 4.0 - 10.5 K/uL   RBC 3.24 (L) 4.22 - 5.81 MIL/uL   Hemoglobin 8.3 (L) 13.0 - 17.0 g/dL    Comment: REPEATED TO VERIFY   HCT 27.1 (L) 39.0 - 52.0 %   MCV 83.6 80.0 - 100.0 fL   MCH 25.6 (L) 26.0 - 34.0 pg   MCHC 30.6 30.0 - 36.0 g/dL   RDW 82.9 (H) 88.4 - 84.4 %   Platelets 160 150 - 400 K/uL   nRBC 0.6 (H) 0.0 - 0.2 %    Comment: Performed at [facility], [address] [place], [place] [address]  CBC with Differential/Platelet     Status: Abnormal   Collection Time: 02/14/24  6:49 AM  Result Value Ref Range   WBC 11.3 (H) 4.0 - 10.5 K/uL   RBC 3.28 (L) 4.22 - 5.81 MIL/uL   Hemoglobin 8.6 (L) 13.0 - 17.0 g/dL    HCT 72.5 (L) 60.9 - 52.0 %   MCV 83.5 80.0 - 100.0 fL   MCH 26.2 26.0 - 34.0 pg   MCHC 31.4 30.0 - 36.0 g/dL   RDW 82.1 (H) 88.4 - 84.4 %   Platelets 145 (L) 150 - 400 K/uL   nRBC 0.4 (H) 0.0 - 0.2 %   Neutrophils Relative % 87 %   Neutro Abs 9.9 (H) 1.7 - 7.7 K/uL   Lymphocytes Relative 5 %   Lymphs Abs 0.5 (L) 0.7 - 4.0 K/uL   Monocytes Relative 6 %   Monocytes Absolute 0.7 0.1 - 1.0 K/uL   Eosinophils Relative 1 %   Eosinophils Absolute 0.1 0.0 - 0.5 K/uL   Basophils Relative 0 %   Basophils Absolute 0.0 0.0 - 0.1 K/uL   Immature Granulocytes 1 %   Abs Immature Granulocytes 0.07 0.00 - 0.07 K/uL    Comment: Performed at [facility], [address] [place], [place] [address]  Basic metabolic panel with GFR     Status: Abnormal   Collection Time: 02/14/24  6:49 AM  Result Value Ref Range   Sodium 140 135 - 145 mmol/L   Potassium 4.4 3.5 - 5.1 mmol/L   Chloride 108 98 - 111 mmol/L   CO2 21 (L) 22 - 32 mmol/L   Glucose, Bld 103 (H) 70 - 99 mg/dL    Comment: Glucose reference range applies only to samples taken after fasting for at least 8 hours.   BUN 19 8 - 23 mg/dL   Creatinine, Ser 8.98 0.61 - 1.24 mg/dL   Calcium  8.0 (L) 8.9 - 10.3 mg/dL   GFR, Estimated >39 >39 mL/min    Comment: (NOTE) Calculated using the CKD-EPI Creatinine Equation (2021)    Anion gap 11 5 - 15    Comment: Performed at [facility], [address], [place], [place] [address]  Magnesium      Status: None   Collection Time: 02/14/24  6:49 AM  Result Value Ref Range   Magnesium  2.1 1.7 - 2.4 mg/dL    Comment: Performed at [facility], [address] [place], [place] [address]    CT L-SPINE NO CHARGE Addendum Date: 02/13/2024 ADDENDUM REPORT: 02/13/2024 04:56 ADDENDUM: A transcription error resulted in replacement of the appropriate dictation for this examination. The findings and impression are as follows: Extensive multi-vessel  coronary artery calcification. Mild global  cardiomegaly. Small pericardial effusion. Central pulmonary arteries are enlarged in keeping with changes of pulmonary arterial hypertension. 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 Superimposed moderate atherosclerotic calcification of the thoracic aorta. Visualized thyroid  is unremarkable. No pathologic thoracic adenopathy. Esophagus is unremarkable. No mediastinal hematoma. No pneumomediastinum. Small bilateral pleural effusions are present, right greater than left. Mild bibasilar compressive atelectasis. No confluent pulmonary infiltrate. No pneumothorax. No central obstructing lesion. Thoracic dextroscoliosis noted. No acute bone abnormality within the thorax. Liver, gallbladder, pancreas, spleen, and left adrenal glands are unremarkable. 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 Severe left renal cortical atrophy the right kidney is normal in size and position. Multiple simple and mildly complex cortical cysts are seen within the left kidney for which no follow-up imaging is recommended. No hydronephrosis. No intrarenal or ureteral calculi. Streak artifact limits evaluation of the bladder. The bladder, however, appears distended and is markedly thick walled suggesting superimposed diffuse infectious or inflammatory cystitis. Mild distal colonic and cecal diverticulosis. Stomach, small bowel, and large bowel otherwise unremarkable. No free intraperitoneal gas or fluid. Appendix normal. Mild aortoiliac atherosclerotic calcification. No aortic aneurysm. No pathologic adenopathy within the abdomen and pelvis. 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 There are acute fractures of the left superior and inferior pubic rami as well as the right superior pubic ramus. Mild override  the left pubic rami fractures is in keeping with a lateral compression type injury. Subacute fracture of the right sacral ala with resorption along the fracture margin. There is infiltration within the space of Retzius surrounding the bladder in keeping with probable extraperitoneal hemorrhage adjacent to the left superior pubic symphyseal fracture. No definite active extravasation identified though imaging is limited by streak artifact. Impression; Acute fractures of the right superior and left superior and inferior pubic rami with mild displacement suggested a lateral compression type injury. Adjacent extraperitoneal hemorrhage within the space of Retzius without definite active extravasation identified. Circumferential bladder wall thickening suggestive of a diffuse infectious or inflammatory cystitis. Additional incidental findings as noted above. Electronically Signed   By: Dorethia Molt M.D.   On: 02/13/2024 04:56   Result Date: 02/13/2024 CLINICAL DATA:  Fall, low back pain EXAM: CT LUMBAR SPINE WITHOUT CONTRAST TECHNIQUE: Multidetector CT imaging of the lumbar spine was performed without intravenous contrast administration. Multiplanar CT image reconstructions were also generated. RADIATION DOSE REDUCTION: This exam was performed according to the departmental dose-optimization program which includes automated exposure control, adjustment of the mA and/or kV according to patient size and/or use of iterative reconstruction technique. COMPARISON:  None Available. 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 Alignment: Moderate lumbar levoscoliosis, apex left at L3. Normal lumbar lordosis. No listhesis. Vertebrae: No acute fracture of the lumbar spine; vertebral body height is preserved. No lytic or blastic bone lesion. Acute to subacute fracture of the right sacral ala noted with mild override of the fracture fragments suggestive of a lateral compression type injury.  Osseous structures are diffusely osteopenic. Paraspinal and other soft tissues: See accompanying report for CT examination of the chest, abdomen, and pelvis. No paraspinal fluid collection or inflammatory change identified. Disc levels: Intervertebral disc space narrowing and endplate remodeling at L3-S1 is present in keeping with changes of advanced degenerative disc  disease. Axial images demonstrate: L1-2: Unremarkable L2-3: Mild broad-based disc bulge and mild bilateral laminar hypertrophy result in mild central canal stenosis. No significant neuroforaminal narrowing. Mild bilateral facet arthrosis. L3-4: Posterior disc osteophyte complex. Mild bilateral facet arthrosis with associated hypertrophy. Resultant moderate central canal stenosis within the sub foraminal zone. No significant neuroforaminal narrowing. Impingement the right lateral recess with possible impingement of the crossing right L4 nerve root L4-5: Broad disc osteophyte complex, advanced bilateral facet arthrosis, associated facet hypertrophy, and laminar hypertrophy result in severe central canal stenosis within the sub foraminal zone. Mild right and moderate left neuroforaminal narrowing. Impingement of the right lateral recess probable impingement of the crossing right L5 nerve root. Possible impingement the crossing left L5 nerve root. L5-S1: Bilateral facet arthrosis, severe on the left. No significant neuroforaminal narrowing or canal stenosis. IMPRESSION: 1. Acute to subacute fracture of the right sacral ala with mild override of the fracture fragments suggestive of a lateral compression type injury. 2. No acute fracture or malalignment of the lumbar spine. 3. 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 4. Impingement of the right lateral recesses at L3-4 and L4-5 with possible impingement of the crossing right L4 and L5 nerve roots. 5.  Multilevel neuroforaminal narrowing, moderate on the left at L4-5. 6. 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 7. 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 Electronically Signed: By: Dorethia Molt M.D. On: 02/13/2024 04:26   CT T-SPINE NO CHARGE Addendum Date: 02/13/2024 ADDENDUM REPORT: 02/13/2024 04:56 ADDENDUM: A transcription error resulted in replacement of the appropriate dictation for this examination. The findings and impression are as follows: Extensive multi-vessel coronary artery calcification. Mild global cardiomegaly. Small pericardial effusion. Central pulmonary arteries are enlarged in keeping with changes of pulmonary arterial hypertension. 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 Superimposed moderate atherosclerotic calcification of the thoracic aorta. Visualized thyroid  is unremarkable. No pathologic thoracic adenopathy. Esophagus is unremarkable. No mediastinal hematoma. No pneumomediastinum. Small bilateral pleural effusions are present, right greater than left. Mild bibasilar compressive atelectasis. No confluent pulmonary infiltrate. No pneumothorax. No central obstructing lesion. Thoracic dextroscoliosis noted. No acute bone abnormality within the thorax. Liver, gallbladder, pancreas, spleen, and left adrenal glands are unremarkable. 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 Severe left renal cortical atrophy the right kidney is normal in size and position. Multiple simple and mildly complex cortical cysts are seen within the left kidney for which no follow-up imaging is recommended. No hydronephrosis. No intrarenal or ureteral calculi. Streak artifact limits evaluation of the bladder. The bladder, however, appears distended and is  markedly thick walled suggesting superimposed diffuse infectious or inflammatory cystitis. Mild distal colonic and cecal diverticulosis. Stomach, small bowel, and large bowel otherwise unremarkable. No free intraperitoneal gas or fluid. Appendix normal. Mild aortoiliac atherosclerotic calcification. No aortic aneurysm. No pathologic adenopathy within the abdomen and pelvis. 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 There are acute fractures of the left superior and inferior pubic rami as well as the right superior pubic ramus. Mild override the left pubic rami fractures is in keeping with a lateral compression type injury. Subacute fracture of the right sacral ala with resorption along the fracture margin. There is infiltration within the  space of Retzius surrounding the bladder in keeping with probable extraperitoneal hemorrhage adjacent to the left superior pubic symphyseal fracture. No definite active extravasation identified though imaging is limited by streak artifact. Impression; Acute fractures of the right superior and left superior and inferior pubic rami with mild displacement suggested a lateral compression type injury. Adjacent extraperitoneal hemorrhage within the space of Retzius without definite active extravasation identified. Circumferential bladder wall thickening suggestive of a diffuse infectious or inflammatory cystitis. Additional incidental findings as noted above. Electronically Signed   By: Dorethia Molt M.D.   On: 02/13/2024 04:56   Result Date: 02/13/2024 CLINICAL DATA:  Fall, low back pain EXAM: CT LUMBAR SPINE WITHOUT CONTRAST TECHNIQUE: Multidetector CT imaging of the lumbar spine was performed without intravenous contrast administration. Multiplanar CT image reconstructions were also generated. RADIATION DOSE REDUCTION: This exam was performed according to the departmental dose-optimization program which includes automated exposure control, adjustment of the mA and/or kV  according to patient size and/or use of iterative reconstruction technique. COMPARISON:  None Available. 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 Alignment: Moderate lumbar levoscoliosis, apex left at L3. Normal lumbar lordosis. No listhesis. Vertebrae: No acute fracture of the lumbar spine; vertebral body height is preserved. No lytic or blastic bone lesion. Acute to subacute fracture of the right sacral ala noted with mild override of the fracture fragments suggestive of a lateral compression type injury. Osseous structures are diffusely osteopenic. Paraspinal and other soft tissues: See accompanying report for CT examination of the chest, abdomen, and pelvis. No paraspinal fluid collection or inflammatory change identified. Disc levels: Intervertebral disc space narrowing and endplate remodeling at L3-S1 is present in keeping with changes of advanced degenerative disc disease. Axial images demonstrate: L1-2: Unremarkable L2-3: Mild broad-based disc bulge and mild bilateral laminar hypertrophy result in mild central canal stenosis. No significant neuroforaminal narrowing. Mild bilateral facet arthrosis. L3-4: Posterior disc osteophyte complex. Mild bilateral facet arthrosis with associated hypertrophy. Resultant moderate central canal stenosis within the sub foraminal zone. No significant neuroforaminal narrowing. Impingement the right lateral recess with possible impingement of the crossing right L4 nerve root L4-5: Broad disc osteophyte complex, advanced bilateral facet arthrosis, associated facet hypertrophy, and laminar hypertrophy result in severe central canal stenosis within the sub foraminal zone. Mild right and moderate left neuroforaminal narrowing. Impingement of the right lateral recess probable impingement of the crossing right L5 nerve root. Possible impingement the crossing left L5 nerve root. L5-S1: Bilateral facet arthrosis, severe on the left. No  significant neuroforaminal narrowing or canal stenosis. IMPRESSION: 1. Acute to subacute fracture of the right sacral ala with mild override of the fracture fragments suggestive of a lateral compression type injury. 2. No acute fracture or malalignment of the lumbar spine. 3. 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 4. Impingement of the right lateral recesses at L3-4 and L4-5 with possible impingement of the crossing right L4 and L5 nerve roots. 5. Multilevel neuroforaminal narrowing, moderate on the left at L4-5. 6. 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 7. 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 Electronically Signed: By: Dorethia Molt M.D. On: 02/13/2024 04:26   CT CHEST ABDOMEN PELVIS W CONTRAST Addendum Date: 02/13/2024 ADDENDUM REPORT: 02/13/2024 04:56 ADDENDUM: A transcription error resulted in replacement of the appropriate dictation for this examination. The findings and impression are as follows: Extensive multi-vessel coronary artery calcification. Mild global cardiomegaly. Small  pericardial effusion. Central pulmonary arteries are enlarged in keeping with changes of pulmonary arterial hypertension. 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 Superimposed moderate atherosclerotic calcification of the thoracic aorta. Visualized thyroid  is unremarkable. No pathologic thoracic adenopathy. Esophagus is unremarkable. No mediastinal hematoma. No pneumomediastinum. Small bilateral pleural effusions are present, right greater than left. Mild bibasilar compressive atelectasis. No confluent pulmonary infiltrate. No pneumothorax. No central obstructing lesion. Thoracic dextroscoliosis noted. No acute bone abnormality within the thorax. Liver, gallbladder, pancreas, spleen,  and left adrenal glands are unremarkable. 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 Severe left renal cortical atrophy the right kidney is normal in size and position. Multiple simple and mildly complex cortical cysts are seen within the left kidney for which no follow-up imaging is recommended. No hydronephrosis. No intrarenal or ureteral calculi. Streak artifact limits evaluation of the bladder. The bladder, however, appears distended and is markedly thick walled suggesting superimposed diffuse infectious or inflammatory cystitis. Mild distal colonic and cecal diverticulosis. Stomach, small bowel, and large bowel otherwise unremarkable. No free intraperitoneal gas or fluid. Appendix normal. Mild aortoiliac atherosclerotic calcification. No aortic aneurysm. No pathologic adenopathy within the abdomen and pelvis. 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 There are acute fractures of the left superior and inferior pubic rami as well as the right superior pubic ramus. Mild override the left pubic rami fractures is in keeping with a lateral compression type injury. Subacute fracture of the right sacral ala with resorption along the fracture margin. There is infiltration within the space of Retzius surrounding the bladder in keeping with probable extraperitoneal hemorrhage adjacent to the left superior pubic symphyseal fracture. No definite active extravasation identified though imaging is limited by streak artifact. Impression; Acute fractures of the right superior and left superior and inferior pubic rami with mild displacement suggested a lateral compression type injury. Adjacent extraperitoneal hemorrhage within the space of Retzius without definite active extravasation identified. Circumferential bladder wall thickening suggestive of a diffuse infectious or inflammatory cystitis.  Additional incidental findings as noted above. Electronically Signed   By: Dorethia Molt M.D.   On: 02/13/2024 04:56   Result Date: 02/13/2024 CLINICAL DATA:  Fall, low back pain EXAM: CT LUMBAR SPINE WITHOUT CONTRAST TECHNIQUE: Multidetector CT imaging of the lumbar spine was performed without intravenous contrast administration. Multiplanar CT image reconstructions were also generated. RADIATION DOSE REDUCTION: This exam was performed according to the departmental dose-optimization program which includes automated exposure control, adjustment of the mA and/or kV according to patient size and/or use of iterative reconstruction technique. COMPARISON:  None Available. 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 Alignment: Moderate lumbar levoscoliosis, apex left at L3. Normal lumbar lordosis. No listhesis. Vertebrae: No acute fracture of the lumbar spine; vertebral body height is preserved. No lytic or blastic bone lesion. Acute to subacute fracture of the right sacral ala noted with mild override of the fracture fragments suggestive of a lateral compression type injury. Osseous structures are diffusely osteopenic. Paraspinal and other soft tissues: See accompanying report for CT examination of the chest, abdomen, and pelvis. No paraspinal fluid collection or inflammatory change identified. Disc levels: Intervertebral disc space narrowing and endplate remodeling at L3-S1 is present in keeping with changes of advanced degenerative disc disease. Axial images demonstrate: L1-2: Unremarkable L2-3: Mild  broad-based disc bulge and mild bilateral laminar hypertrophy result in mild central canal stenosis. No significant neuroforaminal narrowing. Mild bilateral facet arthrosis. L3-4: Posterior disc osteophyte complex. Mild bilateral facet arthrosis with associated hypertrophy. Resultant moderate central canal stenosis within the sub foraminal zone. No significant neuroforaminal  narrowing. Impingement the right lateral recess with possible impingement of the crossing right L4 nerve root L4-5: Broad disc osteophyte complex, advanced bilateral facet arthrosis, associated facet hypertrophy, and laminar hypertrophy result in severe central canal stenosis within the sub foraminal zone. Mild right and moderate left neuroforaminal narrowing. Impingement of the right lateral recess probable impingement of the crossing right L5 nerve root. Possible impingement the crossing left L5 nerve root. L5-S1: Bilateral facet arthrosis, severe on the left. No significant neuroforaminal narrowing or canal stenosis. IMPRESSION: 1. Acute to subacute fracture of the right sacral ala with mild override of the fracture fragments suggestive of a lateral compression type injury. 2. No acute fracture or malalignment of the lumbar spine. 3. 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 4. Impingement of the right lateral recesses at L3-4 and L4-5 with possible impingement of the crossing right L4 and L5 nerve roots. 5. Multilevel neuroforaminal narrowing, moderate on the left at L4-5. 6. 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 7. 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 Electronically Signed: By: Dorethia Molt M.D. On: 02/13/2024 04:26   CT Cervical Spine Wo Contrast Result Date: 02/13/2024 EXAM: CT CERVICAL SPINE WITHOUT CONTRAST 02/13/2024 03:58:59 AM TECHNIQUE: CT of the cervical spine was performed without the administration of intravenous contrast. Multiplanar reformatted images are provided for review. Automated exposure control, iterative reconstruction, and/or weight based adjustment of the mA/kV was utilized to reduce the radiation dose to as low as reasonably achievable. COMPARISON: None available. CLINICAL HISTORY: Neck trauma (Age >= 65y). No contrast. Lower back pain from a  mechanical fall this afternoon. Didn't hit his head. No LOC. Takes eliquis . FINDINGS: CERVICAL SPINE: BONES AND ALIGNMENT: Reversal of the normal cervical lordosis is stable. Grade 1 anterolisthesis at C3-4 is stable. DEGENERATIVE CHANGES: Endplate degenerative changes and uncovertebral spurring results in moderate foraminal stenosis bilaterally at C4-5 and C5-6 and on the right at C6-7. SOFT TISSUES: No prevertebral soft tissue swelling. VASCULATURE: Atherosclerotic changes are present with carotid bifurcations bilaterally without definite stenosis. PLEURAL SPACES: Right pleural effusion and chronic right pleural calcifications are again noted. IMPRESSION: 1. No acute abnormality of the cervical spine related to the reported neck trauma. 2. Stable reversal of the normal cervical lordosis and grade 1 anterolisthesis at C3-4. 3. 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 Electronically signed by: Lonni Necessary MD 02/13/2024 04:19 AM EDT RP Workstation: [id number]   CT Head Wo Contrast Result Date: 02/13/2024 EXAM: CT HEAD WITHOUT CONTRAST 02/13/2024 03:58:59 AM TECHNIQUE: CT of the head was performed without the administration of intravenous contrast. Automated exposure control, iterative reconstruction, and/or weight based adjustment of the mA/kV was utilized to reduce the radiation dose to as low as reasonably achievable. COMPARISON: CT head without contrast 11/18/2023. CLINICAL HISTORY: Head trauma, moderate-severe. No contrast. Lower back pain from a mechanical fall this afternoon. Didn't hit his head. No LOC. Takes eliquis . FINDINGS: BRAIN AND VENTRICLES: No acute hemorrhage. Gray-white differentiation is preserved. No hydrocephalus. No extra-axial collection. No mass effect or midline shift. Mild atrophy and white  matter changes are similar to prior study. ORBITS: No acute abnormality. SINUSES: No  acute abnormality. SOFT TISSUES AND SKULL: No acute soft tissue abnormality. No skull fracture. VASCULATURE: Atherosclerotic calcifications are present in the cavernous carotid arteries bilaterally and at the dural margin of both vertebral arteries. No hyperdense vessel is present. IMPRESSION: 1. No acute intracranial abnormality. 2. Mild atrophy and white matter changes, similar to prior study. 3. Atherosclerosis Electronically signed by: Lonni Necessary MD 02/13/2024 04:16 AM EDT RP Workstation: [id number]   DG Chest Portable 1 View Result Date: 02/13/2024 CLINICAL DATA:  Recent fall with chest pain, initial encounter EXAM: PORTABLE CHEST 1 VIEW COMPARISON:  11/18/2023 FINDINGS: Cardiac shadow is enlarged. Aortic calcifications are noted. The lungs are well aerated without focal infiltrate. Chronic scarring in the right base is seen. No pneumothorax is noted. No acute bony abnormality is seen. IMPRESSION: Chronic changes without acute abnormality. Electronically Signed   By: Oneil Devonshire M.D.   On: 02/13/2024 00:08   DG Elbow Complete Right Result Date: 02/13/2024 CLINICAL DATA:  Recent fall with right elbow pain, initial encounter EXAM: RIGHT ELBOW - COMPLETE 3+ VIEW COMPARISON:  None Available. FINDINGS: There is no evidence of fracture, dislocation, or joint effusion. There is no evidence of arthropathy or other focal bone abnormality. Soft tissues are unremarkable. IMPRESSION: No acute abnormality noted. Electronically Signed   By: Oneil Devonshire M.D.   On: 02/13/2024 00:08   DG Knee Right Port Result Date: 02/13/2024 CLINICAL DATA:  Recent fall with right knee pain, initial encounter EXAM: PORTABLE RIGHT KNEE - 2 VIEW COMPARISON:  08/31/2023 FINDINGS: Degenerative changes are noted in the medial joint space. Diffuse soft tissue swelling is noted. No acute fracture or dislocation is identified. No joint effusion is seen. IMPRESSION: Degenerative change without acute abnormality. Electronically  Signed   By: Oneil Devonshire M.D.   On: 02/13/2024 00:07   DG Knee Left Port Result Date: 02/13/2024 CLINICAL DATA:  Recent fall with knee pain, initial encounter EXAM: PORTABLE LEFT KNEE - 2 VIEW COMPARISON:  None Available. 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 No acute fracture or dislocation is noted. No joint effusion is seen. Diffuse vascular calcifications are noted. IMPRESSION: No acute abnormality noted. Electronically Signed   By: Oneil Devonshire M.D.   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 Lucency is noted over the superior pubic rami bilaterally suspicious for undisplaced fractures. CT would be helpful for further evaluation. No other bony abnormality is seen. IMPRESSION: Findings suspicious for bilateral superior pubic rami fractures. CT would be helpful in this regard. Electronically Signed   By: Oneil Devonshire M.D.   On: 02/13/2024 00:06    Review of Systems  HENT:  Negative for ear discharge, ear pain, hearing loss and tinnitus.   Eyes:  Negative for photophobia and pain.  Respiratory:  Negative for cough and shortness of breath.   Cardiovascular:  Negative for chest pain.  Gastrointestinal:  Negative for abdominal pain, nausea and vomiting.  Genitourinary:  Negative for dysuria, flank pain, frequency and urgency.  Musculoskeletal:  Positive for arthralgias (Pelvis) and back pain. Negative for myalgias and neck pain.  Neurological:  Negative for dizziness and headaches.  Hematological:  Does not bruise/bleed easily.  Psychiatric/Behavioral:  The patient is not nervous/anxious.    Blood pressure 127/87, pulse 86, temperature 98.8  F (37.1 C), temperature source Oral, resp. rate 17, height 6' 2 (1.88 m), weight 69 kg, SpO2 94%. Physical Exam Constitutional:      General: He is  not in acute distress.    Appearance: He is well-developed. He is not diaphoretic.  HENT:     Head: Normocephalic and atraumatic.  Eyes:     General: No scleral icterus.       Right eye: No discharge.        Left eye: No discharge.     Conjunctiva/sclera: Conjunctivae normal.  Cardiovascular:     Rate and Rhythm: Normal rate and regular rhythm.  Pulmonary:     Effort: Pulmonary effort is normal. No respiratory distress.  Musculoskeletal:     Cervical back: Normal range of motion.     Comments: Pelvis--no traumatic wounds or rash, no ecchymosis, stable to manual stress, nontender  BLE No traumatic wounds, ecchymosis, or rash  Nontender  No knee or ankle effusion  Knee stable to varus/ valgus and anterior/posterior stress  Sens DPN, SPN, TN intact  Motor EHL, ext, flex, evers 5/5  DP 2+, PT 1+, No significant edema  Skin:    General: Skin is warm and dry.  Neurological:     Mental Status: He is alert.  Psychiatric:        Mood and Affect: Mood normal.        Behavior: Behavior normal.     Assessment/Plan: Pelvic fxs -- He's been up with PT here and did well so I think he'll do just fine with non-operative management and WBAT BLE. F/u with Dr. Barton in 2-3 weeks.    Ozell DOROTHA Ned, PA-C Orthopedic Surgery [phone] 02/14/2024, 11:51 AM   Agree with above. Pt mobilized well with PT. Reports it actually feels much better to weightbear and ambulate on this. When in bed, he is pushing off against resistance with both feet on footboard. Reports that it feels great to do this. Denies numbness/tingling. Exam shows no new findings. Will plan on WBAT with walker at all times and 1-2 person assist. PT/OT to work with him on gait/balance training. Follow up as outpatient within 10-14 days from discharge for weightbearing pelvis AP inlet outlet films.  Lillia Barton, MD Orthopaedic Surgery [facility]

## 2024-02-14 NOTE — NC FL2 (Signed)
 [place]  MEDICAID FL2 LEVEL OF CARE FORM     IDENTIFICATION  Patient Name: [name] Birthdate: July 09, 1941 Sex: male Admission Date (Current Location): 02/12/2024  [facility] and [place] Number:  Producer, television/film/video and Address:  [facility] [place], [address] [place], [place] [address]      Provider Number: [id number]  Attending Physician Name and Address:  Dennise Lavada POUR, MD  Relative Name and Phone Number:       Current Level of Care: Hospital Recommended Level of Care: Skilled Nursing Facility Prior Approval Number:    Date Approved/Denied:   PASRR Number: [id number]  Discharge Plan: SNF    Current Diagnoses: Patient Active Problem List   Diagnosis Date Noted   Fall 02/13/2024   Symptomatic anemia 03/31/2023   Acute urinary retention 03/31/2023   Cellulitis 03/25/2023   Chronic diastolic CHF (congestive heart failure) (HCC)    PAF (paroxysmal atrial fibrillation) (HCC)    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   Witnessed seizure-like activity (HCC) 02/28/2023   ABLA (acute blood loss anemia), symptomatic 02/28/2023   Acute respiratory failure with hypoxia (HCC) 02/28/2023   Laceration of right foot 02/28/2023   Seizure-like activity (HCC) 02/28/2023   Prolonged Q-T interval on ECG 06/22/2022   Near syncope 12/17/2021   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   OA (osteoarthritis) of hip 08/19/2020   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   Bilateral lower extremity edema 05/24/2018   Persistent atrial fibrillation (HCC) 03/16/2018   Thoracic aortic aneurysm (HCC)    (HFpEF) heart failure with preserved ejection fraction (HCC)    CAD (coronary artery disease) 06/26/2013   Ventricular tachycardia (HCC)    Chronic venous insufficiency    Hyperlipidemia LDL goal <70 06/08/2011   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   HTN (hypertension) 05/09/2009    Orientation RESPIRATION BLADDER  Height & Weight     Self  O2 (2L nasal cannula) Continent, Indwelling catheter Weight: 152 lb 1.9 oz (69 kg) Height:  6' 2 (188 cm)  BEHAVIORAL SYMPTOMS/MOOD NEUROLOGICAL BOWEL NUTRITION STATUS      Continent Diet (See dc summary)  AMBULATORY STATUS COMMUNICATION OF NEEDS Skin   Extensive Assist Verbally Normal                       Personal Care Assistance Level of Assistance  Bathing, Feeding, Dressing Bathing Assistance: Maximum assistance Feeding assistance: Limited assistance Dressing Assistance: Limited assistance     Functional Limitations Info             SPECIAL CARE FACTORS FREQUENCY  PT (By licensed PT), OT (By licensed OT)     PT Frequency: 5x/week-WBAT OT Frequency: 5x/week            Contractures Contractures Info: Not present    Additional Factors Info  Code Status, Allergies Code Status Info: Full Allergies Info: NKA           Current Medications (02/14/2024):  This is the current hospital active medication list Current Facility-Administered Medications  Medication Dose Route Frequency Provider Last Rate Last Admin   0.9 %  sodium chloride  infusion   Intravenous Continuous Singh, Prashant K, MD 50 mL/hr at 02/14/24 0611 Rate Change at 02/14/24 0611   acetaminophen  (TYLENOL ) tablet 1,000 mg  1,000 mg Oral Q8H Dasie Leonor CROME, MD   1,000 mg at 02/14/24 0516   amiodarone  (PACERONE ) tablet 200 mg  200 mg Oral Daily Georgina Basket, MD  200 mg at 02/14/24 9061   atorvastatin  (LIPITOR) tablet 20 mg  20 mg Oral Daily Georgina Basket, MD   20 mg at 02/14/24 9061   carvedilol  (COREG ) tablet 6.25 mg  6.25 mg Oral BID Georgina Basket, MD   6.25 mg at 02/14/24 9061   Chlorhexidine  Gluconate Cloth 2 % PADS 6 each  6 each Topical Daily Georgina Basket, MD   6 each at 02/14/24 1226   multivitamin with minerals tablet 1 tablet  1 tablet Oral Daily Georgina Basket, MD   1 tablet at 02/14/24 9061   oxyCODONE  (Oxy IR/ROXICODONE ) immediate release tablet 2.5-5 mg   2.5-5 mg Oral Q4H PRN Dasie Leonor CROME, MD   5 mg at 02/14/24 0516   polyethylene glycol (MIRALAX  / GLYCOLAX ) packet 17 g  17 g Oral Daily PRN Shona Terry SAILOR, DO       prochlorperazine  (COMPAZINE ) injection 5 mg  5 mg Intravenous Q6H PRN Shona Terry SAILOR, DO       QUEtiapine  (SEROQUEL ) tablet 25 mg  25 mg Oral QHS Singh, Prashant K, MD       tamsulosin  (FLOMAX ) capsule 0.4 mg  0.4 mg Oral Daily Singh, Prashant K, MD   0.4 mg at 02/14/24 1226     Discharge Medications: Please see discharge summary for a list of discharge medications.  Relevant Imaging Results:  Relevant Lab Results:   Additional Information SSN : [id number]  [name], LCSW

## 2024-02-14 NOTE — Evaluation (Addendum)
 Physical Therapy Evaluation Patient Details Name: [name] MRN: [id number] DOB: 04/29/1941 Today's Date: 02/14/2024  History of Present Illness  Pt is an 83 y/o M admitted on 02/12/24 after presenting following a fall. Pt found to have bilateral pubic rami fx & adjacent hematoma, as well as symptomatic anemia. PMH: CAD, HFpEF, PAF, HTN, R THA  Clinical Impression  Pt seen for PT evaluation with pt awakened & agreeable to tx, very pleasant throughout session. Pt reports prior to admission he was independent with PRN use of SPC, living alone, driving, denies any other falls & does not recall this one. On this date, pt c/o L hip/groin pain & back pain but is able to complete bed mobility with min assist, sit>stand with max assist, & step pivot with RW & min assist. Pt politely deferred walking 2/2 pain at this time. Anticipate pt will progress well once pain improves. At this time, recommend post acute rehab <3 hours therapy/day upon d/c.        If plan is discharge home, recommend the following: A lot of help with walking and/or transfers;A lot of help with bathing/dressing/bathroom;Assist for transportation;Assistance with cooking/housework;Help with stairs or ramp for entrance   Can travel by private vehicle   No    Equipment Recommendations Rolling walker (2 wheels);BSC/3in1  Recommendations for Other Services  Rehab consult    Functional Status Assessment Patient has had a recent decline in their functional status and demonstrates the ability to make significant improvements in function in a reasonable and predictable amount of time.     Precautions / Restrictions Precautions Precautions: Fall Restrictions Weight Bearing Restrictions Per Provider Order: Yes RLE Weight Bearing Per Provider Order: Weight bearing as tolerated LLE Weight Bearing Per Provider Order: Weight bearing as tolerated      Mobility  Bed Mobility Overal bed mobility: Needs Assistance Bed Mobility:  Supine to Sit     Supine to sit: Min assist, HOB elevated, Used rails (exit L side of bed)          Transfers Overall transfer level: Needs assistance Equipment used: Rolling walker (2 wheels) Transfers: Sit to/from Stand, Bed to chair/wheelchair/BSC Sit to Stand: Max assist, From elevated surface   Step pivot transfers: Min assist (extra time)       General transfer comment: Pt able to push to standing with mod assist but requires max assist for balance when transitioning BUE from pushing EOB to RW, pt with posterior lean at this time.    Ambulation/Gait                  Stairs            Wheelchair Mobility     Tilt Bed    Modified Rankin (Stroke Patients Only)       Balance Overall balance assessment: Needs assistance Sitting-balance support: Feet supported Sitting balance-Leahy Scale: Fair Sitting balance - Comments: supervision static sitting EOB   Standing balance support: Reliant on assistive device for balance, Bilateral upper extremity supported, During functional activity Standing balance-Leahy Scale: Fair                               Pertinent Vitals/Pain Pain Assessment Pain Assessment: Faces Faces Pain Scale: Hurts whole lot Pain Location: L groin/hip, back Pain Descriptors / Indicators: Discomfort, Grimacing, Guarding Pain Intervention(s): Monitored during session, Repositioned, Limited activity within patient's tolerance    Home Living Family/patient expects to be  discharged to:: Private residence Living Arrangements: Alone Available Help at Discharge: Family;Available PRN/intermittently Type of Home: House Home Access: Stairs to enter Entrance Stairs-Rails: Right Entrance Stairs-Number of Steps: 2   Home Layout: Multi-level;Able to live on main level with bedroom/bathroom (full basement, main level, upstairs) Home Equipment: Rolling Walker (2 wheels);Cane - single point;Hand held shower head      Prior  Function Prior Level of Function : Independent/Modified Independent;Driving             Mobility Comments: Independent with PRN use of SPC, denies any other falls in the past 6 months, driving. ADLs Comments: Independent with bathing & dressing, preps his own meals, has someone that cleans his home, daughter assists with yardwark     Extremity/Trunk Assessment   Upper Extremity Assessment Upper Extremity Assessment: Overall WFL for tasks assessed;Left hand dominant    Lower Extremity Assessment Lower Extremity Assessment:  (limited 2/2 pain)       Communication   Communication Communication: No apparent difficulties    Cognition Arousal: Alert Behavior During Therapy: WFL for tasks assessed/performed   PT - Cognitive impairments: No family/caregiver present to determine baseline, Orientation, Memory, Problem solving, Safety/Judgement   Orientation impairments: Place, Situation                   PT - Cognition Comments: Not oriented to city/location Following commands: Impaired Following commands impaired: Follows one step commands with increased time     Cueing Cueing Techniques: Verbal cues     General Comments General comments (skin integrity, edema, etc.): Pt received with nasal cannula out of nose, SpO2 90-92% so removed cannula. When pt sat EOB pt with reading of 75% but with inconsistent, inaccurate pleth waveform but pt c/o feeling a little breathy so pt replaced nasal cannula in pt's nose, 3L/min & switched out finger probe wiht little to no improvement. SpO2 92% on 3L/min at end of session, pt sitting in recliner. - MD & nurse made aware    Exercises     Assessment/Plan    PT Assessment Patient needs continued PT services  PT Problem List Decreased strength;Decreased range of motion;Decreased cognition;Decreased coordination;Cardiopulmonary status limiting activity;Pain;Decreased activity tolerance;Decreased knowledge of use of DME;Decreased  balance;Decreased safety awareness;Decreased mobility       PT Treatment Interventions DME instruction;Balance training;Modalities;Gait training;Neuromuscular re-education;Stair training;Functional mobility training;Therapeutic activities;Patient/family education;Therapeutic exercise    PT Goals (Current goals can be found in the Care Plan section)  Acute Rehab PT Goals Patient Stated Goal: decreased pain, get better PT Goal Formulation: With patient Time For Goal Achievement: 02/28/24 Potential to Achieve Goals: Good    Frequency Min 3X/week     Co-evaluation               AM-PAC PT 6 Clicks Mobility  Outcome Measure Help needed turning from your back to your side while in a flat bed without using bedrails?: A Little Help needed moving from lying on your back to sitting on the side of a flat bed without using bedrails?: A Lot Help needed moving to and from a bed to a chair (including a wheelchair)?: A Little Help needed standing up from a chair using your arms (e.g., wheelchair or bedside chair)?: Total Help needed to walk in hospital room?: Total Help needed climbing 3-5 steps with a railing? : Total 6 Click Score: 11    End of Session Equipment Utilized During Treatment: Gait belt Activity Tolerance: Patient tolerated treatment well;Patient limited by pain Patient left: in  chair;with chair alarm set;with call bell/phone within reach (reviewed use of call bell with pt) Nurse Communication: Mobility status (O2 readings during session) PT Visit Diagnosis: Pain;Unsteadiness on feet (R26.81);Difficulty in walking, not elsewhere classified (R26.2);Other abnormalities of gait and mobility (R26.89);Muscle weakness (generalized) (M62.81)    Time: 9178-9142 PT Time Calculation (min) (ACUTE ONLY): 36 min   Charges:   PT Evaluation $PT Eval Low Complexity: 1 Low   PT General Charges $$ ACUTE PT VISIT: 1 Visit         [name], PT, DPT 02/14/24, 9:10  AM   [name] 02/14/2024, 9:08 AM

## 2024-02-14 NOTE — TOC Initial Note (Addendum)
 Transition of Care [facility]) - Initial/Assessment Note    Patient Details  Name: [name] MRN: [id number] Date of Birth: 09/30/1940  Transition of [facility]) CM/SW Contact:    Inocente GORMAN Kindle, LCSW Phone Number: 02/14/2024, 12:25 PM  Clinical Narrative:                 12:25pm-CSW received consult for possible SNF placement at time of discharge. CSW spoke with patient's daughter who lives in [place]. She reported that patient lives alone and her other sister lives in [place]. She expressed understanding of PT recommendation and is agreeable to SNF placement at time of discharge. CSW discussed insurance authorization process and will provide Medicare SNF ratings list. CSW will send out referrals for review and provide bed offers as available.   3:27 PM-CSW met with patient's daughter, [name], and provided SNF bed offers and ratings list. CSW answered patient's questions. [name] inquired about [facility] or [employer] (may be private pay only) in [place] as well.   3:40 PM-CSW sent referral to [facility].    Skilled Nursing Rehab Facilities-   [email or web address]   Ratings out of 5 stars (5 the highest)  Name Address  Phone # Quality Care Staffing Health Inspection Overall  [facility] [address] [phone] 3 1 4 3   [facility] [address], [place] [phone] 5 2 4 5   [facility] [address], [place] [phone] 2 1 1 1   [facility] [address] [place] [phone] 4 3 4 4   [facility]  [address], [place] [phone] 5 3 5 5   [facility] [address] [facility] [phone] 5 3 2 3   [facility] [address], [place] [phone] 5 1 2 2   [facility] [phone] [address] [place] [phone] 2 3 4 4   [address] ([place]) [address], [place] [phone] 1 3 3 2   [facility] [address] [place], [place]  [phone] 3 1 2 1   [facility] ([place]) [address], [place] [phone] 3 1 1 1   Clotilda Pereyra [address] [place] [phone] 3 3 4 4   [facility] [address] [place] [phone] 4 1 2 1   [facility] ([facility]) [address], [place] [phone] 1 1 4 2           [facility] [address] [place] [phone] 3 1 5 4   [facility] [address], [place] [phone] 4 1 1 1   [facility]  [address] [place] [phone] 2 3 1 1   [facility] [address] [phone] 2 2 4 4   [facility] [address], [place] [phone] 2 2 3 3           [facility] [address] [place] [phone] 2 1 2 1   [facility] (No [facility]) [address], [place] [phone] 5 4 4 5   [facility] [address], [facility] [phone] 3 4 4 4   [facility] [address] [place] [phone] 3 1 2 1   [place] [address] [place] [phone] 3 2 2 2   [facility] [address], [place] [phone] 1 3 3 2   [facility] [address] [place] [phone] 2 2 3 3   [facility] [address] [place], [place] [phone] 2 1 4 3   [facility] [address], [place] [phone] 2 1  1 1  [facility] [address] [place], [place] [phone] 2 1 2 1   [facility] [address] [place] [phone] 3 1 2 1           [facility] [address] [place] [phone] 4 1 2 1   [facility] [address], [place]  [phone] 3 4 4 4   [facility] (No [employer]) [address], [place] [phone] 3 2 5 5   [facility]) [address], [place] [phone] 2 2 3 3   [facility] [address], [place] [phone] 5 3 5 5   [facility] [address], [place] [phone] 2 1 1 1   [facility] [address] [place], [place] [phone] 3 5 5 5           [facility] [address] [place], [place] [phone] 5 4 5 5   [facility])  [address], [place] [phone] 1 1 2 1   [facility]) [address], [place] [phone]  2 4 4   [facility] [address], [place] [phone] 3 5 5 5   [address] [place], [place] [phone] 4 2 2 2   [facility]) [address] [place] [phone] 1 1 3 1   [facility] [address] [place] [phone] 2 2 2 2      Expected Discharge Plan: Skilled Nursing Facility Barriers to Discharge: Continued Medical Work up, English as a second language teacher, SNF Pending bed offer   Patient Goals and CMS Choice Patient states their goals for this hospitalization and ongoing recovery are:: Rehab CMS Medicare.gov Compare Post Acute Care list provided to:: Patient Represenative (must comment) Choice offered to / list presented to : Adult Children [place] ownership interest in [facility].provided to:: Adult Children    Expected Discharge Plan and Services In-house Referral: Clinical Social Work   Post Acute Care Choice: Skilled Nursing Facility Living arrangements for the past 2 months: Single Family Home                                      Prior Living Arrangements/Services Living arrangements for the past 2 months: Single Family Home Lives with:: Self Patient language and need for interpreter reviewed:: Yes Do you feel safe going back to the place where you live?: Yes      Need for Family Participation in Patient Care: Yes (Comment) Care giver support system in place?: Yes (comment) Current home services: DME (cane) Criminal Activity/Legal Involvement Pertinent to Current Situation/Hospitalization: No - Comment as needed  Activities of Daily Living   ADL Screening (condition at time of  admission) Independently performs ADLs?: No Does the patient have a NEW difficulty with bathing/dressing/toileting/self-feeding that is expected to last >3 days?: Yes (Initiates electronic notice to provider for possible OT consult) Does the patient have a NEW difficulty with getting in/out of bed, walking, or climbing stairs that is expected to last >3 days?: Yes (Initiates electronic notice to provider for possible PT consult) Does the patient have a NEW difficulty with communication that is expected to last >3 days?: No Is the patient deaf or have difficulty hearing?: No Does the patient have difficulty seeing, even when wearing glasses/contacts?: No Does the patient have difficulty concentrating, remembering, or making decisions?: No  Permission Sought/Granted Permission sought to share information with : Facility  Contact Representative, Family Supports Permission granted to share information with : No  Share Information with NAME: Matilde Lyla Dragon- Daughter [phone]  Permission granted to share info w AGENCY: SNFs        Emotional Assessment Appearance:: Appears stated age Attitude/Demeanor/Rapport: Unable to Assess Affect (typically observed): Unable to Assess Orientation: : Oriented to Self Alcohol / Substance Use: Not Applicable Psych Involvement: No (comment)  Admission diagnosis:  Fall [W19.XXXA] Fall, initial encounter [id number]] Multiple closed fractures of pelvis without disruption of pelvic ring, initial encounter (HCC) [S32.82XA] Anemia, unspecified type [D64.9] Patient Active Problem List   Diagnosis Date Noted   Fall 02/13/2024   Symptomatic anemia 03/31/2023   Acute urinary retention 03/31/2023   Cellulitis 03/25/2023   Chronic diastolic CHF (congestive heart failure) (HCC)    PAF (paroxysmal atrial fibrillation) (HCC)    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   Witnessed seizure-like activity (HCC) 02/28/2023   ABLA (acute blood loss anemia), symptomatic  02/28/2023   Acute respiratory failure with hypoxia (HCC) 02/28/2023   Laceration of right foot 02/28/2023   Seizure-like activity (HCC) 02/28/2023   Prolonged Q-T interval on ECG 06/22/2022   Near syncope 12/17/2021   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   OA (osteoarthritis) of hip 08/19/2020   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   Bilateral lower extremity edema 05/24/2018   Persistent atrial fibrillation (HCC) 03/16/2018   Thoracic aortic aneurysm (HCC)    (HFpEF) heart failure with preserved ejection fraction (HCC)    CAD (coronary artery disease) 06/26/2013   Ventricular tachycardia (HCC)    Chronic venous insufficiency    Hyperlipidemia LDL goal <70 06/08/2011   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   HTN (hypertension) 05/09/2009   PCP:  Rexanne Ingle, MD Pharmacy:   [facility] [phone] [place], [place] - [address] AT [facility] [address] [place] [address] Phone: [phone] Fax: [phone]     Social Drivers of Health (SDOH) Social History: SDOH Screenings   Food Insecurity: No Food Insecurity (02/13/2024)  Housing: Low Risk  (02/13/2024)  Transportation Needs: No Transportation Needs (02/13/2024)  Utilities: Not At Risk (02/13/2024)  Depression (PHQ2-9): Low Risk  (09/01/2023)  Social Connections: Moderately Integrated (02/13/2024)  Tobacco Use: Low Risk  (01/25/2024)   SDOH Interventions:     Readmission Risk Interventions     No data to display

## 2024-02-14 NOTE — Progress Notes (Signed)
 Subjective: Up in chair.  Some pain with mobilization, but when still pain is minimal.  On 3L [place], doesn't wear O2 at home.  Hungry and wants some breakfast.  Nothing else is bothering him or hurts.  ROS: See above, otherwise other systems negative  Objective: Vital signs in last 24 hours: Temp:  [98.1 F (36.7 C)-99 F (37.2 C)] 98.1 F (36.7 C) (07/28 0449) Pulse Rate:  [78-159] 80 (07/27 2345) Resp:  [12-23] 20 (07/28 0449) BP: (98-155)/(76-108) 98/82 (07/28 0449) SpO2:  [92 %-100 %] 93 % (07/28 0449) Last BM Date : 02/13/24  Intake/Output from previous day: 07/27 0701 - 07/28 0700 In: 296 [Blood:296] Out: 800 [Urine:800] Intake/Output this shift: No intake/output data recorded.  PE: Gen: NAD, sitting up in his chair HEENT: [place] in place, PERRL Heart: regular Lungs: CTAB, [place] in place, on 3L Abd: soft, NT Ext: some scattered ecchymoses Psych: A&Ox3  Lab Results:  Recent Labs    02/13/24 1832 02/14/24 0649  WBC 7.7 11.3*  HGB 8.3* 8.6*  HCT 27.1* 27.4*  PLT 160 145*   BMET Recent Labs    02/13/24 0213 02/14/24 0649  NA 140 140  K 3.7 4.4  CL 108 108  CO2 22 21*  GLUCOSE 112* 103*  BUN 20 19  CREATININE 1.10 1.01  CALCIUM  8.0* 8.0*   PT/INR No results for input(s): LABPROT, INR in the last 72 hours. CMP     Component Value Date/Time   NA 140 02/14/2024 0649   NA 142 10/20/2023 1640   K 4.4 02/14/2024 0649   CL 108 02/14/2024 0649   CO2 21 (L) 02/14/2024 0649   GLUCOSE 103 (H) 02/14/2024 0649   BUN 19 02/14/2024 0649   BUN 18 10/20/2023 1640   CREATININE 1.01 02/14/2024 0649   CREATININE 1.20 11/10/2022 0342   CALCIUM  8.0 (L) 02/14/2024 0649   PROT 5.2 (L) 02/13/2024 0213   PROT 6.2 10/17/2021 1014   ALBUMIN 3.0 (L) 02/13/2024 0213   ALBUMIN 4.0 10/17/2021 1014   AST 39 02/13/2024 0213   ALT 29 02/13/2024 0213   ALKPHOS 46 02/13/2024 0213   BILITOT 0.8 02/13/2024 0213   BILITOT 0.4 10/17/2021 1014   GFRNONAA >60 02/14/2024  0649   GFRAA >60 04/08/2020 0754   Lipase  No results found for: LIPASE     Studies/Results: CT L-SPINE NO CHARGE Addendum Date: 02/13/2024 ADDENDUM REPORT: 02/13/2024 04:56 ADDENDUM: A transcription error resulted in replacement of the appropriate dictation for this examination. The findings and impression are as follows: Extensive multi-vessel coronary artery calcification. Mild global cardiomegaly. Small pericardial effusion. Central pulmonary arteries are enlarged in keeping with changes of pulmonary arterial hypertension. 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 Superimposed moderate atherosclerotic calcification of the thoracic aorta. Visualized thyroid  is unremarkable. No pathologic thoracic adenopathy. Esophagus is unremarkable. No mediastinal hematoma. No pneumomediastinum. Small bilateral pleural effusions are present, right greater than left. Mild bibasilar compressive atelectasis. No confluent pulmonary infiltrate. No pneumothorax. No central obstructing lesion. Thoracic dextroscoliosis noted. No acute bone abnormality within the thorax. Liver, gallbladder, pancreas, spleen, and left adrenal glands are unremarkable. 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 Severe left renal cortical atrophy the right kidney is normal in size and position. Multiple simple and mildly  complex cortical cysts are seen within the left kidney for which no follow-up imaging is recommended. No hydronephrosis. No intrarenal or ureteral calculi. Streak artifact limits evaluation of the bladder. The bladder, however, appears distended and is markedly thick walled suggesting superimposed diffuse infectious or inflammatory cystitis. Mild distal colonic and cecal diverticulosis. Stomach, small  bowel, and large bowel otherwise unremarkable. No free intraperitoneal gas or fluid. Appendix normal. Mild aortoiliac atherosclerotic calcification. No aortic aneurysm. No pathologic adenopathy within the abdomen and pelvis. 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 There are acute fractures of the left superior and inferior pubic rami as well as the right superior pubic ramus. Mild override the left pubic rami fractures is in keeping with a lateral compression type injury. Subacute fracture of the right sacral ala with resorption along the fracture margin. There is infiltration within the space of Retzius surrounding the bladder in keeping with probable extraperitoneal hemorrhage adjacent to the left superior pubic symphyseal fracture. No definite active extravasation identified though imaging is limited by streak artifact. Impression; Acute fractures of the right superior and left superior and inferior pubic rami with mild displacement suggested a lateral compression type injury. Adjacent extraperitoneal hemorrhage within the space of Retzius without definite active extravasation identified. Circumferential bladder wall thickening suggestive of a diffuse infectious or inflammatory cystitis. Additional incidental findings as noted above. Electronically Signed   By: Dorethia Molt M.D.   On: 02/13/2024 04:56   Result Date: 02/13/2024 CLINICAL DATA:  Fall, low back pain EXAM: CT LUMBAR SPINE WITHOUT CONTRAST TECHNIQUE: Multidetector CT imaging of the lumbar spine was performed without intravenous contrast administration. Multiplanar CT image reconstructions were also generated. RADIATION DOSE REDUCTION: This exam was performed according to the departmental dose-optimization program which includes automated exposure control, adjustment of the mA and/or kV according to patient size and/or use of iterative reconstruction technique. COMPARISON:  None Available. 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 Alignment: Moderate lumbar levoscoliosis, apex left at L3. Normal lumbar lordosis. No listhesis. Vertebrae: No acute fracture of the lumbar spine; vertebral body height is preserved. No lytic or blastic bone lesion. Acute to subacute fracture of the right sacral ala noted with mild override of the fracture fragments suggestive of a lateral compression type injury. Osseous structures are diffusely osteopenic. Paraspinal and other soft tissues: See accompanying report for CT examination of the chest, abdomen, and pelvis. No paraspinal fluid collection or inflammatory change identified. Disc levels: Intervertebral disc space narrowing and endplate remodeling at L3-S1 is present in keeping with changes of advanced degenerative disc disease. Axial images demonstrate: L1-2: Unremarkable L2-3: Mild broad-based disc bulge and mild bilateral laminar hypertrophy result in mild central canal stenosis. No significant neuroforaminal narrowing. Mild bilateral facet arthrosis. L3-4: Posterior disc osteophyte complex. Mild bilateral facet arthrosis with associated hypertrophy. Resultant moderate central canal stenosis within the sub foraminal zone. No significant neuroforaminal narrowing. Impingement the right lateral recess with possible impingement of the crossing right L4 nerve root L4-5: Broad disc osteophyte complex, advanced bilateral facet arthrosis, associated facet hypertrophy, and laminar hypertrophy result in severe central canal stenosis within the sub foraminal zone. Mild right and moderate left neuroforaminal narrowing. Impingement of the right lateral recess probable impingement of the crossing right L5 nerve root. Possible impingement the crossing left L5 nerve root. L5-S1: Bilateral facet arthrosis, severe on the left. No significant neuroforaminal narrowing or canal stenosis. IMPRESSION: 1. Acute to subacute fracture of the right sacral ala with mild override of the  fracture fragments suggestive of a lateral compression type injury. 2. No acute fracture or malalignment of the lumbar spine. 3. 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 4. Impingement of the right lateral recesses at L3-4 and L4-5 with possible impingement of the crossing right L4 and L5 nerve roots. 5. Multilevel neuroforaminal narrowing, moderate on the left at L4-5. 6. 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 7. 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 Electronically Signed: By: Dorethia Molt M.D. On: 02/13/2024 04:26   CT T-SPINE NO CHARGE Addendum Date: 02/13/2024 ADDENDUM REPORT: 02/13/2024 04:56 ADDENDUM: A transcription error resulted in replacement of the appropriate dictation for this examination. The findings and impression are as follows: Extensive multi-vessel coronary artery calcification. Mild global cardiomegaly. Small pericardial effusion. Central pulmonary arteries are enlarged in keeping with changes of pulmonary arterial hypertension. 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 Superimposed moderate atherosclerotic calcification of the thoracic aorta. Visualized thyroid  is unremarkable. No pathologic thoracic adenopathy. Esophagus is unremarkable. No mediastinal hematoma. No pneumomediastinum. Small bilateral pleural effusions are present, right greater than left. Mild bibasilar compressive atelectasis. No confluent pulmonary infiltrate. No pneumothorax. No central obstructing lesion. Thoracic dextroscoliosis noted. No acute bone abnormality within the thorax. Liver, gallbladder, pancreas, spleen, and left adrenal glands are unremarkable. Stable 3 cm nodule within the right adrenal gland since prior examination of 08/16/2008 compatible with a benign adrenal  adenoma given its stability over time. Severe left renal cortical atrophy the right kidney is normal in size and position. Multiple simple and mildly complex cortical cysts are seen within the left kidney for which no follow-up imaging is recommended. No hydronephrosis. No intrarenal or ureteral calculi. Streak artifact limits evaluation of the bladder. The bladder, however, appears distended and is markedly thick walled suggesting superimposed diffuse infectious or inflammatory cystitis. Mild distal colonic and cecal diverticulosis. Stomach, small bowel, and large bowel otherwise unremarkable. No free intraperitoneal gas or fluid. Appendix normal. Mild aortoiliac atherosclerotic calcification. No aortic aneurysm. No pathologic adenopathy within the abdomen and pelvis. 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 There are acute fractures of the left superior and inferior pubic rami as well as the right superior pubic ramus. Mild override the left pubic rami fractures is in keeping with a lateral compression type injury. Subacute fracture of the right sacral ala with resorption along the fracture margin. There is infiltration within the space of Retzius surrounding the bladder in keeping with probable extraperitoneal hemorrhage adjacent to the left superior pubic symphyseal fracture. No definite active extravasation identified though imaging is limited by streak artifact. Impression; Acute fractures of the right superior and left superior and inferior pubic rami with mild displacement suggested a lateral compression type injury. Adjacent extraperitoneal hemorrhage within the space of Retzius without definite active extravasation identified. Circumferential bladder wall thickening suggestive of a diffuse infectious or inflammatory cystitis. Additional incidental findings as noted above. Electronically Signed   By: Dorethia Molt M.D.   On: 02/13/2024 04:56   Result Date: 02/13/2024 CLINICAL DATA:  Fall, low  back pain EXAM: CT LUMBAR SPINE WITHOUT CONTRAST TECHNIQUE: Multidetector CT imaging of the lumbar spine was performed without intravenous contrast administration. Multiplanar CT image reconstructions were also generated. RADIATION DOSE REDUCTION: This exam was performed according to the departmental dose-optimization program which includes automated exposure control, adjustment  of the mA and/or kV according to patient size and/or use of iterative reconstruction technique. COMPARISON:  None Available. 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 Alignment: Moderate lumbar levoscoliosis, apex left at L3. Normal lumbar lordosis. No listhesis. Vertebrae: No acute fracture of the lumbar spine; vertebral body height is preserved. No lytic or blastic bone lesion. Acute to subacute fracture of the right sacral ala noted with mild override of the fracture fragments suggestive of a lateral compression type injury. Osseous structures are diffusely osteopenic. Paraspinal and other soft tissues: See accompanying report for CT examination of the chest, abdomen, and pelvis. No paraspinal fluid collection or inflammatory change identified. Disc levels: Intervertebral disc space narrowing and endplate remodeling at L3-S1 is present in keeping with changes of advanced degenerative disc disease. Axial images demonstrate: L1-2: Unremarkable L2-3: Mild broad-based disc bulge and mild bilateral laminar hypertrophy result in mild central canal stenosis. No significant neuroforaminal narrowing. Mild bilateral facet arthrosis. L3-4: Posterior disc osteophyte complex. Mild bilateral facet arthrosis with associated hypertrophy. Resultant moderate central canal stenosis within the sub foraminal zone. No significant neuroforaminal narrowing. Impingement the right lateral recess with possible impingement of the crossing right L4 nerve root L4-5: Broad disc osteophyte complex, advanced bilateral facet  arthrosis, associated facet hypertrophy, and laminar hypertrophy result in severe central canal stenosis within the sub foraminal zone. Mild right and moderate left neuroforaminal narrowing. Impingement of the right lateral recess probable impingement of the crossing right L5 nerve root. Possible impingement the crossing left L5 nerve root. L5-S1: Bilateral facet arthrosis, severe on the left. No significant neuroforaminal narrowing or canal stenosis. IMPRESSION: 1. Acute to subacute fracture of the right sacral ala with mild override of the fracture fragments suggestive of a lateral compression type injury. 2. No acute fracture or malalignment of the lumbar spine. 3. 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 4. Impingement of the right lateral recesses at L3-4 and L4-5 with possible impingement of the crossing right L4 and L5 nerve roots. 5. Multilevel neuroforaminal narrowing, moderate on the left at L4-5. 6. 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 7. 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 Electronically Signed: By: Dorethia Molt M.D. On: 02/13/2024 04:26   CT CHEST ABDOMEN PELVIS W CONTRAST Addendum Date: 02/13/2024 ADDENDUM REPORT: 02/13/2024 04:56 ADDENDUM: A transcription error resulted in replacement of the appropriate dictation for this examination. The findings and impression are as follows: Extensive multi-vessel coronary artery calcification. Mild global cardiomegaly. Small pericardial effusion. Central pulmonary arteries are enlarged in keeping with changes of pulmonary arterial hypertension. 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 Superimposed moderate atherosclerotic calcification of the thoracic aorta. Visualized thyroid  is unremarkable. No pathologic  thoracic adenopathy. Esophagus is unremarkable. No mediastinal hematoma. No pneumomediastinum. Small bilateral pleural effusions are present, right greater than left. Mild bibasilar compressive atelectasis. No confluent pulmonary infiltrate. No pneumothorax. No central obstructing lesion. Thoracic dextroscoliosis noted. No acute bone abnormality within the thorax. Liver, gallbladder, pancreas, spleen, and left adrenal glands are unremarkable. 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 Severe left renal cortical atrophy the right kidney is normal in size and position. Multiple simple and mildly complex cortical cysts are seen within the  left kidney for which no follow-up imaging is recommended. No hydronephrosis. No intrarenal or ureteral calculi. Streak artifact limits evaluation of the bladder. The bladder, however, appears distended and is markedly thick walled suggesting superimposed diffuse infectious or inflammatory cystitis. Mild distal colonic and cecal diverticulosis. Stomach, small bowel, and large bowel otherwise unremarkable. No free intraperitoneal gas or fluid. Appendix normal. Mild aortoiliac atherosclerotic calcification. No aortic aneurysm. No pathologic adenopathy within the abdomen and pelvis. 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 There are acute fractures of the left superior and inferior pubic rami as well as the right superior pubic ramus. Mild override the left pubic rami fractures is in keeping with a lateral compression type injury. Subacute fracture of the right sacral ala with resorption along the fracture margin. There is infiltration within the space of Retzius surrounding the bladder in keeping with probable extraperitoneal hemorrhage adjacent to the left superior pubic symphyseal fracture. No definite active extravasation identified though imaging is  limited by streak artifact. Impression; Acute fractures of the right superior and left superior and inferior pubic rami with mild displacement suggested a lateral compression type injury. Adjacent extraperitoneal hemorrhage within the space of Retzius without definite active extravasation identified. Circumferential bladder wall thickening suggestive of a diffuse infectious or inflammatory cystitis. Additional incidental findings as noted above. Electronically Signed   By: Dorethia Molt M.D.   On: 02/13/2024 04:56   Result Date: 02/13/2024 CLINICAL DATA:  Fall, low back pain EXAM: CT LUMBAR SPINE WITHOUT CONTRAST TECHNIQUE: Multidetector CT imaging of the lumbar spine was performed without intravenous contrast administration. Multiplanar CT image reconstructions were also generated. RADIATION DOSE REDUCTION: This exam was performed according to the departmental dose-optimization program which includes automated exposure control, adjustment of the mA and/or kV according to patient size and/or use of iterative reconstruction technique. COMPARISON:  None Available. 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 Alignment: Moderate lumbar levoscoliosis, apex left at L3. Normal lumbar lordosis. No listhesis. Vertebrae: No acute fracture of the lumbar spine; vertebral body height is preserved. No lytic or blastic bone lesion. Acute to subacute fracture of the right sacral ala noted with mild override of the fracture fragments suggestive of a lateral compression type injury. Osseous structures are diffusely osteopenic. Paraspinal and other soft tissues: See accompanying report for CT examination of the chest, abdomen, and pelvis. No paraspinal fluid collection or inflammatory change identified. Disc levels: Intervertebral disc space narrowing and endplate remodeling at L3-S1 is present in keeping with changes of advanced degenerative disc disease. Axial images demonstrate: L1-2:  Unremarkable L2-3: Mild broad-based disc bulge and mild bilateral laminar hypertrophy result in mild central canal stenosis. No significant neuroforaminal narrowing. Mild bilateral facet arthrosis. L3-4: Posterior disc osteophyte complex. Mild bilateral facet arthrosis with associated hypertrophy. Resultant moderate central canal stenosis within the sub foraminal zone. No significant neuroforaminal narrowing. Impingement the right lateral recess with possible impingement of the crossing right L4 nerve root L4-5: Broad disc osteophyte complex, advanced bilateral facet arthrosis, associated facet hypertrophy, and laminar hypertrophy result in severe central canal stenosis within the sub foraminal zone. Mild right and moderate left neuroforaminal narrowing. Impingement of the right lateral recess probable impingement of the crossing right L5 nerve root. Possible impingement the crossing left L5 nerve root. L5-S1: Bilateral facet arthrosis, severe on the left. No significant neuroforaminal narrowing or canal stenosis. IMPRESSION: 1. Acute to subacute fracture of the right sacral ala with mild override of the fracture fragments suggestive of a lateral compression type  injury. 2. No acute fracture or malalignment of the lumbar spine. 3. 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 4. Impingement of the right lateral recesses at L3-4 and L4-5 with possible impingement of the crossing right L4 and L5 nerve roots. 5. Multilevel neuroforaminal narrowing, moderate on the left at L4-5. 6. 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 7. 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 Electronically Signed: By: Dorethia Molt M.D. On: 02/13/2024 04:26   CT Cervical Spine Wo Contrast Result Date: 02/13/2024 EXAM: CT CERVICAL SPINE WITHOUT CONTRAST 02/13/2024 03:58:59 AM TECHNIQUE: CT of the cervical spine was  performed without the administration of intravenous contrast. Multiplanar reformatted images are provided for review. Automated exposure control, iterative reconstruction, and/or weight based adjustment of the mA/kV was utilized to reduce the radiation dose to as low as reasonably achievable. COMPARISON: None available. CLINICAL HISTORY: Neck trauma (Age >= 65y). No contrast. Lower back pain from a mechanical fall this afternoon. Didn't hit his head. No LOC. Takes eliquis . FINDINGS: CERVICAL SPINE: BONES AND ALIGNMENT: Reversal of the normal cervical lordosis is stable. Grade 1 anterolisthesis at C3-4 is stable. DEGENERATIVE CHANGES: Endplate degenerative changes and uncovertebral spurring results in moderate foraminal stenosis bilaterally at C4-5 and C5-6 and on the right at C6-7. SOFT TISSUES: No prevertebral soft tissue swelling. VASCULATURE: Atherosclerotic changes are present with carotid bifurcations bilaterally without definite stenosis. PLEURAL SPACES: Right pleural effusion and chronic right pleural calcifications are again noted. IMPRESSION: 1. No acute abnormality of the cervical spine related to the reported neck trauma. 2. Stable reversal of the normal cervical lordosis and grade 1 anterolisthesis at C3-4. 3. 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 Electronically signed by: Lonni Necessary MD 02/13/2024 04:19 AM EDT RP Workstation: [id number]   CT Head Wo Contrast Result Date: 02/13/2024 EXAM: CT HEAD WITHOUT CONTRAST 02/13/2024 03:58:59 AM TECHNIQUE: CT of the head was performed without the administration of intravenous contrast. Automated exposure control, iterative reconstruction, and/or weight based adjustment of the mA/kV was utilized to reduce the radiation dose to as low as reasonably achievable. COMPARISON: CT head without contrast 11/18/2023. CLINICAL HISTORY: Head trauma,  moderate-severe. No contrast. Lower back pain from a mechanical fall this afternoon. Didn't hit his head. No LOC. Takes eliquis . FINDINGS: BRAIN AND VENTRICLES: No acute hemorrhage. Gray-white differentiation is preserved. No hydrocephalus. No extra-axial collection. No mass effect or midline shift. Mild atrophy and white matter changes are similar to prior study. ORBITS: No acute abnormality. SINUSES: No acute abnormality. SOFT TISSUES AND SKULL: No acute soft tissue abnormality. No skull fracture. VASCULATURE: Atherosclerotic calcifications are present in the cavernous carotid arteries bilaterally and at the dural margin of both vertebral arteries. No hyperdense vessel is present. IMPRESSION: 1. No acute intracranial abnormality. 2. Mild atrophy and white matter changes, similar to prior study. 3. Atherosclerosis Electronically signed by: Lonni Necessary MD 02/13/2024 04:16 AM EDT RP Workstation: [id number]   DG Chest Portable 1 View Result Date: 02/13/2024 CLINICAL DATA:  Recent fall with chest pain, initial encounter EXAM: PORTABLE CHEST 1 VIEW COMPARISON:  11/18/2023 FINDINGS: Cardiac shadow is enlarged. Aortic calcifications are noted. The lungs are well aerated without focal infiltrate. Chronic scarring in the right base is seen. No pneumothorax is noted. No acute bony abnormality is seen. IMPRESSION: Chronic changes without acute abnormality. Electronically Signed   By:  Oneil Devonshire M.D.   On: 02/13/2024 00:08   DG Elbow Complete Right Result Date: 02/13/2024 CLINICAL DATA:  Recent fall with right elbow pain, initial encounter EXAM: RIGHT ELBOW - COMPLETE 3+ VIEW COMPARISON:  None Available. FINDINGS: There is no evidence of fracture, dislocation, or joint effusion. There is no evidence of arthropathy or other focal bone abnormality. Soft tissues are unremarkable. IMPRESSION: No acute abnormality noted. Electronically Signed   By: Oneil Devonshire M.D.   On: 02/13/2024 00:08   DG Knee Right  Port Result Date: 02/13/2024 CLINICAL DATA:  Recent fall with right knee pain, initial encounter EXAM: PORTABLE RIGHT KNEE - 2 VIEW COMPARISON:  08/31/2023 FINDINGS: Degenerative changes are noted in the medial joint space. Diffuse soft tissue swelling is noted. No acute fracture or dislocation is identified. No joint effusion is seen. IMPRESSION: Degenerative change without acute abnormality. Electronically Signed   By: Oneil Devonshire M.D.   On: 02/13/2024 00:07   DG Knee Left Port Result Date: 02/13/2024 CLINICAL DATA:  Recent fall with knee pain, initial encounter EXAM: PORTABLE LEFT KNEE - 2 VIEW COMPARISON:  None Available. 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 No acute fracture or dislocation is noted. No joint effusion is seen. Diffuse vascular calcifications are noted. IMPRESSION: No acute abnormality noted. Electronically Signed   By: Oneil Devonshire M.D.   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 Lucency is noted over the superior pubic rami bilaterally suspicious for undisplaced fractures. CT would be helpful for further evaluation. No other bony abnormality is seen. IMPRESSION: Findings suspicious for bilateral superior pubic rami fractures. CT would be helpful in this regard. Electronically Signed   By: Oneil Devonshire M.D.   On: 02/13/2024 00:06    Anti-infectives: Anti-infectives (From admission, onward)    None        Assessment/Plan mechanical fall Pelvic fractures with hematoma - per ortho, Dr. Barton.  WBAT.  Mobilize with therapies.  Hbg stable after transfusion yesterday.  Hold Eliquis , duration per ortho, usually at least 1-2 weeks, but consider risks vs benefit of elderly patient falling and anticoagulation Acute on chronic anemia - hgb stable at 8.6 this am after  transfusion on admit.   A fib on Eliquis  - see above, per medicine FEN - regular VTE - on hold due to above ID - none currently needed Dispo - pelvic fxs per ortho, other medical issues per medicine.  No further trauma needs at this time.  Therapies are working on mobilization and pain seems well controlled at this time.  We will sign off, but available if needed  I reviewed Consultant ortho notes, hospitalist notes, last 24 h vitals and pain scores, last 48 h intake and output, last 24 h labs and trends, and last 24 h imaging results.   LOS: 1 day    Burnard FORBES Banter , [facility] 02/14/2024, 9:22 AM Please see Amion for pager number during day hours 7:00am-4:30pm or 7:00am -11:30am on weekends

## 2024-02-14 NOTE — Plan of Care (Signed)
  Problem: Education: Goal: Knowledge of General Education information will improve Description: Including pain rating scale, medication(s)/side effects and non-pharmacologic comfort measures Outcome: Progressing   Problem: Clinical Measurements: Goal: Will remain free from infection Outcome: Progressing Goal: Diagnostic test results will improve Outcome: Progressing Goal: Respiratory complications will improve Outcome: Progressing Goal: Cardiovascular complication will be avoided Outcome: Progressing   Problem: Nutrition: Goal: Adequate nutrition will be maintained Outcome: Progressing   Problem: Coping: Goal: Level of anxiety will decrease Outcome: Progressing

## 2024-02-14 NOTE — Progress Notes (Addendum)
 PROGRESS NOTE                                                                                                                                                                                                             Patient Demographics:    [name], is a 83 y.o. male, DOB - [birth year], FMW:[id number]  Outpatient Primary MD for the patient is Jesse Ingle, MD    LOS - 1  Admit date - 02/12/2024    Chief Complaint  Patient presents with   Fall       Brief Narrative (HPI from H&P)   83 y.o. male with medical history significant of CAD, HFpEF, PAF, and HTN p/w GLF c/b b/l pubic rami fractures and found to have symptomatic anemia.   Pt was in his USOH until yesterday evening around 1700 when he fell. Pt reports bending over and reaching for an item in the lower cabinet, when he lost balance, and fell backwards onto his butt (no LOC or head strike). He was able to get himself off the ground before calling EMS. Pt denied any lightheadedness or dizziness prior to his fall. Of note, pt reports taking OAC for known Afib.   In the ED, pt tachycardic and hypertensive. Labs notable for Hb 6.3, Fe 34(low), TIBC 371 (nl), % sat 9 (low), and ferritin 13 (low). FOBT neg. CTH w/ NAICA. CT chest/abd/pelvis showed acute fractures of the right superior, left superior and inferior pubic rami with mild displacement suggested a lateral compression type injury, and adjacent extraperitoneal hemorrhage. EDP consulted Trauma/Ortho sx who recommended PT/OT evaluation, blood transfusion, and medicine admission.   Subjective:    [name] today has, No headache, No chest pain, No abdominal pain - No Nausea, No new weakness tingling or numbness, no SOB, +ve Back pain   Assessment  & Plan :     Mechanical fall due to loss of balance causing B/l pubic rami fracture c/b adjacent hematoma and acute blood loss anemia -Trauma and Ortho  consulted; it is post 2 units of packed RBC transfusion on 02/13/2024, CBC currently stable, for now continue to hold Eliquis , supportive care, PT OT, Ortho and trauma following.    Symptomatic anemia  - As above   Chronic urinary retention  Pt with I/O cath prn pta, -Foley placed 7/27, add Flomax  and monitor.  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  Gets delirious at home during nighttime, delirium likely will get worse in the hospital due to metabolic encephalopathy, nighttime Seroquel  and monitor.  Family updated in detail.      Condition - Extremely Guarded  Family Communication  : daughter [name] [phone]  on 02/14/24  Code Status :  Full  Consults  :   Trauma, Ortho  PUD Prophylaxis :     Procedures  :            Disposition Plan  :    Status is: Inpatient   DVT Prophylaxis  :  SCDs    Lab Results  Component Value Date   PLT 145 (L) 02/14/2024    Diet :  Diet Order             Diet regular Room service appropriate? Yes; Fluid consistency: Thin  Diet effective now                    Inpatient Medications  Scheduled Meds:  acetaminophen   1,000 mg Oral Q8H   amiodarone   200 mg Oral Daily   atorvastatin   20 mg Oral Daily   carvedilol   6.25 mg Oral BID   Chlorhexidine  Gluconate Cloth  6 each Topical Daily   multivitamin with minerals  1 tablet Oral Daily   Continuous Infusions:  sodium chloride  50 mL/hr at 02/14/24 0611   PRN Meds:.oxyCODONE , polyethylene glycol, prochlorperazine   Antibiotics  :    Anti-infectives (From admission, onward)    None         Objective:   Vitals:   02/13/24 2000 02/13/24 2345 02/14/24 0449 02/14/24 0941  BP: (!) 140/83 130/80 98/82 127/87  Pulse: 99 80  86  Resp: 18 18 20 17   Temp: 98.5 F (36.9 C) 98.4 F (36.9 C) 98.1 F (36.7 C) 98.8 F (37.1 C)   TempSrc: Axillary Axillary Axillary Oral  SpO2: 92% 94% 93% 94%  Weight:      Height:        Wt Readings from Last 3 Encounters:  02/12/24 69 kg  01/25/24 68.9 kg  11/18/23 68 kg     Intake/Output Summary (Last 24 hours) at 02/14/2024 1013 Last data filed at 02/13/2024 2000 Gross per 24 hour  Intake --  Output 800 ml  Net -800 ml     Physical Exam  Awake - but confused, No new F.N deficits, Normal affect [name],PERRAL Supple Neck, No JVD,   Symmetrical Chest wall movement, Good air movement bilaterally, CTAB RRR,No Gallops,Rubs or new Murmurs,  +ve B.Sounds, Abd Soft, No tenderness,   No Cyanosis, Clubbing or edema        Data Review:    Recent Labs  Lab 02/13/24 0213 02/13/24 1832 02/14/24 0649  WBC 10.3 7.7 11.3*  HGB 6.3* 8.3* 8.6*  HCT 21.6* 27.1* 27.4*  PLT 183 160 145*  MCV 82.4 83.6 83.5  MCH 24.0* 25.6* 26.2  MCHC 29.2* 30.6 31.4  RDW 17.7* 17.0* 17.8*  LYMPHSABS 0.5*  --  0.5*  MONOABS 0.6  --  0.7  EOSABS 0.0  --  0.1  BASOSABS 0.0  --  0.0    Recent Labs  Lab 02/13/24 0213 02/14/24 0649  NA 140 140  K 3.7 4.4  CL 108 108  CO2 22  21*  ANIONGAP 10 11  GLUCOSE 112* 103*  BUN 20 19  CREATININE 1.10 1.01  AST 39  --   ALT 29  --   ALKPHOS 46  --   BILITOT 0.8  --   ALBUMIN 3.0*  --   MG  --  2.1  CALCIUM  8.0* 8.0*      Recent Labs  Lab 02/13/24 0213 02/14/24 0649  MG  --  2.1  CALCIUM  8.0* 8.0*    --------------------------------------------------------------------------------------------------------------- Lab Results  Component Value Date   CHOL 115 10/17/2021   HDL 47 10/17/2021   LDLCALC 57 10/17/2021   TRIG 47 10/17/2021   CHOLHDL 2.4 10/17/2021    Lab Results  Component Value Date   HGBA1C 5.8 (H) 03/25/2023   No results for input(s): TSH, T4TOTAL, FREET4, T3FREE, THYROIDAB in the last 72 hours. Recent Labs    02/13/24 0330 02/13/24 0415  VITAMINB12 119*  --   FOLATE 15.4  --   FERRITIN  --   13*  TIBC  --  371  IRON  --  34*  RETICCTPCT 1.8  --       Radiology Report CT L-SPINE NO CHARGE Addendum Date: 02/13/2024 ADDENDUM REPORT: 02/13/2024 04:56 ADDENDUM: A transcription error resulted in replacement of the appropriate dictation for this examination. The findings and impression are as follows: Extensive multi-vessel coronary artery calcification. Mild global cardiomegaly. Small pericardial effusion. Central pulmonary arteries are enlarged in keeping with changes of pulmonary arterial hypertension. 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 Superimposed moderate atherosclerotic calcification of the thoracic aorta. Visualized thyroid  is unremarkable. No pathologic thoracic adenopathy. Esophagus is unremarkable. No mediastinal hematoma. No pneumomediastinum. Small bilateral pleural effusions are present, right greater than left. Mild bibasilar compressive atelectasis. No confluent pulmonary infiltrate. No pneumothorax. No central obstructing lesion. Thoracic dextroscoliosis noted. No acute bone abnormality within the thorax. Liver, gallbladder, pancreas, spleen, and left adrenal glands are unremarkable. 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 Severe left renal cortical atrophy the right kidney is normal in size and position. Multiple simple and mildly complex cortical cysts are seen within the left kidney for which no follow-up imaging is recommended. No hydronephrosis. No intrarenal or ureteral calculi. Streak artifact limits evaluation of the bladder. The bladder, however, appears distended and is markedly thick walled suggesting superimposed diffuse infectious or inflammatory cystitis. Mild distal colonic and cecal diverticulosis. Stomach, small bowel, and large bowel  otherwise unremarkable. No free intraperitoneal gas or fluid. Appendix normal. Mild aortoiliac atherosclerotic calcification. No aortic aneurysm. No pathologic adenopathy within the abdomen and pelvis. 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 There are acute fractures of the left superior and inferior pubic rami as well as the right superior pubic ramus. Mild override the left pubic rami fractures is in keeping with a lateral compression type injury. Subacute fracture of the right sacral ala with resorption along the fracture margin. There is infiltration within the space of Retzius surrounding the bladder in keeping with probable extraperitoneal hemorrhage adjacent to the left superior pubic symphyseal fracture. No definite active extravasation identified though imaging is limited by streak artifact. Impression; Acute fractures of the right superior and left superior and inferior pubic rami with mild displacement suggested a lateral compression type injury. Adjacent extraperitoneal hemorrhage within the space of Retzius  without definite active extravasation identified. Circumferential bladder wall thickening suggestive of a diffuse infectious or inflammatory cystitis. Additional incidental findings as noted above. Electronically Signed   By: Dorethia Molt M.D.   On: 02/13/2024 04:56   Result Date: 02/13/2024 CLINICAL DATA:  Fall, low back pain EXAM: CT LUMBAR SPINE WITHOUT CONTRAST TECHNIQUE: Multidetector CT imaging of the lumbar spine was performed without intravenous contrast administration. Multiplanar CT image reconstructions were also generated. RADIATION DOSE REDUCTION: This exam was performed according to the departmental dose-optimization program which includes automated exposure control, adjustment of the mA and/or kV according to patient size and/or use of iterative reconstruction technique. COMPARISON:  None Available. 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 Alignment: Moderate lumbar levoscoliosis, apex left at L3. Normal lumbar lordosis. No listhesis. Vertebrae: No acute fracture of the lumbar spine; vertebral body height is preserved. No lytic or blastic bone lesion. Acute to subacute fracture of the right sacral ala noted with mild override of the fracture fragments suggestive of a lateral compression type injury. Osseous structures are diffusely osteopenic. Paraspinal and other soft tissues: See accompanying report for CT examination of the chest, abdomen, and pelvis. No paraspinal fluid collection or inflammatory change identified. Disc levels: Intervertebral disc space narrowing and endplate remodeling at L3-S1 is present in keeping with changes of advanced degenerative disc disease. Axial images demonstrate: L1-2: Unremarkable L2-3: Mild broad-based disc bulge and mild bilateral laminar hypertrophy result in mild central canal stenosis. No significant neuroforaminal narrowing. Mild bilateral facet arthrosis. L3-4: Posterior disc osteophyte complex. Mild bilateral facet arthrosis with associated hypertrophy. Resultant moderate central canal stenosis within the sub foraminal zone. No significant neuroforaminal narrowing. Impingement the right lateral recess with possible impingement of the crossing right L4 nerve root L4-5: Broad disc osteophyte complex, advanced bilateral facet arthrosis, associated facet hypertrophy, and laminar hypertrophy result in severe central canal stenosis within the sub foraminal zone. Mild right and moderate left neuroforaminal narrowing. Impingement of the right lateral recess probable impingement of the crossing right L5 nerve root. Possible impingement the crossing left L5 nerve root. L5-S1: Bilateral facet arthrosis, severe on the left. No significant neuroforaminal narrowing or canal stenosis. IMPRESSION: 1. Acute to subacute fracture of the right sacral ala with mild override of the fracture fragments  suggestive of a lateral compression type injury. 2. No acute fracture or malalignment of the lumbar spine. 3. 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 4. Impingement of the right lateral recesses at L3-4 and L4-5 with possible impingement of the crossing right L4 and L5 nerve roots. 5. Multilevel neuroforaminal narrowing, moderate on the left at L4-5. 6. 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 7. 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 Electronically Signed: By: Dorethia Molt M.D. On: 02/13/2024 04:26   CT T-SPINE NO CHARGE Addendum Date: 02/13/2024 ADDENDUM REPORT: 02/13/2024 04:56 ADDENDUM: A transcription error resulted in replacement of the appropriate dictation for this examination. The findings and impression are as follows: Extensive multi-vessel coronary artery calcification. Mild global cardiomegaly. Small pericardial effusion. Central pulmonary arteries are enlarged in keeping with changes of pulmonary arterial hypertension. 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 Superimposed moderate atherosclerotic calcification of the thoracic aorta. Visualized thyroid  is unremarkable. No pathologic thoracic adenopathy. Esophagus is unremarkable. No mediastinal hematoma. No pneumomediastinum. Small  bilateral pleural effusions are present, right greater than left. Mild bibasilar compressive atelectasis. No confluent pulmonary infiltrate. No pneumothorax. No central obstructing lesion. Thoracic dextroscoliosis noted. No acute bone abnormality within the thorax. Liver, gallbladder, pancreas, spleen, and left adrenal glands are unremarkable. Stable 3 cm nodule within the right adrenal gland since prior examination of 08/16/2008 compatible with a benign adrenal adenoma given its  stability over time. Severe left renal cortical atrophy the right kidney is normal in size and position. Multiple simple and mildly complex cortical cysts are seen within the left kidney for which no follow-up imaging is recommended. No hydronephrosis. No intrarenal or ureteral calculi. Streak artifact limits evaluation of the bladder. The bladder, however, appears distended and is markedly thick walled suggesting superimposed diffuse infectious or inflammatory cystitis. Mild distal colonic and cecal diverticulosis. Stomach, small bowel, and large bowel otherwise unremarkable. No free intraperitoneal gas or fluid. Appendix normal. Mild aortoiliac atherosclerotic calcification. No aortic aneurysm. No pathologic adenopathy within the abdomen and pelvis. 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 There are acute fractures of the left superior and inferior pubic rami as well as the right superior pubic ramus. Mild override the left pubic rami fractures is in keeping with a lateral compression type injury. Subacute fracture of the right sacral ala with resorption along the fracture margin. There is infiltration within the space of Retzius surrounding the bladder in keeping with probable extraperitoneal hemorrhage adjacent to the left superior pubic symphyseal fracture. No definite active extravasation identified though imaging is limited by streak artifact. Impression; Acute fractures of the right superior and left superior and inferior pubic rami with mild displacement suggested a lateral compression type injury. Adjacent extraperitoneal hemorrhage within the space of Retzius without definite active extravasation identified. Circumferential bladder wall thickening suggestive of a diffuse infectious or inflammatory cystitis. Additional incidental findings as noted above. Electronically Signed   By: Dorethia Molt M.D.   On: 02/13/2024 04:56   Result Date: 02/13/2024 CLINICAL DATA:  Fall, low back pain EXAM:  CT LUMBAR SPINE WITHOUT CONTRAST TECHNIQUE: Multidetector CT imaging of the lumbar spine was performed without intravenous contrast administration. Multiplanar CT image reconstructions were also generated. RADIATION DOSE REDUCTION: This exam was performed according to the departmental dose-optimization program which includes automated exposure control, adjustment of the mA and/or kV according to patient size and/or use of iterative reconstruction technique. COMPARISON:  None Available. 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 Alignment: Moderate lumbar levoscoliosis, apex left at L3. Normal lumbar lordosis. No listhesis. Vertebrae: No acute fracture of the lumbar spine; vertebral body height is preserved. No lytic or blastic bone lesion. Acute to subacute fracture of the right sacral ala noted with mild override of the fracture fragments suggestive of a lateral compression type injury. Osseous structures are diffusely osteopenic. Paraspinal and other soft tissues: See accompanying report for CT examination of the chest, abdomen, and pelvis. No paraspinal fluid collection or inflammatory change identified. Disc levels: Intervertebral disc space narrowing and endplate remodeling at L3-S1 is present in keeping with changes of advanced degenerative disc disease. Axial images demonstrate: L1-2: Unremarkable L2-3: Mild broad-based disc bulge and mild bilateral laminar hypertrophy result in mild central canal stenosis. No significant neuroforaminal narrowing. Mild bilateral facet arthrosis. L3-4: Posterior disc osteophyte complex. Mild bilateral facet arthrosis with associated hypertrophy. Resultant moderate central canal stenosis within the sub foraminal zone. No significant neuroforaminal narrowing. Impingement the right lateral recess with possible impingement of the crossing right L4 nerve root L4-5: Broad  disc osteophyte complex, advanced bilateral facet arthrosis,  associated facet hypertrophy, and laminar hypertrophy result in severe central canal stenosis within the sub foraminal zone. Mild right and moderate left neuroforaminal narrowing. Impingement of the right lateral recess probable impingement of the crossing right L5 nerve root. Possible impingement the crossing left L5 nerve root. L5-S1: Bilateral facet arthrosis, severe on the left. No significant neuroforaminal narrowing or canal stenosis. IMPRESSION: 1. Acute to subacute fracture of the right sacral ala with mild override of the fracture fragments suggestive of a lateral compression type injury. 2. No acute fracture or malalignment of the lumbar spine. 3. 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 4. Impingement of the right lateral recesses at L3-4 and L4-5 with possible impingement of the crossing right L4 and L5 nerve roots. 5. Multilevel neuroforaminal narrowing, moderate on the left at L4-5. 6. 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 7. 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 Electronically Signed: By: Dorethia Molt M.D. On: 02/13/2024 04:26   CT CHEST ABDOMEN PELVIS W CONTRAST Addendum Date: 02/13/2024 ADDENDUM REPORT: 02/13/2024 04:56 ADDENDUM: A transcription error resulted in replacement of the appropriate dictation for this examination. The findings and impression are as follows: Extensive multi-vessel coronary artery calcification. Mild global cardiomegaly. Small pericardial effusion. Central pulmonary arteries are enlarged in keeping with changes of pulmonary arterial hypertension. 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 Superimposed moderate atherosclerotic calcification of the thoracic aorta. Visualized thyroid  is unremarkable. No pathologic thoracic  adenopathy. Esophagus is unremarkable. No mediastinal hematoma. No pneumomediastinum. Small bilateral pleural effusions are present, right greater than left. Mild bibasilar compressive atelectasis. No confluent pulmonary infiltrate. No pneumothorax. No central obstructing lesion. Thoracic dextroscoliosis noted. No acute bone abnormality within the thorax. Liver, gallbladder, pancreas, spleen, and left adrenal glands are unremarkable. 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 Severe left renal cortical atrophy the right kidney is normal in size and position. Multiple simple and mildly complex cortical cysts are seen within the left kidney for which no follow-up imaging is recommended. No hydronephrosis. No intrarenal or ureteral calculi. Streak artifact limits evaluation of the bladder. The bladder, however, appears distended and is markedly thick walled suggesting superimposed diffuse infectious or inflammatory cystitis. Mild distal colonic and cecal diverticulosis. Stomach, small bowel, and large bowel otherwise unremarkable. No free intraperitoneal gas or fluid. Appendix normal. Mild aortoiliac atherosclerotic calcification. No aortic aneurysm. No pathologic adenopathy within the abdomen and pelvis. 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 There are acute fractures of the left superior and inferior pubic rami as well as the right superior pubic ramus. Mild override the left pubic rami fractures is in keeping with a lateral compression type injury. Subacute fracture of the right sacral ala with resorption along the fracture margin. There is infiltration within the space of Retzius surrounding the bladder in keeping with probable extraperitoneal hemorrhage adjacent to the left superior pubic symphyseal fracture. No definite active extravasation identified though imaging is limited by  streak artifact. Impression; Acute fractures of the right superior and left superior and inferior pubic rami with mild displacement suggested a lateral compression type injury. Adjacent extraperitoneal hemorrhage within the space of Retzius without definite active extravasation identified. Circumferential bladder  wall thickening suggestive of a diffuse infectious or inflammatory cystitis. Additional incidental findings as noted above. Electronically Signed   By: Dorethia Molt M.D.   On: 02/13/2024 04:56   Result Date: 02/13/2024 CLINICAL DATA:  Fall, low back pain EXAM: CT LUMBAR SPINE WITHOUT CONTRAST TECHNIQUE: Multidetector CT imaging of the lumbar spine was performed without intravenous contrast administration. Multiplanar CT image reconstructions were also generated. RADIATION DOSE REDUCTION: This exam was performed according to the departmental dose-optimization program which includes automated exposure control, adjustment of the mA and/or kV according to patient size and/or use of iterative reconstruction technique. COMPARISON:  None Available. 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 Alignment: Moderate lumbar levoscoliosis, apex left at L3. Normal lumbar lordosis. No listhesis. Vertebrae: No acute fracture of the lumbar spine; vertebral body height is preserved. No lytic or blastic bone lesion. Acute to subacute fracture of the right sacral ala noted with mild override of the fracture fragments suggestive of a lateral compression type injury. Osseous structures are diffusely osteopenic. Paraspinal and other soft tissues: See accompanying report for CT examination of the chest, abdomen, and pelvis. No paraspinal fluid collection or inflammatory change identified. Disc levels: Intervertebral disc space narrowing and endplate remodeling at L3-S1 is present in keeping with changes of advanced degenerative disc disease. Axial images demonstrate: L1-2: Unremarkable  L2-3: Mild broad-based disc bulge and mild bilateral laminar hypertrophy result in mild central canal stenosis. No significant neuroforaminal narrowing. Mild bilateral facet arthrosis. L3-4: Posterior disc osteophyte complex. Mild bilateral facet arthrosis with associated hypertrophy. Resultant moderate central canal stenosis within the sub foraminal zone. No significant neuroforaminal narrowing. Impingement the right lateral recess with possible impingement of the crossing right L4 nerve root L4-5: Broad disc osteophyte complex, advanced bilateral facet arthrosis, associated facet hypertrophy, and laminar hypertrophy result in severe central canal stenosis within the sub foraminal zone. Mild right and moderate left neuroforaminal narrowing. Impingement of the right lateral recess probable impingement of the crossing right L5 nerve root. Possible impingement the crossing left L5 nerve root. L5-S1: Bilateral facet arthrosis, severe on the left. No significant neuroforaminal narrowing or canal stenosis. IMPRESSION: 1. Acute to subacute fracture of the right sacral ala with mild override of the fracture fragments suggestive of a lateral compression type injury. 2. No acute fracture or malalignment of the lumbar spine. 3. 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 4. Impingement of the right lateral recesses at L3-4 and L4-5 with possible impingement of the crossing right L4 and L5 nerve roots. 5. Multilevel neuroforaminal narrowing, moderate on the left at L4-5. 6. 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 7. 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 Electronically Signed: By: Dorethia Molt M.D. On: 02/13/2024 04:26   CT Cervical Spine Wo Contrast Result Date: 02/13/2024 EXAM: CT CERVICAL SPINE WITHOUT CONTRAST 02/13/2024 03:58:59 AM TECHNIQUE: CT of the cervical spine was performed without  the administration of intravenous contrast. Multiplanar reformatted images are provided for review. Automated exposure control, iterative reconstruction, and/or weight based adjustment of the mA/kV was utilized to reduce the radiation dose to as low as reasonably achievable. COMPARISON: None available. CLINICAL HISTORY: Neck trauma (Age >= 65y). No contrast. Lower back pain from a mechanical fall this afternoon. Didn't hit his head. No LOC. Takes eliquis . FINDINGS: CERVICAL SPINE: BONES AND ALIGNMENT: Reversal of the normal cervical lordosis is stable. Grade 1 anterolisthesis at C3-4 is stable.  DEGENERATIVE CHANGES: Endplate degenerative changes and uncovertebral spurring results in moderate foraminal stenosis bilaterally at C4-5 and C5-6 and on the right at C6-7. SOFT TISSUES: No prevertebral soft tissue swelling. VASCULATURE: Atherosclerotic changes are present with carotid bifurcations bilaterally without definite stenosis. PLEURAL SPACES: Right pleural effusion and chronic right pleural calcifications are again noted. IMPRESSION: 1. No acute abnormality of the cervical spine related to the reported neck trauma. 2. Stable reversal of the normal cervical lordosis and grade 1 anterolisthesis at C3-4. 3. 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 Electronically signed by: Lonni Necessary MD 02/13/2024 04:19 AM EDT RP Workstation: [id number]   CT Head Wo Contrast Result Date: 02/13/2024 EXAM: CT HEAD WITHOUT CONTRAST 02/13/2024 03:58:59 AM TECHNIQUE: CT of the head was performed without the administration of intravenous contrast. Automated exposure control, iterative reconstruction, and/or weight based adjustment of the mA/kV was utilized to reduce the radiation dose to as low as reasonably achievable. COMPARISON: CT head without contrast 11/18/2023. CLINICAL HISTORY: Head trauma, moderate-severe. No  contrast. Lower back pain from a mechanical fall this afternoon. Didn't hit his head. No LOC. Takes eliquis . FINDINGS: BRAIN AND VENTRICLES: No acute hemorrhage. Gray-white differentiation is preserved. No hydrocephalus. No extra-axial collection. No mass effect or midline shift. Mild atrophy and white matter changes are similar to prior study. ORBITS: No acute abnormality. SINUSES: No acute abnormality. SOFT TISSUES AND SKULL: No acute soft tissue abnormality. No skull fracture. VASCULATURE: Atherosclerotic calcifications are present in the cavernous carotid arteries bilaterally and at the dural margin of both vertebral arteries. No hyperdense vessel is present. IMPRESSION: 1. No acute intracranial abnormality. 2. Mild atrophy and white matter changes, similar to prior study. 3. Atherosclerosis Electronically signed by: Lonni Necessary MD 02/13/2024 04:16 AM EDT RP Workstation: [id number]   DG Chest Portable 1 View Result Date: 02/13/2024 CLINICAL DATA:  Recent fall with chest pain, initial encounter EXAM: PORTABLE CHEST 1 VIEW COMPARISON:  11/18/2023 FINDINGS: Cardiac shadow is enlarged. Aortic calcifications are noted. The lungs are well aerated without focal infiltrate. Chronic scarring in the right base is seen. No pneumothorax is noted. No acute bony abnormality is seen. IMPRESSION: Chronic changes without acute abnormality. Electronically Signed   By: Oneil Devonshire M.D.   On: 02/13/2024 00:08   DG Elbow Complete Right Result Date: 02/13/2024 CLINICAL DATA:  Recent fall with right elbow pain, initial encounter EXAM: RIGHT ELBOW - COMPLETE 3+ VIEW COMPARISON:  None Available. FINDINGS: There is no evidence of fracture, dislocation, or joint effusion. There is no evidence of arthropathy or other focal bone abnormality. Soft tissues are unremarkable. IMPRESSION: No acute abnormality noted. Electronically Signed   By: Oneil Devonshire M.D.   On: 02/13/2024 00:08   DG Knee Right Port Result Date:  02/13/2024 CLINICAL DATA:  Recent fall with right knee pain, initial encounter EXAM: PORTABLE RIGHT KNEE - 2 VIEW COMPARISON:  08/31/2023 FINDINGS: Degenerative changes are noted in the medial joint space. Diffuse soft tissue swelling is noted. No acute fracture or dislocation is identified. No joint effusion is seen. IMPRESSION: Degenerative change without acute abnormality. Electronically Signed   By: Oneil Devonshire M.D.   On: 02/13/2024 00:07   DG Knee Left Port Result Date: 02/13/2024 CLINICAL DATA:  Recent fall with knee pain, initial encounter EXAM: PORTABLE LEFT KNEE - 2 VIEW COMPARISON:  None Available. 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 No acute fracture or dislocation is noted. No joint  effusion is seen. Diffuse vascular calcifications are noted. IMPRESSION: No acute abnormality noted. Electronically Signed   By: Oneil Devonshire M.D.   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 Lucency is noted over the superior pubic rami bilaterally suspicious for undisplaced fractures. CT would be helpful for further evaluation. No other bony abnormality is seen. IMPRESSION: Findings suspicious for bilateral superior pubic rami fractures. CT would be helpful in this regard. Electronically Signed   By: Oneil Devonshire M.D.   On: 02/13/2024 00:06     Signature  -   Lavada Stank M.D on 02/14/2024 at 10:13 AM   -  To page go to [email or web address]

## 2024-02-14 NOTE — Progress Notes (Addendum)
   02/14/24 1053  Mobility  Activity Transferred from chair to bed  Level of Assistance Moderate assist, patient does 50-74% (+2)  Assistive Device Front wheel walker  RLE Weight Bearing Per Provider Order WBAT  LLE Weight Bearing Per Provider Order WBAT  Activity Response Tolerated fair  Mobility Referral Yes  Mobility visit 1 Mobility  Mobility Specialist Start Time (ACUTE ONLY) 1053  Mobility Specialist Stop Time (ACUTE ONLY) 1103  Mobility Specialist Time Calculation (min) (ACUTE ONLY) 10 min   Mobility Specialist: Progress Note- Visits:2  Pt agreeable to mobility session - received in chair. C/o pubic pain rated 3/10. Pt states pain increases when mobilizing. Returned to bed with all needs met - call bell within reach. Transport and NT present.   ___________________________________  Post Mobility: HR 104, SPO2 91% 2L Pt agreeable to mobility session - Consulting civil engineer requesting assistance with pt - received on BSC. C/o pubic pain and back pain when coughing.  Pericare completed with assistance. Returned to bed with all needs met - call bell within reach. Daughter present.    Virgle Boards, BS Mobility Specialist Please contact via SecureChat or  Rehab office at [phone].

## 2024-02-15 ENCOUNTER — Telehealth: Payer: Self-pay | Admitting: *Deleted

## 2024-02-15 DIAGNOSIS — I480 Paroxysmal atrial fibrillation: Secondary | ICD-10-CM | POA: Diagnosis not present

## 2024-02-15 DIAGNOSIS — W19XXXA Unspecified fall, initial encounter: Secondary | ICD-10-CM | POA: Diagnosis not present

## 2024-02-15 DIAGNOSIS — I2510 Atherosclerotic heart disease of native coronary artery without angina pectoris: Secondary | ICD-10-CM

## 2024-02-15 DIAGNOSIS — D649 Anemia, unspecified: Secondary | ICD-10-CM | POA: Diagnosis not present

## 2024-02-15 LAB — BASIC METABOLIC PANEL WITH GFR
Anion gap: 6 (ref 5–15)
BUN: 18 mg/dL (ref 8–23)
CO2: 25 mmol/L (ref 22–32)
Calcium: 8.1 mg/dL — ABNORMAL LOW (ref 8.9–10.3)
Chloride: 112 mmol/L — ABNORMAL HIGH (ref 98–111)
Creatinine, Ser: 0.98 mg/dL (ref 0.61–1.24)
GFR, Estimated: >60 mL/min (ref >60)
Glucose, Bld: 101 mg/dL — ABNORMAL HIGH (ref 70–99)
Potassium: 3.8 mmol/L (ref 3.5–5.1)
Sodium: 143 mmol/L (ref 135–145)

## 2024-02-15 LAB — CBC WITH DIFFERENTIAL/PLATELET
Abs Immature Granulocytes: 0.04 K/uL (ref 0.00–0.07)
Basophils Absolute: 0 K/uL (ref 0.0–0.1)
Basophils Relative: 0 %
Eosinophils Absolute: 0.2 K/uL (ref 0.0–0.5)
Eosinophils Relative: 2 %
HCT: 25.7 % — ABNORMAL LOW (ref 39.0–52.0)
Hemoglobin: 7.9 g/dL — ABNORMAL LOW (ref 13.0–17.0)
Immature Granulocytes: 1 %
Lymphocytes Relative: 8 %
Lymphs Abs: 0.7 K/uL (ref 0.7–4.0)
MCH: 25.7 pg — ABNORMAL LOW (ref 26.0–34.0)
MCHC: 30.7 g/dL (ref 30.0–36.0)
MCV: 83.7 fL (ref 80.0–100.0)
Monocytes Absolute: 0.5 K/uL (ref 0.1–1.0)
Monocytes Relative: 6 %
Neutro Abs: 7.4 K/uL (ref 1.7–7.7)
Neutrophils Relative %: 83 %
Platelets: 136 K/uL — ABNORMAL LOW (ref 150–400)
RBC: 3.07 MIL/uL — ABNORMAL LOW (ref 4.22–5.81)
RDW: 18.4 % — ABNORMAL HIGH (ref 11.5–15.5)
WBC: 8.9 K/uL (ref 4.0–10.5)
nRBC: 0.3 % — ABNORMAL HIGH (ref 0.0–0.2)

## 2024-02-15 LAB — T4, FREE: Free T4: 1.08 ng/dL (ref 0.61–1.12)

## 2024-02-15 LAB — TSH: TSH: 0.849 u[IU]/mL (ref 0.350–4.500)

## 2024-02-15 LAB — MAGNESIUM: Magnesium: 2.2 mg/dL (ref 1.7–2.4)

## 2024-02-15 MED ORDER — OXYCODONE HCL 5 MG PO TABS
5.0000 mg | ORAL_TABLET | Freq: Four times a day (QID) | ORAL | Status: DC | PRN
  Administered 2024-02-15 – 2024-02-20 (×6): 5 mg via ORAL
  Filled 2024-02-15 (×6): qty 1

## 2024-02-15 MED ORDER — DIGOXIN 0.25 MG/ML IJ SOLN
0.2500 mg | Freq: Four times a day (QID) | INTRAMUSCULAR | Status: AC
  Administered 2024-02-15 (×2): 0.25 mg via INTRAVENOUS
  Filled 2024-02-15 (×2): qty 1

## 2024-02-15 MED ORDER — DILTIAZEM HCL 25 MG/5ML IV SOLN
10.0000 mg | Freq: Four times a day (QID) | INTRAVENOUS | Status: DC | PRN
  Administered 2024-02-15: 10 mg via INTRAVENOUS
  Filled 2024-02-15: qty 5

## 2024-02-15 MED ORDER — DILTIAZEM HCL 30 MG PO TABS
30.0000 mg | ORAL_TABLET | Freq: Four times a day (QID) | ORAL | Status: DC
  Administered 2024-02-15 – 2024-02-17 (×7): 30 mg via ORAL
  Filled 2024-02-15 (×7): qty 1

## 2024-02-15 MED ORDER — OXYCODONE HCL 5 MG PO TABS: 2.5000 mg | ORAL_TABLET | ORAL | Status: DC | PRN

## 2024-02-15 MED ORDER — DOCUSATE SODIUM 100 MG PO CAPS
100.0000 mg | ORAL_CAPSULE | Freq: Every day | ORAL | Status: DC | PRN
  Administered 2024-02-15 – 2024-02-18 (×4): 100 mg via ORAL
  Filled 2024-02-15 (×4): qty 1

## 2024-02-15 MED ORDER — OXYCODONE HCL 5 MG PO TABS: 2.5000 mg | ORAL_TABLET | Freq: Four times a day (QID) | ORAL | Status: DC | PRN

## 2024-02-15 MED ORDER — TRAMADOL HCL 50 MG PO TABS
50.0000 mg | ORAL_TABLET | Freq: Two times a day (BID) | ORAL | Status: DC | PRN
  Administered 2024-02-15 – 2024-02-18 (×4): 50 mg via ORAL
  Filled 2024-02-15 (×4): qty 1

## 2024-02-15 MED ORDER — LACTATED RINGERS IV SOLN: INTRAVENOUS | Status: AC

## 2024-02-15 NOTE — Telephone Encounter (Signed)
 Returning [name]'s telephone voice message regarding [name].  [name] states that [name] fell on 02-12-2024 and fractured his pelvis and is currently hospitalized.  [name] states that [name] will be in rehab following his hospitalization.  Venous procedure (endovenous laser ablation of right greater saphenous vein) scheduled for 03-23-2024 will be cancelled as well as post LA duplex and VV FU post laser ablation with Dr. Sheree scheduled for 04-06-2024.  Jesse will call back when [name] has healed and is fully mobile.  Will plan on follow up appointment with Dr. Sheree and venous reflux (right leg) before rescheduling laser ablation (right leg).

## 2024-02-15 NOTE — TOC Progression Note (Signed)
 Transition of Care [facility]) - Progression Note    Patient Details  Name: [name] MRN: [id number] Date of Birth: 01-Feb-1941  [facility] CM/SW Contact  Inocente GORMAN Kindle, LCSW Phone Number: 02/15/2024, 11:41 AM  Clinical Narrative:    11:41 AM-CSW contacted [place] but per Delon Moats is in a meeting and will call CSW back.   CSW updated patient's daughter, Nena. [facility] unable to accept patients outside their community.    Expected Discharge Plan: Skilled Nursing Facility Barriers to Discharge: Continued Medical Work up, English as a second language teacher, SNF Pending bed offer               Expected Discharge Plan and Services In-house Referral: Clinical Social Work   Post Acute Care Choice: Skilled Nursing Facility Living arrangements for the past 2 months: Single Family Home                                       Social Drivers of Health (SDOH) Interventions SDOH Screenings   Food Insecurity: No Food Insecurity (02/13/2024)  Housing: Low Risk  (02/13/2024)  Transportation Needs: No Transportation Needs (02/13/2024)  Utilities: Not At Risk (02/13/2024)  Depression (PHQ2-9): Low Risk  (09/01/2023)  Social Connections: Moderately Integrated (02/13/2024)  Tobacco Use: Low Risk  (01/25/2024)    Readmission Risk Interventions     No data to display

## 2024-02-15 NOTE — Progress Notes (Signed)
 PT Cancellation Note  Patient Details Name: [name] MRN: [id number] DOB: May 25, 1941   Cancelled Treatment:    Reason Eval/Treat Not Completed: Patient declined, no reason specified Despite encouragement patient declined getting up out of bed today. Will re-attempt later as time allows.   Brittannie Tawney 02/15/2024, 10:00 AM

## 2024-02-15 NOTE — Consult Note (Signed)
 CARDIOLOGY CONSULT NOTE       Patient ID: [name] MRN: [id number] DOB/AGE: 1940-12-05 83 y.o.  Admit date: 02/12/2024 Referring Physician: Dennise Primary Physician: Rexanne Ingle, MD Primary Cardiologist: Maxine Reason for Consultation: Afib  Principal Problem:   Fall   HPI:  83 y.o. with history of PAF. Mostly afib past year. On eliquis . Turned down for Watchman due to age and LE venous ulcers. Admitted with mechanical fall with multiple rami fractures complicated by extraperitoneal hemorrhage adjacent to the left superior pubic fracture. DOAC has been stopped. Admitting rhythm afib. Daughter who is a retired Engineer, civil (consulting) concerned about high HR;s. His resting HR is around 100-105 and he is asymptomatic. Distant history of cAD with stent RCA in 2007 no angina and low risk myovue 07/22/20 EF has been normal with mild AS/MR on echo 06/21/23. Pain control is good. He is chronically anemic Hct today 25.7. His LE venous reflux is well Rx with healed ulcers no only trace edema at this time He is on amiodarone  200 mg, iv digoxin  started this morning with 10 mg dose if iv cardizem  He has had SSS and beta blockers stopped in past with monitors showing average HR only 58 when not in afib. Dr Cindie restarted coreg  6.25 bid in May 2025  He is widowed and still lives independently. 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 One daughter with him today is retired Engineer, civil (consulting) who lives in [place] and another daughter lives in [place] is a Administrator, Civil Service. Patient would like to return home but will likely need a 2 week SNF stay  ROS All other systems reviewed and negative except as noted above  Past Medical History:  Diagnosis Date   Ascending aorta dilatation (HCC)    44mm by chest CT angio 04/2018   CAD (coronary artery disease)    a. 05/2006 Abnl stress test w/ 2-69mm ST dep; b. 05/2006 Cath/PCI: LM nl, LAD 30-40ost, 20-30p, 90d (small), D1 nl, D2 nl, LCX nl, OM1/2 nl, RCA Ca2+, 79m/d (3.5x16  Liberty BMS & 3.5x12 Liberty BMS), EF 50%.   Chronic diastolic CHF (congestive heart failure) (HCC)    a. 10/2017 Echo: EF 50-55%, mild AI, sev dil LA, mod dil RA.   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 CHA2DS2VASc 5-->Eliquis ; b. Prev WCT on flecainide;  c. 12/2017 s/p DCCV.   Pneumothorax    Prolonged Q-T interval on ECG 06/22/2022   Ventricular tachycardia (HCC)    a. possibly proarrhythmia from flecainide.    Family History  Problem Relation Age of Onset   Coronary artery disease Other     Social History   Socioeconomic History   Marital status: Single    Spouse name: Not on file   Number of children: Not on file   Years of education: Not on file   Highest education level: Not on file  Occupational History   Occupation: retired  Tobacco Use   Smoking status: Never   Smokeless tobacco: Never  Vaping Use   Vaping status: Never Used  Substance and Sexual Activity   Alcohol use: Not Currently    Comment: occsaoinal   Drug use: Not Currently    Types: Other-see comments   Sexual activity: Not Currently  Other Topics Concern   Not on file  Social History Narrative   ** Merged History Encounter **       Lives  in [place] by himself but 2 dtrs nearby and help out when necessary.   Social Drivers of Corporate investment banker Strain: Not on file  Food Insecurity: No Food Insecurity (02/13/2024)   Hunger Vital Sign    Worried About Running Out of Food in the Last Year: Never true    Ran Out of Food in the Last Year: Never true  Transportation Needs: No Transportation Needs (02/13/2024)   PRAPARE - Administrator, Civil Service (Medical): No    Lack of Transportation (Non-Medical): No  Physical Activity: Not on file  Stress: Not on file  Social Connections: Moderately Integrated (02/13/2024)   Social Connection and Isolation Panel    Frequency of  Communication with Friends and Family: More than three times a week    Frequency of Social Gatherings with Friends and Family: More than three times a week    Attends Religious Services: More than 4 times per year    Active Member of [employer] or Organizations: Yes    Attends Banker Meetings: More than 4 times per year    Marital Status: Widowed  Intimate Partner Violence: Not At Risk (02/13/2024)   Humiliation, Afraid, Rape, and Kick questionnaire    Fear of Current or Ex-Partner: No    Emotionally Abused: No    Physically Abused: No    Sexually Abused: No    Past Surgical History:  Procedure Laterality Date   CARDIOVERSION N/A 12/23/2017   Procedure: CARDIOVERSION;  Surgeon: Shlomo Wilbert SAUNDERS, MD;  Location: [facility] ENDOSCOPY;  Service: Cardiovascular;  Laterality: N/A;   CARDIOVERSION N/A 03/08/2018   Procedure: CARDIOVERSION;  Surgeon: Shlomo Wilbert SAUNDERS, MD;  Location: [facility] ENDOSCOPY;  Service: Cardiovascular;  Laterality: N/A;   INGUINAL HERNIA REPAIR     x2   left total hip surgery     TEE WITHOUT CARDIOVERSION N/A 03/08/2018   Procedure: TRANSESOPHAGEAL ECHOCARDIOGRAM (TEE);  Surgeon: Shlomo Wilbert SAUNDERS, MD;  Location: [facility] ENDOSCOPY;  Service: Cardiovascular;  Laterality: N/A;   TOTAL HIP ARTHROPLASTY Right 08/19/2020   Procedure: TOTAL HIP ARTHROPLASTY ANTERIOR APPROACH;  Surgeon: Melodi Lerner, MD;  Location: [facility] ORS;  Service: Orthopedics;  Laterality: Right;    TOTAL KNEE ARTHROPLASTY Left 02/28/2018   Procedure: LEFT TOTAL KNEE ARTHROPLASTY;  Surgeon: Melodi Lerner, MD;  Location: [facility] ORS;  Service: Orthopedics;  Laterality: Left;   VEIN LIGATION AND STRIPPING Left 04/08/2020   Procedure: LEFT LOWER EXTREMITY VEIN LIGATION AND EXCISION OF ULCER;  Surgeon: Eliza Lonni RAMAN, MD;  Location: [facility] OR;  Service: Vascular;  Laterality: Left;      Current Facility-Administered Medications:    acetaminophen  (TYLENOL ) tablet 1,000 mg, 1,000 mg, Oral, Q8H, 1,000 mg at 02/15/24 1440  **OR** [DISCONTINUED] acetaminophen  (TYLENOL ) 160 MG/5ML solution 1,000 mg, 1,000 mg, Per Tube, Q8H **OR** [DISCONTINUED] acetaminophen  (TYLENOL ) suppository 975 mg, 975 mg, Rectal, Q8H, Dasie Leonor CROME, MD   amiodarone  (PACERONE ) tablet 200 mg, 200 mg, Oral, Daily, Georgina Basket, MD, 200 mg at 02/15/24 1045   atorvastatin  (LIPITOR) tablet 20 mg, 20 mg, Oral, Daily, Georgina Basket, MD, 20 mg at 02/15/24 1044   carvedilol  (COREG ) tablet 6.25 mg, 6.25 mg, Oral, BID, Georgina Basket, MD, 6.25 mg at 02/15/24 1045   Chlorhexidine  Gluconate Cloth 2 % PADS 6 each, 6 each, Topical, Daily, Georgina Basket, MD, 6 each at 02/15/24 1000   digoxin  (LANOXIN ) 0.25 MG/ML injection 0.25 mg, 0.25 mg, Intravenous, Q6H, Singh, Prashant K, MD   diltiazem  (CARDIZEM ) injection 10 mg,  10 mg, Intravenous, Q6H PRN, Singh, Prashant K, MD, 10 mg at 02/15/24 1556   lactated ringers  infusion, , Intravenous, Continuous, Singh, Prashant K, MD   multivitamin with minerals tablet 1 tablet, 1 tablet, Oral, Daily, Georgina Basket, MD, 1 tablet at 02/15/24 1045   oxyCODONE  (Oxy IR/ROXICODONE ) immediate release tablet 5 mg, 5 mg, Oral, Q6H PRN, Singh, Prashant K, MD, 5 mg at 02/15/24 1556   polyethylene glycol (MIRALAX  / GLYCOLAX ) packet 17 g, 17 g, Oral, Daily PRN, Shona Terry SAILOR, DO, 17 g at 02/14/24 2048   prochlorperazine  (COMPAZINE ) injection 5 mg, 5 mg, Intravenous, Q6H PRN, Hall, Carole N, DO   QUEtiapine  (SEROQUEL ) tablet 25 mg, 25 mg, Oral, QHS, Singh, Prashant K, MD, 25 mg at 02/14/24 2048   tamsulosin  (FLOMAX ) capsule 0.4 mg, 0.4 mg, Oral, Daily, Singh, Prashant K, MD, 0.4 mg at 02/15/24 1045   traMADol  (ULTRAM ) tablet 50 mg, 50 mg, Oral, Q12H PRN, Singh, Prashant K, MD, 50 mg at 02/15/24 1044  acetaminophen   1,000 mg Oral Q8H   amiodarone   200 mg Oral Daily   atorvastatin   20 mg Oral Daily   carvedilol   6.25 mg Oral BID   Chlorhexidine  Gluconate Cloth  6 each Topical Daily   digoxin   0.25 mg Intravenous Q6H   multivitamin with  minerals  1 tablet Oral Daily   QUEtiapine   25 mg Oral QHS   tamsulosin   0.4 mg Oral Daily    lactated ringers       Physical Exam: Blood pressure (!) 146/98, pulse 94, temperature 98 F (36.7 C), temperature source Oral, resp. rate 17, height 6' 2 (1.88 m), weight 69 kg, SpO2 98%.   Elderly male Lungs clear Mild AS murmur Prior bilateral THR Plus one dry edema LE;s with venous stasis changes   Labs:   Lab Results  Component Value Date   WBC 8.9 02/15/2024   HGB 7.9 (L) 02/15/2024   HCT 25.7 (L) 02/15/2024   MCV 83.7 02/15/2024   PLT 136 (L) 02/15/2024    Recent Labs  Lab 02/13/24 0213 02/14/24 0649 02/15/24 0528  NA 140   < > 143  K 3.7   < > 3.8  CL 108   < > 112*  CO2 22   < > 25  BUN 20   < > 18  CREATININE 1.10   < > 0.98  CALCIUM  8.0*   < > 8.1*  PROT 5.2*  --   --   BILITOT 0.8  --   --   ALKPHOS 46  --   --   ALT 29  --   --   AST 39  --   --   GLUCOSE 112*   < > 101*   < > = values in this interval not displayed.   Lab Results  Component Value Date   CKTOTAL 150 03/31/2023    Lab Results  Component Value Date   CHOL 115 10/17/2021   CHOL 126 12/18/2020   CHOL 126 10/25/2019   Lab Results  Component Value Date   HDL 47 10/17/2021   HDL 51 12/18/2020   HDL 56 10/25/2019   Lab Results  Component Value Date   LDLCALC 57 10/17/2021   LDLCALC 63 12/18/2020   LDLCALC 60 10/25/2019   Lab Results  Component Value Date   TRIG 47 10/17/2021   TRIG 52 12/18/2020   TRIG 40 10/25/2019   Lab Results  Component Value Date   CHOLHDL 2.4 10/17/2021  CHOLHDL 2.5 12/18/2020   CHOLHDL 2.3 10/25/2019   No results found for: LDLDIRECT    Radiology: DG Pelvis Comp Min 3V Result Date: 02/14/2024 CLINICAL DATA:  Close pelvic fracture. EXAM: JUDET PELVIS - 3+ VIEW COMPARISON:  CT chest, abdomen, and pelvis dated 02/13/2024. FINDINGS: Displaced fractures of the right superior pubic ramus and the left superior and inferior pubic rami. Right sacral  ala fracture is better visualized on the prior CT. Bilateral total hip arthroplasties remain aligned. Degenerative changes of the visualized lower lumbar spine. Diffuse osseous demineralization. IMPRESSION: Displaced fractures of the left superior and inferior pubic rami in the right superior pubic ramus. Right sacral ala fracture is better visualized on the prior CT. Electronically Signed   By: Harrietta Sherry M.D.   On: 02/14/2024 12:19   CT L-SPINE NO CHARGE Addendum Date: 02/13/2024 ADDENDUM REPORT: 02/13/2024 04:56 ADDENDUM: A transcription error resulted in replacement of the appropriate dictation for this examination. The findings and impression are as follows: Extensive multi-vessel coronary artery calcification. Mild global cardiomegaly. Small pericardial effusion. Central pulmonary arteries are enlarged in keeping with changes of pulmonary arterial hypertension. 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 Superimposed moderate atherosclerotic calcification of the thoracic aorta. Visualized thyroid  is unremarkable. No pathologic thoracic adenopathy. Esophagus is unremarkable. No mediastinal hematoma. No pneumomediastinum. Small bilateral pleural effusions are present, right greater than left. Mild bibasilar compressive atelectasis. No confluent pulmonary infiltrate. No pneumothorax. No central obstructing lesion. Thoracic dextroscoliosis noted. No acute bone abnormality within the thorax. Liver, gallbladder, pancreas, spleen, and left adrenal glands are unremarkable. 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 Severe left renal cortical atrophy the right kidney is normal in size and position. Multiple simple and mildly complex cortical cysts are seen within the left kidney for which  no follow-up imaging is recommended. No hydronephrosis. No intrarenal or ureteral calculi. Streak artifact limits evaluation of the bladder. The bladder, however, appears distended and is markedly thick walled suggesting superimposed diffuse infectious or inflammatory cystitis. Mild distal colonic and cecal diverticulosis. Stomach, small bowel, and large bowel otherwise unremarkable. No free intraperitoneal gas or fluid. Appendix normal. Mild aortoiliac atherosclerotic calcification. No aortic aneurysm. No pathologic adenopathy within the abdomen and pelvis. 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 There are acute fractures of the left superior and inferior pubic rami as well as the right superior pubic ramus. Mild override the left pubic rami fractures is in keeping with a lateral compression type injury. Subacute fracture of the right sacral ala with resorption along the fracture margin. There is infiltration within the space of Retzius surrounding the bladder in keeping with probable extraperitoneal hemorrhage adjacent to the left superior pubic symphyseal fracture. No definite active extravasation identified though imaging is limited by streak artifact. Impression; Acute fractures of the right superior and left superior and inferior pubic rami with mild displacement suggested a lateral compression type injury. Adjacent extraperitoneal hemorrhage within the space of Retzius without definite active extravasation identified. Circumferential bladder wall thickening suggestive of a diffuse infectious or inflammatory cystitis. Additional incidental findings as noted above. Electronically Signed   By: Dorethia Molt M.D.   On: 02/13/2024 04:56   Result Date: 02/13/2024 CLINICAL DATA:  Fall, low back pain EXAM: CT LUMBAR SPINE WITHOUT CONTRAST TECHNIQUE: Multidetector CT imaging of the  lumbar spine was performed without intravenous contrast administration. Multiplanar CT image reconstructions were also  generated. RADIATION DOSE REDUCTION: This exam was performed according to the departmental dose-optimization program which includes automated exposure control, adjustment of the mA and/or kV according to patient size and/or use of iterative reconstruction technique. COMPARISON:  None Available. 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 Alignment: Moderate lumbar levoscoliosis, apex left at L3. Normal lumbar lordosis. No listhesis. Vertebrae: No acute fracture of the lumbar spine; vertebral body height is preserved. No lytic or blastic bone lesion. Acute to subacute fracture of the right sacral ala noted with mild override of the fracture fragments suggestive of a lateral compression type injury. Osseous structures are diffusely osteopenic. Paraspinal and other soft tissues: See accompanying report for CT examination of the chest, abdomen, and pelvis. No paraspinal fluid collection or inflammatory change identified. Disc levels: Intervertebral disc space narrowing and endplate remodeling at L3-S1 is present in keeping with changes of advanced degenerative disc disease. Axial images demonstrate: L1-2: Unremarkable L2-3: Mild broad-based disc bulge and mild bilateral laminar hypertrophy result in mild central canal stenosis. No significant neuroforaminal narrowing. Mild bilateral facet arthrosis. L3-4: Posterior disc osteophyte complex. Mild bilateral facet arthrosis with associated hypertrophy. Resultant moderate central canal stenosis within the sub foraminal zone. No significant neuroforaminal narrowing. Impingement the right lateral recess with possible impingement of the crossing right L4 nerve root L4-5: Broad disc osteophyte complex, advanced bilateral facet arthrosis, associated facet hypertrophy, and laminar hypertrophy result in severe central canal stenosis within the sub foraminal zone. Mild right and moderate left neuroforaminal narrowing. Impingement of the  right lateral recess probable impingement of the crossing right L5 nerve root. Possible impingement the crossing left L5 nerve root. L5-S1: Bilateral facet arthrosis, severe on the left. No significant neuroforaminal narrowing or canal stenosis. IMPRESSION: 1. Acute to subacute fracture of the right sacral ala with mild override of the fracture fragments suggestive of a lateral compression type injury. 2. No acute fracture or malalignment of the lumbar spine. 3. 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 4. Impingement of the right lateral recesses at L3-4 and L4-5 with possible impingement of the crossing right L4 and L5 nerve roots. 5. Multilevel neuroforaminal narrowing, moderate on the left at L4-5. 6. 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 7. 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 Electronically Signed: By: Dorethia Molt M.D. On: 02/13/2024 04:26   CT T-SPINE NO CHARGE Addendum Date: 02/13/2024 ADDENDUM REPORT: 02/13/2024 04:56 ADDENDUM: A transcription error resulted in replacement of the appropriate dictation for this examination. The findings and impression are as follows: Extensive multi-vessel coronary artery calcification. Mild global cardiomegaly. Small pericardial effusion. Central pulmonary arteries are enlarged in keeping with changes of pulmonary arterial hypertension. 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 Superimposed moderate atherosclerotic calcification of the thoracic aorta. Visualized thyroid  is unremarkable. No pathologic thoracic adenopathy. Esophagus is unremarkable. No mediastinal hematoma. No pneumomediastinum. Small bilateral pleural effusions are present, right greater than left. Mild bibasilar compressive atelectasis. No confluent pulmonary  infiltrate. No pneumothorax. No central obstructing lesion. Thoracic dextroscoliosis noted. No acute bone abnormality within the thorax. Liver, gallbladder, pancreas, spleen, and left adrenal glands are unremarkable. Stable 3 cm nodule within the right adrenal gland since prior examination of 08/16/2008 compatible with a  benign adrenal adenoma given its stability over time. Severe left renal cortical atrophy the right kidney is normal in size and position. Multiple simple and mildly complex cortical cysts are seen within the left kidney for which no follow-up imaging is recommended. No hydronephrosis. No intrarenal or ureteral calculi. Streak artifact limits evaluation of the bladder. The bladder, however, appears distended and is markedly thick walled suggesting superimposed diffuse infectious or inflammatory cystitis. Mild distal colonic and cecal diverticulosis. Stomach, small bowel, and large bowel otherwise unremarkable. No free intraperitoneal gas or fluid. Appendix normal. Mild aortoiliac atherosclerotic calcification. No aortic aneurysm. No pathologic adenopathy within the abdomen and pelvis. 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 There are acute fractures of the left superior and inferior pubic rami as well as the right superior pubic ramus. Mild override the left pubic rami fractures is in keeping with a lateral compression type injury. Subacute fracture of the right sacral ala with resorption along the fracture margin. There is infiltration within the space of Retzius surrounding the bladder in keeping with probable extraperitoneal hemorrhage adjacent to the left superior pubic symphyseal fracture. No definite active extravasation identified though imaging is limited by streak artifact. Impression; Acute fractures of the right superior and left superior and inferior pubic rami with mild displacement suggested a lateral compression type injury. Adjacent extraperitoneal hemorrhage within the  space of Retzius without definite active extravasation identified. Circumferential bladder wall thickening suggestive of a diffuse infectious or inflammatory cystitis. Additional incidental findings as noted above. Electronically Signed   By: Dorethia Molt M.D.   On: 02/13/2024 04:56   Result Date: 02/13/2024 CLINICAL DATA:  Fall, low back pain EXAM: CT LUMBAR SPINE WITHOUT CONTRAST TECHNIQUE: Multidetector CT imaging of the lumbar spine was performed without intravenous contrast administration. Multiplanar CT image reconstructions were also generated. RADIATION DOSE REDUCTION: This exam was performed according to the departmental dose-optimization program which includes automated exposure control, adjustment of the mA and/or kV according to patient size and/or use of iterative reconstruction technique. COMPARISON:  None Available. 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 Alignment: Moderate lumbar levoscoliosis, apex left at L3. Normal lumbar lordosis. No listhesis. Vertebrae: No acute fracture of the lumbar spine; vertebral body height is preserved. No lytic or blastic bone lesion. Acute to subacute fracture of the right sacral ala noted with mild override of the fracture fragments suggestive of a lateral compression type injury. Osseous structures are diffusely osteopenic. Paraspinal and other soft tissues: See accompanying report for CT examination of the chest, abdomen, and pelvis. No paraspinal fluid collection or inflammatory change identified. Disc levels: Intervertebral disc space narrowing and endplate remodeling at L3-S1 is present in keeping with changes of advanced degenerative disc disease. Axial images demonstrate: L1-2: Unremarkable L2-3: Mild broad-based disc bulge and mild bilateral laminar hypertrophy result in mild central canal stenosis. No significant neuroforaminal narrowing. Mild bilateral facet arthrosis. L3-4: Posterior disc osteophyte  complex. Mild bilateral facet arthrosis with associated hypertrophy. Resultant moderate central canal stenosis within the sub foraminal zone. No significant neuroforaminal narrowing. Impingement the right lateral recess with possible impingement of the crossing right L4 nerve root L4-5: Broad disc osteophyte complex, advanced bilateral facet arthrosis, associated facet hypertrophy, and laminar hypertrophy result in severe central canal stenosis within the sub foraminal zone. Mild right and moderate left neuroforaminal narrowing. Impingement of the right lateral recess probable impingement of the crossing right L5 nerve root. Possible impingement the crossing left L5 nerve root. L5-S1: Bilateral facet arthrosis, severe on  the left. No significant neuroforaminal narrowing or canal stenosis. IMPRESSION: 1. Acute to subacute fracture of the right sacral ala with mild override of the fracture fragments suggestive of a lateral compression type injury. 2. No acute fracture or malalignment of the lumbar spine. 3. 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 4. Impingement of the right lateral recesses at L3-4 and L4-5 with possible impingement of the crossing right L4 and L5 nerve roots. 5. Multilevel neuroforaminal narrowing, moderate on the left at L4-5. 6. 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 7. 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 Electronically Signed: By: Dorethia Molt M.D. On: 02/13/2024 04:26   CT CHEST ABDOMEN PELVIS W CONTRAST Addendum Date: 02/13/2024 ADDENDUM REPORT: 02/13/2024 04:56 ADDENDUM: A transcription error resulted in replacement of the appropriate dictation for this examination. The findings and impression are as follows: Extensive multi-vessel coronary artery calcification. Mild global cardiomegaly. Small pericardial effusion. Central pulmonary arteries are enlarged  in keeping with changes of pulmonary arterial hypertension. 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 Superimposed moderate atherosclerotic calcification of the thoracic aorta. Visualized thyroid  is unremarkable. No pathologic thoracic adenopathy. Esophagus is unremarkable. No mediastinal hematoma. No pneumomediastinum. Small bilateral pleural effusions are present, right greater than left. Mild bibasilar compressive atelectasis. No confluent pulmonary infiltrate. No pneumothorax. No central obstructing lesion. Thoracic dextroscoliosis noted. No acute bone abnormality within the thorax. Liver, gallbladder, pancreas, spleen, and left adrenal glands are unremarkable. 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 Severe left renal cortical atrophy the right kidney is normal in size and position. Multiple simple and mildly complex cortical cysts are seen within the left kidney for which no follow-up imaging is recommended. No hydronephrosis. No intrarenal or ureteral calculi. Streak artifact limits evaluation of the bladder. The bladder, however, appears distended and is markedly thick walled suggesting superimposed diffuse infectious or inflammatory cystitis. Mild distal colonic and cecal diverticulosis. Stomach, small bowel, and large bowel otherwise unremarkable. No free intraperitoneal gas or fluid. Appendix normal. Mild aortoiliac atherosclerotic calcification. No aortic aneurysm. No pathologic adenopathy within the abdomen and pelvis. 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 There are acute fractures of the left superior and inferior pubic rami as well as the right superior pubic ramus. Mild override the left pubic rami fractures is in keeping with a lateral compression type  injury. Subacute fracture of the right sacral ala with resorption along the fracture margin. There is infiltration within the space of Retzius surrounding the bladder in keeping with probable extraperitoneal hemorrhage adjacent to the left superior pubic symphyseal fracture. No definite active extravasation identified though imaging is limited by streak artifact. Impression; Acute fractures of the right superior and left superior and inferior pubic rami with mild displacement suggested a lateral compression type injury. Adjacent extraperitoneal hemorrhage within the space of Retzius without definite active extravasation identified. Circumferential bladder wall thickening suggestive of a diffuse infectious or inflammatory cystitis. Additional incidental findings as noted above. Electronically Signed   By: Dorethia Molt M.D.   On: 02/13/2024 04:56   Result Date: 02/13/2024 CLINICAL DATA:  Fall, low back pain EXAM: CT LUMBAR SPINE WITHOUT CONTRAST TECHNIQUE: Multidetector CT imaging of the lumbar spine was performed without intravenous contrast  administration. Multiplanar CT image reconstructions were also generated. RADIATION DOSE REDUCTION: This exam was performed according to the departmental dose-optimization program which includes automated exposure control, adjustment of the mA and/or kV according to patient size and/or use of iterative reconstruction technique. COMPARISON:  None Available. 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 Alignment: Moderate lumbar levoscoliosis, apex left at L3. Normal lumbar lordosis. No listhesis. Vertebrae: No acute fracture of the lumbar spine; vertebral body height is preserved. No lytic or blastic bone lesion. Acute to subacute fracture of the right sacral ala noted with mild override of the fracture fragments suggestive of a lateral compression type injury. Osseous structures are diffusely osteopenic. Paraspinal and other soft  tissues: See accompanying report for CT examination of the chest, abdomen, and pelvis. No paraspinal fluid collection or inflammatory change identified. Disc levels: Intervertebral disc space narrowing and endplate remodeling at L3-S1 is present in keeping with changes of advanced degenerative disc disease. Axial images demonstrate: L1-2: Unremarkable L2-3: Mild broad-based disc bulge and mild bilateral laminar hypertrophy result in mild central canal stenosis. No significant neuroforaminal narrowing. Mild bilateral facet arthrosis. L3-4: Posterior disc osteophyte complex. Mild bilateral facet arthrosis with associated hypertrophy. Resultant moderate central canal stenosis within the sub foraminal zone. No significant neuroforaminal narrowing. Impingement the right lateral recess with possible impingement of the crossing right L4 nerve root L4-5: Broad disc osteophyte complex, advanced bilateral facet arthrosis, associated facet hypertrophy, and laminar hypertrophy result in severe central canal stenosis within the sub foraminal zone. Mild right and moderate left neuroforaminal narrowing. Impingement of the right lateral recess probable impingement of the crossing right L5 nerve root. Possible impingement the crossing left L5 nerve root. L5-S1: Bilateral facet arthrosis, severe on the left. No significant neuroforaminal narrowing or canal stenosis. IMPRESSION: 1. Acute to subacute fracture of the right sacral ala with mild override of the fracture fragments suggestive of a lateral compression type injury. 2. No acute fracture or malalignment of the lumbar spine. 3. 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 4. Impingement of the right lateral recesses at L3-4 and L4-5 with possible impingement of the crossing right L4 and L5 nerve roots. 5. Multilevel neuroforaminal narrowing, moderate on the left at L4-5. 6.  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 7. 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 Electronically Signed: By: Dorethia Molt M.D. On: 02/13/2024 04:26   CT Cervical Spine Wo Contrast Result Date: 02/13/2024 EXAM: CT CERVICAL SPINE WITHOUT CONTRAST 02/13/2024 03:58:59 AM TECHNIQUE: CT of the cervical spine was performed without the administration of intravenous contrast. Multiplanar reformatted images are provided for review. Automated exposure control, iterative reconstruction, and/or weight based adjustment of the mA/kV was utilized to reduce the radiation dose to as low as reasonably achievable. COMPARISON: None available. CLINICAL HISTORY: Neck trauma (Age >= 65y). No contrast. Lower back pain from a mechanical fall this afternoon. Didn't hit his head. No LOC. Takes eliquis . FINDINGS: CERVICAL SPINE: BONES AND ALIGNMENT: Reversal of the normal cervical lordosis is stable. Grade 1 anterolisthesis at C3-4 is stable. DEGENERATIVE CHANGES: Endplate degenerative changes and uncovertebral spurring results in moderate foraminal stenosis bilaterally at C4-5 and C5-6 and on the right at C6-7. SOFT TISSUES: No prevertebral soft tissue swelling. VASCULATURE: Atherosclerotic changes are present with carotid bifurcations bilaterally without definite stenosis. PLEURAL SPACES: Right pleural effusion and chronic right pleural calcifications are again noted. IMPRESSION: 1. No acute abnormality  of the cervical spine related to the reported neck trauma. 2. Stable reversal of the normal cervical lordosis and grade 1 anterolisthesis at C3-4. 3. 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 Electronically signed by: Lonni Necessary MD 02/13/2024 04:19 AM EDT RP Workstation: [id number]   CT Head Wo Contrast Result Date: 02/13/2024 EXAM: CT HEAD WITHOUT CONTRAST 02/13/2024 03:58:59 AM TECHNIQUE: CT  of the head was performed without the administration of intravenous contrast. Automated exposure control, iterative reconstruction, and/or weight based adjustment of the mA/kV was utilized to reduce the radiation dose to as low as reasonably achievable. COMPARISON: CT head without contrast 11/18/2023. CLINICAL HISTORY: Head trauma, moderate-severe. No contrast. Lower back pain from a mechanical fall this afternoon. Didn't hit his head. No LOC. Takes eliquis . FINDINGS: BRAIN AND VENTRICLES: No acute hemorrhage. Gray-white differentiation is preserved. No hydrocephalus. No extra-axial collection. No mass effect or midline shift. Mild atrophy and white matter changes are similar to prior study. ORBITS: No acute abnormality. SINUSES: No acute abnormality. SOFT TISSUES AND SKULL: No acute soft tissue abnormality. No skull fracture. VASCULATURE: Atherosclerotic calcifications are present in the cavernous carotid arteries bilaterally and at the dural margin of both vertebral arteries. No hyperdense vessel is present. IMPRESSION: 1. No acute intracranial abnormality. 2. Mild atrophy and white matter changes, similar to prior study. 3. Atherosclerosis Electronically signed by: Lonni Necessary MD 02/13/2024 04:16 AM EDT RP Workstation: [id number]   DG Chest Portable 1 View Result Date: 02/13/2024 CLINICAL DATA:  Recent fall with chest pain, initial encounter EXAM: PORTABLE CHEST 1 VIEW COMPARISON:  11/18/2023 FINDINGS: Cardiac shadow is enlarged. Aortic calcifications are noted. The lungs are well aerated without focal infiltrate. Chronic scarring in the right base is seen. No pneumothorax is noted. No acute bony abnormality is seen. IMPRESSION: Chronic changes without acute abnormality. Electronically Signed   By: Oneil Devonshire M.D.   On: 02/13/2024 00:08   DG Elbow Complete Right Result Date: 02/13/2024 CLINICAL DATA:  Recent fall with right elbow pain, initial encounter EXAM: RIGHT ELBOW - COMPLETE 3+ VIEW  COMPARISON:  None Available. FINDINGS: There is no evidence of fracture, dislocation, or joint effusion. There is no evidence of arthropathy or other focal bone abnormality. Soft tissues are unremarkable. IMPRESSION: No acute abnormality noted. Electronically Signed   By: Oneil Devonshire M.D.   On: 02/13/2024 00:08   DG Knee Right Port Result Date: 02/13/2024 CLINICAL DATA:  Recent fall with right knee pain, initial encounter EXAM: PORTABLE RIGHT KNEE - 2 VIEW COMPARISON:  08/31/2023 FINDINGS: Degenerative changes are noted in the medial joint space. Diffuse soft tissue swelling is noted. No acute fracture or dislocation is identified. No joint effusion is seen. IMPRESSION: Degenerative change without acute abnormality. Electronically Signed   By: Oneil Devonshire M.D.   On: 02/13/2024 00:07   DG Knee Left Port Result Date: 02/13/2024 CLINICAL DATA:  Recent fall with knee pain, initial encounter EXAM: PORTABLE LEFT KNEE - 2 VIEW COMPARISON:  None Available. 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 No acute fracture or dislocation is noted. No joint effusion is seen. Diffuse vascular calcifications are noted. IMPRESSION: No acute abnormality noted. Electronically Signed   By: Oneil Devonshire M.D.   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 Lucency  is noted over the superior pubic rami bilaterally suspicious for undisplaced fractures. CT would be helpful for further evaluation. No other bony abnormality is seen. IMPRESSION: Findings suspicious for bilateral superior pubic rami fractures. CT would be helpful in this regard. Electronically Signed   By: Oneil Devonshire M.D.   On: 02/13/2024 00:06    EKG: afib no acute changes   ASSESSMENT AND PLAN:   Afib:  PAF but mostly afib with SSS. Sensitive to beta blockers  Getting iv dig and bolus iv cardizem . Start oral cardizem  30 mg q 8 hours. Hold eliquis  for extra peritoneal bleed. Turned down by Dr Cindie for Watchman due to age and LE venous ulcers He is getting coreg  6.25 bid will need to watch for post conversion pauses as he has known SSS and beta blocker stopped in past CAD: distant stent RCA 2007 low risk myovue 2022 no angina observe  Signed: Maude Emmer 02/15/2024, 4:03 PM

## 2024-02-15 NOTE — Progress Notes (Addendum)
 PROGRESS NOTE                                                                                                                                                                                                             Patient Demographics:    [name], is a 83 y.o. male, DOB - 1940-09-17, FMW:[id number]  Outpatient Primary MD for the patient is Rexanne Ingle, MD    LOS - 2  Admit date - 02/12/2024    Chief Complaint  Patient presents with   Fall       Brief Narrative (HPI from H&P)   83 y.o. male with medical history significant of CAD, HFpEF, PAF, and HTN p/w GLF c/b b/l pubic rami fractures and found to have symptomatic anemia.   Pt was in his USOH until yesterday evening around 1700 when he fell. Pt reports bending over and reaching for an item in the lower cabinet, when he lost balance, and fell backwards onto his butt (no LOC or head strike). He was able to get himself off the ground before calling EMS. Pt denied any lightheadedness or dizziness prior to his fall. Of note, pt reports taking OAC for known Afib.   In the ED, pt tachycardic and hypertensive. Labs notable for Hb 6.3, Fe 34(low), TIBC 371 (nl), % sat 9 (low), and ferritin 13 (low). FOBT neg. CTH w/ NAICA. CT chest/abd/pelvis showed acute fractures of the right superior, left superior and inferior pubic rami with mild displacement suggested a lateral compression type injury, and adjacent extraperitoneal hemorrhage. EDP consulted Trauma/Ortho sx who recommended PT/OT evaluation, blood transfusion, and medicine admission.   Subjective:   Patient in bed, appears comfortable, denies any headache, no fever, no chest pain or pressure, no shortness of breath , no abdominal pain. No focal weakness.  Mild back pain.   Assessment  & Plan :    Mechanical fall due to loss of balance causing B/l pubic rami fracture c/b adjacent hematoma and acute blood loss  anemia -Trauma and Ortho consulted; it is post 2 units of packed RBC transfusion on 02/13/2024, CBC currently stable, for now continue to hold Eliquis , supportive care, PT OT, Ortho and trauma following.    Symptomatic anemia  - As above   Chronic urinary retention  Pt with I/O cath prn pta, -Foley placed 7/27, add Flomax   and monitor.   PAF [place] vas 2 score of greater than 3  - HOLD pta apixaban  for now, PTA amiodarone  200mg  daily and Coreg  6.25mg  BID.  Mild RVR - IVF, pain control, PRN IV cardizem , 2 doses of IV Digoxin , check TSH, Cards input.     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  Gets delirious at home during nighttime, delirium likely will get worse in the hospital due to metabolic encephalopathy, nighttime Seroquel  and monitor.  Family updated in detail.      Condition - Extremely Guarded  Family Communication  : daughter Karyn [phone]  on 02/14/24, 02/15/2024  Code Status :  Full  Consults  :   Trauma, Ortho  PUD Prophylaxis :     Procedures  :            Disposition Plan  :    Status is: Inpatient   DVT Prophylaxis  :  SCDs    Lab Results  Component Value Date   PLT 136 (L) 02/15/2024    Diet :  Diet Order             Diet regular Room service appropriate? Yes; Fluid consistency: Thin  Diet effective now                    Inpatient Medications  Scheduled Meds:  acetaminophen   1,000 mg Oral Q8H   amiodarone   200 mg Oral Daily   atorvastatin   20 mg Oral Daily   carvedilol   6.25 mg Oral BID   Chlorhexidine  Gluconate Cloth  6 each Topical Daily   multivitamin with minerals  1 tablet Oral Daily   QUEtiapine   25 mg Oral QHS   tamsulosin   0.4 mg Oral Daily   Continuous Infusions:   PRN Meds:.oxyCODONE , polyethylene glycol, prochlorperazine   Antibiotics  :    Anti-infectives (From admission, onward)    None         Objective:   Vitals:   02/14/24 2328 02/15/24 0359 02/15/24 0400 02/15/24  0811  BP: 126/78  (!) 142/80 136/73  Pulse: 97   95  Resp: 13 14 (!) 23 (!) 21  Temp: 98.4 F (36.9 C)  98 F (36.7 C) 98.5 F (36.9 C)  TempSrc: Axillary  Oral Oral  SpO2: 94%  97% 99%  Weight:      Height:        Wt Readings from Last 3 Encounters:  02/12/24 69 kg  01/25/24 68.9 kg  11/18/23 68 kg     Intake/Output Summary (Last 24 hours) at 02/15/2024 0903 Last data filed at 02/15/2024 0500 Gross per 24 hour  Intake 870 ml  Output 350 ml  Net 520 ml     Physical Exam  Awake - but confused, No new F.N deficits, Normal affect [place].AT,PERRAL Supple Neck, No JVD,   Symmetrical Chest wall movement, Good air movement bilaterally, CTAB RRR,No Gallops,Rubs or new Murmurs,  +ve B.Sounds, Abd Soft, No tenderness,   No Cyanosis, Clubbing or edema        Data Review:    Recent Labs  Lab 02/13/24 0213 02/13/24 1832 02/14/24 0649 02/15/24 0528  WBC 10.3 7.7 11.3* 8.9  HGB 6.3* 8.3* 8.6* 7.9*  HCT 21.6* 27.1* 27.4* 25.7*  PLT 183 160 145* 136*  MCV 82.4 83.6 83.5 83.7  MCH 24.0* 25.6* 26.2 25.7*  MCHC 29.2* 30.6 31.4 30.7  RDW 17.7* 17.0* 17.8* 18.4*  LYMPHSABS 0.5*  --  0.5* 0.7  MONOABS 0.6  --  0.7 0.5  EOSABS 0.0  --  0.1 0.2  BASOSABS 0.0  --  0.0 0.0    Recent Labs  Lab 02/13/24 0213 02/14/24 0649 02/15/24 0528  NA 140 140 143  K 3.7 4.4 3.8  CL 108 108 112*  CO2 22 21* 25  ANIONGAP 10 11 6   GLUCOSE 112* 103* 101*  BUN 20 19 18   CREATININE 1.10 1.01 0.98  AST 39  --   --   ALT 29  --   --   ALKPHOS 46  --   --   BILITOT 0.8  --   --   ALBUMIN 3.0*  --   --   MG  --  2.1 2.2  CALCIUM  8.0* 8.0* 8.1*      Recent Labs  Lab 02/13/24 0213 02/14/24 0649 02/15/24 0528  MG  --  2.1 2.2  CALCIUM  8.0* 8.0* 8.1*    --------------------------------------------------------------------------------------------------------------- Lab Results  Component Value Date   CHOL 115 10/17/2021   HDL 47 10/17/2021   LDLCALC 57 10/17/2021   TRIG 47  10/17/2021   CHOLHDL 2.4 10/17/2021    Lab Results  Component Value Date   HGBA1C 5.8 (H) 03/25/2023   No results for input(s): TSH, T4TOTAL, FREET4, T3FREE, THYROIDAB in the last 72 hours. Recent Labs    02/13/24 0330 02/13/24 0415  VITAMINB12 119*  --   FOLATE 15.4  --   FERRITIN  --  13*  TIBC  --  371  IRON  --  34*  RETICCTPCT 1.8  --       Radiology Report DG Pelvis Comp Min 3V Result Date: 02/14/2024 CLINICAL DATA:  Close pelvic fracture. EXAM: JUDET PELVIS - 3+ VIEW COMPARISON:  CT chest, abdomen, and pelvis dated 02/13/2024. FINDINGS: Displaced fractures of the right superior pubic ramus and the left superior and inferior pubic rami. Right sacral ala fracture is better visualized on the prior CT. Bilateral total hip arthroplasties remain aligned. Degenerative changes of the visualized lower lumbar spine. Diffuse osseous demineralization. IMPRESSION: Displaced fractures of the left superior and inferior pubic rami in the right superior pubic ramus. Right sacral ala fracture is better visualized on the prior CT. Electronically Signed   By: Harrietta Sherry M.D.   On: 02/14/2024 12:19     Signature  -   Lavada Stank M.D on 02/15/2024 at 9:03 AM   -  To page go to [email or web address]

## 2024-02-16 ENCOUNTER — Inpatient Hospital Stay (HOSPITAL_COMMUNITY)

## 2024-02-16 DIAGNOSIS — R918 Other nonspecific abnormal finding of lung field: Secondary | ICD-10-CM | POA: Diagnosis not present

## 2024-02-16 DIAGNOSIS — I4819 Other persistent atrial fibrillation: Secondary | ICD-10-CM | POA: Diagnosis not present

## 2024-02-16 DIAGNOSIS — W19XXXA Unspecified fall, initial encounter: Secondary | ICD-10-CM | POA: Diagnosis not present

## 2024-02-16 DIAGNOSIS — I700 Atherosclerosis of aorta: Secondary | ICD-10-CM | POA: Diagnosis not present

## 2024-02-16 DIAGNOSIS — J90 Pleural effusion, not elsewhere classified: Secondary | ICD-10-CM | POA: Diagnosis not present

## 2024-02-16 DIAGNOSIS — D649 Anemia, unspecified: Secondary | ICD-10-CM | POA: Diagnosis not present

## 2024-02-16 DIAGNOSIS — R0602 Shortness of breath: Secondary | ICD-10-CM

## 2024-02-16 LAB — CBC WITH DIFFERENTIAL/PLATELET
Abs Immature Granulocytes: 0.05 K/uL (ref 0.00–0.07)
Basophils Absolute: 0 K/uL (ref 0.0–0.1)
Basophils Relative: 0 %
Eosinophils Absolute: 0.2 K/uL (ref 0.0–0.5)
Eosinophils Relative: 2 %
HCT: 26.7 % — ABNORMAL LOW (ref 39.0–52.0)
Hemoglobin: 8.1 g/dL — ABNORMAL LOW (ref 13.0–17.0)
Immature Granulocytes: 1 %
Lymphocytes Relative: 5 %
Lymphs Abs: 0.4 K/uL — ABNORMAL LOW (ref 0.7–4.0)
MCH: 25.8 pg — ABNORMAL LOW (ref 26.0–34.0)
MCHC: 30.3 g/dL (ref 30.0–36.0)
MCV: 85 fL (ref 80.0–100.0)
Monocytes Absolute: 0.5 K/uL (ref 0.1–1.0)
Monocytes Relative: 6 %
Neutro Abs: 7.3 K/uL (ref 1.7–7.7)
Neutrophils Relative %: 86 %
Platelets: 143 K/uL — ABNORMAL LOW (ref 150–400)
RBC: 3.14 MIL/uL — ABNORMAL LOW (ref 4.22–5.81)
RDW: 18.9 % — ABNORMAL HIGH (ref 11.5–15.5)
WBC: 8.5 K/uL (ref 4.0–10.5)
nRBC: 0.5 % — ABNORMAL HIGH (ref 0.0–0.2)

## 2024-02-16 LAB — BASIC METABOLIC PANEL WITH GFR
Anion gap: 7 (ref 5–15)
BUN: 20 mg/dL (ref 8–23)
CO2: 21 mmol/L — ABNORMAL LOW (ref 22–32)
Calcium: 7.9 mg/dL — ABNORMAL LOW (ref 8.9–10.3)
Chloride: 112 mmol/L — ABNORMAL HIGH (ref 98–111)
Creatinine, Ser: 0.92 mg/dL (ref 0.61–1.24)
GFR, Estimated: >60 mL/min (ref >60)
Glucose, Bld: 111 mg/dL — ABNORMAL HIGH (ref 70–99)
Potassium: 4.2 mmol/L (ref 3.5–5.1)
Sodium: 140 mmol/L (ref 135–145)

## 2024-02-16 LAB — MAGNESIUM: Magnesium: 2.1 mg/dL (ref 1.7–2.4)

## 2024-02-16 MED ORDER — FUROSEMIDE 10 MG/ML IJ SOLN: 20.0000 mg | Freq: Once | INTRAMUSCULAR | Status: DC

## 2024-02-16 MED ORDER — HEPARIN SODIUM (PORCINE) 5000 UNIT/ML IJ SOLN
5000.0000 [IU] | Freq: Three times a day (TID) | INTRAMUSCULAR | Status: DC
  Administered 2024-02-16 – 2024-02-20 (×13): 5000 [IU] via SUBCUTANEOUS
  Filled 2024-02-16 (×13): qty 1

## 2024-02-16 MED ORDER — IPRATROPIUM-ALBUTEROL 0.5-2.5 (3) MG/3ML IN SOLN
3.0000 mL | Freq: Once | RESPIRATORY_TRACT | Status: AC
  Administered 2024-02-16: 3 mL via RESPIRATORY_TRACT
  Filled 2024-02-16: qty 3

## 2024-02-16 MED ORDER — FUROSEMIDE 10 MG/ML IJ SOLN
40.0000 mg | Freq: Once | INTRAMUSCULAR | Status: AC
  Administered 2024-02-16: 40 mg via INTRAVENOUS
  Filled 2024-02-16: qty 4

## 2024-02-16 NOTE — Progress Notes (Signed)
 PROGRESS NOTE                                                                                                                                                                                                             Patient Demographics:    [name], is a 83 y.o. male, DOB - [birth year], FMW:[id number]  Outpatient Primary MD for the patient is Rexanne Ingle, MD    LOS - 3  Admit date - 02/12/2024    Chief Complaint  Patient presents with   Fall       Brief Narrative (HPI from H&P)   83 y.o. male with medical history significant of CAD, HFpEF, PAF, and HTN p/w GLF c/b b/l pubic rami fractures and found to have symptomatic anemia.   Pt was in his USOH until yesterday evening around 1700 when he fell. Pt reports bending over and reaching for an item in the lower cabinet, when he lost balance, and fell backwards onto his butt (no LOC or head strike). He was able to get himself off the ground before calling EMS. Pt denied any lightheadedness or dizziness prior to his fall. Of note, pt reports taking OAC for known Afib.   In the ED, pt tachycardic and hypertensive. Labs notable for Hb 6.3, Fe 34(low), TIBC 371 (nl), % sat 9 (low), and ferritin 13 (low). FOBT neg. CTH w/ NAICA. CT chest/abd/pelvis showed acute fractures of the right superior, left superior and inferior pubic rami with mild displacement suggested a lateral compression type injury, and adjacent extraperitoneal hemorrhage. EDP consulted Trauma/Ortho sx who recommended PT/OT evaluation, blood transfusion, and medicine admission.   Subjective:   Patient in bed, appears comfortable, denies any headache, no fever, no chest pain or pressure, no shortness of breath , no abdominal pain. No focal weakness.  Mild back pain.   Assessment  & Plan :    Mechanical fall due to loss of balance causing B/l pubic rami fracture c/b adjacent hematoma and acute blood loss  anemia -Trauma and Ortho consulted; it is post 2 units of packed RBC transfusion on 02/13/2024, CBC currently stable, for now continue to hold Eliquis , supportive care, PT OT, Ortho and trauma following.  Will require SNF.    Symptomatic anemia  - As above   Chronic urinary retention  Pt with I/O cath prn pta, -Foley  placed 7/27, add Flomax  and monitor.   PAF [place] vas 2 score of greater than 3, had some RVR- HOLD pta apixaban  for now, continued on PTA amiodarone  200mg  daily and Coreg  6.25mg  BID, also received 2 doses of IV digoxin  on 02/15/2024, was subsequently seen by cardiology who added low-dose Cardizem  as well for better rate control, he was also hydrated with some IV fluids and pain was better controlled, heart rate much improved, likely will go up if he is exerting or having pain, patient and family informed.  TSH and free T4 stable.   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  Gets delirious at home during nighttime, delirium likely will get worse in the hospital due to metabolic encephalopathy, nighttime Seroquel  and monitor.  Family updated in detail.      Condition - Extremely Guarded  Family Communication  : daughter Karyn [phone]  on 02/14/24, 02/15/2024  Code Status :  Full  Consults  :   Trauma, Ortho, cardiology  PUD Prophylaxis :     Procedures  :            Disposition Plan  :    Status is: Inpatient   DVT Prophylaxis  :  SCDs    Lab Results  Component Value Date   PLT 143 (L) 02/16/2024    Diet :  Diet Order             Diet regular Room service appropriate? Yes; Fluid consistency: Thin  Diet effective now                    Inpatient Medications  Scheduled Meds:  acetaminophen   1,000 mg Oral Q8H   amiodarone   200 mg Oral Daily   atorvastatin   20 mg Oral Daily   carvedilol   6.25 mg Oral BID   Chlorhexidine  Gluconate Cloth  6 each Topical Daily   diltiazem   30 mg Oral Q6H   multivitamin with minerals  1  tablet Oral Daily   QUEtiapine   25 mg Oral QHS   tamsulosin   0.4 mg Oral Daily   Continuous Infusions:   PRN Meds:.diltiazem , docusate sodium , oxyCODONE , polyethylene glycol, prochlorperazine , traMADol   Antibiotics  :    Anti-infectives (From admission, onward)    None         Objective:   Vitals:   02/16/24 0100 02/16/24 0400 02/16/24 0600 02/16/24 0753  BP:    123/77  Pulse: (!) 103   88  Resp:    (!) 23  Temp:  98.6 F (37 C)  98.6 F (37 C)  TempSrc:  Oral  Oral  SpO2: 91%  98% 98%  Weight:      Height:        Wt Readings from Last 3 Encounters:  02/12/24 69 kg  01/25/24 68.9 kg  11/18/23 68 kg     Intake/Output Summary (Last 24 hours) at 02/16/2024 1005 Last data filed at 02/16/2024 0600 Gross per 24 hour  Intake 480 ml  Output 710 ml  Net -230 ml     Physical Exam  Awake - but confused, No new F.N deficits, Normal affect [place].AT,PERRAL Supple Neck, No JVD,   Symmetrical Chest wall movement, Good air movement bilaterally, CTAB RRR,No Gallops,Rubs or new Murmurs,  +ve B.Sounds, Abd Soft, No tenderness,   No Cyanosis, Clubbing or edema        Data Review:    Recent Labs  Lab 02/13/24 0213 02/13/24 1832 02/14/24 9350 02/15/24 9471  02/16/24 0512  WBC 10.3 7.7 11.3* 8.9 8.5  HGB 6.3* 8.3* 8.6* 7.9* 8.1*  HCT 21.6* 27.1* 27.4* 25.7* 26.7*  PLT 183 160 145* 136* 143*  MCV 82.4 83.6 83.5 83.7 85.0  MCH 24.0* 25.6* 26.2 25.7* 25.8*  MCHC 29.2* 30.6 31.4 30.7 30.3  RDW 17.7* 17.0* 17.8* 18.4* 18.9*  LYMPHSABS 0.5*  --  0.5* 0.7 0.4*  MONOABS 0.6  --  0.7 0.5 0.5  EOSABS 0.0  --  0.1 0.2 0.2  BASOSABS 0.0  --  0.0 0.0 0.0    Recent Labs  Lab 02/13/24 0213 02/14/24 0649 02/15/24 0527 02/15/24 0528 02/16/24 0512  NA 140 140  --  143 140  K 3.7 4.4  --  3.8 4.2  CL 108 108  --  112* 112*  CO2 22 21*  --  25 21*  ANIONGAP 10 11  --  6 7  GLUCOSE 112* 103*  --  101* 111*  BUN 20 19  --  18 20  CREATININE 1.10 1.01  --  0.98 0.92   AST 39  --   --   --   --   ALT 29  --   --   --   --   ALKPHOS 46  --   --   --   --   BILITOT 0.8  --   --   --   --   ALBUMIN 3.0*  --   --   --   --   TSH  --   --  0.849  --   --   MG  --  2.1  --  2.2 2.1  CALCIUM  8.0* 8.0*  --  8.1* 7.9*      Recent Labs  Lab 02/13/24 0213 02/14/24 0649 02/15/24 0527 02/15/24 0528 02/16/24 0512  TSH  --   --  0.849  --   --   MG  --  2.1  --  2.2 2.1  CALCIUM  8.0* 8.0*  --  8.1* 7.9*    --------------------------------------------------------------------------------------------------------------- Lab Results  Component Value Date   CHOL 115 10/17/2021   HDL 47 10/17/2021   LDLCALC 57 10/17/2021   TRIG 47 10/17/2021   CHOLHDL 2.4 10/17/2021    Lab Results  Component Value Date   HGBA1C 5.8 (H) 03/25/2023   Recent Labs    02/15/24 0527  TSH 0.849  FREET4 1.08   No results for input(s): VITAMINB12, FOLATE, FERRITIN, TIBC, IRON, RETICCTPCT in the last 72 hours.     Radiology Report DG Pelvis Comp Min 3V Result Date: 02/14/2024 CLINICAL DATA:  Close pelvic fracture. EXAM: JUDET PELVIS - 3+ VIEW COMPARISON:  CT chest, abdomen, and pelvis dated 02/13/2024. FINDINGS: Displaced fractures of the right superior pubic ramus and the left superior and inferior pubic rami. Right sacral ala fracture is better visualized on the prior CT. Bilateral total hip arthroplasties remain aligned. Degenerative changes of the visualized lower lumbar spine. Diffuse osseous demineralization. IMPRESSION: Displaced fractures of the left superior and inferior pubic rami in the right superior pubic ramus. Right sacral ala fracture is better visualized on the prior CT. Electronically Signed   By: Harrietta Sherry M.D.   On: 02/14/2024 12:19     Signature  -   Lavada Stank M.D on 02/16/2024 at 10:05 AM   -  To page go to [email or web address]

## 2024-02-16 NOTE — Progress Notes (Signed)
 Cardiologist:  Turner/Lambert  Subjective:  Denies SSCP, palpitations or Dyspnea Soreness when weight bearing   Objective:  Vitals:   02/16/24 0100 02/16/24 0400 02/16/24 0600 02/16/24 0753  BP:    123/77  Pulse: (!) 103   88  Resp:    (!) 23  Temp:  98.6 F (37 C)  98.6 F (37 C)  TempSrc:  Oral  Oral  SpO2: 91%  98% 98%  Weight:      Height:        Intake/Output from previous day:  Intake/Output Summary (Last 24 hours) at 02/16/2024 1014 Last data filed at 02/16/2024 0600 Gross per 24 hour  Intake 480 ml  Output 710 ml  Net -230 ml    Physical Exam: Elderly male Lungs clear Mild AS murmur Prior bilateral THR Plus one dry edema LE;s with venous stasis changes   Lab Results: Basic Metabolic Panel: Recent Labs    02/15/24 0528 02/16/24 0512  NA 143 140  K 3.8 4.2  CL 112* 112*  CO2 25 21*  GLUCOSE 101* 111*  BUN 18 20  CREATININE 0.98 0.92  CALCIUM  8.1* 7.9*  MG 2.2 2.1   Liver Function Tests: No results for input(s): AST, ALT, ALKPHOS, BILITOT, PROT, ALBUMIN in the last 72 hours. No results for input(s): LIPASE, AMYLASE in the last 72 hours. CBC: Recent Labs    02/15/24 0528 02/16/24 0512  WBC 8.9 8.5  NEUTROABS 7.4 7.3  HGB 7.9* 8.1*  HCT 25.7* 26.7*  MCV 83.7 85.0  PLT 136* 143*    Thyroid  Function Tests: Recent Labs    02/15/24 0527  TSH 0.849   Anemia Panel: No results for input(s): VITAMINB12, FOLATE, FERRITIN, TIBC, IRON, RETICCTPCT in the last 72 hours.  Imaging: DG Pelvis Comp Min 3V Result Date: 02/14/2024 CLINICAL DATA:  Close pelvic fracture. EXAM: JUDET PELVIS - 3+ VIEW COMPARISON:  CT chest, abdomen, and pelvis dated 02/13/2024. FINDINGS: Displaced fractures of the right superior pubic ramus and the left superior and inferior pubic rami. Right sacral ala fracture is better visualized on the prior CT. Bilateral total hip arthroplasties remain aligned. Degenerative changes of the visualized lower  lumbar spine. Diffuse osseous demineralization. IMPRESSION: Displaced fractures of the left superior and inferior pubic rami in the right superior pubic ramus. Right sacral ala fracture is better visualized on the prior CT. Electronically Signed   By: Harrietta Sherry M.D.   On: 02/14/2024 12:19    Cardiac Studies:  ECG: afib no acute changes   Telemetry: afib rate imroved 80-95 bpm  Echo:   Medications:    acetaminophen   1,000 mg Oral Q8H   amiodarone   200 mg Oral Daily   atorvastatin   20 mg Oral Daily   carvedilol   6.25 mg Oral BID   Chlorhexidine  Gluconate Cloth  6 each Topical Daily   diltiazem   30 mg Oral Q6H   multivitamin with minerals  1 tablet Oral Daily   QUEtiapine   25 mg Oral QHS   tamsulosin   0.4 mg Oral Daily      Assessment/Plan:  Afib:  PAF but mostly afib with SSS. Sensitive to beta blockers Start oral cardizem  30 mg q 8 hours. Hold eliquis  for extra peritoneal bleed. Turned down by Dr Cindie for Watchman due to age and LE venous ulcers He is getting coreg  6.25 bid will need to watch for post conversion pauses as he has known SSS and beta blocker stopped in past CAD: distant stent RCA 2007 low risk  myovue 2022 no angina observe  Rates improved on combination of amiodarone , coreg  and cardizem   [name] 02/16/2024, 10:14 AM

## 2024-02-16 NOTE — TOC Progression Note (Addendum)
 Transition of Care [facility]) - Progression Note    Patient Details  Name: [name] MRN: [id number] Date of Birth: 13-Jan-1941  [facility]) CM/SW Contact  Inocente GORMAN Kindle, LCSW Phone Number: 02/16/2024, 9:04 AM  Clinical Narrative:    9:05 AM-CSW contacted [facility] and notified Delon that [facility] has not called CSW back about the referral. Delon requested CSW send referral to her and she can review at [email or web address].   10:29 AM-CSW received call from [facility] with [facility] [phone]). She reported they will have a bed for patient on Friday. CSW will begin insurance process. Therapy to see patient today. Daughters updated.   3:24 PM-Insurance authorization pending, Ref# [id number].   Expected Discharge Plan: Skilled Nursing Facility Barriers to Discharge: Continued Medical Work up, English as a second language teacher, SNF Pending bed offer               Expected Discharge Plan and Services In-house Referral: Clinical Social Work   Post Acute Care Choice: Skilled Nursing Facility Living arrangements for the past 2 months: Single Family Home                                       Social Drivers of Health (SDOH) Interventions SDOH Screenings   Food Insecurity: No Food Insecurity (02/13/2024)  Housing: Low Risk  (02/13/2024)  Transportation Needs: No Transportation Needs (02/13/2024)  Utilities: Not At Risk (02/13/2024)  Depression (PHQ2-9): Low Risk  (09/01/2023)  Social Connections: Moderately Integrated (02/13/2024)  Tobacco Use: Low Risk  (01/25/2024)    Readmission Risk Interventions     No data to display

## 2024-02-16 NOTE — Plan of Care (Signed)

## 2024-02-16 NOTE — Plan of Care (Signed)
 Pt has rested quietly throughout the night with no distress noted. Alert and oriented to person. On O2 2LNC. SR on the monitor. Foley cath intact to BSD. Medicated for pain once with relief noted. Pt turned. Colace ordered and given per family request. No other complaints voiced.      Problem: Education: Goal: Knowledge of General Education information will improve Description: Including pain rating scale, medication(s)/side effects and non-pharmacologic comfort measures Outcome: Progressing   Problem: Clinical Measurements: Goal: Respiratory complications will improve Outcome: Progressing Goal: Cardiovascular complication will be avoided Outcome: Progressing   Problem: Pain Managment: Goal: General experience of comfort will improve and/or be controlled Outcome: Progressing

## 2024-02-16 NOTE — Progress Notes (Signed)
 Physical Therapy Treatment Patient Details Name: [name] MRN: [id number] DOB: September 28, 1940 Today's Date: 02/16/2024   History of Present Illness Pt is an 83 y/o M admitted on 02/12/24 after presenting following a fall. Pt found to have bilateral pubic rami fx & adjacent hematoma, as well as symptomatic anemia. PMH: CAD, HFpEF, PAF, HTN, R THA    PT Comments  Pt with some regression towards his physical therapy goals this session; more limited by pain, despite premedication. Discussed with MD regarding pain medication regimen. Initiated session with bed level therapeutic exercises for LE's. Pt maneuvering to edge of bed with increased time/effort. Unable to stand due to complaints of pain rather than lack of strength. Returned to supine with +2 assist. Will continue efforts. Patient will benefit from continued inpatient follow up therapy, <3 hours/day.    If plan is discharge home, recommend the following: Two people to help with walking and/or transfers;Two people to help with bathing/dressing/bathroom   Can travel by private vehicle     No  Equipment Recommendations  BSC/3in1;Wheelchair (measurements PT);Wheelchair cushion (measurements PT);Hospital bed;Rolling walker (2 wheels)    Recommendations for Other Services       Precautions / Restrictions Precautions Precautions: Fall Recall of Precautions/Restrictions: Intact Restrictions Weight Bearing Restrictions Per Provider Order: Yes RLE Weight Bearing Per Provider Order: Weight bearing as tolerated LLE Weight Bearing Per Provider Order: Weight bearing as tolerated     Mobility  Bed Mobility Overal bed mobility: Needs Assistance Bed Mobility: Supine to Sit, Sit to Supine     Supine to sit: Max assist Sit to supine: Max assist, +2 for physical assistance   General bed mobility comments: Moving BLE's incrementally to EOB with assist and bed pad tucked underneath to facilitate glide. Heavy trunk assist to sit upright. Pt  leaning back to scoot hips forward to edge of bed. +2 to return to supine and scoot up in bed    Transfers                   General transfer comment: pt declines    Ambulation/Gait                   Stairs             Wheelchair Mobility     Tilt Bed    Modified Rankin (Stroke Patients Only)       Balance Overall balance assessment: Needs assistance Sitting-balance support: Feet supported Sitting balance-Leahy Scale: Fair                                      Hotel manager: No apparent difficulties  Cognition Arousal: Alert Behavior During Therapy: WFL for tasks assessed/performed   PT - Cognitive impairments: Memory, Initiation, Sequencing, Problem solving, Awareness                         Following commands: Impaired Following commands impaired: Follows one step commands with increased time    Cueing Cueing Techniques: Verbal cues, Gestural cues  Exercises General Exercises - Lower Extremity Ankle Circles/Pumps: AROM, Both, 10 reps, Supine Quad Sets: AROM, Both, 10 reps, Supine Heel Slides: AROM, Both, 5 reps, Supine (limited)    General Comments        Pertinent Vitals/Pain Pain Assessment Pain Assessment: Faces Faces Pain Scale: Hurts worst Pain Location: L groin/hip, bilateral knees, back  Pain Descriptors / Indicators: Discomfort, Grimacing, Guarding, Sharp Pain Intervention(s): Limited activity within patient's tolerance, Monitored during session, Premedicated before session    Home Living                          Prior Function            PT Goals (current goals can now be found in the care plan section) Acute Rehab PT Goals Patient Stated Goal: decreased pain, get better Potential to Achieve Goals: Fair Progress towards PT goals: Not progressing toward goals - comment    Frequency    Min 3X/week      PT Plan      Co-evaluation               AM-PAC PT 6 Clicks Mobility   Outcome Measure  Help needed turning from your back to your side while in a flat bed without using bedrails?: A Lot Help needed moving from lying on your back to sitting on the side of a flat bed without using bedrails?: Total Help needed moving to and from a bed to a chair (including a wheelchair)?: Total Help needed standing up from a chair using your arms (e.g., wheelchair or bedside chair)?: Total Help needed to walk in hospital room?: Total Help needed climbing 3-5 steps with a railing? : Total 6 Click Score: 7    End of Session Equipment Utilized During Treatment: Gait belt Activity Tolerance: Patient tolerated treatment well Patient left: in bed;with call bell/phone within reach;with bed alarm set;with family/visitor present Nurse Communication: Mobility status PT Visit Diagnosis: Pain;Unsteadiness on feet (R26.81);Difficulty in walking, not elsewhere classified (R26.2);Other abnormalities of gait and mobility (R26.89);Muscle weakness (generalized) (M62.81) Pain - Right/Left: Left Pain - part of body: Hip     Time: 1105-1140 PT Time Calculation (min) (ACUTE ONLY): 35 min  Charges:    $Therapeutic Activity: 23-37 mins PT General Charges $$ ACUTE PT VISIT: 1 Visit                     Aleck Daring, PT, DPT Acute Rehabilitation Services Office [phone]    Alayne ONEIDA Daring 02/16/2024, 11:52 AM

## 2024-02-17 DIAGNOSIS — Z7901 Long term (current) use of anticoagulants: Secondary | ICD-10-CM

## 2024-02-17 DIAGNOSIS — Z5181 Encounter for therapeutic drug level monitoring: Secondary | ICD-10-CM | POA: Diagnosis not present

## 2024-02-17 DIAGNOSIS — I480 Paroxysmal atrial fibrillation: Secondary | ICD-10-CM | POA: Diagnosis not present

## 2024-02-17 LAB — CBC WITH DIFFERENTIAL/PLATELET
Abs Immature Granulocytes: 0.03 K/uL (ref 0.00–0.07)
Basophils Absolute: 0 K/uL (ref 0.0–0.1)
Basophils Relative: 0 %
Eosinophils Absolute: 0.1 K/uL (ref 0.0–0.5)
Eosinophils Relative: 1 %
HCT: 27.1 % — ABNORMAL LOW (ref 39.0–52.0)
Hemoglobin: 8.4 g/dL — ABNORMAL LOW (ref 13.0–17.0)
Immature Granulocytes: 0 %
Lymphocytes Relative: 8 %
Lymphs Abs: 0.6 K/uL — ABNORMAL LOW (ref 0.7–4.0)
MCH: 25.9 pg — ABNORMAL LOW (ref 26.0–34.0)
MCHC: 31 g/dL (ref 30.0–36.0)
MCV: 83.6 fL (ref 80.0–100.0)
Monocytes Absolute: 0.5 K/uL (ref 0.1–1.0)
Monocytes Relative: 7 %
Neutro Abs: 6.6 K/uL (ref 1.7–7.7)
Neutrophils Relative %: 84 %
Platelets: 170 K/uL (ref 150–400)
RBC: 3.24 MIL/uL — ABNORMAL LOW (ref 4.22–5.81)
RDW: 18.8 % — ABNORMAL HIGH (ref 11.5–15.5)
WBC: 7.9 K/uL (ref 4.0–10.5)
nRBC: 0.4 % — ABNORMAL HIGH (ref 0.0–0.2)

## 2024-02-17 LAB — BASIC METABOLIC PANEL WITH GFR
Anion gap: 7 (ref 5–15)
BUN: 20 mg/dL (ref 8–23)
CO2: 25 mmol/L (ref 22–32)
Calcium: 8.1 mg/dL — ABNORMAL LOW (ref 8.9–10.3)
Chloride: 111 mmol/L (ref 98–111)
Creatinine, Ser: 1.12 mg/dL (ref 0.61–1.24)
GFR, Estimated: >60 mL/min (ref >60)
Glucose, Bld: 140 mg/dL — ABNORMAL HIGH (ref 70–99)
Potassium: 3.6 mmol/L (ref 3.5–5.1)
Sodium: 143 mmol/L (ref 135–145)

## 2024-02-17 LAB — MAGNESIUM: Magnesium: 2 mg/dL (ref 1.7–2.4)

## 2024-02-17 MED ORDER — ENSURE PLUS HIGH PROTEIN PO LIQD
237.0000 mL | Freq: Two times a day (BID) | ORAL | Status: DC
  Administered 2024-02-17 – 2024-02-20 (×8): 237 mL via ORAL

## 2024-02-17 MED ORDER — DILTIAZEM HCL ER COATED BEADS 120 MG PO CP24
240.0000 mg | ORAL_CAPSULE | Freq: Every day | ORAL | Status: DC
  Administered 2024-02-17 – 2024-02-20 (×4): 240 mg via ORAL
  Filled 2024-02-17 (×5): qty 2

## 2024-02-17 MED ORDER — POTASSIUM CHLORIDE CRYS ER 20 MEQ PO TBCR
40.0000 meq | EXTENDED_RELEASE_TABLET | Freq: Once | ORAL | Status: AC
  Administered 2024-02-17: 40 meq via ORAL
  Filled 2024-02-17: qty 2

## 2024-02-17 MED ORDER — BACLOFEN 10 MG PO TABS
10.0000 mg | ORAL_TABLET | Freq: Three times a day (TID) | ORAL | Status: DC | PRN
  Administered 2024-02-17 – 2024-02-20 (×3): 10 mg via ORAL
  Filled 2024-02-17 (×3): qty 1

## 2024-02-17 NOTE — Plan of Care (Signed)

## 2024-02-17 NOTE — Plan of Care (Addendum)
 Pt has rested quietly part of the night with no distress noted. Was awake frequently trying to get OOB. Tele sitter at bedside. Pt is oriented to self only tonight. Thought he was at home but not in his bedroom. Attempted to reorient frequently but pt became argumentative. This am pt is calm and resting. On O22LNC. A-fib on the monitor. Foley cath intact to BSD, amber urine. Medicated once with oxy for pain with relief noted. Pt hurts very much when moved at all. No other complaints voiced.     Problem: Clinical Measurements: Goal: Diagnostic test results will improve Outcome: Progressing Goal: Respiratory complications will improve Outcome: Progressing Goal: Cardiovascular complication will be avoided Outcome: Progressing   Problem: Activity: Goal: Risk for activity intolerance will decrease Outcome: Progressing   Problem: Pain Managment: Goal: General experience of comfort will improve and/or be controlled Outcome: Progressing

## 2024-02-17 NOTE — Progress Notes (Signed)
 Physical Therapy Treatment Patient Details Name: [name] MRN: [id number] DOB: 03/20/1941 Today's Date: 02/17/2024   History of Present Illness Pt is an 83 y/o M admitted on 02/12/24 after presenting following a fall. Pt found to have bilateral pubic rami fx & adjacent hematoma, as well as symptomatic anemia. PMH: CAD, HFpEF, PAF, HTN, R THA    PT Comments  Pt tolerated treatment well today. Co-treat with OT. Pt today was able to transfer to chair with +2 Min/Mod A. No change in DC/DME recs at this time. PT will continue to follow.     If plan is discharge home, recommend the following: Two people to help with walking and/or transfers;Two people to help with bathing/dressing/bathroom   Can travel by private vehicle     No  Equipment Recommendations  BSC/3in1;Wheelchair (measurements PT);Wheelchair cushion (measurements PT);Hospital bed;Rolling walker (2 wheels)    Recommendations for Other Services       Precautions / Restrictions Precautions Precautions: Fall Recall of Precautions/Restrictions: Intact Restrictions Weight Bearing Restrictions Per Provider Order: Yes RLE Weight Bearing Per Provider Order: Weight bearing as tolerated LLE Weight Bearing Per Provider Order: Weight bearing as tolerated     Mobility  Bed Mobility Overal bed mobility: Needs Assistance Bed Mobility: Supine to Sit     Supine to sit: Max assist     General bed mobility comments: patient assisting with moving BLEs off EOB and required max assist with trunk and increased time with patient asking to stop periodically    Transfers Overall transfer level: Needs assistance Equipment used: Rolling walker (2 wheels) Transfers: Sit to/from Stand, Bed to chair/wheelchair/BSC Sit to Stand: Mod assist, +2 physical assistance, Via lift equipment   Step pivot transfers: Min assist, +2 physical assistance       General transfer comment: mod assist +2 to stand from raised bed and min assisst for  safety to transfer to recliner with cues for hand placement    Ambulation/Gait                   Stairs             Wheelchair Mobility     Tilt Bed    Modified Rankin (Stroke Patients Only)       Balance Overall balance assessment: Needs assistance Sitting-balance support: Feet supported Sitting balance-Leahy Scale: Fair Sitting balance - Comments: EOB   Standing balance support: Reliant on assistive device for balance Standing balance-Leahy Scale: Poor Standing balance comment: Heavy reliance for UE support                            Communication Communication Communication: No apparent difficulties  Cognition Arousal: Alert Behavior During Therapy: WFL for tasks assessed/performed                             Following commands: Impaired Following commands impaired: Follows one step commands with increased time    Cueing Cueing Techniques: Verbal cues, Gestural cues  Exercises General Exercises - Upper Extremity Shoulder Horizontal ABduction: Strengthening, Both, 15 reps, Seated, Theraband Theraband Level (Shoulder Horizontal Abduction): Level 1 (Yellow) Elbow Flexion: Strengthening, Both, 15 reps, Seated, Theraband Theraband Level (Elbow Flexion): Level 1 (Yellow) Elbow Extension: Strengthening, Both, 15 reps, Seated, Theraband Theraband Level (Elbow Extension): Level 1 (Yellow) General Exercises - Lower Extremity Ankle Circles/Pumps: AROM, Both, 10 reps, Seated Short Arc Quad: AROM, Strengthening, 10 reps, Seated,  Both    General Comments General comments (skin integrity, edema, etc.): VSS on 2L      Pertinent Vitals/Pain Pain Assessment Pain Assessment: Faces Faces Pain Scale: Hurts whole lot Pain Location: L groin/hip, bilateral knees, back Pain Descriptors / Indicators: Discomfort, Grimacing, Guarding, Sharp    Home Living                          Prior Function            PT Goals (current  goals can now be found in the care plan section) Progress towards PT goals: Progressing toward goals    Frequency    Min 3X/week      PT Plan      Co-evaluation PT/OT/SLP Co-Evaluation/Treatment: Yes Reason for Co-Treatment: For patient/therapist safety;To address functional/ADL transfers PT goals addressed during session: Mobility/safety with mobility OT goals addressed during session: ADL's and self-care;Strengthening/ROM      AM-PAC PT 6 Clicks Mobility   Outcome Measure  Help needed turning from your back to your side while in a flat bed without using bedrails?: A Lot Help needed moving from lying on your back to sitting on the side of a flat bed without using bedrails?: Total Help needed moving to and from a bed to a chair (including a wheelchair)?: Total Help needed standing up from a chair using your arms (e.g., wheelchair or bedside chair)?: Total Help needed to walk in hospital room?: Total Help needed climbing 3-5 steps with a railing? : Total 6 Click Score: 7    End of Session Equipment Utilized During Treatment: Gait belt;Oxygen Activity Tolerance: Patient tolerated treatment well Patient left: with call bell/phone within reach;in chair;with chair alarm set Nurse Communication: Mobility status PT Visit Diagnosis: Pain;Unsteadiness on feet (R26.81);Difficulty in walking, not elsewhere classified (R26.2);Other abnormalities of gait and mobility (R26.89);Muscle weakness (generalized) (M62.81) Pain - Right/Left: Left Pain - part of body: Hip     Time: 8991-8966 PT Time Calculation (min) (ACUTE ONLY): 25 min  Charges:    $Therapeutic Activity: 8-22 mins PT General Charges $$ ACUTE PT VISIT: 1 Visit                     [name], PT, DPT Acute Rehab Services [phone]    [name] 02/17/2024, 3:39 PM

## 2024-02-17 NOTE — Progress Notes (Signed)
  Progress Note  Patient Name: [name] Date of Encounter: 02/17/2024 [facility] Cardiologist: Wilbert Bihari, MD   Interval Summary   Denies any symptoms Resting this morning   Vital Signs Vitals:   02/17/24 0500 02/17/24 0600 02/17/24 0625 02/17/24 0750  BP:    128/81  Pulse:    95  Resp: 18 20 20  (!) 25  Temp:    98.1 F (36.7 C)  TempSrc:    Oral  SpO2:    94%  Weight:      Height:        Intake/Output Summary (Last 24 hours) at 02/17/2024 0834 Last data filed at 02/17/2024 0600 Gross per 24 hour  Intake 480 ml  Output 2125 ml  Net -1645 ml      02/17/2024    4:45 AM 02/12/2024   11:29 PM 01/25/2024   10:07 AM  Last 3 Weights  Weight (lbs) 175 lb 7.8 oz 152 lb 1.9 oz 152 lb  Weight (kg) 79.6 kg 69 kg 68.947 kg     Telemetry/ECG  Atrial fibrillation, HR currently 90s, seems to have some episodes overnight with rates in 130-140s - Personally Reviewed  Physical Exam  GEN: No acute distress, resting.   Neck: No JVD Cardiac: iRRR.  Respiratory: Clear to auscultation bilaterally. GI: Soft, nontender, non-distended  MS: mild LE edema   Assessment & Plan   Persistent atrial fibrillation  Sick sinus syndrome  Not candidate for Watchman per Dr. Cindie  Continue amiodarone  200 mg daily Continue Coreg  6.25 mg BID  Continue diltiazem  30 mg Q6 Holding Eliquis  in the setting of ABLA  Chronic HFpEF Echo Dec. 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 2022 showed no changes, low risk study No current anginal symptoms  Per primary Mechanical fall Pubic rami fracture Hematoma Acute blood loss anemia Symptomatic anemia Urinary retention Hyperlipidemia Cognitive decline    For questions or updates, please contact [facility] Please consult  [email or web address] for contact info under       Signed, Waddell DELENA Donath, PA-C

## 2024-02-17 NOTE — TOC Progression Note (Addendum)
 Transition of Care [facility]) - Progression Note    Patient Details  Name: [name] MRN: [id number] Date of Birth: [birth year]  Transition of Care [facility]) CM/SW Contact  Inocente GORMAN Kindle, [place] Phone Number: 02/17/2024, 4:20 PM  Clinical Narrative:    Insurance approval received for [facility], Ref# [id number], Auth ID# [id number], effective 02/18/2024-02/22/2024.  CSW updated Powell in admissions at [facility] and she stated they had to move residents unexpectedly and now cannot accept patient until Sunday. CSW updated MD and patient's daughter, [name].    Expected Discharge Plan: Skilled Nursing Facility Barriers to Discharge: SNF Pending bed offer, Continued Medical Work up               Expected Discharge Plan and Services In-house Referral: Clinical Social Work   Post Acute Care Choice: Skilled Nursing Facility Living arrangements for the past 2 months: Single Family Home                                       Social Drivers of Health (SDOH) Interventions SDOH Screenings   Food Insecurity: No Food Insecurity (02/13/2024)  Housing: Low Risk  (02/13/2024)  Transportation Needs: No Transportation Needs (02/13/2024)  Utilities: Not At Risk (02/13/2024)  Depression (PHQ2-9): Low Risk  (09/01/2023)  Social Connections: Moderately Integrated (02/13/2024)  Tobacco Use: Low Risk  (01/25/2024)    Readmission Risk Interventions     No data to display

## 2024-02-17 NOTE — Care Management Important Message (Signed)
 Important Message  Patient Details  Name: [name] MRN: [id number] Date of Birth: 01-Mar-1941   Important Message Given:  Yes - Medicare IM     Claretta Deed 02/17/2024, 10:25 AM

## 2024-02-17 NOTE — Progress Notes (Signed)
 Occupational Therapy Treatment Patient Details Name: [name] MRN: [id number] DOB: 1941/02/18 Today's Date: 02/17/2024   History of present illness Pt is an 83 y/o M admitted on 02/12/24 after presenting following a fall. Pt found to have bilateral pubic rami fx & adjacent hematoma, as well as symptomatic anemia. PMH: CAD, HFpEF, PAF, HTN, R THA   OT comments  Patient seen with PT to address bed mobility and transfers. Patient received in supine and agreeable to treatment. Patient requiring max assist and increased time for supine to sitting on EOB with patient able to move BLEs off EOB but requiring assistance with trunk using bed pad and patient asking to stop occasionally due to pain. Patient was instructed on sit to stand and mod assist +2 to stand from raised bed and min assist +2 for transfers. Patient performed grooming and BUE strengthening seated in recliner. Patient stated comfort at end of session. Patient will benefit from continued inpatient follow up therapy, <3 hours/day.  Acute OT to continue to follow to address established goals to facilitate DC to next venue of care.        If plan is discharge home, recommend the following:  Assist for transportation;Assistance with cooking/housework;A lot of help with walking and/or transfers;A lot of help with bathing/dressing/bathroom   Equipment Recommendations  None recommended by OT    Recommendations for Other Services      Precautions / Restrictions Precautions Precautions: Fall Recall of Precautions/Restrictions: Intact Restrictions Weight Bearing Restrictions Per Provider Order: Yes RLE Weight Bearing Per Provider Order: Weight bearing as tolerated LLE Weight Bearing Per Provider Order: Weight bearing as tolerated       Mobility Bed Mobility Overal bed mobility: Needs Assistance Bed Mobility: Supine to Sit     Supine to sit: Max assist     General bed mobility comments: patient assisting with moving BLEs  off EOB and required max assist with trunk and increased time with patient asking to stop periodically    Transfers Overall transfer level: Needs assistance Equipment used: Rolling walker (2 wheels) Transfers: Sit to/from Stand, Bed to chair/wheelchair/BSC Sit to Stand: Mod assist, +2 physical assistance, Via lift equipment     Step pivot transfers: Min assist, +2 physical assistance     General transfer comment: mod assist +2 to stand from raised bed and min assisst for safety to transfer to recliner with cues for hand placement     Balance Overall balance assessment: Needs assistance Sitting-balance support: Feet supported Sitting balance-Leahy Scale: Fair Sitting balance - Comments: EOB   Standing balance support: Reliant on assistive device for balance Standing balance-Leahy Scale: Poor Standing balance comment: Heavy reliance for UE support                           ADL either performed or assessed with clinical judgement   ADL Overall ADL's : Needs assistance/impaired     Grooming: Wash/dry hands;Wash/dry face;Set up;Sitting               Lower Body Dressing: Maximal assistance;Bed level Lower Body Dressing Details (indicate cue type and reason): donned socks                    Extremity/Trunk Assessment              Vision       Perception     Praxis     Communication Communication Communication: No apparent difficulties  Cognition Arousal: Alert Behavior During Therapy: WFL for tasks assessed/performed Cognition: Cognition impaired       Memory impairment (select all impairments): Short-term memory, Working memory Attention impairment (select first level of impairment): Sustained attention Executive functioning impairment (select all impairments): Initiation, Reasoning, Problem solving                   Following commands: Impaired Following commands impaired: Follows one step commands with increased time       Cueing   Cueing Techniques: Verbal cues, Gestural cues  Exercises Exercises: General Upper Extremity General Exercises - Upper Extremity Shoulder Horizontal ABduction: Strengthening, Both, 15 reps, Seated, Theraband Theraband Level (Shoulder Horizontal Abduction): Level 1 (Yellow) Elbow Flexion: Strengthening, Both, 15 reps, Seated, Theraband Theraband Level (Elbow Flexion): Level 1 (Yellow) Elbow Extension: Strengthening, Both, 15 reps, Seated, Theraband Theraband Level (Elbow Extension): Level 1 (Yellow)    Shoulder Instructions       General Comments 2 liters O2    Pertinent Vitals/ Pain       Pain Assessment Pain Assessment: Faces Faces Pain Scale: Hurts whole lot Pain Location: L groin/hip, bilateral knees, back Pain Descriptors / Indicators: Discomfort, Grimacing, Guarding, Sharp Pain Intervention(s): Limited activity within patient's tolerance, Monitored during session, Premedicated before session, Repositioned  Home Living                                          Prior Functioning/Environment              Frequency  Min 2X/week        Progress Toward Goals  OT Goals(current goals can now be found in the care plan section)  Progress towards OT goals: Progressing toward goals  Acute Rehab OT Goals Patient Stated Goal: to get stronger OT Goal Formulation: With patient Time For Goal Achievement: 02/28/24 Potential to Achieve Goals: Good ADL Goals Pt Will Perform Grooming: with supervision;standing Pt Will Perform Lower Body Bathing: with min assist;sit to/from stand;sitting/lateral leans Pt Will Perform Lower Body Dressing: with min assist;sitting/lateral leans;sit to/from stand Pt Will Transfer to Toilet: with supervision;ambulating;regular height toilet  Plan      Co-evaluation    PT/OT/SLP Co-Evaluation/Treatment: Yes Reason for Co-Treatment: For patient/therapist safety;To address functional/ADL transfers   OT goals  addressed during session: ADL's and self-care;Strengthening/ROM      AM-PAC OT 6 Clicks Daily Activity     Outcome Measure   Help from another person eating meals?: A Little Help from another person taking care of personal grooming?: A Little Help from another person toileting, which includes using toliet, bedpan, or urinal?: A Lot Help from another person bathing (including washing, rinsing, drying)?: A Lot Help from another person to put on and taking off regular upper body clothing?: A Little Help from another person to put on and taking off regular lower body clothing?: A Lot 6 Click Score: 15    End of Session Equipment Utilized During Treatment: Gait belt;Rolling walker (2 wheels)  OT Visit Diagnosis: Unsteadiness on feet (R26.81);Pain Pain - Right/Left: Left Pain - part of body: Hip   Activity Tolerance Patient tolerated treatment well   Patient Left in chair;with call bell/phone within reach;with chair alarm set   Nurse Communication Mobility status        Time: 8991-8966 OT Time Calculation (min): 25 min  Charges: OT General Charges $OT Visit: 1 Visit OT Treatments $Self  Care/Home Management : 8-22 mins  Dick Reyes, OTA Acute Rehabilitation Services  Office [phone]   [name] 02/17/2024, 12:57 PM

## 2024-02-17 NOTE — Progress Notes (Signed)
 PROGRESS NOTE                                                                                                                                                                                                             Patient Demographics:    [name], is a 83 y.o. male, DOB - 1941/03/20, FMW:[id number]  Outpatient Primary MD for the patient is Rexanne Ingle, MD    LOS - 4  Admit date - 02/12/2024    Chief Complaint  Patient presents with   Fall       Brief Narrative (HPI from H&P)   83 y.o. male with medical history significant of CAD, HFpEF, PAF, and HTN p/w GLF c/b b/l pubic rami fractures and found to have symptomatic anemia.   Pt was in his USOH until yesterday evening around 1700 when he fell. Pt reports bending over and reaching for an item in the lower cabinet, when he lost balance, and fell backwards onto his butt (no LOC or head strike). He was able to get himself off the ground before calling EMS. Pt denied any lightheadedness or dizziness prior to his fall. Of note, pt reports taking OAC for known Afib.   In the ED, pt tachycardic and hypertensive. Labs notable for Hb 6.3, Fe 34(low), TIBC 371 (nl), % sat 9 (low), and ferritin 13 (low). FOBT neg. CTH w/ NAICA. CT chest/abd/pelvis showed acute fractures of the right superior, left superior and inferior pubic rami with mild displacement suggested a lateral compression type injury, and adjacent extraperitoneal hemorrhage. EDP consulted Trauma/Ortho sx who recommended PT/OT evaluation, blood transfusion, and medicine admission.   Subjective:   Patient in bed, appears comfortable, denies any headache, no fever, no chest pain or pressure, no shortness of breath , no abdominal pain. No focal weakness. Mild back pain.   Assessment  & Plan :    Mechanical fall due to loss of balance causing B/l pubic rami fracture c/b adjacent hematoma and acute blood loss  anemia  -Trauma and Ortho consulted; it is post 2 units of packed RBC transfusion on 02/13/2024, CBC currently stable, for now continue to hold Eliquis , supportive care, PT OT, Ortho and trauma following.  Will require SNF.    Symptomatic anemia  - As above   Chronic urinary retention - Pt chronically self caths himself Q6 at  home,  Foley placed 7/27, add Flomax  and monitor.   PAF [place] vas 2 score of greater than 3, had some RVR - HOLD pta apixaban  for now, continued on PTA amiodarone  200mg  daily and Coreg  6.25mg  BID, seen by cardiology, now also on Cardizem , dose adjusted 02/17/2024, case discussed with cardiology Eliquis  and day 8 from injury.      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  Gets delirious at home during nighttime, delirium likely will get worse in the hospital due to metabolic encephalopathy, nighttime Seroquel  and monitor.  Family updated in detail.      Condition - Extremely Guarded  Family Communication  : daughter Karyn [phone]  on 02/14/24, 02/15/2024, daughter [name] on 02/16/2024 bedside.  Code Status :  Full  Consults  :   Trauma, Ortho, cardiology  PUD Prophylaxis :     Procedures  :            Disposition Plan  :    Status is: Inpatient   DVT Prophylaxis  :  SCDs    Lab Results  Component Value Date   PLT 170 02/17/2024    Diet :  Diet Order             Diet regular Room service appropriate? Yes; Fluid consistency: Thin  Diet effective now                    Inpatient Medications  Scheduled Meds:  acetaminophen   1,000 mg Oral Q8H   amiodarone   200 mg Oral Daily   atorvastatin   20 mg Oral Daily   carvedilol   6.25 mg Oral BID   Chlorhexidine  Gluconate Cloth  6 each Topical Daily   diltiazem   240 mg Oral Daily   feeding supplement  237 mL Oral BID BM   heparin  injection (subcutaneous)  5,000 Units Subcutaneous Q8H   multivitamin with minerals  1 tablet Oral Daily   potassium chloride   40 mEq Oral Once    QUEtiapine   25 mg Oral QHS   tamsulosin   0.4 mg Oral Daily   Continuous Infusions:   PRN Meds:.baclofen , docusate sodium , oxyCODONE , polyethylene glycol, prochlorperazine , traMADol   Antibiotics  :    Anti-infectives (From admission, onward)    None         Objective:   Vitals:   02/17/24 0500 02/17/24 0600 02/17/24 0625 02/17/24 0750  BP:    128/81  Pulse:    95  Resp: 18 20 20  (!) 25  Temp:    98.1 F (36.7 C)  TempSrc:    Oral  SpO2:    94%  Weight:      Height:        Wt Readings from Last 3 Encounters:  02/17/24 79.6 kg  01/25/24 68.9 kg  11/18/23 68 kg     Intake/Output Summary (Last 24 hours) at 02/17/2024 1020 Last data filed at 02/17/2024 0600 Gross per 24 hour  Intake 480 ml  Output 2125 ml  Net -1645 ml     Physical Exam  Awake - but confused, No new F.N deficits, Normal affect [place].AT,PERRAL Supple Neck, No JVD,   Symmetrical Chest wall movement, Good air movement bilaterally, CTAB RRR,No Gallops,Rubs or new Murmurs,  +ve B.Sounds, Abd Soft, No tenderness,   No Cyanosis, Clubbing or edema        Data Review:    Recent Labs  Lab 02/13/24 0213 02/13/24 1832 02/14/24 0649 02/15/24 0528 02/16/24 0512 02/17/24 0603  WBC  10.3 7.7 11.3* 8.9 8.5 7.9  HGB 6.3* 8.3* 8.6* 7.9* 8.1* 8.4*  HCT 21.6* 27.1* 27.4* 25.7* 26.7* 27.1*  PLT 183 160 145* 136* 143* 170  MCV 82.4 83.6 83.5 83.7 85.0 83.6  MCH 24.0* 25.6* 26.2 25.7* 25.8* 25.9*  MCHC 29.2* 30.6 31.4 30.7 30.3 31.0  RDW 17.7* 17.0* 17.8* 18.4* 18.9* 18.8*  LYMPHSABS 0.5*  --  0.5* 0.7 0.4* 0.6*  MONOABS 0.6  --  0.7 0.5 0.5 0.5  EOSABS 0.0  --  0.1 0.2 0.2 0.1  BASOSABS 0.0  --  0.0 0.0 0.0 0.0    Recent Labs  Lab 02/13/24 0213 02/14/24 0649 02/15/24 0527 02/15/24 0528 02/16/24 0512 02/17/24 0603  NA 140 140  --  143 140 143  K 3.7 4.4  --  3.8 4.2 3.6  CL 108 108  --  112* 112* 111  CO2 22 21*  --  25 21* 25  ANIONGAP 10 11  --  6 7 7   GLUCOSE 112* 103*  --  101*  111* 140*  BUN 20 19  --  18 20 20   CREATININE 1.10 1.01  --  0.98 0.92 1.12  AST 39  --   --   --   --   --   ALT 29  --   --   --   --   --   ALKPHOS 46  --   --   --   --   --   BILITOT 0.8  --   --   --   --   --   ALBUMIN 3.0*  --   --   --   --   --   TSH  --   --  0.849  --   --   --   MG  --  2.1  --  2.2 2.1 2.0  CALCIUM  8.0* 8.0*  --  8.1* 7.9* 8.1*      Recent Labs  Lab 02/13/24 0213 02/14/24 0649 02/15/24 0527 02/15/24 0528 02/16/24 0512 02/17/24 0603  TSH  --   --  0.849  --   --   --   MG  --  2.1  --  2.2 2.1 2.0  CALCIUM  8.0* 8.0*  --  8.1* 7.9* 8.1*    --------------------------------------------------------------------------------------------------------------- Lab Results  Component Value Date   CHOL 115 10/17/2021   HDL 47 10/17/2021   LDLCALC 57 10/17/2021   TRIG 47 10/17/2021   CHOLHDL 2.4 10/17/2021    Lab Results  Component Value Date   HGBA1C 5.8 (H) 03/25/2023   Recent Labs    02/15/24 0527  TSH 0.849  FREET4 1.08   No results for input(s): VITAMINB12, FOLATE, FERRITIN, TIBC, IRON, RETICCTPCT in the last 72 hours.     Radiology Report DG Chest Port 1 View Result Date: 02/16/2024 EXAM: 1 VIEW XRAY OF THE CHEST 02/16/2024 02:18:00 PM COMPARISON: 02/12/2024 CLINICAL HISTORY: SOB (shortness of breath) FINDINGS: LUNGS AND PLEURA: New bilateral pleural effusions, left greater than right. Mild diffuse increased interstitial markings compatible with pulmonary edema. Asymmetric opacity within the perihilar left lung may reflect an area of asymmetric edema or pneumonia. HEART AND MEDIASTINUM: No acute abnormality of the cardiac and mediastinal silhouettes. Aortic atherosclerotic calcification. BONES AND SOFT TISSUES: No acute osseous abnormality. Thoracic scoliosis with convexity towards the right as before. IMPRESSION: 1. New bilateral pleural effusions, left greater than right. 2. Mild diffuse increased interstitial markings  compatible with pulmonary edema. 3. Asymmetric opacity  within the perihilar left lung, possibly representing asymmetric edema or pneumonia. Electronically signed by: Waddell Calk MD 02/16/2024 02:34 PM EDT RP Workstation: [id number]     Signature  -   Lavada Stank M.D on 02/17/2024 at 10:20 AM   -  To page go to [email or web address]

## 2024-02-18 DIAGNOSIS — I4819 Other persistent atrial fibrillation: Secondary | ICD-10-CM | POA: Diagnosis not present

## 2024-02-18 DIAGNOSIS — Z7901 Long term (current) use of anticoagulants: Secondary | ICD-10-CM | POA: Diagnosis not present

## 2024-02-18 DIAGNOSIS — I480 Paroxysmal atrial fibrillation: Secondary | ICD-10-CM | POA: Diagnosis not present

## 2024-02-18 DIAGNOSIS — Z5181 Encounter for therapeutic drug level monitoring: Secondary | ICD-10-CM | POA: Diagnosis not present

## 2024-02-18 LAB — BASIC METABOLIC PANEL WITH GFR
Anion gap: 10 (ref 5–15)
BUN: 20 mg/dL (ref 8–23)
CO2: 22 mmol/L (ref 22–32)
Calcium: 8.1 mg/dL — ABNORMAL LOW (ref 8.9–10.3)
Chloride: 111 mmol/L (ref 98–111)
Creatinine, Ser: 0.93 mg/dL (ref 0.61–1.24)
GFR, Estimated: >60 mL/min (ref >60)
Glucose, Bld: 100 mg/dL — ABNORMAL HIGH (ref 70–99)
Potassium: 4.1 mmol/L (ref 3.5–5.1)
Sodium: 143 mmol/L (ref 135–145)

## 2024-02-18 LAB — CBC WITH DIFFERENTIAL/PLATELET
Abs Immature Granulocytes: 0.03 K/uL (ref 0.00–0.07)
Basophils Absolute: 0 K/uL (ref 0.0–0.1)
Basophils Relative: 0 %
Eosinophils Absolute: 0.1 K/uL (ref 0.0–0.5)
Eosinophils Relative: 2 %
HCT: 27 % — ABNORMAL LOW (ref 39.0–52.0)
Hemoglobin: 7.9 g/dL — ABNORMAL LOW (ref 13.0–17.0)
Immature Granulocytes: 0 %
Lymphocytes Relative: 10 %
Lymphs Abs: 0.7 K/uL (ref 0.7–4.0)
MCH: 25.5 pg — ABNORMAL LOW (ref 26.0–34.0)
MCHC: 29.3 g/dL — ABNORMAL LOW (ref 30.0–36.0)
MCV: 87.1 fL (ref 80.0–100.0)
Monocytes Absolute: 0.5 K/uL (ref 0.1–1.0)
Monocytes Relative: 7 %
Neutro Abs: 5.6 K/uL (ref 1.7–7.7)
Neutrophils Relative %: 81 %
Platelets: 139 K/uL — ABNORMAL LOW (ref 150–400)
RBC: 3.1 MIL/uL — ABNORMAL LOW (ref 4.22–5.81)
RDW: 19.3 % — ABNORMAL HIGH (ref 11.5–15.5)
WBC: 7 K/uL (ref 4.0–10.5)
nRBC: 0.7 % — ABNORMAL HIGH (ref 0.0–0.2)

## 2024-02-18 MED ORDER — FERROUS SULFATE 325 (65 FE) MG PO TABS
325.0000 mg | ORAL_TABLET | Freq: Two times a day (BID) | ORAL | Status: DC
  Administered 2024-02-18 – 2024-02-20 (×5): 325 mg via ORAL
  Filled 2024-02-18 (×5): qty 1

## 2024-02-18 MED ORDER — FOLIC ACID 1 MG PO TABS
1.0000 mg | ORAL_TABLET | Freq: Every day | ORAL | Status: DC
  Administered 2024-02-18 – 2024-02-20 (×3): 1 mg via ORAL
  Filled 2024-02-18 (×3): qty 1

## 2024-02-18 MED ORDER — FUROSEMIDE 10 MG/ML IJ SOLN
20.0000 mg | Freq: Once | INTRAMUSCULAR | Status: AC
  Administered 2024-02-18: 20 mg via INTRAVENOUS
  Filled 2024-02-18: qty 2

## 2024-02-18 NOTE — Plan of Care (Signed)

## 2024-02-18 NOTE — Progress Notes (Addendum)
   02/18/24 1052  Mobility  Activity Dangled on edge of bed (x6 Lateral scoots to [facility])  Level of Assistance Maximum assist, patient does 25-49% (MaxA for bed mobility. CG for lateral scoots.)  Assistive Device Other (Comment) (Bedrails)  Range of Motion/Exercises Active;Right leg;Left arm  RLE Weight Bearing Per Provider Order WBAT  LLE Weight Bearing Per Provider Order WBAT  Activity Response Tolerated fair  Mobility Referral Yes  Mobility visit 1 Mobility  Mobility Specialist Start Time (ACUTE ONLY) 1052  Mobility Specialist Stop Time (ACUTE ONLY) 1132  Mobility Specialist Time Calculation (min) (ACUTE ONLY) 40 min   Mobility Specialist: Progress Note  Pt agreeable to mobility session - received in bed. C/o 7/10  pubic pain and urgency for BM, very small stool smear, pericare with assistance. Pt required use of bedpan x2, no stool output. Pt refused to sit in chair - agreeable to sit EOB. Pt used BUEs to lateral scoot to HOB. Returned to sitting in chair position with all needs met - call bell within reach. Bed alarm on.   Virgle Boards, BS Mobility Specialist Please contact via SecureChat or  Rehab office at [phone].

## 2024-02-18 NOTE — Clinical Note (Incomplete)
 0515 Pt awake most of the shift, given scheduled Seroquel  25 mg was not effective for sleep.  Pt pulling off gown and pulling at his foley. Pt reminded several times to not pull at clothes.  Pt irritable and pulling at catheter not listening to the telemetry sitter,  Mittens applied briefly and patient calmed down, mittens removed after 20 minutes and patient then attempted to sleep.

## 2024-02-18 NOTE — Progress Notes (Signed)
 PROGRESS NOTE                                                                                                                                                                                                             Patient Demographics:    [name], is a 83 y.o. male, DOB - [birth year], FMW:[id number]  Outpatient Primary MD for the patient is Rexanne Ingle, MD    LOS - 5  Admit date - 02/12/2024    Chief Complaint  Patient presents with   Fall       Brief Narrative (HPI from H&P)   83 y.o. male with medical history significant of CAD, HFpEF, PAF, and HTN p/w GLF c/b b/l pubic rami fractures and found to have symptomatic anemia.   Pt was in his USOH until yesterday evening around 1700 when he fell. Pt reports bending over and reaching for an item in the lower cabinet, when he lost balance, and fell backwards onto his butt (no LOC or head strike). He was able to get himself off the ground before calling EMS. Pt denied any lightheadedness or dizziness prior to his fall. Of note, pt reports taking OAC for known Afib.   In the ED, pt tachycardic and hypertensive. Labs notable for Hb 6.3, Fe 34(low), TIBC 371 (nl), % sat 9 (low), and ferritin 13 (low). FOBT neg. CTH w/ NAICA. CT chest/abd/pelvis showed acute fractures of the right superior, left superior and inferior pubic rami with mild displacement suggested a lateral compression type injury, and adjacent extraperitoneal hemorrhage. EDP consulted Trauma/Ortho sx who recommended PT/OT evaluation, blood transfusion, and medicine admission.   Subjective:   Patient in bed, appears comfortable, denies any headache, no fever, no chest pain or pressure, no shortness of breath , no abdominal pain. No new focal weakness.   Assessment  & Plan :    Mechanical fall due to loss of balance causing B/l pubic rami fracture c/b adjacent hematoma and acute blood loss anemia  -Trauma  and Ortho consulted; it is post 2 units of packed RBC transfusion on 02/13/2024, CBC currently stable, for now continue to hold Eliquis , supportive care, PT OT, Ortho and trauma following.  Will require SNF.    Symptomatic anemia  - As above   Chronic urinary retention - Pt chronically self caths himself Q6 at home,  Foley placed 7/27, add Flomax  and monitor.   PAF [place] vas 2 score of greater than 3, had some RVR - HOLD pta apixaban  for now, continued on PTA amiodarone  200mg  daily and Coreg  6.25mg  BID, seen by cardiology, now also on Cardizem , dose adjusted 02/17/2024, case discussed with cardiology Eliquis  and day 8 from injury.      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  Gets delirious at home during nighttime, delirium likely will get worse in the hospital due to metabolic encephalopathy, nighttime Seroquel  and monitor.  Family updated in detail.      Condition - Extremely Guarded  Family Communication  :   Daughter Lyla [phone]  on 02/14/24, 02/15/2024, 02/17/2024, 02/18/2024,   Daughter Sandy on 02/16/2024 bedside.  Code Status :  Full  Consults  :   Trauma, Ortho, cardiology  PUD Prophylaxis :     Procedures  :            Disposition Plan  :    Status is: Inpatient   DVT Prophylaxis  :  SCDs    Lab Results  Component Value Date   PLT 139 (L) 02/18/2024    Diet :  Diet Order             Diet regular Room service appropriate? Yes; Fluid consistency: Thin  Diet effective now                    Inpatient Medications  Scheduled Meds:  acetaminophen   1,000 mg Oral Q8H   amiodarone   200 mg Oral Daily   atorvastatin   20 mg Oral Daily   carvedilol   6.25 mg Oral BID   Chlorhexidine  Gluconate Cloth  6 each Topical Daily   diltiazem   240 mg Oral Daily   feeding supplement  237 mL Oral BID BM   furosemide   20 mg Intravenous Once   heparin  injection (subcutaneous)  5,000 Units Subcutaneous Q8H   multivitamin with minerals  1  tablet Oral Daily   QUEtiapine   25 mg Oral QHS   tamsulosin   0.4 mg Oral Daily   Continuous Infusions:   PRN Meds:.baclofen , docusate sodium , oxyCODONE , polyethylene glycol, prochlorperazine , traMADol   Antibiotics  :    Anti-infectives (From admission, onward)    None         Objective:   Vitals:   02/17/24 2310 02/18/24 0400 02/18/24 0410 02/18/24 0800  BP: (!) 180/93  (!) 144/92 (!) 147/88  Pulse: (!) 102   72  Resp: (!) 25  19 (!) 24  Temp:  97.8 F (36.6 C) 97.8 F (36.6 C) 97.8 F (36.6 C)  TempSrc:  Oral Oral   SpO2: 92%  95% 93%  Weight:      Height:        Wt Readings from Last 3 Encounters:  02/17/24 79.6 kg  01/25/24 68.9 kg  11/18/23 68 kg     Intake/Output Summary (Last 24 hours) at 02/18/2024 1026 Last data filed at 02/18/2024 0400 Gross per 24 hour  Intake --  Output 700 ml  Net -700 ml     Physical Exam  Awake - but confused, No new F.N deficits, Normal affect [place].AT,PERRAL Supple Neck, No JVD,   Symmetrical Chest wall movement, Good air movement bilaterally, CTAB RRR,No Gallops,Rubs or new Murmurs,  +ve B.Sounds, Abd Soft, No tenderness,   No Cyanosis, Clubbing or edema        Data Review:    Recent Labs  Lab  02/14/24 9350 02/15/24 0528 02/16/24 0512 02/17/24 0603 02/18/24 0715  WBC 11.3* 8.9 8.5 7.9 7.0  HGB 8.6* 7.9* 8.1* 8.4* 7.9*  HCT 27.4* 25.7* 26.7* 27.1* 27.0*  PLT 145* 136* 143* 170 139*  MCV 83.5 83.7 85.0 83.6 87.1  MCH 26.2 25.7* 25.8* 25.9* 25.5*  MCHC 31.4 30.7 30.3 31.0 29.3*  RDW 17.8* 18.4* 18.9* 18.8* 19.3*  LYMPHSABS 0.5* 0.7 0.4* 0.6* 0.7  MONOABS 0.7 0.5 0.5 0.5 0.5  EOSABS 0.1 0.2 0.2 0.1 0.1  BASOSABS 0.0 0.0 0.0 0.0 0.0    Recent Labs  Lab 02/13/24 0213 02/14/24 0649 02/15/24 0527 02/15/24 0528 02/16/24 0512 02/17/24 0603 02/18/24 0715  NA 140 140  --  143 140 143 143  K 3.7 4.4  --  3.8 4.2 3.6 4.1  CL 108 108  --  112* 112* 111 111  CO2 22 21*  --  25 21* 25 22  ANIONGAP 10 11  --   6 7 7 10   GLUCOSE 112* 103*  --  101* 111* 140* 100*  BUN 20 19  --  18 20 20 20   CREATININE 1.10 1.01  --  0.98 0.92 1.12 0.93  AST 39  --   --   --   --   --   --   ALT 29  --   --   --   --   --   --   ALKPHOS 46  --   --   --   --   --   --   BILITOT 0.8  --   --   --   --   --   --   ALBUMIN 3.0*  --   --   --   --   --   --   TSH  --   --  0.849  --   --   --   --   MG  --  2.1  --  2.2 2.1 2.0  --   CALCIUM  8.0* 8.0*  --  8.1* 7.9* 8.1* 8.1*      Recent Labs  Lab 02/14/24 0649 02/15/24 0527 02/15/24 0528 02/16/24 0512 02/17/24 0603 02/18/24 0715  TSH  --  0.849  --   --   --   --   MG 2.1  --  2.2 2.1 2.0  --   CALCIUM  8.0*  --  8.1* 7.9* 8.1* 8.1*    --------------------------------------------------------------------------------------------------------------- Lab Results  Component Value Date   CHOL 115 10/17/2021   HDL 47 10/17/2021   LDLCALC 57 10/17/2021   TRIG 47 10/17/2021   CHOLHDL 2.4 10/17/2021    Lab Results  Component Value Date   HGBA1C 5.8 (H) 03/25/2023   No results for input(s): TSH, T4TOTAL, FREET4, T3FREE, THYROIDAB in the last 72 hours.  No results for input(s): VITAMINB12, FOLATE, FERRITIN, TIBC, IRON, RETICCTPCT in the last 72 hours.     Radiology Report DG Chest Port 1 View Result Date: 02/16/2024 EXAM: 1 VIEW XRAY OF THE CHEST 02/16/2024 02:18:00 PM COMPARISON: 02/12/2024 CLINICAL HISTORY: SOB (shortness of breath) FINDINGS: LUNGS AND PLEURA: New bilateral pleural effusions, left greater than right. Mild diffuse increased interstitial markings compatible with pulmonary edema. Asymmetric opacity within the perihilar left lung may reflect an area of asymmetric edema or pneumonia. HEART AND MEDIASTINUM: No acute abnormality of the cardiac and mediastinal silhouettes. Aortic atherosclerotic calcification. BONES AND SOFT TISSUES: No acute osseous abnormality. Thoracic scoliosis with convexity towards the right as before.  IMPRESSION: 1. New bilateral pleural effusions, left greater than right. 2. Mild diffuse increased interstitial markings compatible with pulmonary edema. 3. Asymmetric opacity within the perihilar left lung, possibly representing asymmetric edema or pneumonia. Electronically signed by: Waddell Calk MD 02/16/2024 02:34 PM EDT RP Workstation: [id number]     Signature  -   Lavada Stank M.D on 02/18/2024 at 10:26 AM   -  To page go to [email or web address]

## 2024-02-18 NOTE — Progress Notes (Signed)
  Progress Note  Patient Name: [name] Date of Encounter: 02/18/2024 [facility] Cardiologist: Wilbert Bihari, MD   Interval Summary   Patient resting this morning Denies any active symptoms  Will discharge to SNF per primary  Vital Signs Vitals:   02/17/24 2308 02/17/24 2310 02/18/24 0400 02/18/24 0410  BP: (!) 169/97 (!) 180/93  (!) 144/92  Pulse: (!) 110 (!) 102    Resp: (!) 21 (!) 25  19  Temp: 98.3 F (36.8 C)  97.8 F (36.6 C) 97.8 F (36.6 C)  TempSrc: Oral  Oral Oral  SpO2: 93% 92%  95%  Weight:      Height:       Intake/Output Summary (Last 24 hours) at 02/18/2024 0847 Last data filed at 02/18/2024 0400 Gross per 24 hour  Intake --  Output 700 ml  Net -700 ml      02/17/2024    4:45 AM 02/12/2024   11:29 PM 01/25/2024   10:07 AM  Last 3 Weights  Weight (lbs) 175 lb 7.8 oz 152 lb 1.9 oz 152 lb  Weight (kg) 79.6 kg 69 kg 68.947 kg     Telemetry/ECG  Atrial fibrillation, HR 70s - Personally Reviewed  Physical Exam  GEN: No acute distress, resting   Neck: No JVD Cardiac: iRRR, no murmurs, rubs, or gallops.  Respiratory: Clear to auscultation bilaterally. GI: Soft, nontender, non-distended  MS: No edema  Assessment & Plan  Persistent atrial fibrillation  Sick sinus syndrome  Not candidate for Watchman per Dr. Cindie Remains in A. Fib with good HR control, rates 70s this AM  Continue amiodarone  200 mg daily Continue Coreg  6.25 mg BID  Continue diltiazem  240 mg daily Holding Eliquis  in the setting of ABLA -- hemoglobin 7.9 today, trending down   Chronic HFpEF Echo Dec. 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 2022 showed no changes, low risk study No current anginal symptoms   Per primary Mechanical fall Pubic rami fracture Hematoma Acute blood  loss anemia Symptomatic anemia Urinary retention Hyperlipidemia Cognitive decline   For questions or updates, please contact [facility] Please consult [email or web address] for contact info under       Signed, Waddell DELENA Donath, PA-C

## 2024-02-19 DIAGNOSIS — W19XXXA Unspecified fall, initial encounter: Secondary | ICD-10-CM | POA: Diagnosis not present

## 2024-02-19 DIAGNOSIS — I480 Paroxysmal atrial fibrillation: Secondary | ICD-10-CM | POA: Diagnosis not present

## 2024-02-19 LAB — CBC WITH DIFFERENTIAL/PLATELET
Abs Immature Granulocytes: 0.03 K/uL (ref 0.00–0.07)
Basophils Absolute: 0 K/uL (ref 0.0–0.1)
Basophils Relative: 0 %
Eosinophils Absolute: 0.2 K/uL (ref 0.0–0.5)
Eosinophils Relative: 2 %
HCT: 29.5 % — ABNORMAL LOW (ref 39.0–52.0)
Hemoglobin: 8.8 g/dL — ABNORMAL LOW (ref 13.0–17.0)
Immature Granulocytes: 0 %
Lymphocytes Relative: 12 %
Lymphs Abs: 0.9 K/uL (ref 0.7–4.0)
MCH: 25.8 pg — ABNORMAL LOW (ref 26.0–34.0)
MCHC: 29.8 g/dL — ABNORMAL LOW (ref 30.0–36.0)
MCV: 86.5 fL (ref 80.0–100.0)
Monocytes Absolute: 0.5 K/uL (ref 0.1–1.0)
Monocytes Relative: 7 %
Neutro Abs: 5.6 K/uL (ref 1.7–7.7)
Neutrophils Relative %: 79 %
Platelets: 200 K/uL (ref 150–400)
RBC: 3.41 MIL/uL — ABNORMAL LOW (ref 4.22–5.81)
RDW: 19.8 % — ABNORMAL HIGH (ref 11.5–15.5)
WBC: 7.2 K/uL (ref 4.0–10.5)
nRBC: 0.8 % — ABNORMAL HIGH (ref 0.0–0.2)

## 2024-02-19 LAB — BASIC METABOLIC PANEL WITH GFR
Anion gap: 10 (ref 5–15)
BUN: 19 mg/dL (ref 8–23)
CO2: 26 mmol/L (ref 22–32)
Calcium: 8.3 mg/dL — ABNORMAL LOW (ref 8.9–10.3)
Chloride: 109 mmol/L (ref 98–111)
Creatinine, Ser: 0.81 mg/dL (ref 0.61–1.24)
GFR, Estimated: >60 mL/min (ref >60)
Glucose, Bld: 92 mg/dL (ref 70–99)
Potassium: 4 mmol/L (ref 3.5–5.1)
Sodium: 145 mmol/L (ref 135–145)

## 2024-02-19 LAB — TYPE AND SCREEN
ABO/RH(D): O POS
Antibody Screen: NEGATIVE

## 2024-02-19 MED ORDER — FUROSEMIDE 10 MG/ML IJ SOLN
20.0000 mg | Freq: Once | INTRAMUSCULAR | Status: AC
  Administered 2024-02-19: 20 mg via INTRAVENOUS
  Filled 2024-02-19: qty 2

## 2024-02-19 MED ORDER — MAGNESIUM HYDROXIDE 400 MG/5ML PO SUSP
30.0000 mL | Freq: Two times a day (BID) | ORAL | Status: AC
  Administered 2024-02-19 (×2): 30 mL via ORAL
  Filled 2024-02-19 (×2): qty 30

## 2024-02-19 MED ORDER — POLYETHYLENE GLYCOL 3350 17 G PO PACK
17.0000 g | PACK | Freq: Two times a day (BID) | ORAL | Status: DC
  Administered 2024-02-19 – 2024-02-20 (×3): 17 g via ORAL
  Filled 2024-02-19 (×2): qty 1

## 2024-02-19 MED ORDER — DOCUSATE SODIUM 100 MG PO CAPS
200.0000 mg | ORAL_CAPSULE | Freq: Two times a day (BID) | ORAL | Status: DC
  Administered 2024-02-19 – 2024-02-20 (×3): 200 mg via ORAL
  Filled 2024-02-19 (×3): qty 2

## 2024-02-19 NOTE — Progress Notes (Signed)
 PROGRESS NOTE                                                                                                                                                                                                             Patient Demographics:    [name], is a 83 y.o. male, DOB - 1940-09-07, FMW:[id number]  Outpatient Primary MD for the patient is Rexanne Ingle, MD    LOS - 6  Admit date - 02/12/2024    Chief Complaint  Patient presents with   Fall       Brief Narrative (HPI from H&P)   83 y.o. male with medical history significant of CAD, HFpEF, PAF, and HTN p/w GLF c/b b/l pubic rami fractures and found to have symptomatic anemia.   Pt was in his USOH until yesterday evening around 1700 when he fell. Pt reports bending over and reaching for an item in the lower cabinet, when he lost balance, and fell backwards onto his butt (no LOC or head strike). He was able to get himself off the ground before calling EMS. Pt denied any lightheadedness or dizziness prior to his fall. Of note, pt reports taking OAC for known Afib.   In the ED, pt tachycardic and hypertensive. Labs notable for Hb 6.3, Fe 34(low), TIBC 371 (nl), % sat 9 (low), and ferritin 13 (low). FOBT neg. CTH w/ NAICA. CT chest/abd/pelvis showed acute fractures of the right superior, left superior and inferior pubic rami with mild displacement suggested a lateral compression type injury, and adjacent extraperitoneal hemorrhage. EDP consulted Trauma/Ortho sx who recommended PT/OT evaluation, blood transfusion, and medicine admission.   Subjective:   Patient in bed, appears comfortable, denies any headache, no fever, no chest pain or pressure, no shortness of breath , no abdominal pain. No new focal weakness.    Assessment  & Plan :    Mechanical fall due to loss of balance causing B/l pubic rami fracture c/b adjacent hematoma and acute blood loss anemia   -Trauma and Ortho consulted; it is post 2 units of packed RBC transfusion on 02/13/2024, CBC currently stable, for now continue to hold Eliquis , supportive care, PT OT, Ortho and trauma following.  Will require SNF.    Symptomatic anemia  - H&H currently stable continue to   Chronic urinary retention - Pt chronically self caths himself  Q6 at home,  Foley placed 7/27, add Flomax  and monitor.   PAF [place] vas 2 score of greater than 3, had some RVR - HOLD pta apixaban  for now, continued on PTA amiodarone  200mg  daily and Coreg  6.25mg  BID, seen by cardiology, now also on Cardizem , dose adjusted 02/17/2024, case discussed with cardiology Eliquis  and day 8 from injury.      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  Gets delirious at home during nighttime, delirium likely will get worse in the hospital due to metabolic encephalopathy, nighttime Seroquel  and monitor.  Family updated in detail.      Condition - Extremely Guarded  Family Communication  :   Daughter Lyla [phone]  on 02/14/24, 02/15/2024, 02/17/2024, 02/18/2024, 02/19/2024  Daughter Sandy on 02/16/2024 bedside.  Code Status :  Full  Consults  :   Trauma, Ortho, cardiology  PUD Prophylaxis :     Procedures  :            Disposition Plan  :    Status is: Inpatient   DVT Prophylaxis  :  SCDs    Lab Results  Component Value Date   PLT 200 02/19/2024    Diet :  Diet Order             Diet regular Room service appropriate? Yes; Fluid consistency: Thin  Diet effective now                    Inpatient Medications  Scheduled Meds:  acetaminophen   1,000 mg Oral Q8H   amiodarone   200 mg Oral Daily   atorvastatin   20 mg Oral Daily   carvedilol   6.25 mg Oral BID   Chlorhexidine  Gluconate Cloth  6 each Topical Daily   diltiazem   240 mg Oral Daily   feeding supplement  237 mL Oral BID BM   ferrous sulfate   325 mg Oral BID WC   folic acid   1 mg Oral Daily   furosemide   20 mg Intravenous  Once   heparin  injection (subcutaneous)  5,000 Units Subcutaneous Q8H   multivitamin with minerals  1 tablet Oral Daily   QUEtiapine   25 mg Oral QHS   tamsulosin   0.4 mg Oral Daily   Continuous Infusions:   PRN Meds:.baclofen , docusate sodium , oxyCODONE , polyethylene glycol, prochlorperazine , traMADol   Antibiotics  :    Anti-infectives (From admission, onward)    None         Objective:   Vitals:   02/18/24 2000 02/19/24 0000 02/19/24 0400 02/19/24 0800  BP: 103/61 122/75 107/70 (!) 148/95  Pulse: (!) 209 66 78 91  Resp: (!) 21 20 19 18   Temp:  98.4 F (36.9 C) 97.9 F (36.6 C)   TempSrc:  Oral Oral   SpO2: 97% 100% 95% 98%  Weight:      Height:        Wt Readings from Last 3 Encounters:  02/17/24 79.6 kg  01/25/24 68.9 kg  11/18/23 68 kg     Intake/Output Summary (Last 24 hours) at 02/19/2024 1007 Last data filed at 02/19/2024 9367 Gross per 24 hour  Intake --  Output 1280 ml  Net -1280 ml     Physical Exam  Awake - but confused, No new F.N deficits, Normal affect [place].AT,PERRAL Supple Neck, No JVD,   Symmetrical Chest wall movement, Good air movement bilaterally, CTAB RRR,No Gallops,Rubs or new Murmurs,  +ve B.Sounds, Abd Soft, No tenderness,   No Cyanosis, Clubbing or edema  Data Review:    Recent Labs  Lab 02/15/24 0528 02/16/24 0512 02/17/24 0603 02/18/24 0715 02/19/24 0701  WBC 8.9 8.5 7.9 7.0 7.2  HGB 7.9* 8.1* 8.4* 7.9* 8.8*  HCT 25.7* 26.7* 27.1* 27.0* 29.5*  PLT 136* 143* 170 139* 200  MCV 83.7 85.0 83.6 87.1 86.5  MCH 25.7* 25.8* 25.9* 25.5* 25.8*  MCHC 30.7 30.3 31.0 29.3* 29.8*  RDW 18.4* 18.9* 18.8* 19.3* 19.8*  LYMPHSABS 0.7 0.4* 0.6* 0.7 0.9  MONOABS 0.5 0.5 0.5 0.5 0.5  EOSABS 0.2 0.2 0.1 0.1 0.2  BASOSABS 0.0 0.0 0.0 0.0 0.0    Recent Labs  Lab 02/13/24 0213 02/14/24 0649 02/15/24 0527 02/15/24 0528 02/16/24 0512 02/17/24 0603 02/18/24 0715 02/19/24 0701  NA 140 140  --  143 140 143 143 145  K 3.7 4.4   --  3.8 4.2 3.6 4.1 4.0  CL 108 108  --  112* 112* 111 111 109  CO2 22 21*  --  25 21* 25 22 26   ANIONGAP 10 11  --  6 7 7 10 10   GLUCOSE 112* 103*  --  101* 111* 140* 100* 92  BUN 20 19  --  18 20 20 20 19   CREATININE 1.10 1.01  --  0.98 0.92 1.12 0.93 0.81  AST 39  --   --   --   --   --   --   --   ALT 29  --   --   --   --   --   --   --   ALKPHOS 46  --   --   --   --   --   --   --   BILITOT 0.8  --   --   --   --   --   --   --   ALBUMIN 3.0*  --   --   --   --   --   --   --   TSH  --   --  0.849  --   --   --   --   --   MG  --  2.1  --  2.2 2.1 2.0  --   --   CALCIUM  8.0* 8.0*  --  8.1* 7.9* 8.1* 8.1* 8.3*      Recent Labs  Lab 02/14/24 0649 02/15/24 0527 02/15/24 0528 02/16/24 0512 02/17/24 0603 02/18/24 0715 02/19/24 0701  TSH  --  0.849  --   --   --   --   --   MG 2.1  --  2.2 2.1 2.0  --   --   CALCIUM  8.0*  --  8.1* 7.9* 8.1* 8.1* 8.3*    --------------------------------------------------------------------------------------------------------------- Lab Results  Component Value Date   CHOL 115 10/17/2021   HDL 47 10/17/2021   LDLCALC 57 10/17/2021   TRIG 47 10/17/2021   CHOLHDL 2.4 10/17/2021    Lab Results  Component Value Date   HGBA1C 5.8 (H) 03/25/2023   No results for input(s): TSH, T4TOTAL, FREET4, T3FREE, THYROIDAB in the last 72 hours.  No results for input(s): VITAMINB12, FOLATE, FERRITIN, TIBC, IRON, RETICCTPCT in the last 72 hours.     Radiology Report No results found.    Signature  -   Lavada Stank M.D on 02/19/2024 at 10:07 AM   -  To page go to [email or web address]

## 2024-02-19 NOTE — Plan of Care (Signed)

## 2024-02-19 NOTE — Plan of Care (Signed)

## 2024-02-19 NOTE — Plan of Care (Signed)
  Problem: Education: Goal: Knowledge of General Education information will improve Description: Including pain rating scale, medication(s)/side effects and non-pharmacologic comfort measures Outcome: Not Progressing   Problem: Health Behavior/Discharge Planning: Goal: Ability to manage health-related needs will improve Outcome: Not Progressing   Problem: Clinical Measurements: Goal: Ability to maintain clinical measurements within normal limits will improve Outcome: Not Progressing Goal: Will remain free from infection Outcome: Not Progressing Goal: Diagnostic test results will improve Outcome: Not Progressing Goal: Respiratory complications will improve Outcome: Not Progressing Goal: Cardiovascular complication will be avoided Outcome: Not Progressing   Problem: Activity: Goal: Risk for activity intolerance will decrease Outcome: Not Progressing   Problem: Nutrition: Goal: Adequate nutrition will be maintained Outcome: Not Progressing   Problem: Coping: Goal: Level of anxiety will decrease Outcome: Not Progressing   Problem: Elimination: Goal: Will not experience complications related to bowel motility Outcome: Not Progressing Goal: Will not experience complications related to urinary retention Outcome: Not Progressing   Problem: Pain Managment: Goal: General experience of comfort will improve and/or be controlled Outcome: Not Progressing   Problem: Safety: Goal: Ability to remain free from injury will improve Outcome: Not Progressing   Problem: Skin Integrity: Goal: Risk for impaired skin integrity will decrease Outcome: Not Progressing   Problem: Safety: Goal: Non-violent Restraint(s) Outcome: Completed/Met

## 2024-02-20 DIAGNOSIS — R2681 Unsteadiness on feet: Secondary | ICD-10-CM | POA: Diagnosis not present

## 2024-02-20 DIAGNOSIS — D649 Anemia, unspecified: Secondary | ICD-10-CM | POA: Diagnosis not present

## 2024-02-20 DIAGNOSIS — R5381 Other malaise: Secondary | ICD-10-CM | POA: Diagnosis not present

## 2024-02-20 DIAGNOSIS — I4811 Longstanding persistent atrial fibrillation: Secondary | ICD-10-CM | POA: Diagnosis not present

## 2024-02-20 DIAGNOSIS — I480 Paroxysmal atrial fibrillation: Secondary | ICD-10-CM | POA: Diagnosis not present

## 2024-02-20 DIAGNOSIS — S32511D Fracture of superior rim of right pubis, subsequent encounter for fracture with routine healing: Secondary | ICD-10-CM | POA: Diagnosis not present

## 2024-02-20 DIAGNOSIS — G8911 Acute pain due to trauma: Secondary | ICD-10-CM | POA: Diagnosis not present

## 2024-02-20 DIAGNOSIS — I5032 Chronic diastolic (congestive) heart failure: Secondary | ICD-10-CM | POA: Diagnosis not present

## 2024-02-20 DIAGNOSIS — W19XXXD Unspecified fall, subsequent encounter: Secondary | ICD-10-CM | POA: Diagnosis not present

## 2024-02-20 DIAGNOSIS — M6281 Muscle weakness (generalized): Secondary | ICD-10-CM | POA: Diagnosis not present

## 2024-02-20 DIAGNOSIS — Z5181 Encounter for therapeutic drug level monitoring: Secondary | ICD-10-CM | POA: Diagnosis not present

## 2024-02-20 DIAGNOSIS — W19XXXA Unspecified fall, initial encounter: Secondary | ICD-10-CM | POA: Diagnosis not present

## 2024-02-20 DIAGNOSIS — R41841 Cognitive communication deficit: Secondary | ICD-10-CM | POA: Diagnosis not present

## 2024-02-20 DIAGNOSIS — Z7901 Long term (current) use of anticoagulants: Secondary | ICD-10-CM | POA: Diagnosis not present

## 2024-02-20 DIAGNOSIS — R339 Retention of urine, unspecified: Secondary | ICD-10-CM | POA: Diagnosis not present

## 2024-02-20 DIAGNOSIS — Z7401 Bed confinement status: Secondary | ICD-10-CM | POA: Diagnosis not present

## 2024-02-20 DIAGNOSIS — Z9181 History of falling: Secondary | ICD-10-CM | POA: Diagnosis not present

## 2024-02-20 DIAGNOSIS — J9601 Acute respiratory failure with hypoxia: Secondary | ICD-10-CM | POA: Diagnosis not present

## 2024-02-20 DIAGNOSIS — R262 Difficulty in walking, not elsewhere classified: Secondary | ICD-10-CM | POA: Diagnosis not present

## 2024-02-20 DIAGNOSIS — R4189 Other symptoms and signs involving cognitive functions and awareness: Secondary | ICD-10-CM | POA: Diagnosis not present

## 2024-02-20 DIAGNOSIS — R0902 Hypoxemia: Secondary | ICD-10-CM | POA: Diagnosis not present

## 2024-02-20 DIAGNOSIS — I2510 Atherosclerotic heart disease of native coronary artery without angina pectoris: Secondary | ICD-10-CM | POA: Diagnosis not present

## 2024-02-20 DIAGNOSIS — R410 Disorientation, unspecified: Secondary | ICD-10-CM | POA: Diagnosis not present

## 2024-02-20 DIAGNOSIS — I10 Essential (primary) hypertension: Secondary | ICD-10-CM | POA: Diagnosis not present

## 2024-02-20 DIAGNOSIS — E612 Magnesium deficiency: Secondary | ICD-10-CM | POA: Diagnosis not present

## 2024-02-20 LAB — CBC WITH DIFFERENTIAL/PLATELET
Abs Immature Granulocytes: 0.04 K/uL (ref 0.00–0.07)
Basophils Absolute: 0 K/uL (ref 0.0–0.1)
Basophils Relative: 0 %
Eosinophils Absolute: 0.2 K/uL (ref 0.0–0.5)
Eosinophils Relative: 3 %
HCT: 29.6 % — ABNORMAL LOW (ref 39.0–52.0)
Hemoglobin: 8.8 g/dL — ABNORMAL LOW (ref 13.0–17.0)
Immature Granulocytes: 1 %
Lymphocytes Relative: 9 %
Lymphs Abs: 0.8 K/uL (ref 0.7–4.0)
MCH: 25.7 pg — ABNORMAL LOW (ref 26.0–34.0)
MCHC: 29.7 g/dL — ABNORMAL LOW (ref 30.0–36.0)
MCV: 86.3 fL (ref 80.0–100.0)
Monocytes Absolute: 0.7 K/uL (ref 0.1–1.0)
Monocytes Relative: 9 %
Neutro Abs: 6.3 K/uL (ref 1.7–7.7)
Neutrophils Relative %: 78 %
Platelets: 248 K/uL (ref 150–400)
RBC: 3.43 MIL/uL — ABNORMAL LOW (ref 4.22–5.81)
RDW: 20 % — ABNORMAL HIGH (ref 11.5–15.5)
WBC: 8 K/uL (ref 4.0–10.5)
nRBC: 0.3 % — ABNORMAL HIGH (ref 0.0–0.2)

## 2024-02-20 MED ORDER — DILTIAZEM HCL ER COATED BEADS 240 MG PO CP24: 240.0000 mg | ORAL_CAPSULE | Freq: Every day | ORAL | Status: AC

## 2024-02-20 MED ORDER — DOCUSATE SODIUM 100 MG PO CAPS: 200.0000 mg | ORAL_CAPSULE | Freq: Two times a day (BID) | ORAL | Status: AC

## 2024-02-20 MED ORDER — PANTOPRAZOLE SODIUM 40 MG PO TBEC: 40.0000 mg | DELAYED_RELEASE_TABLET | Freq: Every day | ORAL | Status: AC

## 2024-02-20 MED ORDER — QUETIAPINE FUMARATE 25 MG PO TABS: 12.5000 mg | ORAL_TABLET | Freq: Every day | ORAL | Status: AC

## 2024-02-20 MED ORDER — FERROUS SULFATE 325 (65 FE) MG PO TABS: 325.0000 mg | ORAL_TABLET | Freq: Every day | ORAL | Status: AC

## 2024-02-20 MED ORDER — TAMSULOSIN HCL 0.4 MG PO CAPS: 0.4000 mg | ORAL_CAPSULE | Freq: Every day | ORAL | Status: AC

## 2024-02-20 MED ORDER — FOLIC ACID 1 MG PO TABS: 1.0000 mg | ORAL_TABLET | Freq: Every day | ORAL | Status: AC

## 2024-02-20 MED ORDER — LACTULOSE 10 GM/15ML PO SOLN: 30.0000 g | Freq: Every day | ORAL | Status: AC | PRN

## 2024-02-20 MED ORDER — APIXABAN 2.5 MG PO TABS: 2.5000 mg | ORAL_TABLET | Freq: Two times a day (BID) | ORAL | Status: DC

## 2024-02-20 NOTE — TOC Transition Note (Signed)
 Transition of Care [facility]) - Discharge Note   Patient Details  Name: [name] MRN: [id number] Date of Birth: 1940/11/02  Transition of [facility]) CM/SW Contact:  [name], LCSW Phone Number: 02/20/2024, 10:48 AM   Clinical Narrative:     Per MD patient ready for DC to [facility]. RN, patient, patient's family, and facility notified of DC. Discharge Summary and FL2 sent to facility. DC packet on chart. Insurance shara has been received.  Ambulance transport requested for patient.    RN to call report to [phone].  CSW will sign off for now as social work intervention is no longer needed. Please consult us  again if new needs arise.   Final next level of care: Skilled Nursing Facility Barriers to Discharge: Barriers Resolved   Patient Goals and CMS Choice Patient states their goals for this hospitalization and ongoing recovery are:: Rehab CMS Medicare.gov Compare Post Acute Care list provided to:: Patient Represenative (must comment) Choice offered to / list presented to : Adult Children [place] ownership interest in [facility].provided to:: Adult Children    Discharge Placement              Patient chooses bed at: [facility] Patient to be transferred to facility by: [facility] Name of family member notified: Daughter, [name] Patient SSN [id number] Patient and family notified of of transfer: 02/20/24  Discharge Plan and Services Additional resources added to the After Visit Summary for   In-house Referral: Clinical Social Work   Post Acute Care Choice: Skilled Nursing Facility                               Social Drivers of Health (SDOH) Interventions SDOH Screenings   Food Insecurity: No Food Insecurity (02/13/2024)  Housing: Low Risk  (02/13/2024)  Transportation Needs: No Transportation Needs (02/13/2024)  Utilities: Not At Risk (02/13/2024)  Depression (PHQ2-9): Low Risk  (09/01/2023)  Social Connections: Moderately  Integrated (02/13/2024)  Tobacco Use: Low Risk  (01/25/2024)     Readmission Risk Interventions     No data to display

## 2024-02-20 NOTE — Progress Notes (Signed)
 1645 called updated report to [place] called daughter Darice to let her know [facility] is here to  get patient

## 2024-02-20 NOTE — Discharge Summary (Addendum)
 [name] FMW:[id number] DOB: September 20, 1940 DOA: 02/12/2024  PCP: Rexanne Ingle, MD  Admit date: 02/12/2024  Discharge date: 02/20/2024  Admitted From: Home   Disposition:  SNF   Recommendations for Outpatient Follow-up:   Follow up with PCP in 1-2 weeks  PCP Please obtain BMP/CBC, 2 view CXR in 1week,  (see Discharge instructions)   PCP Please follow up on the following pending results:    Home Health: None Equipment/Devices: None Consultations: Orthopedics, cardiology Discharge Condition: Stable    CODE STATUS: Full    Diet Recommendation: Heart Healthy with 1.5 L fluid restriction per day    Chief Complaint  Patient presents with   Fall     Brief history of present illness from the day of admission and additional interim summary    83 y.o. male with medical history significant of CAD, HFpEF, PAF, and HTN p/w GLF c/b b/l pubic rami fractures and found to have symptomatic anemia.   Pt was in his USOH until yesterday evening around 1700 when he fell. Pt reports bending over and reaching for an item in the lower cabinet, when he lost balance, and fell backwards onto his butt (no LOC or head strike). He was able to get himself off the ground before calling EMS. Pt denied any lightheadedness or dizziness prior to his fall. Of note, pt reports taking OAC for known Afib.   In the ED, pt tachycardic and hypertensive. Labs notable for Hb 6.3, Fe 34(low), TIBC 371 (nl), % sat 9 (low), and ferritin 13 (low). FOBT neg. CTH w/ NAICA. CT chest/abd/pelvis showed acute fractures of the right superior, left superior and inferior pubic rami with mild displacement suggested a lateral compression type injury, and adjacent extraperitoneal hemorrhage. EDP consulted Trauma/Ortho sx who recommended PT/OT evaluation, blood  transfusion, and medicine admission.                                                                 Hospital Course   Mechanical fall due to loss of balance causing B/l pubic rami fracture c/b adjacent hematoma and acute blood loss anemia -Trauma and Ortho consulted; it is post 2 units of packed RBC transfusion on 02/13/2024, CBC currently stable, continue supportive care, PT OT, SNF discharge with full fall precautions and assistance, continue to hold Eliquis , start with caution on 02/22/2024, monitor CBC few days thereafter.   Currently patient is using 2 L nasal cannula oxygen on a as needed basis upon exertion, continue to use as needed.    Symptomatic anemia  - As above, did not require any further transfusions after initial 2 units on admission, placed on oral iron and folic acid  along with PPI.  No signs of ongoing bleeding.   Chronic urinary retention  Pt with I/O cath prn pta, - Foley placed  7/27, add Flomax , remove Foley by 02/24/2024, note patient does self catheterizations 4-5 times a day at baseline.   PAF [place] vas 2 score of greater than 3  -Eliquis  as above, PTA amiodarone  200mg  daily and Coreg  6.25mg  BID.  Seen by cardiology Cardizem  also added for better rate control.    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  Gets delirious at home during nighttime, delirium likely will get worse in the hospital due to metabolic encephalopathy, nighttime Seroquel  low-dose added with good results.    Discharge diagnosis     Principal Problem:   Fall Active Problems:   Anticoagulation management encounter    Discharge instructions    Discharge Instructions     Discharge instructions   Complete by: As directed    Follow with Primary MD Rexanne Ingle, MD in 7 days   Get CBC, CMP, Magnesium , 2 view Chest X ray -  checked by SNF MD in 3 to 4 days.  Activity: As tolerated with Full fall precautions use walker/cane & assistance as needed  Disposition Home    Diet: Heart Healthy with 1.5 L fluid restriction.  Feeding assistance and aspiration precautions.  Special Instructions: If you have smoked or chewed Tobacco  in the last 2 yrs please stop smoking, stop any regular Alcohol  and or any Recreational drug use.  On your next visit with your primary care physician please Get Medicines reviewed and adjusted.  Please request your Prim.MD to go over all Hospital Tests and Procedure/Radiological results at the follow up, please get all Hospital records sent to your Prim MD by signing hospital release before you go home.  If you experience worsening of your admission symptoms, develop shortness of breath, life threatening emergency, suicidal or homicidal thoughts you must seek medical attention immediately by calling 911 or calling your MD immediately  if symptoms less severe.  You Must read complete instructions/literature along with all the possible adverse reactions/side effects for all the Medicines you take and that have been prescribed to you. Take any new Medicines after you have completely understood and accpet all the possible adverse reactions/side effects.   Do not drive when taking Pain medications.  Do not take more than prescribed Pain, Sleep and Anxiety Medications  Wear Seat belts while driving.   Increase activity slowly   Complete by: As directed    No wound care   Complete by: As directed        Discharge Medications   Allergies as of 02/20/2024   No Known Allergies      Medication List     TAKE these medications    acetaminophen  325 MG tablet Commonly known as: Tylenol  Take 2 tablets (650 mg total) by mouth every 6 (six) hours as needed for moderate pain. What changed: when to take this   amiodarone  200 MG tablet Commonly known as: PACERONE  Take 200 mg by mouth daily.   apixaban  2.5 MG Tabs tablet Commonly known as: ELIQUIS  Take 1 tablet (2.5 mg total) by mouth 2 (two) times daily. Start taking on: February 22, 2024 What changed: These instructions start on February 22, 2024. If you are unsure what to do until then, ask your doctor or other care provider.   atorvastatin  20 MG tablet Commonly known as: LIPITOR TAKE 1 TABLET(20 MG) BY MOUTH DAILY   BOSWELLIA PO Take 1,200 mg by mouth daily.   carvedilol  6.25 MG tablet Commonly known as: COREG  Take 1 tablet (6.25 mg  total) by mouth 2 (two) times daily.   diltiazem  240 MG 24 hr capsule Commonly known as: CARDIZEM  CD Take 1 capsule (240 mg total) by mouth daily.   docusate sodium  100 MG capsule Commonly known as: COLACE Take 2 capsules (200 mg total) by mouth 2 (two) times daily.   ferrous sulfate  325 (65 FE) MG tablet Take 1 tablet (325 mg total) by mouth daily with breakfast.   Fish Oil 600 MG Caps Take 1,200 mg by mouth in the morning and at bedtime.   folic acid  1 MG tablet Commonly known as: FOLVITE  Take 1 tablet (1 mg total) by mouth daily.   furosemide  20 MG tablet Commonly known as: LASIX  Take 20 mg by mouth 2 (two) times daily.   Glucosamine Chondroitin Triple Tabs Take 1 tablet by mouth daily.   lactulose  10 GM/15ML solution Commonly known as: CHRONULAC  Take 45 mLs (30 g total) by mouth daily as needed for moderate constipation.   lidocaine  5 % Commonly known as: LIDODERM  Place 1 patch onto the skin daily. Apply to lower back in the morning and take off after 12hrs.   multivitamin with minerals Tabs tablet Take 1 tablet by mouth daily.   pantoprazole  40 MG tablet Commonly known as: Protonix  Take 1 tablet (40 mg total) by mouth daily.   QUEtiapine  25 MG tablet Commonly known as: SEROQUEL  Take 0.5 tablets (12.5 mg total) by mouth at bedtime.   tamsulosin  0.4 MG Caps capsule Commonly known as: FLOMAX  Take 1 capsule (0.4 mg total) by mouth daily.         Follow-up Information     Rexanne Ingle, MD. Schedule an appointment as soon as possible for a visit in 1 week(s).   Specialty: Internal Medicine Why:  Also follow-up with your cardiologist and gastroenterologist in 1 to 2 weeks postdischarge. Contact information: [address] [place] [address] [phone]         Shlomo Wilbert SAUNDERS, MD. Schedule an appointment as soon as possible for a visit in 1 week(s).   Specialty: Cardiology Contact information: [address] [place] [address] [phone]         Barton Drape, MD. Schedule an appointment as soon as possible for a visit in 1 week(s).   Specialty: Orthopedic Surgery Contact information: [address] [place] [address] [phone]                 Major procedures and Radiology Reports - PLEASE review detailed and final reports thoroughly  -       DG Chest Port 1 View Result Date: 02/16/2024 EXAM: 1 VIEW XRAY OF THE CHEST 02/16/2024 02:18:00 PM COMPARISON: 02/12/2024 CLINICAL HISTORY: SOB (shortness of breath) FINDINGS: LUNGS AND PLEURA: New bilateral pleural effusions, left greater than right. Mild diffuse increased interstitial markings compatible with pulmonary edema. Asymmetric opacity within the perihilar left lung may reflect an area of asymmetric edema or pneumonia. HEART AND MEDIASTINUM: No acute abnormality of the cardiac and mediastinal silhouettes. Aortic atherosclerotic calcification. BONES AND SOFT TISSUES: No acute osseous abnormality. Thoracic scoliosis with convexity towards the right as before. IMPRESSION: 1. New bilateral pleural effusions, left greater than right. 2. Mild diffuse increased interstitial markings compatible with pulmonary edema. 3. Asymmetric opacity within the perihilar left lung, possibly representing asymmetric edema or pneumonia. Electronically signed by: Waddell Calk MD 02/16/2024 02:34 PM EDT RP Workstation: [id number]   DG Pelvis Comp Min 3V Result Date: 02/14/2024 CLINICAL DATA:  Close pelvic fracture.  EXAM: [facility] PELVIS - 3+ VIEW COMPARISON:  CT chest, abdomen, and pelvis  dated 02/13/2024. FINDINGS: Displaced fractures of the right superior pubic ramus and the left superior and inferior pubic rami. Right sacral ala fracture is better visualized on the prior CT. Bilateral total hip arthroplasties remain aligned. Degenerative changes of the visualized lower lumbar spine. Diffuse osseous demineralization. IMPRESSION: Displaced fractures of the left superior and inferior pubic rami in the right superior pubic ramus. Right sacral ala fracture is better visualized on the prior CT. Electronically Signed   By: Harrietta Sherry M.D.   On: 02/14/2024 12:19   CT L-SPINE NO CHARGE Addendum Date: 02/13/2024 ADDENDUM REPORT: 02/13/2024 04:56 ADDENDUM: A transcription error resulted in replacement of the appropriate dictation for this examination. The findings and impression are as follows: Extensive multi-vessel coronary artery calcification. Mild global cardiomegaly. Small pericardial effusion. Central pulmonary arteries are enlarged in keeping with changes of pulmonary arterial hypertension. 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 Superimposed moderate atherosclerotic calcification of the thoracic aorta. Visualized thyroid  is unremarkable. No pathologic thoracic adenopathy. Esophagus is unremarkable. No mediastinal hematoma. No pneumomediastinum. Small bilateral pleural effusions are present, right greater than left. Mild bibasilar compressive atelectasis. No confluent pulmonary infiltrate. No pneumothorax. No central obstructing lesion. Thoracic dextroscoliosis noted. No acute bone abnormality within the thorax. Liver, gallbladder, pancreas, spleen, and left adrenal glands are unremarkable. 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 Severe left renal cortical  atrophy the right kidney is normal in size and position. Multiple simple and mildly complex cortical cysts are seen within the left kidney for which no follow-up imaging is recommended. No hydronephrosis. No intrarenal or ureteral calculi. Streak artifact limits evaluation of the bladder. The bladder, however, appears distended and is markedly thick walled suggesting superimposed diffuse infectious or inflammatory cystitis. Mild distal colonic and cecal diverticulosis. Stomach, small bowel, and large bowel otherwise unremarkable. No free intraperitoneal gas or fluid. Appendix normal. Mild aortoiliac atherosclerotic calcification. No aortic aneurysm. No pathologic adenopathy within the abdomen and pelvis. 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 There are acute fractures of the left superior and inferior pubic rami as well as the right superior pubic ramus. Mild override the left pubic rami fractures is in keeping with a lateral compression type injury. Subacute fracture of the right sacral ala with resorption along the fracture margin. There is infiltration within the space of Retzius surrounding the bladder in keeping with probable extraperitoneal hemorrhage adjacent to the left superior pubic symphyseal fracture. No definite active extravasation identified though imaging is limited by streak artifact. Impression; Acute fractures of the right superior and left superior and inferior pubic rami with mild displacement suggested a lateral compression type injury. Adjacent extraperitoneal hemorrhage within the space of Retzius without definite active extravasation identified. Circumferential bladder wall thickening suggestive of a diffuse infectious or inflammatory cystitis. Additional incidental findings as noted above. Electronically Signed   By: Dorethia Molt M.D.   On: 02/13/2024 04:56   Result Date: 02/13/2024 CLINICAL DATA:  Fall, low back pain EXAM: CT LUMBAR SPINE WITHOUT CONTRAST TECHNIQUE:  Multidetector CT imaging of the lumbar spine was performed without intravenous contrast administration. Multiplanar CT image reconstructions were also generated. RADIATION DOSE REDUCTION: This exam was performed according to the departmental dose-optimization program which includes automated exposure  control, adjustment of the mA and/or kV according to patient size and/or use of iterative reconstruction technique. COMPARISON:  None Available. 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 Alignment: Moderate lumbar levoscoliosis, apex left at L3. Normal lumbar lordosis. No listhesis. Vertebrae: No acute fracture of the lumbar spine; vertebral body height is preserved. No lytic or blastic bone lesion. Acute to subacute fracture of the right sacral ala noted with mild override of the fracture fragments suggestive of a lateral compression type injury. Osseous structures are diffusely osteopenic. Paraspinal and other soft tissues: See accompanying report for CT examination of the chest, abdomen, and pelvis. No paraspinal fluid collection or inflammatory change identified. Disc levels: Intervertebral disc space narrowing and endplate remodeling at L3-S1 is present in keeping with changes of advanced degenerative disc disease. Axial images demonstrate: L1-2: Unremarkable L2-3: Mild broad-based disc bulge and mild bilateral laminar hypertrophy result in mild central canal stenosis. No significant neuroforaminal narrowing. Mild bilateral facet arthrosis. L3-4: Posterior disc osteophyte complex. Mild bilateral facet arthrosis with associated hypertrophy. Resultant moderate central canal stenosis within the sub foraminal zone. No significant neuroforaminal narrowing. Impingement the right lateral recess with possible impingement of the crossing right L4 nerve root L4-5: Broad disc osteophyte complex, advanced bilateral facet arthrosis, associated facet hypertrophy, and laminar  hypertrophy result in severe central canal stenosis within the sub foraminal zone. Mild right and moderate left neuroforaminal narrowing. Impingement of the right lateral recess probable impingement of the crossing right L5 nerve root. Possible impingement the crossing left L5 nerve root. L5-S1: Bilateral facet arthrosis, severe on the left. No significant neuroforaminal narrowing or canal stenosis. IMPRESSION: 1. Acute to subacute fracture of the right sacral ala with mild override of the fracture fragments suggestive of a lateral compression type injury. 2. No acute fracture or malalignment of the lumbar spine. 3. 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 4. Impingement of the right lateral recesses at L3-4 and L4-5 with possible impingement of the crossing right L4 and L5 nerve roots. 5. Multilevel neuroforaminal narrowing, moderate on the left at L4-5. 6. 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 7. 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 Electronically Signed: By: Dorethia Molt M.D. On: 02/13/2024 04:26   CT T-SPINE NO CHARGE Addendum Date: 02/13/2024 ADDENDUM REPORT: 02/13/2024 04:56 ADDENDUM: A transcription error resulted in replacement of the appropriate dictation for this examination. The findings and impression are as follows: Extensive multi-vessel coronary artery calcification. Mild global cardiomegaly. Small pericardial effusion. Central pulmonary arteries are enlarged in keeping with changes of pulmonary arterial hypertension. 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 Superimposed moderate atherosclerotic calcification of the thoracic aorta. Visualized thyroid  is unremarkable. No pathologic thoracic adenopathy. Esophagus is unremarkable. No mediastinal hematoma.  No pneumomediastinum. Small bilateral pleural effusions are present, right greater than left. Mild bibasilar compressive atelectasis. No confluent pulmonary infiltrate. No pneumothorax. No central obstructing lesion. Thoracic dextroscoliosis noted. No acute bone abnormality within the thorax. Liver, gallbladder, pancreas, spleen, and left adrenal glands are unremarkable. 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 Severe left renal cortical atrophy the right kidney is normal in size and position. Multiple simple and mildly complex cortical cysts are seen within the  left kidney for which no follow-up imaging is recommended. No hydronephrosis. No intrarenal or ureteral calculi. Streak artifact limits evaluation of the bladder. The bladder, however, appears distended and is markedly thick walled suggesting superimposed diffuse infectious or inflammatory cystitis. Mild distal colonic and cecal diverticulosis. Stomach, small bowel, and large bowel otherwise unremarkable. No free intraperitoneal gas or fluid. Appendix normal. Mild aortoiliac atherosclerotic calcification. No aortic aneurysm. No pathologic adenopathy within the abdomen and pelvis. 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 There are acute fractures of the left superior and inferior pubic rami as well as the right superior pubic ramus. Mild override the left pubic rami fractures is in keeping with a lateral compression type injury. Subacute fracture of the right sacral ala with resorption along the fracture margin. There is infiltration within the space of Retzius surrounding the bladder in keeping with probable extraperitoneal hemorrhage adjacent to the left superior pubic symphyseal fracture. No definite active extravasation identified though imaging is limited by streak artifact. Impression; Acute fractures of the right superior  and left superior and inferior pubic rami with mild displacement suggested a lateral compression type injury. Adjacent extraperitoneal hemorrhage within the space of Retzius without definite active extravasation identified. Circumferential bladder wall thickening suggestive of a diffuse infectious or inflammatory cystitis. Additional incidental findings as noted above. Electronically Signed   By: Dorethia Molt M.D.   On: 02/13/2024 04:56   Result Date: 02/13/2024 CLINICAL DATA:  Fall, low back pain EXAM: CT LUMBAR SPINE WITHOUT CONTRAST TECHNIQUE: Multidetector CT imaging of the lumbar spine was performed without intravenous contrast administration. Multiplanar CT image reconstructions were also generated. RADIATION DOSE REDUCTION: This exam was performed according to the departmental dose-optimization program which includes automated exposure control, adjustment of the mA and/or kV according to patient size and/or use of iterative reconstruction technique. COMPARISON:  None Available. 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 Alignment: Moderate lumbar levoscoliosis, apex left at L3. Normal lumbar lordosis. No listhesis. Vertebrae: No acute fracture of the lumbar spine; vertebral body height is preserved. No lytic or blastic bone lesion. Acute to subacute fracture of the right sacral ala noted with mild override of the fracture fragments suggestive of a lateral compression type injury. Osseous structures are diffusely osteopenic. Paraspinal and other soft tissues: See accompanying report for CT examination of the chest, abdomen, and pelvis. No paraspinal fluid collection or inflammatory change identified. Disc levels: Intervertebral disc space narrowing and endplate remodeling at L3-S1 is present in keeping with changes of advanced degenerative disc disease. Axial images demonstrate: L1-2: Unremarkable L2-3: Mild broad-based disc bulge and mild bilateral laminar  hypertrophy result in mild central canal stenosis. No significant neuroforaminal narrowing. Mild bilateral facet arthrosis. L3-4: Posterior disc osteophyte complex. Mild bilateral facet arthrosis with associated hypertrophy. Resultant moderate central canal stenosis within the sub foraminal zone. No significant neuroforaminal narrowing. Impingement the right lateral recess with possible impingement of the crossing right L4 nerve root L4-5: Broad disc osteophyte complex, advanced bilateral facet arthrosis, associated facet hypertrophy, and laminar hypertrophy result in severe central canal stenosis within the sub foraminal zone. Mild right and moderate left neuroforaminal narrowing. Impingement of the right lateral recess probable impingement of the crossing right L5 nerve root. Possible impingement the crossing left L5 nerve root. L5-S1: Bilateral facet arthrosis, severe on the left. No significant neuroforaminal narrowing or canal stenosis. IMPRESSION: 1. Acute to subacute fracture of the right sacral ala with mild override of the fracture fragments suggestive of a lateral compression type  injury. 2. No acute fracture or malalignment of the lumbar spine. 3. 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 4. Impingement of the right lateral recesses at L3-4 and L4-5 with possible impingement of the crossing right L4 and L5 nerve roots. 5. Multilevel neuroforaminal narrowing, moderate on the left at L4-5. 6. 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 7. 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 Electronically Signed: By: Dorethia Molt M.D. On: 02/13/2024 04:26   CT CHEST ABDOMEN PELVIS W CONTRAST Addendum Date: 02/13/2024 ADDENDUM REPORT: 02/13/2024 04:56 ADDENDUM: A transcription error resulted in replacement of the appropriate dictation for this examination. The findings and impression are as  follows: Extensive multi-vessel coronary artery calcification. Mild global cardiomegaly. Small pericardial effusion. Central pulmonary arteries are enlarged in keeping with changes of pulmonary arterial hypertension. 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 Superimposed moderate atherosclerotic calcification of the thoracic aorta. Visualized thyroid  is unremarkable. No pathologic thoracic adenopathy. Esophagus is unremarkable. No mediastinal hematoma. No pneumomediastinum. Small bilateral pleural effusions are present, right greater than left. Mild bibasilar compressive atelectasis. No confluent pulmonary infiltrate. No pneumothorax. No central obstructing lesion. Thoracic dextroscoliosis noted. No acute bone abnormality within the thorax. Liver, gallbladder, pancreas, spleen, and left adrenal glands are unremarkable. 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 Severe left renal cortical atrophy the right kidney is normal in size and position. Multiple simple and mildly complex cortical cysts are seen within the left kidney for which no follow-up imaging is recommended. No hydronephrosis. No intrarenal or ureteral calculi. Streak artifact limits evaluation of the bladder. The bladder, however, appears distended and is markedly thick walled suggesting superimposed diffuse infectious or inflammatory cystitis. Mild distal colonic and cecal diverticulosis. Stomach, small bowel, and large bowel otherwise unremarkable. No free intraperitoneal gas or fluid. Appendix normal. Mild aortoiliac atherosclerotic calcification. No aortic aneurysm. No pathologic adenopathy within the abdomen and pelvis. 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 There are acute fractures of the left superior and  inferior pubic rami as well as the right superior pubic ramus. Mild override the left pubic rami fractures is in keeping with a lateral compression type injury. Subacute fracture of the right sacral ala with resorption along the fracture margin. There is infiltration within the space of Retzius surrounding the bladder in keeping with probable extraperitoneal hemorrhage adjacent to the left superior pubic symphyseal fracture. No definite active extravasation identified though imaging is limited by streak artifact. Impression; Acute fractures of the right superior and left superior and inferior pubic rami with mild displacement suggested a lateral compression type injury. Adjacent extraperitoneal hemorrhage within the space of Retzius without definite active extravasation identified. Circumferential bladder wall thickening suggestive of a diffuse infectious or inflammatory cystitis. Additional incidental findings as noted above. Electronically Signed   By: Dorethia Molt M.D.   On: 02/13/2024 04:56   Result Date: 02/13/2024 CLINICAL DATA:  Fall, low back pain EXAM: CT LUMBAR SPINE WITHOUT CONTRAST TECHNIQUE: Multidetector CT imaging of the lumbar spine was performed without intravenous contrast administration. Multiplanar CT image reconstructions were also generated. RADIATION DOSE REDUCTION: This exam was performed according to the departmental dose-optimization program which includes automated exposure control, adjustment of the mA and/or kV according  to patient size and/or use of iterative reconstruction technique. COMPARISON:  None Available. 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 Alignment: Moderate lumbar levoscoliosis, apex left at L3. Normal lumbar lordosis. No listhesis. Vertebrae: No acute fracture of the lumbar spine; vertebral body height is preserved. No lytic or blastic bone lesion. Acute to subacute fracture of the right sacral ala noted with mild  override of the fracture fragments suggestive of a lateral compression type injury. Osseous structures are diffusely osteopenic. Paraspinal and other soft tissues: See accompanying report for CT examination of the chest, abdomen, and pelvis. No paraspinal fluid collection or inflammatory change identified. Disc levels: Intervertebral disc space narrowing and endplate remodeling at L3-S1 is present in keeping with changes of advanced degenerative disc disease. Axial images demonstrate: L1-2: Unremarkable L2-3: Mild broad-based disc bulge and mild bilateral laminar hypertrophy result in mild central canal stenosis. No significant neuroforaminal narrowing. Mild bilateral facet arthrosis. L3-4: Posterior disc osteophyte complex. Mild bilateral facet arthrosis with associated hypertrophy. Resultant moderate central canal stenosis within the sub foraminal zone. No significant neuroforaminal narrowing. Impingement the right lateral recess with possible impingement of the crossing right L4 nerve root L4-5: Broad disc osteophyte complex, advanced bilateral facet arthrosis, associated facet hypertrophy, and laminar hypertrophy result in severe central canal stenosis within the sub foraminal zone. Mild right and moderate left neuroforaminal narrowing. Impingement of the right lateral recess probable impingement of the crossing right L5 nerve root. Possible impingement the crossing left L5 nerve root. L5-S1: Bilateral facet arthrosis, severe on the left. No significant neuroforaminal narrowing or canal stenosis. IMPRESSION: 1. Acute to subacute fracture of the right sacral ala with mild override of the fracture fragments suggestive of a lateral compression type injury. 2. No acute fracture or malalignment of the lumbar spine. 3. 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 4. Impingement of the right lateral recesses at L3-4 and  L4-5 with possible impingement of the crossing right L4 and L5 nerve roots. 5. Multilevel neuroforaminal narrowing, moderate on the left at L4-5. 6. 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 7. 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 Electronically Signed: By: Dorethia Molt M.D. On: 02/13/2024 04:26   CT Cervical Spine Wo Contrast Result Date: 02/13/2024 EXAM: CT CERVICAL SPINE WITHOUT CONTRAST 02/13/2024 03:58:59 AM TECHNIQUE: CT of the cervical spine was performed without the administration of intravenous contrast. Multiplanar reformatted images are provided for review. Automated exposure control, iterative reconstruction, and/or weight based adjustment of the mA/kV was utilized to reduce the radiation dose to as low as reasonably achievable. COMPARISON: None available. CLINICAL HISTORY: Neck trauma (Age >= 65y). No contrast. Lower back pain from a mechanical fall this afternoon. Didn't hit his head. No LOC. Takes eliquis . FINDINGS: CERVICAL SPINE: BONES AND ALIGNMENT: Reversal of the normal cervical lordosis is stable. Grade 1 anterolisthesis at C3-4 is stable. DEGENERATIVE CHANGES: Endplate degenerative changes and uncovertebral spurring results in moderate foraminal stenosis bilaterally at C4-5 and C5-6 and on the right at C6-7. SOFT TISSUES: No prevertebral soft tissue swelling. VASCULATURE: Atherosclerotic changes are present with carotid bifurcations bilaterally without definite stenosis. PLEURAL SPACES: Right pleural effusion and chronic right pleural calcifications are again noted. IMPRESSION: 1. No acute abnormality of the cervical spine related to the reported neck trauma. 2. Stable reversal of the normal cervical lordosis and grade 1 anterolisthesis at C3-4. 3. Moderate foraminal stenosis bilaterally at C4-5 and C5-6  and on the right at C6-7 due to endplate degenerative changes and uncovertebral spurring. Electronically signed by: Lonni Necessary MD 02/13/2024 04:19  AM EDT RP Workstation: [id number]   CT Head Wo Contrast Result Date: 02/13/2024 EXAM: CT HEAD WITHOUT CONTRAST 02/13/2024 03:58:59 AM TECHNIQUE: CT of the head was performed without the administration of intravenous contrast. Automated exposure control, iterative reconstruction, and/or weight based adjustment of the mA/kV was utilized to reduce the radiation dose to as low as reasonably achievable. COMPARISON: CT head without contrast 11/18/2023. CLINICAL HISTORY: Head trauma, moderate-severe. No contrast. Lower back pain from a mechanical fall this afternoon. Didn't hit his head. No LOC. Takes eliquis . FINDINGS: BRAIN AND VENTRICLES: No acute hemorrhage. Gray-white differentiation is preserved. No hydrocephalus. No extra-axial collection. No mass effect or midline shift. Mild atrophy and white matter changes are similar to prior study. ORBITS: No acute abnormality. SINUSES: No acute abnormality. SOFT TISSUES AND SKULL: No acute soft tissue abnormality. No skull fracture. VASCULATURE: Atherosclerotic calcifications are present in the cavernous carotid arteries bilaterally and at the dural margin of both vertebral arteries. No hyperdense vessel is present. IMPRESSION: 1. No acute intracranial abnormality. 2. Mild atrophy and white matter changes, similar to prior study. 3. Atherosclerosis Electronically signed by: Lonni Necessary MD 02/13/2024 04:16 AM EDT RP Workstation: [id number]   DG Chest Portable 1 View Result Date: 02/13/2024 CLINICAL DATA:  Recent fall with chest pain, initial encounter EXAM: PORTABLE CHEST 1 VIEW COMPARISON:  11/18/2023 FINDINGS: Cardiac shadow is enlarged. Aortic calcifications are noted. The lungs are well aerated without focal infiltrate. Chronic scarring in the right base is seen. No pneumothorax is noted. No acute bony abnormality is seen. IMPRESSION: Chronic changes without acute abnormality. Electronically Signed   By: Oneil Devonshire M.D.   On: 02/13/2024 00:08   DG Elbow  Complete Right Result Date: 02/13/2024 CLINICAL DATA:  Recent fall with right elbow pain, initial encounter EXAM: RIGHT ELBOW - COMPLETE 3+ VIEW COMPARISON:  None Available. FINDINGS: There is no evidence of fracture, dislocation, or joint effusion. There is no evidence of arthropathy or other focal bone abnormality. Soft tissues are unremarkable. IMPRESSION: No acute abnormality noted. Electronically Signed   By: Oneil Devonshire M.D.   On: 02/13/2024 00:08   DG Knee Right Port Result Date: 02/13/2024 CLINICAL DATA:  Recent fall with right knee pain, initial encounter EXAM: PORTABLE RIGHT KNEE - 2 VIEW COMPARISON:  08/31/2023 FINDINGS: Degenerative changes are noted in the medial joint space. Diffuse soft tissue swelling is noted. No acute fracture or dislocation is identified. No joint effusion is seen. IMPRESSION: Degenerative change without acute abnormality. Electronically Signed   By: Oneil Devonshire M.D.   On: 02/13/2024 00:07   DG Knee Left Port Result Date: 02/13/2024 CLINICAL DATA:  Recent fall with knee pain, initial encounter EXAM: PORTABLE LEFT KNEE - 2 VIEW COMPARISON:  None Available. 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 No acute fracture or dislocation is noted. No joint effusion is seen. Diffuse vascular calcifications are noted. IMPRESSION: No acute abnormality noted. Electronically Signed   By: Oneil Devonshire M.D.   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 Lucency is noted over the superior pubic rami bilaterally suspicious for undisplaced fractures. CT would be helpful for further evaluation. No other bony abnormality is seen. IMPRESSION: Findings suspicious for bilateral superior pubic rami  fractures. CT would be helpful in this regard. Electronically Signed   By: Oneil Devonshire M.D.   On:  02/13/2024 00:06    Micro Results    No results found for this or any previous visit (from the past 240 hours).  Today   Subjective    Ryane Canavan today has no headache,no chest abdominal pain,no new weakness tingling or numbness, feels much better wants to go home today.    Objective   Blood pressure (!) 146/86, pulse 82, temperature 98.5 F (36.9 C), temperature source Oral, resp. rate 17, height 6' 2 (1.88 m), weight 79.6 kg, SpO2 96%.   Intake/Output Summary (Last 24 hours) at 02/20/2024 0921 Last data filed at 02/20/2024 0806 Gross per 24 hour  Intake 120 ml  Output 1700 ml  Net -1580 ml    Exam  Awake, minimally confused, No new F.N deficits,    [place].AT,PERRAL Supple Neck,   Symmetrical Chest wall movement, Good air movement bilaterally, CTAB RRR,No Gallops,   +ve B.Sounds, Abd Soft, Non tender,  No Cyanosis, Clubbing or edema    Data Review   Recent Labs  Lab 02/16/24 0512 02/17/24 0603 02/18/24 0715 02/19/24 0701 02/20/24 0610  WBC 8.5 7.9 7.0 7.2 8.0  HGB 8.1* 8.4* 7.9* 8.8* 8.8*  HCT 26.7* 27.1* 27.0* 29.5* 29.6*  PLT 143* 170 139* 200 248  MCV 85.0 83.6 87.1 86.5 86.3  MCH 25.8* 25.9* 25.5* 25.8* 25.7*  MCHC 30.3 31.0 29.3* 29.8* 29.7*  RDW 18.9* 18.8* 19.3* 19.8* 20.0*  LYMPHSABS 0.4* 0.6* 0.7 0.9 0.8  MONOABS 0.5 0.5 0.5 0.5 0.7  EOSABS 0.2 0.1 0.1 0.2 0.2  BASOSABS 0.0 0.0 0.0 0.0 0.0    Recent Labs  Lab 02/14/24 0649 02/15/24 0527 02/15/24 0528 02/16/24 0512 02/17/24 0603 02/18/24 0715 02/19/24 0701  NA 140  --  143 140 143 143 145  K 4.4  --  3.8 4.2 3.6 4.1 4.0  CL 108  --  112* 112* 111 111 109  CO2 21*  --  25 21* 25 22 26   ANIONGAP 11  --  6 7 7 10 10   GLUCOSE 103*  --  101* 111* 140* 100* 92  BUN 19  --  18 20 20 20 19   CREATININE 1.01  --  0.98 0.92 1.12 0.93 0.81  TSH  --  0.849  --   --   --   --   --   MG 2.1  --  2.2 2.1 2.0  --   --   CALCIUM  8.0*  --  8.1* 7.9* 8.1* 8.1* 8.3*    Total Time in preparing paper  work, data evaluation and todays exam - 35 minutes  Signature  -    Lavada Stank M.D on 02/20/2024 at 9:21 AM   -  To page go to [email or web address]

## 2024-02-20 NOTE — Plan of Care (Signed)
  Problem: Education: Goal: Knowledge of General Education information will improve Description: Including pain rating scale, medication(s)/side effects and non-pharmacologic comfort measures Outcome: Adequate for Discharge   Problem: Health Behavior/Discharge Planning: Goal: Ability to manage health-related needs will improve Outcome: Adequate for Discharge   Problem: Clinical Measurements: Goal: Ability to maintain clinical measurements within normal limits will improve Outcome: Adequate for Discharge Goal: Will remain free from infection Outcome: Adequate for Discharge Goal: Diagnostic test results will improve Outcome: Adequate for Discharge Goal: Respiratory complications will improve Outcome: Adequate for Discharge Goal: Cardiovascular complication will be avoided Outcome: Adequate for Discharge   Problem: Activity: Goal: Risk for activity intolerance will decrease Outcome: Adequate for Discharge   Problem: Nutrition: Goal: Adequate nutrition will be maintained Outcome: Adequate for Discharge   Problem: Coping: Goal: Level of anxiety will decrease Outcome: Adequate for Discharge   Problem: Elimination: Goal: Will not experience complications related to bowel motility Outcome: Adequate for Discharge Goal: Will not experience complications related to urinary retention Outcome: Adequate for Discharge   Problem: Pain Managment: Goal: General experience of comfort will improve and/or be controlled Outcome: Adequate for Discharge   Problem: Safety: Goal: Ability to remain free from injury will improve Outcome: Adequate for Discharge   Problem: Skin Integrity: Goal: Risk for impaired skin integrity will decrease Outcome: Adequate for Discharge  Discharging to skilled nursing facility with rehab

## 2024-02-20 NOTE — Discharge Instructions (Signed)
 Follow with Primary MD Rexanne Ingle, MD in 7 days   Get CBC, CMP, Magnesium , 2 view Chest X ray -  checked by SNF MD in 3 to 4 days.  Activity: As tolerated with Full fall precautions use walker/cane & assistance as needed  Disposition Home   Diet: Heart Healthy with 1.5 L fluid restriction.  Feeding assistance and aspiration precautions.  Special Instructions: If you have smoked or chewed Tobacco  in the last 2 yrs please stop smoking, stop any regular Alcohol  and or any Recreational drug use.  On your next visit with your primary care physician please Get Medicines reviewed and adjusted.  Please request your Prim.MD to go over all Hospital Tests and Procedure/Radiological results at the follow up, please get all Hospital records sent to your Prim MD by signing hospital release before you go home.  If you experience worsening of your admission symptoms, develop shortness of breath, life threatening emergency, suicidal or homicidal thoughts you must seek medical attention immediately by calling 911 or calling your MD immediately  if symptoms less severe.  You Must read complete instructions/literature along with all the possible adverse reactions/side effects for all the Medicines you take and that have been prescribed to you. Take any new Medicines after you have completely understood and accpet all the possible adverse reactions/side effects.   Do not drive when taking Pain medications.  Do not take more than prescribed Pain, Sleep and Anxiety Medications  Wear Seat belts while driving.

## 2024-02-21 DIAGNOSIS — I10 Essential (primary) hypertension: Secondary | ICD-10-CM | POA: Diagnosis not present

## 2024-02-21 DIAGNOSIS — R5381 Other malaise: Secondary | ICD-10-CM | POA: Diagnosis not present

## 2024-02-21 DIAGNOSIS — E612 Magnesium deficiency: Secondary | ICD-10-CM | POA: Diagnosis not present

## 2024-02-28 NOTE — Telephone Encounter (Signed)
 Daughter Elray) called to report patient fell and broke his hip and is rehab and his surgery has been canceled.  Daughter called to cancel tele-visit on 8/18.

## 2024-02-28 NOTE — Telephone Encounter (Addendum)
 patient's daughter called to cancel due to patient had a fall and surgery has been canceled will update his chart and take him out of our pre-op pool.

## 2024-02-29 DIAGNOSIS — R5381 Other malaise: Secondary | ICD-10-CM | POA: Diagnosis not present

## 2024-03-06 ENCOUNTER — Ambulatory Visit

## 2024-03-07 DIAGNOSIS — R5381 Other malaise: Secondary | ICD-10-CM | POA: Diagnosis not present

## 2024-03-13 DIAGNOSIS — I5032 Chronic diastolic (congestive) heart failure: Secondary | ICD-10-CM | POA: Diagnosis not present

## 2024-03-13 DIAGNOSIS — D649 Anemia, unspecified: Secondary | ICD-10-CM | POA: Diagnosis not present

## 2024-03-13 DIAGNOSIS — J9601 Acute respiratory failure with hypoxia: Secondary | ICD-10-CM | POA: Diagnosis not present

## 2024-03-13 DIAGNOSIS — I2510 Atherosclerotic heart disease of native coronary artery without angina pectoris: Secondary | ICD-10-CM | POA: Diagnosis not present

## 2024-03-13 DIAGNOSIS — R4189 Other symptoms and signs involving cognitive functions and awareness: Secondary | ICD-10-CM | POA: Diagnosis not present

## 2024-03-13 DIAGNOSIS — I4811 Longstanding persistent atrial fibrillation: Secondary | ICD-10-CM | POA: Diagnosis not present

## 2024-03-13 DIAGNOSIS — R5381 Other malaise: Secondary | ICD-10-CM | POA: Diagnosis not present

## 2024-03-13 DIAGNOSIS — W19XXXD Unspecified fall, subsequent encounter: Secondary | ICD-10-CM | POA: Diagnosis not present

## 2024-03-15 ENCOUNTER — Ambulatory Visit: Admitting: Physician Assistant

## 2024-03-20 DIAGNOSIS — R2689 Other abnormalities of gait and mobility: Secondary | ICD-10-CM | POA: Diagnosis not present

## 2024-03-21 DIAGNOSIS — M6281 Muscle weakness (generalized): Secondary | ICD-10-CM | POA: Diagnosis not present

## 2024-03-22 ENCOUNTER — Other Ambulatory Visit: Payer: Self-pay

## 2024-03-22 ENCOUNTER — Telehealth: Payer: Self-pay | Admitting: Cardiology

## 2024-03-22 DIAGNOSIS — I4819 Other persistent atrial fibrillation: Secondary | ICD-10-CM

## 2024-03-22 MED ORDER — APIXABAN 2.5 MG PO TABS: 2.5000 mg | ORAL_TABLET | Freq: Two times a day (BID) | ORAL | 1 refills | Status: AC

## 2024-03-22 NOTE — Telephone Encounter (Signed)
 Prescription refill request for Eliquis  received. Indication:afib Last office visit:4/25 Scr:0.86  8/25 Age: 83 Weight:79.6  kg  Prescription refilled

## 2024-03-22 NOTE — Telephone Encounter (Signed)
*  STAT* If patient is at the pharmacy, call can be transferred to refill team.   1. Which medications need to be refilled? (please list name of each medication and dose if known) apixaban  (ELIQUIS ) 2.5 MG TABS tablet    4. Which pharmacy/location (including street and city if local pharmacy) is medication to be sent to?   [id number] [place], [place] - [address] AT [facility] [address] [facility] [address] [facility] [place] Phone: [phone]  Fax: [phone]     5. Do they need a 30 day or 90 day supply? 90

## 2024-03-23 ENCOUNTER — Other Ambulatory Visit: Admitting: Vascular Surgery

## 2024-03-23 DIAGNOSIS — R2689 Other abnormalities of gait and mobility: Secondary | ICD-10-CM | POA: Diagnosis not present

## 2024-03-27 DIAGNOSIS — I495 Sick sinus syndrome: Secondary | ICD-10-CM | POA: Diagnosis not present

## 2024-03-27 DIAGNOSIS — S81802D Unspecified open wound, left lower leg, subsequent encounter: Secondary | ICD-10-CM | POA: Diagnosis not present

## 2024-03-27 DIAGNOSIS — I5032 Chronic diastolic (congestive) heart failure: Secondary | ICD-10-CM | POA: Diagnosis not present

## 2024-03-27 DIAGNOSIS — D500 Iron deficiency anemia secondary to blood loss (chronic): Secondary | ICD-10-CM | POA: Diagnosis not present

## 2024-03-27 DIAGNOSIS — S32591D Other specified fracture of right pubis, subsequent encounter for fracture with routine healing: Secondary | ICD-10-CM | POA: Diagnosis not present

## 2024-03-27 DIAGNOSIS — S32592D Other specified fracture of left pubis, subsequent encounter for fracture with routine healing: Secondary | ICD-10-CM | POA: Diagnosis not present

## 2024-03-27 DIAGNOSIS — I110 Hypertensive heart disease with heart failure: Secondary | ICD-10-CM | POA: Diagnosis not present

## 2024-03-27 DIAGNOSIS — I2510 Atherosclerotic heart disease of native coronary artery without angina pectoris: Secondary | ICD-10-CM | POA: Diagnosis not present

## 2024-03-27 DIAGNOSIS — I4811 Longstanding persistent atrial fibrillation: Secondary | ICD-10-CM | POA: Diagnosis not present

## 2024-03-28 DIAGNOSIS — M6281 Muscle weakness (generalized): Secondary | ICD-10-CM | POA: Diagnosis not present

## 2024-03-29 DIAGNOSIS — S81802D Unspecified open wound, left lower leg, subsequent encounter: Secondary | ICD-10-CM | POA: Diagnosis not present

## 2024-03-29 DIAGNOSIS — I4811 Longstanding persistent atrial fibrillation: Secondary | ICD-10-CM | POA: Diagnosis not present

## 2024-03-29 DIAGNOSIS — I2510 Atherosclerotic heart disease of native coronary artery without angina pectoris: Secondary | ICD-10-CM | POA: Diagnosis not present

## 2024-03-29 DIAGNOSIS — I495 Sick sinus syndrome: Secondary | ICD-10-CM | POA: Diagnosis not present

## 2024-03-29 DIAGNOSIS — I110 Hypertensive heart disease with heart failure: Secondary | ICD-10-CM | POA: Diagnosis not present

## 2024-03-29 DIAGNOSIS — S32591D Other specified fracture of right pubis, subsequent encounter for fracture with routine healing: Secondary | ICD-10-CM | POA: Diagnosis not present

## 2024-03-29 DIAGNOSIS — S32592D Other specified fracture of left pubis, subsequent encounter for fracture with routine healing: Secondary | ICD-10-CM | POA: Diagnosis not present

## 2024-03-29 DIAGNOSIS — I5032 Chronic diastolic (congestive) heart failure: Secondary | ICD-10-CM | POA: Diagnosis not present

## 2024-03-29 DIAGNOSIS — D500 Iron deficiency anemia secondary to blood loss (chronic): Secondary | ICD-10-CM | POA: Diagnosis not present

## 2024-03-31 DIAGNOSIS — D500 Iron deficiency anemia secondary to blood loss (chronic): Secondary | ICD-10-CM | POA: Diagnosis not present

## 2024-03-31 DIAGNOSIS — S32592D Other specified fracture of left pubis, subsequent encounter for fracture with routine healing: Secondary | ICD-10-CM | POA: Diagnosis not present

## 2024-03-31 DIAGNOSIS — I4811 Longstanding persistent atrial fibrillation: Secondary | ICD-10-CM | POA: Diagnosis not present

## 2024-03-31 DIAGNOSIS — I5032 Chronic diastolic (congestive) heart failure: Secondary | ICD-10-CM | POA: Diagnosis not present

## 2024-03-31 DIAGNOSIS — S32591D Other specified fracture of right pubis, subsequent encounter for fracture with routine healing: Secondary | ICD-10-CM | POA: Diagnosis not present

## 2024-03-31 DIAGNOSIS — I2510 Atherosclerotic heart disease of native coronary artery without angina pectoris: Secondary | ICD-10-CM | POA: Diagnosis not present

## 2024-03-31 DIAGNOSIS — I495 Sick sinus syndrome: Secondary | ICD-10-CM | POA: Diagnosis not present

## 2024-03-31 DIAGNOSIS — I110 Hypertensive heart disease with heart failure: Secondary | ICD-10-CM | POA: Diagnosis not present

## 2024-03-31 DIAGNOSIS — S81802D Unspecified open wound, left lower leg, subsequent encounter: Secondary | ICD-10-CM | POA: Diagnosis not present

## 2024-04-04 DIAGNOSIS — D500 Iron deficiency anemia secondary to blood loss (chronic): Secondary | ICD-10-CM | POA: Diagnosis not present

## 2024-04-04 DIAGNOSIS — S81802D Unspecified open wound, left lower leg, subsequent encounter: Secondary | ICD-10-CM | POA: Diagnosis not present

## 2024-04-04 DIAGNOSIS — S32592D Other specified fracture of left pubis, subsequent encounter for fracture with routine healing: Secondary | ICD-10-CM | POA: Diagnosis not present

## 2024-04-04 DIAGNOSIS — I110 Hypertensive heart disease with heart failure: Secondary | ICD-10-CM | POA: Diagnosis not present

## 2024-04-04 DIAGNOSIS — I2510 Atherosclerotic heart disease of native coronary artery without angina pectoris: Secondary | ICD-10-CM | POA: Diagnosis not present

## 2024-04-04 DIAGNOSIS — S32591D Other specified fracture of right pubis, subsequent encounter for fracture with routine healing: Secondary | ICD-10-CM | POA: Diagnosis not present

## 2024-04-04 DIAGNOSIS — I5032 Chronic diastolic (congestive) heart failure: Secondary | ICD-10-CM | POA: Diagnosis not present

## 2024-04-04 DIAGNOSIS — I4811 Longstanding persistent atrial fibrillation: Secondary | ICD-10-CM | POA: Diagnosis not present

## 2024-04-04 DIAGNOSIS — I495 Sick sinus syndrome: Secondary | ICD-10-CM | POA: Diagnosis not present

## 2024-04-06 ENCOUNTER — Encounter (HOSPITAL_COMMUNITY)

## 2024-04-06 ENCOUNTER — Encounter: Admitting: Vascular Surgery

## 2024-04-07 DIAGNOSIS — I2510 Atherosclerotic heart disease of native coronary artery without angina pectoris: Secondary | ICD-10-CM | POA: Diagnosis not present

## 2024-04-07 DIAGNOSIS — I110 Hypertensive heart disease with heart failure: Secondary | ICD-10-CM | POA: Diagnosis not present

## 2024-04-07 DIAGNOSIS — S32592D Other specified fracture of left pubis, subsequent encounter for fracture with routine healing: Secondary | ICD-10-CM | POA: Diagnosis not present

## 2024-04-07 DIAGNOSIS — D500 Iron deficiency anemia secondary to blood loss (chronic): Secondary | ICD-10-CM | POA: Diagnosis not present

## 2024-04-07 DIAGNOSIS — S81802D Unspecified open wound, left lower leg, subsequent encounter: Secondary | ICD-10-CM | POA: Diagnosis not present

## 2024-04-07 DIAGNOSIS — I495 Sick sinus syndrome: Secondary | ICD-10-CM | POA: Diagnosis not present

## 2024-04-07 DIAGNOSIS — I5032 Chronic diastolic (congestive) heart failure: Secondary | ICD-10-CM | POA: Diagnosis not present

## 2024-04-07 DIAGNOSIS — S32591D Other specified fracture of right pubis, subsequent encounter for fracture with routine healing: Secondary | ICD-10-CM | POA: Diagnosis not present

## 2024-04-07 DIAGNOSIS — I4811 Longstanding persistent atrial fibrillation: Secondary | ICD-10-CM | POA: Diagnosis not present

## 2024-04-10 DIAGNOSIS — S32591D Other specified fracture of right pubis, subsequent encounter for fracture with routine healing: Secondary | ICD-10-CM | POA: Diagnosis not present

## 2024-04-10 DIAGNOSIS — I495 Sick sinus syndrome: Secondary | ICD-10-CM | POA: Diagnosis not present

## 2024-04-11 DIAGNOSIS — D500 Iron deficiency anemia secondary to blood loss (chronic): Secondary | ICD-10-CM | POA: Diagnosis not present

## 2024-04-11 DIAGNOSIS — I4811 Longstanding persistent atrial fibrillation: Secondary | ICD-10-CM | POA: Diagnosis not present

## 2024-04-11 DIAGNOSIS — I110 Hypertensive heart disease with heart failure: Secondary | ICD-10-CM | POA: Diagnosis not present

## 2024-04-11 DIAGNOSIS — I2510 Atherosclerotic heart disease of native coronary artery without angina pectoris: Secondary | ICD-10-CM | POA: Diagnosis not present

## 2024-04-11 DIAGNOSIS — S32591D Other specified fracture of right pubis, subsequent encounter for fracture with routine healing: Secondary | ICD-10-CM | POA: Diagnosis not present

## 2024-04-11 DIAGNOSIS — I495 Sick sinus syndrome: Secondary | ICD-10-CM | POA: Diagnosis not present

## 2024-04-11 DIAGNOSIS — S32592D Other specified fracture of left pubis, subsequent encounter for fracture with routine healing: Secondary | ICD-10-CM | POA: Diagnosis not present

## 2024-04-11 DIAGNOSIS — I5032 Chronic diastolic (congestive) heart failure: Secondary | ICD-10-CM | POA: Diagnosis not present

## 2024-04-11 DIAGNOSIS — S81802D Unspecified open wound, left lower leg, subsequent encounter: Secondary | ICD-10-CM | POA: Diagnosis not present

## 2024-04-12 ENCOUNTER — Ambulatory Visit: Admitting: Physician Assistant

## 2024-04-13 DIAGNOSIS — S32592D Other specified fracture of left pubis, subsequent encounter for fracture with routine healing: Secondary | ICD-10-CM | POA: Diagnosis not present

## 2024-04-13 DIAGNOSIS — I5032 Chronic diastolic (congestive) heart failure: Secondary | ICD-10-CM | POA: Diagnosis not present

## 2024-04-13 DIAGNOSIS — I4811 Longstanding persistent atrial fibrillation: Secondary | ICD-10-CM | POA: Diagnosis not present

## 2024-04-13 DIAGNOSIS — S32591D Other specified fracture of right pubis, subsequent encounter for fracture with routine healing: Secondary | ICD-10-CM | POA: Diagnosis not present

## 2024-04-13 DIAGNOSIS — D500 Iron deficiency anemia secondary to blood loss (chronic): Secondary | ICD-10-CM | POA: Diagnosis not present

## 2024-04-13 DIAGNOSIS — I495 Sick sinus syndrome: Secondary | ICD-10-CM | POA: Diagnosis not present

## 2024-04-13 DIAGNOSIS — I110 Hypertensive heart disease with heart failure: Secondary | ICD-10-CM | POA: Diagnosis not present

## 2024-04-13 DIAGNOSIS — S81802D Unspecified open wound, left lower leg, subsequent encounter: Secondary | ICD-10-CM | POA: Diagnosis not present

## 2024-04-13 DIAGNOSIS — I2510 Atherosclerotic heart disease of native coronary artery without angina pectoris: Secondary | ICD-10-CM | POA: Diagnosis not present

## 2024-04-18 DIAGNOSIS — I110 Hypertensive heart disease with heart failure: Secondary | ICD-10-CM | POA: Diagnosis not present

## 2024-04-18 DIAGNOSIS — D500 Iron deficiency anemia secondary to blood loss (chronic): Secondary | ICD-10-CM | POA: Diagnosis not present

## 2024-04-18 DIAGNOSIS — I4811 Longstanding persistent atrial fibrillation: Secondary | ICD-10-CM | POA: Diagnosis not present

## 2024-04-18 DIAGNOSIS — S32591D Other specified fracture of right pubis, subsequent encounter for fracture with routine healing: Secondary | ICD-10-CM | POA: Diagnosis not present

## 2024-04-18 DIAGNOSIS — I2510 Atherosclerotic heart disease of native coronary artery without angina pectoris: Secondary | ICD-10-CM | POA: Diagnosis not present

## 2024-04-18 DIAGNOSIS — I495 Sick sinus syndrome: Secondary | ICD-10-CM | POA: Diagnosis not present

## 2024-04-18 DIAGNOSIS — S81802D Unspecified open wound, left lower leg, subsequent encounter: Secondary | ICD-10-CM | POA: Diagnosis not present

## 2024-04-18 DIAGNOSIS — I5032 Chronic diastolic (congestive) heart failure: Secondary | ICD-10-CM | POA: Diagnosis not present

## 2024-04-18 DIAGNOSIS — S32592D Other specified fracture of left pubis, subsequent encounter for fracture with routine healing: Secondary | ICD-10-CM | POA: Diagnosis not present

## 2024-04-20 DIAGNOSIS — S32592D Other specified fracture of left pubis, subsequent encounter for fracture with routine healing: Secondary | ICD-10-CM | POA: Diagnosis not present

## 2024-04-20 DIAGNOSIS — I110 Hypertensive heart disease with heart failure: Secondary | ICD-10-CM | POA: Diagnosis not present

## 2024-04-20 DIAGNOSIS — I495 Sick sinus syndrome: Secondary | ICD-10-CM | POA: Diagnosis not present

## 2024-04-20 DIAGNOSIS — S32591D Other specified fracture of right pubis, subsequent encounter for fracture with routine healing: Secondary | ICD-10-CM | POA: Diagnosis not present

## 2024-04-20 DIAGNOSIS — S81802D Unspecified open wound, left lower leg, subsequent encounter: Secondary | ICD-10-CM | POA: Diagnosis not present

## 2024-04-20 DIAGNOSIS — D500 Iron deficiency anemia secondary to blood loss (chronic): Secondary | ICD-10-CM | POA: Diagnosis not present

## 2024-04-20 DIAGNOSIS — I5032 Chronic diastolic (congestive) heart failure: Secondary | ICD-10-CM | POA: Diagnosis not present

## 2024-04-20 DIAGNOSIS — I2510 Atherosclerotic heart disease of native coronary artery without angina pectoris: Secondary | ICD-10-CM | POA: Diagnosis not present

## 2024-04-20 DIAGNOSIS — I4811 Longstanding persistent atrial fibrillation: Secondary | ICD-10-CM | POA: Diagnosis not present

## 2024-04-27 DIAGNOSIS — I5189 Other ill-defined heart diseases: Secondary | ICD-10-CM | POA: Diagnosis not present

## 2024-04-27 DIAGNOSIS — I4891 Unspecified atrial fibrillation: Secondary | ICD-10-CM | POA: Diagnosis not present

## 2024-04-27 DIAGNOSIS — I2510 Atherosclerotic heart disease of native coronary artery without angina pectoris: Secondary | ICD-10-CM | POA: Diagnosis not present

## 2024-04-27 DIAGNOSIS — I739 Peripheral vascular disease, unspecified: Secondary | ICD-10-CM | POA: Diagnosis not present

## 2024-05-02 DIAGNOSIS — R21 Rash and other nonspecific skin eruption: Secondary | ICD-10-CM | POA: Diagnosis not present

## 2024-05-02 DIAGNOSIS — I739 Peripheral vascular disease, unspecified: Secondary | ICD-10-CM | POA: Diagnosis not present

## 2024-05-02 DIAGNOSIS — I4891 Unspecified atrial fibrillation: Secondary | ICD-10-CM | POA: Diagnosis not present

## 2024-05-02 DIAGNOSIS — I5189 Other ill-defined heart diseases: Secondary | ICD-10-CM | POA: Diagnosis not present

## 2024-05-02 DIAGNOSIS — I2510 Atherosclerotic heart disease of native coronary artery without angina pectoris: Secondary | ICD-10-CM | POA: Diagnosis not present

## 2024-05-02 DIAGNOSIS — S81809A Unspecified open wound, unspecified lower leg, initial encounter: Secondary | ICD-10-CM | POA: Diagnosis not present

## 2024-05-03 DIAGNOSIS — S32592D Other specified fracture of left pubis, subsequent encounter for fracture with routine healing: Secondary | ICD-10-CM | POA: Diagnosis not present

## 2024-05-03 DIAGNOSIS — S32591D Other specified fracture of right pubis, subsequent encounter for fracture with routine healing: Secondary | ICD-10-CM | POA: Diagnosis not present

## 2024-05-03 DIAGNOSIS — I2510 Atherosclerotic heart disease of native coronary artery without angina pectoris: Secondary | ICD-10-CM | POA: Diagnosis not present

## 2024-05-03 DIAGNOSIS — I5032 Chronic diastolic (congestive) heart failure: Secondary | ICD-10-CM | POA: Diagnosis not present

## 2024-05-03 DIAGNOSIS — I110 Hypertensive heart disease with heart failure: Secondary | ICD-10-CM | POA: Diagnosis not present

## 2024-05-03 DIAGNOSIS — I495 Sick sinus syndrome: Secondary | ICD-10-CM | POA: Diagnosis not present

## 2024-05-03 DIAGNOSIS — D500 Iron deficiency anemia secondary to blood loss (chronic): Secondary | ICD-10-CM | POA: Diagnosis not present

## 2024-05-03 DIAGNOSIS — S81802D Unspecified open wound, left lower leg, subsequent encounter: Secondary | ICD-10-CM | POA: Diagnosis not present

## 2024-05-03 DIAGNOSIS — I4811 Longstanding persistent atrial fibrillation: Secondary | ICD-10-CM | POA: Diagnosis not present

## 2024-05-05 DIAGNOSIS — S32591D Other specified fracture of right pubis, subsequent encounter for fracture with routine healing: Secondary | ICD-10-CM | POA: Diagnosis not present

## 2024-05-05 DIAGNOSIS — I495 Sick sinus syndrome: Secondary | ICD-10-CM | POA: Diagnosis not present

## 2024-05-05 DIAGNOSIS — I4811 Longstanding persistent atrial fibrillation: Secondary | ICD-10-CM | POA: Diagnosis not present

## 2024-05-05 DIAGNOSIS — S32592D Other specified fracture of left pubis, subsequent encounter for fracture with routine healing: Secondary | ICD-10-CM | POA: Diagnosis not present

## 2024-05-05 DIAGNOSIS — I5032 Chronic diastolic (congestive) heart failure: Secondary | ICD-10-CM | POA: Diagnosis not present

## 2024-05-05 DIAGNOSIS — D500 Iron deficiency anemia secondary to blood loss (chronic): Secondary | ICD-10-CM | POA: Diagnosis not present

## 2024-05-05 DIAGNOSIS — S81802D Unspecified open wound, left lower leg, subsequent encounter: Secondary | ICD-10-CM | POA: Diagnosis not present

## 2024-05-05 DIAGNOSIS — I110 Hypertensive heart disease with heart failure: Secondary | ICD-10-CM | POA: Diagnosis not present

## 2024-05-08 DIAGNOSIS — S32592D Other specified fracture of left pubis, subsequent encounter for fracture with routine healing: Secondary | ICD-10-CM | POA: Diagnosis not present

## 2024-05-08 DIAGNOSIS — I110 Hypertensive heart disease with heart failure: Secondary | ICD-10-CM | POA: Diagnosis not present

## 2024-05-08 DIAGNOSIS — S81802D Unspecified open wound, left lower leg, subsequent encounter: Secondary | ICD-10-CM | POA: Diagnosis not present

## 2024-05-08 DIAGNOSIS — I495 Sick sinus syndrome: Secondary | ICD-10-CM | POA: Diagnosis not present

## 2024-05-08 DIAGNOSIS — I4811 Longstanding persistent atrial fibrillation: Secondary | ICD-10-CM | POA: Diagnosis not present

## 2024-05-08 DIAGNOSIS — I2510 Atherosclerotic heart disease of native coronary artery without angina pectoris: Secondary | ICD-10-CM | POA: Diagnosis not present

## 2024-05-08 DIAGNOSIS — D500 Iron deficiency anemia secondary to blood loss (chronic): Secondary | ICD-10-CM | POA: Diagnosis not present

## 2024-05-08 DIAGNOSIS — I5032 Chronic diastolic (congestive) heart failure: Secondary | ICD-10-CM | POA: Diagnosis not present

## 2024-05-08 DIAGNOSIS — S32591D Other specified fracture of right pubis, subsequent encounter for fracture with routine healing: Secondary | ICD-10-CM | POA: Diagnosis not present

## 2024-05-09 DIAGNOSIS — S81802D Unspecified open wound, left lower leg, subsequent encounter: Secondary | ICD-10-CM | POA: Diagnosis not present

## 2024-05-11 DIAGNOSIS — E559 Vitamin D deficiency, unspecified: Secondary | ICD-10-CM | POA: Diagnosis not present

## 2024-05-11 DIAGNOSIS — E782 Mixed hyperlipidemia: Secondary | ICD-10-CM | POA: Diagnosis not present

## 2024-05-11 DIAGNOSIS — D519 Vitamin B12 deficiency anemia, unspecified: Secondary | ICD-10-CM | POA: Diagnosis not present

## 2024-05-11 DIAGNOSIS — R7309 Other abnormal glucose: Secondary | ICD-10-CM | POA: Diagnosis not present

## 2024-05-11 DIAGNOSIS — E038 Other specified hypothyroidism: Secondary | ICD-10-CM | POA: Diagnosis not present

## 2024-05-11 DIAGNOSIS — Z79899 Other long term (current) drug therapy: Secondary | ICD-10-CM | POA: Diagnosis not present

## 2024-05-16 DIAGNOSIS — I4811 Longstanding persistent atrial fibrillation: Secondary | ICD-10-CM | POA: Diagnosis not present

## 2024-05-16 DIAGNOSIS — S32592D Other specified fracture of left pubis, subsequent encounter for fracture with routine healing: Secondary | ICD-10-CM | POA: Diagnosis not present

## 2024-05-16 DIAGNOSIS — D500 Iron deficiency anemia secondary to blood loss (chronic): Secondary | ICD-10-CM | POA: Diagnosis not present

## 2024-05-16 DIAGNOSIS — S81802D Unspecified open wound, left lower leg, subsequent encounter: Secondary | ICD-10-CM | POA: Diagnosis not present

## 2024-05-16 DIAGNOSIS — I2510 Atherosclerotic heart disease of native coronary artery without angina pectoris: Secondary | ICD-10-CM | POA: Diagnosis not present

## 2024-05-16 DIAGNOSIS — I5032 Chronic diastolic (congestive) heart failure: Secondary | ICD-10-CM | POA: Diagnosis not present

## 2024-05-16 DIAGNOSIS — I495 Sick sinus syndrome: Secondary | ICD-10-CM | POA: Diagnosis not present

## 2024-05-16 DIAGNOSIS — S32591D Other specified fracture of right pubis, subsequent encounter for fracture with routine healing: Secondary | ICD-10-CM | POA: Diagnosis not present

## 2024-05-16 DIAGNOSIS — I110 Hypertensive heart disease with heart failure: Secondary | ICD-10-CM | POA: Diagnosis not present

## 2024-05-23 DIAGNOSIS — I2510 Atherosclerotic heart disease of native coronary artery without angina pectoris: Secondary | ICD-10-CM | POA: Diagnosis not present

## 2024-05-23 DIAGNOSIS — D500 Iron deficiency anemia secondary to blood loss (chronic): Secondary | ICD-10-CM | POA: Diagnosis not present

## 2024-05-23 DIAGNOSIS — I5032 Chronic diastolic (congestive) heart failure: Secondary | ICD-10-CM | POA: Diagnosis not present

## 2024-05-23 DIAGNOSIS — R011 Cardiac murmur, unspecified: Secondary | ICD-10-CM | POA: Diagnosis not present

## 2024-05-23 DIAGNOSIS — S81802D Unspecified open wound, left lower leg, subsequent encounter: Secondary | ICD-10-CM | POA: Diagnosis not present

## 2024-05-23 DIAGNOSIS — S32592D Other specified fracture of left pubis, subsequent encounter for fracture with routine healing: Secondary | ICD-10-CM | POA: Diagnosis not present

## 2024-05-23 DIAGNOSIS — R012 Other cardiac sounds: Secondary | ICD-10-CM | POA: Diagnosis not present

## 2024-05-23 DIAGNOSIS — I4811 Longstanding persistent atrial fibrillation: Secondary | ICD-10-CM | POA: Diagnosis not present

## 2024-05-23 DIAGNOSIS — I495 Sick sinus syndrome: Secondary | ICD-10-CM | POA: Diagnosis not present

## 2024-05-23 DIAGNOSIS — I110 Hypertensive heart disease with heart failure: Secondary | ICD-10-CM | POA: Diagnosis not present

## 2024-05-23 DIAGNOSIS — S32591D Other specified fracture of right pubis, subsequent encounter for fracture with routine healing: Secondary | ICD-10-CM | POA: Diagnosis not present

## 2024-05-29 DIAGNOSIS — I70223 Atherosclerosis of native arteries of extremities with rest pain, bilateral legs: Secondary | ICD-10-CM | POA: Diagnosis not present

## 2024-05-29 DIAGNOSIS — E782 Mixed hyperlipidemia: Secondary | ICD-10-CM | POA: Diagnosis not present

## 2024-05-29 DIAGNOSIS — E559 Vitamin D deficiency, unspecified: Secondary | ICD-10-CM | POA: Diagnosis not present

## 2024-05-29 DIAGNOSIS — R296 Repeated falls: Secondary | ICD-10-CM | POA: Diagnosis not present

## 2024-05-29 DIAGNOSIS — E038 Other specified hypothyroidism: Secondary | ICD-10-CM | POA: Diagnosis not present

## 2024-05-29 DIAGNOSIS — R0989 Other specified symptoms and signs involving the circulatory and respiratory systems: Secondary | ICD-10-CM | POA: Diagnosis not present

## 2024-05-29 DIAGNOSIS — D519 Vitamin B12 deficiency anemia, unspecified: Secondary | ICD-10-CM | POA: Diagnosis not present

## 2024-05-29 DIAGNOSIS — I77811 Abdominal aortic ectasia: Secondary | ICD-10-CM | POA: Diagnosis not present

## 2024-05-29 DIAGNOSIS — I2510 Atherosclerotic heart disease of native coronary artery without angina pectoris: Secondary | ICD-10-CM | POA: Diagnosis not present

## 2024-06-01 DIAGNOSIS — G3184 Mild cognitive impairment, so stated: Secondary | ICD-10-CM | POA: Diagnosis not present

## 2024-06-10 ENCOUNTER — Other Ambulatory Visit: Payer: Self-pay | Admitting: Cardiology

## 2024-06-12 NOTE — Telephone Encounter (Signed)
 Patient need make an appointment with Shl[facility] Corning for further refills.  [phone]
